# Patient Record
Sex: Male | Born: 1960 | Hispanic: No | Marital: Single | Smoking: Never smoker
Health system: Southern US, Community
[De-identification: ages and names within clinical notes are randomized; demographics above are authoritative.]

## PROBLEM LIST (undated history)

## (undated) DIAGNOSIS — I519 Heart disease, unspecified: Secondary | ICD-10-CM

## (undated) DIAGNOSIS — I10 Essential (primary) hypertension: Secondary | ICD-10-CM

## (undated) DIAGNOSIS — M1990 Unspecified osteoarthritis, unspecified site: Secondary | ICD-10-CM

## (undated) DIAGNOSIS — E119 Type 2 diabetes mellitus without complications: Secondary | ICD-10-CM

## (undated) DIAGNOSIS — I5032 Chronic diastolic (congestive) heart failure: Secondary | ICD-10-CM

## (undated) DIAGNOSIS — Z87440 Personal history of urinary (tract) infections: Secondary | ICD-10-CM

## (undated) DIAGNOSIS — F0390 Unspecified dementia without behavioral disturbance: Secondary | ICD-10-CM

## (undated) HISTORY — DX: Unspecified osteoarthritis, unspecified site: M19.90

## (undated) HISTORY — DX: Heart disease, unspecified: I51.9

## (undated) HISTORY — PX: CORONARY ARTERY BYPASS GRAFT: SHX141

---

## 2020-12-01 ENCOUNTER — Other Ambulatory Visit: Payer: Self-pay

## 2020-12-01 ENCOUNTER — Ambulatory Visit
Admission: EM | Admit: 2020-12-01 | Discharge: 2020-12-01 | Disposition: A | Discharge status: Home / Self Care | Payer: BC Managed Care – PPO

## 2020-12-01 ENCOUNTER — Encounter: Payer: Self-pay | Admitting: Emergency Medicine

## 2020-12-01 ENCOUNTER — Encounter (HOSPITAL_BASED_OUTPATIENT_CLINIC_OR_DEPARTMENT_OTHER): Payer: Self-pay | Admitting: *Deleted

## 2020-12-01 ENCOUNTER — Emergency Department (HOSPITAL_BASED_OUTPATIENT_CLINIC_OR_DEPARTMENT_OTHER)
Admission: EM | Admit: 2020-12-01 | Discharge: 2020-12-01 | Disposition: A | Discharge status: Home / Self Care | Payer: BC Managed Care – PPO | Attending: Emergency Medicine | Admitting: Emergency Medicine

## 2020-12-01 DIAGNOSIS — N4889 Other specified disorders of penis: Secondary | ICD-10-CM

## 2020-12-01 DIAGNOSIS — E1165 Type 2 diabetes mellitus with hyperglycemia: Secondary | ICD-10-CM | POA: Insufficient documentation

## 2020-12-01 DIAGNOSIS — Z951 Presence of aortocoronary bypass graft: Secondary | ICD-10-CM | POA: Diagnosis not present

## 2020-12-01 DIAGNOSIS — N471 Phimosis: Secondary | ICD-10-CM | POA: Diagnosis not present

## 2020-12-01 DIAGNOSIS — I10 Essential (primary) hypertension: Secondary | ICD-10-CM | POA: Insufficient documentation

## 2020-12-01 DIAGNOSIS — R739 Hyperglycemia, unspecified: Secondary | ICD-10-CM

## 2020-12-01 DIAGNOSIS — R369 Urethral discharge, unspecified: Secondary | ICD-10-CM | POA: Diagnosis present

## 2020-12-01 DIAGNOSIS — N481 Balanitis: Secondary | ICD-10-CM

## 2020-12-01 HISTORY — DX: Type 2 diabetes mellitus without complications: E11.9

## 2020-12-01 HISTORY — DX: Essential (primary) hypertension: I10

## 2020-12-01 LAB — I-STAT VENOUS BLOOD GAS, ED
Acid-Base Excess: 2 mmol/L (ref 0.0–2.0)
Bicarbonate: 28.1 mmol/L — ABNORMAL HIGH (ref 20.0–28.0)
Calcium, Ion: 1.28 mmol/L (ref 1.15–1.40)
HCT: 40 % (ref 39.0–52.0)
Hemoglobin: 13.6 g/dL (ref 13.0–17.0)
O2 Saturation: 33 %
Potassium: 4.4 mmol/L (ref 3.5–5.1)
Sodium: 132 mmol/L — ABNORMAL LOW (ref 135–145)
TCO2: 30 mmol/L (ref 22–32)
pCO2, Ven: 47.7 mmHg (ref 44.0–60.0)
pH, Ven: 7.379 (ref 7.250–7.430)
pO2, Ven: 21 mmHg — CL (ref 32.0–45.0)

## 2020-12-01 LAB — URINALYSIS, ROUTINE W REFLEX MICROSCOPIC
Bilirubin Urine: NEGATIVE
Glucose, UA: >1000 mg/dL — AB
Hgb urine dipstick: NEGATIVE
Ketones, ur: 40 mg/dL — AB
Nitrite: NEGATIVE
Protein, ur: NEGATIVE mg/dL
Specific Gravity, Urine: 1.037 — ABNORMAL HIGH (ref 1.005–1.030)
pH: 5.5 (ref 5.0–8.0)

## 2020-12-01 LAB — BASIC METABOLIC PANEL
Anion gap: 16 — ABNORMAL HIGH (ref 5–15)
BUN: 16 mg/dL (ref 6–20)
CO2: 24 mmol/L (ref 22–32)
Calcium: 10.3 mg/dL (ref 8.9–10.3)
Chloride: 91 mmol/L — ABNORMAL LOW (ref 98–111)
Creatinine, Ser: 0.96 mg/dL (ref 0.61–1.24)
GFR, Estimated: >60 mL/min (ref >60)
Glucose, Bld: 682 mg/dL (ref 70–99)
Potassium: 4.3 mmol/L (ref 3.5–5.1)
Sodium: 131 mmol/L — ABNORMAL LOW (ref 135–145)

## 2020-12-01 LAB — CBC
HCT: 39.4 % (ref 39.0–52.0)
Hemoglobin: 14 g/dL (ref 13.0–17.0)
MCH: 31.9 pg (ref 26.0–34.0)
MCHC: 35.5 g/dL (ref 30.0–36.0)
MCV: 89.7 fL (ref 80.0–100.0)
Platelets: 205 10*3/uL (ref 150–400)
RBC: 4.39 MIL/uL (ref 4.22–5.81)
RDW: 11.8 % (ref 11.5–15.5)
WBC: 6.7 10*3/uL (ref 4.0–10.5)
nRBC: 0 % (ref 0.0–0.2)

## 2020-12-01 LAB — CBG MONITORING, ED
Glucose-Capillary: >600 mg/dL (ref 70–99)
Glucose-Capillary: 557 mg/dL (ref 70–99)
Glucose-Capillary: 449 mg/dL — ABNORMAL HIGH (ref 70–99)
Glucose-Capillary: 385 mg/dL — ABNORMAL HIGH (ref 70–99)
Glucose-Capillary: 233 mg/dL — ABNORMAL HIGH (ref 70–99)
Glucose-Capillary: 169 mg/dL — ABNORMAL HIGH (ref 70–99)
Glucose-Capillary: 176 mg/dL — ABNORMAL HIGH (ref 70–99)
Glucose-Capillary: 301 mg/dL — ABNORMAL HIGH (ref 70–99)
Glucose-Capillary: 167 mg/dL — ABNORMAL HIGH (ref 70–99)

## 2020-12-01 MED ORDER — MUPIROCIN CALCIUM 2 % EX CREA: 1.0000 "application " | TOPICAL_CREAM | Freq: Two times a day (BID) | CUTANEOUS | 0 refills | Status: DC

## 2020-12-01 MED ORDER — DEXTROSE IN LACTATED RINGERS 5 % IV SOLN: INTRAVENOUS | Status: DC

## 2020-12-01 MED ORDER — DEXTROSE 50 % IV SOLN: 0.0000 mL | INTRAVENOUS | Status: DC | PRN

## 2020-12-01 MED ORDER — INSULIN REGULAR(HUMAN) IN NACL 100-0.9 UT/100ML-% IV SOLN
INTRAVENOUS | Status: DC
  Administered 2020-12-01: 11.5 [IU]/h via INTRAVENOUS
  Filled 2020-12-01: qty 100

## 2020-12-01 MED ORDER — LACTATED RINGERS IV BOLUS
20.0000 mL/kg | Freq: Once | INTRAVENOUS | Status: AC
  Administered 2020-12-01: 1070 mL via INTRAVENOUS

## 2020-12-01 MED ORDER — LACTATED RINGERS IV SOLN: INTRAVENOUS | Status: DC

## 2020-12-01 MED ORDER — INSULIN REGULAR HUMAN 100 UNIT/ML IJ SOLN
5.0000 [IU] | Freq: Once | INTRAMUSCULAR | Status: DC
  Filled 2020-12-01: qty 3

## 2020-12-01 MED ORDER — INSULIN ASPART PROT & ASPART (70-30 MIX) 100 UNIT/ML ~~LOC~~ SUSP
5.0000 [IU] | Freq: Once | SUBCUTANEOUS | Status: AC
  Administered 2020-12-01: 5 [IU] via SUBCUTANEOUS

## 2020-12-01 MED ORDER — NYSTATIN 100000 UNIT/GM EX CREA: TOPICAL_CREAM | CUTANEOUS | 0 refills | Status: DC

## 2020-12-01 MED ORDER — FLUCONAZOLE 150 MG PO TABS
150.0000 mg | ORAL_TABLET | Freq: Once | ORAL | Status: AC
  Administered 2020-12-01: 150 mg via ORAL
  Filled 2020-12-01: qty 1

## 2020-12-01 NOTE — ED Provider Notes (Signed)
Chief Complaint   Chief Complaint  Patient presents with   Rash     Subjective, HPI  Charles [name] is a very pleasant 60 y.o. male who presents with rash to groin region that started about 2 months ago.  Patient has reported a milky substance from his foreskin.  Patient states that he went to the pharmacy and was told that this was related to his diabetes and to follow-up with a provider.  Patient reports that he is new to the area and has not established himself with a PCP.  No fever, chills, vomiting.  Patient reports taking himself off of all of his medications.  History obtained from patient.   Patient's problem list, past medical and social history, medications, and allergies were reviewed by me and updated in Epic.    ROS  See HPI.  Objective   Vitals:   12/01/20 1114  BP: 127/80  Pulse: 98  Resp: (!) 22  Temp: 99.1 F (37.3 C)  SpO2: 100%    Vital signs and nursing note reviewed.  General: Appears well-developed and well-nourished. No acute distress.  HEENT: Normocephalic, atraumatic, hearing grossly intact. EOMI, no drainage. Neck: Normal range of motion, neck is supple.  Cardiovascular: Normal rate.  Pulm/Chest: No respiratory distress.   Musculoskeletal: No joint deformity, normal range of motion.  Skin: No rash noted to groin region or penis. GU: Head of penis severely enlarged and unable to retract foreskin.  There is a white creamy drainage to foreskin region.  No tenderness to palpation of head of penis, scrotum, testicles.  Data  No results found for any visits on 12/01/20.   Assessment & Plan  1. Penile swelling  2. Penile pain  60 y.o. male presents with rash to groin region that started about 2 months ago.  Patient has reported a milky substance from his foreskin.  Patient states that he went to the pharmacy and was told that this was related to his diabetes and to follow-up with a provider.  Patient reports that he is new to the area and has not  established himself with a PCP.  No fever, chills, vomiting.  Patient reports taking himself off of all of his medications.  Given appearance of penis, there is concern that the patient will need further work-up in the emergency department and imaging.  Did explain to the patient that with the swelling of the head of his penis there is concerned that at some point he may have issues with urination if he does not get this area evaluated.  Patient reports that he will present to the emergency department.  Patient is new to the area, so did establish with PCP in clinic today and will be seen on October 13th at 9:50 am.  Stable on discharge.  Return as needed.  Patient verbalized understanding and agreed with plan.  Plan:   Discharge Instructions      Your current condition warrants further evaluation and/or treatment which exceed services available to you in this urgent care setting. I have discussed with you your currrent condition and the need for further evaluation and/or treatment in an emergency department setting. In response to my medical recommendation, you have opted to go to the emergency department.         Amalia Greenhouse, [place] 12/01/20 1203

## 2020-12-01 NOTE — ED Triage Notes (Signed)
Pt sent from UC in [place] due to discharge from foreskin, was told to come here, Pt is diabetic uncontrolled, he thinks all of this is related.

## 2020-12-01 NOTE — ED Notes (Signed)
BS 301 was made in error; BS done on side of IV fluids (D5%) with fluids running.  Lawsing MD aware.

## 2020-12-01 NOTE — ED Provider Notes (Signed)
   MDM    60 male DM2, stopped taking medications 6 months ago. Here with balanitis and phimosis. Also hyperglycemic.  The patient has been off his oral intake hyperglycemic medications and injections for the past 6 months.  He denies any fever or chills.  His only infectious symptoms are a concern for balanitis with penile discharge.  Please see the prior ED provider's note for previous MDM.  The patient initially presented hyperglycemic with blood sugars in the 600s, no evidence of DKA, improved on an insulin drip and following IV fluids.  Plan at time of signout to reassess the patient following glycemic control, discharged with plan for treatment for balanitis.  The patient was successfully weaned off of his insulin GTT and transitioned over to subcutaneous insulin.  He was tolerating oral intake and had a meal with no return in his hyperglycemia.  Blood glucose on recheck was 167.  I strongly encouraged the patient to follow-up with a primary physician to reestablish care and restart his home insulin regimen outpatient.  Provided prescription for Bactroban and nystatin cream in the setting of his balanitis.  Overall stable for discharge home.       Ernie Avena, MD 12/01/20 2009

## 2020-12-01 NOTE — Discharge Instructions (Addendum)
You need to follow-up with a primary physician to initiate insulin therapy given your uncontrolled blood sugar today.

## 2020-12-01 NOTE — ED Triage Notes (Signed)
Noticed rash in private area 2 months ago, didn't think much about it.  Then noticed a milky substance from foreskin.  Patient did go to a pharmacy and was told this related to diabetes and told to see a doctor.  Patient is new to area.  Has not established pcp.    Patient has concerns for balance.  Noticed the balance issues 6 months ago.  Walks with a cane.

## 2020-12-01 NOTE — ED Notes (Signed)
At bedside for kristin, np exam of patient

## 2020-12-01 NOTE — ED Notes (Signed)
Pt requested recheck of BS for 2nd glucose.

## 2020-12-01 NOTE — Discharge Instructions (Addendum)
Your current condition warrants further evaluation and/or treatment which exceed services available to you in this urgent care setting. I have discussed with you your currrent condition and the need for further evaluation and/or treatment in an emergency department setting. In response to my medical recommendation, you have opted to go to the emergency department. 

## 2020-12-01 NOTE — ED Provider Notes (Signed)
[facility] EMERGENCY DEPT Provider Note   CSN: [id number] Arrival date & time: 12/01/20  1232     History Chief Complaint  Patient presents with   Penile Discharge   Hyperglycemia    Charles [name] is a 60 y.o. male.   Penile Discharge  Hyperglycemia  States he has been having some issues with a white discharge from the foreskin of his penis.  It is very itchy.  This has been going on for couple weeks now.  He is not having any pain.  No fevers or chills.  Patient went to an urgent care who thought he should be evaluated in the ED.  They noted that they were unable to retract his foreskin properly.  Patient states he does have history of diabetes.  He has not been taking any of his medications for at least the last 6 months.  Patient used to be on oral medications and injections.  Patient denies any trouble with fevers or chills.  No vomiting or diarrhea.  He has noticed polyuria polydipsia.  He has also lost weight in these last several months  Past Medical History:  Diagnosis Date   Diabetes mellitus without complication (HCC)    Hypertension     There are no problems to display for this patient.   Past Surgical History:  Procedure Laterality Date   CORONARY ARTERY BYPASS GRAFT         No family history on file.  Social History   Tobacco Use   Smoking status: Never   Smokeless tobacco: Never  Vaping Use   Vaping Use: Never used  Substance Use Topics   Alcohol use: Not Currently   Drug use: Never    Home Medications Prior to Admission medications   Medication Sig Start Date End Date Taking? Authorizing Provider  mupirocin cream (BACTROBAN) 2 % Apply 1 application topically 2 (two) times daily. Apply twice daily to the penis 12/01/20  Yes Ernie Avena, MD  nystatin cream (MYCOSTATIN) Apply to affected area 2 times daily to the penis 12/01/20  Yes Ernie Avena, MD    Allergies    Patient has no known allergies.  Review of Systems    Review of Systems  Genitourinary:  Positive for penile discharge.  All other systems reviewed and are negative.  Physical Exam Updated Vital Signs BP 114/70   Pulse 78   Temp 98.3 F (36.8 C) (Oral)   Resp 18   Ht 1.676 m (5\' 6" )   Wt 53.5 kg   SpO2 98%   BMI 19.05 kg/m   Physical Exam Vitals and nursing note reviewed.  Constitutional:      General: He is not in acute distress.    Appearance: He is well-developed.  HENT:     Head: Normocephalic and atraumatic.     Right Ear: External ear normal.     Left Ear: External ear normal.  Eyes:     General: No scleral icterus.       Right eye: No discharge.        Left eye: No discharge.     Conjunctiva/sclera: Conjunctivae normal.  Neck:     Trachea: No tracheal deviation.  Cardiovascular:     Rate and Rhythm: Normal rate and regular rhythm.  Pulmonary:     Effort: Pulmonary effort is normal. No respiratory distress.     Breath sounds: Normal breath sounds. No stridor. No wheezing or rales.  Abdominal:     General: Bowel sounds are normal. There  is no distension.     Palpations: Abdomen is soft.     Tenderness: There is no abdominal tenderness. There is no guarding or rebound.  Genitourinary:    Comments: White discharge noted around the glans of the penis and the foreskin, no erythema, no induration, unable to retract foreskin properly Musculoskeletal:        General: No tenderness or deformity.     Cervical back: Neck supple.  Skin:    General: Skin is warm and dry.     Findings: No rash.  Neurological:     General: No focal deficit present.     Mental Status: He is alert.     Cranial Nerves: No cranial nerve deficit (no facial droop, extraocular movements intact, no slurred speech).     Sensory: No sensory deficit.     Motor: No abnormal muscle tone or seizure activity.     Coordination: Coordination normal.  Psychiatric:        Mood and Affect: Mood normal.    ED Results / Procedures / Treatments    Labs (all labs ordered are listed, but only abnormal results are displayed) Labs Reviewed  BASIC METABOLIC PANEL - Abnormal; Notable for the following components:      Result Value   Sodium 131 (*)    Chloride 91 (*)    Glucose, Bld 682 (*)    Anion gap 16 (*)    All other components within normal limits  URINALYSIS, ROUTINE W REFLEX MICROSCOPIC - Abnormal; Notable for the following components:   Color, Urine COLORLESS (*)    Specific Gravity, Urine 1.037 (*)    Glucose, UA >1,000 (*)    Ketones, ur 40 (*)    Leukocytes,Ua LARGE (*)    All other components within normal limits  CBG MONITORING, ED - Abnormal; Notable for the following components:   Glucose-Capillary >600 (*)    All other components within normal limits  CBG MONITORING, ED - Abnormal; Notable for the following components:   Glucose-Capillary 557 (*)    All other components within normal limits  I-STAT VENOUS BLOOD GAS, ED - Abnormal; Notable for the following components:   pO2, Ven 21.0 (*)    Bicarbonate 28.1 (*)    Sodium 132 (*)    All other components within normal limits  CBG MONITORING, ED - Abnormal; Notable for the following components:   Glucose-Capillary 449 (*)    All other components within normal limits  CBG MONITORING, ED - Abnormal; Notable for the following components:   Glucose-Capillary 385 (*)    All other components within normal limits  CBC  CBG MONITORING, ED    EKG None  Radiology No results found.  Procedures Procedures   Medications Ordered in ED Medications  insulin regular, human (MYXREDLIN) 100 units/ 100 mL infusion (10 Units/hr Intravenous Rate/Dose Change 12/01/20 1541)  dextrose 50 % solution 0-50 mL (has no administration in time range)  lactated ringers infusion ( Intravenous New Bag/Given 12/01/20 1524)  lactated ringers bolus 1,070 mL (0 mLs Intravenous Stopped 12/01/20 1502)  fluconazole (DIFLUCAN) tablet 150 mg (150 mg Oral Given 12/01/20 1346)    ED Course  I  have reviewed the triage vital signs and the nursing notes.  Pertinent labs & imaging results that were available during my care of the patient were reviewed by me and considered in my medical decision making (see chart for details).  Clinical Course as of 12/01/20 1636  Wed Dec 01, 2020  1450 Venous  blood gas without signs of acidosis [JK]    Clinical Course User Index [JK] Linwood Dibbles, MD   MDM Rules/Calculators/A&P                           Pt with symptomatic hyperglycemia.  Labs without signs of acidosis.  Pt alert and oriented.  Doubt DKA, hyperosmolar syndrome.  Balanitis and phimosis on exam.  Plan on treatment with antifungal.  Stressed importance of taking his diabetes meds.  Insulin and IV fluids to treat his hyperglycemia. Improving with treatment.  Plan on outpatient management.  UA pending.  Care turned over to Dr Karene Fry Final Clinical Impression(s) / ED Diagnoses Final diagnoses:  Balanitis  Phimosis of penis  Hyperglycemia    Rx / DC Orders ED Discharge Orders          Ordered    mupirocin cream (BACTROBAN) 2 %  2 times daily        12/01/20 1612    nystatin cream (MYCOSTATIN)        12/01/20 1612             Linwood Dibbles, MD 12/01/20 1636

## 2020-12-01 NOTE — ED Notes (Signed)
Oral intake given per v.o. Lawsing MD.

## 2020-12-01 NOTE — ED Notes (Signed)
Urinal given; pt aware awaiting UA for lab work.

## 2020-12-23 ENCOUNTER — Other Ambulatory Visit: Payer: Self-pay

## 2020-12-23 ENCOUNTER — Ambulatory Visit (INDEPENDENT_AMBULATORY_CARE_PROVIDER_SITE_OTHER): Payer: Managed Care, Other (non HMO) | Admitting: Family Medicine

## 2020-12-23 VITALS — BP 112/7 | HR 104 | Ht 66.0 in | Wt 115.6 lb

## 2020-12-23 DIAGNOSIS — E1165 Type 2 diabetes mellitus with hyperglycemia: Secondary | ICD-10-CM

## 2020-12-23 DIAGNOSIS — Z1211 Encounter for screening for malignant neoplasm of colon: Secondary | ICD-10-CM

## 2020-12-23 DIAGNOSIS — I10 Essential (primary) hypertension: Secondary | ICD-10-CM | POA: Diagnosis not present

## 2020-12-23 DIAGNOSIS — E119 Type 2 diabetes mellitus without complications: Secondary | ICD-10-CM | POA: Insufficient documentation

## 2020-12-23 DIAGNOSIS — I2510 Atherosclerotic heart disease of native coronary artery without angina pectoris: Secondary | ICD-10-CM | POA: Diagnosis not present

## 2020-12-23 MED ORDER — BLOOD GLUCOSE METER KIT
PACK | 11 refills | Status: AC
Start: 2020-12-23 — End: ?

## 2020-12-23 MED ORDER — METFORMIN HCL ER 500 MG PO TB24: 1000.0000 mg | ORAL_TABLET | Freq: Every day | ORAL | 2 refills | Status: DC

## 2020-12-23 NOTE — Progress Notes (Signed)
New Patient Office Visit  Subjective:  Patient ID: Charles [name], male    DOB: [birth year]  Age: 60 y.o. MRN: [id number]  CC:  Chief Complaint  Patient presents with   Establish Care    Prior PCP - [place] [facility]   Hypertension    Patient has longstanding hx of HTN. He states been out of all his medication for about a year and a half due to losing employment and insurance. He has not ought any medical care in the interim.    Diabetes    Patient has longstanding hx of DM. He states been out of all his medication for about a year and a half due to losing employment and insurance. He has not ought any medical care in the interim.He was on a short acting and long acting insulin in the past but states at one point he was switched to metformin ER. He is unaware of any of the most recent medications he was prescribed.     HPI Charles [name] is a 60 year old male presenting to establish clinic.  He has current concerns as outlined above.  Past medical history significant for diabetes, hypertension, CAD status post bypass surgery in 2003.  Hypertension: Unsure of any medications he had been on in the past.  Denies any issues currently with chest pain, headaches, lightheadedness or dizziness  Diabetes: Reports being managed on various medication in the past including short acting and long-acting insulin.  Has not taken any medications for about the past year and a half.  Did present to the emergency department recently and was diagnosed with balanitis which is likely due to uncontrolled blood sugars as he was found to have significantly elevated blood sugar in the emergency department and was placed on insulin drip.  Was not diagnosed as DKA.  After the emergency room visit, he did resume low-dose of metformin that he had at home.  CAD: Reports having bypass surgery in 2003.  Denies any chest pain at the time, but was having progressively worsening exertional shortness of breath and thus had  catheterization done and found to have significant blockages in coronary arteries.  Patient is currently not working.  He indicates plans to try to obtain disability.  Past Medical History:  Diagnosis Date   Diabetes mellitus without complication ([place])    Hypertension     Past Surgical History:  Procedure Laterality Date   CORONARY ARTERY BYPASS GRAFT      No family history on file.  Social History   Socioeconomic History   Marital status: Single    Spouse name: Not on file   Number of children: Not on file   Years of education: Not on file   Highest education level: Not on file  Occupational History   Not on file  Tobacco Use   Smoking status: Never   Smokeless tobacco: Never  Vaping Use   Vaping Use: Never used  Substance and Sexual Activity   Alcohol use: Not Currently   Drug use: Never   Sexual activity: Not on file  Other Topics Concern   Not on file  Social History Narrative   Not on file   Social Determinants of Health   Financial Resource Strain: Not on file  Food Insecurity: Not on file  Transportation Needs: Not on file  Physical Activity: Not on file  Stress: Not on file  Social Connections: Not on file  Intimate Partner Violence: Not on file    Objective:   Today's Vitals:  BP (!) 112/7   Pulse (!) 104   Ht $R'5\' 6"'Ed$  (1.676 m)   Wt 115 lb 9.6 oz (52.4 kg)   SpO2 100%   BMI 18.66 kg/m   Physical Exam  60 year old male in no acute distress Cardiovascular exam with regular rate and rhythm Lungs clear to auscultation bilaterally  Assessment & Plan:   Problem List Items Addressed This Visit       Cardiovascular and Mediastinum   Hypertension    Blood pressure within normal range in office today Patient has had notable weight loss in recent time, suspect this is due to poorly controlled diabetes Likely given underlying CAD, diabetes, patient may benefit from addition of agents such as ARB.  We will need to check labs, monitor blood  pressure once medications are started No additional blood pressure medications to be started today Recommend checking blood pressure at home, adhering to DASH diet      Relevant Orders   Ambulatory referral to Cardiology   Basic metabolic panel (Completed)   Lipid panel (Completed)   CAD (coronary artery disease)    History of quadruple bypass surgery in 2003 Has been off of medications for about 1-1/2 years, cannot recall medications that he supposed to be on At time of diagnosis requiring bypass surgery in the past, he was not having any chest pain, but was having progressive dyspnea, notably with exertion He is not currently having any issues with chest pain, however is having some shortness of breath with exertion over the past year Given patient's medical history, lack of appropriate medical care, current symptoms, will refer to cardiology for further evaluation and management      Relevant Orders   Ambulatory referral to Cardiology   Basic metabolic panel (Completed)   Lipid panel (Completed)     Endocrine   Diabetes ([place])    Has been without medications for a year and a half Clinic Emergency Department for issues of balanitis and findings of significantly elevated blood sugars without diagnosis of DKA Did resume low-dose of metformin the patient had at home We will titrate dose of metformin today Check labs today Likely will need to be placed on additional medication, consider insulin glargine pending results of A1c Will also refer to endocrinology      Relevant Medications   metFORMIN (GLUCOPHAGE-XR) 500 MG 24 hr tablet   blood glucose meter kit and supplies   Other Relevant Orders   Ambulatory referral to Endocrinology   Hemoglobin A1c (Completed)   Other Visit Diagnoses     Colon cancer screening    -  Primary   Relevant Orders   Ambulatory referral to Gastroenterology     Patient has had some falls in the past year.  Will need to continue to assess, consider  referral to physical therapy in the future for assessment and management of gait and stability  Outpatient Encounter Medications as of 12/23/2020  Medication Sig   blood glucose meter kit and supplies Dispense based on patient and insurance preference. Use up to four times daily as directed. (FOR ICD-10 E10.9, E11.9).   metFORMIN (GLUCOPHAGE-XR) 500 MG 24 hr tablet Take 2 tablets (1,000 mg total) by mouth daily with breakfast.   mupirocin cream (BACTROBAN) 2 % Apply 1 application topically 2 (two) times daily. Apply twice daily to the penis   nystatin cream (MYCOSTATIN) Apply to affected area 2 times daily to the penis   No facility-administered encounter medications on file as of 12/23/2020.   Spent  45 minutes on this patient encounter, including preparation, chart review, face-to-face counseling with patient and coordination of care, and documentation of encounter  Follow-up: Return in about 2 weeks (around 01/06/2021) for Follow Up.  Plan for close follow-up due to need for adequate control of chronic medical issues in order to lower risk of complication from uncontrolled conditions  Gabriella Guile J [place], MD

## 2020-12-23 NOTE — Patient Instructions (Addendum)
  Medication Instructions:  Your physician recommends that you continue on your current medications as directed. Please refer to the Current Medication list given to you today. --If you need a refill on any your medications before your next appointment, please call your pharmacy first. If no refills are authorized on file call the office.-- Lab Work: Your physician has recommended that you have lab work today: BMP, Lipid, And A1C If you have labs (blood work) drawn today and your tests are completely normal, you will receive your results via MyChart message OR a phone call from our staff.  Please ensure you check your voicemail in the event that you authorized detailed messages to be left on a delegated number. If you have any lab test that is abnormal or we need to change your treatment, we will call you to review the results.  Referrals/Procedures/Imaging: A referral has been placed for you to [facility] for evaluation and treatment. Someone from the scheduling department will be in contact with you in regards to coordinating your consultation. If you do not hear from any of the schedulers within 7-10 business days please give their office a call.  A referral has been placed for you to [facility] for a colonoscopy. Someone from the scheduling department will be in contact with you in regards to coordinating your consultation. If you do not hear from any of the schedulers within 7-10 business days please give their office a call.  A referral has been placed for you to Endocrinology for evaluation and treatment. Someone from the scheduling department will be in contact with you in regards to coordinating your consultation. If you do not hear from any of the schedulers within 7-10 business days please give their office a call.  Follow-Up: Your next appointment:   Your physician recommends that you schedule a follow-up appointment in: 3-4 WEEKS with Dr. de [place]  You will receive a text  message or e-mail with a link to a survey about your care and experience with [place] today! We would greatly appreciate your feedback!   Thanks for letting us be apart of your health journey!!  Primary Care and Sports Medicine   Dr. Ceasar Mons [place]   We encourage you to activate your patient portal called "MyChart".  Sign up information is provided on this After Visit Summary.  MyChart is used to connect with patients for Virtual Visits (Telemedicine).  Patients are able to view lab/test results, encounter notes, upcoming appointments, etc.  Non-urgent messages can be sent to your provider as well. To learn more about what you can do with MyChart, please visit --  [email or web address].

## 2020-12-24 LAB — BASIC METABOLIC PANEL
BUN/Creatinine Ratio: 16 (ref 9–20)
BUN: 14 mg/dL (ref 6–24)
CO2: 14 mmol/L — ABNORMAL LOW (ref 20–29)
Calcium: 9.5 mg/dL (ref 8.7–10.2)
Chloride: 98 mmol/L (ref 96–106)
Creatinine, Ser: 0.9 mg/dL (ref 0.76–1.27)
Glucose: 360 mg/dL — ABNORMAL HIGH (ref 70–99)
Potassium: 4.6 mmol/L (ref 3.5–5.2)
Sodium: 135 mmol/L (ref 134–144)
eGFR: 100 mL/min/{1.73_m2} (ref >59)

## 2020-12-24 LAB — LIPID PANEL
Chol/HDL Ratio: 7.1 ratio — ABNORMAL HIGH (ref 0.0–5.0)
Cholesterol, Total: 328 mg/dL — ABNORMAL HIGH (ref 100–199)
HDL: 46 mg/dL (ref >39)
LDL Chol Calc (NIH): 231 mg/dL — ABNORMAL HIGH (ref 0–99)
Triglycerides: 251 mg/dL — ABNORMAL HIGH (ref 0–149)
VLDL Cholesterol Cal: 51 mg/dL — ABNORMAL HIGH (ref 5–40)

## 2020-12-24 LAB — HEMOGLOBIN A1C
Est. average glucose Bld gHb Est-mCnc: >398 mg/dL
Hgb A1c MFr Bld: >15.5 % — ABNORMAL HIGH (ref 4.8–5.6)

## 2020-12-24 NOTE — Assessment & Plan Note (Signed)
Blood pressure within normal range in office today Patient has had notable weight loss in recent time, suspect this is due to poorly controlled diabetes Likely given underlying CAD, diabetes, patient may benefit from addition of agents such as ARB.  We will need to check labs, monitor blood pressure once medications are started No additional blood pressure medications to be started today Recommend checking blood pressure at home, adhering to DASH diet

## 2020-12-24 NOTE — Assessment & Plan Note (Addendum)
Has been without medications for a year and a half Clinic Emergency Department for issues of balanitis and findings of significantly elevated blood sugars without diagnosis of DKA Did resume low-dose of metformin the patient had at home We will titrate dose of metformin today Check labs today Likely will need to be placed on additional medication, consider insulin glargine pending results of A1c Will also refer to endocrinology

## 2020-12-24 NOTE — Assessment & Plan Note (Signed)
History of quadruple bypass surgery in 2003 Has been off of medications for about 1-1/2 years, cannot recall medications that he supposed to be on At time of diagnosis requiring bypass surgery in the past, he was not having any chest pain, but was having progressive dyspnea, notably with exertion He is not currently having any issues with chest pain, however is having some shortness of breath with exertion over the past year Given patient's medical history, lack of appropriate medical care, current symptoms, will refer to cardiology for further evaluation and management

## 2020-12-27 ENCOUNTER — Telehealth (HOSPITAL_BASED_OUTPATIENT_CLINIC_OR_DEPARTMENT_OTHER): Payer: Self-pay

## 2020-12-27 MED ORDER — BASAGLAR KWIKPEN 100 UNIT/ML ~~LOC~~ SOPN: 10.0000 [IU] | PEN_INJECTOR | Freq: Every day | SUBCUTANEOUS | 1 refills | Status: DC

## 2020-12-27 NOTE — Telephone Encounter (Signed)
Results released by Dr. de Peru and reviewed by patient via MyChart Instructed patient to contact the office with any questions or concerns.  MyChart Message sent to patient informing patient that insulin has been sent and instructions on how to titrate insulin until blood sugars are optimized

## 2020-12-27 NOTE — Telephone Encounter (Signed)
-----   Message from Hosie Poisson Peru, MD sent at 12/24/2020  2:13 PM EDT ----- Blood sugar elevated on labs.  Kidney function and electrolytes otherwise normal.  Cholesterol panel with notable elevation in total cholesterol as well as "bad" cholesterol.  This will require treatment with statin medication to help with lowering cholesterol.  Hemoglobin A1c significantly elevated.  This elevation indicates very high blood sugar readings on average over the past 3 months.  Due to this A1c level, would recommend initiating long-acting insulin.  This long-acting insulin can be taken once daily and recommend checking morning fasting blood sugar daily to monitor for improvement in fasting blood sugar readings with use of long-acting insulin.  We will begin with Lantus 10 units once daily and monitor response with fasting blood sugar readings.

## 2020-12-29 ENCOUNTER — Encounter: Payer: Self-pay | Admitting: Gastroenterology

## 2020-12-30 ENCOUNTER — Encounter (HOSPITAL_BASED_OUTPATIENT_CLINIC_OR_DEPARTMENT_OTHER): Payer: Self-pay | Admitting: Family Medicine

## 2021-01-05 ENCOUNTER — Encounter (HOSPITAL_BASED_OUTPATIENT_CLINIC_OR_DEPARTMENT_OTHER): Payer: Self-pay | Admitting: Family Medicine

## 2021-01-05 MED ORDER — BASAGLAR KWIKPEN 100 UNIT/ML ~~LOC~~ SOPN: 10.0000 [IU] | PEN_INJECTOR | Freq: Every day | SUBCUTANEOUS | 1 refills | Status: DC

## 2021-01-07 ENCOUNTER — Other Ambulatory Visit (HOSPITAL_BASED_OUTPATIENT_CLINIC_OR_DEPARTMENT_OTHER): Payer: Self-pay

## 2021-01-20 ENCOUNTER — Ambulatory Visit (HOSPITAL_BASED_OUTPATIENT_CLINIC_OR_DEPARTMENT_OTHER): Payer: Managed Care, Other (non HMO) | Admitting: Family Medicine

## 2021-01-21 ENCOUNTER — Encounter: Payer: Self-pay | Admitting: Gastroenterology

## 2021-01-21 ENCOUNTER — Ambulatory Visit (INDEPENDENT_AMBULATORY_CARE_PROVIDER_SITE_OTHER): Payer: Managed Care, Other (non HMO) | Admitting: Gastroenterology

## 2021-01-21 VITALS — BP 116/70 | HR 88 | Ht 66.0 in | Wt 131.5 lb

## 2021-01-21 DIAGNOSIS — R140 Abdominal distension (gaseous): Secondary | ICD-10-CM

## 2021-01-21 DIAGNOSIS — Z1211 Encounter for screening for malignant neoplasm of colon: Secondary | ICD-10-CM

## 2021-01-21 DIAGNOSIS — R634 Abnormal weight loss: Secondary | ICD-10-CM | POA: Diagnosis not present

## 2021-01-21 DIAGNOSIS — K649 Unspecified hemorrhoids: Secondary | ICD-10-CM | POA: Diagnosis not present

## 2021-01-21 DIAGNOSIS — Z1212 Encounter for screening for malignant neoplasm of rectum: Secondary | ICD-10-CM

## 2021-01-21 NOTE — Patient Instructions (Addendum)
If you are age 58 or older, your body mass index should be between 23-30. Your Body mass index is 21.22 kg/m. If this is out of the aforementioned range listed, please consider follow up with your Primary Care Provider.  If you are age 32 or younger, your body mass index should be between 19-25. Your Body mass index is 21.22 kg/m. If this is out of the aformentioned range listed, please consider follow up with your Primary Care Provider.   Try over the counter Align probiotic and Gas-X.  Follow up in February .   The [place] [facility] providers would like to encourage you to use [facility] to communicate with providers for non-urgent requests or questions.  Due to long hold times on the telephone, sending your provider a message by [facility] may be a faster and more efficient way to get a response.  Please allow 48 business hours for a response.  Please remember that this is for non-urgent requests.   It was a pleasure to see you today!  Thank you for trusting me with your gastrointestinal care!    Scott E.Tomasa Rand, MD

## 2021-01-21 NOTE — Progress Notes (Signed)
HPI : Charles [name] is a very pleasant 60 year old male with a history of coronary artery disease status post CABG and poorly controlled diabetes.  The patient had been without medical insurance and so had been removed from medical care on no medications for over a year and a half.  He presented to the ER in September with bowel otitis, and was found to have blood sugar in the 600s.  He was placed on insulin drip, but was not hospitalized and not felt to have DKA.  He reestablished care with a primary provider last month, and was found to have a hemoglobin A1c greater than 15.5.  He has been dealing with generalized weakness and shortness of breath, and was referred to cardiology for further evaluation. He reports chronic problems with abdominal discomfort and excessive gas.  This has been more bothersome in the past 2 to 3 months.  His bowel movements are fairly regular, with formed stools, sometimes hard and difficult to pass.  No problems with diarrhea.  He denies any changes in his diet accompanying the change in flatulence and abdominal discomfort.  He reports 45 pounds of unintentional weight loss over the last year.  Recently, he has gained back about 5 or 6 pounds since being back on diabetes medications. The patient is also very bothered by hemorrhoids.  He reports recurring episodes of perianal swelling and discomfort, described as a dull pressure sensation with palpable swelling/lumps in the area.  He will sometimes see blood on the toilet paper.  He denies severe pain with the passage of stool.  Denies symptoms of prolapsing hemorrhoids.  He takes an over-the-counter fiber powder, but if he takes too much, this will worsen his abdominal pain and bloating. He reports having 2 colonoscopies in the past, to evaluate his chronic GI symptoms, both of which were normal per the patient.  He is not sure when his last one was, but it was several years ago.  He has no family history of GI  malignancy.   Past Medical History:  Diagnosis Date   Arthritis    Diabetes mellitus without complication ([place])    Heart disease    Hypertension      Past Surgical History:  Procedure Laterality Date   CORONARY ARTERY BYPASS GRAFT     Family History  Problem Relation Age of Onset   Diabetes Mother    Hypertension Mother    Diabetes Father    Hypertension Father    Heart attack Father    Diabetes Sister    Diabetes Sister    Social History   Tobacco Use   Smoking status: Never   Smokeless tobacco: Never  Vaping Use   Vaping Use: Never used  Substance Use Topics   Alcohol use: Not Currently    Comment: none in 6 months may open a bottle of wine tonight 11/1112022   Drug use: Never   Current Outpatient Medications  Medication Sig Dispense Refill   aspirin EC 81 MG tablet Take 81 mg by mouth daily. Swallow whole.     blood glucose meter kit and supplies Dispense based on patient and insurance preference. Use up to four times daily as directed. (FOR ICD-10 E10.9, E11.9). 1 each 11   Insulin Glargine (BASAGLAR KWIKPEN) 100 UNIT/ML Inject 10 Units into the skin daily. Titration dose. Increase as directed 15 mL 1   Lidocaine-Glycerin (PREPARATION H EX) Apply 1 application topically as needed.     metFORMIN (GLUCOPHAGE-XR) 500 MG 24 hr tablet  Take 2 tablets (1,000 mg total) by mouth daily with breakfast. 60 tablet 2   No current facility-administered medications for this visit.   No Known Allergies   Review of Systems: All systems reviewed and negative except where noted in HPI.    No results found.  Physical Exam: BP 116/70 (BP Location: Left Arm, Patient Position: Sitting, Cuff Size: Normal)   Pulse 88   Ht _0  (1.676 m)   Wt 131 lb 8 oz (59.6 kg)   BMI 21.22 kg/m  Constitutional: Pleasant,well-developed, [place] male in no acute distress.  Patient appears frail and older than stated age.  Uses cane for ambulation HEENT: Normocephalic and atraumatic.  Conjunctivae are normal. No scleral icterus. Cardiovascular: Normal rate, regular rhythm.  Sternotomy scar Pulmonary/chest: Effort normal and breath sounds normal. No wheezing, rales or rhonchi. Abdominal: Soft, nondistended, nontender. Bowel sounds active throughout. There are no masses palpable. No hepatomegaly. Extremities: no edema. Neurological: Alert and oriented to person place and time.  Patient has mild difficulty climbing onto exam table Skin: Skin is warm and dry. No rashes noted. Psychiatric: Normal mood and affect. Behavior is normal.  CBC    Component Value Date/Time   WBC 6.7 12/01/2020 1259   RBC 4.39 12/01/2020 1259   HGB 13.6 12/01/2020 1410   HCT 40.0 12/01/2020 1410   PLT 205 12/01/2020 1259   MCV 89.7 12/01/2020 1259   MCH 31.9 12/01/2020 1259   MCHC 35.5 12/01/2020 1259   RDW 11.8 12/01/2020 1259    CMP     Component Value Date/Time   NA 135 12/23/2020 1140   K 4.6 12/23/2020 1140   CL 98 12/23/2020 1140   CO2 14 (L) 12/23/2020 1140   GLUCOSE 360 (H) 12/23/2020 1140   GLUCOSE 682 (HH) 12/01/2020 1259   BUN 14 12/23/2020 1140   CREATININE 0.90 12/23/2020 1140   CALCIUM 9.5 12/23/2020 1140   GFRNONAA >60 12/01/2020 1259     ASSESSMENT AND PLAN: 60 year old male with longstanding diabetes, currently uncontrolled due to lack of insurance and ability to afford medications, with bothersome symptoms of excessive bloating and flatulence, as well as external hemorrhoid symptoms.  He has also had unintentional weight loss and generalized weakness in the past year.  His GI symptoms are not particularly concerning, but his unintentional weight loss is.  However I suspect this may be related to his severe chronic hyperglycemia.  Some of his GI symptoms may be related to dysmotility secondary to hyperglycemia versus gastroparesis as well.  Now that he is back on appropriate medications, would like to see if both his weight increases as well as some of his GI symptoms.   Given his history of coronary artery disease and his generalized deconditioning, I am not inclined to proceed with an elective sedated procedure at this time.  I would like for him to follow-up with his cardiologist, and get his diabetes better controlled before proceeding with an endoscopic evaluation for his weight loss. For his bloating and gas, I recommended he try Align probiotic and Gas-X and continue to focus on achieving euglycemia.  He should continue daily fiber supplementation as well For his hemorrhoids, he admitted to spending prolonged periods on the toilet during bowel movements, and I suspect that improved toilet habits will reduce his hemorrhoidal symptoms.  Continue with the fiber supplementation, avoiding hard stools and straining.  Continue use over-the-counter hemorrhoid creams as needed for flareups.  Weight loss, suspect related to uncontrolled diabetes - Reconsider EGD and colonoscopy  after cardiac evaluation  Bloating/flatulence -Align probiotic -Gas-X -Continue fiber -Improved glycemic control - Consider breath testing +/- gastric emptying study once hyperglycemia improved  Hemorrhoids - Improved toilet habits -Continue fiber - Continue OTC creams as needed (patient advised to use no more than 2 weeks at a time)  Bailey Kolbe E. Candis Schatz, MD [facility]   CC:  de [place], Blondell Reveal, MD

## 2021-01-26 ENCOUNTER — Ambulatory Visit (HOSPITAL_BASED_OUTPATIENT_CLINIC_OR_DEPARTMENT_OTHER): Payer: Managed Care, Other (non HMO) | Admitting: Family Medicine

## 2021-01-27 ENCOUNTER — Encounter (HOSPITAL_BASED_OUTPATIENT_CLINIC_OR_DEPARTMENT_OTHER): Payer: Self-pay | Admitting: Family Medicine

## 2021-01-27 ENCOUNTER — Other Ambulatory Visit: Payer: Self-pay

## 2021-01-27 ENCOUNTER — Ambulatory Visit (INDEPENDENT_AMBULATORY_CARE_PROVIDER_SITE_OTHER): Payer: Managed Care, Other (non HMO) | Admitting: Family Medicine

## 2021-01-27 VITALS — BP 122/82 | HR 99 | Ht 66.0 in | Wt 129.6 lb

## 2021-01-27 DIAGNOSIS — E1165 Type 2 diabetes mellitus with hyperglycemia: Secondary | ICD-10-CM

## 2021-01-27 DIAGNOSIS — Z794 Long term (current) use of insulin: Secondary | ICD-10-CM

## 2021-01-27 DIAGNOSIS — N471 Phimosis: Secondary | ICD-10-CM

## 2021-01-27 DIAGNOSIS — I2510 Atherosclerotic heart disease of native coronary artery without angina pectoris: Secondary | ICD-10-CM | POA: Diagnosis not present

## 2021-01-27 MED ORDER — BASAGLAR KWIKPEN 100 UNIT/ML ~~LOC~~ SOPN: 50.0000 [IU] | PEN_INJECTOR | Freq: Every day | SUBCUTANEOUS | 1 refills | Status: DC

## 2021-01-27 MED ORDER — METFORMIN HCL ER (MOD) 1000 MG PO TB24: 1000.0000 mg | ORAL_TABLET | Freq: Two times a day (BID) | ORAL | 1 refills | Status: DC

## 2021-01-27 NOTE — Assessment & Plan Note (Addendum)
Has been titrating insulin, is now up to 45 units nightly.  He is also continue with metformin, currently taking 1 tab twice daily Reports that he will check his fasting blood sugar in the morning and he continues to run "high" and occasionally will have readings that are 300 or greater, otherwise machine is not able to read the blood sugar due to being so high He is fasting this morning, we will check fasting blood sugar in the office this morning to assess accuracy Has been referred to endocrinology previously, however difficulty arranging with current referral.  Will refer to new endocrinology office for patient to establish Blood sugar in office today is 289.  Advised patient that he can return to the office with his home tocometer to compare with ours for accuracy, this can be done as a nurse visit Will continue with titration of insulin, progressed to insulin glargine 50 units daily.  Continue to monitor fasting blood sugar, titrate as previously discussed

## 2021-01-27 NOTE — Assessment & Plan Note (Signed)
Patient previously had presented to the emergency department related to balanitis and was treated with antifungal.  He reports that this has cleared up, however he continues to have issues now with tightness of the foreskin over the glans of the penis with inability to retract.  This has led to difficulty with urinating as well as having some associated discomfort/pain with the skin tightness Will refer to urology for further evaluation and management

## 2021-01-27 NOTE — Progress Notes (Signed)
    Procedures performed today:    None.  Independent interpretation of notes and tests performed by another provider:   None.  Brief History, Exam, Impression, and Recommendations:    BP 122/82   Pulse 99   Ht 5\' 6"  (1.676 m)   Wt 129 lb 9.6 oz (58.8 kg)   SpO2 (!) 86%   BMI 20.92 kg/m   Phimosis Patient previously had presented to the emergency department related to balanitis and was treated with antifungal.  He reports that this has cleared up, however he continues to have issues now with tightness of the foreskin over the glans of the penis with inability to retract.  This has led to difficulty with urinating as well as having some associated discomfort/pain with the skin tightness Will refer to urology for further evaluation and management  Diabetes (HCC) Has been titrating insulin, is now up to 45 units nightly.  He is also continue with metformin, currently taking 1 tab twice daily Reports that he will check his fasting blood sugar in the morning and he continues to run "high" and occasionally will have readings that are 300 or greater, otherwise machine is not able to read the blood sugar due to being so high He is fasting this morning, we will check fasting blood sugar in the office this morning to assess accuracy Has been referred to endocrinology previously, however difficulty arranging with current referral.  Will refer to new endocrinology office for patient to establish Blood sugar in office today is 289.  Advised patient that he can return to the office with his home tocometer to compare with ours for accuracy, this can be done as a nurse visit Will continue with titration of insulin, progressed to insulin glargine 50 units daily.  Continue to monitor fasting blood sugar, titrate as previously discussed  CAD (coronary artery disease) Previously referred to cardiology, has not arranged yet Provided information for cardiology office for patient to contact them to  arrange for establishing visit  Plan for follow-up in about 3 to 4 weeks or sooner as needed, continue to monitor blood sugars, follow-up on referrals   ___________________________________________ Jayleigh Notarianni de , MD, ABFM, CAQSM Primary Care and Sports Medicine [facility]

## 2021-01-27 NOTE — Assessment & Plan Note (Signed)
Previously referred to cardiology, has not arranged yet Provided information for cardiology office for patient to contact them to arrange for establishing visit

## 2021-01-27 NOTE — Patient Instructions (Addendum)
  Medication Instructions:  Your physician has recommended you make the following change in your medication:  --Increase Metformin to 1000 mg twice daily as advised by Dr. de Peru  --If you need a refill on any your medications before your next appointment, please call your pharmacy first. If no refills are authorized on file call the office.--  Referrals/Procedures/Imaging: A referral has been placed for you to [facility] for evaluation and treatment. Someone from the scheduling department will be in contact with you in regards to coordinating your consultation. If you do not hear from any of the schedulers within 7-10 business days please give their office a call.  A referral has been placed for you to [facility] (Endocrinology) for evaluation and treatment. Someone from the scheduling department will be in contact with you in regards to coordinating your consultation. If you do not hear from any of the schedulers within 7-10 business days please give their office a call.  Follow-Up: Your next appointment:   Your physician recommends that you schedule a follow-up appointment in: 3-4 WEEKS with Dr. de Peru  You will receive a text message or e-mail with a link to a survey about your care and experience with [place] today! We would greatly appreciate your feedback!   Thanks for letting us be apart of your health journey!!  Primary Care and Sports Medicine   Dr. Ceasar Mons Peru   We encourage you to activate your patient portal called "MyChart".  Sign up information is provided on this After Visit Summary.  MyChart is used to connect with patients for Virtual Visits (Telemedicine).  Patients are able to view lab/test results, encounter notes, upcoming appointments, etc.  Non-urgent messages can be sent to your provider as well. To learn more about what you can do with MyChart, please visit --  [email or web address].

## 2021-02-08 ENCOUNTER — Encounter: Payer: Self-pay | Admitting: Urology

## 2021-02-08 ENCOUNTER — Other Ambulatory Visit: Payer: Self-pay

## 2021-02-08 ENCOUNTER — Ambulatory Visit: Payer: Managed Care, Other (non HMO) | Admitting: Urology

## 2021-02-08 VITALS — BP 128/79 | HR 94 | Wt 129.0 lb

## 2021-02-08 DIAGNOSIS — N401 Enlarged prostate with lower urinary tract symptoms: Secondary | ICD-10-CM

## 2021-02-08 DIAGNOSIS — N471 Phimosis: Secondary | ICD-10-CM

## 2021-02-08 DIAGNOSIS — N138 Other obstructive and reflux uropathy: Secondary | ICD-10-CM

## 2021-02-08 DIAGNOSIS — N400 Enlarged prostate without lower urinary tract symptoms: Secondary | ICD-10-CM | POA: Insufficient documentation

## 2021-02-08 LAB — URINALYSIS, ROUTINE W REFLEX MICROSCOPIC
Bilirubin, UA: NEGATIVE
Ketones, UA: NEGATIVE
Leukocytes,UA: NEGATIVE
Nitrite, UA: NEGATIVE
Protein,UA: NEGATIVE
RBC, UA: NEGATIVE
Specific Gravity, UA: 1.015 (ref 1.005–1.030)
Urobilinogen, Ur: 0.2 mg/dL (ref 0.2–1.0)
pH, UA: 6.5 (ref 5.0–7.5)

## 2021-02-08 LAB — BLADDER SCAN AMB NON-IMAGING: Scan Result: 10

## 2021-02-08 MED ORDER — TAMSULOSIN HCL 0.4 MG PO CAPS: 0.4000 mg | ORAL_CAPSULE | Freq: Every day | ORAL | 11 refills | Status: DC

## 2021-02-08 MED ORDER — CLOTRIMAZOLE-BETAMETHASONE 1-0.05 % EX CREA: 1.0000 "application " | TOPICAL_CREAM | Freq: Two times a day (BID) | CUTANEOUS | 2 refills | Status: DC

## 2021-02-08 MED ORDER — METFORMIN HCL 1000 MG PO TABS: 1000.0000 mg | ORAL_TABLET | Freq: Two times a day (BID) | ORAL | 2 refills | Status: DC

## 2021-02-08 NOTE — Progress Notes (Signed)
post void residual 10  Urological Symptom Review  Patient is experiencing the following symptoms: Frequent urination Hard to postpone urination Burning/pain with urination Get up at night to urinate Stream starts and stops Trouble starting stream Painful intercourse Erection problems (male only) Penile pain (male only)    Review of Systems  Gastrointestinal (upper)  : Nausea Indigestion/heartburn  Gastrointestinal (lower) : Constipation  Constitutional : Negative for symptoms  Skin: Itching  Eyes: Negative for eye symptoms  Ear/Nose/Throat : Negative for Ear/Nose/Throat symptoms  Hematologic/Lymphatic: Negative for Hematologic/Lymphatic symptoms  Cardiovascular : Negative for cardiovascular symptoms  Respiratory : Negative for respiratory symptoms  Endocrine: Excessive thirst  Musculoskeletal: Negative for musculoskeletal symptoms  Neurological: Negative for neurological symptoms  Psychologic: Negative for psychiatric symptoms

## 2021-02-08 NOTE — Progress Notes (Signed)
Assessment: 1. Phimosis   2. BPH with obstruction/lower urinary tract symptoms     Plan: PSA today  I discussed management options for phimosis including circumcision and topical therapy.  Circumcision procedure including potential risk reviewed.  He would like to try topical therapy at this time. Lotrisone cream to foreskin twice daily. Options for management of his BPH with lower urinary tract symptoms discussed including observation, alpha-blocker therapy, 5 alpha reductase inhibitor therapy, minimally invasive procedures, and surgical management reviewed.  He would like to try medical therapy. Tamsulosin 0.4 mg daily.  Prescription sent. Return to office in 6 weeks.  Chief Complaint:  Chief Complaint  Patient presents with   phimosis    History of Present Illness:  Charles [name] is a 60 y.o. year old male who is seen in consultation from de [place], Buren Kos, MD for evaluation of phimosis.  He was treated for an episode of balanitis in September 2022.  Since that time, he has had difficulty retracting the foreskin.  He has pain with attempted retraction.  He is not having any discharge.  No prior history of balanitis.  He does report a 1 month history of worsening lower urinary tract symptoms.  He has frequency, urgency, nocturia 2-3 times, some dysuria, hesitancy, and intermittent stream.  No gross hematuria or recent UTIs. IPSS = 17 today.   Past Medical History:  Past Medical History:  Diagnosis Date   Arthritis    Diabetes mellitus without complication (HCC)    Heart disease    Hypertension     Past Surgical History:  Past Surgical History:  Procedure Laterality Date   CORONARY ARTERY BYPASS GRAFT      Allergies:  No Known Allergies  Family History:  Family History  Problem Relation Age of Onset   Diabetes Mother    Hypertension Mother    Diabetes Father    Hypertension Father    Heart attack Father    Diabetes Sister    Diabetes Sister     Social  History:  Social History   Tobacco Use   Smoking status: Never   Smokeless tobacco: Never  Vaping Use   Vaping Use: Never used  Substance Use Topics   Alcohol use: Not Currently    Comment: none in 6 months may open a bottle of wine tonight 11/1112022   Drug use: Never    Review of symptoms:  Constitutional:  Negative for unexplained weight loss, night sweats, fever, chills ENT:  Negative for nose bleeds, sinus pain, painful swallowing CV:  Negative for chest pain, shortness of breath, exercise intolerance, palpitations, loss of consciousness Resp:  Negative for cough, wheezing, shortness of breath GI:  Negative for nausea, vomiting, diarrhea, bloody stools GU:  Positives noted in HPI; otherwise negative for gross hematuria, urinary incontinence Neuro:  Negative for seizures, poor balance, limb weakness, slurred speech Psych:  Negative for lack of energy, depression, anxiety Endocrine:  Negative for polydipsia, polyuria, symptoms of hypoglycemia (dizziness, hunger, sweating) Hematologic:  Negative for anemia, purpura, petechia, prolonged or excessive bleeding, use of anticoagulants  Allergic:  Negative for difficulty breathing or choking as a result of exposure to anything; no shellfish allergy; no allergic response (rash/itch) to materials, foods  Physical exam: BP 128/79   Pulse 94   Wt 129 lb (58.5 kg)   BMI 20.82 kg/m  GENERAL APPEARANCE:  Well appearing, well developed, well nourished, NAD HEENT: Atraumatic, Normocephalic, oropharynx clear. NECK: Supple without lymphadenopathy or thyromegaly. LUNGS: Clear to auscultation bilaterally.  HEART: Regular Rate and Rhythm without murmurs, gallops, or rubs. ABDOMEN: Soft, non-tender, No Masses. EXTREMITIES: Moves all extremities well.  Without clubbing, cyanosis, or edema. NEUROLOGIC:  Alert and oriented x 3, normal gait, CN II-XII grossly intact.  MENTAL STATUS:  Appropriate. BACK:  Non-tender to palpation.  No CVAT SKIN:   Warm, dry and intact.   GU: Penis: Uncircumcised with phimosis; no erythema or discharge Meatus: Unable to visualize Scrotum: normal, no masses Testis: normal without masses bilateral Epididymis: normal Prostate: 40 g, nontender, no nodules Rectum: Normal tone,  no masses or tenderness   Results: U/A:  3+ glucose  PVR = 10 ml

## 2021-02-08 NOTE — Addendum Note (Signed)
Addended by: Dareen Piano on: 02/08/2021 04:15 PM   Modules accepted: Orders

## 2021-02-09 LAB — PSA: Prostate Specific Ag, Serum: 3.7 ng/mL (ref 0.0–4.0)

## 2021-02-14 ENCOUNTER — Telehealth: Payer: Self-pay

## 2021-02-14 NOTE — Telephone Encounter (Signed)
Called and lmv for patient call us back regarding his lab results. Pt has not review his results on his My Chart Acct.

## 2021-02-24 ENCOUNTER — Ambulatory Visit (HOSPITAL_BASED_OUTPATIENT_CLINIC_OR_DEPARTMENT_OTHER): Payer: Managed Care, Other (non HMO) | Admitting: Family Medicine

## 2021-03-09 ENCOUNTER — Encounter (HOSPITAL_BASED_OUTPATIENT_CLINIC_OR_DEPARTMENT_OTHER): Payer: Self-pay

## 2021-03-22 ENCOUNTER — Other Ambulatory Visit: Payer: Self-pay

## 2021-03-22 ENCOUNTER — Ambulatory Visit (INDEPENDENT_AMBULATORY_CARE_PROVIDER_SITE_OTHER): Payer: Managed Care, Other (non HMO) | Admitting: Cardiology

## 2021-03-22 ENCOUNTER — Encounter (HOSPITAL_BASED_OUTPATIENT_CLINIC_OR_DEPARTMENT_OTHER): Payer: Self-pay | Admitting: Cardiology

## 2021-03-22 VITALS — BP 140/80 | HR 94 | Resp 20 | Ht 66.0 in | Wt 144.0 lb

## 2021-03-22 DIAGNOSIS — E118 Type 2 diabetes mellitus with unspecified complications: Secondary | ICD-10-CM | POA: Diagnosis not present

## 2021-03-22 DIAGNOSIS — R531 Weakness: Secondary | ICD-10-CM | POA: Diagnosis not present

## 2021-03-22 DIAGNOSIS — R0602 Shortness of breath: Secondary | ICD-10-CM | POA: Diagnosis not present

## 2021-03-22 DIAGNOSIS — Z951 Presence of aortocoronary bypass graft: Secondary | ICD-10-CM

## 2021-03-22 DIAGNOSIS — I2510 Atherosclerotic heart disease of native coronary artery without angina pectoris: Secondary | ICD-10-CM

## 2021-03-22 DIAGNOSIS — Z7189 Other specified counseling: Secondary | ICD-10-CM

## 2021-03-22 DIAGNOSIS — Z794 Long term (current) use of insulin: Secondary | ICD-10-CM

## 2021-03-22 DIAGNOSIS — R5383 Other fatigue: Secondary | ICD-10-CM | POA: Diagnosis not present

## 2021-03-22 NOTE — Progress Notes (Signed)
Cardiology Office Note:    Date:  03/23/2021   ID:  Charles [name], DOB 1960/05/26, MRN [id number]  PCP:  de [place], Raymond J, MD  Cardiologist:  Buford Dresser, MD  Referring MD: de [place], Blondell Reveal, MD   CC: new patient consultation for fatigue/weakness in the setting of CAD  History of Present Illness:    Charles [name] is a 61 y.o. male with a hx of hypertension, diabetes, and arthritis, who is seen as a new consult at the request of de [place], Blondell Reveal, MD for the evaluation and management of hypertension and coronary artery disease.  Cardiovascular risk factors: Prior clinical ASCVD:  Comorbid conditions: Diabetes- First diagnosed as type 2 one year following CABG x4 in 2004, In 2022 he was told he had type 1 diabetes. Previously, he was off of Insulin and lost 20 lbs. He has gained some weight since restarting Insulin. Hypertension - Diagnosed 3 years  ago. Not controlled by medication currently, previously was on lisinopril. Metabolic syndrome/Obesity: Chronic inflammatory conditions: Tobacco use history: None.  Family history: Severe, Mother and Father died of heart issues. "Everyone" in his family has hyperlipidemia. Prior cardiac testing and/or incidental findings on other testing (ie coronary calcium): Stress Tests since CABG 2004 Exercise level: He states he is generally not able to walk due to imbalance, and he becomes short of breath with minimal exertion. This is concerning for him because he is no longer able to exercise, work, or walk without issues. He walks with a cane for assistance. He notes having extreme weakness, and getting out of bed in the morning or getting into his car can be difficult and painful. Also, he has fallen quite a few times. He no longer travels for work after he had a severe fall in a hotel room. 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  Current diet: Has also quit drinking alcohol due to his  imbalance.  In general, he feels his health is starting to fail, worsening over the past year.   Prior to his CABG 2004, he had a cough and shortness of breath, and had difficulty walking on a treadmill but did exercise regularly. He denies any chest pain then or currently.  Rosuvastatin 40 mg was recently added to his regimen, about a month.  Sometimes he experiences faster heart beats, which he associates with anxiety and stress.  Additionally, he endorses bilateral LE edema typically worse in the mornings.  Of note, he is not sleeping well. Lately he may only have 2-3 hours of sleep a day. This is mostly due to anxiety regarding his health.  He denies any lightheadedness, headaches, syncope, orthopnea, or PND.  Past Medical History:  Diagnosis Date   Arthritis    Diabetes mellitus without complication ([place])    Heart disease    Hypertension     Past Surgical History:  Procedure Laterality Date   CORONARY ARTERY BYPASS GRAFT      Current Medications: Current Outpatient Medications on File Prior to Visit  Medication Sig   aspirin EC 81 MG tablet Take 81 mg by mouth daily. Swallow whole.   blood glucose meter kit and supplies Dispense based on patient and insurance preference. Use up to four times daily as directed. (FOR ICD-10 E10.9, E11.9).   Insulin Glargine (BASAGLAR KWIKPEN) 100 UNIT/ML Inject 50 Units into the skin daily. Titration dose. Increase as directed   metFORMIN (GLUCOPHAGE) 1000 MG tablet Take 1 tablet (1,000  mg total) by mouth 2 (two) times daily with a meal.   rosuvastatin (CRESTOR) 40 MG tablet Take 40 mg by mouth daily.   tamsulosin (FLOMAX) 0.4 MG CAPS capsule Take 1 capsule (0.4 mg total) by mouth daily.   No current facility-administered medications on file prior to visit.     Allergies:   Patient has no known allergies.   Social History   Tobacco Use   Smoking status: Never   Smokeless tobacco: Never  Vaping Use   Vaping Use: Never used   Substance Use Topics   Alcohol use: Not Currently    Comment: none in 6 months may open a bottle of wine tonight 11/1112022   Drug use: Never    Family History: family history includes Diabetes in his father, mother, sister, and sister; Heart attack in his father; Hypertension in his father and mother.  ROS:   Please see the history of present illness.  Additional pertinent ROS: Constitutional: Negative for chills, fever, night sweats, unintentional weight loss. Positive for fatigue.  HENT: Negative for ear pain and hearing loss.   Eyes: Negative for loss of vision and eye pain.  Respiratory: Negative for cough, sputum, wheezing.   Cardiovascular: See HPI. Gastrointestinal: Negative for abdominal pain, melena, and hematochezia.  Genitourinary: Negative for dysuria and hematuria.  Musculoskeletal: Positive for falls, generalized weakness, and myalgias.  Skin: Negative for itching and rash.  Neurological: Negative for focal sensory changes and loss of consciousness. Positive for imbalance, stress, anxiety, and insomnia. Endo/Heme/Allergies: Does not bruise/bleed easily.     EKGs/Labs/Other Studies Reviewed:    The following studies were reviewed today:  No prior cardiovascular studies available.   EKG:  EKG is personally reviewed.   03/22/2021: NSR at 94 bpm, abnormal T wave in inferolateral leads, no prior  Recent Labs: 12/01/2020: Hemoglobin 13.6; Platelets 205 12/23/2020: BUN 14; Creatinine, Ser 0.90; Potassium 4.6; Sodium 135   Recent Lipid Panel    Component Value Date/Time   CHOL 328 (H) 12/23/2020 1140   TRIG 251 (H) 12/23/2020 1140   HDL 46 12/23/2020 1140   CHOLHDL 7.1 (H) 12/23/2020 1140   LDLCALC 231 (H) 12/23/2020 1140    Physical Exam:    VS:  BP 140/80 (BP Location: Left Arm, Patient Position: Sitting, Cuff Size: Normal)    Pulse 94    Resp 20    Ht $R'5\' 6"'am$  (1.676 m)    Wt 144 lb (65.3 kg)    SpO2 95%    BMI 23.24 kg/m     Wt Readings from Last 3  Encounters:  03/22/21 144 lb (65.3 kg)  02/08/21 129 lb (58.5 kg)  01/27/21 129 lb 9.6 oz (58.8 kg)    GEN: Well nourished, well developed in no acute distress HEENT: Normal, moist mucous membranes NECK: No JVD CARDIAC: regular rhythm, normal S1 and S2, no rubs or gallops. No murmur. VASCULAR: Radial and DP pulses 2+ bilaterally. No carotid bruits RESPIRATORY:  Clear to auscultation without rales, wheezing or rhonchi  ABDOMEN: Soft, non-tender, non-distended MUSCULOSKELETAL:  Ambulates with a cane. Very short steps, cannot climb one stair up/down to exam table without assistance SKIN: Warm and dry, no edema NEUROLOGIC:  Alert and oriented x 3. No focal neuro deficits noted. PSYCHIATRIC:  Normal affect    ASSESSMENT:    1. Generalized weakness   2. Fatigue, unspecified type   3. SOB (shortness of breath)   4. Type 2 diabetes mellitus with complication, with long-term current use of insulin ([place])  5. Coronary artery disease involving native coronary artery of native heart without angina pectoris   6. Hx of CABG   7. Cardiac risk counseling   8. Counseling on health promotion and disease prevention    PLAN:    Fatigue, shortness of breath, generalized weakness, dyspnea on exertion -This is very concerning. 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 He walks with a can and sometimes doesn't have the strength to get up from his bed unassisted, sometimes having to soil himself in the bed because he is too  weak to get up -I am very concerned about this. I worry about neurologic or autoimmune/inflammatory diseases. I think he needs a thorough workup. He lives near [facility]. With his limited mobility, he would prefer to follow up there if possible for general medical care -he does not have any edema, JVD, or rales to suggest this is related to heart failure, but will check echo to exclude it -see next steps for CAD,  below  History of CAD History of 4V CABG 2004 at [facility] in [place]. Attempted to get records, not available in [place] -did not have cardiologist in [place] but was able to link to medicine notes -has stress tests post CABG but has not had cath -we will start with echo as above. If echo largely normal, will pursue lexiscan nuclear stress test. If echo abnormal, will pursue cath -he denies angina -counseled on red flag warning signs that need immediate medical attention -continue aspirin -he was recently restarted on rosuvastatin 40 mg daily several days ago. 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 Now following with Dr. Chalmers Cater.  -A1c 12/2020 >15.5  Cardiac risk counseling and prevention recommendations: -recommend heart healthy/Mediterranean diet, with whole grains, fruits, vegetable, fish, lean meats, nuts, and olive oil. Limit salt. -recommend moderate walking, 3-5 times/week for 30-50 minutes each session. Aim for at least 150 minutes.week. Goal should be pace of 3 miles/hours, or walking 1.5 miles in 30 minutes -recommend avoidance of tobacco products. Avoid excess alcohol.    Plan for follow up: 6 weeks or sooner as needed.  I am very concerned about him and the amount of decline he has had. He has a high risk medical history, and his current symptoms are complex without clear etiology. He needs a thorough evaluation--we will start the cardiac evaluation, but he may also benefit from autoimmune/inflammatory evaluation, neurology, etc.  Buford Dresser, MD, PhD, [facility]    Medication Adjustments/Labs and Tests Ordered: Current medicines are reviewed at length with the patient today.  Concerns regarding medicines are outlined above.   Orders Placed This  Encounter  Procedures   Lipid panel   EKG 12-Lead   ECHOCARDIOGRAM COMPLETE   No orders of the defined types were placed in this encounter.  Patient Instructions  Medication Instructions:  Your physician recommends that you continue on your current medications as directed. Please refer to the Current Medication list given to you today.  *If you need a refill on your cardiac medications before your next appointment, please call your pharmacy*   Lab Work: Fasting lipids in the future as discussed If you have labs (blood work) drawn today and your  tests are completely normal, you will receive your results only by: MyChart Message (if you have MyChart) OR A paper copy in the mail If you have any lab test that is abnormal or we need to change your treatment, we will call you to review the results.   Testing/Procedures: Your physician has requested that you have an echocardiogram. Echocardiography is a painless test that uses sound waves to create images of your heart. It provides your doctor with information about the size and shape of your heart and how well your hearts chambers and valves are working. This procedure takes approximately one hour. There are no restrictions for this procedure.    Follow-Up: At [facility], you and your health needs are our priority.  As part of our continuing mission to provide you with exceptional heart care, we have created designated Provider Care Teams.  These Care Teams include your primary Cardiologist (physician) and Advanced Practice Providers (APPs -  Physician Assistants and Nurse Practitioners) who all work together to provide you with the care you need, when you need it.   Your next appointment:   6 week(s)  The format for your next appointment:   In Person  Provider:   Buford Dresser, MD{   I,Mathew Stumpf,acting as a scribe for Buford Dresser, MD.,have documented all relevant documentation on the behalf of Buford Dresser, MD,as directed by  Buford Dresser, MD while in the presence of Buford Dresser, MD.  I, Buford Dresser, MD, have reviewed all documentation for this visit. The documentation on 03/23/21 for the exam, diagnosis, procedures, and orders are all accurate and complete.   Signed, Buford Dresser, MD PhD 03/23/2021 9:15 AM    [place]

## 2021-03-22 NOTE — Patient Instructions (Signed)
Medication Instructions:  Your physician recommends that you continue on your current medications as directed. Please refer to the Current Medication list given to you today.  *If you need a refill on your cardiac medications before your next appointment, please call your pharmacy*   Lab Work: Fasting lipids in the future as discussed If you have labs (blood work) drawn today and your tests are completely normal, you will receive your results only by: MyChart Message (if you have MyChart) OR A paper copy in the mail If you have any lab test that is abnormal or we need to change your treatment, we will call you to review the results.   Testing/Procedures: Your physician has requested that you have an echocardiogram. Echocardiography is a painless test that uses sound waves to create images of your heart. It provides your doctor with information about the size and shape of your heart and how well your hearts chambers and valves are working. This procedure takes approximately one hour. There are no restrictions for this procedure.    Follow-Up: At [facility], you and your health needs are our priority.  As part of our continuing mission to provide you with exceptional heart care, we have created designated Provider Care Teams.  These Care Teams include your primary Cardiologist (physician) and Advanced Practice Providers (APPs -  Physician Assistants and Nurse Practitioners) who all work together to provide you with the care you need, when you need it.   Your next appointment:   6 week(s)  The format for your next appointment:   In Person  Provider:   Jodelle Red, MD{

## 2021-03-23 ENCOUNTER — Ambulatory Visit: Payer: Managed Care, Other (non HMO) | Admitting: Urology

## 2021-03-23 ENCOUNTER — Encounter (HOSPITAL_BASED_OUTPATIENT_CLINIC_OR_DEPARTMENT_OTHER): Payer: Self-pay | Admitting: Cardiology

## 2021-03-23 DIAGNOSIS — E119 Type 2 diabetes mellitus without complications: Secondary | ICD-10-CM | POA: Insufficient documentation

## 2021-03-23 DIAGNOSIS — Z951 Presence of aortocoronary bypass graft: Secondary | ICD-10-CM | POA: Insufficient documentation

## 2021-03-23 DIAGNOSIS — E118 Type 2 diabetes mellitus with unspecified complications: Secondary | ICD-10-CM | POA: Insufficient documentation

## 2021-03-24 ENCOUNTER — Ambulatory Visit (INDEPENDENT_AMBULATORY_CARE_PROVIDER_SITE_OTHER): Payer: Managed Care, Other (non HMO)

## 2021-03-24 ENCOUNTER — Other Ambulatory Visit: Payer: Self-pay

## 2021-03-24 DIAGNOSIS — R5383 Other fatigue: Secondary | ICD-10-CM

## 2021-03-24 DIAGNOSIS — R0602 Shortness of breath: Secondary | ICD-10-CM

## 2021-03-24 LAB — ECHOCARDIOGRAM COMPLETE
AR max vel: 1.92 cm2
AV Area VTI: 1.86 cm2
AV Area mean vel: 1.78 cm2
AV Mean grad: 2 mmHg
AV Peak grad: 4.4 mmHg
Ao pk vel: 1.05 m/s
Area-P 1/2: 4.21 cm2
Calc EF: 50 %
S' Lateral: 1.56 cm
Single Plane A2C EF: 51.6 %
Single Plane A4C EF: 41.2 %

## 2021-04-03 NOTE — Progress Notes (Addendum)
[place] at [facility] [address], [place], [place] [address] [phone]  Date:  04/07/2021   Name:  Charles [name]   DOB:  1960-12-31   MRN:  [id number]  PCP:  Darreld Mclean, MD    Chief Complaint: New Patient (Initial Visit) (Concerns/ questions: 1. Pain (back). /Flu shot today: yes/Needs to schedule eye exam)   History of Present Illness:  Charles [name] is a 61 y.o. very pleasant male patient who presents with the following:  Seen today as a new patient on request of Dr. 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 Harrell Gave was concerned about a possible neuromuscular disease: Exercise level: He states he is generally not able to walk due to imbalance, and he becomes short of breath with minimal exertion. This is concerning for him because he is no longer able to exercise, work, or walk without issues. He walks with a cane for assistance. He notes having extreme weakness, and getting out of bed in the morning or getting into his car can be difficult and painful. Also, he has fallen quite a few times. He no longer travels for work after he had a severe fall in a hotel room. 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  Current diet: Has also quit drinking alcohol due to his imbalance.  His diabetes; type II versus LADA-is now being followed by endocrinology, Dr. Chalmers Cater   CABG in 2003  Pt notes he has been feeling really weak- and having difficulty walking He also has pain in his back and into his legs when he is walking His quads will get numb He tries to stretch but it does not really help  He is  ok while seated  Weakness gradually getting worse for 2 years Back pain for 6 weeks or so He had to stop his work as traveling was too hard for him  He has some arm weakness but legs are the main issue If he sat on the floor he could not get up without assistance He notes some constipation and difficulty passing urine - this is a change over the last few weeks as well  He is using a cane- can no longer really walk without it  His feet are not numb, but his quads are numb He can walk about 100 yards before he is really tired He will go to the grocery store; he needs to rest a few times while shopping However leaning on the cart is helpful   He notes his father had similar sx as well as severe DM; he died at age 16.    He also notes some difficulty sleeping and is using OTC meds to help with this -he wonders about a possible prescription for insomnia Patient Active Problem List   Diagnosis Date Noted   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/23/2021   Type 2 diabetes mellitus with complication, with long-term current use of insulin ([place]) 03/23/2021   BPH with obstruction/lower urinary tract symptoms 02/08/2021   Phimosis 01/27/2021   Diabetes ([place]) 12/23/2020   Hypertension 12/23/2020   Coronary artery disease involving  native coronary artery of native heart without angina pectoris 12/23/2020    Past Medical History:  Diagnosis Date   Arthritis    Diabetes mellitus without complication ([place])    Heart disease    Hypertension     Past Surgical History:  Procedure Laterality Date   CORONARY ARTERY BYPASS GRAFT      Social History   Tobacco Use   Smoking status: Never   Smokeless tobacco: Never  Vaping Use   Vaping Use: Never used  Substance Use Topics   Alcohol use: Not Currently    Comment: none in 6 months may open a bottle of wine tonight 11/1112022   Drug use: Never    Family History  Problem Relation Age of Onset   Diabetes Mother    Hypertension Mother    Diabetes Father     Hypertension Father    Heart attack Father    Diabetes Sister    Diabetes Sister     No Known Allergies  Medication list has been reviewed and updated.  Current Outpatient Medications on File Prior to Visit  Medication Sig Dispense Refill   aspirin EC 81 MG tablet Take 81 mg by mouth daily. Swallow whole.     blood glucose meter kit and supplies Dispense based on patient and insurance preference. Use up to four times daily as directed. (FOR ICD-10 E10.9, E11.9). 1 each 11   Insulin Glargine (BASAGLAR KWIKPEN) 100 UNIT/ML Inject 50 Units into the skin daily. Titration dose. Increase as directed 15 mL 1   metFORMIN (GLUCOPHAGE-XR) 500 MG 24 hr tablet Take 2 tablets by mouth 2 (two) times daily.     rosuvastatin (CRESTOR) 40 MG tablet Take 40 mg by mouth daily.     tamsulosin (FLOMAX) 0.4 MG CAPS capsule Take 1 capsule (0.4 mg total) by mouth daily. 30 capsule 11   No current facility-administered medications on file prior to visit.    Review of Systems:  As per HPI- otherwise negative.   Physical Examination: Vitals:   04/07/21 0849  BP: 118/72  Pulse: 96  Resp: 18  Temp: 97.8 F (36.6 C)  SpO2: 97%   Vitals:   04/07/21 0849  Weight: 146 lb 9.6 oz (66.5 kg)  Height: $Remove'5\' 6"'qkWMLyZ$  (1.676 m)   Body mass index is 23.66 kg/m. Ideal Body Weight: Weight in (lb) to have BMI = 25: 154.6  GEN: no acute distress.  Slight build, appears healthy but has an abnormal gait and somewhat hunched posture when standing HEENT: Atraumatic, Normocephalic.  Ears and Nose: No external deformity. CV: RRR, No M/G/R. No JVD. No thrill. No extra heart sounds. PULM: CTA B, no wheezes, crackles, rhonchi. No retractions. No resp. distress. No accessory muscle use. ABD: S, NT, ND EXTR: No c/c/e PSYCH: Normally interactive. Conversant.  Patient uses a cane to ambulate.  He has difficulty walking without his cane.  Upper extremity strength is 4+ out of 5, very weak biceps tendon reflexes Lower extremity  strength 3+ out of 5, I am not able to elicit patellar reflexes Lumbar flexion is reduced.  Lumbar extension is normal.  He has a difficult time raising onto his toes without using his cane, can only bring his heels about 1 cm off the ground He notes numbness in bilateral anterior thighs  Assessment and Plan: Weakness of both lower extremities - Plan: DG Lumbar Spine Complete, DG Thoracic Spine 2 View, Ambulatory referral to Neurology  Need for influenza vaccination - Plan: Flu Vaccine QUAD 6+  mos PF IM (Fluarix Quad PF)  Primary insomnia - Plan: traZODone (DESYREL) 50 MG tablet  Generalized weakness - Plan: Ambulatory referral to Neurology  Patient seen today with concern of generalized weakness, especially pronounced in his lower extremities.  This has been progressing over the last 2 years, has now forced him to leave his job.  So far no particular evaluation has been completed Discussed with patient-consider severe spinal stenosis, or perhaps a neuromuscular disorder. We will start by obtaining plain films of his thoracic and lumbar spine, in preparation for MRI.  Patient denies any indwelling pumps or devices which would preclude MRI I have also placed an urgent referral to neurology Flu shot updated Trazodone prescription to use as needed for insomnia  Signed Lamar Blinks, MD  Received patient plain films as below, message to patient  DG Thoracic Spine 2 View  Result Date: 04/07/2021 CLINICAL DATA:  Chronic back pain. EXAM: THORACIC SPINE 2 VIEWS COMPARISON:  None. FINDINGS: There is no evidence of thoracic spine fracture. Alignment is normal. Mild multilevel degenerative changes are noted in midthoracic spine. IMPRESSION: Mild multilevel degenerative disc disease. No acute abnormality seen. Electronically Signed   By: Marijo Conception M.D.   On: 04/07/2021 09:58   DG Lumbar Spine Complete  Result Date: 04/07/2021 CLINICAL DATA:  Chronic lower back pain. EXAM: LUMBAR SPINE -  COMPLETE 4+ VIEW COMPARISON:  None. FINDINGS: There is no evidence of lumbar spine fracture. Alignment is normal. Intervertebral disc spaces are maintained. Minimal anterior osteophyte formation is noted at L1-2, L2-3 and L3-4. IMPRESSION: Minimal multilevel degenerative changes are noted. No acute abnormality is noted. Aortic Atherosclerosis (ICD10-I70.0). Electronically Signed   By: Marijo Conception M.D.   On: 04/07/2021 10:00

## 2021-04-07 ENCOUNTER — Encounter: Payer: Self-pay | Admitting: Family Medicine

## 2021-04-07 ENCOUNTER — Other Ambulatory Visit: Payer: Self-pay

## 2021-04-07 ENCOUNTER — Ambulatory Visit (HOSPITAL_BASED_OUTPATIENT_CLINIC_OR_DEPARTMENT_OTHER)
Admission: RE | Admit: 2021-04-07 | Discharge: 2021-04-07 | Disposition: A | Discharge status: Home / Self Care | Payer: Commercial Managed Care - HMO | Source: Ambulatory Visit | Attending: Family Medicine | Admitting: Family Medicine

## 2021-04-07 ENCOUNTER — Ambulatory Visit (INDEPENDENT_AMBULATORY_CARE_PROVIDER_SITE_OTHER): Payer: Managed Care, Other (non HMO) | Admitting: Family Medicine

## 2021-04-07 VITALS — BP 118/72 | HR 96 | Temp 97.8°F | Resp 18 | Ht 66.0 in | Wt 146.6 lb

## 2021-04-07 DIAGNOSIS — Z23 Encounter for immunization: Secondary | ICD-10-CM | POA: Diagnosis not present

## 2021-04-07 DIAGNOSIS — R531 Weakness: Secondary | ICD-10-CM

## 2021-04-07 DIAGNOSIS — R29898 Other symptoms and signs involving the musculoskeletal system: Secondary | ICD-10-CM

## 2021-04-07 DIAGNOSIS — F5101 Primary insomnia: Secondary | ICD-10-CM | POA: Diagnosis not present

## 2021-04-07 IMAGING — DX DG LUMBAR SPINE COMPLETE 4+V
5 series · 5 of 5 positions shown · non-contrast
Comparison: None.

CLINICAL DATA: Chronic lower back pain.

EXAM:
LUMBAR SPINE - COMPLETE 4+ VIEW

[l-spine ap]
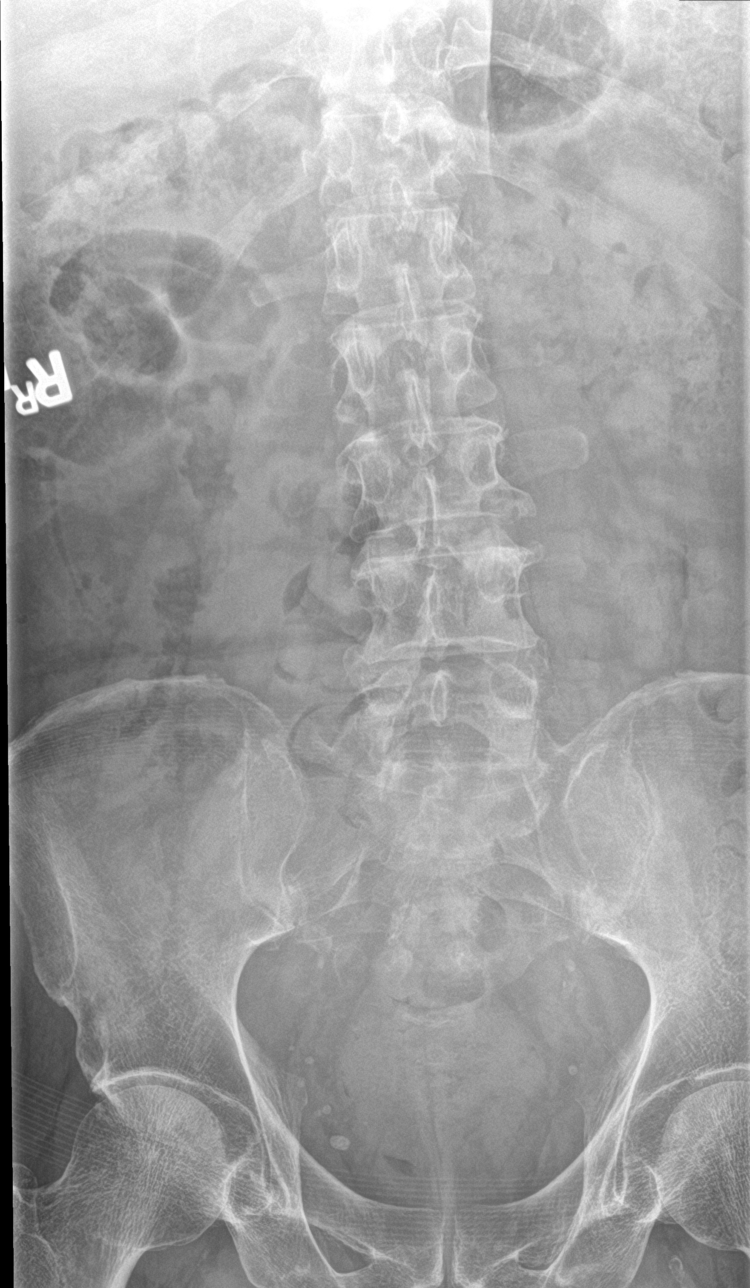

[l-spine obl (1 of 2)]
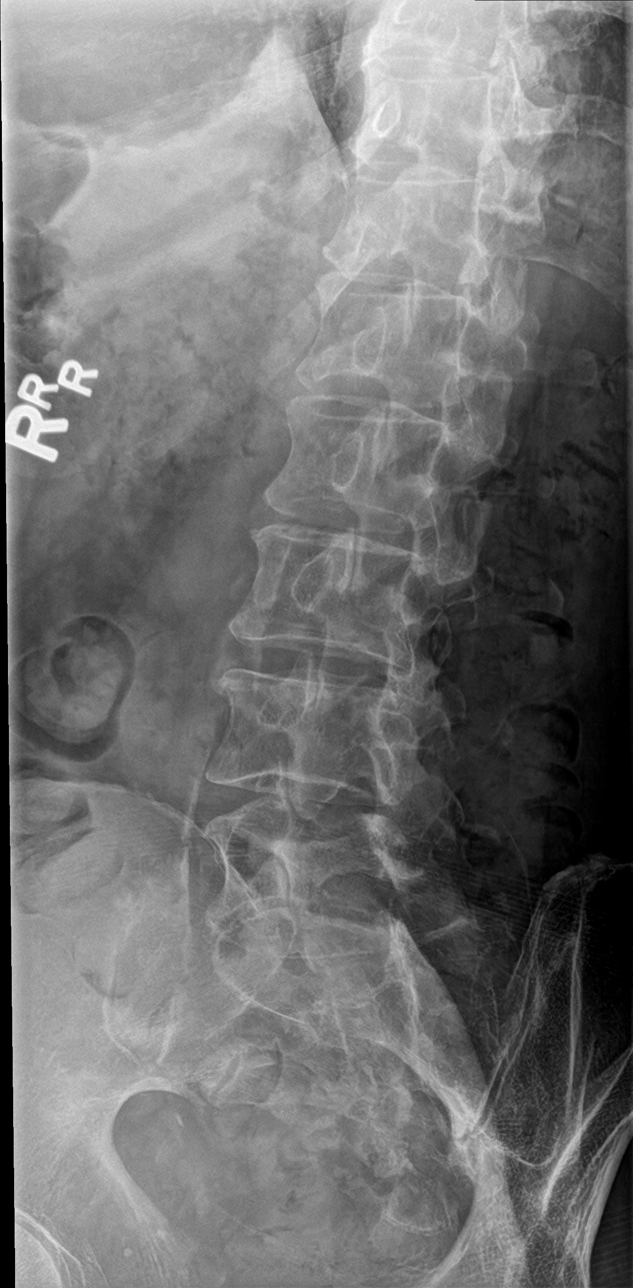

[l-spine obl (2 of 2)]
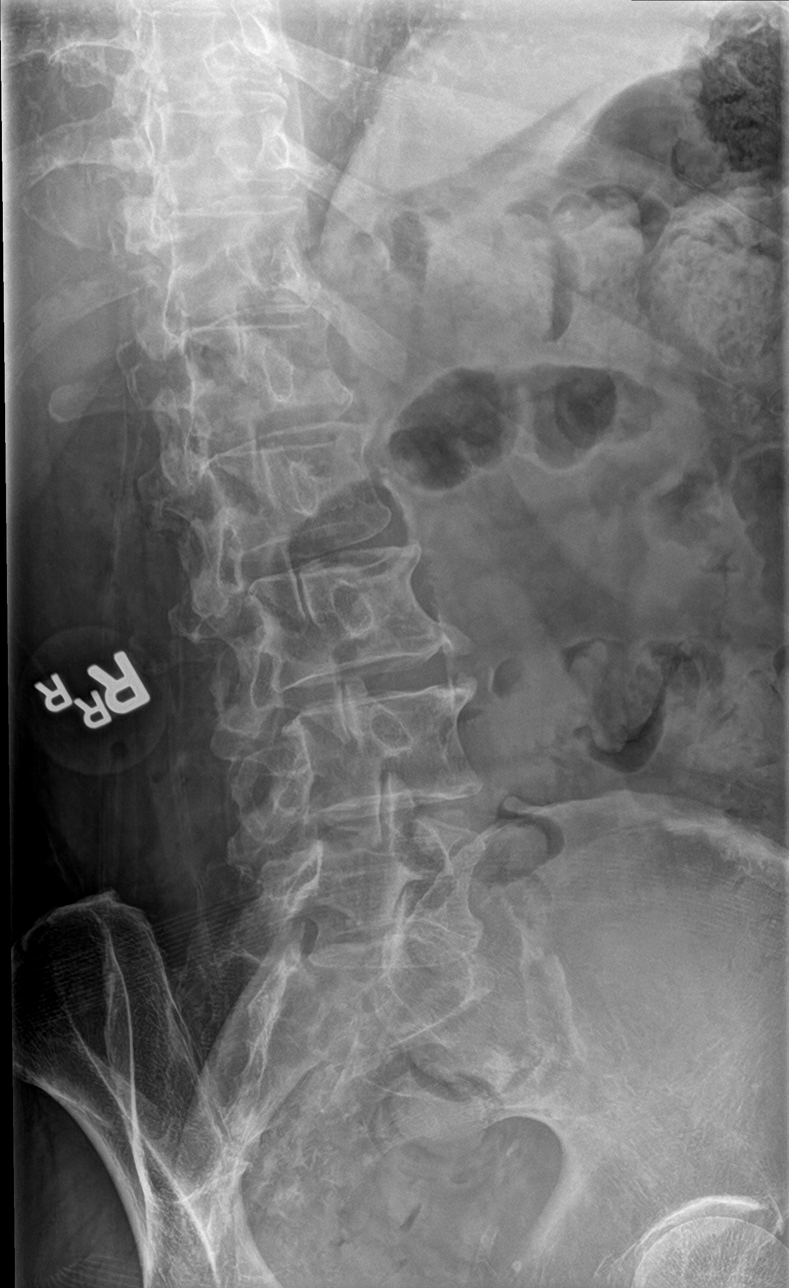

[l-spine lat]
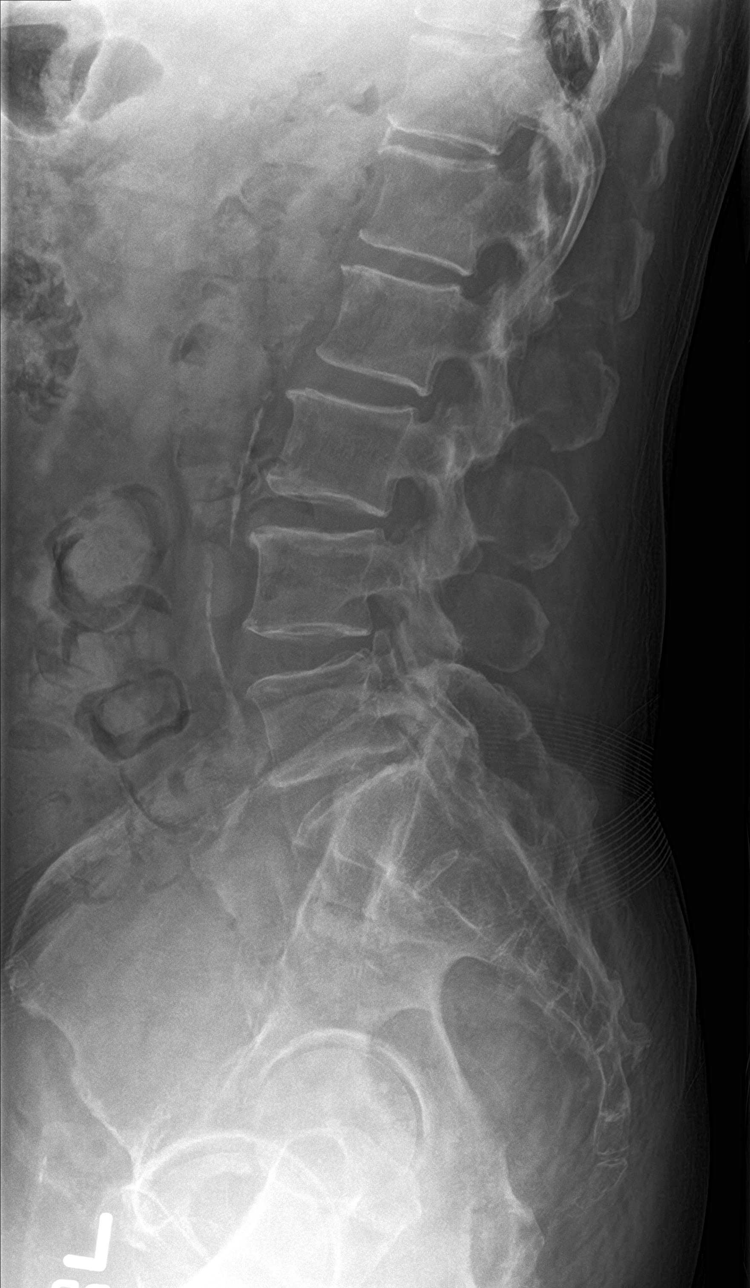

[l-spine spot]
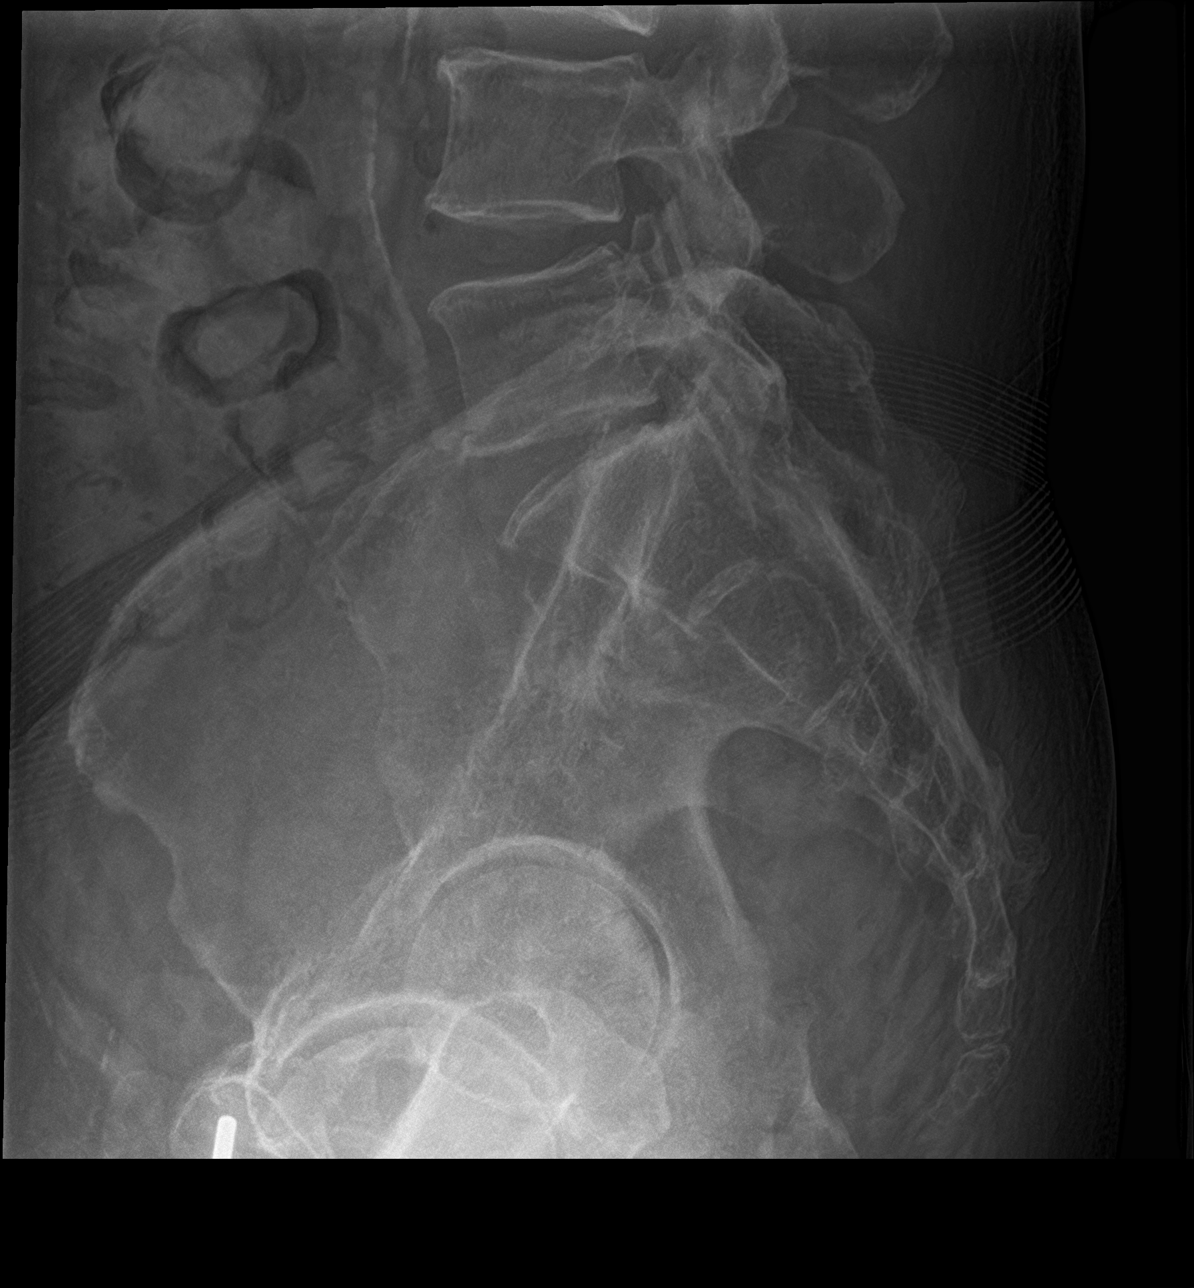

[5 of 5 positions shown; findings below may reference images not displayed]

FINDINGS: There is no evidence of lumbar spine fracture. Alignment is normal.
Intervertebral disc spaces are maintained. Minimal anterior
osteophyte formation is noted at L1-2, L2-3 and L3-4.
IMPRESSION: Minimal multilevel degenerative changes are noted. No acute
abnormality is noted.

Aortic Atherosclerosis ([BL]-[BL]).

## 2021-04-07 IMAGING — DX DG THORACIC SPINE 2V
3 series · 3 of 3 positions shown · non-contrast
Comparison: None.

CLINICAL DATA: Chronic back pain.

EXAM:
THORACIC SPINE 2 VIEWS

[t-spine ap]
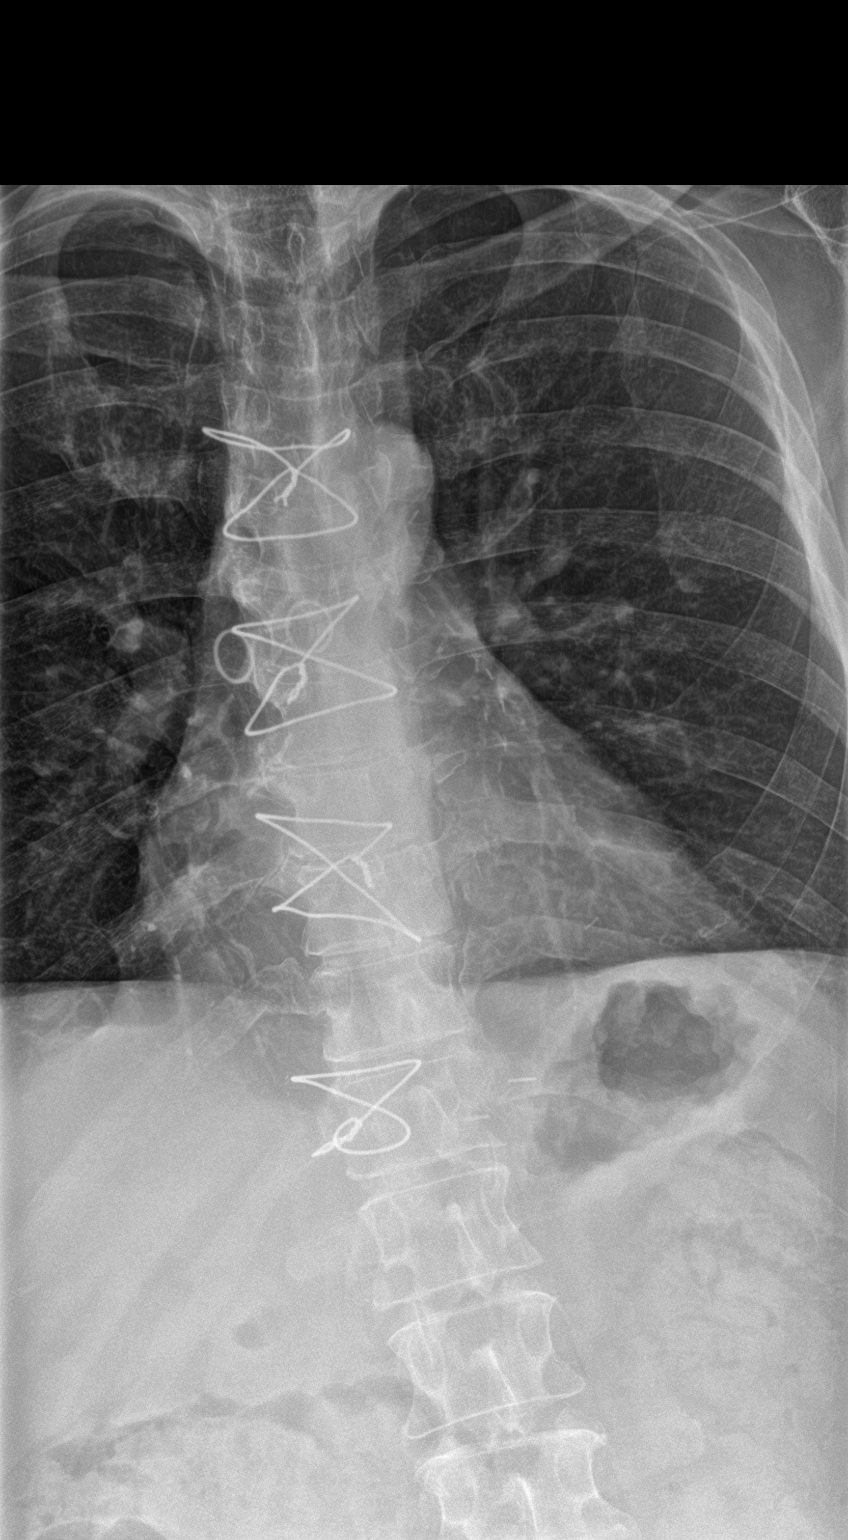

[t-spine lat]
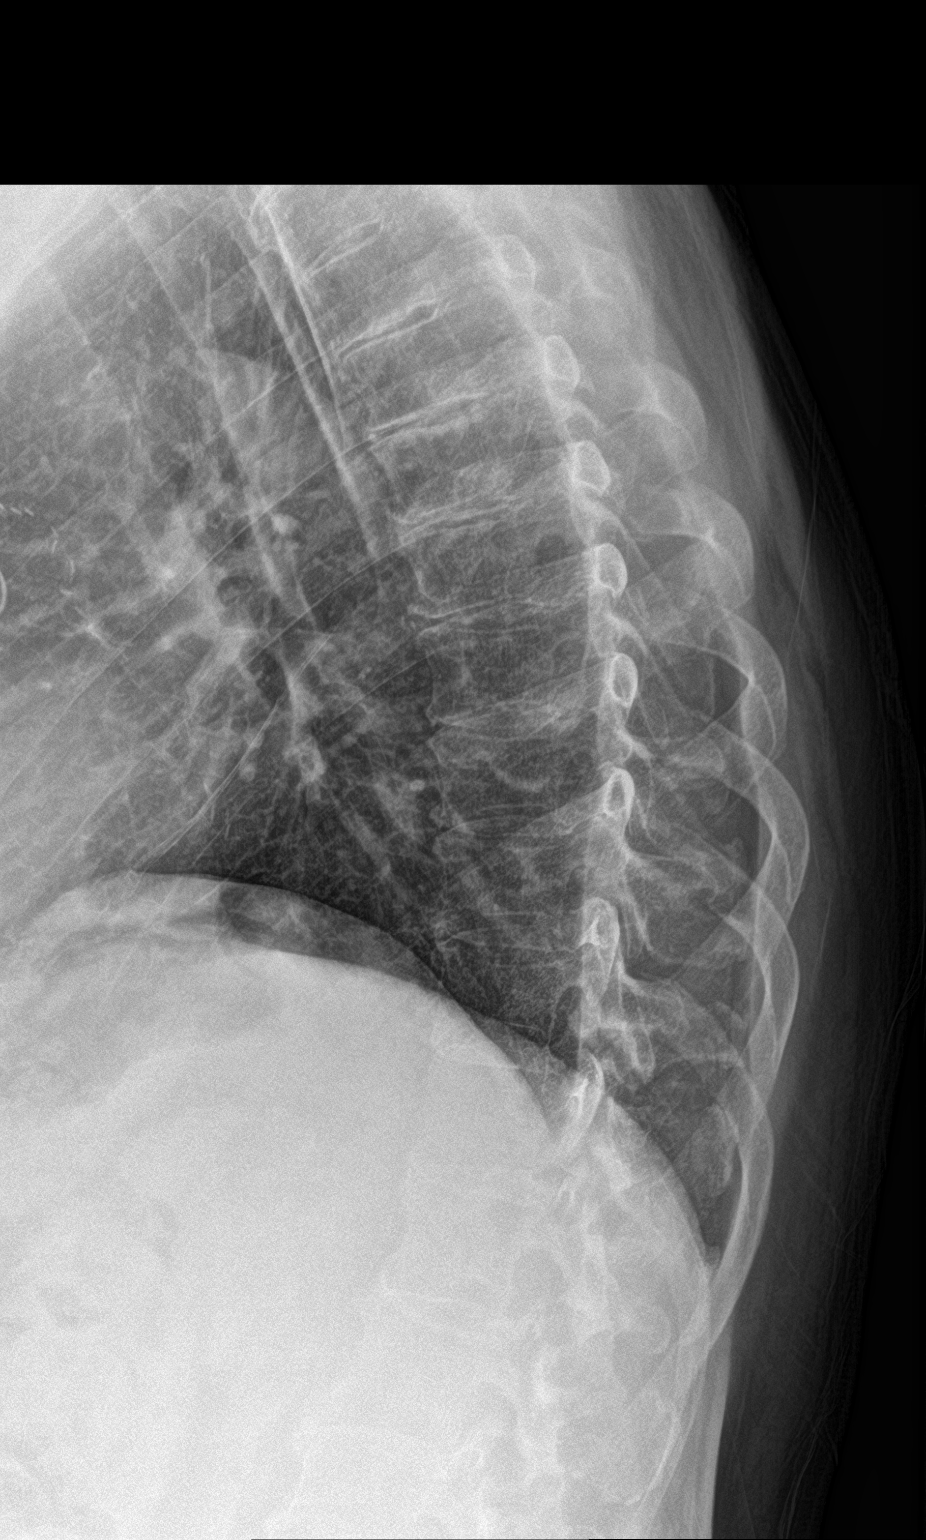

[t-spine swimmers]
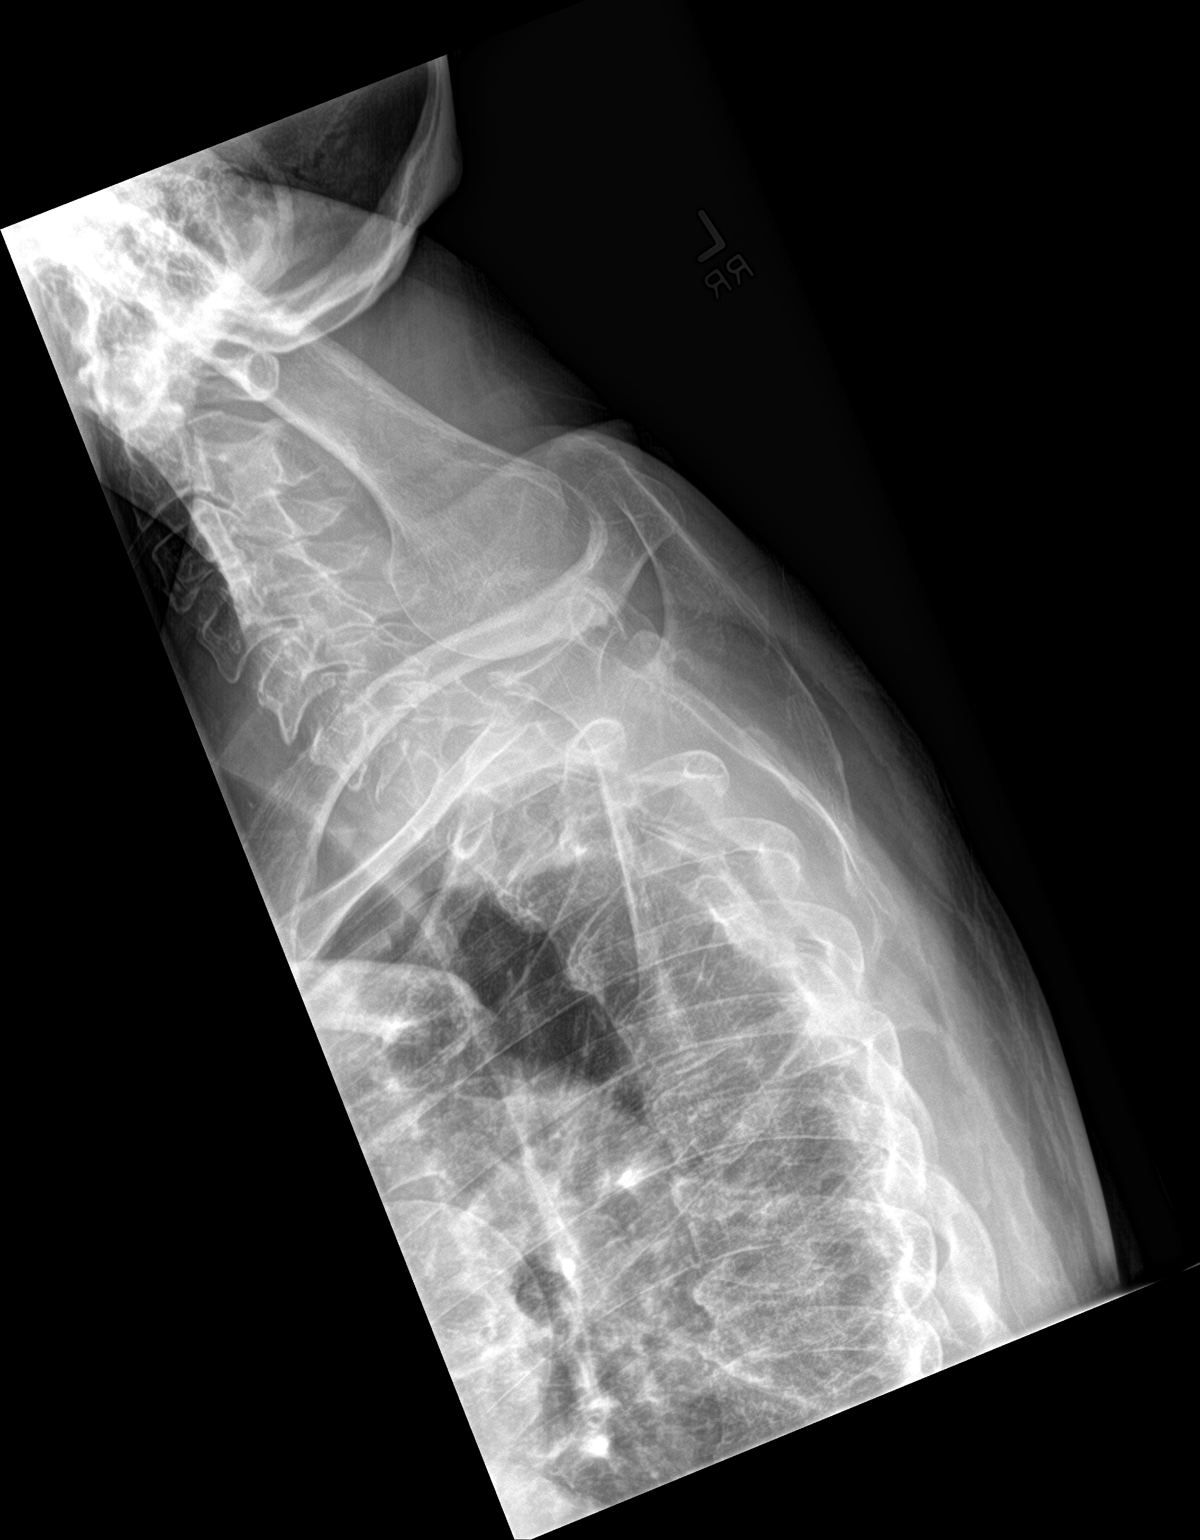

[3 of 3 positions shown; findings below may reference images not displayed]

FINDINGS: There is no evidence of thoracic spine fracture. Alignment is
normal. Mild multilevel degenerative changes are noted in
midthoracic spine.
IMPRESSION: Mild multilevel degenerative disc disease. No acute abnormality
seen.

## 2021-04-07 MED ORDER — TRAZODONE HCL 50 MG PO TABS
25.0000 mg | ORAL_TABLET | Freq: Every evening | ORAL | 3 refills | Status: DC | PRN
Start: 2021-04-07 — End: 2021-04-25

## 2021-04-07 NOTE — Patient Instructions (Signed)
Good to see you today!  Please go to the ground floor to have x-rays of your back; we will then plan to get you an MRI as well Referral made to neurology as well- urgent- to help [place] figure this out  Let me know if anything is changing or getting worse Try trazodone for sleep- start with a 1/2 tablet, increase to a whole tablet if needed

## 2021-04-12 ENCOUNTER — Ambulatory Visit
Admission: RE | Admit: 2021-04-12 | Discharge: 2021-04-12 | Disposition: A | Discharge status: Home / Self Care | Payer: Managed Care, Other (non HMO) | Source: Ambulatory Visit | Attending: Family Medicine | Admitting: Family Medicine

## 2021-04-12 ENCOUNTER — Other Ambulatory Visit: Payer: Self-pay

## 2021-04-12 DIAGNOSIS — R29898 Other symptoms and signs involving the musculoskeletal system: Secondary | ICD-10-CM

## 2021-04-12 IMAGING — MR MR LUMBAR SPINE W/O CM
4 of 6 series · 24 of 48 positions shown · non-contrast
Comparison: Lumbar spine x-rays dated [DATE].

CLINICAL DATA: Chronic low back pain and bilateral leg weakness. No
injury or prior surgery.

EXAM:
MRI LUMBAR SPINE WITHOUT CONTRAST
TECHNIQUE: Multiplanar, multisequence MR imaging of the lumbar spine was
performed. No intravenous contrast was administered.

[Series 3: T2 · sagittal · 4.0mm · 1.09mm/px · 4 of 16 slices shown (1 of 3)]
[im 1/16]
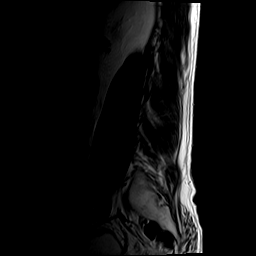
[im 6/16]
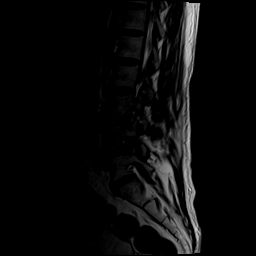
[im 11/16]
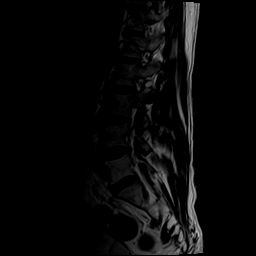
[im 16/16]
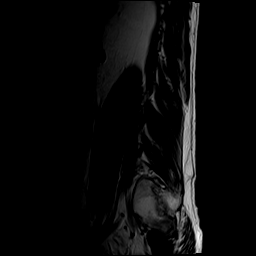

[Series 5: T1 · sagittal · 4.0mm · 1.09mm/px · 3 of 16 slices shown]
[im 1/16]
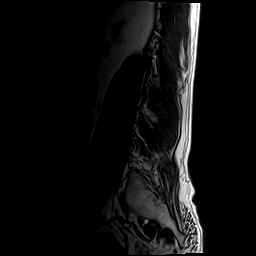
[im 11/16]
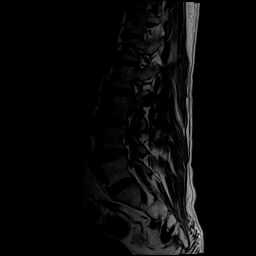
[im 16/16]
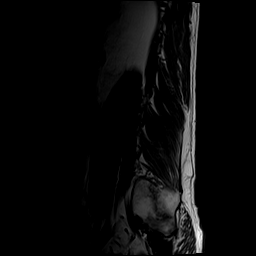

[Series 6: T2 · axial · 4.0mm · 0.39mm/px · z∈[-91,+132]mm · 9 of 42 slices shown (2 of 3)]
[im 1/42]
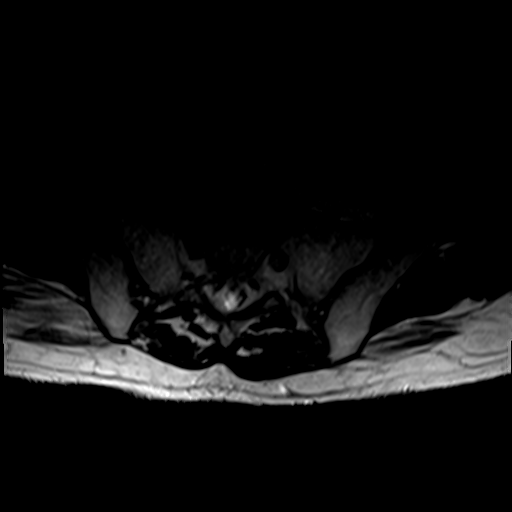
[im 8/42]
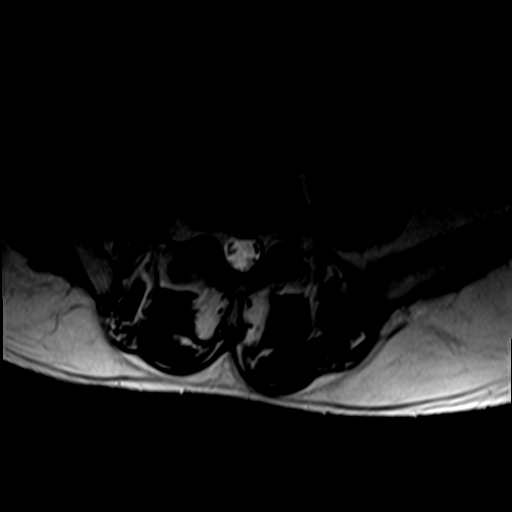
[im 12/42]
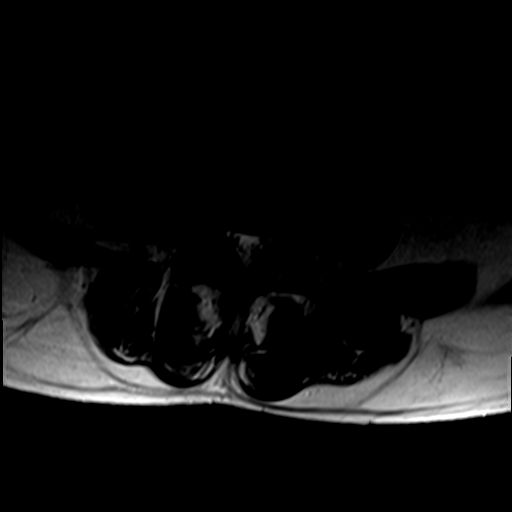
[im 19/42]
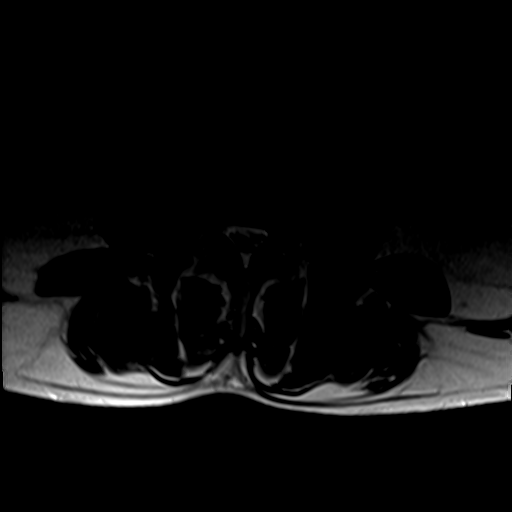
[im 23/42]
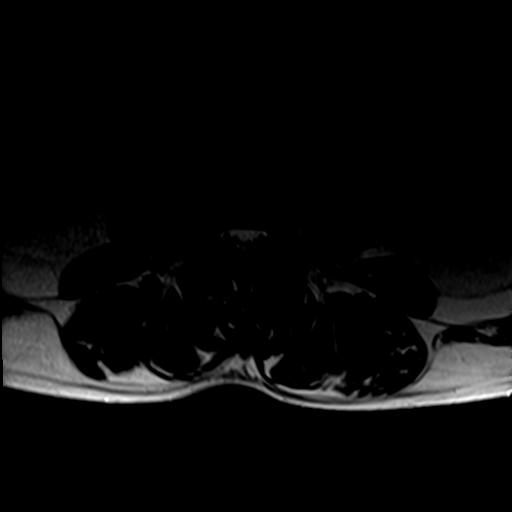
[im 30/42]
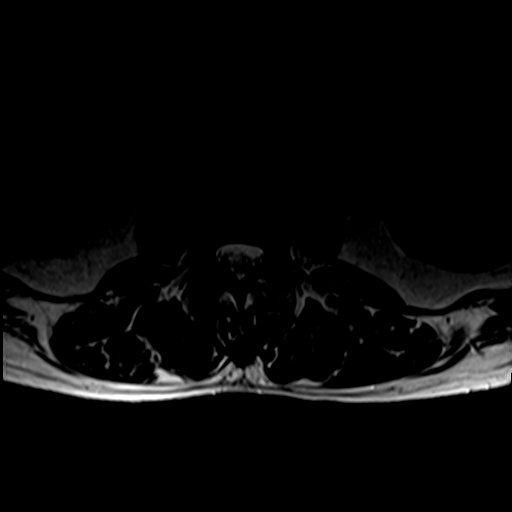
[im 34/42]
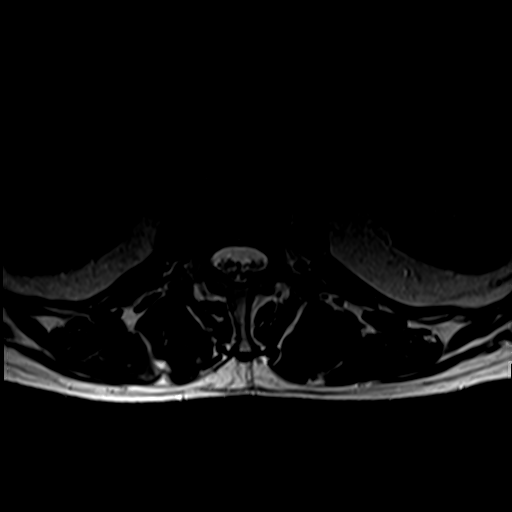
[im 38/42]
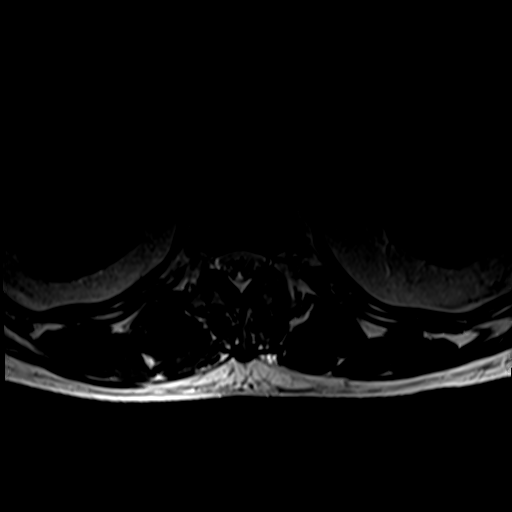
[im 42/42]
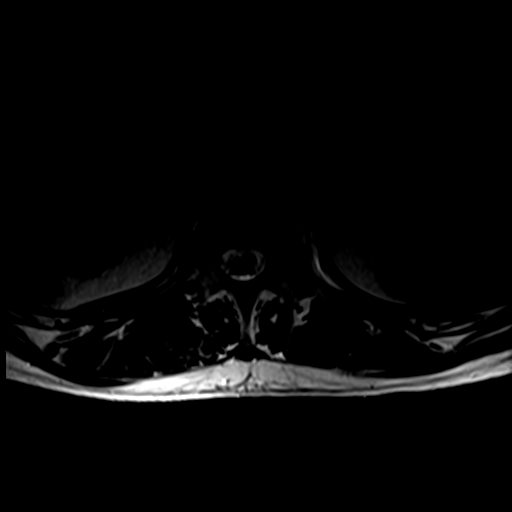

[Series 8: T2 · axial · 4.0mm · 0.39mm/px · z∈[-91,+113]mm · 8 of 42 slices shown (3 of 3)]
[im 1/42]
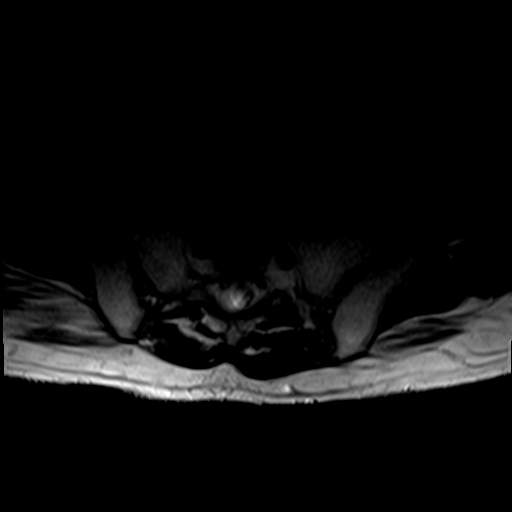
[im 8/42]
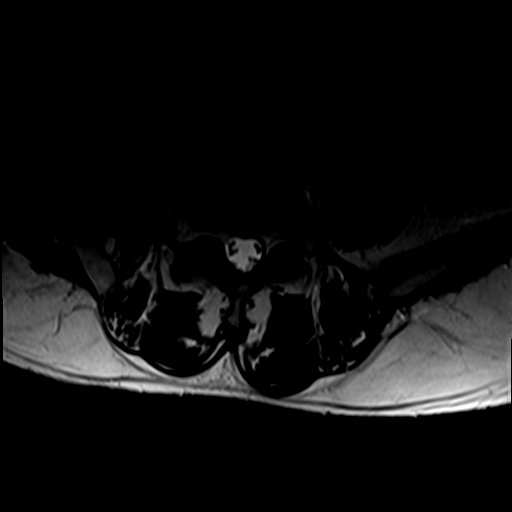
[im 12/42]
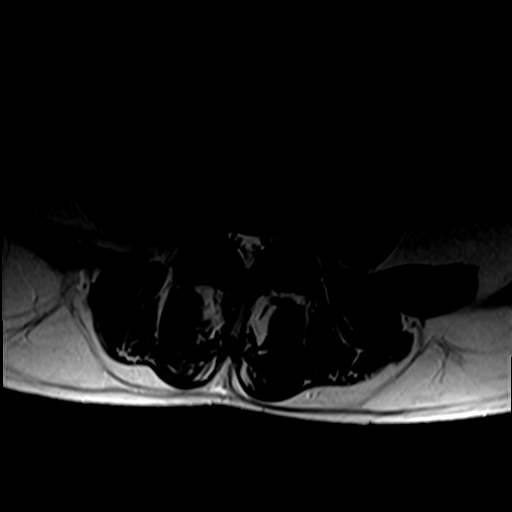
[im 19/42]
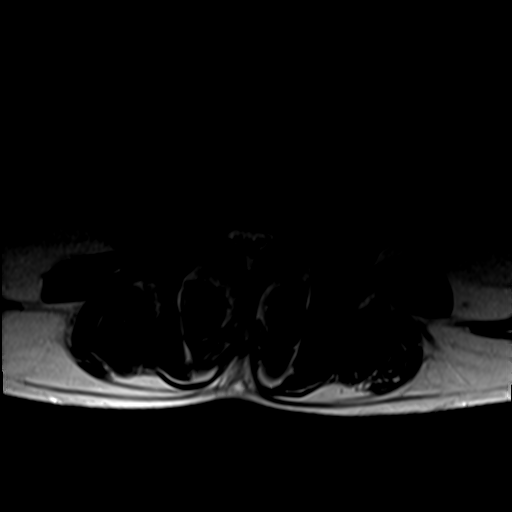
[im 23/42]
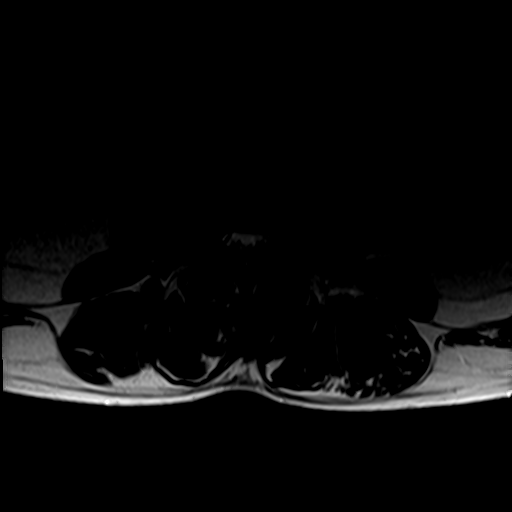
[im 30/42]
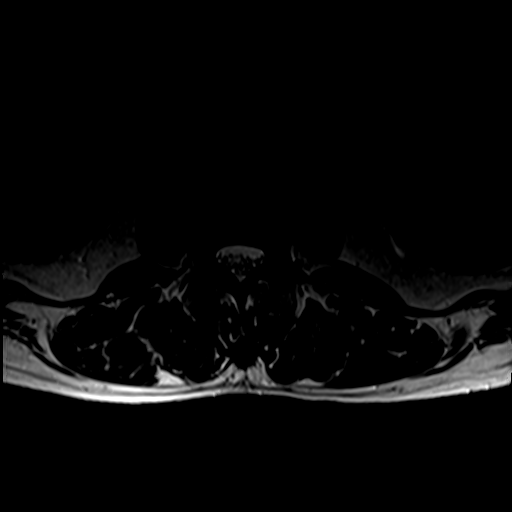
[im 34/42]
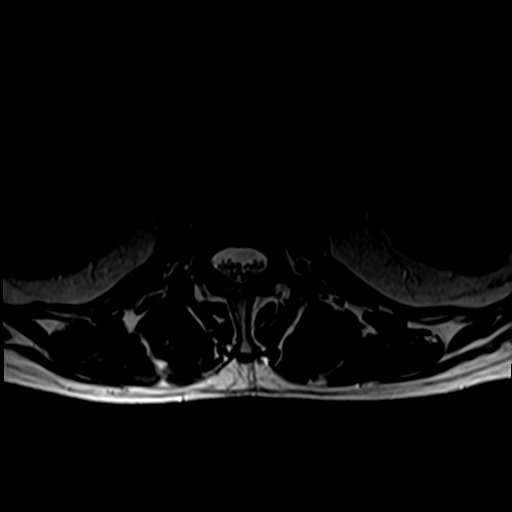
[im 38/42]
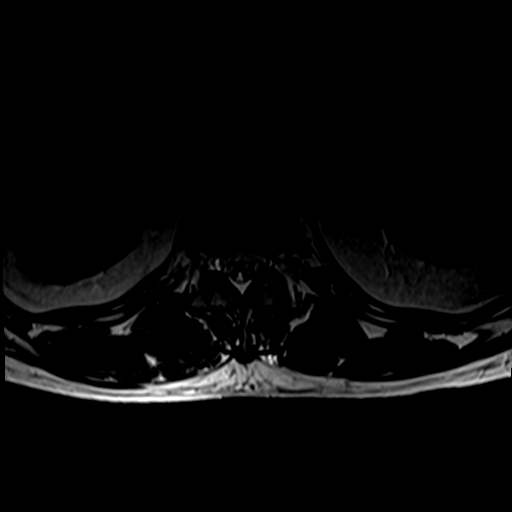

[24 of 48 positions shown; findings below may reference images not displayed]

FINDINGS: Segmentation:  Standard.

Alignment:  Physiologic.

Vertebrae:  No fracture, evidence of discitis, or bone lesion.

Conus medullaris and cauda equina: Conus extends to the L1 level.
Conus and cauda equina appear normal.

Paraspinal and other soft tissues: Negative.

Disc levels:

T12-L1:  Negative.

L1-L2:  Minimal disc bulging.  No stenosis.

L2-L3:  Mild disc bulging.  No stenosis.

L3-L4:  Mild disc bulging.  No stenosis.

L4-L5: Mild disc bulging. Right foraminal endplate spurring. Mild
bilateral facet arthropathy with small right synovial cyst extending
into the paraspinous muscles. Mild bilateral neuroforaminal
stenosis. No spinal canal stenosis.

L5-S1:  Tiny central disc protrusion.  No stenosis.
IMPRESSION: 1. Mild multilevel lumbar spondylosis as described above. Mild
bilateral neuroforaminal stenosis at L4-L5.

## 2021-04-13 ENCOUNTER — Encounter: Payer: Self-pay | Admitting: Family Medicine

## 2021-04-13 ENCOUNTER — Encounter: Payer: Self-pay | Admitting: Diagnostic Neuroimaging

## 2021-04-13 ENCOUNTER — Ambulatory Visit: Payer: Managed Care, Other (non HMO) | Admitting: Diagnostic Neuroimaging

## 2021-04-13 VITALS — BP 118/77 | HR 65 | Ht 66.0 in | Wt 136.5 lb

## 2021-04-13 DIAGNOSIS — R29898 Other symptoms and signs involving the musculoskeletal system: Secondary | ICD-10-CM

## 2021-04-13 MED ORDER — GABAPENTIN 300 MG PO CAPS: 300.0000 mg | ORAL_CAPSULE | Freq: Every day | ORAL | 3 refills | Status: DC

## 2021-04-13 NOTE — Progress Notes (Signed)
[facility]  [name] DOB: [birth year]  REFERRING CLINICIAN: [name], [name], MD HISTORY FROM: patient  REASON FOR VISIT: new consult    HISTORICAL  CHIEF COMPLAINT:  Chief Complaint  Patient presents with   New Patient (Initial Visit)    Rm 7 alone here for consult on worsening weakness. Pt reports lower back pain has become an issue for him and has caused balance disturbance. Reports sx have been on going for the last 6 months.     HISTORY OF PRESENT ILLNESS:   61 year old male with diabetes, coronary artery disease, hypertension, here for evaluation of gait and balance difficulty, lower extremity weakness, numbness, low back pain.  Patient has a history of diabetes since 2004.  He has been on and off medication since that time.  Around 2018 he developed numbness and tingling in his feet.  Around 2020 he was still able to play golf.  In 2021 he went off of his medications for diabetes.  Diabetes control significant worsened around that time.  In last 6 to 12 months he has significantly declined.  He is having general fatigue, weakness, pain, lower extremity problems.  The significantly worsened around September 2022.    REVIEW OF SYSTEMS: Full 14 system review of systems performed and negative with exception of: as per HPI.  ALLERGIES: No Known Allergies  HOME MEDICATIONS: Outpatient Medications Prior to Visit  Medication Sig Dispense Refill   aspirin EC 81 MG tablet Take 81 mg by mouth daily. Swallow whole.     blood glucose meter kit and supplies Dispense based on patient and insurance preference. Use up to four times daily as directed. (FOR ICD-10 E10.9, E11.9). 1 each 11   metFORMIN (GLUCOPHAGE-XR) 500 MG 24 hr tablet Take 2 tablets by mouth 2 (two) times daily.     rosuvastatin (CRESTOR) 40 MG tablet Take 40 mg by mouth daily.     traZODone (DESYREL) 50 MG tablet Take 0.5-1 tablets (25-50 mg total) by mouth at bedtime as needed for  sleep. 30 tablet 3   Insulin Glargine (BASAGLAR KWIKPEN) 100 UNIT/ML Inject 50 Units into the skin daily. Titration dose. Increase as directed 15 mL 1   tamsulosin (FLOMAX) 0.4 MG CAPS capsule Take 1 capsule (0.4 mg total) by mouth daily. 30 capsule 11   No facility-administered medications prior to visit.    PAST MEDICAL HISTORY: Past Medical History:  Diagnosis Date   Arthritis    Diabetes mellitus without complication ([place])    Heart disease    Hypertension     PAST SURGICAL HISTORY: Past Surgical History:  Procedure Laterality Date   CORONARY ARTERY BYPASS GRAFT      FAMILY HISTORY: Family History  Problem Relation Age of Onset   Diabetes Mother    Hypertension Mother    Diabetes Father    Hypertension Father    Heart attack Father    Diabetes Sister    Diabetes Sister     SOCIAL HISTORY: Social History   Socioeconomic History   Marital status: Single    Spouse name: Not on file   Number of children: 0   Years of education: masters   Highest education level: Not on file  Occupational History   Occupation: semi retired/technology  Tobacco Use   Smoking status: Never   Smokeless tobacco: Never  Vaping Use   Vaping Use: Never used  Substance and Sexual Activity   Alcohol use: Not Currently    Comment: none in 6 months  may open a bottle of wine tonight 11/1112022   Drug use: Never   Sexual activity: Not on file  Other Topics Concern   Not on file  Social History Narrative   Right handed-    Caffeine - 3 cups per day    Social Determinants of Health   Financial Resource Strain: Not on file  Food Insecurity: Not on file  Transportation Needs: Not on file  Physical Activity: Not on file  Stress: Not on file  Social Connections: Not on file  Intimate Partner Violence: Not on file     PHYSICAL EXAM  GENERAL EXAM/CONSTITUTIONAL: Vitals:  Vitals:   04/13/21 1124  BP: 118/77  Pulse: 65  Weight: 136 lb 8 oz (61.9 kg)  Height: $Remove'5\' 6"'jZZemME$  (1.676 m)    Body mass index is 22.03 kg/m. Wt Readings from Last 3 Encounters:  04/13/21 136 lb 8 oz (61.9 kg)  04/07/21 146 lb 9.6 oz (66.5 kg)  03/22/21 144 lb (65.3 kg)   Patient is in no distress; well developed, nourished and groomed; neck is supple  CARDIOVASCULAR: Examination of carotid arteries is normal; no carotid bruits Regular rate and rhythm, no murmurs Examination of peripheral vascular system by observation and palpation is normal  EYES: Ophthalmoscopic exam of optic discs and posterior segments is normal; no papilledema or hemorrhages No results found.  MUSCULOSKELETAL: Gait, strength, tone, movements noted in Neurologic exam below  NEUROLOGIC: MENTAL STATUS:  No flowsheet data found. awake, alert, oriented to person, place and time recent and remote memory intact normal attention and concentration language fluent, comprehension intact, naming intact fund of knowledge appropriate  CRANIAL NERVE:  2nd - no papilledema on fundoscopic exam 2nd, 3rd, 4th, 6th - pupils equal and reactive to light, visual fields full to confrontation, extraocular muscles intact, no nystagmus 5th - facial sensation symmetric 7th - facial strength symmetric 8th - hearing intact 9th - palate elevates symmetrically, uvula midline 11th - shoulder shrug symmetric 12th - tongue protrusion midline  MOTOR:  normal bulk and tone BUE 4+; BLE 4+  SENSORY:  normal and symmetric to light touch, temperature, vibration  COORDINATION:  finger-nose-finger, fine finger movements normal  REFLEXES:  deep tendon reflexes 1+ in arms; absent in lower ext  GAIT/STATION:  narrow based gait; STOOPED POSTURE; USING CANE; very unsteady     DIAGNOSTIC DATA (LABS, IMAGING, TESTING) - I reviewed patient records, labs, notes, testing and imaging myself where available.  Lab Results  Component Value Date   WBC 6.7 12/01/2020   HGB 13.6 12/01/2020   HCT 40.0 12/01/2020   MCV 89.7 12/01/2020   PLT  205 12/01/2020      Component Value Date/Time   NA 135 12/23/2020 1140   K 4.6 12/23/2020 1140   CL 98 12/23/2020 1140   CO2 14 (L) 12/23/2020 1140   GLUCOSE 360 (H) 12/23/2020 1140   GLUCOSE 682 (HH) 12/01/2020 1259   BUN 14 12/23/2020 1140   CREATININE 0.90 12/23/2020 1140   CALCIUM 9.5 12/23/2020 1140   GFRNONAA >60 12/01/2020 1259   Lab Results  Component Value Date   CHOL 328 (H) 12/23/2020   HDL 46 12/23/2020   LDLCALC 231 (H) 12/23/2020   TRIG 251 (H) 12/23/2020   CHOLHDL 7.1 (H) 12/23/2020   Lab Results  Component Value Date   HGBA1C >15.5 (H) 12/23/2020   No results found for: VITAMINB12 No results found for: TSH   04/12/21 MRI lumbar spine .[I reviewed images myself and agree with interpretation. -  [facility]]  1. Mild multilevel lumbar spondylosis as described above. Mild bilateral neuroforaminal stenosis at L4-L5.    ASSESSMENT AND PLAN  61 y.o. year old male here with uncontrolled diabetes here for evaluation of progressive low back pain, neck pain, lower extremity weakness, gait and balance difficulty since 2021, significantly worse in September 2022.   Ddx: diabetic neuropathy, diabetic lumbosacral plexopathy, cervical myelopathy, myopathy   1. Weakness of both lower extremities      PLAN:  - check neuropathy / myopathy labs - check EMG/NCS and MRI cervical spine - refer to PT evaluation; use walker - gabapentin $RemoveBefo'300mg'DTeCwOEooyW$  at bedtime for pain  Orders Placed This Encounter  Procedures   MR CERVICAL SPINE WO CONTRAST   CK   Aldolase   Vitamin B12   TSH   Vitamin B1   Ambulatory referral to Physical Therapy   NCV with EMG(electromyography)   Meds ordered this encounter  Medications   gabapentin (NEURONTIN) 300 MG capsule    Sig: Take 1 capsule (300 mg total) by mouth at bedtime.    Dispense:  30 capsule    Refill:  3   Return for for NCV/EMG, pending test results.  I spent 60 minutes of face-to-face and non-face-to-face time with patient.  This  included previsit chart review, lab review, study review, order entry, electronic health record documentation, patient education.     Penni Bombard, MD 0/0/6349, 4:94 PM Certified in Neurology, Neurophysiology and Neuroimaging  [facility] [address] [place], [place] [address] [phone]

## 2021-04-13 NOTE — Patient Instructions (Signed)
°-   check neuropathy / myopathy labs - refer to PT evaluation - gabapentin 300mg  at bedtime for pain

## 2021-04-14 ENCOUNTER — Telehealth: Payer: Self-pay | Admitting: Diagnostic Neuroimaging

## 2021-04-14 NOTE — Telephone Encounter (Signed)
I called to schedule test with Dr. Marjory Lies and the patient's phone went straight to voice mail. Per DPR, I did not leave him a voice mail. I sent a mychart message asking him to call the office to schedule.

## 2021-04-14 NOTE — Telephone Encounter (Signed)
[employer] order sent to GI, they will obtain the auth and reach out to the patient to schedule.  ?

## 2021-04-16 LAB — VITAMIN B1: Thiamine: 128.5 nmol/L (ref 66.5–200.0)

## 2021-04-16 LAB — VITAMIN B12: Vitamin B-12: 380 pg/mL (ref 232–1245)

## 2021-04-16 LAB — CK: Total CK: 250 U/L (ref 41–331)

## 2021-04-16 LAB — ALDOLASE: Aldolase: 8.1 U/L (ref 3.3–10.3)

## 2021-04-16 LAB — TSH: TSH: 2.13 u[IU]/mL (ref 0.450–4.500)

## 2021-04-21 ENCOUNTER — Other Ambulatory Visit: Payer: Self-pay

## 2021-04-21 ENCOUNTER — Encounter (HOSPITAL_COMMUNITY): Payer: Self-pay

## 2021-04-21 ENCOUNTER — Inpatient Hospital Stay (HOSPITAL_COMMUNITY)
Admission: EM | Admit: 2021-04-21 | Discharge: 2021-04-25 | DRG: 637 | Disposition: A | Discharge status: Home Health | Payer: Commercial Managed Care - HMO | Attending: Internal Medicine | Admitting: Internal Medicine

## 2021-04-21 DIAGNOSIS — T68XXXA Hypothermia, initial encounter: Secondary | ICD-10-CM | POA: Diagnosis present

## 2021-04-21 DIAGNOSIS — Z951 Presence of aortocoronary bypass graft: Secondary | ICD-10-CM

## 2021-04-21 DIAGNOSIS — D72829 Elevated white blood cell count, unspecified: Secondary | ICD-10-CM | POA: Diagnosis present

## 2021-04-21 DIAGNOSIS — R112 Nausea with vomiting, unspecified: Secondary | ICD-10-CM | POA: Diagnosis present

## 2021-04-21 DIAGNOSIS — R739 Hyperglycemia, unspecified: Secondary | ICD-10-CM

## 2021-04-21 DIAGNOSIS — E1110 Type 2 diabetes mellitus with ketoacidosis without coma: Secondary | ICD-10-CM | POA: Diagnosis not present

## 2021-04-21 DIAGNOSIS — Z9114 Patient's other noncompliance with medication regimen: Secondary | ICD-10-CM

## 2021-04-21 DIAGNOSIS — R339 Retention of urine, unspecified: Secondary | ICD-10-CM | POA: Diagnosis present

## 2021-04-21 DIAGNOSIS — Z833 Family history of diabetes mellitus: Secondary | ICD-10-CM

## 2021-04-21 DIAGNOSIS — G9341 Metabolic encephalopathy: Secondary | ICD-10-CM | POA: Diagnosis present

## 2021-04-21 DIAGNOSIS — Z79890 Hormone replacement therapy (postmenopausal): Secondary | ICD-10-CM

## 2021-04-21 DIAGNOSIS — E119 Type 2 diabetes mellitus without complications: Secondary | ICD-10-CM

## 2021-04-21 DIAGNOSIS — X58XXXA Exposure to other specified factors, initial encounter: Secondary | ICD-10-CM | POA: Diagnosis present

## 2021-04-21 DIAGNOSIS — M1990 Unspecified osteoarthritis, unspecified site: Secondary | ICD-10-CM | POA: Diagnosis present

## 2021-04-21 DIAGNOSIS — Z20822 Contact with and (suspected) exposure to covid-19: Secondary | ICD-10-CM | POA: Diagnosis present

## 2021-04-21 DIAGNOSIS — I10 Essential (primary) hypertension: Secondary | ICD-10-CM | POA: Diagnosis present

## 2021-04-21 DIAGNOSIS — E1010 Type 1 diabetes mellitus with ketoacidosis without coma: Secondary | ICD-10-CM

## 2021-04-21 DIAGNOSIS — Z7401 Bed confinement status: Secondary | ICD-10-CM

## 2021-04-21 DIAGNOSIS — R7989 Other specified abnormal findings of blood chemistry: Secondary | ICD-10-CM | POA: Diagnosis present

## 2021-04-21 DIAGNOSIS — Z7984 Long term (current) use of oral hypoglycemic drugs: Secondary | ICD-10-CM

## 2021-04-21 DIAGNOSIS — E785 Hyperlipidemia, unspecified: Secondary | ICD-10-CM | POA: Diagnosis present

## 2021-04-21 DIAGNOSIS — T383X6A Underdosing of insulin and oral hypoglycemic [antidiabetic] drugs, initial encounter: Secondary | ICD-10-CM | POA: Diagnosis present

## 2021-04-21 DIAGNOSIS — N19 Unspecified kidney failure: Secondary | ICD-10-CM | POA: Diagnosis not present

## 2021-04-21 DIAGNOSIS — E0800 Diabetes mellitus due to underlying condition with hyperosmolarity without nonketotic hyperglycemic-hyperosmolar coma (NKHHC): Secondary | ICD-10-CM

## 2021-04-21 DIAGNOSIS — Z7982 Long term (current) use of aspirin: Secondary | ICD-10-CM

## 2021-04-21 DIAGNOSIS — E1165 Type 2 diabetes mellitus with hyperglycemia: Secondary | ICD-10-CM

## 2021-04-21 DIAGNOSIS — E860 Dehydration: Secondary | ICD-10-CM | POA: Diagnosis present

## 2021-04-21 DIAGNOSIS — R197 Diarrhea, unspecified: Secondary | ICD-10-CM | POA: Diagnosis present

## 2021-04-21 DIAGNOSIS — R338 Other retention of urine: Secondary | ICD-10-CM | POA: Diagnosis present

## 2021-04-21 DIAGNOSIS — E118 Type 2 diabetes mellitus with unspecified complications: Secondary | ICD-10-CM

## 2021-04-21 DIAGNOSIS — Z794 Long term (current) use of insulin: Secondary | ICD-10-CM

## 2021-04-21 DIAGNOSIS — N471 Phimosis: Secondary | ICD-10-CM | POA: Diagnosis present

## 2021-04-21 DIAGNOSIS — Y92009 Unspecified place in unspecified non-institutional (private) residence as the place of occurrence of the external cause: Secondary | ICD-10-CM

## 2021-04-21 DIAGNOSIS — Z8249 Family history of ischemic heart disease and other diseases of the circulatory system: Secondary | ICD-10-CM

## 2021-04-21 DIAGNOSIS — G919 Hydrocephalus, unspecified: Secondary | ICD-10-CM | POA: Diagnosis present

## 2021-04-21 LAB — BASIC METABOLIC PANEL
BUN: 46 mg/dL — ABNORMAL HIGH (ref 6–20)
CO2: <7 mmol/L — ABNORMAL LOW (ref 22–32)
Calcium: 9.1 mg/dL (ref 8.9–10.3)
Chloride: 103 mmol/L (ref 98–111)
Creatinine, Ser: 1.16 mg/dL (ref 0.61–1.24)
GFR, Estimated: >60 mL/min (ref >60)
Glucose, Bld: 492 mg/dL — ABNORMAL HIGH (ref 70–99)
Potassium: 4.6 mmol/L (ref 3.5–5.1)
Sodium: 131 mmol/L — ABNORMAL LOW (ref 135–145)

## 2021-04-21 LAB — I-STAT CHEM 8, ED
BUN: 38 mg/dL — ABNORMAL HIGH (ref 6–20)
Calcium, Ion: 1.34 mmol/L (ref 1.15–1.40)
Chloride: 108 mmol/L (ref 98–111)
Creatinine, Ser: 0.8 mg/dL (ref 0.61–1.24)
Glucose, Bld: 455 mg/dL — ABNORMAL HIGH (ref 70–99)
HCT: 45 % (ref 39.0–52.0)
Hemoglobin: 15.3 g/dL (ref 13.0–17.0)
Potassium: 4.6 mmol/L (ref 3.5–5.1)
Sodium: 132 mmol/L — ABNORMAL LOW (ref 135–145)
TCO2: 7 mmol/L — ABNORMAL LOW (ref 22–32)

## 2021-04-21 LAB — CBG MONITORING, ED
Glucose-Capillary: 488 mg/dL — ABNORMAL HIGH (ref 70–99)
Glucose-Capillary: 437 mg/dL — ABNORMAL HIGH (ref 70–99)
Glucose-Capillary: 369 mg/dL — ABNORMAL HIGH (ref 70–99)
Glucose-Capillary: 428 mg/dL — ABNORMAL HIGH (ref 70–99)

## 2021-04-21 LAB — RESP PANEL BY RT-PCR (FLU A&B, COVID) ARPGX2
Influenza A by PCR: NEGATIVE
Influenza B by PCR: NEGATIVE
SARS Coronavirus 2 by RT PCR: NEGATIVE

## 2021-04-21 LAB — CBC
HCT: 47.3 % (ref 39.0–52.0)
Hemoglobin: 16.3 g/dL (ref 13.0–17.0)
MCH: 31.2 pg (ref 26.0–34.0)
MCHC: 34.5 g/dL (ref 30.0–36.0)
MCV: 90.4 fL (ref 80.0–100.0)
Platelets: 294 10*3/uL (ref 150–400)
RBC: 5.23 MIL/uL (ref 4.22–5.81)
RDW: 12.4 % (ref 11.5–15.5)
WBC: 19.2 10*3/uL — ABNORMAL HIGH (ref 4.0–10.5)
nRBC: 0 % (ref 0.0–0.2)

## 2021-04-21 LAB — BETA-HYDROXYBUTYRIC ACID: Beta-Hydroxybutyric Acid: >8 mmol/L — ABNORMAL HIGH (ref 0.05–0.27)

## 2021-04-21 LAB — MAGNESIUM: Magnesium: 2.2 mg/dL (ref 1.7–2.4)

## 2021-04-21 MED ORDER — CHLORHEXIDINE GLUCONATE CLOTH 2 % EX PADS
6.0000 | MEDICATED_PAD | Freq: Every day | CUTANEOUS | Status: DC
  Administered 2021-04-22 – 2021-04-24 (×4): 6 via TOPICAL

## 2021-04-21 MED ORDER — KCL IN DEXTROSE-NACL 10-5-0.45 MEQ/L-%-% IV SOLN
INTRAVENOUS | Status: DC
  Filled 2021-04-21 (×5): qty 1000

## 2021-04-21 MED ORDER — ONDANSETRON HCL 4 MG/2ML IJ SOLN
4.0000 mg | Freq: Four times a day (QID) | INTRAMUSCULAR | Status: DC | PRN
  Administered 2021-04-22: 4 mg via INTRAVENOUS
  Filled 2021-04-21: qty 2

## 2021-04-21 MED ORDER — DEXTROSE 50 % IV SOLN: 0.0000 mL | INTRAVENOUS | Status: DC | PRN

## 2021-04-21 MED ORDER — DEXTROSE IN LACTATED RINGERS 5 % IV SOLN: INTRAVENOUS | Status: DC

## 2021-04-21 MED ORDER — ACETAMINOPHEN 650 MG RE SUPP: 650.0000 mg | Freq: Four times a day (QID) | RECTAL | Status: DC | PRN

## 2021-04-21 MED ORDER — SODIUM CHLORIDE 0.45 % IV SOLN
INTRAVENOUS | Status: DC
  Filled 2021-04-21 (×5): qty 1000

## 2021-04-21 MED ORDER — ACETAMINOPHEN 325 MG PO TABS
650.0000 mg | ORAL_TABLET | Freq: Four times a day (QID) | ORAL | Status: DC | PRN
  Administered 2021-04-23 – 2021-04-24 (×2): 650 mg via ORAL
  Filled 2021-04-21 (×2): qty 2

## 2021-04-21 MED ORDER — INSULIN REGULAR(HUMAN) IN NACL 100-0.9 UT/100ML-% IV SOLN
INTRAVENOUS | Status: DC
  Administered 2021-04-21: 11.5 [IU]/h via INTRAVENOUS
  Administered 2021-04-22: 3.8 [IU]/h via INTRAVENOUS
  Filled 2021-04-21 (×2): qty 100

## 2021-04-21 MED ORDER — LACTATED RINGERS IV SOLN: INTRAVENOUS | Status: DC

## 2021-04-21 NOTE — ED Provider Notes (Addendum)
[facility] DEPT Provider Note   CSN: [id number] Arrival date & time: 04/21/21  1831     History  Chief Complaint  Patient presents with   Hyperglycemia    Charles [name] is a 61 y.o. male with medical history of diabetes type 1.  Patient presents to ED via EMS for evaluation of hyperglycemia.  Patient states that for the last 2 days he has been noncompliant on his diabetes medication for unknown reasons.  Patient has a history of noncompliance on diabetic medication.  Patient is endorsing abdominal pain, nausea, vomiting, excessive thirst, excessive hunger, excessive urination.  Patient denies fevers, chest pains, shortness of breath, diarrhea.   Hyperglycemia Associated symptoms: abdominal pain, increased thirst, nausea, polyuria and vomiting   Associated symptoms: no fever       Home Medications Prior to Admission medications   Medication Sig Start Date End Date Taking? Authorizing Provider  aspirin EC 81 MG tablet Take 81 mg by mouth daily. Swallow whole.   Yes [provider]  gabapentin (NEURONTIN) 300 MG capsule Take 1 capsule (300 mg total) by mouth at bedtime. Patient taking differently: Take 300 mg by mouth once a week. 04/13/21  Yes Penumalli, Earlean Polka, MD  HUMALOG KWIKPEN 100 UNIT/ML KwikPen 15 Units once a week. 03/21/21  Yes [provider]  Insulin Glargine (BASAGLAR KWIKPEN) 100 UNIT/ML Inject 10 Units into the skin once a week.   Yes [provider]  metFORMIN (GLUCOPHAGE) 1000 MG tablet Take 1,000 mg by mouth once a week. 04/19/21  Yes [provider]  rosuvastatin (CRESTOR) 40 MG tablet Take 40 mg by mouth once a week.   Yes [provider]  blood glucose meter kit and supplies Dispense based on patient and insurance preference. Use up to four times daily as directed. (FOR ICD-10 E10.9, E11.9). 12/23/20   de [place], Raymond J, MD  Insulin Glargine [facility]) 100 UNIT/ML Inject 50 Units into  the skin daily. Titration dose. Increase as directed 01/27/21   de [place], Blondell Reveal, MD  traZODone (DESYREL) 50 MG tablet Take 0.5-1 tablets (25-50 mg total) by mouth at bedtime as needed for sleep. Patient not taking: Reported on 04/21/2021 04/07/21   [name], [name], MD      Allergies    Patient has no known allergies.    Review of Systems   Review of Systems  Constitutional:  Negative for fever.  Gastrointestinal:  Positive for abdominal pain, nausea and vomiting. Negative for diarrhea.  Endocrine: Positive for polydipsia, polyphagia and polyuria.  All other systems reviewed and are negative.  Physical Exam Updated Vital Signs BP 125/63    Pulse 80    Temp (!) 92.1 F (33.4 C) (Rectal)    Resp 19    Ht $R'5\' 6"'WO$  (1.676 m)    SpO2 100%    BMI 22.03 kg/m  Physical Exam Vitals and nursing note reviewed.    ED Results / Procedures / Treatments   Labs (all labs ordered are listed, but only abnormal results are displayed) Labs Reviewed  CBC - Abnormal; Notable for the following components:      Result Value   WBC 19.2 (*)    All other components within normal limits  BASIC METABOLIC PANEL - Abnormal; Notable for the following components:   Sodium 131 (*)    CO2 <7 (*)    Glucose, Bld 492 (*)    BUN 46 (*)    All other components within normal limits  CBG MONITORING, ED - Abnormal; Notable for the following components:   Glucose-Capillary 488 (*)    All other components within normal limits  I-STAT CHEM 8, ED - Abnormal; Notable for the following components:   Sodium 132 (*)    BUN 38 (*)    Glucose, Bld 455 (*)    TCO2 7 (*)    All other components within normal limits  CBG MONITORING, ED - Abnormal; Notable for the following components:   Glucose-Capillary 437 (*)    All other components within normal limits  URINALYSIS, ROUTINE W REFLEX MICROSCOPIC  CBG MONITORING, ED    EKG None  Radiology No results found.  Procedures .Critical Care Performed by: Azucena Cecil, PA-C Authorized by: Azucena Cecil, PA-C   Critical care provider statement:    Critical care time (minutes):  45   Critical care time was exclusive of:  Separately billable procedures and treating other patients   Critical care was necessary to treat or prevent imminent or life-threatening deterioration of the following conditions:  Endocrine crisis   Critical care was time spent personally by me on the following activities:  Blood draw for specimens, development of treatment plan with patient or surrogate, discussions with consultants, discussions with primary provider, evaluation of patient's response to treatment, examination of patient, interpretation of cardiac output measurements, obtaining history from patient or surrogate, vascular access procedures, review of old charts, re-evaluation of patient's condition, pulse oximetry, ordering and review of radiographic studies, ordering and review of laboratory studies and ordering and performing treatments and interventions   I assumed direction of critical care for this patient from another provider in my specialty: no     Care discussed with: admitting provider      Medications Ordered in ED Medications  insulin regular, human (MYXREDLIN) 100 units/ 100 mL infusion (11.5 Units/hr Intravenous New Bag/Given 04/21/21 2208)  lactated ringers infusion ( Intravenous New Bag/Given 04/21/21 2206)  dextrose 5 % in lactated ringers infusion (has no administration in time range)  dextrose 50 % solution 0-50 mL (has no administration in time range)    ED Course/ Medical Decision Making/ A&P                           Medical Decision Making Amount and/or Complexity of Data Reviewed Labs: ordered.  Risk Prescription drug management. Decision regarding hospitalization.   61 year old male presents due to hypoglycemia and hypothermia.  Patient has not been compliant on diabetes medication for the last 2 days for unknown reasons.   The patient states that he just "did not feel like taking his medication".  On exam, patient is hypothermic with a rectal temp of 90 Fahrenheit, nontachycardic, not hypoxic, clear lung sounds bilaterally.  Patient is complaining of abdominal pain.  Bear hugger was applied the patient.  Hypoglycemia order set was initiated, insulin and fluids were started.  Patient noted to have elevated anion gap of 24.  At this time, I contacted hospitalist for admission.  Dr. Nadara Mustard has agreed to admit the patient.  Patient stable at time of admission   Final Clinical Impression(s) / ED Diagnoses Final diagnoses:  Hyperglycemia  Hypothermia, initial encounter    Rx / DC Orders ED Discharge Orders     None               Lawana Chambers 04/21/21 2312    Daleen Bo, MD 04/22/21 [phone]

## 2021-04-21 NOTE — H&P (Signed)
History and Physical    PLEASE NOTE THAT DRAGON DICTATION SOFTWARE WAS USED IN THE CONSTRUCTION OF THIS NOTE.   Charles [name] RMF:[id number] DOB: [birth year] DOA: 04/21/2021  PCP: Charles Cables, MD  Patient coming from: home   I have personally briefly reviewed patient's old medical records in [facility]  Chief Complaint: Hyperglycemia  HPI: Charles [name] is a 61 y.o. male with medical history significant for type 1 diabetes mellitus, with most recent hemoglobin A1c noted to be greater than 15.5%, hyperlipidemia, who is admitted to [facility] on 04/21/2021 with suspected DKA after presenting from home to [facility] ED complaining of hyperglycemia.   In the setting of a documented history of type 1 diabetes mellitus, the patient reports that his blood sugar has been running high over the last 2 to 3 days, noting fasting as well as 2-hour postprandial blood sugars over that timeframe to be greater than 400.  Reports that this is in the context of acknowledgment of suboptimal compliance with his home insulin regimen which consists of insulin glargine 50 units subcu daily in the absence of any short acting insulin.  Specifically, he acknowledges missing multiple doses of his basal insulin per week on average.  Over the last 2 to 3 days, he also notes intermittent nausea resulting in 3-4 total episodes of nonbloody, nonbilious emesis, with most recent such episode of emesis occurring just prior to presenting to [facility] long emergency department earlier today.  He also notes worsening polydipsia/polyuria over the course of the last week.  Denies any recent subjective fever, chills, rigors, or generalized myalgias. Denies any recent headache, neck stiffness, rhinitis, rhinorrhea, sore throat, sob, wheezing, cough.  Denies any recent diarrhea, rash, melena, or hematochezia.  She also denies any recent dysuria, gross hematuria.  No recent traveling or known COVID-19 exposures.  Denies any recent  chest pain, diaphoresis, palpitations, dizziness.  Denies any recent alcohol consumption or use of recreational drugs.   Per chart review, most recent A1c noted to be greater than 15.5% when checked on 12/23/2020.  Denies any associated acute focal neurologic deficits, including no acute focal weakness, acute focal numbness, paresthesias, facial droop, dysarthria, expressive aphasia, acute change in vision, acute change in hearing, dysphagia, vertigo.      ED Course:  Vital signs in the ED were notable for the following: Temperature max 95.3; heart rate 74-94; blood pressure 111/67 - 133/67; respiratory rate 18-26, oxygen saturation 100% on room air.  Labs were notable for the following: BMP notable for the following: Sodium 131, which corrects to approximately 137.5 when taking into account concomitant hyperglycemia, potassium 4.6, bicarbonate less than 7, anion gap 21, BUN 46, creatinine 1.16, glucose 492.  This corresponded to CBG of 488.  CBC notable for white blood cell count 19,200, hemoglobin 16 x 3.  Beta hydroxybutyric acid greater than 8.  Urinalysis ordered, with result currently pending.  COVID-19/[place] PCR negative.  Imaging and additional notable ED work-up: None  While in the ED, the following were administered: Insulin drip initiated; lactate Ringer's 125 cc/h.  Subsequently, the patient was admitted for overnight observation to the stepdown unit for further evaluation and management of presenting DKA.      Review of Systems: As per HPI otherwise 10 point review of systems negative.   Past Medical History:  Diagnosis Date   Arthritis    Diabetes mellitus without complication (HCC)    Heart disease    Hypertension     Past Surgical History:  Procedure Laterality Date   CORONARY ARTERY BYPASS GRAFT      Social History:  reports that he has never smoked. He has never used smokeless tobacco. He reports that he does not currently use alcohol. He reports that he does  not use drugs.   No Known Allergies  Family History  Problem Relation Age of Onset   Diabetes Mother    Hypertension Mother    Diabetes Father    Hypertension Father    Heart attack Father    Diabetes Sister    Diabetes Sister     Family history reviewed and not pertinent    Prior to Admission medications   Medication Sig Start Date End Date Taking? Authorizing Provider  aspirin EC 81 MG tablet Take 81 mg by mouth daily. Swallow whole.   Yes [provider]  gabapentin (NEURONTIN) 300 MG capsule Take 1 capsule (300 mg total) by mouth at bedtime. Patient taking differently: Take 300 mg by mouth once a week. 04/13/21  Yes Penumalli, Earlean Polka, MD  HUMALOG KWIKPEN 100 UNIT/ML KwikPen 15 Units once a week. 03/21/21  Yes [provider]  Insulin Glargine (BASAGLAR KWIKPEN) 100 UNIT/ML Inject 10 Units into the skin once a week.   Yes [provider]  metFORMIN (GLUCOPHAGE) 1000 MG tablet Take 1,000 mg by mouth once a week. 04/19/21  Yes [provider]  rosuvastatin (CRESTOR) 40 MG tablet Take 40 mg by mouth once a week.   Yes [provider]  blood glucose meter kit and supplies Dispense based on patient and insurance preference. Use up to four times daily as directed. (FOR ICD-10 E10.9, E11.9). 12/23/20   de [name], Charles J, MD  Insulin Glargine [facility]) 100 UNIT/ML Inject 50 Units into the skin daily. Titration dose. Increase as directed 01/27/21   de [name], Charles Reveal, MD  traZODone (DESYREL) 50 MG tablet Take 0.5-1 tablets (25-50 mg total) by mouth at bedtime as needed for sleep. Patient not taking: Reported on 04/21/2021 04/07/21   [name], Charles Filler, MD     Objective    Physical Exam: Vitals:   04/21/21 2015 04/21/21 2030 04/21/21 2045 04/21/21 2057  BP: 133/67 127/70 125/63   Pulse: 78 78 80   Resp: (!) 21 (!) 22 19   Temp:    (!) 92.1 F (33.4 C)  TempSrc:    Rectal  SpO2: 100% 100% 100%   Height:        General: appears  to be stated age; alert, oriented Skin: warm, dry, no rash Head:  AT/[place] Mouth:  Oral mucosa membranes appear dry, normal dentition Neck: supple; trachea midline Heart:  RRR; did not appreciate any M/R/G Lungs: CTAB, did not appreciate any wheezes, rales, or rhonchi Abdomen: + BS; soft, ND, NT Vascular: 2+ pedal pulses b/l; 2+ radial pulses b/l Extremities: no peripheral edema, no muscle wasting Neuro: strength and sensation intact in upper and lower extremities b/l    Labs on Admission: I have personally reviewed following labs and imaging studies  CBC: Recent Labs  Lab 04/21/21 1935 04/21/21 2149  WBC 19.2*  --   HGB 16.3 15.3  HCT 47.3 45.0  MCV 90.4  --   PLT 294  --    Basic Metabolic Panel: Recent Labs  Lab 04/21/21 2147 04/21/21 2149  NA 131* 132*  K 4.6 4.6  CL 103 108  CO2 <7*  --   GLUCOSE 492* 455*  BUN 46* 38*  CREATININE 1.16 0.80  CALCIUM  9.1  --    GFR: Estimated Creatinine Clearance: 88.1 mL/min (by C-G formula based on SCr of 0.8 mg/dL). Liver Function Tests: No results for input(s): AST, ALT, ALKPHOS, BILITOT, PROT, ALBUMIN in the last 168 hours. No results for input(s): LIPASE, AMYLASE in the last 168 hours. No results for input(s): AMMONIA in the last 168 hours. Coagulation Profile: No results for input(s): INR, PROTIME in the last 168 hours. Cardiac Enzymes: No results for input(s): CKTOTAL, CKMB, CKMBINDEX, TROPONINI in the last 168 hours. BNP (last 3 results) No results for input(s): PROBNP in the last 8760 hours. HbA1C: No results for input(s): HGBA1C in the last 72 hours. CBG: Recent Labs  Lab 04/21/21 1926 04/21/21 2203 04/21/21 2238  GLUCAP 488* 437* 428*   Lipid Profile: No results for input(s): CHOL, HDL, LDLCALC, TRIG, CHOLHDL, LDLDIRECT in the last 72 hours. Thyroid Function Tests: No results for input(s): TSH, T4TOTAL, FREET4, T3FREE, THYROIDAB in the last 72 hours. Anemia Panel: No results for input(s): VITAMINB12,  FOLATE, FERRITIN, TIBC, IRON, RETICCTPCT in the last 72 hours. Urine analysis:    Component Value Date/Time   COLORURINE COLORLESS (A) 12/01/2020 1526   APPEARANCEUR Clear 02/08/2021 0923   LABSPEC 1.037 (H) 12/01/2020 1526   PHURINE 5.5 12/01/2020 1526   GLUCOSEU 3+ (A) 02/08/2021 0923   HGBUR NEGATIVE 12/01/2020 1526   BILIRUBINUR Negative 02/08/2021 0923   KETONESUR 40 (A) 12/01/2020 1526   PROTEINUR Negative 02/08/2021 0923   PROTEINUR NEGATIVE 12/01/2020 1526   NITRITE Negative 02/08/2021 0923   NITRITE NEGATIVE 12/01/2020 1526   LEUKOCYTESUR Negative 02/08/2021 0923   LEUKOCYTESUR LARGE (A) 12/01/2020 1526    Radiological Exams on Admission: No results found.    Assessment/Plan    Principal Problem:   DKA (diabetic ketoacidosis) (HCC) Active Problems:   Nausea & vomiting   Dehydration   Acute prerenal azotemia   Leukocytosis   HLD (hyperlipidemia)      #) Diabetic ketoacidosis: In the setting of known history of poorly controlled type 1 diabetes with most recent A1c of greater than 15.5% in October 2022,  on home insulin in the form of insulin glargine 50 units subcu daily in the absence of any short acting insulin, dx of dka on basis of the following laboratory eval: presenting blood sugar of 488 while CMP demonstrates anion gap metabolic acidosis with serum bicarbonate of less than 7 and AG of 21, and beta hydroxybutyric acid found to be elevated. Additional presenting labs notable for corrected serum sodium of 137.5 and serum potassium of 4.6.  Suspect contribution to presenting DKA from suboptimal compliance with outpatient insulin regimen with the patient acknowledging that he typically misses multiple doses of his basal insulin per week.  There may also be room for optimization of his outpatient insulin regimen given that is not appear to be on any short acting insulin at home. no e/o to suggest underlying precipitating infxn at this time,  including negative  COVID-19/influenza PCR, performed today.  However, will check UA and CXR to further assess.  While ACS is felt to be less likely in the absence of any recent chest pain, will also check EKG to further assess.  In the ED, insulin drip was initiated, and the following IVF administered: Initiation of LR at 125 cc/h.  Given that the patient's presenting potassium level is less than 5.0, will transition his existing IV fluids to half-normal saline with 10 mEq/L KCl at 125 cc/h at this time, with plan to add D5 to  existing IV fluids once blood sugar less than 250.     Plan: DKA protocol initiated. Insulin drip per Endotool protocol. Q1H cbg's. Q4H BMP's in order to monitor ensuing AG, Na, and potassium. NPO.  Discontinue existing LR, as above.  Start half-normal saline with 10 mEq/L KCl at 125 cc/hOnce Q1H cbg's reflect serum glucose less than 250, will add D5 to existing IVF's. Check serum Mg and Phos levels, w/ prn supplementation per protocol. Once AG has closed and patient tolerating PO, will reinitiate basal insulin while continuing insulin drip for an additional two hours to prevent rebound hyperglycemia, before ultimately turning off the insulin drip. Until then, holding home insulin. Monitor on telemetry. Monitor strict I's & O's.  Check A1c.  The importance of improved compliance with home insulin regimen was emphasized to patient. Prn IV Zofran.  Check UA, UDS. for completeness of DKA work-up, will also check CXR and EKG. Check UDS. Check UA.  Check VBG.      #) Leukocytosis: presenting white blood cell count of 19,200.  Suspect an element of hemoconcentration in the setting of clinical evidence of dehydration.  No overt evidence of underlying infectious process at this time, including negative COVID-19/influenza PCR.  However, will further expand infectious work-up by checking urinalysis as well as chest x-ray, as further detailed above.  No indication for empiric initiation of antibiotics in the  absence of any suspected underlying infectious source at this time.  Appears hemodynamically stable.   Plan: Further evaluation management of recent DKA, including plan for IV fluids as detailed above.  Check urinalysis, chest x-ray.  Repeat CMP/CBC in the morning.        #) Dehydration: Clinical suspicion for such, including the appearance of dry oral mucous membranes as well as laboratory findings notable for acute prerenal azotemia.  This is in the context of recent increase in GI losses in the form of 2 to 3 days of nausea/vomiting, as above, in addition to dehydrating effects of auto diuresis from glycemic gradient created by glycemic control relating to patient's diabetes.    Plan: Monitor strict I's and O's.  Daily weights.  Repeat BMP in the morning.  IV fluids as outlined above as component of plan for presenting DKA.  Check UA.         #) Hyperlipidemia: documented h/o such. On high intensity rosuvastatin as outpatient.    Plan: In the setting of current n.p.o. status as component of management of DKA, will hold home statin for now.       DVT prophylaxis: SCD's   Code Status: Full code Family Communication: none Disposition Plan: Per Rounding Team Consults called: none;  Admission status: Observation; SDU   PLEASE NOTE THAT DRAGON DICTATION SOFTWARE WAS USED IN THE CONSTRUCTION OF THIS NOTE.   [place] DO [facility] [place]   04/21/2021, 10:41 PM

## 2021-04-21 NOTE — ED Provider Notes (Signed)
°  Face-to-face evaluation   History: Patient presents to the ED for persistent weakness and blood sugar problems.  He states his weakness is in his legs only.  He lives alone.  He states he has been falling earlier this week and bedbound for the last 2 to 3 days.  Today, his neighbor came to his home and called EMS to help him.  Physical exam: Alert patient who appears older than stated age.  His mouth is very dry.  No dysarthria or aphasia.  He is confused.  MDM: Evaluation for  Chief Complaint  Patient presents with   Hyperglycemia     Medication noncompliance for use of insulin to treat diabetes.  Nonspecific leg weakness, being worked up by neurology.  He had a recent lumbar MRI which did not show acute abnormalities.  No signs or symptoms of acute brain or upper spine disorders.  He will require hospitalization for hypoglycemia and hypothermia.  Unclear what is causing his hypothermia.  He has been very inactive recently and apparently keeps his house very cold.  Medical screening examination/treatment/procedure(s) were conducted as a shared visit with non-physician practitioner(s) and myself.  I personally evaluated the patient during the encounter    Mancel Bale, MD 04/22/21 [phone]

## 2021-04-21 NOTE — ED Notes (Signed)
Bear hugger applied 

## 2021-04-21 NOTE — ED Notes (Signed)
Pt. I-stat Chem 8 results sodium 132 and glucose 455, PA made aware.

## 2021-04-21 NOTE — ED Notes (Signed)
Pt given cup of water 

## 2021-04-21 NOTE — ED Triage Notes (Signed)
Pt coming form home with c/o hyperglycemia. Pt's CBG was 488 with EMS before he received 500 ml NS bolus. Pt has hx of T1 DM. Pt states that they have been hyperglycemic for 2 days and have not taken any insulin for 2 days. He states he has insulin just has not wanted to take it and has not been eating. Pt c/o lack of appetite for x 2-3. Pt c/o weakness, dizzines, thirst, polyuria, and is tachypnic.

## 2021-04-22 ENCOUNTER — Inpatient Hospital Stay (HOSPITAL_COMMUNITY): Payer: Commercial Managed Care - HMO

## 2021-04-22 ENCOUNTER — Observation Stay (HOSPITAL_COMMUNITY): Payer: Commercial Managed Care - HMO

## 2021-04-22 ENCOUNTER — Encounter (HOSPITAL_COMMUNITY): Payer: Self-pay | Admitting: Internal Medicine

## 2021-04-22 DIAGNOSIS — G9341 Metabolic encephalopathy: Secondary | ICD-10-CM | POA: Diagnosis present

## 2021-04-22 DIAGNOSIS — E860 Dehydration: Secondary | ICD-10-CM | POA: Diagnosis present

## 2021-04-22 DIAGNOSIS — Z833 Family history of diabetes mellitus: Secondary | ICD-10-CM | POA: Diagnosis not present

## 2021-04-22 DIAGNOSIS — D72829 Elevated white blood cell count, unspecified: Secondary | ICD-10-CM | POA: Diagnosis present

## 2021-04-22 DIAGNOSIS — Z8249 Family history of ischemic heart disease and other diseases of the circulatory system: Secondary | ICD-10-CM | POA: Diagnosis not present

## 2021-04-22 DIAGNOSIS — Z9114 Patient's other noncompliance with medication regimen: Secondary | ICD-10-CM | POA: Diagnosis not present

## 2021-04-22 DIAGNOSIS — M1990 Unspecified osteoarthritis, unspecified site: Secondary | ICD-10-CM | POA: Diagnosis present

## 2021-04-22 DIAGNOSIS — I10 Essential (primary) hypertension: Secondary | ICD-10-CM | POA: Diagnosis present

## 2021-04-22 DIAGNOSIS — Z951 Presence of aortocoronary bypass graft: Secondary | ICD-10-CM | POA: Diagnosis not present

## 2021-04-22 DIAGNOSIS — E0810 Diabetes mellitus due to underlying condition with ketoacidosis without coma: Secondary | ICD-10-CM

## 2021-04-22 DIAGNOSIS — R197 Diarrhea, unspecified: Secondary | ICD-10-CM | POA: Diagnosis present

## 2021-04-22 DIAGNOSIS — X58XXXA Exposure to other specified factors, initial encounter: Secondary | ICD-10-CM | POA: Diagnosis present

## 2021-04-22 DIAGNOSIS — G919 Hydrocephalus, unspecified: Secondary | ICD-10-CM | POA: Diagnosis present

## 2021-04-22 DIAGNOSIS — N19 Unspecified kidney failure: Secondary | ICD-10-CM | POA: Diagnosis present

## 2021-04-22 DIAGNOSIS — Z7984 Long term (current) use of oral hypoglycemic drugs: Secondary | ICD-10-CM | POA: Diagnosis not present

## 2021-04-22 DIAGNOSIS — E1110 Type 2 diabetes mellitus with ketoacidosis without coma: Secondary | ICD-10-CM | POA: Diagnosis present

## 2021-04-22 DIAGNOSIS — Z794 Long term (current) use of insulin: Secondary | ICD-10-CM | POA: Diagnosis not present

## 2021-04-22 DIAGNOSIS — Z79890 Hormone replacement therapy (postmenopausal): Secondary | ICD-10-CM | POA: Diagnosis not present

## 2021-04-22 DIAGNOSIS — T383X6A Underdosing of insulin and oral hypoglycemic [antidiabetic] drugs, initial encounter: Secondary | ICD-10-CM | POA: Diagnosis present

## 2021-04-22 DIAGNOSIS — R338 Other retention of urine: Secondary | ICD-10-CM | POA: Diagnosis not present

## 2021-04-22 DIAGNOSIS — Z7401 Bed confinement status: Secondary | ICD-10-CM | POA: Diagnosis not present

## 2021-04-22 DIAGNOSIS — Z7982 Long term (current) use of aspirin: Secondary | ICD-10-CM | POA: Diagnosis not present

## 2021-04-22 DIAGNOSIS — E785 Hyperlipidemia, unspecified: Secondary | ICD-10-CM | POA: Diagnosis present

## 2021-04-22 DIAGNOSIS — R7989 Other specified abnormal findings of blood chemistry: Secondary | ICD-10-CM | POA: Diagnosis present

## 2021-04-22 DIAGNOSIS — T68XXXA Hypothermia, initial encounter: Secondary | ICD-10-CM | POA: Diagnosis present

## 2021-04-22 DIAGNOSIS — N471 Phimosis: Secondary | ICD-10-CM | POA: Diagnosis present

## 2021-04-22 DIAGNOSIS — R112 Nausea with vomiting, unspecified: Secondary | ICD-10-CM | POA: Diagnosis present

## 2021-04-22 DIAGNOSIS — Y92009 Unspecified place in unspecified non-institutional (private) residence as the place of occurrence of the external cause: Secondary | ICD-10-CM | POA: Diagnosis not present

## 2021-04-22 DIAGNOSIS — Z20822 Contact with and (suspected) exposure to covid-19: Secondary | ICD-10-CM | POA: Diagnosis present

## 2021-04-22 DIAGNOSIS — R339 Retention of urine, unspecified: Secondary | ICD-10-CM | POA: Diagnosis present

## 2021-04-22 LAB — CBC WITH DIFFERENTIAL/PLATELET
Abs Immature Granulocytes: 0.15 10*3/uL — ABNORMAL HIGH (ref 0.00–0.07)
Basophils Absolute: 0 10*3/uL (ref 0.0–0.1)
Basophils Relative: 0 %
Eosinophils Absolute: 0 10*3/uL (ref 0.0–0.5)
Eosinophils Relative: 0 %
HCT: 45.1 % (ref 39.0–52.0)
Hemoglobin: 16.2 g/dL (ref 13.0–17.0)
Immature Granulocytes: 1 %
Lymphocytes Relative: 6 %
Lymphs Abs: 1 10*3/uL (ref 0.7–4.0)
MCH: 31.8 pg (ref 26.0–34.0)
MCHC: 35.9 g/dL (ref 30.0–36.0)
MCV: 88.4 fL (ref 80.0–100.0)
Monocytes Absolute: 0.6 10*3/uL (ref 0.1–1.0)
Monocytes Relative: 4 %
Neutro Abs: 15.3 10*3/uL — ABNORMAL HIGH (ref 1.7–7.7)
Neutrophils Relative %: 89 %
Platelets: 273 10*3/uL (ref 150–400)
RBC: 5.1 MIL/uL (ref 4.22–5.81)
RDW: 12.1 % (ref 11.5–15.5)
WBC: 17.1 10*3/uL — ABNORMAL HIGH (ref 4.0–10.5)
nRBC: 0 % (ref 0.0–0.2)

## 2021-04-22 LAB — GLUCOSE, CAPILLARY
Glucose-Capillary: 120 mg/dL — ABNORMAL HIGH (ref 70–99)
Glucose-Capillary: 147 mg/dL — ABNORMAL HIGH (ref 70–99)
Glucose-Capillary: 155 mg/dL — ABNORMAL HIGH (ref 70–99)
Glucose-Capillary: 158 mg/dL — ABNORMAL HIGH (ref 70–99)
Glucose-Capillary: 172 mg/dL — ABNORMAL HIGH (ref 70–99)
Glucose-Capillary: 173 mg/dL — ABNORMAL HIGH (ref 70–99)
Glucose-Capillary: 178 mg/dL — ABNORMAL HIGH (ref 70–99)
Glucose-Capillary: 181 mg/dL — ABNORMAL HIGH (ref 70–99)
Glucose-Capillary: 183 mg/dL — ABNORMAL HIGH (ref 70–99)
Glucose-Capillary: 183 mg/dL — ABNORMAL HIGH (ref 70–99)
Glucose-Capillary: 183 mg/dL — ABNORMAL HIGH (ref 70–99)
Glucose-Capillary: 186 mg/dL — ABNORMAL HIGH (ref 70–99)
Glucose-Capillary: 187 mg/dL — ABNORMAL HIGH (ref 70–99)
Glucose-Capillary: 188 mg/dL — ABNORMAL HIGH (ref 70–99)
Glucose-Capillary: 200 mg/dL — ABNORMAL HIGH (ref 70–99)
Glucose-Capillary: 201 mg/dL — ABNORMAL HIGH (ref 70–99)
Glucose-Capillary: 201 mg/dL — ABNORMAL HIGH (ref 70–99)
Glucose-Capillary: 204 mg/dL — ABNORMAL HIGH (ref 70–99)
Glucose-Capillary: 208 mg/dL — ABNORMAL HIGH (ref 70–99)
Glucose-Capillary: 211 mg/dL — ABNORMAL HIGH (ref 70–99)
Glucose-Capillary: 212 mg/dL — ABNORMAL HIGH (ref 70–99)
Glucose-Capillary: 243 mg/dL — ABNORMAL HIGH (ref 70–99)

## 2021-04-22 LAB — RAPID URINE DRUG SCREEN, HOSP PERFORMED
Amphetamines: NOT DETECTED
Barbiturates: NOT DETECTED
Benzodiazepines: NOT DETECTED
Cocaine: NOT DETECTED
Opiates: NOT DETECTED
Tetrahydrocannabinol: NOT DETECTED

## 2021-04-22 LAB — BASIC METABOLIC PANEL
Anion gap: 18 — ABNORMAL HIGH (ref 5–15)
Anion gap: 6 (ref 5–15)
BUN: 43 mg/dL — ABNORMAL HIGH (ref 6–20)
BUN: 28 mg/dL — ABNORMAL HIGH (ref 6–20)
CO2: 10 mmol/L — ABNORMAL LOW (ref 22–32)
CO2: 18 mmol/L — ABNORMAL LOW (ref 22–32)
Calcium: 9.3 mg/dL (ref 8.9–10.3)
Calcium: 8.6 mg/dL — ABNORMAL LOW (ref 8.9–10.3)
Chloride: 107 mmol/L (ref 98–111)
Chloride: 113 mmol/L — ABNORMAL HIGH (ref 98–111)
Creatinine, Ser: 1.29 mg/dL — ABNORMAL HIGH (ref 0.61–1.24)
Creatinine, Ser: 0.69 mg/dL (ref 0.61–1.24)
GFR, Estimated: >60 mL/min (ref >60)
GFR, Estimated: >60 mL/min (ref >60)
Glucose, Bld: 175 mg/dL — ABNORMAL HIGH (ref 70–99)
Glucose, Bld: 182 mg/dL — ABNORMAL HIGH (ref 70–99)
Potassium: 3.7 mmol/L (ref 3.5–5.1)
Potassium: 3.2 mmol/L — ABNORMAL LOW (ref 3.5–5.1)
Sodium: 135 mmol/L (ref 135–145)
Sodium: 137 mmol/L (ref 135–145)

## 2021-04-22 LAB — URINALYSIS, ROUTINE W REFLEX MICROSCOPIC
Bilirubin Urine: NEGATIVE
Glucose, UA: >=500 mg/dL — AB
Hgb urine dipstick: NEGATIVE
Ketones, ur: 80 mg/dL — AB
Leukocytes,Ua: NEGATIVE
Nitrite: NEGATIVE
Protein, ur: NEGATIVE mg/dL
Specific Gravity, Urine: 1.023 (ref 1.005–1.030)
pH: 5 (ref 5.0–8.0)

## 2021-04-22 LAB — COMPREHENSIVE METABOLIC PANEL
ALT: 25 U/L (ref 0–44)
AST: 22 U/L (ref 15–41)
Albumin: 3.9 g/dL (ref 3.5–5.0)
Alkaline Phosphatase: 98 U/L (ref 38–126)
BUN: 44 mg/dL — ABNORMAL HIGH (ref 6–20)
CO2: <7 mmol/L — ABNORMAL LOW (ref 22–32)
Calcium: 9.7 mg/dL (ref 8.9–10.3)
Chloride: 108 mmol/L (ref 98–111)
Creatinine, Ser: 1.19 mg/dL (ref 0.61–1.24)
GFR, Estimated: >60 mL/min (ref >60)
Glucose, Bld: 208 mg/dL — ABNORMAL HIGH (ref 70–99)
Potassium: 3.9 mmol/L (ref 3.5–5.1)
Sodium: 136 mmol/L (ref 135–145)
Total Bilirubin: 0.9 mg/dL (ref 0.3–1.2)
Total Protein: 6.9 g/dL (ref 6.5–8.1)

## 2021-04-22 LAB — PHOSPHORUS: Phosphorus: 3.7 mg/dL (ref 2.5–4.6)

## 2021-04-22 LAB — ETHANOL: Alcohol, Ethyl (B): <10 mg/dL (ref <10)

## 2021-04-22 LAB — BETA-HYDROXYBUTYRIC ACID
Beta-Hydroxybutyric Acid: 5.75 mmol/L — ABNORMAL HIGH (ref 0.05–0.27)
Beta-Hydroxybutyric Acid: 3.73 mmol/L — ABNORMAL HIGH (ref 0.05–0.27)
Beta-Hydroxybutyric Acid: 3.56 mmol/L — ABNORMAL HIGH (ref 0.05–0.27)
Beta-Hydroxybutyric Acid: 1.43 mmol/L — ABNORMAL HIGH (ref 0.05–0.27)

## 2021-04-22 LAB — BLOOD GAS, VENOUS
Acid-base deficit: 9.5 mmol/L — ABNORMAL HIGH (ref 0.0–2.0)
Bicarbonate: 13.3 mmol/L — ABNORMAL LOW (ref 20.0–28.0)
O2 Saturation: 88.9 %
Patient temperature: 98.6
pCO2, Ven: 22.4 mmHg — ABNORMAL LOW (ref 44.0–60.0)
pH, Ven: 7.391 (ref 7.250–7.430)
pO2, Ven: 53.5 mmHg — ABNORMAL HIGH (ref 32.0–45.0)

## 2021-04-22 LAB — HEMOGLOBIN A1C
Hgb A1c MFr Bld: 10.4 % — ABNORMAL HIGH (ref 4.8–5.6)
Mean Plasma Glucose: 251.78 mg/dL

## 2021-04-22 LAB — VITAMIN B12: Vitamin B-12: 700 pg/mL (ref 180–914)

## 2021-04-22 LAB — HIV ANTIBODY (ROUTINE TESTING W REFLEX): HIV Screen 4th Generation wRfx: NONREACTIVE

## 2021-04-22 LAB — MAGNESIUM: Magnesium: 2 mg/dL (ref 1.7–2.4)

## 2021-04-22 LAB — MRSA NEXT GEN BY PCR, NASAL: MRSA by PCR Next Gen: NOT DETECTED

## 2021-04-22 LAB — AMMONIA: Ammonia: 37 umol/L — ABNORMAL HIGH (ref 9–35)

## 2021-04-22 LAB — TSH: TSH: 1.884 u[IU]/mL (ref 0.350–4.500)

## 2021-04-22 IMAGING — DX DG CHEST 1V PORT
1 series · 1 of 1 positions shown · non-contrast
Comparison: No priors.

CLINICAL DATA: 60-year-old male with history of diabetic
ketoacidosis.

EXAM:
PORTABLE CHEST 1 VIEW

[chest ap]
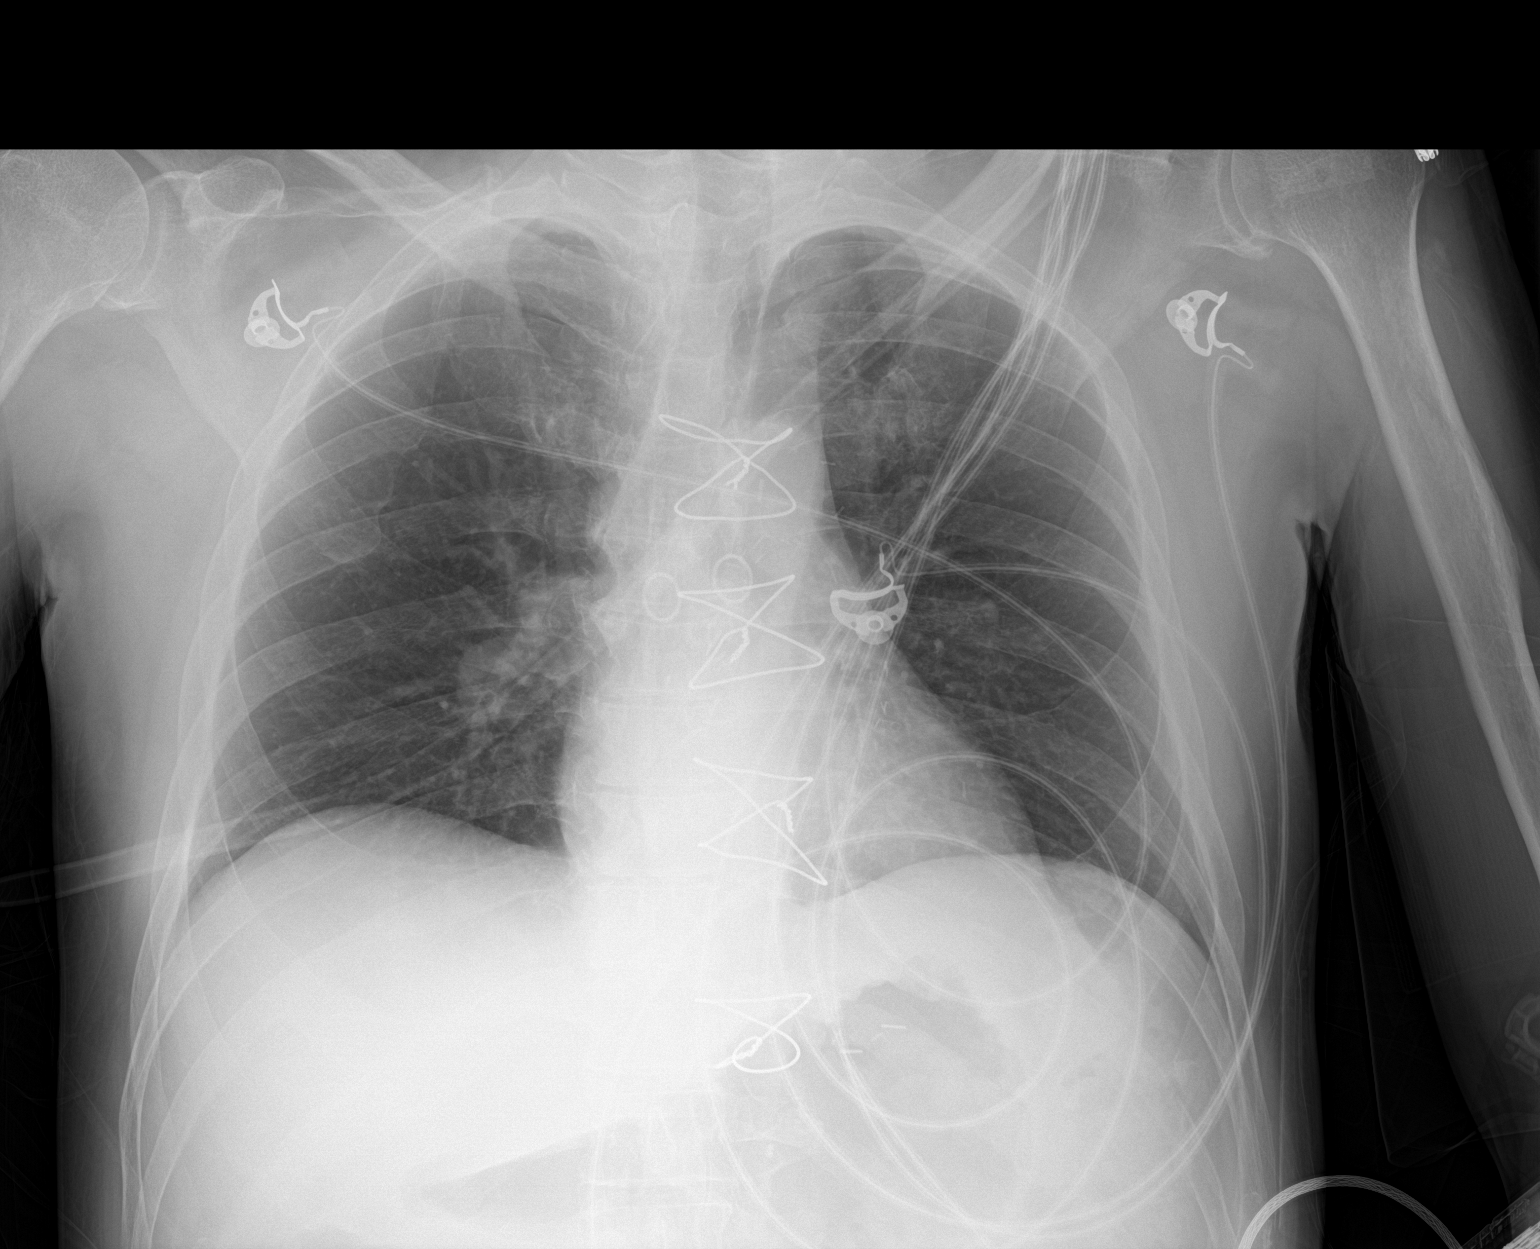

[1 of 1 positions shown; findings below may reference images not displayed]

FINDINGS: Lung volumes are low. No consolidative airspace disease. No pleural
effusions. No pneumothorax. No pulmonary nodule or mass noted.
Pulmonary vasculature and the cardiomediastinal silhouette are
within normal limits. Status post median sternotomy for CABG.
IMPRESSION: 1. Low lung volumes without radiographic evidence of acute
cardiopulmonary disease.

## 2021-04-22 IMAGING — CT CT HEAD W/O CM
5 of 8 series · 16 of 47 positions shown, 17 images · non-contrast
Comparison: None.
COMPARISON: None.

Addendum:
CLINICAL DATA: Mental status change, unknown cause



[Series 2: head bone · axial · 0.43mm/px · z∈[+1405,+1497]mm · 6 of 77 slices shown]
[im 8/77  bone]
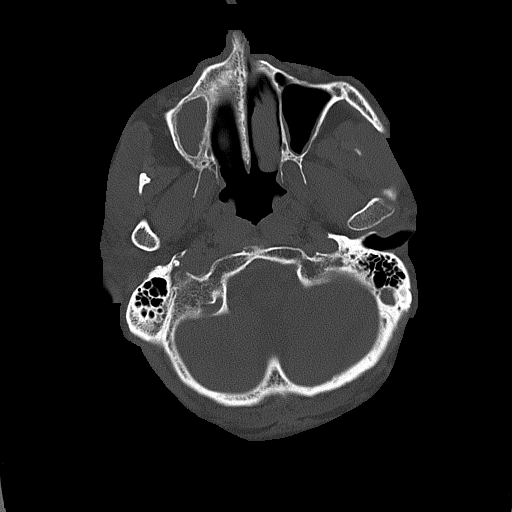
[im 16/77  bone]
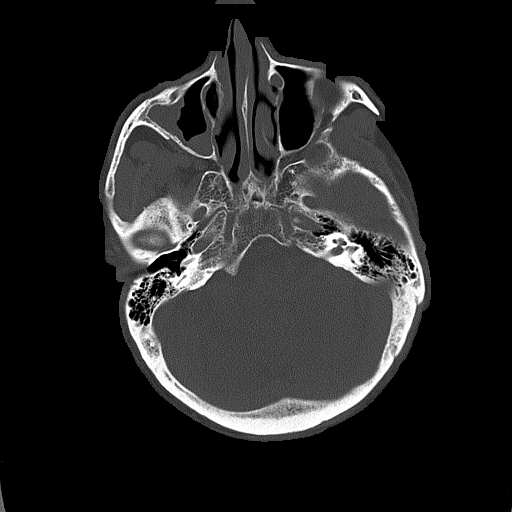
[im 23/77  bone]
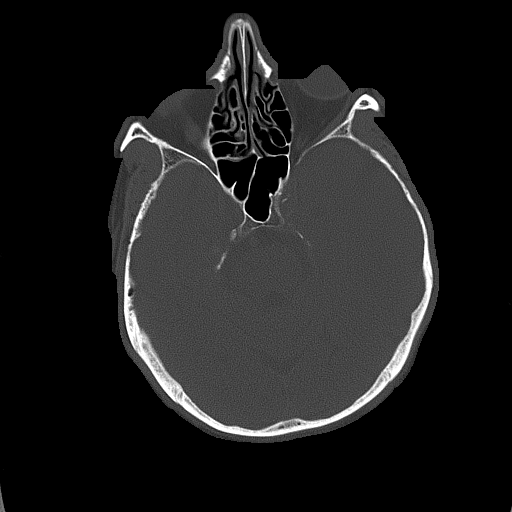
[im 31/77  bone]
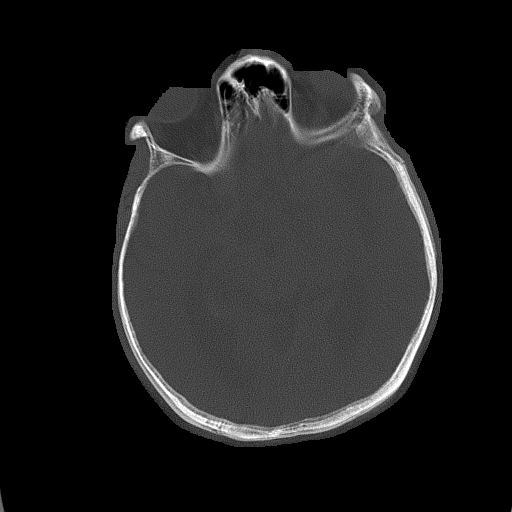
[im 46/77  bone]
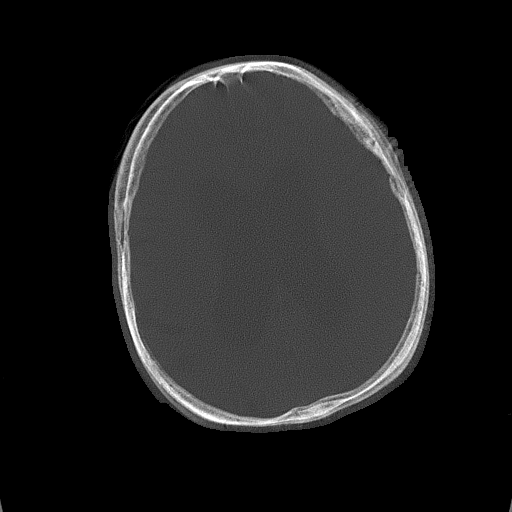
[im 54/77  bone]
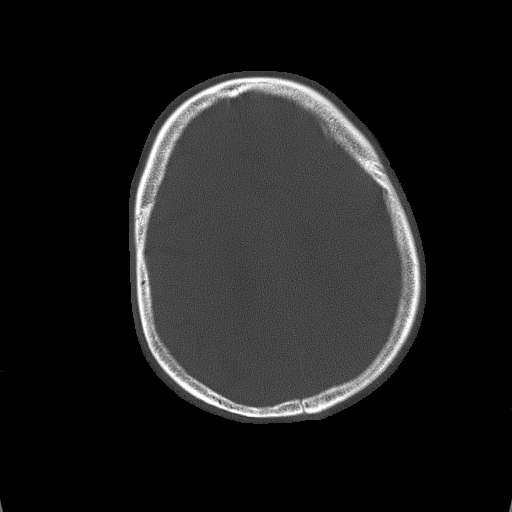

[Series 4: head wo · axial · 0.43mm/px · z∈[+1426,+1501]mm · 3 of 31 slices shown, 4 images (1 of 2)]
[im 8/31  brain]
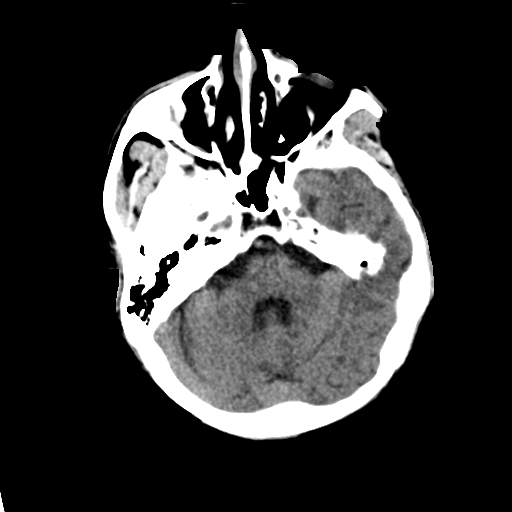
[im 8/31  bone]
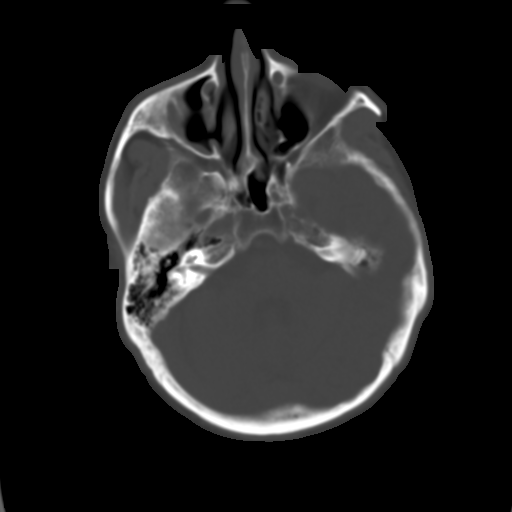
[im 16/31  brain]
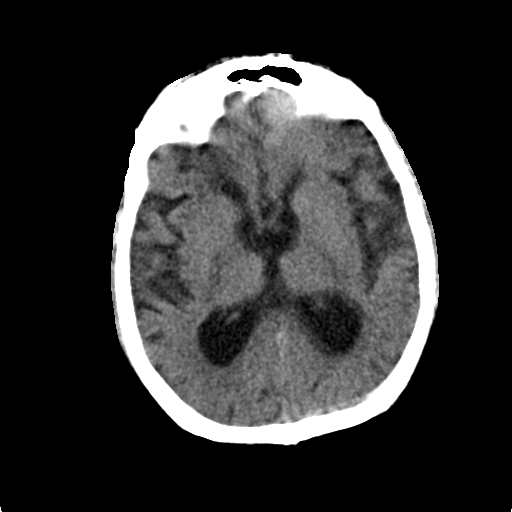
[im 23/31  brain]
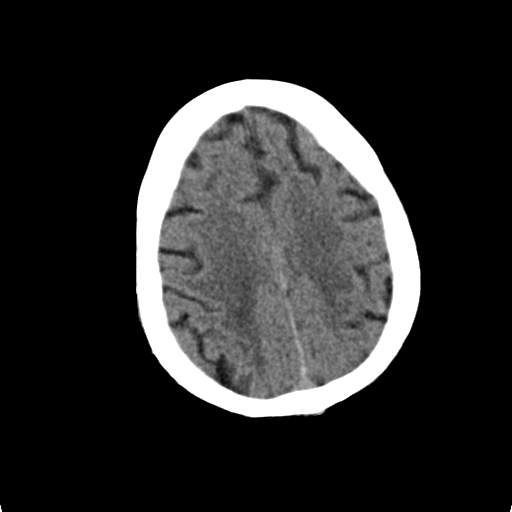

[Series 5: head wo · axial · 0.43mm/px · z∈[+1455,+1495]mm · 2 of 26 slices shown (2 of 2)]
[im 9/26  brain]
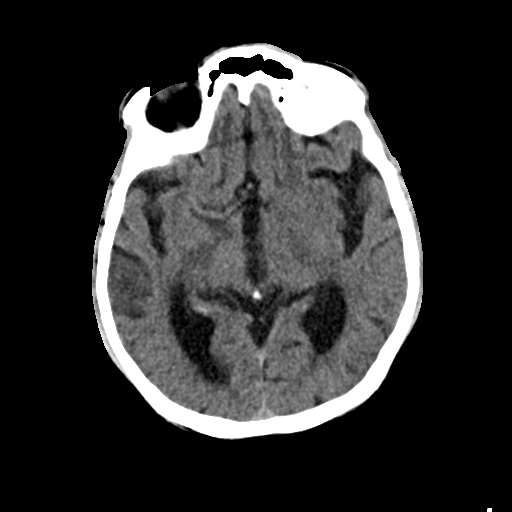
[im 17/26  brain]
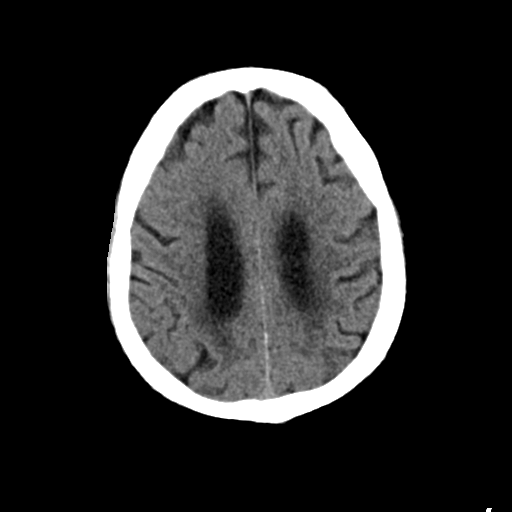

[Series 6: coronal soft tissue · coronal · 0.33mm/px · 3 of 65 slices shown]
[im 17/65  brain]
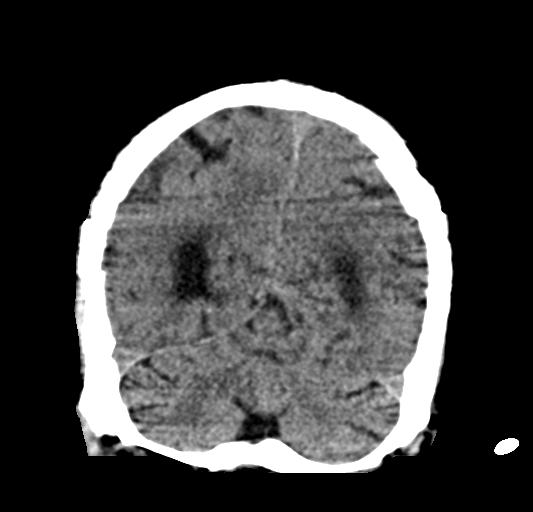
[im 33/65  brain]
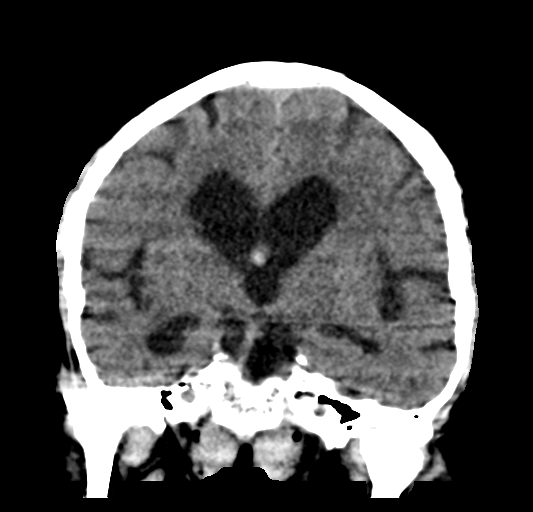
[im 49/65  brain]
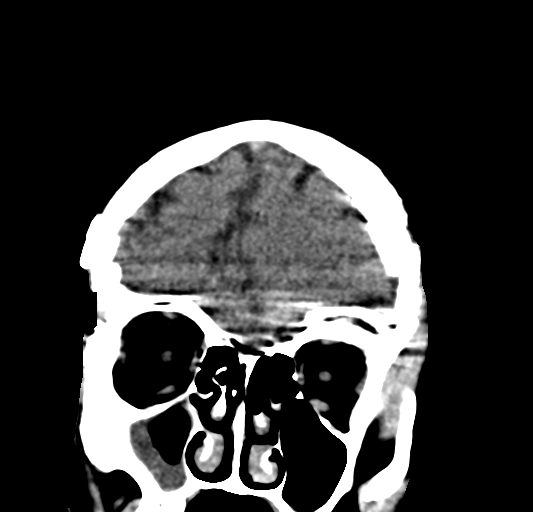

[Series 9: sagittal soft tissue · sagittal · 0.29mm/px · 2 of 55 slices shown]
[im 19/55  brain]
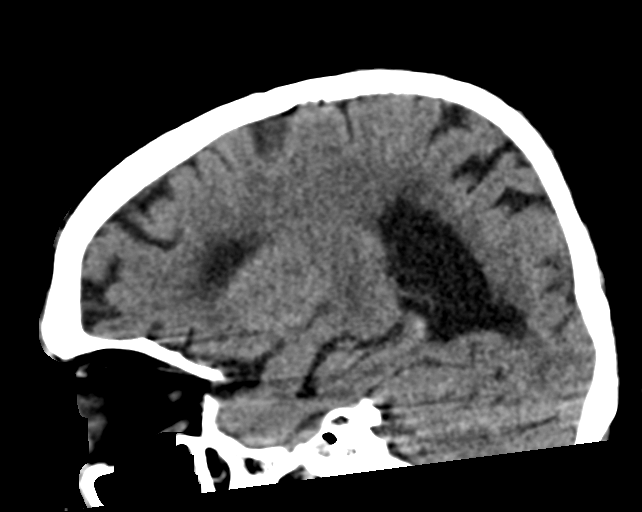
[im 37/55  brain]
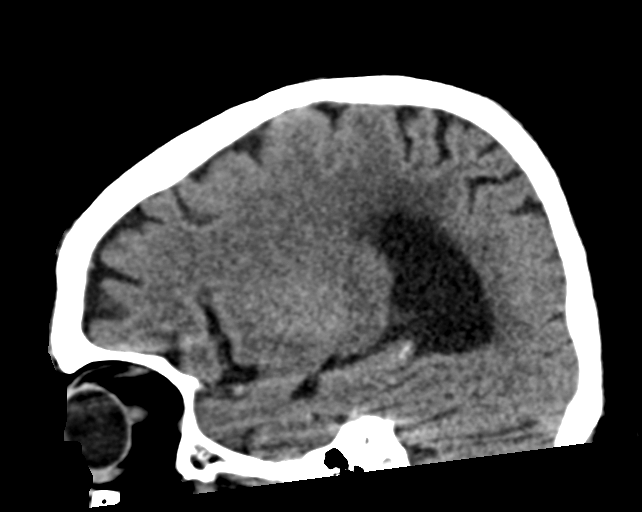

[16 of 47 positions shown; findings below may reference images not displayed]

FINDINGS: Motion limited study.

Brain: Ventriculomegaly which is out of proportion to the degree of
volume loss, concerning for hydrocephalus. Mild periventricular
hypoattenuation. Approximately 5 mm hyperdense lesion at the
foramina Monroe (series 5, image 12), compatible with colloid cyst.
No evidence of acute large vascular territory infarct, acute
hemorrhage, or extra-axial fluid collection.

Vascular: No hyperdense vessel identified. Calcific intracranial
atherosclerosis.

Skull: No acute fracture

Sinuses/Orbits: Right maxillary sinus mucosal thickening, partially
imaged. Unremarkable orbits.

Other: No mastoid effusions.
IMPRESSION: 1. Approximately 5 mm hyperdense lesion at the foramina Monroe,
compatible with colloid cyst.
2. Ventriculomegaly that is out of proportion to the degree of
sulcal volume loss, concerning for obstructive hydrocephalus given
the above findings. Mild periventricular hypoattenuation may
represent transependymal flow of CSF. Recommend neurosurgical
consultation.

Findings and recommendations discussed with Dr. MOHAMAD AMINE via telephone at

ADDENDUM:
Correction: "Foramina monroe" in the report should be foramen of
MOHAMAD AMINE. This was a dictation error.

*** End of Addendum ***
FINDINGS: Motion limited study.

Brain: Ventriculomegaly which is out of proportion to the degree of
volume loss, concerning for hydrocephalus. Mild periventricular
hypoattenuation. Approximately 5 mm hyperdense lesion at the
foramina [place] (series 5, image 12), compatible with colloid cyst.
No evidence of acute large vascular territory infarct, acute
hemorrhage, or extra-axial fluid collection.

Vascular: No hyperdense vessel identified. Calcific intracranial
atherosclerosis.

Skull: No acute fracture

Sinuses/Orbits: Right maxillary sinus mucosal thickening, partially
imaged. Unremarkable orbits.

Other: No mastoid effusions.
IMPRESSION: 1. Approximately 5 mm hyperdense lesion at the foramina [place],
compatible with colloid cyst.
2. Ventriculomegaly that is out of proportion to the degree of
sulcal volume loss, concerning for obstructive hydrocephalus given
the above findings. Mild periventricular hypoattenuation may
represent transependymal flow of CSF. Recommend neurosurgical
consultation.

Findings and recommendations discussed with Dr. MOHAMAD AMINE via telephone at

## 2021-04-22 MED ORDER — ORAL CARE MOUTH RINSE
15.0000 mL | Freq: Two times a day (BID) | OROMUCOSAL | Status: DC
  Administered 2021-04-22: 15 mL via OROMUCOSAL

## 2021-04-22 MED ORDER — CHLORHEXIDINE GLUCONATE 0.12 % MT SOLN
15.0000 mL | Freq: Two times a day (BID) | OROMUCOSAL | Status: DC
  Administered 2021-04-22: 15 mL via OROMUCOSAL
  Filled 2021-04-22: qty 15

## 2021-04-22 MED ORDER — LIP MEDEX EX OINT
TOPICAL_OINTMENT | CUTANEOUS | Status: DC | PRN
  Administered 2021-04-22: 1 via TOPICAL

## 2021-04-22 MED ORDER — LORAZEPAM 2 MG/ML IJ SOLN
1.0000 mg | Freq: Once | INTRAMUSCULAR | Status: AC
  Administered 2021-04-22: 1 mg via INTRAVENOUS
  Filled 2021-04-22: qty 1

## 2021-04-22 MED ORDER — ORAL CARE MOUTH RINSE
15.0000 mL | Freq: Two times a day (BID) | OROMUCOSAL | Status: DC
  Administered 2021-04-22 – 2021-04-25 (×4): 15 mL via OROMUCOSAL

## 2021-04-22 MED ORDER — TAMSULOSIN HCL 0.4 MG PO CAPS
0.4000 mg | ORAL_CAPSULE | Freq: Every day | ORAL | Status: DC
  Administered 2021-04-22 – 2021-04-25 (×4): 0.4 mg via ORAL
  Filled 2021-04-22 (×4): qty 1

## 2021-04-22 NOTE — Assessment & Plan Note (Addendum)
-   Presented with profound DKA, patient was placed on IV fluid hydration

## 2021-04-22 NOTE — Progress Notes (Addendum)
Inpatient Diabetes Program Recommendations  AACE/ADA: New Consensus Statement on Inpatient Glycemic Control (2015)  Target Ranges:  Prepandial:   less than 140 mg/dL      Peak postprandial:   less than 180 mg/dL (1-2 hours)      Critically ill patients:  140 - 180 mg/dL    Latest Reference Range & Units 04/21/21 21:47  Glucose 70 - 99 mg/dL 540 (H)  (H): Data is abnormally high   Latest Reference Range & Units 12/23/20 11:40 04/22/21 00:50  Hemoglobin A1C 4.8 - 5.6 % >15.5 (H) 10.4 (H)  (251 mg/dl)    Latest Reference Range & Units 04/21/21 23:20 04/22/21 00:25 04/22/21 01:38 04/22/21 02:23 04/22/21 03:34 04/22/21 04:41 04/22/21 05:35  Glucose-Capillary 70 - 99 mg/dL 086 (H) 761 (H) 950 (H) 120 (H) 155 (H) 183 (H) 204 (H)  (H): Data is abnormally high    Admit with: DKA (admits to non being adherent to insulin at home) Per MD notes: "Reports that this is in the context of acknowledgment of suboptimal compliance with his home insulin regimen which consists of insulin glargine 50 units subcu daily in the absence of any short acting insulin.  Specifically, he acknowledges missing multiple doses of his basal insulin per week on average."  History: Type 1 Diabetes  Home DM Meds: Basaglar 50 units Daily       Humalog 3 units TID       Metformin 1000 mg BID  Current Orders: IV Insulin Drip    Endocrinologist: Dr. Talmage Nap with [facility] Last Seen 03/17/2021   BMET at 00:50 still showed CO2 level at <7  Need current BMET  Please leave on the IV Insulin Drip with Endotool until Anion Gap 12 or less and CO2 level 20 or higher  When pt finally reaches criteria to transition to SQ Insulin, recommend weight-based basal insulin + Novolog SSI to start Semglee 22 units Daily (0.4 units/kg) Novolog Sensitive Correction Scale/ SSI (0-9 units) TID AC + HS     11am--Attempted to speak with pt this AM.  Pt was very drowsy and could not keep his eyes open to have  conversation with me.  Will try to follow up later this afternoon. 11:25am--Have placed call to pt's ENDO (Dr. Talmage Nap) to try to assess the exact doses of insulin pt is supposed to be taking at home.  Waiting for call back. 12:45pm--ENDO office called me back.  Per ENDO office, pt last seen Dec 2022.  Was told to Stop the Metformin, Start Humalog 3 units TID with meals, and increase Basaglar to 55 units Daily.      --Will follow patient during hospitalization--  Ambrose Finland RN, MSN, CDE Diabetes Coordinator Inpatient Glycemic Control Team Team Pager: [phone] (8a-5p)

## 2021-04-22 NOTE — Progress Notes (Signed)
[name]                                                                               Charles [name], is a 61 y.o. male, DOB - [birth year], ERD:[id number] Admit date - 04/21/2021    Outpatient Primary MD for the patient is [name], [name], MD  LOS - 0  days    Brief summary   Patient is a 61 year old male with poorly controlled diabetes mellitus, type I presented to ED with profound DKA, nausea vomiting and dehydration.  Patient was placed on IV insulin drip, IV fluid hydration and admitted for further work-up.   Assessment & Plan    Assessment and Plan: * DKA (diabetic ketoacidosis) (HCC)- (present on admission) - Poorly controlled diabetes, hemoglobin A1c 10, presented with hyperglycemia, nausea and vomiting -CBG 492 at the time of admission, bicarb less than 7, sodium 131.  BHB > 8. -Placed on aggressive IV fluid hydration, IV insulin drip -Consult placed to diabetic coordinator, nutrition consult for counseling -Lives alone, unclear if patient is able to manage his care, insulin at home.  Currently confused.  Dehydration- (present on admission) - Presented with profound DKA, continue IV fluid hydration  Nausea & vomiting- (present on admission) - Likely due to profound DKA, will continue IV fluid hydration, IV insulin drip -No ongoing active vomiting.  Type 2 diabetes mellitus with complication, with long-term current use of insulin (HCC) - Unclear if patient is taking insulin as recommended by outpatient endocrinology.  On metformin 1000 mg BID, Humalog 3 units 3 times daily, Basaglar 50 units daily -Continue IV insulin drip, IV fluids for now -Hemoglobin A1c 10.4  Acute metabolic encephalopathy- (present on admission) - On exam, patient noted to be oriented to self only, not completely following commands.   -Obtain UA and culture, urine drug screen, B12, B1, ammonia level.  TSH 2.1 on 2/1 -CT head obtained which showed ventriculomegaly and colloid  cyst with concern for obstructive hydrocephalus.  I discussed with neurosurgery, Dr. Lovell Sheehan who do not feel this is obstructive hydrocephalus, has marked brain atrophy and recommended MRI of the brain for further work-up.   Acute prerenal azotemia- (present on admission) - Creatinine 1.16 with dehydration, profound DKA with nausea and vomiting -Continue IV fluid hydration, creatinine improving, 0.8  HLD (hyperlipidemia)- (present on admission) - Will resume Crestor once tolerating oral diet  Leukocytosis- (present on admission) - Likely due to stress demargination, WBCs improving -HIV negative, flu, COVID-19 negative, chest x-ray negative for pneumonia -Follow UA and culture  Code Status: Full CODE STATUS DVT Prophylaxis:  SCDs Start: 04/21/21 2247   Level of Care: Level of care: Stepdown Family Communication: No family member at the bedside  Disposition Plan:     Remains inpatient appropriate: Profound DKA, on IV insulin drip, IV fluid hydration.  Procedures:  CT head  Consultants:   Neurosurgery  Antimicrobials:  none  Medications  Scheduled Meds:  chlorhexidine  15 mL Mouth Rinse BID   Chlorhexidine Gluconate Cloth  6 each Topical Daily   mouth rinse  15 mL Mouth Rinse q12n4p   Continuous Infusions:  dextrose 5 % and 0.45 % NaCl with KCl 10  mEq/L 125 mL/hr at 04/22/21 0949   insulin 3.6 Units/hr (04/22/21 0800)   sodium chloride 0.45 % with kcl Stopped (04/22/21 0145)   PRN Meds:.acetaminophen **OR** acetaminophen, dextrose, lip balm, ondansetron (ZOFRAN) IV    Subjective:   Charles [name] was seen and examined today.  Confused,  oriented to self.  CBG still elevated.  Denies any pain.  No fevers.  Currently no ongoing nausea vomiting or abdominal pain, diarrhea.  Objective:   Vitals:   04/22/21 0600 04/22/21 0630 04/22/21 0740 04/22/21 0800  BP: (!) 117/56   (!) 109/49  Pulse: 98   100  Resp: (!) 24   (!) 31  Temp:  97.9 F (36.6 C) 97.9 F (36.6 C)    TempSrc:  Axillary Oral   SpO2: 100%   100%  Weight:      Height:        Intake/Output Summary (Last 24 hours) at 04/22/2021 1157 Last data filed at 04/22/2021 0800 Gross per 24 hour  Intake 1267.84 ml  Output 700 ml  Net 567.84 ml   Filed Weights   04/22/21 0057 04/22/21 0407  Weight: 55.5 kg 55.5 kg     Exam General: Alert and oriented x self, NAD Cardiovascular: S1 S2 auscultated, RRR Respiratory: CTA B Gastrointestinal: Soft, nontender, nondistended, + bowel sounds Ext: no pedal edema bilaterally Neuro: spontaneously moving all 4 extremities, does not fully comprehend and follow commands Skin: No rashes   Data Reviewed:  I have personally reviewed following labs and imaging studies   CBC Lab Results  Component Value Date   WBC 17.1 (H) 04/22/2021   RBC 5.10 04/22/2021   HGB 16.2 04/22/2021   HCT 45.1 04/22/2021   MCV 88.4 04/22/2021   MCH 31.8 04/22/2021   PLT 273 04/22/2021   MCHC 35.9 04/22/2021   RDW 12.1 04/22/2021   LYMPHSABS 1.0 04/22/2021   MONOABS 0.6 04/22/2021   EOSABS 0.0 04/22/2021   BASOSABS 0.0 04/22/2021     Last metabolic panel Lab Results  Component Value Date   NA 135 04/22/2021   K 3.7 04/22/2021   CL 107 04/22/2021   CO2 10 (L) 04/22/2021   BUN 43 (H) 04/22/2021   CREATININE 1.29 (H) 04/22/2021   GLUCOSE 175 (H) 04/22/2021   GFRNONAA >60 04/22/2021   CALCIUM 9.3 04/22/2021   PHOS 3.7 04/22/2021   PROT 6.9 04/22/2021   ALBUMIN 3.9 04/22/2021   BILITOT 0.9 04/22/2021   ALKPHOS 98 04/22/2021   AST 22 04/22/2021   ALT 25 04/22/2021   ANIONGAP 18 (H) 04/22/2021    CBG (last 3)  Recent Labs    04/22/21 0939 04/22/21 1048 04/22/21 1144  GLUCAP 183* 172* 186*      Coagulation Profile: No results for input(s): INR, PROTIME in the last 168 hours.   Radiology Studies: CT HEAD WO CONTRAST ( )  Result Date: 04/22/2021 CLINICAL DATA:  Mental status change, unknown cause EXAM: CT HEAD WITHOUT CONTRAST TECHNIQUE:  Contiguous axial images were obtained from the base of the skull through the vertex without intravenous contrast. RADIATION DOSE REDUCTION: This exam was performed according to the departmental dose-optimization program which includes automated exposure control, adjustment of the mA and/or kV according to patient size and/or use of iterative reconstruction technique. COMPARISON:  None. FINDINGS: Motion limited study. Brain: Ventriculomegaly which is out of proportion to the degree of volume loss, concerning for hydrocephalus. Mild periventricular hypoattenuation. Approximately 5 mm hyperdense lesion at the foramina [place] (series 5, image 12), compatible with  colloid cyst. No evidence of acute large vascular territory infarct, acute hemorrhage, or extra-axial fluid collection. Vascular: No hyperdense vessel identified. Calcific intracranial atherosclerosis. Skull: No acute fracture Sinuses/Orbits: Right maxillary sinus mucosal thickening, partially imaged. Unremarkable orbits. Other: No mastoid effusions. IMPRESSION: 1. Approximately 5 mm hyperdense lesion at the foramina [place], compatible with colloid cyst. 2. Ventriculomegaly that is out of proportion to the degree of sulcal volume loss, concerning for obstructive hydrocephalus given the above findings. Mild periventricular hypoattenuation may represent transependymal flow of CSF. Recommend neurosurgical consultation. Findings and recommendations discussed with Dr. Isidoro Donning via telephone at 11:05 a.m. Electronically Signed   By: Feliberto Harts M.D.   On: 04/22/2021 11:09   DG Chest Port 1 View  Result Date: 04/22/2021 CLINICAL DATA:  61 year old male with history of diabetic ketoacidosis. EXAM: PORTABLE CHEST 1 VIEW COMPARISON:  No priors. FINDINGS: Lung volumes are low. No consolidative airspace disease. No pleural effusions. No pneumothorax. No pulmonary nodule or mass noted. Pulmonary vasculature and the cardiomediastinal silhouette are within normal limits.  Status post median sternotomy for CABG. IMPRESSION: 1. Low lung volumes without radiographic evidence of acute cardiopulmonary disease. Electronically Signed   By: Trudie Reed M.D.   On: 04/22/2021 05:02       Orah Sonnen M.D. [name] 04/22/2021, 11:57 AM  Available via Epic secure chat 7am-7pm After 7 pm, please refer to night coverage provider listed on amion.

## 2021-04-22 NOTE — ED Notes (Signed)
ED TO INPATIENT HANDOFF REPORT  ED Nurse Name and Phone #: Fabio Neighbors RN  S Name/Age/Gender Charles [name] 61 y.o. male Room/Bed: WA11/WA11  Code Status   Code Status: Full Code  Home/SNF/Other Home Patient oriented to: self, place, time, and situation Is this baseline? Yes   Triage Complete: Triage complete  Chief Complaint DKA (diabetic ketoacidosis) ([place]) [E11.10]  Triage Note Pt coming form home with c/o hyperglycemia. Pt's CBG was 488 with EMS before he received 500 ml NS bolus. Pt has hx of T1 DM. Pt states that they have been hyperglycemic for 2 days and have not taken any insulin for 2 days. He states he has insulin just has not wanted to take it and has not been eating. Pt c/o lack of appetite for x 2-3. Pt c/o weakness, dizzines, thirst, polyuria, and is tachypnic.    Allergies No Known Allergies  Level of Care/Admitting Diagnosis ED Disposition     ED Disposition  Admit   Condition  --   Comment  Hospital Area: [facility] [[id number]]  Level of Care: Stepdown [14]  Admit to SDU based on following criteria: Severe physiological/psychological symptoms:  Any diagnosis requiring assessment & intervention at least every 4 hours on an ongoing basis to obtain desired patient outcomes including stability and rehabilitation  May place patient in observation at [facility] or [place] if equivalent level of care is available:: No  Covid Evaluation: Asymptomatic Screening Protocol (No Symptoms)  Diagnosis: DKA (diabetic ketoacidosis) [facility]) [id number]  Admitting Physician: Rhetta Mura [id number]  Attending Physician: Rhetta Mura [id number]          B Medical/Surgery History Past Medical History:  Diagnosis Date   Arthritis    Diabetes mellitus without complication ([place])    Heart disease    Hypertension    Past Surgical History:  Procedure Laterality Date   CORONARY ARTERY BYPASS GRAFT       A IV  Location/Drains/Wounds Patient Lines/Drains/Airways Status     Active Line/Drains/Airways     Name Placement date Placement time Site Days   Peripheral IV 04/21/21 18 G Right Antecubital 04/21/21  --  Antecubital  1   Peripheral IV 04/21/21 20 G Posterior;Right Forearm 04/21/21  1939  Forearm  1            Intake/Output Last 24 hours  Intake/Output Summary (Last 24 hours) at 04/22/2021 0000 Last data filed at 04/21/2021 2342 Gross per 24 hour  Intake 195.11 ml  Output --  Net 195.11 ml    Labs/Imaging Results for orders placed or performed during the hospital encounter of 04/21/21 (from the past 48 hour(s))  CBG monitoring, ED     Status: Abnormal   Collection Time: 04/21/21  7:26 PM  Result Value Ref Range   Glucose-Capillary 488 (H) 70 - 99 mg/dL    Comment: Glucose reference range applies only to samples taken after fasting for at least 8 hours.  CBC     Status: Abnormal   Collection Time: 04/21/21  7:35 PM  Result Value Ref Range   WBC 19.2 (H) 4.0 - 10.5 K/uL   RBC 5.23 4.22 - 5.81 MIL/uL   Hemoglobin 16.3 13.0 - 17.0 g/dL   HCT 47.3 39.0 - 52.0 %   MCV 90.4 80.0 - 100.0 fL   MCH 31.2 26.0 - 34.0 pg   MCHC 34.5 30.0 - 36.0 g/dL   RDW 12.4 11.5 - 15.5 %   Platelets 294 150 - 400  K/uL   nRBC 0.0 0.0 - 0.2 %    Comment: Performed at [facility], [place] [address] [place], [place] [id number]  Basic metabolic panel     Status: Abnormal   Collection Time: 04/21/21  9:47 PM  Result Value Ref Range   Sodium 131 (L) 135 - 145 mmol/L   Potassium 4.6 3.5 - 5.1 mmol/L   Chloride 103 98 - 111 mmol/L   CO2 <7 (L) 22 - 32 mmol/L   Glucose, Bld 492 (H) 70 - 99 mg/dL    Comment: Glucose reference range applies only to samples taken after fasting for at least 8 hours.   BUN 46 (H) 6 - 20 mg/dL   Creatinine, Ser 1.16 0.61 - 1.24 mg/dL   Calcium 9.1 8.9 - 10.3 mg/dL   GFR, Estimated >60 >60 mL/min    Comment: (NOTE) Calculated using the CKD-EPI Creatinine  Equation (2021) Performed at [facility], [place] [address], [place], [place] [address]   Beta-hydroxybutyric acid     Status: Abnormal   Collection Time: 04/21/21  9:47 PM  Result Value Ref Range   Beta-Hydroxybutyric Acid >8.00 (H) 0.05 - 0.27 mmol/L    Comment: RESULTS CONFIRMED BY MANUAL DILUTION Performed at [facility] [address] [place], [place] [address]   Magnesium     Status: None   Collection Time: 04/21/21  9:47 PM  Result Value Ref Range   Magnesium 2.2 1.7 - 2.4 mg/dL    Comment: Performed at [facility], [place] [address], [place], [place] [address]  I-stat chem 8, ED (not at [facility] or [facility])     Status: Abnormal   Collection Time: 04/21/21  9:49 PM  Result Value Ref Range   Sodium 132 (L) 135 - 145 mmol/L   Potassium 4.6 3.5 - 5.1 mmol/L   Chloride 108 98 - 111 mmol/L   BUN 38 (H) 6 - 20 mg/dL   Creatinine, Ser 0.80 0.61 - 1.24 mg/dL   Glucose, Bld 455 (H) 70 - 99 mg/dL    Comment: Glucose reference range applies only to samples taken after fasting for at least 8 hours.   Calcium, Ion 1.34 1.15 - 1.40 mmol/L   TCO2 7 (L) 22 - 32 mmol/L   Hemoglobin 15.3 13.0 - 17.0 g/dL   HCT 45.0 39.0 - 52.0 %  CBG monitoring, ED     Status: Abnormal   Collection Time: 04/21/21 10:03 PM  Result Value Ref Range   Glucose-Capillary 437 (H) 70 - 99 mg/dL    Comment: Glucose reference range applies only to samples taken after fasting for at least 8 hours.   Comment 1 Notify RN    Comment 2 Document in Chart   CBG monitoring, ED     Status: Abnormal   Collection Time: 04/21/21 10:38 PM  Result Value Ref Range   Glucose-Capillary 428 (H) 70 - 99 mg/dL    Comment: Glucose reference range applies only to samples taken after fasting for at least 8 hours.   Comment 1 Notify RN    Comment 2 Document in Chart   Resp Panel by RT-PCR (Flu A&B, Covid) Nasopharyngeal Swab     Status: None   Collection Time: 04/21/21 10:39 PM    Specimen: Nasopharyngeal Swab; Nasopharyngeal(NP) swabs in vial transport medium  Result Value Ref Range   SARS Coronavirus 2 by RT PCR NEGATIVE NEGATIVE    Comment: (NOTE) SARS-CoV-2 target nucleic acids are NOT DETECTED.  The SARS-CoV-2 RNA is generally detectable in upper respiratory specimens during the acute phase of infection. The lowest concentration of SARS-CoV-2 viral copies this assay can detect is 138 copies/mL. A negative result does not preclude SARS-Cov-2 infection and should not be used as the sole basis for treatment or other patient management decisions. A negative result may occur with  improper specimen collection/handling, submission of specimen other than nasopharyngeal swab, presence of viral mutation(s) within the areas targeted by this assay, and inadequate number of viral copies(<138 copies/mL). A negative result must be combined with clinical observations, patient history, and epidemiological information. The expected result is Negative.  Fact Sheet for Patients:  EntrepreneurPulse.com.au  Fact Sheet for Healthcare Providers:  IncredibleEmployment.be  This test is no t yet approved or cleared by the [place] FDA and  has been authorized for detection and/or diagnosis of SARS-CoV-2 by FDA under an Emergency Use Authorization (EUA). This EUA will remain  in effect (meaning this test can be used) for the duration of the COVID-19 declaration under Section 564(b)(1) of the Act, 21 U.S.C.section 360bbb-3(b)(1), unless the authorization is terminated  or revoked sooner.       Influenza A by PCR NEGATIVE NEGATIVE   Influenza B by PCR NEGATIVE NEGATIVE    Comment: (NOTE) The Xpert Xpress SARS-CoV-2/FLU/RSV plus assay is intended as an aid in the diagnosis of influenza from Nasopharyngeal swab specimens and should not be used as a sole basis for treatment. Nasal washings and aspirates are unacceptable for Xpert Xpress  SARS-CoV-2/FLU/RSV testing.  Fact Sheet for Patients: EntrepreneurPulse.com.au  Fact Sheet for Healthcare Providers: IncredibleEmployment.be  This test is not yet approved or cleared by the [place] FDA and has been authorized for detection and/or diagnosis of SARS-CoV-2 by FDA under an Emergency Use Authorization (EUA). This EUA will remain in effect (meaning this test can be used) for the duration of the COVID-19 declaration under Section 564(b)(1) of the Act, 21 U.S.C. section 360bbb-3(b)(1), unless the authorization is terminated or revoked.  Performed at [facility], [place] [address] [place], [place] [address]   CBG monitoring, ED     Status: Abnormal   Collection Time: 04/21/21 11:20 PM  Result Value Ref Range   Glucose-Capillary 369 (H) 70 - 99 mg/dL    Comment: Glucose reference range applies only to samples taken after fasting for at least 8 hours.   No results found.  Pending Labs Unresulted Labs (From admission, onward)     Start     Ordered   04/22/21 [id number]  Basic metabolic panel  Once-Timed,   TIMED        04/21/21 2250   04/22/21 0500  HIV Antibody (routine testing w rflx)  (HIV Antibody (Routine testing w reflex) panel)  Tomorrow morning,   R        04/21/21 2246   04/22/21 0500  Phosphorus  Tomorrow morning,   R       Question:  Specimen collection method  Answer:  Lab=Lab collect   04/21/21 2248   04/22/21 0500  Magnesium  Tomorrow morning,   R       Question:  Specimen collection method  Answer:  Lab=Lab collect   04/21/21 2248   04/22/21 0500  Comprehensive metabolic panel  Tomorrow morning,   R       Question:  Specimen collection method  Answer:  Lab=Lab collect   04/21/21 2248   04/22/21 0500  CBC with Differential/Platelet  Tomorrow morning,   R  Question:  Specimen collection method  Answer:  Lab=Lab collect   04/21/21 2248   04/22/21 [id number]  Basic metabolic panel  Once-Timed,   TIMED         04/21/21 2250   04/21/21 2306  MRSA Next Gen by PCR, Nasal  Once,   R        04/21/21 2305   04/21/21 1903  Urinalysis, Routine w reflex microscopic  Once,   STAT        04/21/21 1903            Vitals/Pain Today's Vitals   04/21/21 2057 04/21/21 2200 04/21/21 2300 04/21/21 2330  BP:  134/69 118/62 111/67  Pulse:  89 100 (!) 31  Resp:  (!) 25 17 (!) 26  Temp: (!) 92.1 F (33.4 C)   (!) 94.3 F (34.6 C)  TempSrc: Rectal   Rectal  SpO2:  100% 100% (!) 20%  Height:      PainSc:        Isolation Precautions No active isolations  Medications Medications  insulin regular, human (MYXREDLIN) 100 units/ 100 mL infusion (11.5 Units/hr Intravenous New Bag/Given 04/21/21 2208)  dextrose 50 % solution 0-50 mL (has no administration in time range)  sodium chloride 0.45 % 1,000 mL with potassium chloride 10 mEq infusion ( Intravenous New Bag/Given 04/21/21 2341)  dextrose 5 % and 0.45 % NaCl with KCl 10 mEq/L infusion (has no administration in time range)  acetaminophen (TYLENOL) tablet 650 mg (has no administration in time range)    Or  acetaminophen (TYLENOL) suppository 650 mg (has no administration in time range)  ondansetron (ZOFRAN) injection 4 mg (has no administration in time range)  Chlorhexidine Gluconate Cloth 2 % PADS 6 each (has no administration in time range)    Mobility walks High fall risk   Focused Assessments    R Recommendations: See Admitting Provider Note  Report given to:   Additional Notes:

## 2021-04-22 NOTE — Discharge Instructions (Signed)

## 2021-04-22 NOTE — Progress Notes (Signed)
NUTRITION NOTE  RD consulted for nutrition education regarding diabetes.   Lab Results  Component Value Date   HGBA1C 10.4 (H) 04/22/2021    RD provided "Carbohydrate Counting for People with Diabetes" AND "Label Reading Tips for Diabetes" handouts from the Academy of Nutrition and Dietetics.   Patient sleeping soundly and does not awake to name call. No visitors present at the time of RD visit. Left handouts on bedside table and will also place copies in Discharge Instructions/AVS.   Recommend referral for outpatient RD for ongoing DM diet education (cleared by MD).    Expect fair compliance based on current HgbA1c and notes indicating patient reported non-compliance with medications PTA.   Body mass index is 19.75 kg/m. Pt meets criteria for normal weight based on current BMI. Skin WDL.  He has been NPO since admission.   Labs reviewed; CBGs: 155-243 mg/dl Medications reviewed; insulin drip.  No further nutrition interventions warranted at this time. RD contact information provided. If additional nutrition issues arise, please re-consult RD.      Trenton Gammon, MS, RD, LDN Inpatient Clinical Dietitian RD pager # available in AMION  After hours/weekend pager # available in [facility]

## 2021-04-22 NOTE — Assessment & Plan Note (Addendum)
-   Improving, UA negative for UTI.  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  I discussed with neurosurgery, Dr. Lovell Sheehan who do not feel this is obstructive hydrocephalus, has marked brain atrophy, no neurosurgery intervention needed. 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 -Currently at baseline, alert and oriented x3

## 2021-04-22 NOTE — Assessment & Plan Note (Addendum)
-   Likely due to stress demargination -HIV negative, flu, COVID-19 negative, chest x-ray negative for pneumonia UA negative for UTI -Leukocytosis resolved

## 2021-04-22 NOTE — Assessment & Plan Note (Addendum)
-   Unclear if patient is taking insulin as recommended by outpatient endocrinology.  On metformin 1000 mg BID, Humalog 3 units 3 times daily, Basaglar 50 units daily (not consistently taking) -Hemoglobin A1c 10.4 -Patient declines skilled nursing facility.  Discussed with diabetic coordinator.  Will continue Basaglar 28 units daily and Humalog 3 units 3 times daily.  Outpatient follow-up with his endocrinologist, Dr Talmage Nap

## 2021-04-22 NOTE — Assessment & Plan Note (Addendum)
-   Resume Crestor 

## 2021-04-22 NOTE — Progress Notes (Signed)
Received call from Sherryl Manges, pt gave permission to speak with his friend, updated on pt current status. Ms. Barbaraann Faster stated the pt did not have a job, but should have insurance for insulin coverage. Ms. Barbaraann Faster stated the pt was tired of using insulin/having to check sugar levels.

## 2021-04-22 NOTE — Hospital Course (Addendum)
Patient is a 61 year old male with poorly controlled diabetes mellitus, type I presented to ED with profound DKA, nausea vomiting and dehydration.  Patient was placed on IV insulin drip, IV fluid hydration and admitted for further work-up. Patient has been transitioned to subcu insulin.   PT recommended SNF

## 2021-04-22 NOTE — Assessment & Plan Note (Addendum)
-   Creatinine 1.16 with dehydration, profound DKA with nausea and vomiting -Received IV fluid hydration, creatinine now normal 0.6

## 2021-04-22 NOTE — Assessment & Plan Note (Addendum)
-   Likely due to profound DKA -Resolved, tolerating diet

## 2021-04-22 NOTE — Assessment & Plan Note (Addendum)
-   Poorly controlled diabetes, hemoglobin A1c 10, presented with hyperglycemia, nausea and vomiting -CBG 492 at the time of admission, bicarb less than 7, sodium 131.  BHB > 8. -Lives alone, unclear if patient is able to manage his care and insulin at home.  Patient declines skilled nursing facility, states he will be able to manage his insulin.  Home health RN was arranged. -Currently transitioned to subcu insulin.

## 2021-04-23 DIAGNOSIS — R338 Other retention of urine: Secondary | ICD-10-CM

## 2021-04-23 DIAGNOSIS — R339 Retention of urine, unspecified: Secondary | ICD-10-CM | POA: Diagnosis present

## 2021-04-23 LAB — CBC
HCT: 32 % — ABNORMAL LOW (ref 39.0–52.0)
Hemoglobin: 12 g/dL — ABNORMAL LOW (ref 13.0–17.0)
MCH: 31.5 pg (ref 26.0–34.0)
MCHC: 37.5 g/dL — ABNORMAL HIGH (ref 30.0–36.0)
MCV: 84 fL (ref 80.0–100.0)
Platelets: 143 10*3/uL — ABNORMAL LOW (ref 150–400)
RBC: 3.81 MIL/uL — ABNORMAL LOW (ref 4.22–5.81)
RDW: 12.2 % (ref 11.5–15.5)
WBC: 8.9 10*3/uL (ref 4.0–10.5)
nRBC: 0 % (ref 0.0–0.2)

## 2021-04-23 LAB — URINE CULTURE: Culture: NO GROWTH

## 2021-04-23 LAB — GLUCOSE, CAPILLARY
Glucose-Capillary: 160 mg/dL — ABNORMAL HIGH (ref 70–99)
Glucose-Capillary: 162 mg/dL — ABNORMAL HIGH (ref 70–99)
Glucose-Capillary: 162 mg/dL — ABNORMAL HIGH (ref 70–99)
Glucose-Capillary: 170 mg/dL — ABNORMAL HIGH (ref 70–99)
Glucose-Capillary: 178 mg/dL — ABNORMAL HIGH (ref 70–99)
Glucose-Capillary: 182 mg/dL — ABNORMAL HIGH (ref 70–99)
Glucose-Capillary: 183 mg/dL — ABNORMAL HIGH (ref 70–99)
Glucose-Capillary: 206 mg/dL — ABNORMAL HIGH (ref 70–99)
Glucose-Capillary: 216 mg/dL — ABNORMAL HIGH (ref 70–99)
Glucose-Capillary: 218 mg/dL — ABNORMAL HIGH (ref 70–99)

## 2021-04-23 LAB — BASIC METABOLIC PANEL
Anion gap: 5 (ref 5–15)
BUN: 25 mg/dL — ABNORMAL HIGH (ref 6–20)
CO2: 18 mmol/L — ABNORMAL LOW (ref 22–32)
Calcium: 8.3 mg/dL — ABNORMAL LOW (ref 8.9–10.3)
Chloride: 113 mmol/L — ABNORMAL HIGH (ref 98–111)
Creatinine, Ser: 0.58 mg/dL — ABNORMAL LOW (ref 0.61–1.24)
GFR, Estimated: >60 mL/min (ref >60)
Glucose, Bld: 181 mg/dL — ABNORMAL HIGH (ref 70–99)
Potassium: 3.3 mmol/L — ABNORMAL LOW (ref 3.5–5.1)
Sodium: 136 mmol/L (ref 135–145)

## 2021-04-23 MED ORDER — INSULIN GLARGINE-YFGN 100 UNIT/ML ~~LOC~~ SOLN: 25.0000 [IU] | Freq: Every day | SUBCUTANEOUS | Status: DC

## 2021-04-23 MED ORDER — ALUM & MAG HYDROXIDE-SIMETH 200-200-20 MG/5ML PO SUSP
30.0000 mL | ORAL | Status: DC | PRN
  Administered 2021-04-23 – 2021-04-24 (×2): 30 mL via ORAL
  Filled 2021-04-23 (×2): qty 30

## 2021-04-23 MED ORDER — INSULIN GLARGINE-YFGN 100 UNIT/ML ~~LOC~~ SOLN
10.0000 [IU] | Freq: Once | SUBCUTANEOUS | Status: AC
  Administered 2021-04-23: 10 [IU] via SUBCUTANEOUS
  Filled 2021-04-23: qty 0.1

## 2021-04-23 MED ORDER — POTASSIUM CHLORIDE CRYS ER 20 MEQ PO TBCR
20.0000 meq | EXTENDED_RELEASE_TABLET | Freq: Once | ORAL | Status: AC
  Administered 2021-04-23: 20 meq via ORAL
  Filled 2021-04-23: qty 1

## 2021-04-23 MED ORDER — CLOTRIMAZOLE-BETAMETHASONE 1-0.05 % EX CREA
TOPICAL_CREAM | Freq: Two times a day (BID) | CUTANEOUS | Status: DC
  Administered 2021-04-23: 1 via TOPICAL
  Filled 2021-04-23: qty 15

## 2021-04-23 MED ORDER — OXYBUTYNIN CHLORIDE 5 MG PO TABS
5.0000 mg | ORAL_TABLET | Freq: Three times a day (TID) | ORAL | Status: DC | PRN
  Administered 2021-04-23: 5 mg via ORAL
  Filled 2021-04-23: qty 1

## 2021-04-23 MED ORDER — INSULIN GLARGINE-YFGN 100 UNIT/ML ~~LOC~~ SOLN
10.0000 [IU] | Freq: Every day | SUBCUTANEOUS | Status: DC
  Administered 2021-04-23: 10 [IU] via SUBCUTANEOUS
  Filled 2021-04-23: qty 0.1

## 2021-04-23 MED ORDER — INSULIN ASPART 100 UNIT/ML IJ SOLN
0.0000 [IU] | Freq: Three times a day (TID) | INTRAMUSCULAR | Status: DC
  Administered 2021-04-23 (×2): 5 [IU] via SUBCUTANEOUS
  Administered 2021-04-23 – 2021-04-24 (×2): 3 [IU] via SUBCUTANEOUS
  Administered 2021-04-24: 5 [IU] via SUBCUTANEOUS
  Administered 2021-04-25: 11 [IU] via SUBCUTANEOUS
  Administered 2021-04-25 (×2): 3 [IU] via SUBCUTANEOUS

## 2021-04-23 MED ORDER — POTASSIUM CHLORIDE CRYS ER 20 MEQ PO TBCR: 40.0000 meq | EXTENDED_RELEASE_TABLET | Freq: Once | ORAL | Status: DC

## 2021-04-23 MED ORDER — INSULIN GLARGINE-YFGN 100 UNIT/ML ~~LOC~~ SOLN
20.0000 [IU] | Freq: Every day | SUBCUTANEOUS | Status: DC
  Filled 2021-04-23: qty 0.2

## 2021-04-23 MED ORDER — INSULIN ASPART 100 UNIT/ML IJ SOLN
4.0000 [IU] | Freq: Three times a day (TID) | INTRAMUSCULAR | Status: DC
  Administered 2021-04-23: 4 [IU] via SUBCUTANEOUS

## 2021-04-23 MED ORDER — SODIUM CHLORIDE 0.9 % IV SOLN: INTRAVENOUS | Status: DC | PRN

## 2021-04-23 NOTE — Evaluation (Signed)
Physical Therapy Evaluation Patient Details Name: Charles [name] MRN: [id number] DOB: 29-Sep-1960 Today's Date: 04/23/2021  History of Present Illness  61 year old male with poorly controlled diabetes mellitus, type I presented to ED with profound DKA, nausea vomiting and dehydration. PMH includes HTN, CABG.  Clinical Impression  Pt admitted with above diagnosis. Pt ambulated 39' with RW with +2 mod assist. In standing pt has significantly flexed trunk and BLEs, gait is quite unsteady, he is a high fall risk. He stated he has no friends nor family locally that can assist him. At present, he'd need ST-SNF level of care. He would need to make significant gains with mobility to be safe to DC home.  Pt currently with functional limitations due to the deficits listed below (see PT Problem List). Pt will benefit from skilled PT to increase their independence and safety with mobility to allow discharge to the venue listed below.          Recommendations for follow up therapy are one component of a multi-disciplinary discharge planning process, led by the attending physician.  Recommendations may be updated based on patient status, additional functional criteria and insurance authorization.  Follow Up Recommendations Skilled nursing-short term rehab (<3 hours/day)    Assistance Recommended at Discharge Intermittent Supervision/Assistance  Patient can return home with the following  A lot of help with walking and/or transfers;Assistance with cooking/housework;Direct supervision/assist for medications management;Assist for transportation;Help with stairs or ramp for entrance;A little help with bathing/dressing/bathroom    Equipment Recommendations Other (comment) (TBD)  Recommendations for Other Services       Functional Status Assessment Patient has had a recent decline in their functional status and demonstrates the ability to make significant improvements in function in a reasonable and predictable  amount of time.     Precautions / Restrictions Precautions Precautions: Fall Restrictions Weight Bearing Restrictions: No      Mobility  Bed Mobility               General bed mobility comments: up in bathroom with nursing    Transfers                   General transfer comment: standing up at sink in bathroom with nursing at start of session. For stand to sit he required Mod A to control descent    Ambulation/Gait Ambulation/Gait assistance: +2 safety/equipment, +2 physical assistance, Mod assist Gait Distance (Feet): 16 Feet Assistive device: Rolling walker (2 wheels) Gait Pattern/deviations: Step-through pattern, Narrow base of support, Trunk flexed, Decreased dorsiflexion - right, Decreased dorsiflexion - left Gait velocity: decr     General Gait Details: flexed trunk/hips/knees, very narrow BOS, walking on toes, +2 to maintain upright position  Stairs            Wheelchair Mobility    Modified Rankin (Stroke Patients Only)       Balance Overall balance assessment: Needs assistance   Sitting balance-Leahy Scale: Fair     Standing balance support: Bilateral upper extremity supported Standing balance-Leahy Scale: Zero Standing balance comment: heavy reliance upon BUE support and support from +2 assist                             Pertinent Vitals/Pain Pain Assessment Pain Assessment: Faces Pain Score: 4  Pain Location: lower abdomen Pain Descriptors / Indicators: Grimacing, Guarding    Home Living Family/patient expects to be discharged to:: Private residence Living Arrangements: Alone Available Help at  Discharge:  (reports his closest family is in [place])           Home Layout: One level Home Equipment: Rollator (4 wheels) Additional Comments: reports he has no local family/friends who can asisst at home    Prior Function Prior Level of Function : Independent/Modified Independent             Mobility  Comments: pt stated he just got a rollator recently due to lower abdominal pain and weakness       Hand Dominance        Extremity/Trunk Assessment   Upper Extremity Assessment Upper Extremity Assessment: Defer to OT evaluation    Lower Extremity Assessment Lower Extremity Assessment: Generalized weakness;RLE deficits/detail;LLE deficits/detail (B knee ext -4/5) RLE Deficits / Details: knee ext -4/5 RLE Sensation: WNL LLE Deficits / Details: knee ext -4/5 LLE Sensation: WNL    Cervical / Trunk Assessment Cervical / Trunk Assessment: Kyphotic  Communication   Communication: No difficulties  Cognition Arousal/Alertness: Awake/alert Behavior During Therapy: Flat affect Overall Cognitive Status: Impaired/Different from baseline Area of Impairment: Orientation, Safety/judgement                 Orientation Level: Time       Safety/Judgement: Decreased awareness of deficits, Decreased awareness of safety     General Comments: oriented to self, knows he's in a hospital, stated month is March, stated year correctly, increased processing time        General Comments      Exercises     Assessment/Plan    PT Assessment Patient needs continued PT services  PT Problem List Decreased activity tolerance;Decreased balance;Decreased knowledge of use of DME;Pain;Decreased cognition;Decreased mobility;Decreased strength;Decreased safety awareness       PT Treatment Interventions Gait training;Therapeutic exercise;Therapeutic activities;Patient/family education;Functional mobility training;Balance training    PT Goals (Current goals can be found in the Care Plan section)  Acute Rehab PT Goals Patient Stated Goal: decrease pain, walk PT Goal Formulation: With patient Time For Goal Achievement: 05/07/21 Potential to Achieve Goals: Good    Frequency Min 3X/week     Co-evaluation               AM-PAC PT "6 Clicks" Mobility  Outcome Measure Help needed  turning from your back to your side while in a flat bed without using bedrails?: A Lot Help needed moving from lying on your back to sitting on the side of a flat bed without using bedrails?: A Lot Help needed moving to and from a bed to a chair (including a wheelchair)?: A Lot Help needed standing up from a chair using your arms (e.g., wheelchair or bedside chair)?: A Lot Help needed to walk in hospital room?: A Lot Help needed climbing 3-5 steps with a railing? : Total 6 Click Score: 11    End of Session Equipment Utilized During Treatment: Gait belt Activity Tolerance: Patient limited by fatigue Patient left: in chair;with call bell/phone within reach;with chair alarm set Nurse Communication: Mobility status PT Visit Diagnosis: Muscle weakness (generalized) (M62.81);Difficulty in walking, not elsewhere classified (R26.2);Other abnormalities of gait and mobility (R26.89)    Time: 2409-7353 PT Time Calculation (min) (ACUTE ONLY): 13 min   Charges:   PT Evaluation $PT Eval Moderate Complexity: 1 Mod         Tamala Ser PT 04/23/2021  Acute Rehabilitation Services Pager [phone] Office [phone]

## 2021-04-23 NOTE — Plan of Care (Signed)
°  Problem: Education: Goal: Knowledge of General Education information will improve Description: Including pain rating scale, medication(s)/side effects and non-pharmacologic comfort measures Outcome: Progressing   Problem: Health Behavior/Discharge Planning: Goal: Ability to manage health-related needs will improve Outcome: Progressing   Problem: Clinical Measurements: Goal: Ability to maintain clinical measurements within normal limits will improve Outcome: Progressing Goal: Will remain free from infection Outcome: Progressing Goal: Diagnostic test results will improve Outcome: Progressing Goal: Respiratory complications will improve Outcome: Progressing Goal: Cardiovascular complication will be avoided Outcome: Progressing   Problem: Activity: Goal: Risk for activity intolerance will decrease Outcome: Progressing   Problem: Nutrition: Goal: Adequate nutrition will be maintained Outcome: Progressing   Problem: Coping: Goal: Level of anxiety will decrease Outcome: Progressing   Problem: Elimination: Goal: Will not experience complications related to bowel motility Outcome: Progressing Goal: Will not experience complications related to urinary retention Outcome: Progressing   Problem: Pain Managment: Goal: General experience of comfort will improve Outcome: Progressing   Problem: Safety: Goal: Ability to remain free from injury will improve Outcome: Progressing   Problem: Skin Integrity: Goal: Risk for impaired skin integrity will decrease Outcome: Progressing   Problem: Cardiac: Goal: Ability to maintain an adequate cardiac output will improve Outcome: Progressing   Problem: Metabolic: Goal: Ability to maintain appropriate glucose levels will improve Outcome: Progressing   Problem: Respiratory: Goal: Will regain and/or maintain adequate ventilation Outcome: Progressing   Problem: Urinary Elimination: Goal: Ability to achieve and maintain adequate renal  perfusion and functioning will improve Outcome: Progressing   Cindy S. Clelia Croft BSN, RN, CCRP 04/23/2021 1:39 AM

## 2021-04-23 NOTE — Assessment & Plan Note (Addendum)
-   Has history of phimosis and urinary retention -Placed on Flomax, Lotrisone cream, urology consulted -Seen by Dr. Diona Fanti, recommended Foley catheter -Foley catheter was placed on 04/23/2021.  However patient was not able to void after Foley catheter was removed.  Foley was placed back in and patient was recommended to follow-up with his urologist, Dr. Felipa Eth for voiding trial in 1 week. -Continue Flomax, Lotrisone cream, oxybutynin

## 2021-04-23 NOTE — Plan of Care (Signed)
  Problem: Education: Goal: Knowledge of General Education information will improve Description: Including pain rating scale, medication(s)/side effects and non-pharmacologic comfort measures Outcome: Progressing   Problem: Clinical Measurements: Goal: Ability to maintain clinical measurements within normal limits will improve Outcome: Progressing   Problem: Safety: Goal: Ability to remain free from injury will improve Outcome: Progressing   

## 2021-04-23 NOTE — Progress Notes (Signed)
[name]                                                                               [name], is a 61 y.o. male, DOB - Mar 07, 1961, DGL:[id number] Admit date - 04/21/2021    Outpatient Primary MD for the patient is [name], Charles Found, MD  LOS - 1  days    Brief summary   Patient is a 62 year old male with poorly controlled diabetes mellitus, type I presented to ED with profound DKA, nausea vomiting and dehydration.  Patient was placed on IV insulin drip, IV fluid hydration and admitted for further work-up.   Assessment & Plan    Assessment and Plan: * DKA (diabetic ketoacidosis) (HCC)- (present on admission) - Poorly controlled diabetes, hemoglobin A1c 10, presented with hyperglycemia, nausea and vomiting -CBG 492 at the time of admission, bicarb less than 7, sodium 131.  BHB > 8. -Now transitioned to subcu insulin, follow CBGs closely, adjust per readings -Lives alone, unclear if patient is able to manage his care and insulin at home.  Dehydration- (present on admission) - Presented with profound DKA, patient was placed on IV fluid hydration  Nausea & vomiting- (present on admission) - Likely due to profound DKA, no ongoing active vomiting.  Type 2 diabetes mellitus with complication, with long-term current use of insulin (HCC) - Unclear if patient is taking insulin as recommended by outpatient endocrinology.  On metformin 1000 mg BID, Humalog 3 units 3 times daily, Basaglar 50 units daily -Hemoglobin A1c 10.4 -Patient was transitioned to subcu insulin, continue NovoLog 4 units 3 times daily AC, Semglee 25 units daily,SSI -Received 10 units at 447, at home on Basaglar 50 units daily.  Will give another 10 units, follow CBGs.  Diabetic coordinator consulted -Patient acknowledges missing multiple doses of basal insulin per week on average.   Acute urinary retention- (present on admission) - Has history of phimosis and urinary retention -Placed on  Flomax, Lotrisone cream, urology consulted -Seen by Dr. Retta Diones, recommended Foley catheter  Acute metabolic encephalopathy- (present on admission) - Improving, UA negative for UTI.  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  I discussed with neurosurgery, Dr. Lovell Sheehan who do not feel this is obstructive hydrocephalus, has marked brain atrophy, no neurosurgery intervention needed. 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 -Currently much more improved, alert and oriented x3  Acute prerenal azotemia- (present on admission) - Creatinine 1.16 with dehydration, profound DKA with nausea and vomiting -Patient was placed on IV fluid hydration, creatinine improved to 0.5  HLD (hyperlipidemia)- (present on admission) - Resume Crestor  Leukocytosis- (present on admission) - Likely due to stress demargination -HIV negative, flu, COVID-19 negative, chest x-ray negative for pneumonia UA negative for UTI -Leukocytosis resolved    Code Status: Full CODE STATUS DVT Prophylaxis:  SCDs Start: 04/21/21 2247   Level of Care: Level of care: Med-Surg Family Communication:  Disposition Plan:     Remains inpatient appropriate: Transition to subcu insulin, acute urinary retention issues, transfer to  floor.  Needs PT OT evaluation  Procedures:  None  Consultants:   Urology  Antimicrobials:   Anti-infectives (From admission, onward)    None        Medications  Scheduled Meds:  Chlorhexidine Gluconate Cloth  6 each Topical Daily   clotrimazole-betamethasone   Topical BID   insulin aspart  0-15 Units Subcutaneous TID WC   insulin aspart  4 Units Subcutaneous TID WC   insulin glargine-yfgn  10 Units Subcutaneous Once   [START ON 04/24/2021] insulin glargine-yfgn  25 Units Subcutaneous Daily   mouth rinse  15 mL Mouth Rinse  BID   tamsulosin  0.4 mg Oral Daily   Continuous Infusions:  sodium chloride Stopped (04/23/21 0645)   sodium chloride 0.45 % with kcl Stopped (04/22/21 0145)   PRN Meds:.sodium chloride, acetaminophen **OR** acetaminophen, dextrose, lip balm, ondansetron (ZOFRAN) IV    Subjective:   Charles [name] was seen and examined today.  Had urinary retention issues, phimosis.  Alert and oriented today, appears to be close to his baseline.  Patient denies dizziness, chest pain, shortness of breath, abdominal pain, N/V/D/C.  No fevers. Objective:   Vitals:   04/23/21 0800 04/23/21 0837 04/23/21 0900 04/23/21 1000  BP: (!) 95/58  (!) 106/57 (!) 103/38  Pulse: 78  81 92  Resp: (!) 22  19 19   Temp:  98.1 F (36.7 C)    TempSrc:  Oral    SpO2: 99%  100% 100%  Weight:      Height:        Intake/Output Summary (Last 24 hours) at 04/23/2021 1038 Last data filed at 04/23/2021 0650 Gross per 24 hour  Intake 2831.8 ml  Output 1200 ml  Net 1631.8 ml   Filed Weights   04/22/21 0057 04/22/21 0407 04/23/21 0450  Weight: 55.5 kg 55.5 kg 58.6 kg     Exam General: Alert and oriented x 3, NAD Cardiovascular: S1 S2 auscultated, RRR Respiratory: Clear to auscultation bilaterally, no wheezing, rales or rhonchi Gastrointestinal: Soft, nontender, nondistended, + bowel sounds Ext: no pedal edema bilaterally Neuro: alert and oriented, strength 5/5 in upper and lower extremities   Data Reviewed:  I have personally reviewed following labs and imaging studies   CBC Lab Results  Component Value Date   WBC 8.9 04/23/2021   RBC 3.81 (L) 04/23/2021   HGB 12.0 (L) 04/23/2021   HCT 32.0 (L) 04/23/2021   MCV 84.0 04/23/2021   MCH 31.5 04/23/2021   PLT 143 (L) 04/23/2021   MCHC 37.5 (H) 04/23/2021   RDW 12.2 04/23/2021   LYMPHSABS 1.0 04/22/2021   MONOABS 0.6 04/22/2021   EOSABS 0.0 04/22/2021   BASOSABS 0.0 04/22/2021     Last metabolic panel Lab Results  Component Value Date   NA 136  04/23/2021   K 3.3 (L) 04/23/2021   CL 113 (H) 04/23/2021   CO2 18 (L) 04/23/2021   BUN 25 (H) 04/23/2021   CREATININE 0.58 (L) 04/23/2021   GLUCOSE 181 (H) 04/23/2021   GFRNONAA >60 04/23/2021   CALCIUM 8.3 (L) 04/23/2021   PHOS 3.7 04/22/2021   PROT 6.9 04/22/2021   ALBUMIN 3.9 04/22/2021   BILITOT 0.9 04/22/2021   ALKPHOS 98 04/22/2021   AST 22 04/22/2021   ALT 25 04/22/2021   ANIONGAP 5 04/23/2021    CBG (last 3)  Recent Labs    04/23/21 0403 04/23/21 0504 04/23/21 0738  GLUCAP 170* 160* 162*        Radiology Studies: CT HEAD  WO CONTRAST ( )  Addendum Date: 04/22/2021   ADDENDUM REPORT: 04/22/2021 13:03 ADDENDUM: Correction: "Foramina monroe" in the report should be foramen of Monro. This was a dictation error. Electronically Signed   By: Feliberto Harts M.D.   On: 04/22/2021 13:03   Result Date: 04/22/2021 CLINICAL DATA:  Mental status change, unknown cause EXAM: CT HEAD  IMPRESSION: 1. Approximately 5 mm hyperdense lesion at the foramina [place], compatible with colloid cyst. 2. Ventriculomegaly that is out of proportion to the degree of sulcal volume loss, concerning for obstructive hydrocephalus given the above findings. Mild periventricular hypoattenuation may represent transependymal flow of CSF. Recommend neurosurgical consultation. Findings and recommendations discussed with Dr. Isidoro Donning via telephone at 11:05 a.m. Electronically Signed: By: Feliberto Harts M.D. On: 04/22/2021 11:09   MR BRAIN WO CONTRAST  Result Date: 04/22/2021 CLINICAL DATA:  Mental status change, unknown cause hydrocephalus EXAM: IMPRESSION: 1. 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 2. Mild periventricular T2/FLAIR hyperintensity which may represent transependymal flow of CSF versus chronic microvascular ischemic disease. 3. 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 Electronically Signed   By: Feliberto Harts M.D.   On: 04/22/2021 15:11   DG  Chest Port 1 View  Result Date: 04/22/2021 CLINICAL DATA:  61 year old male with history of diabetic ketoacidosis. EXAM: IMPRESSION: 1. Low lung volumes without radiographic evidence of acute cardiopulmonary disease. Electronically Signed   By: Trudie Reed M.D.   On: 04/22/2021 05:02       Roben Schliep M.D. [name] 04/23/2021, 10:38 AM  Available via Epic secure chat 7am-7pm After 7 pm, please refer to night coverage provider listed on amion.

## 2021-04-23 NOTE — Plan of Care (Signed)
  Problem: Clinical Measurements: Goal: Respiratory complications will improve Outcome: Progressing Goal: Cardiovascular complication will be avoided Outcome: Progressing   

## 2021-04-23 NOTE — Consult Note (Signed)
Urology Consult  Consulting MD: Rai  CC: Urinary difficulties, phimosis  HPI: This is a 61year old male in for medical condition, admitted for diabetic ketoacidosis.  He does have a history of phimosis and recently saw Dr. Michaelle Birks in our [place] office.  It was recommended that he use Lotrisone cream for the balanitis.  Eventual circumcision could be an alternative.  He was also placed on tamsulosin for lower urinary tract symptomatology.  Residual urine volume at the office visit was negligible.  In the hospital he has had incomplete emptying of his bladder.  He has had in and out catheterization.  He does not necessarily feel the urge to void.  Residual urine volumes/bladder scans have been over 300 mL.  Urologic consultation is requested.  PMH: Past Medical History:  Diagnosis Date   Arthritis    Diabetes mellitus without complication (HCC)    Heart disease    Hypertension     PSH: Past Surgical History:  Procedure Laterality Date   CORONARY ARTERY BYPASS GRAFT      Allergies: No Known Allergies  Medications: Medications Prior to Admission  Medication Sig Dispense Refill Last Dose   aspirin EC 81 MG tablet Take 81 mg by mouth daily. Swallow whole.   Past Week   gabapentin (NEURONTIN) 300 MG capsule Take 1 capsule (300 mg total) by mouth at bedtime. (Patient taking differently: Take 300 mg by mouth once a week.) 30 capsule 3 Past Week   HUMALOG KWIKPEN 100 UNIT/ML KwikPen 15 Units once a week.   Past Week   Insulin Glargine (BASAGLAR KWIKPEN) 100 UNIT/ML Inject 10 Units into the skin once a week.   Past Week   metFORMIN (GLUCOPHAGE) 1000 MG tablet Take 1,000 mg by mouth once a week.   Past Week   rosuvastatin (CRESTOR) 40 MG tablet Take 40 mg by mouth once a week.   Past Week   blood glucose meter kit and supplies Dispense based on patient and insurance preference. Use up to four times daily as directed. (FOR ICD-10 E10.9, E11.9). 1 each 11    Insulin Glargine  (BASAGLAR KWIKPEN) 100 UNIT/ML Inject 50 Units into the skin daily. Titration dose. Increase as directed 15 mL 1    traZODone (DESYREL) 50 MG tablet Take 0.5-1 tablets (25-50 mg total) by mouth at bedtime as needed for sleep. (Patient not taking: Reported on 04/21/2021) 30 tablet 3 Completed Course     Social History: Social History   Socioeconomic History   Marital status: Single    Spouse name: Not on file   Number of children: 0   Years of education: masters   Highest education level: Not on file  Occupational History   Occupation: semi retired/technology  Tobacco Use   Smoking status: Never   Smokeless tobacco: Never  Vaping Use   Vaping Use: Never used  Substance and Sexual Activity   Alcohol use: Not Currently    Comment: none in 6 months may open a bottle of wine tonight 11/1112022   Drug use: Never   Sexual activity: Not on file  Other Topics Concern   Not on file  Social History Narrative   Right handed-    Caffeine - 3 cups per day    Social Determinants of Health   Financial Resource Strain: Not on file  Food Insecurity: Not on file  Transportation Needs: Not on file  Physical Activity: Not on file  Stress: Not on file  Social Connections: Not on file  Intimate  Partner Violence: Not on file    Family History: Family History  Problem Relation Age of Onset   Diabetes Mother    Hypertension Mother    Diabetes Father    Hypertension Father    Heart attack Father    Diabetes Sister    Diabetes Sister     Review of Systems: Positive: Urinary frequency, foreskin issues Negative: .  A further 10 point review of systems was negative except what is listed in the HPI.  Physical Exam: $RemoveBefore'@VITALS2'nNezHqQLfyIKB$ @ General: No acute distress.  Very lethargic. Head:  Normocephalic.  Atraumatic. ENT:  EOMI.  Mucous membranes moist Neck:  Supple.  No lymphadenopathy. CV:  Regular rate. Pulmonary: Equal effort bilaterally.  Skin:  Normal turgor.  No visible rash. Extremity: No  gross deformity of extremities.  Neurologic: Alert. Appropriate mood. Penis:  Uncircumcised with moderate phimosis.  I am able to identify the urethral meatus.  No lesions. Urethra: Orthotopic meatus. Scrotum: No lesions.  No ecchymosis.  No erythema. Testicles: Descended bilaterally.  No masses bilaterally. Epididymis: Palpable bilaterally.  Non Tender to palpation.  Studies:  Recent Labs    04/22/21 0050 04/23/21 0250  HGB 16.2 12.0*  WBC 17.1* 8.9  PLT 273 143*    Recent Labs    04/22/21 2313 04/23/21 0250  NA 137 136  K 3.2* 3.3*  CL 113* 113*  CO2 18* 18*  BUN 28* 25*  CREATININE 0.69 0.58*  CALCIUM 8.6* 8.3*  GFRNONAA >60 >60     No results for input(s): INR, APTT in the last 72 hours.  Invalid input(s): PT   Invalid input(s): ABG    Assessment: 1.  Multiple medical issues leading the patient to be quite lethargic, currently bedbound  2.  Phimosis, longstanding  3.  High urinary residual volumes.  It is not due to the phimosis as there is no obstruction from this  Plan: At this point I would recommend Foley catheter placement as he probably will need in and out catheterizations or bladder drainage until he becomes more ambulatory/stronger  Voiding trial at your leisure  Reconsult as needed    [phone]

## 2021-04-24 DIAGNOSIS — R197 Diarrhea, unspecified: Secondary | ICD-10-CM | POA: Diagnosis present

## 2021-04-24 LAB — BASIC METABOLIC PANEL
Anion gap: 9 (ref 5–15)
BUN: 15 mg/dL (ref 6–20)
CO2: 19 mmol/L — ABNORMAL LOW (ref 22–32)
Calcium: 8.3 mg/dL — ABNORMAL LOW (ref 8.9–10.3)
Chloride: 105 mmol/L (ref 98–111)
Creatinine, Ser: 0.6 mg/dL — ABNORMAL LOW (ref 0.61–1.24)
GFR, Estimated: >60 mL/min (ref >60)
Glucose, Bld: 247 mg/dL — ABNORMAL HIGH (ref 70–99)
Potassium: 3.3 mmol/L — ABNORMAL LOW (ref 3.5–5.1)
Sodium: 133 mmol/L — ABNORMAL LOW (ref 135–145)

## 2021-04-24 LAB — GLUCOSE, CAPILLARY
Glucose-Capillary: 193 mg/dL — ABNORMAL HIGH (ref 70–99)
Glucose-Capillary: 109 mg/dL — ABNORMAL HIGH (ref 70–99)
Glucose-Capillary: 236 mg/dL — ABNORMAL HIGH (ref 70–99)
Glucose-Capillary: 139 mg/dL — ABNORMAL HIGH (ref 70–99)

## 2021-04-24 MED ORDER — LOPERAMIDE HCL 2 MG PO CAPS: 2.0000 mg | ORAL_CAPSULE | ORAL | Status: DC | PRN

## 2021-04-24 MED ORDER — POTASSIUM CHLORIDE CRYS ER 20 MEQ PO TBCR
40.0000 meq | EXTENDED_RELEASE_TABLET | Freq: Once | ORAL | Status: AC
  Administered 2021-04-24: 40 meq via ORAL
  Filled 2021-04-24: qty 2

## 2021-04-24 MED ORDER — INSULIN GLARGINE-YFGN 100 UNIT/ML ~~LOC~~ SOLN
25.0000 [IU] | Freq: Every day | SUBCUTANEOUS | Status: DC
  Administered 2021-04-24: 25 [IU] via SUBCUTANEOUS
  Filled 2021-04-24 (×2): qty 0.25

## 2021-04-24 MED ORDER — LOPERAMIDE HCL 2 MG PO CAPS
4.0000 mg | ORAL_CAPSULE | Freq: Once | ORAL | Status: AC
  Administered 2021-04-24: 4 mg via ORAL
  Filled 2021-04-24: qty 2

## 2021-04-24 MED ORDER — PANTOPRAZOLE SODIUM 40 MG PO TBEC
40.0000 mg | DELAYED_RELEASE_TABLET | Freq: Every day | ORAL | Status: DC
  Administered 2021-04-24 – 2021-04-25 (×2): 40 mg via ORAL
  Filled 2021-04-24 (×2): qty 1

## 2021-04-24 MED ORDER — INSULIN ASPART 100 UNIT/ML IJ SOLN
5.0000 [IU] | Freq: Three times a day (TID) | INTRAMUSCULAR | Status: DC
  Administered 2021-04-24 – 2021-04-25 (×4): 5 [IU] via SUBCUTANEOUS

## 2021-04-24 NOTE — Progress Notes (Signed)
[name]                                                                               Charles [name], is a 61 y.o. male, DOB - August 16, 1960, JAS:[id number] Admit date - 04/21/2021    Outpatient Primary MD for the patient is [name], [name], MD  LOS - 2  days    Brief summary   Patient is a 61 year old male with poorly controlled diabetes mellitus, type I presented to ED with profound DKA, nausea vomiting and dehydration.  Patient was placed on IV insulin drip, IV fluid hydration and admitted for further work-up. Patient has been transitioned to subcu insulin.   PT recommended SNF   Assessment & Plan    Assessment and Plan: * DKA (diabetic ketoacidosis) (HCC)- (present on admission) - Poorly controlled diabetes, hemoglobin A1c 10, presented with hyperglycemia, nausea and vomiting -CBG 492 at the time of admission, bicarb less than 7, sodium 131.  BHB > 8. -Lives alone, unclear if patient is able to manage his care and insulin at home. -Currently transitioned to subcu insulin  Dehydration- (present on admission) - Presented with profound DKA, patient was placed on IV fluid hydration  Nausea & vomiting- (present on admission) - Likely due to profound DKA -Resolved, tolerating diet  Type 2 diabetes mellitus with complication, with long-term current use of insulin (HCC) - Unclear if patient is taking insulin as recommended by outpatient endocrinology.  On metformin 1000 mg BID, Humalog 3 units 3 times daily, Basaglar 50 units daily (not consistently taking) -Hemoglobin A1c 10.4 -Continue Semglee 25 units daily, NovoLog 5 units 3 times daily AC, sliding scale insulin   Acute urinary retention- (present on admission) - Has history of phimosis and urinary retention -Placed on Flomax, Lotrisone cream, urology consulted -Seen by Dr. Retta Diones, continue Foley catheter  Acute metabolic encephalopathy- (present on admission) - Improving, UA negative for UTI.   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  I discussed with neurosurgery, Dr. Lovell Sheehan who do not feel this is obstructive hydrocephalus, has marked brain atrophy, no neurosurgery intervention needed. 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 Difficile. -Will give loperamide x1  HLD (hyperlipidemia)- (present on admission) - Resume Crestor  Leukocytosis- (present on admission) - Likely due to stress demargination -HIV negative, flu, COVID-19 negative, chest x-ray negative for pneumonia UA negative for UTI -Leukocytosis resolved   Code Status: Full code DVT Prophylaxis:  SCDs Start: 04/21/21 2247   Level of Care: Level of care: Med-Surg Family Communication: called patient's sister, Lizbeth Bark, unable to make contact  Disposition Plan:     Remains inpatient appropriate:  diarrhea, needs SNF.  Patient feels he can manage the insulin at home however  presented with profound DKA and acknowledged that he has been missing insulin doses during the week.  Lives alone.  Unable to reach his sister for baseline.  Procedures:  None   Consultants:   None   Antimicrobials:  None   Medications  Scheduled Meds:  Chlorhexidine Gluconate Cloth  6 each Topical Daily   clotrimazole-betamethasone   Topical BID   insulin aspart  0-15 Units Subcutaneous TID WC   insulin aspart  5 Units Subcutaneous TID WC   insulin glargine-yfgn  25 Units Subcutaneous Daily   mouth rinse  15 mL Mouth Rinse BID    pantoprazole  40 mg Oral Q0600   tamsulosin  0.4 mg Oral Daily   Continuous Infusions:  sodium chloride Stopped (04/23/21 0645)   PRN Meds:.sodium chloride, acetaminophen **OR** acetaminophen, alum & mag hydroxide-simeth, dextrose, lip balm, loperamide, ondansetron (ZOFRAN) IV, oxybutynin    Subjective:   Charles [name] was seen and examined today.  + Diarrhea today.  No fever chills abdominal pain nausea or vomiting.  Tolerating diet.  States he is feeling good and can manage insulin at home, no acute events overnight. Objective:   Vitals:   04/23/21 1843 04/23/21 2039 04/24/21 0107 04/24/21 0439  BP: 113/67 107/69 99/70 (!) 110/57  Pulse: 80 87 88 89  Resp:  16 18 18   Temp:  98.4 F (36.9 C) 97.6 F (36.4 C) 98.4 F (36.9 C)  TempSrc:  Oral Oral Oral  SpO2:  100% 98% 97%  Weight:    58 kg  Height:        Intake/Output Summary (Last 24 hours) at 04/24/2021 1218 Last data filed at 04/24/2021 0645 Gross per 24 hour  Intake 650.58 ml  Output 1350 ml  Net -699.42 ml   Filed Weights   04/22/21 0407 04/23/21 0450 04/24/21 0439  Weight: 55.5 kg 58.6 kg 58 kg     Exam General: Alert and oriented x 3, NAD Cardiovascular: S1 S2 auscultated, no murmurs, RRR Respiratory: Clear to auscultation bilaterally, no wheezing Gastrointestinal: Soft, nontender, nondistended, + bowel sounds Ext: no pedal edema bilaterally Neuro: moving all 4 extremities spontaneously    Data Reviewed:  I have personally reviewed following labs and imaging studies   CBC Lab Results  Component Value Date   WBC 8.9 04/23/2021   RBC 3.81 (L) 04/23/2021   HGB 12.0 (L) 04/23/2021   HCT 32.0 (L) 04/23/2021   MCV 84.0 04/23/2021   MCH 31.5 04/23/2021   PLT 143 (L) 04/23/2021   MCHC 37.5 (H) 04/23/2021   RDW 12.2 04/23/2021   LYMPHSABS 1.0 04/22/2021   MONOABS 0.6 04/22/2021   EOSABS 0.0 04/22/2021   BASOSABS 0.0 04/22/2021     Last metabolic panel Lab Results  Component Value Date    NA 133 (L) 04/24/2021   K 3.3 (L) 04/24/2021   CL 105 04/24/2021   CO2 19 (L) 04/24/2021   BUN 15 04/24/2021   CREATININE 0.60 (L) 04/24/2021   GLUCOSE 247 (H) 04/24/2021   GFRNONAA >60 04/24/2021   CALCIUM 8.3 (L) 04/24/2021   PHOS 3.7 04/22/2021   PROT 6.9 04/22/2021   ALBUMIN 3.9 04/22/2021   BILITOT 0.9 04/22/2021   ALKPHOS 98 04/22/2021   AST 22 04/22/2021   ALT 25 04/22/2021   ANIONGAP 9 04/24/2021    CBG (last 3)  Recent Labs    04/23/21 2041 04/24/21 0744 04/24/21 1208  GLUCAP 216* 193* 109*        Radiology Studies: MR BRAIN WO CONTRAST  Result Date: 04/22/2021 CLINICAL DATA:  Mental status change, unknown cause hydrocephalus IMPRESSION: 1. 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 2. Mild periventricular T2/FLAIR hyperintensity which may represent transependymal flow of CSF versus chronic microvascular ischemic disease. 3. 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 Electronically Signed   By: Feliberto Harts M.D.   On: 04/22/2021 15:11       Brinsley Wence M.D. [name] 04/24/2021, 12:18 PM  Available via Epic secure chat 7am-7pm After 7 pm, please refer to night coverage provider listed on amion.

## 2021-04-24 NOTE — Progress Notes (Signed)
Physical Therapy Treatment Patient Details Name: Charles [name] MRN: [id number] DOB: 08/01/1960 Today's Date: 04/24/2021   History of Present Illness 61 year old male with poorly controlled diabetes mellitus, type I presented to ED with profound DKA, nausea vomiting and dehydration. PMH includes HTN, CABG.    PT Comments    Pt very motivated to mobilize and with noted improvement in activity tolerance but continues limited by significant balance deficits, impulsive actions and poor insight into deficits.  Recommendations for follow up therapy are one component of a multi-disciplinary discharge planning process, led by the attending physician.  Recommendations may be updated based on patient status, additional functional criteria and insurance authorization.  Follow Up Recommendations  Skilled nursing-short term rehab (<3 hours/day)     Assistance Recommended at Discharge Frequent or constant Supervision/Assistance  Patient can return home with the following A lot of help with walking and/or transfers;Assistance with cooking/housework;Direct supervision/assist for medications management;Assist for transportation;Help with stairs or ramp for entrance;A little help with bathing/dressing/bathroom   Equipment Recommendations  Other (comment)    Recommendations for Other Services       Precautions / Restrictions Precautions Precautions: Fall Restrictions Weight Bearing Restrictions: No     Mobility  Bed Mobility Overal bed mobility: Needs Assistance Bed Mobility: Supine to Sit, Sit to Supine     Supine to sit: Min assist Sit to supine: Min guard   General bed mobility comments: assist to bring trunk to upright    Transfers Overall transfer level: Needs assistance Equipment used: Rolling walker (2 wheels) Transfers: Sit to/from Stand Sit to Stand: Min assist           General transfer comment: cues for use of UEs to self assist and for safe transition position     Ambulation/Gait Ambulation/Gait assistance: +2 safety/equipment, Min assist, Mod assist Gait Distance (Feet): 160 Feet Assistive device: Rolling walker (2 wheels) Gait Pattern/deviations: Step-through pattern, Narrow base of support, Trunk flexed, Decreased dorsiflexion - right, Decreased dorsiflexion - left       General Gait Details: cues for posture, position from RW and to walk fwd with pt attempting to twist sideways and continue fwd momentum.   Stairs             Wheelchair Mobility    Modified Rankin (Stroke Patients Only)       Balance Overall balance assessment: Needs assistance   Sitting balance-Leahy Scale: Fair     Standing balance support: Bilateral upper extremity supported Standing balance-Leahy Scale: Poor                              Cognition Arousal/Alertness: Awake/alert Behavior During Therapy: Flat affect, Impulsive Overall Cognitive Status: Impaired/Different from baseline Area of Impairment: Orientation, Safety/judgement                 Orientation Level: Time       Safety/Judgement: Decreased awareness of deficits, Decreased awareness of safety              Exercises      General Comments        Pertinent Vitals/Pain Pain Assessment Pain Assessment: No/denies pain    Home Living                          Prior Function            PT Goals (current goals can now be found in the care  plan section) Acute Rehab PT Goals Patient Stated Goal: walk and go home PT Goal Formulation: With patient Time For Goal Achievement: 05/07/21 Potential to Achieve Goals: Good Progress towards PT goals: Progressing toward goals    Frequency    Min 3X/week      PT Plan Current plan remains appropriate    Co-evaluation              AM-PAC PT "6 Clicks" Mobility   Outcome Measure  Help needed turning from your back to your side while in a flat bed without using bedrails?: A Little Help  needed moving from lying on your back to sitting on the side of a flat bed without using bedrails?: A Little Help needed moving to and from a bed to a chair (including a wheelchair)?: A Lot Help needed standing up from a chair using your arms (e.g., wheelchair or bedside chair)?: A Lot Help needed to walk in hospital room?: A Lot Help needed climbing 3-5 steps with a railing? : A Lot 6 Click Score: 14    End of Session Equipment Utilized During Treatment: Gait belt Activity Tolerance: Patient tolerated treatment well Patient left: in bed;with call bell/phone within reach;with bed alarm set Nurse Communication: Mobility status PT Visit Diagnosis: Muscle weakness (generalized) (M62.81);Difficulty in walking, not elsewhere classified (R26.2);Other abnormalities of gait and mobility (R26.89)     Time: 1941-7408 PT Time Calculation (min) (ACUTE ONLY): 16 min  Charges:  $Gait Training: 8-22 mins                     Mauro Kaufmann PT Acute Rehabilitation Services Pager [phone] Office [phone]    Frances Ambrosino 04/24/2021, 4:05 PM

## 2021-04-24 NOTE — TOC Initial Note (Addendum)
Transition of Care [facility]) - Initial/Assessment Note    Patient Details  Name: Charles [name] MRN: [id number] Date of Birth: Dec 18, 1960  [facility]) CM/SW Contact:    Darleene Cleaver, LCSW Phone Number: 04/24/2021, 4:33 PM  Clinical Narrative:                  CSW received consult that patient may need SNF for rehab or home health services.  CSW spoke to patient and he is not interested in SNF or home health.  Patient is a 61 year old male who is alert and oriented x4.  PT saw patient and they recommended SNF for short term rehab.  CSW spoke to patient, and he does not want to go to a SNF for rehab, and he does not want any home health services.  CSW signing off please reconsult if social work needs arise.  CSW suggested he work harder on trying to be more compliant on his insulin so he does not have to be readmitted.  Expected Discharge Plan: Home/Self Care Barriers to Discharge: Continued Medical Work up   Patient Goals and CMS Choice Patient states their goals for this hospitalization and ongoing recovery are:: To return back home.      Expected Discharge Plan and Services Expected Discharge Plan: Home/Self Care       Living arrangements for the past 2 months: Single Family Home                                      Prior Living Arrangements/Services Living arrangements for the past 2 months: Single Family Home Lives with:: Self Patient language and need for interpreter reviewed:: No        Need for Family Participation in Patient Care: No (Comment) Care giver support system in place?: No (comment)   Criminal Activity/Legal Involvement Pertinent to Current Situation/Hospitalization: No - Comment as needed  Activities of Daily Living Home Assistive Devices/Equipment: None ADL Screening (condition at time of admission) Patient's cognitive ability adequate to safely complete daily activities?: No Is the patient deaf or have difficulty hearing?:  No Does the patient have difficulty seeing, even when wearing glasses/contacts?: No Does the patient have difficulty concentrating, remembering, or making decisions?: Yes Patient able to express need for assistance with ADLs?: No Does the patient have difficulty dressing or bathing?: Yes Independently performs ADLs?: No Does the patient have difficulty walking or climbing stairs?: Yes Weakness of Legs: Both Weakness of Arms/Hands: Both  Permission Sought/Granted   Permission granted to share information with : Yes, Verbal Permission Granted  Share Information with NAME: Lizbeth Bark Sister   [phone]  Althia Forts   [phone]  Regan Lemming   [phone]           Emotional Assessment Appearance:: Appears stated age Attitude/Demeanor/Rapport: Engaged Affect (typically observed): Accepting, Appropriate, Calm, Stable Orientation: : Oriented to Self, Oriented to Place, Oriented to  Time, Oriented to Situation Alcohol / Substance Use: Not Applicable Psych Involvement: No (comment)  Admission diagnosis:  DKA (diabetic ketoacidosis) (HCC) [E11.10] Hyperglycemia [R73.9] Hypothermia, initial encounter [T68.XXXA] Patient Active Problem List   Diagnosis Date Noted   Diarrhea 04/24/2021   Acute urinary retention 04/23/2021   Nausea & vomiting 04/22/2021   Dehydration 04/22/2021   Acute prerenal azotemia 04/22/2021   Leukocytosis 04/22/2021   HLD (hyperlipidemia) 04/22/2021   Acute metabolic encephalopathy 04/22/2021   DKA (diabetic ketoacidosis) (HCC) 04/21/2021  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/23/2021   Type 2 diabetes mellitus with complication, with long-term current use of insulin (HCC) 03/23/2021   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

## 2021-04-24 NOTE — Plan of Care (Signed)
°  Problem: Coping: Goal: Level of anxiety will decrease Outcome: Progressing   Problem: Pain Managment: Goal: General experience of comfort will improve Outcome: Progressing   Problem: Pain Managment: Goal: General experience of comfort will improve Outcome: Progressing   Problem: Safety: Goal: Ability to remain free from injury will improve Outcome: Progressing

## 2021-04-24 NOTE — Assessment & Plan Note (Addendum)
-   Unclear etiology, no fever, chills, abdominal pain or leukocytosis, low suspicion of C. Difficile. -Resolved

## 2021-04-25 LAB — VITAMIN B1: Vitamin B1 (Thiamine): 127.5 nmol/L (ref 66.5–200.0)

## 2021-04-25 LAB — GLUCOSE, CAPILLARY
Glucose-Capillary: 180 mg/dL — ABNORMAL HIGH (ref 70–99)
Glucose-Capillary: 160 mg/dL — ABNORMAL HIGH (ref 70–99)
Glucose-Capillary: 184 mg/dL — ABNORMAL HIGH (ref 70–99)
Glucose-Capillary: 317 mg/dL — ABNORMAL HIGH (ref 70–99)

## 2021-04-25 MED ORDER — BASAGLAR KWIKPEN 100 UNIT/ML ~~LOC~~ SOPN: 28.0000 [IU] | PEN_INJECTOR | Freq: Every day | SUBCUTANEOUS | 2 refills | Status: DC

## 2021-04-25 MED ORDER — CLOTRIMAZOLE-BETAMETHASONE 1-0.05 % EX CREA: TOPICAL_CREAM | Freq: Two times a day (BID) | CUTANEOUS | 4 refills | Status: DC

## 2021-04-25 MED ORDER — INSULIN GLARGINE-YFGN 100 UNIT/ML ~~LOC~~ SOLN
28.0000 [IU] | Freq: Every day | SUBCUTANEOUS | Status: DC
  Administered 2021-04-25: 28 [IU] via SUBCUTANEOUS
  Filled 2021-04-25 (×2): qty 0.28

## 2021-04-25 MED ORDER — OXYBUTYNIN CHLORIDE 5 MG PO TABS: 5.0000 mg | ORAL_TABLET | Freq: Three times a day (TID) | ORAL | 1 refills | Status: DC | PRN

## 2021-04-25 MED ORDER — PANTOPRAZOLE SODIUM 40 MG PO TBEC: 40.0000 mg | DELAYED_RELEASE_TABLET | Freq: Every day | ORAL | 1 refills | Status: AC

## 2021-04-25 MED ORDER — FREESTYLE LIBRE 2 SENSOR MISC
3.0000 [IU] | Freq: Three times a day (TID) | 3 refills | Status: DC
Start: 2021-04-25 — End: 2021-09-10

## 2021-04-25 MED ORDER — ROSUVASTATIN CALCIUM 40 MG PO TABS: 40.0000 mg | ORAL_TABLET | Freq: Every day | ORAL | 3 refills | Status: DC

## 2021-04-25 MED ORDER — HUMALOG KWIKPEN 100 UNIT/ML ~~LOC~~ SOPN: 3.0000 [IU] | PEN_INJECTOR | Freq: Three times a day (TID) | SUBCUTANEOUS | 3 refills | Status: DC

## 2021-04-25 MED ORDER — TAMSULOSIN HCL 0.4 MG PO CAPS: 0.4000 mg | ORAL_CAPSULE | Freq: Every day | ORAL | 3 refills | Status: DC

## 2021-04-25 NOTE — TOC Transition Note (Signed)
Transition of Care [facility]) - CM/SW Discharge Note   Patient Details  Name: Charles [name] MRN: [id number] Date of Birth: Nov 18, 1960  Transition of Care [facility]) CM/SW Contact:  Lennart Pall, LCSW Phone Number: 04/25/2021, 11:33 AM   Clinical Narrative:    Orders placed for [facility] and PT and alerted by MD that pt is now agreeable to this follow up.  Met with pt who confirms agreeable and has no agency preference.  Referral placed with [facility].  Pt, also, reports that he has a rolling walker at home and declines 3n1 commode.  No further TOC needs.   Final next level of care: [place] Barriers to Discharge: Barriers Resolved   Patient Goals and CMS Choice Patient states their goals for this hospitalization and ongoing recovery are:: To return back home.      Discharge Placement                       Discharge Plan and Services                DME Arranged: N/A DME Agency: NA       HH Arranged: PT, RN [place] Agency: [place] ([facility]) Date [place]: 04/25/21 Time [place]: 1133 Representative spoke with at [place]: [place] ([place]) Interventions     Readmission Risk Interventions No flowsheet data found.

## 2021-04-25 NOTE — Progress Notes (Signed)
Patient is not able to void post FC discontinue. Bladder scanned . Notified MD.

## 2021-04-25 NOTE — Discharge Summary (Signed)
Physician Discharge Summary   [name] MRN: [id number] DOB: November 14, 1960  Admit date:     04/21/2021  Discharge date: 04/25/21  Discharge Physician: Estill Cotta   PCP: Darreld Mclean, MD   Recommendations at discharge:   Continue Foley catheter, recommended to follow-up with his primary urologist, Dr. Felipa Eth in 1 week for voiding trial Placed on Flomax 0.4 mg daily, oxybutynin as needed, Lotrisone cream Home health PT, RN arranged  Discharge Diagnoses:   DKA (diabetic ketoacidosis) ([place])   Type 2 diabetes mellitus with complication, with long-term current use of insulin ([facility])   Nausea & vomiting   Dehydration   Acute prerenal azotemia   Acute metabolic encephalopathy   Acute urinary retention   Leukocytosis   HLD (hyperlipidemia)   Diarrhea     Hospital Course: Patient is a 61 year old male with poorly controlled diabetes mellitus, type I presented to ED with profound DKA, nausea vomiting and dehydration.  Patient was placed on IV insulin drip, IV fluid hydration and admitted for further work-up. Patient has been transitioned to subcu insulin.   PT recommended SNF  Assessment and Plan: * DKA (diabetic ketoacidosis) ([place])- (present on admission) - Poorly controlled diabetes, hemoglobin A1c 10, presented with hyperglycemia, nausea and vomiting -CBG 492 at the time of admission, bicarb less than 7, sodium 131.  BHB > 8. -Lives alone, unclear if patient is able to manage his care and insulin at home.  Patient declines skilled nursing facility, states he will be able to manage his insulin.  Home health RN was arranged. -Currently transitioned to subcu insulin.  Dehydration- (present on admission) - Presented with profound DKA, patient was placed on IV fluid hydration  Nausea & vomiting- (present on admission) - Likely due to profound DKA -Resolved, tolerating diet  Type 2 diabetes mellitus with complication, with long-term current use of insulin  ([place]) - Unclear if patient is taking insulin as recommended by outpatient endocrinology.  On metformin 1000 mg BID, Humalog 3 units 3 times daily, Basaglar 50 units daily (not consistently taking) -Hemoglobin A1c 10.4 -Patient declines skilled nursing facility.  Discussed with diabetic coordinator.  Will continue Basaglar 28 units daily and Humalog 3 units 3 times daily.  Outpatient follow-up with his endocrinologist, Dr Chalmers Cater   Acute urinary retention- (present on admission) - Has history of phimosis and urinary retention -Placed on Flomax, Lotrisone cream, urology consulted -Seen by Dr. Diona Fanti, recommended Foley catheter -Foley catheter was placed on 04/23/2021.  However patient was not able to void after Foley catheter was removed.  Foley was placed back in and patient was recommended to follow-up with his urologist, Dr. Felipa Eth for voiding trial in 1 week. -Continue Flomax, Lotrisone cream, oxybutynin  Acute metabolic encephalopathy- (present on admission) - Improving, UA negative for UTI.  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  I discussed with neurosurgery, Dr. Arnoldo Morale who do not feel this is obstructive hydrocephalus, has marked brain atrophy, no neurosurgery intervention needed. 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 Difficile. -Resolved  HLD (hyperlipidemia)- (present on admission) - Resume Crestor  Leukocytosis- (present on admission) - Likely due to  stress demargination -HIV negative, flu, COVID-19 negative, chest x-ray negative for pneumonia UA negative for UTI -Leukocytosis resolved    Pain control - [place] Controlled Substance Reporting System database was reviewed. and patient was instructed, not to drive, operate heavy machinery, perform activities at heights, swimming or participation in water activities or provide baby-sitting services while on Pain, Sleep and Anxiety Medications; until their outpatient Physician has advised to do so again. Also recommended to not to take more than prescribed Pain, Sleep and Anxiety Medications.   Consultants: Urology Procedures performed: Foley catheter Disposition: Home Diet recommendation:  Discharge Diet Orders (From admission, onward)     Start     Ordered   04/25/21 0000  Diet Carb Modified        04/25/21 1204           Carb modified diet  DISCHARGE MEDICATION: Allergies as of 04/25/2021   No Known Allergies      Medication List     STOP taking these medications    traZODone 50 MG tablet Commonly known as: DESYREL       TAKE these medications    aspirin EC 81 MG tablet Take 81 mg by mouth daily. Swallow whole.   Basaglar KwikPen 100 UNIT/ML Inject 28 Units into the skin daily. What changed:  how much to take additional instructions   blood glucose meter kit and supplies Dispense based on patient and insurance preference. Use up to four times daily as directed. (FOR ICD-10 E10.9, E11.9).   clotrimazole-betamethasone cream Commonly known as: LOTRISONE Apply topically 2 (two) times daily. Apply to foreskin   FreeStyle Libre 2 Sensor Misc 3 Units by Does not apply route 3 (three) times daily with meals.   gabapentin 300 MG capsule Commonly known as: Neurontin Take 1 capsule (300 mg total) by mouth at bedtime. What changed: when to take this   HumaLOG KwikPen 100 UNIT/ML KwikPen Generic drug: insulin lispro Inject 3 Units into the skin 3  (three) times daily before meals. What changed:  how much to take how to take this when to take this   oxybutynin 5 MG tablet Commonly known as: DITROPAN Take 1 tablet (5 mg total) by mouth every 8 (eight) hours as needed for bladder spasms.   pantoprazole 40 MG tablet Commonly known as: PROTONIX Take 1 tablet (40 mg total) by mouth daily. Any generic PPI is okay to dispense   rosuvastatin 40 MG tablet Commonly known as: CRESTOR Take 1 tablet (40 mg total) by mouth daily. What changed: when to take this   tamsulosin 0.4 MG Caps capsule Commonly known as: FLOMAX Take 1 capsule (0.4 mg total) by mouth daily. Start taking on: April 26, 2021        Follow-up [facility].   Why: to provide home health nursing and physical therapy visits Contact information: [address] [place], [place], [place] [address]  [phone]        [name], [name], MD. Schedule an appointment as soon as possible for a visit in 10 day(s).   Specialty: Family Medicine Why: for hospital follow-up, obtain labs Contact information: [address] [place] [address] [phone]         Buford Dresser, MD .   Specialty: Cardiology Contact information: [address] [place] [address] [place] [address]  [phone]         Stoneking, Reece Leader., MD. Schedule an appointment as soon as possible for a visit in 1 week(s).   Specialty: Urology Why: for hospital follow-up, VOIDING TRIAL Contact information: [address] [place] [address] [phone]         Jacelyn Pi, MD. Schedule an appointment as soon as possible for a visit in 2 week(s).   Specialty: Endocrinology Why: for hospital follow-up Contact information: [address] [place] [address] [phone]                 Discharge Exam: Filed Weights   04/22/21 0407 04/23/21 0450 04/24/21 0439  Weight:  55.5 kg 58.6 kg 58 kg   S: Declined skilled nursing facility, wants to go home  Vitals:   04/24/21 1313 04/24/21 1313 04/24/21 2141 04/25/21 0513  BP: 104/63 104/63 112/62 91/61  Pulse: 90 94 88 75  Resp: _0 Temp: 99.2 F (37.3 C) 99.2 F (37.3 C) 97.6 F (36.4 C) 98.7 F (37.1 C)  TempSrc:   Oral   SpO2: 100% 100% 100% 100%  Weight:      Height:        Physical Exam General: Alert and oriented x 3, NAD Cardiovascular: S1 S2 clear, RRR. No pedal edema b/l Respiratory: CTAB, no wheezing, rales or rhonchi Gastrointestinal: Soft, nontender, nondistended, NBS Ext: no pedal edema bilaterally Neuro: no new deficits Skin: No rashes Psych: Normal affect and demeanor, alert and oriented x3    Condition at discharge: good  The results of significant diagnostics from this hospitalization (including imaging, microbiology, ancillary and laboratory) are listed below for reference.   Imaging Studies: DG Thoracic Spine 2 View  Result Date: 04/07/2021 CLINICAL DATA:  Chronic back pain. EXAM: THORACIC SPINE 2 VIEWS COMPARISON:  None. FINDINGS: There is no evidence of thoracic spine fracture. Alignment is normal. Mild multilevel degenerative changes are noted in midthoracic spine. IMPRESSION: Mild multilevel degenerative disc disease. No acute abnormality seen. Electronically Signed   By: Marijo Conception M.D.   On: 04/07/2021 09:58   DG Lumbar Spine Complete  Result Date: 04/07/2021 CLINICAL DATA:  Chronic lower back pain. EXAM: LUMBAR SPINE - COMPLETE 4+ VIEW COMPARISON:  None. FINDINGS: There is no evidence of lumbar spine fracture. Alignment is normal. Intervertebral disc spaces are maintained. Minimal anterior osteophyte formation is noted at L1-2, L2-3 and L3-4. IMPRESSION: Minimal multilevel degenerative changes are noted. No acute abnormality is noted. Aortic Atherosclerosis (ICD10-I70.0). Electronically Signed   By: Marijo Conception M.D.   On: 04/07/2021 10:00   CT HEAD WO  CONTRAST (5MM)  Addendum Date: 04/22/2021   ADDENDUM REPORT: 04/22/2021 13:03 ADDENDUM: Correction: "Foramina monroe" in the report should be foramen of Monro. This was a dictation error. Electronically Signed   By: Margaretha Sheffield M.D.   On: 04/22/2021 13:03   Result Date: 04/22/2021 CLINICAL DATA:  Mental status change, unknown cause EXAM: CT HEAD WITHOUT CONTRAST TECHNIQUE: Contiguous axial images were obtained from the base of the skull through the vertex without intravenous contrast. RADIATION DOSE REDUCTION: This exam was performed according to the departmental dose-optimization program which includes automated exposure control, adjustment of the mA and/or kV according to patient size and/or use of iterative reconstruction technique. COMPARISON:  None. FINDINGS: Motion limited study. Brain: Ventriculomegaly which is out of proportion to the degree of volume loss, concerning for hydrocephalus. Mild periventricular hypoattenuation. Approximately 5 mm hyperdense lesion  at the foramina Max (series 5, image 12), compatible with colloid cyst. No evidence of acute large vascular territory infarct, acute hemorrhage, or extra-axial fluid collection. Vascular: No hyperdense vessel identified. Calcific intracranial atherosclerosis. Skull: No acute fracture Sinuses/Orbits: Right maxillary sinus mucosal thickening, partially imaged. Unremarkable orbits. Other: No mastoid effusions. IMPRESSION: 1. Approximately 5 mm hyperdense lesion at the foramina [place], compatible with colloid cyst. 2. Ventriculomegaly that is out of proportion to the degree of sulcal volume loss, concerning for obstructive hydrocephalus given the above findings. Mild periventricular hypoattenuation may represent transependymal flow of CSF. Recommend neurosurgical consultation. Findings and recommendations discussed with Dr. Tana Coast via telephone at 11:05 a.m. Electronically Signed: By: Margaretha Sheffield M.D. On: 04/22/2021 11:09   MR BRAIN WO  CONTRAST  Result Date: 04/22/2021 CLINICAL DATA:  Mental status change, unknown cause hydrocephalus EXAM: MRI HEAD WITHOUT CONTRAST TECHNIQUE: Multiplanar, multiecho pulse sequences of the brain and surrounding structures were obtained without intravenous contrast. COMPARISON:  Same day CT head. FINDINGS: Brain: No evidence of acute infarct, midline shift, acute hemorrhage, or extra-axial fluid collection. 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 Mild hydrocephalus. Mild periventricular T2/FLAIR hyperintensity. 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 Vascular: Major arterial flow voids are maintained at the skull base. Skull and upper cervical spine: Normal marrow signal. Sinuses/Orbits: Moderate right maxillary sinus mucosal thickening. Otherwise, mild paranasal sinus mucosal thickening. Unremarkable orbits. Other: No mastoid effusions. IMPRESSION: 1. 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 2. Mild periventricular T2/FLAIR hyperintensity which may represent transependymal flow of CSF versus chronic microvascular ischemic disease. 3. 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 Electronically Signed   By: Margaretha Sheffield M.D.   On: 04/22/2021 15:11   MR Lumbar Spine Wo Contrast  Result Date: 04/12/2021 CLINICAL DATA:  Chronic low back pain and bilateral leg weakness. No injury or prior surgery. EXAM: MRI LUMBAR SPINE WITHOUT CONTRAST TECHNIQUE: Multiplanar, multisequence MR imaging of the lumbar spine was performed. No intravenous contrast was administered. COMPARISON:  Lumbar spine x-rays dated April 07, 2021. FINDINGS: Segmentation:  Standard. Alignment:  Physiologic. Vertebrae:  No fracture, evidence of discitis, or bone lesion. Conus medullaris and cauda equina: Conus extends to the L1 level. Conus and cauda equina appear normal. Paraspinal and other soft tissues: Negative. Disc levels: T12-L1:  Negative.  L1-L2:  Minimal disc bulging.  No stenosis. L2-L3:  Mild disc bulging.  No stenosis. L3-L4:  Mild disc bulging.  No stenosis. L4-L5: Mild disc bulging. Right foraminal endplate spurring. Mild bilateral facet arthropathy with small right synovial cyst extending into the paraspinous muscles. Mild bilateral neuroforaminal stenosis. No spinal canal stenosis. L5-S1:  Tiny central disc protrusion.  No stenosis. IMPRESSION: 1. Mild multilevel lumbar spondylosis as described above. Mild bilateral neuroforaminal stenosis at L4-L5. Electronically Signed   By: Titus Dubin M.D.   On: 04/12/2021 15:45   DG Chest Port 1 View  Result Date: 04/22/2021 CLINICAL DATA:  61 year old male with history of diabetic ketoacidosis. EXAM: PORTABLE CHEST 1 VIEW COMPARISON:  No priors. FINDINGS: Lung volumes are low. No consolidative airspace disease. No pleural effusions. No pneumothorax. No pulmonary nodule or mass noted. Pulmonary vasculature and the cardiomediastinal silhouette are within normal limits. Status post median sternotomy for CABG. IMPRESSION: 1. Low lung volumes without radiographic evidence of acute cardiopulmonary disease. Electronically Signed   By: Vinnie Langton M.D.   On: 04/22/2021 05:02    Microbiology: Results for  orders placed or performed during the hospital encounter of 04/21/21  Resp Panel by RT-PCR (Flu A&B, Covid) Nasopharyngeal Swab     Status: None   Collection Time: 04/21/21 10:39 PM   Specimen: Nasopharyngeal Swab; Nasopharyngeal(NP) swabs in vial transport medium  Result Value Ref Range Status   SARS Coronavirus 2 by RT PCR NEGATIVE NEGATIVE Final    Comment: (NOTE) SARS-CoV-2 target nucleic acids are NOT DETECTED.  The SARS-CoV-2 RNA is generally detectable in upper respiratory specimens during the acute phase of infection. The lowest concentration of SARS-CoV-2 viral copies this assay can detect is 138 copies/mL. A negative result does not preclude SARS-Cov-2 infection and should  not be used as the sole basis for treatment or other patient management decisions. A negative result may occur with  improper specimen collection/handling, submission of specimen other than nasopharyngeal swab, presence of viral mutation(s) within the areas targeted by this assay, and inadequate number of viral copies(<138 copies/mL). A negative result must be combined with clinical observations, patient history, and epidemiological information. The expected result is Negative.  Fact Sheet for Patients:  EntrepreneurPulse.com.au  Fact Sheet for Healthcare Providers:  IncredibleEmployment.be  This test is no t yet approved or cleared by the [place] FDA and  has been authorized for detection and/or diagnosis of SARS-CoV-2 by FDA under an Emergency Use Authorization (EUA). This EUA will remain  in effect (meaning this test can be used) for the duration of the COVID-19 declaration under Section 564(b)(1) of the Act, 21 U.S.C.section 360bbb-3(b)(1), unless the authorization is terminated  or revoked sooner.       Influenza A by PCR NEGATIVE NEGATIVE Final   Influenza B by PCR NEGATIVE NEGATIVE Final    Comment: (NOTE) The Xpert Xpress SARS-CoV-2/FLU/RSV plus assay is intended as an aid in the diagnosis of influenza from Nasopharyngeal swab specimens and should not be used as a sole basis for treatment. Nasal washings and aspirates are unacceptable for Xpert Xpress SARS-CoV-2/FLU/RSV testing.  Fact Sheet for Patients: EntrepreneurPulse.com.au  Fact Sheet for Healthcare Providers: IncredibleEmployment.be  This test is not yet approved or cleared by the [place] FDA and has been authorized for detection and/or diagnosis of SARS-CoV-2 by FDA under an Emergency Use Authorization (EUA). This EUA will remain in effect (meaning this test can be used) for the duration of the COVID-19 declaration under  Section 564(b)(1) of the Act, 21 U.S.C. section 360bbb-3(b)(1), unless the authorization is terminated or revoked.  Performed at [facility], [place] [address], [place], [place] [address]   MRSA Next Gen by PCR, Nasal     Status: None   Collection Time: 04/22/21 12:10 AM   Specimen: Nasal Mucosa; Nasal Swab  Result Value Ref Range Status   MRSA by PCR Next Gen NOT DETECTED NOT DETECTED Final    Comment: (NOTE) The GeneXpert MRSA Assay (FDA approved for NASAL specimens only), is one component of a comprehensive MRSA colonization surveillance program. It is not intended to diagnose MRSA infection nor to guide or monitor treatment for MRSA infections. Test performance is not FDA approved in patients less than 75 years old. Performed at [facility], [place] [address] [place], [place] [address]   Urine Culture     Status: None   Collection Time: 04/22/21 12:00 PM   Specimen: Urine, Clean Catch  Result Value Ref Range Status   Specimen Description   Final    URINE, CLEAN CATCH Performed at [facility], [place]  [address] [place], [place] [address]    Special Requests   Final    NONE Performed at [facility], [place] [address], [place], [place] [address]    Culture   Final    NO GROWTH Performed at [facility], [place] [address] [place], [place] [address]    Report Status 04/23/2021 FINAL  Final    Labs: CBC: Recent Labs  Lab 04/21/21 1935 04/21/21 2149 04/22/21 0050 04/23/21 0250  WBC 19.2*  --  17.1* 8.9  NEUTROABS  --   --  15.3*  --   HGB 16.3 15.3 16.2 12.0*  HCT 47.3 45.0 45.1 32.0*  MCV 90.4  --  88.4 84.0  PLT 294  --  273 202*   Basic Metabolic Panel: Recent Labs  Lab 04/21/21 2147 04/21/21 2149 04/22/21 0050 04/22/21 0639 04/22/21 2313 04/23/21 0250 04/24/21 0314  NA 131*   < > 136 135 137 136 133*  K 4.6   < > 3.9 3.7 3.2* 3.3* 3.3*  CL 103   < > 108 107 113* 113* 105  CO2  <7*  --  <7* 10* 18* 18* 19*  GLUCOSE 492*   < > 208* 175* 182* 181* 247*  BUN 46*   < > 44* 43* 28* 25* 15  CREATININE 1.16   < > 1.19 1.29* 0.69 0.58* 0.60*  CALCIUM 9.1  --  9.7 9.3 8.6* 8.3* 8.3*  MG 2.2  --  2.0  --   --   --   --   PHOS  --   --  3.7  --   --   --   --    < > = values in this interval not displayed.   Liver Function Tests: Recent Labs  Lab 04/22/21 0050  AST 22  ALT 25  ALKPHOS 98  BILITOT 0.9  PROT 6.9  ALBUMIN 3.9   CBG: Recent Labs  Lab 04/24/21 1208 04/24/21 1626 04/24/21 2144 04/25/21 0738 04/25/21 1143  GLUCAP 109* 236* 139* 160* 184*    Discharge time spent: greater than 30 minutes.  Signed: Estill Cotta, MD [facility] 04/25/2021

## 2021-04-25 NOTE — Plan of Care (Signed)
  Problem: Pain Managment: Goal: General experience of comfort will improve Outcome: Progressing   Problem: Safety: Goal: Ability to remain free from injury will improve Outcome: Progressing   

## 2021-04-25 NOTE — Evaluation (Signed)
Occupational Therapy Evaluation Patient Details Name: Charles [name] MRN: [id number] DOB: [birth year] Today's Date: 04/25/2021   History of Present Illness 61 year old male with poorly controlled diabetes mellitus, type I presented to ED with profound DKA, nausea vomiting and dehydration. PMH includes HTN, CABG.   Clinical Impression   Patient is a 61 year old male who lives at home alone independently prior level. Currently, patient is min guard with RW to transfer and complete functional mobility. Patient was noted to have LOB during turning with patient noted to rotate trunk prior to hips and then lastly RW. Patient was noted to have decreased activity tolerance, decreased safety awareness, decreased endurance, and increased need for caregiver assistance at this time. Patient will need 24/7 caregiver support to be successful at home. Patient would continue to benefit from skilled OT services at this time while admitted and after d/c to address noted deficits in order to improve overall safety and independence in ADLs.        Recommendations for follow up therapy are one component of a multi-disciplinary discharge planning process, led by the attending physician.  Recommendations may be updated based on patient status, additional functional criteria and insurance authorization.   Follow Up Recommendations  Skilled nursing-short term rehab (<3 hours/day)    Assistance Recommended at Discharge Frequent or constant Supervision/Assistance  Patient can return home with the following A little help with walking and/or transfers;A little help with bathing/dressing/bathroom;Assistance with cooking/housework;Direct supervision/assist for financial management;Assist for transportation;Help with stairs or ramp for entrance;Direct supervision/assist for medications management    Functional Status Assessment  Patient has had a recent decline in their functional status and demonstrates the ability to make  significant improvements in function in a reasonable and predictable amount of time.  Equipment Recommendations  Other (comment);BSC/3in1;Tub/shower seat (RW)    Recommendations for Other Services       Precautions / Restrictions Precautions Precautions: Fall Restrictions Weight Bearing Restrictions: No      Mobility Bed Mobility               General bed mobility comments: up in recliner    Transfers                          Balance Overall balance assessment: Needs assistance   Sitting balance-Leahy Scale: Fair     Standing balance support: Reliant on assistive device for balance, During functional activity, Single extremity supported Standing balance-Leahy Scale: Poor Standing balance comment: static standing with BUE support is fair.                           ADL either performed or assessed with clinical judgement   ADL Overall ADL's : Needs assistance/impaired Eating/Feeding: Set up;Sitting   Grooming: Wash/dry face;Oral care;Sitting;Set up Grooming Details (indicate cue type and reason): in recliner Upper Body Bathing: Set up;Modified independent;Sitting Upper Body Bathing Details (indicate cue type and reason): sitting in recliner with increased time noted to knock items off table without awareness. Lower Body Bathing: Set up;Min guard;Sit to/from stand;Sitting/lateral leans Lower Body Bathing Details (indicate cue type and reason): pooor coordination noted. simulated in standing with RW with min guard Upper Body Dressing : Set up;Sitting   Lower Body Dressing: Min guard;Set up;Sit to/from stand;Sitting/lateral leans Lower Body Dressing Details (indicate cue type and reason): patient was able to don shoes and adjust socks with MI seated in recliner. Toilet Transfer: Physicist, medical  Transfer Details (indicate cue type and reason): patient noted to have one loss of balance with turning with eduaction provided on  how to maintain balace with turns. patient appeats to rotate trunk into turn then BLE and hips after with RW last to turn. Toileting- Architect and Hygiene: Min guard;Sit to/from stand       Functional mobility during ADLs: Min guard;Minimal assistance General ADL Comments: min guard and min A with turns after LOB     Vision Baseline Vision/History: 1 Wears glasses Patient Visual Report: No change from baseline       Perception     Praxis      Pertinent Vitals/Pain Pain Assessment Pain Assessment: No/denies pain     Hand Dominance Right   Extremity/Trunk Assessment Upper Extremity Assessment Upper Extremity Assessment: Overall WFL for tasks assessed   Lower Extremity Assessment Lower Extremity Assessment: Defer to PT evaluation   Cervical / Trunk Assessment Cervical / Trunk Assessment: Kyphotic   Communication Communication Communication: No difficulties   Cognition Arousal/Alertness: Awake/alert Behavior During Therapy: Flat affect, Impulsive Overall Cognitive Status: Impaired/Different from baseline Area of Impairment: Safety/judgement                         Safety/Judgement: Decreased awareness of deficits, Decreased awareness of safety     General Comments: poor safety awareness     General Comments       Exercises     Shoulder Instructions      Home Living Family/patient expects to be discharged to:: Private residence Living Arrangements: Alone     Home Access: Level entry     Home Layout: One level     Bathroom Shower/Tub: Runner, broadcasting/film/video: Rollator (4 wheels)   Additional Comments: reports he has no local family/friends who can asisst at home      Prior Functioning/Environment Prior Level of Function : Independent/Modified Independent             Mobility Comments: pt stated he just got a rollator recently due to lower abdominal pain and weakness          OT Problem List:  Impaired balance (sitting and/or standing);Decreased safety awareness;Decreased activity tolerance;Decreased knowledge of precautions;Decreased knowledge of use of DME or AE;Decreased coordination      OT Treatment/Interventions: Self-care/ADL training;Therapeutic exercise;Neuromuscular education;DME and/or AE instruction;Therapeutic activities;Balance training;Patient/family education    OT Goals(Current goals can be found in the care plan section) Acute Rehab OT Goals Patient Stated Goal: to go home today OT Goal Formulation: With patient Time For Goal Achievement: 05/09/21 Potential to Achieve Goals: Fair  OT Frequency: Min 2X/week    Co-evaluation              AM-PAC OT "6 Clicks" Daily Activity     Outcome Measure Help from another person eating meals?: None Help from another person taking care of personal grooming?: None Help from another person toileting, which includes using toliet, bedpan, or urinal?: A Little Help from another person bathing (including washing, rinsing, drying)?: A Little Help from another person to put on and taking off regular upper body clothing?: A Little Help from another person to put on and taking off regular lower body clothing?: A Little 6 Click Score: 20   End of Session Equipment Utilized During Treatment: Gait belt;Rolling walker (2 wheels) Nurse Communication: Mobility status  Activity Tolerance: Patient tolerated treatment well Patient left: in chair;with call  bell/phone within reach;with chair alarm set  OT Visit Diagnosis: Unsteadiness on feet (R26.81);Other abnormalities of gait and mobility (R26.89);Muscle weakness (generalized) (M62.81)                Time: 1010-1044 OT Time Calculation (min): 34 min Charges:  OT General Charges $OT Visit: 1 Visit OT Evaluation $OT Eval Low Complexity: 1 Low OT Treatments $Self Care/Home Management : 8-22 mins  Sharyn Blitz OTR/L, MS Acute Rehabilitation Department Office#  [phone] Pager# [phone]   Ardyth Harps 04/25/2021, 10:55 AM

## 2021-04-25 NOTE — Progress Notes (Signed)
Patient still unable to void after encouraged po fluids. Bladder scanned again . Notified MD. Order received to insert FC. Discharge package printed and instructions including fc care instructions given to pt. Patient verbalizes understanding.

## 2021-04-25 NOTE — Progress Notes (Signed)
Patient stated he cannot find his wallet and keys and he knew he had them here. Called ICU and security office, I'm informed they don't have them. Nurse put in safety portal. Then patient's friend called back and states they found them at his house.

## 2021-04-25 NOTE — Progress Notes (Addendum)
Inpatient Diabetes Program Recommendations  AACE/ADA: New Consensus Statement on Inpatient Glycemic Control (2015)  Target Ranges:  Prepandial:   less than 140 mg/dL      Peak postprandial:   less than 180 mg/dL (1-2 hours)      Critically ill patients:  140 - 180 mg/dL   Lab Results  Component Value Date   GLUCAP 160 (H) 04/25/2021   HGBA1C 10.4 (H) 04/22/2021    Review of Glycemic Control  Latest Reference Range & Units 04/24/21 12:08 04/24/21 16:26 04/24/21 21:44 04/25/21 07:38  Glucose-Capillary 70 - 99 mg/dL 694 (H) 854 (H) 627 (H) 160 (H)  (H): Data is abnormally high Diabetes history: LADA Outpatient Diabetes medications: Novolog 3 units TID, Basaglar 55 units QD Current orders for Inpatient glycemic control: Semglee 28 units QD, Novolog 5 units TID, Novolog 0-15 units TID  Inpatient Diabetes Program Recommendations:    Noted consult.   Spoke with patient at length regarding outpatient endocrinology. Patient is followed by Dr Talmage Nap outpatient endocrinology. Verified home medications. However, admits to many missed doses due to fear of gaining weight. When asked about hypoglycemia, patient reports, "Oh yes all the time. I talked with Dr Talmage Nap and for some reason she increased my dose." Reviewed patient's current A1c of 10.4%. Explained what a A1c is and what it measures. Also reviewed goal A1c with patient, importance of good glucose control @ home, and blood sugar goals. Reviewed patho of DM, role of pancreas, need for insulin, DKA, signs and symptoms of hypoglycemia, interventions, impact of gaining weight vs dosing vs hypoglycemia, current inpatient glucose trends vs insulin needs compared with home doses, consequences of missing doses, vascular changes and other commorbidities.  Patient uses [facility] and is in need of additional sensors [phone]. Patient did not take device with him last time to appointment with endo. Secure chat sent to Md.  At this time would recommend  continuing on MDIs, however, would reduce Basaglar to 28 units QD and continue with Novolog 3 units TID (Assuming patient is consuming >50% of meals). Feel that dose reduction would help patient with compliance. Patient plans to follow up with Dr Talmage Nap.   Thanks, Lujean Rave, MSN, RNC-OB Diabetes Coordinator [phone] (8a-5p)

## 2021-04-26 ENCOUNTER — Telehealth: Payer: Self-pay

## 2021-04-26 NOTE — Telephone Encounter (Signed)
Transition Care Management Follow-up Telephone Call Date of discharge and from where: 04/25/2021-[facility] How have you been since you were released from the hospital? Feeling very weak but doing ok. Any questions or concerns? No  Items Reviewed: Did the pt receive and understand the discharge instructions provided? Yes  Medications obtained and verified? Yes  Other? Yes  Any new allergies since your discharge? No  Dietary orders reviewed? Yes Do you have support at home? No Patient states he has a neighbor he can call if needed.  Home Care and Equipment/Supplies: Were home health services ordered? yes If so, what is the name of the agency? [facility]  Has the agency set up a time to come to the patient's home? no Were any new equipment or medical supplies ordered?  No What is the name of the medical supply agency? N/a Were you able to get the supplies/equipment? not applicable Do you have any questions related to the use of the equipment or supplies? N/a  Functional Questionnaire: (I = Independent and D = Dependent) ADLs: I  Bathing/Dressing- i  Meal Prep- i  Eating- i  Maintaining continence- i  Transferring/Ambulation- I with walker  Managing Meds- i  Follow up appointments reviewed:  PCP Hospital f/u appt confirmed? Yes  Scheduled to see Dr. Patsy Lager on 05/05/2021 @ 1:20. Specialist Hospital f/u appt confirmed? Yes  Scheduled to see Cardiology on 05/04/2021 @ 9:00. Are transportation arrangements needed? No  If their condition worsens, is the pt aware to call PCP or go to the Emergency Dept.? Yes Was the patient provided with contact information for the PCP's office or ED? Yes Was to pt encouraged to call back with questions or concerns? Yes

## 2021-04-27 ENCOUNTER — Telehealth: Payer: Self-pay | Admitting: Family Medicine

## 2021-04-27 NOTE — Telephone Encounter (Signed)
Left vm to return call to obtain more information.

## 2021-04-27 NOTE — Telephone Encounter (Signed)
"  Doctor [name], I'm in dire need of help. I tried to schedule an appointment with Urologist they want to call to schedule removal of catheter. Any help would be appreciated if someone from your staff can assist. Thank you.   Urologist - Dr. Felipa Eth"  Pt sent this in pt scheduling request in admin pool, I think they meant to send this to [facility]. please advise.

## 2021-04-28 ENCOUNTER — Other Ambulatory Visit: Payer: Self-pay

## 2021-04-28 DIAGNOSIS — Z466 Encounter for fitting and adjustment of urinary device: Secondary | ICD-10-CM

## 2021-04-28 NOTE — Telephone Encounter (Signed)
Urgent referral was placed.

## 2021-04-28 NOTE — Telephone Encounter (Signed)
Pt states he does not have a urologist and is unsure why Dr. Felipa Eth was listed in his message he sent. He will need an urgent referral to get his catheter out. He would like an update when this has been placed as soon as possible.

## 2021-04-28 NOTE — Telephone Encounter (Signed)
Advised pt unable to perform services here would have to contact urologist.

## 2021-04-28 NOTE — Telephone Encounter (Signed)
Pt called office again regarding catheter removed.   Please advise.

## 2021-05-02 ENCOUNTER — Telehealth: Payer: Self-pay | Admitting: Family Medicine

## 2021-05-02 NOTE — Progress Notes (Addendum)
[facility] [address] [place], [place] [address] [phone]  Date:  05/05/2021   Name:  Charles [name]   DOB:  1961-01-27   MRN:  [id number]  PCP:  Darreld Mclean, MD    Chief Complaint: Hospitalization Follow-up (From 04/21/21 for DKA/Concerns/ questions: pt asks about a catheter removal. It was supposed to be removed on Monday. /)   History of Present Illness:  Charles [name] is a 61 y.o. very pleasant male patient who presents with the following:  Patient seen today for hospital follow-up Last seen by myself to establish care in January; referred to me by cardiology who was concerned about extreme weakness and possible neuromuscular disorder.  This is currently in the process of work-up through neurology  He was recently admitted with DKA and acute urinary retention His diabetes is managed by endocrinology-Dr Chalmers Cater.  He does not have a follow-up appointment scheduled, I do not think he has called.  His next appointment is not for 3 months.  He has had DKA in the past - he is not sure what triggered this most recent episode His last episode of DKA was several years ago   His glucose is now running 170- 190; he is using basaglar 50 units and a SS TID depending on his planned carbs   He has not had urinary rentention in the past.  Fully catheter is still in place.  It is uncomfortable although not painful.  He has apparently been in touch with his urologist, Dr. Carlyle Lipa office-not actually schedule an appointment.  He was thinking I could remove his catheter today  He has a friend in town for a few days who is helping him.  He is not currently able to drive due to his weakness He is scheduled to start physical therapy here at the med center next week.  However he notes at that time his friend will have returned home, he is not able to drive due to his physical problems.  He was thinking he might take an [place].  I  suggested doing physical therapy at home and he much prefers this idea  He is also interested in a few other services including Meals on Wheels, home health nursing and possibly a home aide  Admit date:     04/21/2021  Discharge date: 04/25/21   Assessment and Plan: * DKA (diabetic ketoacidosis) ([place])- (present on admission) - Poorly controlled diabetes, hemoglobin A1c 10, presented with hyperglycemia, nausea and vomiting -CBG 492 at the time of admission, bicarb less than 7, sodium 131.  BHB > 8. -Lives alone, unclear if patient is able to manage his care and insulin at home.  Patient declines skilled nursing facility, states he will be able to manage his insulin.  Home health RN was arranged. -Currently transitioned to subcu insulin.   Dehydration- (present on admission) - Presented with profound DKA, patient was placed on IV fluid hydration. Nausea & vomiting- (present on admission) - Likely due to profound DKA -Resolved, tolerating diet Type 2 diabetes mellitus with complication, with long-term current use of insulin (HCC) - Unclear if patient is taking insulin as recommended by outpatient endocrinology.  On metformin 1000 mg BID, Humalog 3 units 3 times daily, Basaglar 50 units daily (not consistently taking) -Hemoglobin A1c 10.4 -Patient declines skilled nursing facility.  Discussed with diabetic coordinator.  Will continue Basaglar 28 units daily and Humalog 3 units 3 times daily.  Outpatient  follow-up with his endocrinologist, Dr Chalmers Cater Acute urinary retention- (present on admission) - Has history of phimosis and urinary retention -Placed on Flomax, Lotrisone cream, urology consulted -Seen by Dr. Diona Fanti, recommended Foley catheter -Foley catheter was placed on 04/23/2021.  However patient was not able to void after Foley catheter was removed.  Foley was placed back in and patient was recommended to follow-up with his urologist, Dr. Felipa Eth for voiding trial in 1 week. -Continue  Flomax, Lotrisone cream, oxybutynin Acute metabolic encephalopathy- (present on admission) - Improving, UA negative for UTI.  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  I discussed with neurosurgery, Dr. Arnoldo Morale who do not feel this is obstructive hydrocephalus, has marked brain atrophy, no neurosurgery intervention needed. 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 Difficile. -Resolved HLD (hyperlipidemia)- (present on admission) - Resume Crestor Leukocytosis- (present on admission) - Likely due to stress demargination -HIV negative, flu, COVID-19 negative, chest x-ray negative for pneumonia UA negative for UTI -Leukocytosis resolved    Per February 1 noted with Dr. 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   Type 2 diabetes mellitus with complication, with long-term current use of insulin ([facility] 03/23/2021   BPH with obstruction/lower urinary tract symptoms 02/08/2021    Phimosis 01/27/2021   Hypertension 12/23/2020   Coronary artery disease involving native coronary artery of native heart without angina pectoris 12/23/2020    Past Medical History:  Diagnosis Date   Arthritis    Diabetes mellitus without complication ([facility]    Heart disease    Hypertension     Past Surgical History:  Procedure Laterality Date   CORONARY ARTERY BYPASS GRAFT      Social History   Tobacco Use   Smoking status: Never   Smokeless tobacco: Never  Vaping Use   Vaping Use: Never used  Substance Use Topics   Alcohol use: Not Currently    Comment: none in 6 months may open a bottle of wine tonight 11/1112022   Drug use: Never    Family History  Problem Relation Age of Onset   Diabetes Mother    Hypertension Mother    Diabetes Father    Hypertension Father    Heart attack Father    Diabetes Sister    Diabetes Sister     No Known Allergies  Medication list has been reviewed and updated.  Current Outpatient Medications on File Prior to Visit  Medication Sig Dispense Refill   aspirin EC 81 MG tablet Take 81 mg by mouth daily. Swallow whole.  blood glucose meter kit and supplies Dispense based on patient and insurance preference. Use up to four times daily as directed. (FOR ICD-10 E10.9, E11.9). 1 each 11   clotrimazole-betamethasone (LOTRISONE) cream Apply topically 2 (two) times daily. Apply to foreskin 45 g 4   Continuous Blood Gluc Sensor (FREESTYLE LIBRE 2 SENSOR) MISC 3 Units by Does not apply route 3 (three) times daily with meals. 1 each 3   gabapentin (NEURONTIN) 300 MG capsule Take 1 capsule (300 mg total) by mouth at bedtime. (Patient taking differently: Take 300 mg by mouth once a week.) 30 capsule 3   HUMALOG KWIKPEN 100 UNIT/ML KwikPen Inject 3 Units into the skin 3 (three) times daily before meals. 15 mL 3   Insulin Glargine (BASAGLAR KWIKPEN) 100 UNIT/ML Inject 28 Units into the skin daily. 15 mL 2   oxybutynin (DITROPAN) 5 MG tablet Take 1  tablet (5 mg total) by mouth every 8 (eight) hours as needed for bladder spasms. 90 tablet 1   pantoprazole (PROTONIX) 40 MG tablet Take 1 tablet (40 mg total) by mouth daily. Any generic PPI is okay to dispense 30 tablet 1   rosuvastatin (CRESTOR) 40 MG tablet Take 1 tablet (40 mg total) by mouth daily. 30 tablet 3   tamsulosin (FLOMAX) 0.4 MG CAPS capsule Take 1 capsule (0.4 mg total) by mouth daily. 30 capsule 3   No current facility-administered medications on file prior to visit.    Review of Systems:  As per HPI- otherwise negative.  Physical Examination: Vitals:   05/05/21 1316  BP: 100/60  Pulse: 87  Resp: 18  SpO2: 99%   Vitals:   05/05/21 1316  Weight: 127 lb 12.8 oz (58 kg)   Body mass index is 20.63 kg/m. Ideal Body Weight:    GEN: no acute distress.  Thin build, sitting in wheelchair.  Appears at baseline HEENT: Atraumatic, Normocephalic.  Ears and Nose: No external deformity. CV: RRR, No M/G/R. No JVD. No thrill. No extra heart sounds. PULM: CTA B, no wheezes, crackles, rhonchi. No retractions. No resp. distress. No accessory muscle use. ABD: S, NT, ND, +BS. No rebound. No HSM. EXTR: No c/c/e PSYCH: Normally interactive. Conversant.  Did not inspect catheter.  Patient states there is no redness or other injury of the penis  Assessment and Plan: Hospital discharge follow-up  Diabetic ketoacidosis without coma associated with type 2 diabetes mellitus ([place]) - Plan: Basic metabolic panel  Generalized weakness - Plan: Ambulatory referral to Home Health  Neuromuscular disorder [facility]) - Plan: Ambulatory referral to [place]  Foley catheter in place  Patient seen today for hospital discharge follow-up.  As above he was recently admitted with diabetic ketoacidosis and also acute urinary retention. His insulin was adjusted and he notes improvement of his glucose control I asked him to please call his endocrinologist today and let her know he was hospitalized,  schedule an appointment Advised patient that I would rather not remove his Foley-if we take it out and he cannot void I cannot replace the catheter.  He states understanding, can tolerate until he sees urology next week.  I called and made appointment for him on Monday, the first we could find  He continues to suffer from significant generalized weakness.  Gave patient information about Meals on Wheels, and various home care services.  Placed referral for home health nursing, home aide if possible, and physical therapy  I asked him to see me in about 6 months or sooner if needed  Extra time needed due to complexity of patient and coordination of care. I spent greater than 40 minutes face-to-face with patient and also making phone calls on his behalf, and getting information about home services  Signed Lamar Blinks, MD  Addnd 2/24- received labs Results for orders placed or performed in visit on 62/03/55  Basic metabolic panel  Result Value Ref Range   Sodium 138 135 - 145 mEq/L   Potassium 3.8 3.5 - 5.1 mEq/L   Chloride 102 96 - 112 mEq/L   CO2 30 19 - 32 mEq/L   Glucose, Bld 96 70 - 99 mg/dL   BUN 7 6 - 23 mg/dL   Creatinine, Ser 0.75 0.40 - 1.50 mg/dL   GFR 99.64 >60.00 mL/min   Calcium 8.9 8.4 - 10.5 mg/dL   Message to pt

## 2021-05-03 NOTE — Telephone Encounter (Signed)
Opened in error

## 2021-05-04 ENCOUNTER — Encounter (HOSPITAL_BASED_OUTPATIENT_CLINIC_OR_DEPARTMENT_OTHER): Payer: Self-pay | Admitting: Cardiology

## 2021-05-04 ENCOUNTER — Ambulatory Visit (HOSPITAL_BASED_OUTPATIENT_CLINIC_OR_DEPARTMENT_OTHER): Payer: Managed Care, Other (non HMO) | Admitting: Cardiology

## 2021-05-04 ENCOUNTER — Other Ambulatory Visit: Payer: Self-pay

## 2021-05-04 VITALS — BP 112/62 | HR 85 | Ht 66.0 in | Wt 124.8 lb

## 2021-05-04 DIAGNOSIS — Z951 Presence of aortocoronary bypass graft: Secondary | ICD-10-CM | POA: Diagnosis not present

## 2021-05-04 DIAGNOSIS — R531 Weakness: Secondary | ICD-10-CM | POA: Diagnosis not present

## 2021-05-04 DIAGNOSIS — I2510 Atherosclerotic heart disease of native coronary artery without angina pectoris: Secondary | ICD-10-CM

## 2021-05-04 DIAGNOSIS — R5383 Other fatigue: Secondary | ICD-10-CM

## 2021-05-04 DIAGNOSIS — E118 Type 2 diabetes mellitus with unspecified complications: Secondary | ICD-10-CM

## 2021-05-04 DIAGNOSIS — Z794 Long term (current) use of insulin: Secondary | ICD-10-CM

## 2021-05-04 NOTE — Progress Notes (Incomplete)
°Cardiology Office Note:   ° °Date:  05/04/2021  ° °ID:  Charles [name], DOB 01/15/1961, MRN [id number] ° °PCP:  [name], Jessica C, MD  °Cardiologist:  Bridgette Christopher, MD ° °Referring MD: No ref. provider found  ° °CC: Follow-up ° °History of Present Illness:   ° °Charles [name] is a 60 y.o. male with a hx of hypertension, diabetes, and arthritis, who is seen for follow-up. I initially met him 03/22/2021 as a new consult at the request of No ref. provider found for the evaluation and management of hypertension and coronary artery disease. ° °Cardiovascular risk factors: °Prior clinical ASCVD:  °Comorbid conditions: Diabetes- First diagnosed as type 2 one year following CABG x4 in 2004, In 2022 he was told he had type 1 diabetes. Previously, he was off of Insulin and lost 20 lbs. He has gained some weight since restarting Insulin. Hypertension - Diagnosed 3 years  ago. Not controlled by medication currently, previously was on lisinopril. °Metabolic syndrome/Obesity: °Chronic inflammatory conditions: °Tobacco use history: None.  °Family history: Severe, Mother and Father died of heart issues. "Everyone" in his family has hyperlipidemia. °Prior cardiac testing and/or incidental findings on other testing (ie coronary calcium): Stress Tests since CABG 2004 °Exercise level: He states he is generally not able to walk due to imbalance, and he becomes short of breath with minimal exertion. This is concerning for him because he is no longer able to exercise, work, or walk without issues. He walks with a cane for assistance. He notes having extreme weakness, and getting out of bed in the morning or getting into his car can be difficult and painful. Also, he has fallen quite a few times. He no longer travels for work after he had a severe fall in a hotel room. 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  °Current diet: Has also quit drinking alcohol due to his  imbalance. ° °Today: °Charles [name] was admitted to the hospital 04/21/2021 with profound diabetic ketoacidosis, nausea, vomiting, and dehydration. Just prior to his admittance he had fallen while taking out the trash, and he was not found for a few hours. Since he has returned home his sugars have been up and down. He notes his sugars crashed twice last night, and he presents a log. ° °He is accompanied by his close friend Rhonda. Overall, he is feeling horrible. He has no strength, and has fallen multiple times (without syncope). Currently he is unable to complete ADL's such as showering, dressing, and cooking meals. 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 ° °Lately, he reports that his breathing has been "tough". This is  especially with moving around. He denies orthopnea at night.  ° °Additionally they report he has mild LE edema, and he is suffering from constant pain in his bilateral toes. ° °Physically he is unable to formally exercise. He believes he is scheduled for PT next month. He is trying to continue eating healthy foods. ° °He denies any palpitations, or chest pain. No lightheadedness, headaches, or PND. ° ° °Past Medical History:  °Diagnosis Date  ° Arthritis   ° Diabetes mellitus without complication (HCC)   ° Heart disease   ° Hypertension   ° ° °Past Surgical History:  °Procedure Laterality Date  ° CORONARY ARTERY BYPASS GRAFT    ° ° °Current Medications: °Current Outpatient Medications on File Prior to Visit  °Medication Sig  ° aspirin EC 81 MG tablet Take 81 mg by   mouth daily. Swallow whole.  ° blood glucose meter kit and supplies Dispense based on patient and insurance preference. Use up to four times daily as directed. (FOR ICD-10 E10.9, E11.9).  ° clotrimazole-betamethasone (LOTRISONE) cream Apply topically 2 (two) times daily. Apply to foreskin  ° Continuous Blood Gluc Sensor (FREESTYLE LIBRE 2 SENSOR) MISC 3 Units by Does not apply route 3 (three) times daily with meals.  ° gabapentin (NEURONTIN)  300 MG capsule Take 1 capsule (300 mg total) by mouth at bedtime. (Patient taking differently: Take 300 mg by mouth once a week.)  ° HUMALOG KWIKPEN 100 UNIT/ML KwikPen Inject 3 Units into the skin 3 (three) times daily before meals.  ° Insulin Glargine (BASAGLAR KWIKPEN) 100 UNIT/ML Inject 28 Units into the skin daily.  ° oxybutynin (DITROPAN) 5 MG tablet Take 1 tablet (5 mg total) by mouth every 8 (eight) hours as needed for bladder spasms.  ° pantoprazole (PROTONIX) 40 MG tablet Take 1 tablet (40 mg total) by mouth daily. Any generic PPI is okay to dispense  ° rosuvastatin (CRESTOR) 40 MG tablet Take 1 tablet (40 mg total) by mouth daily.  ° tamsulosin (FLOMAX) 0.4 MG CAPS capsule Take 1 capsule (0.4 mg total) by mouth daily.  ° °No current facility-administered medications on file prior to visit.  °  ° °Allergies:   Patient has no known allergies.  ° °Social History  ° °Tobacco Use  ° Smoking status: Never  ° Smokeless tobacco: Never  °Vaping Use  ° Vaping Use: Never used  °Substance Use Topics  ° Alcohol use: Not Currently  °  Comment: none in 6 months may open a bottle of wine tonight 11/1112022  ° Drug use: Never  ° ° °Family History: °family history includes Diabetes in his father, mother, sister, and sister; Heart attack in his father; Hypertension in his father and mother. ° °ROS:   °Please see the history of present illness. °(+) Generalized weakness °(+) Mechanical falls °(+) Shortness of breath °(+) Constant pain in bilateral toes °(+) Bilateral LE edema °All other systems are reviewed and negative.  ° ° °EKGs/Labs/Other Studies Reviewed:   ° °The following studies were reviewed today: ° °Echo 03/24/2021: °Sonographer Comments: Suboptimal parasternal window and suboptimal  °subcostal window.  °IMPRESSIONS  ° ° 1. Left ventricular ejection fraction, by estimation, is 60 to 65%. The  °left ventricle has normal function. The left ventricle has no regional  °wall motion abnormalities. Left ventricular  diastolic parameters are  °consistent with Grade I diastolic  °dysfunction (impaired relaxation).  ° 2. Right ventricular systolic function is normal. The right ventricular  °size is mildly enlarged.  ° 3. The mitral valve is normal in structure. No evidence of mitral valve  °regurgitation. No evidence of mitral stenosis.  ° 4. The aortic valve is normal in structure. Aortic valve regurgitation is  °not visualized. No aortic stenosis is present.  ° 5. The inferior vena cava is normal in size with greater than 50%  °respiratory variability, suggesting right atrial pressure of 3 mmHg.  ° °EKG:  EKG is personally reviewed.   °05/04/2021: *** °03/22/2021: NSR at 94 bpm, abnormal T wave in inferolateral leads, no prior ° °Recent Labs: °04/22/2021: ALT 25; Magnesium 2.0; TSH 1.884 °04/23/2021: Hemoglobin 12.0; Platelets 143 °04/24/2021: BUN 15; Creatinine, Ser 0.60; Potassium 3.3; Sodium 133  ° °Recent Lipid Panel °   °Component Value Date/Time  ° CHOL 328 (H) 12/23/2020 1140  ° TRIG 251 (H) 12/23/2020 1140  ° HDL 46   46 12/23/2020 1140   CHOLHDL 7.1 (H) 12/23/2020 1140   LDLCALC 231 (H) 12/23/2020 1140    Physical Exam:    VS:  BP 112/62    Pulse 85    Ht 5' 6" (1.676 m)    Wt 124 lb 12.8 oz (56.6 kg)    BMI 20.14 kg/m     Wt Readings from Last 3 Encounters:  05/04/21 124 lb 12.8 oz (56.6 kg)  04/24/21 127 lb 13.9 oz (58 kg)  04/13/21 136 lb 8 oz (61.9 kg)    GEN: Well nourished, well developed in no acute distress HEENT: Normal, moist mucous membranes NECK: No JVD CARDIAC: regular rhythm, normal S1 and S2, no rubs or gallops. No murmur. VASCULAR: Radial and DP pulses 2+ bilaterally. No carotid bruits RESPIRATORY:  Clear to auscultation without rales, wheezing or rhonchi  ABDOMEN: Soft, non-tender, non-distended MUSCULOSKELETAL:  ***In wheelchair, unable to complete ADL's without assistance SKIN: Warm and dry, no edema NEUROLOGIC:  Alert and oriented x 3. No focal neuro deficits noted. PSYCHIATRIC:   Normal affect    ASSESSMENT:    No diagnosis found.  PLAN:    Fatigue, shortness of breath, generalized weakness, dyspnea on exertion -This is very concerning. 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 He walks with a can and sometimes doesn't have the strength to get up from his bed unassisted, sometimes having to soil himself in the bed because he is too  weak to get up -I am very concerned about this. I worry about neurologic or autoimmune/inflammatory diseases. I think he needs a thorough workup. He lives near [facility]. With his limited mobility, he would prefer to follow up there if possible for general medical care -he does not have any edema, JVD, or rales to suggest this is related to heart failure, but will check echo to exclude it -see next steps for CAD, below  History of CAD History of 4V CABG 2004 at [facility] in [place]. Attempted to get records, not available in [place] -did not have cardiologist in [place] but was able to link to medicine notes -has stress tests post CABG but has not had cath -we will start with echo as above. If echo largely normal, will pursue lexiscan nuclear stress test. If echo abnormal, will pursue cath -he denies angina -counseled on red flag warning signs that need immediate medical attention -continue aspirin -he was recently restarted on rosuvastatin 40 mg daily several days ago. 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 Now following with Dr. Chalmers Cater.  -A1c 12/2020 >15.5  Cardiac risk counseling and prevention recommendations: -recommend heart healthy/Mediterranean diet, with whole grains, fruits, vegetable, fish, lean meats, nuts, and olive oil. Limit salt. -recommend moderate walking,  3-5 times/week for 30-50 minutes each session. Aim for at least 150 minutes.week. Goal should be pace of 3 miles/hours, or walking 1.5 miles in 30 minutes -recommend avoidance of tobacco products. Avoid excess alcohol.    Plan for follow up: 3 months or sooner as needed.  ***I am very concerned about him and the amount of decline he has had. He has a high risk medical history, and his current symptoms are complex without clear etiology. He needs a thorough evaluation--we  start the cardiac evaluation, but he may also benefit from autoimmune/inflammatory evaluation, neurology, etc. ° °Bridgette Christopher, MD, PhD, FACC °[place]   [facility]   ° °Medication Adjustments/Labs and Tests Ordered: °Current medicines are reviewed at length with the patient today.  Concerns regarding medicines are outlined above.  ° °No orders of the defined types were placed in this encounter. ° °No orders of the defined types were placed in this encounter. ° °There are no Patient Instructions on file for this visit.  ° °I,Mathew Stumpf,acting as a scribe for Bridgette Christopher, MD.,have documented all relevant documentation on the behalf of Bridgette Christopher, MD,as directed by  Bridgette Christopher, MD while in the presence of Bridgette Christopher, MD. ° °I, Mathew Stumpf, have reviewed all documentation for this visit. The documentation on 05/04/21 for the exam, diagnosis, procedures, and orders are all accurate and complete.  ° °Signed, °Bridgette Christopher, MD PhD °05/04/2021 9:15 AM    °[facility] ° °

## 2021-05-04 NOTE — Patient Instructions (Signed)
Medication Instructions:  No change *If you need a refill on your cardiac medications before your next appointment, please call your pharmacy*   Lab Work: None today If you have labs (blood work) drawn today and your tests are completely normal, you will receive your results only by: MyChart Message (if you have MyChart) OR A paper copy in the mail If you have any lab test that is abnormal or we need to change your treatment, we will call you to review the results.   Testing/Procedures: None   Follow-Up: At [facility], you and your health needs are our priority.  As part of our continuing mission to provide you with exceptional heart care, we have created designated Provider Care Teams.  These Care Teams include your primary Cardiologist (physician) and Advanced Practice Providers (APPs -  Physician Assistants and Nurse Practitioners) who all work together to provide you with the care you need, when you need it.  We recommend signing up for the patient portal called "MyChart".  Sign up information is provided on this After Visit Summary.  MyChart is used to connect with patients for Virtual Visits (Telemedicine).  Patients are able to view lab/test results, encounter notes, upcoming appointments, etc.  Non-urgent messages can be sent to your provider as well.   To learn more about what you can do with MyChart, go to [email or web address].    Your next appointment:   3 month(s)  The format for your next appointment:   In Person  Provider:   Jodelle Red, MD    Other Instructions I will reach out to Dr. Patsy Lager and we will discuss how to get home services.

## 2021-05-05 ENCOUNTER — Ambulatory Visit (INDEPENDENT_AMBULATORY_CARE_PROVIDER_SITE_OTHER): Payer: Managed Care, Other (non HMO) | Admitting: Family Medicine

## 2021-05-05 VITALS — BP 100/60 | HR 87 | Resp 18 | Wt 127.8 lb

## 2021-05-05 DIAGNOSIS — Z09 Encounter for follow-up examination after completed treatment for conditions other than malignant neoplasm: Secondary | ICD-10-CM | POA: Diagnosis not present

## 2021-05-05 DIAGNOSIS — E1110 Type 2 diabetes mellitus with ketoacidosis without coma: Secondary | ICD-10-CM

## 2021-05-05 DIAGNOSIS — G709 Myoneural disorder, unspecified: Secondary | ICD-10-CM

## 2021-05-05 DIAGNOSIS — R531 Weakness: Secondary | ICD-10-CM

## 2021-05-05 DIAGNOSIS — Z978 Presence of other specified devices: Secondary | ICD-10-CM

## 2021-05-05 NOTE — Patient Instructions (Addendum)
It was good to see you today- I am sorry you got so sick  I will be in touch with your labs asap  Please call Dr Talmage Nap today and set up your next appt- let them know you were in the hospital  Address: [address], [place], [place] [address] Phone: [phone]  Dr Laverle Hobby urology office is in [place] -new location Monday at 10am with [place] their PA  Address: [address] [place], [place] [address] Phone: [phone]

## 2021-05-06 ENCOUNTER — Encounter: Payer: Self-pay | Admitting: Family Medicine

## 2021-05-06 LAB — BASIC METABOLIC PANEL
BUN: 7 mg/dL (ref 6–23)
CO2: 30 mEq/L (ref 19–32)
Calcium: 8.9 mg/dL (ref 8.4–10.5)
Chloride: 102 mEq/L (ref 96–112)
Creatinine, Ser: 0.75 mg/dL (ref 0.40–1.50)
GFR: 99.64 mL/min (ref >60.00)
Glucose, Bld: 96 mg/dL (ref 70–99)
Potassium: 3.8 mEq/L (ref 3.5–5.1)
Sodium: 138 mEq/L (ref 135–145)

## 2021-05-09 ENCOUNTER — Other Ambulatory Visit: Payer: Self-pay

## 2021-05-09 ENCOUNTER — Ambulatory Visit: Payer: Managed Care, Other (non HMO) | Admitting: Urology

## 2021-05-09 VITALS — BP 139/79 | HR 97 | Wt 129.8 lb

## 2021-05-09 DIAGNOSIS — N138 Other obstructive and reflux uropathy: Secondary | ICD-10-CM

## 2021-05-09 DIAGNOSIS — R339 Retention of urine, unspecified: Secondary | ICD-10-CM

## 2021-05-09 DIAGNOSIS — N401 Enlarged prostate with lower urinary tract symptoms: Secondary | ICD-10-CM

## 2021-05-09 LAB — MICROSCOPIC EXAMINATION
Renal Epithel, UA: NONE SEEN /hpf
WBC, UA: >30 /hpf — AB (ref 0–5)

## 2021-05-09 LAB — URINALYSIS, ROUTINE W REFLEX MICROSCOPIC
Bilirubin, UA: NEGATIVE
Ketones, UA: NEGATIVE
Nitrite, UA: NEGATIVE
Specific Gravity, UA: 1.025 (ref 1.005–1.030)
Urobilinogen, Ur: 0.2 mg/dL (ref 0.2–1.0)
pH, UA: 6 (ref 5.0–7.5)

## 2021-05-09 MED ORDER — TAMSULOSIN HCL 0.4 MG PO CAPS: 0.4000 mg | ORAL_CAPSULE | Freq: Two times a day (BID) | ORAL | 11 refills | Status: DC

## 2021-05-09 NOTE — Progress Notes (Signed)
05/09/2021 10:40 AM   Alda Lea 1960-09-12 [id number]  Referring provider: Darreld Mclean, [address] [place],  [place] [address]  Urinary retention   HPI: Mr Hoogendoorn is a 61yo here for evaluation of urinary retention. The patient developed urinary retention during his hospitalization for DKA and had a foley placed at that time. He was started on flomax 0.$RemoveBef'4mg'WJycpZbXlz$  daily. Prior to hospitalization he had mild LUTS on no BPH therapy. He had nocturia 1-2x, urine stream was fair. No straining to urinate. He failed his voiding trial today.    PMH: Past Medical History:  Diagnosis Date   Arthritis    Diabetes mellitus without complication ([facility])    Heart disease    Hypertension     Surgical History: Past Surgical History:  Procedure Laterality Date   CORONARY ARTERY BYPASS GRAFT      Home Medications:  Allergies as of 05/09/2021   No Known Allergies      Medication List        Accurate as of May 09, 2021 10:40 AM. If you have any questions, ask your nurse or doctor.          aspirin EC 81 MG tablet Take 81 mg by mouth daily. Swallow whole.   Basaglar KwikPen 100 UNIT/ML Inject 28 Units into the skin daily.   blood glucose meter kit and supplies Dispense based on patient and insurance preference. Use up to four times daily as directed. (FOR ICD-10 E10.9, E11.9).   clotrimazole-betamethasone cream Commonly known as: LOTRISONE Apply topically 2 (two) times daily. Apply to foreskin   FreeStyle Libre 2 Sensor Misc 3 Units by Does not apply route 3 (three) times daily with meals.   gabapentin 300 MG capsule Commonly known as: Neurontin Take 1 capsule (300 mg total) by mouth at bedtime. What changed: when to take this   HumaLOG KwikPen 100 UNIT/ML KwikPen Generic drug: insulin lispro Inject 3 Units into the skin 3 (three) times daily before meals.   oxybutynin 5 MG tablet Commonly known as: DITROPAN Take 1 tablet (5 mg total) by  mouth every 8 (eight) hours as needed for bladder spasms.   pantoprazole 40 MG tablet Commonly known as: PROTONIX Take 1 tablet (40 mg total) by mouth daily. Any generic PPI is okay to dispense   rosuvastatin 40 MG tablet Commonly known as: CRESTOR Take 1 tablet (40 mg total) by mouth daily.   tamsulosin 0.4 MG Caps capsule Commonly known as: FLOMAX Take 1 capsule (0.4 mg total) by mouth daily.        Allergies: No Known Allergies  Family History: Family History  Problem Relation Age of Onset   Diabetes Mother    Hypertension Mother    Diabetes Father    Hypertension Father    Heart attack Father    Diabetes Sister    Diabetes Sister     Social History:  reports that he has never smoked. He has never used smokeless tobacco. He reports that he does not currently use alcohol. He reports that he does not use drugs.  ROS: All other review of systems were reviewed and are negative except what is noted above in HPI  Physical Exam: BP 139/79    Pulse 97    Wt 129 lb 12.8 oz (58.9 kg)    BMI 20.95 kg/m   Constitutional:  Alert and oriented, No acute distress. HEENT: [place] AT, moist mucus membranes.  Trachea midline, no masses. Cardiovascular: No clubbing,  cyanosis, or edema. Respiratory: Normal respiratory effort, no increased work of breathing. GI: Abdomen is soft, nontender, nondistended, no abdominal masses GU: No CVA tenderness.  Lymph: No cervical or inguinal lymphadenopathy. Skin: No rashes, bruises or suspicious lesions. Neurologic: Grossly intact, no focal deficits, moving all 4 extremities. Psychiatric: Normal mood and affect.  Laboratory Data: Lab Results  Component Value Date   WBC 8.9 04/23/2021   HGB 12.0 (L) 04/23/2021   HCT 32.0 (L) 04/23/2021   MCV 84.0 04/23/2021   PLT 143 (L) 04/23/2021    Lab Results  Component Value Date   CREATININE 0.75 05/05/2021    No results found for: PSA  No results found for: TESTOSTERONE  Lab Results  Component  Value Date   HGBA1C 10.4 (H) 04/22/2021    Urinalysis    Component Value Date/Time   COLORURINE YELLOW 04/22/2021 1200   APPEARANCEUR CLEAR 04/22/2021 1200   APPEARANCEUR Clear 02/08/2021 0923   LABSPEC 1.023 04/22/2021 1200   PHURINE 5.0 04/22/2021 1200   GLUCOSEU >=500 (A) 04/22/2021 1200   HGBUR NEGATIVE 04/22/2021 1200   BILIRUBINUR NEGATIVE 04/22/2021 1200   BILIRUBINUR Negative 02/08/2021 0923   KETONESUR 80 (A) 04/22/2021 1200   PROTEINUR NEGATIVE 04/22/2021 1200   NITRITE NEGATIVE 04/22/2021 1200   LEUKOCYTESUR NEGATIVE 04/22/2021 1200    Lab Results  Component Value Date   LABMICR Comment 02/08/2021   BACTERIA RARE (A) 04/22/2021    Pertinent Imaging:  No results found for this or any previous visit.  No results found for this or any previous visit.  No results found for this or any previous visit.  No results found for this or any previous visit.  No results found for this or any previous visit.  No results found for this or any previous visit.  No results found for this or any previous visit.  No results found for this or any previous visit.   Assessment & Plan:    1. BPH with obstruction/lower urinary tract symptoms -flomax 0.4mg  BID - Urinalysis, Routine w reflex microscopic  2. Urinary retention -Flomax 0.4mg  BID  No follow-ups on file.  Nicolette Bang, MD  [facility] [place]

## 2021-05-09 NOTE — Patient Instructions (Signed)
Acute Urinary Retention, Male ?Acute urinary retention is a condition in which a person is unable to pass urine or can only pass a little urine. This condition can happen suddenly and last for a short time. If left untreated, it can become long-term (chronic) and result in kidney damage or other serious complications. ?What are the causes? ?This condition may be caused by: ?Obstruction or narrowing of the tube that drains the bladder (urethra). This may be caused by surgery, problems with nearby organs, or injury to the bladder or urethra. ?Problems with the nerves in the bladder. ?Tumors in the area of the pelvis, bladder, or urethra. ?Certain medicines. ?Bladder or urinary tract infection. ?Constipation. ?What increases the risk? ?This condition is more likely to develop in older men. As men age, their prostate may become larger and may start to press or squeeze on the bladder or the urethra. Other chronic health conditions can increase the risk of acute urinary retention. These include: ?Diseases such as multiple sclerosis. ?Spinal cord injuries. ?Diabetes. ?Degenerative cognitive conditions, such as delirium or dementia. ?Psychological conditions. A man may hold his urine due to trauma or because he does not want to use the bathroom. ?What are the signs or symptoms? ?Symptoms of this condition include: ?Trouble urinating. ?Pain in the lower abdomen. ?How is this diagnosed? ?This condition is diagnosed based on a physical exam and your medical history. You may also have other tests, including: ?An ultrasound of the bladder or kidneys or both. ?Blood tests. ?A urine analysis. ?Additional tests may be needed, such as a CT scan, MRI, and kidney or bladder function tests. ?How is this treated? ?Treatment for this condition may include: ?Medicines. ?Placing a thin, sterile tube (catheter) into the bladder to drain urine out of the body. This is called an indwelling urinary catheter. After it is inserted, the catheter  is held in place with a small balloon that is filled with sterile water. Urine drains from the catheter into a collection bag outside of the body. ?Behavioral therapy. ?Treatment for other conditions. ?If needed, you may be treated in the hospital for kidney function problems or to manage other complications. ?Follow these instructions at home: ?Medicines ?Take over-the-counter and prescription medicines only as told by your health care provider. Avoid certain medicines, such as decongestants, antihistamines, and some prescription medicines. Do not take any medicine unless your health care provider approves. ?If you were prescribed an antibiotic medicine, take it as told by your health care provider. Do not stop using the antibiotic even if you start to feel better. ?General instructions ?Do not use any products that contain nicotine or tobacco. These products include cigarettes, chewing tobacco, and vaping devices, such as e-cigarettes. If you need help quitting, ask your health care provider. ?Drink enough fluid to keep your urine pale yellow. ?If you have an indwelling urinary catheter, follow the instructions from your health care provider. ?Monitor any changes in your symptoms. Tell your health care provider about any changes. ?If instructed, monitor your blood pressure at home. Report changes as told by your health care provider. ?Keep all follow-up visits. This is important. ?Contact a health care provider if: ?You have uncomfortable bladder contractions that you cannot control (spasms). ?You leak urine with the spasms. ?Get help right away if: ?You have chills or a fever. ?You have blood in your urine. ?You have a catheter and the following happens: ?Your catheter stops draining urine. ?Your catheter falls out. ?Summary ?Acute urinary retention is a condition in which   a person is unable to pass urine or can only pass a little urine. If left untreated, this condition can result in kidney damage or other  serious complications. ?An enlarged prostate may cause this condition. As men age, their prostate gland may become larger and may press or squeeze on the bladder or the urethra. ?Treatment for this condition may include medicines and placement of an indwelling urinary catheter. ?Monitor any changes in your symptoms. Tell your health care provider about any changes. ?This information is not intended to replace advice given to you by your health care provider. Make sure you discuss any questions you have with your health care provider. ?Document Revised: 11/19/2019 Document Reviewed: 11/19/2019 ?Elsevier Patient Education ? 2022 Elsevier Inc[employer] ? ?

## 2021-05-09 NOTE — Progress Notes (Signed)
Fill and Pull Catheter Removal  Patient is present today for a catheter removal.  Patient was cleaned and prepped in a sterile fashion 352ml of sterile water/ saline was instilled into the bladder when the patient felt the urge to urinate. 60ml of water was then drained from the balloon.  A 16FR foley cath was removed from the bladder no complications were noted .  Patient as then given some time to void on their own.  Patient cannot void  60ml on their own after some time.  Patient tolerated well.  Performed by: Estill Bamberg RN  Follow up/ Additional notes: Simple Catheter Placement  Due to urinary retention patient is present today for a foley cath placement.  Patient was cleaned and prepped in a sterile fashion with betadine. A 16 FR foley catheter was inserted, urine return was noted  347ml, urine was yellow in color.  The balloon was filled with 10cc of sterile water.  A leg bag was attached for drainage. Patient was also given a night bag to take home and was given instruction on how to change from one bag to another.  Patient was given instruction on proper catheter care.  Patient tolerated well, no complications were noted   Performed by: Estill Bamberg RN  Additional notes/ Follow up: to see PA in 1 week voiding trial

## 2021-05-12 ENCOUNTER — Ambulatory Visit: Payer: Managed Care, Other (non HMO) | Admitting: Physical Therapy

## 2021-05-14 ENCOUNTER — Encounter: Payer: Self-pay | Admitting: Urology

## 2021-05-15 ENCOUNTER — Other Ambulatory Visit (HOSPITAL_BASED_OUTPATIENT_CLINIC_OR_DEPARTMENT_OTHER): Payer: Self-pay | Admitting: Family Medicine

## 2021-05-16 ENCOUNTER — Ambulatory Visit: Payer: Managed Care, Other (non HMO) | Admitting: Physical Therapy

## 2021-05-19 ENCOUNTER — Ambulatory Visit: Payer: Managed Care, Other (non HMO) | Admitting: Urology

## 2021-05-19 ENCOUNTER — Encounter: Payer: Managed Care, Other (non HMO) | Admitting: Physical Therapy

## 2021-05-19 ENCOUNTER — Other Ambulatory Visit: Payer: Self-pay

## 2021-05-19 ENCOUNTER — Encounter: Payer: Self-pay | Admitting: Urology

## 2021-05-19 VITALS — BP 114/72 | HR 72

## 2021-05-19 DIAGNOSIS — N401 Enlarged prostate with lower urinary tract symptoms: Secondary | ICD-10-CM

## 2021-05-19 DIAGNOSIS — N138 Other obstructive and reflux uropathy: Secondary | ICD-10-CM

## 2021-05-19 DIAGNOSIS — R339 Retention of urine, unspecified: Secondary | ICD-10-CM | POA: Diagnosis not present

## 2021-05-19 MED ORDER — SILODOSIN 8 MG PO CAPS: 8.0000 mg | ORAL_CAPSULE | Freq: Every day | ORAL | 11 refills | Status: DC

## 2021-05-19 NOTE — Progress Notes (Signed)
? ?  Assessment: ?1. Urinary retention   ?2. BPH with obstruction/lower urinary tract symptoms   ? ? ?Plan: ?Foley replaced today as patient was unable to void. ?Cipro x1 given today. ?Will change to Rapaflo 8 mg daily in place of tamsulosin. ?Return to office in 7-10 days with Dr. Ronne Binning for cystoscopy and voiding trial. ? ?Chief Complaint: ?Chief Complaint  ?Patient presents with  ? Urinary Retention  ? ? ?HPI: ?Charles [name] is a 61 y.o. male who presents for continued evaluation of urinary retention.  He developed urinary retention during his hospitalization for DKA and had a Foley catheter placed at that time.  He was started on Flomax 0.4 mg daily.  Prior to his hospitalization, he had mild lower urinary tract symptoms on no BPH therapy.  He reported nocturia 1-2 times and a good stream.  He underwent a voiding trial on 05/09/2020 but was unable to void and the Foley catheter was replaced.  His Flomax was increased to twice daily.  He returns today for a voiding trial. ? ?Portions of the above documentation were copied from a prior visit for review purposes only. ? ?Allergies: ?No Known Allergies ? ?PMH: ?Past Medical History:  ?Diagnosis Date  ? Arthritis   ? Diabetes mellitus without complication ([facility])   ? Heart disease   ? Hypertension   ? ? ?PSH: ?Past Surgical History:  ?Procedure Laterality Date  ? CORONARY ARTERY BYPASS GRAFT    ? ? ?SH: ?Social History  ? ?Tobacco Use  ? Smoking status: Never  ? Smokeless tobacco: Never  ?Vaping Use  ? Vaping Use: Never used  ?Substance Use Topics  ? Alcohol use: Not Currently  ?  Comment: none in 6 months may open a bottle of wine tonight 11/1112022  ? Drug use: Never  ? ? ?ROS: ?Constitutional:  Negative for fever, chills, weight loss ?CV: Negative for chest pain, previous MI, hypertension ?Respiratory:  Negative for shortness of breath, wheezing, sleep apnea, frequent cough ?GI:  Negative for nausea, vomiting, bloody stool, GERD ? ?PE: ?BP 114/72   Pulse 72   ?GENERAL APPEARANCE:  Well appearing, well developed, well nourished, NAD ?HEENT:  Atraumatic, normocephalic, oropharynx clear ?NECK:  Supple without lymphadenopathy or thyromegaly ?ABDOMEN:  Soft, non-tender, no masses ?EXTREMITIES:  Moves all extremities well, without clubbing, cyanosis, or edema ?NEUROLOGIC:  Alert and oriented x 3, normal gait, CN II-XII grossly intact ?MENTAL STATUS:  appropriate ?BACK:  Non-tender to palpation, No CVAT ?SKIN:  Warm, dry, and intact ? ? ?Results: ?None ? ?Voiding trial: ?Volume instilled: 700 ml ?Volume voided:  0 ml ? ?

## 2021-05-19 NOTE — Progress Notes (Signed)
Fill and Pull Catheter Removal ? ?Patient is present today for a catheter removal.  Patient was cleaned and prepped in a sterile fashion of sterile water/ saline was instilled into the bladder when the patient felt the urge to urinate. 69ml of water was then drained from the balloon.  A 16FR foley cath was removed from the bladder no complications were noted .  Patient as then given some time to void on their own.  Patient cannot void  67ml on their own after some time.  Patient tolerated well. ? ?Performed by: Marchelle Folks RN ? ?Follow up/ Additional notes: MD to see  ? ? ?Simple Catheter Placement ? ?Due to urinary retention patient is present today for a foley cath placement.  Patient was cleaned and prepped in a sterile fashion with betadine. A 16 coude FR foley catheter was inserted, urine return was noted  , urine was pale yellow in color.  The balloon was filled with 10cc of sterile water.  A leg bag was attached for drainage. Patient was also given a night bag to take home and was given instruction on how to change from one bag to another.  Patient was given instruction on proper catheter care.  Patient tolerated well, no complications were noted  ? ?Performed by: Marchelle Folks RN ? ?Additional notes/ Follow up: see MD note  ?

## 2021-05-23 ENCOUNTER — Encounter: Payer: Managed Care, Other (non HMO) | Admitting: Physical Therapy

## 2021-05-26 ENCOUNTER — Encounter: Payer: Managed Care, Other (non HMO) | Admitting: Physical Therapy

## 2021-05-27 ENCOUNTER — Telehealth: Payer: Self-pay | Admitting: Family Medicine

## 2021-05-27 ENCOUNTER — Telehealth: Payer: Self-pay

## 2021-05-27 ENCOUNTER — Ambulatory Visit: Payer: Managed Care, Other (non HMO)

## 2021-05-27 NOTE — Telephone Encounter (Signed)
Patient called advising he contacted Emergency Services due to catheter coming out. Once EMS arrived on scene and spoke with the patient they advised him to contact his urologist. Patient contacted our office and we advised we would speak with the doctor to confirm an appt time and call him back. Patient voiced understanding . Once appt time confirmed for today with provider we attempted to contact  patient leaving two voicemail's to confirm patient would come to appt since the matter was rather urgent.  ?

## 2021-05-27 NOTE — Telephone Encounter (Signed)
Patient called asking if he can be seen today due to cath came out. He wants it put back in. Can he be seen today? ? ?Call back:  [phone] ? ?Thanks, ?Rosey Bath ?

## 2021-05-27 NOTE — Telephone Encounter (Signed)
Left vm to return call.    

## 2021-05-27 NOTE — Telephone Encounter (Signed)
Returned call to patient. No answer. Left message.

## 2021-05-27 NOTE — Telephone Encounter (Signed)
Pt would like to discuss HH that was suppose to be provided by provide  ? ?Please advise  ?

## 2021-05-30 ENCOUNTER — Encounter: Payer: Self-pay | Admitting: Urology

## 2021-05-30 ENCOUNTER — Other Ambulatory Visit: Payer: Self-pay

## 2021-05-30 ENCOUNTER — Ambulatory Visit (INDEPENDENT_AMBULATORY_CARE_PROVIDER_SITE_OTHER): Payer: Managed Care, Other (non HMO) | Admitting: Urology

## 2021-05-30 VITALS — BP 122/78 | HR 103

## 2021-05-30 DIAGNOSIS — N401 Enlarged prostate with lower urinary tract symptoms: Secondary | ICD-10-CM

## 2021-05-30 DIAGNOSIS — R339 Retention of urine, unspecified: Secondary | ICD-10-CM

## 2021-05-30 DIAGNOSIS — N138 Other obstructive and reflux uropathy: Secondary | ICD-10-CM

## 2021-05-30 MED ORDER — CIPROFLOXACIN HCL 500 MG PO TABS
500.0000 mg | ORAL_TABLET | Freq: Once | ORAL | Status: AC
  Administered 2021-05-30: 500 mg via ORAL

## 2021-05-30 NOTE — Patient Instructions (Signed)
Acute Urinary Retention, Male ?Acute urinary retention is a condition in which a person is unable to pass urine or can only pass a little urine. This condition can happen suddenly and last for a short time. If left untreated, it can become long-term (chronic) and result in kidney damage or other serious complications. ?What are the causes? ?This condition may be caused by: ?Obstruction or narrowing of the tube that drains the bladder (urethra). This may be caused by surgery, problems with nearby organs, or injury to the bladder or urethra. ?Problems with the nerves in the bladder. ?Tumors in the area of the pelvis, bladder, or urethra. ?Certain medicines. ?Bladder or urinary tract infection. ?Constipation. ?What increases the risk? ?This condition is more likely to develop in older men. As men age, their prostate may become larger and may start to press or squeeze on the bladder or the urethra. Other chronic health conditions can increase the risk of acute urinary retention. These include: ?Diseases such as multiple sclerosis. ?Spinal cord injuries. ?Diabetes. ?Degenerative cognitive conditions, such as delirium or dementia. ?Psychological conditions. A man may hold his urine due to trauma or because he does not want to use the bathroom. ?What are the signs or symptoms? ?Symptoms of this condition include: ?Trouble urinating. ?Pain in the lower abdomen. ?How is this diagnosed? ?This condition is diagnosed based on a physical exam and your medical history. You may also have other tests, including: ?An ultrasound of the bladder or kidneys or both. ?Blood tests. ?A urine analysis. ?Additional tests may be needed, such as a CT scan, MRI, and kidney or bladder function tests. ?How is this treated? ?Treatment for this condition may include: ?Medicines. ?Placing a thin, sterile tube (catheter) into the bladder to drain urine out of the body. This is called an indwelling urinary catheter. After it is inserted, the catheter  is held in place with a small balloon that is filled with sterile water. Urine drains from the catheter into a collection bag outside of the body. ?Behavioral therapy. ?Treatment for other conditions. ?If needed, you may be treated in the hospital for kidney function problems or to manage other complications. ?Follow these instructions at home: ?Medicines ?Take over-the-counter and prescription medicines only as told by your health care provider. Avoid certain medicines, such as decongestants, antihistamines, and some prescription medicines. Do not take any medicine unless your health care provider approves. ?If you were prescribed an antibiotic medicine, take it as told by your health care provider. Do not stop using the antibiotic even if you start to feel better. ?General instructions ?Do not use any products that contain nicotine or tobacco. These products include cigarettes, chewing tobacco, and vaping devices, such as e-cigarettes. If you need help quitting, ask your health care provider. ?Drink enough fluid to keep your urine pale yellow. ?If you have an indwelling urinary catheter, follow the instructions from your health care provider. ?Monitor any changes in your symptoms. Tell your health care provider about any changes. ?If instructed, monitor your blood pressure at home. Report changes as told by your health care provider. ?Keep all follow-up visits. This is important. ?Contact a health care provider if: ?You have uncomfortable bladder contractions that you cannot control (spasms). ?You leak urine with the spasms. ?Get help right away if: ?You have chills or a fever. ?You have blood in your urine. ?You have a catheter and the following happens: ?Your catheter stops draining urine. ?Your catheter falls out. ?Summary ?Acute urinary retention is a condition in which   a person is unable to pass urine or can only pass a little urine. If left untreated, this condition can result in kidney damage or other  serious complications. ?An enlarged prostate may cause this condition. As men age, their prostate gland may become larger and may press or squeeze on the bladder or the urethra. ?Treatment for this condition may include medicines and placement of an indwelling urinary catheter. ?Monitor any changes in your symptoms. Tell your health care provider about any changes. ?This information is not intended to replace advice given to you by your health care provider. Make sure you discuss any questions you have with your health care provider. ?Document Revised: 11/19/2019 Document Reviewed: 11/19/2019 ?Elsevier Patient Education ? 2022 Elsevier Inc[employer] ? ?

## 2021-05-30 NOTE — Progress Notes (Signed)
? ?  05/30/21 ? ?CC: urinary retention ? ? ?HPI: ?Mr Galindo is a 61yo here for followup for urinary retention. He has failed multiple voiding trials.  ?Blood pressure 122/78, pulse (!) 103. ?NED. A&Ox3.   ?No respiratory distress   ?Abd soft, NT, ND ?Normal phallus with bilateral descended testicles ? ?Cystoscopy Procedure Note ? ?Patient identification was confirmed, informed consent was obtained, and patient was prepped using Betadine solution.  Lidocaine jelly was administered per urethral meatus.   ? ? ?Pre-Procedure: ?- Inspection reveals a normal caliber ureteral meatus. ? ?Procedure: ?The flexible cystoscope was introduced without difficulty ?- No urethral strictures/lesions are present. ?- Enlarged prostate mild hyperplasia. No median lobe ?- Normal bladder neck ?- Bilateral ureteral orifices identified ?- Bladder mucosa  reveals no ulcers, tumors, or lesions ?- No bladder stones ?- No trabeculation ? ?Retroflexion shows no intravesical prostatic protrusion ? ? ?Post-Procedure: ?- Patient tolerated the procedure well ? ?Assessment/ Plan: ?Schedule for Urodynamics due to mild lateral lobe obstruction and hx of poorly controlled DMII ? ?Wilkie Aye, MD  ?

## 2021-06-02 ENCOUNTER — Encounter: Payer: Managed Care, Other (non HMO) | Admitting: Physical Therapy

## 2021-06-04 ENCOUNTER — Emergency Department (HOSPITAL_COMMUNITY): Payer: Commercial Managed Care - HMO

## 2021-06-04 ENCOUNTER — Encounter (HOSPITAL_COMMUNITY): Payer: Self-pay | Admitting: Internal Medicine

## 2021-06-04 ENCOUNTER — Other Ambulatory Visit: Payer: Self-pay

## 2021-06-04 ENCOUNTER — Observation Stay (HOSPITAL_COMMUNITY)
Admission: EM | Admit: 2021-06-04 | Discharge: 2021-06-06 | Disposition: A | Discharge status: Home Health | Payer: Commercial Managed Care - HMO | Attending: Internal Medicine | Admitting: Internal Medicine

## 2021-06-04 DIAGNOSIS — Z7984 Long term (current) use of oral hypoglycemic drugs: Secondary | ICD-10-CM | POA: Diagnosis not present

## 2021-06-04 DIAGNOSIS — R413 Other amnesia: Secondary | ICD-10-CM

## 2021-06-04 DIAGNOSIS — Z951 Presence of aortocoronary bypass graft: Secondary | ICD-10-CM

## 2021-06-04 DIAGNOSIS — N138 Other obstructive and reflux uropathy: Secondary | ICD-10-CM | POA: Diagnosis present

## 2021-06-04 DIAGNOSIS — E11649 Type 2 diabetes mellitus with hypoglycemia without coma: Secondary | ICD-10-CM | POA: Diagnosis not present

## 2021-06-04 DIAGNOSIS — Z7982 Long term (current) use of aspirin: Secondary | ICD-10-CM | POA: Insufficient documentation

## 2021-06-04 DIAGNOSIS — I10 Essential (primary) hypertension: Secondary | ICD-10-CM | POA: Diagnosis not present

## 2021-06-04 DIAGNOSIS — G9341 Metabolic encephalopathy: Secondary | ICD-10-CM

## 2021-06-04 DIAGNOSIS — N401 Enlarged prostate with lower urinary tract symptoms: Secondary | ICD-10-CM | POA: Diagnosis not present

## 2021-06-04 DIAGNOSIS — Z79899 Other long term (current) drug therapy: Secondary | ICD-10-CM | POA: Diagnosis not present

## 2021-06-04 DIAGNOSIS — N400 Enlarged prostate without lower urinary tract symptoms: Secondary | ICD-10-CM | POA: Diagnosis present

## 2021-06-04 DIAGNOSIS — E785 Hyperlipidemia, unspecified: Secondary | ICD-10-CM | POA: Diagnosis not present

## 2021-06-04 DIAGNOSIS — E876 Hypokalemia: Secondary | ICD-10-CM | POA: Diagnosis not present

## 2021-06-04 DIAGNOSIS — E119 Type 2 diabetes mellitus without complications: Secondary | ICD-10-CM

## 2021-06-04 DIAGNOSIS — Z794 Long term (current) use of insulin: Secondary | ICD-10-CM | POA: Insufficient documentation

## 2021-06-04 DIAGNOSIS — I2510 Atherosclerotic heart disease of native coronary artery without angina pectoris: Secondary | ICD-10-CM | POA: Diagnosis not present

## 2021-06-04 DIAGNOSIS — E162 Hypoglycemia, unspecified: Secondary | ICD-10-CM

## 2021-06-04 DIAGNOSIS — R55 Syncope and collapse: Secondary | ICD-10-CM | POA: Diagnosis present

## 2021-06-04 DIAGNOSIS — G9340 Encephalopathy, unspecified: Secondary | ICD-10-CM | POA: Diagnosis not present

## 2021-06-04 DIAGNOSIS — E118 Type 2 diabetes mellitus with unspecified complications: Secondary | ICD-10-CM

## 2021-06-04 DIAGNOSIS — R4189 Other symptoms and signs involving cognitive functions and awareness: Secondary | ICD-10-CM

## 2021-06-04 LAB — COMPREHENSIVE METABOLIC PANEL
ALT: 33 U/L (ref 0–44)
AST: 31 U/L (ref 15–41)
Albumin: 2.6 g/dL — ABNORMAL LOW (ref 3.5–5.0)
Alkaline Phosphatase: 57 U/L (ref 38–126)
Anion gap: 6 (ref 5–15)
BUN: 8 mg/dL (ref 6–20)
CO2: 25 mmol/L (ref 22–32)
Calcium: 8.5 mg/dL — ABNORMAL LOW (ref 8.9–10.3)
Chloride: 111 mmol/L (ref 98–111)
Creatinine, Ser: 0.53 mg/dL — ABNORMAL LOW (ref 0.61–1.24)
GFR, Estimated: >60 mL/min (ref >60)
Glucose, Bld: 26 mg/dL — CL (ref 70–99)
Potassium: 2.8 mmol/L — ABNORMAL LOW (ref 3.5–5.1)
Sodium: 142 mmol/L (ref 135–145)
Total Bilirubin: 0.6 mg/dL (ref 0.3–1.2)
Total Protein: 5.1 g/dL — ABNORMAL LOW (ref 6.5–8.1)

## 2021-06-04 LAB — MAGNESIUM: Magnesium: 1.3 mg/dL — ABNORMAL LOW (ref 1.7–2.4)

## 2021-06-04 LAB — I-STAT VENOUS BLOOD GAS, ED
Acid-Base Excess: 0 mmol/L (ref 0.0–2.0)
Bicarbonate: 24.4 mmol/L (ref 20.0–28.0)
Calcium, Ion: 1.14 mmol/L — ABNORMAL LOW (ref 1.15–1.40)
HCT: 28 % — ABNORMAL LOW (ref 39.0–52.0)
Hemoglobin: 9.5 g/dL — ABNORMAL LOW (ref 13.0–17.0)
O2 Saturation: 100 %
Potassium: 2.8 mmol/L — ABNORMAL LOW (ref 3.5–5.1)
Sodium: 137 mmol/L (ref 135–145)
TCO2: 25 mmol/L (ref 22–32)
pCO2, Ven: 36.3 mmHg — ABNORMAL LOW (ref 44–60)
pH, Ven: 7.435 — ABNORMAL HIGH (ref 7.25–7.43)
pO2, Ven: 165 mmHg — ABNORMAL HIGH (ref 32–45)

## 2021-06-04 LAB — CBG MONITORING, ED
Glucose-Capillary: 23 mg/dL — CL (ref 70–99)
Glucose-Capillary: 271 mg/dL — ABNORMAL HIGH (ref 70–99)
Glucose-Capillary: 189 mg/dL — ABNORMAL HIGH (ref 70–99)
Glucose-Capillary: 133 mg/dL — ABNORMAL HIGH (ref 70–99)

## 2021-06-04 LAB — URINALYSIS, ROUTINE W REFLEX MICROSCOPIC
Bacteria, UA: NONE SEEN
Bilirubin Urine: NEGATIVE
Glucose, UA: 150 mg/dL — AB
Ketones, ur: NEGATIVE mg/dL
Nitrite: NEGATIVE
Protein, ur: NEGATIVE mg/dL
RBC / HPF: >50 RBC/hpf — ABNORMAL HIGH (ref 0–5)
Specific Gravity, Urine: 1.008 (ref 1.005–1.030)
pH: 5 (ref 5.0–8.0)

## 2021-06-04 LAB — CBC WITH DIFFERENTIAL/PLATELET
Abs Immature Granulocytes: 0.03 10*3/uL (ref 0.00–0.07)
Basophils Absolute: 0 10*3/uL (ref 0.0–0.1)
Basophils Relative: 0 %
Eosinophils Absolute: 0 10*3/uL (ref 0.0–0.5)
Eosinophils Relative: 1 %
HCT: 31.4 % — ABNORMAL LOW (ref 39.0–52.0)
Hemoglobin: 11.1 g/dL — ABNORMAL LOW (ref 13.0–17.0)
Immature Granulocytes: 1 %
Lymphocytes Relative: 11 %
Lymphs Abs: 0.6 10*3/uL — ABNORMAL LOW (ref 0.7–4.0)
MCH: 30.6 pg (ref 26.0–34.0)
MCHC: 35.4 g/dL (ref 30.0–36.0)
MCV: 86.5 fL (ref 80.0–100.0)
Monocytes Absolute: 0.4 10*3/uL (ref 0.1–1.0)
Monocytes Relative: 7 %
Neutro Abs: 4.2 10*3/uL (ref 1.7–7.7)
Neutrophils Relative %: 80 %
Platelets: UNDETERMINED 10*3/uL (ref 150–400)
RBC: 3.63 MIL/uL — ABNORMAL LOW (ref 4.22–5.81)
RDW: 14 % (ref 11.5–15.5)
WBC: 5.2 10*3/uL (ref 4.0–10.5)
nRBC: 0 % (ref 0.0–0.2)

## 2021-06-04 LAB — GLUCOSE, CAPILLARY: Glucose-Capillary: 133 mg/dL — ABNORMAL HIGH (ref 70–99)

## 2021-06-04 LAB — LIPASE, BLOOD: Lipase: 67 U/L — ABNORMAL HIGH (ref 11–51)

## 2021-06-04 LAB — AMMONIA: Ammonia: 26 umol/L (ref 9–35)

## 2021-06-04 IMAGING — DX DG CHEST 1V PORT
1 series · 1 of 1 positions shown · non-contrast
Comparison: [DATE]

CLINICAL DATA: Short of breath.  Unresponsive.

EXAM:
PORTABLE CHEST 1 VIEW

[chest]
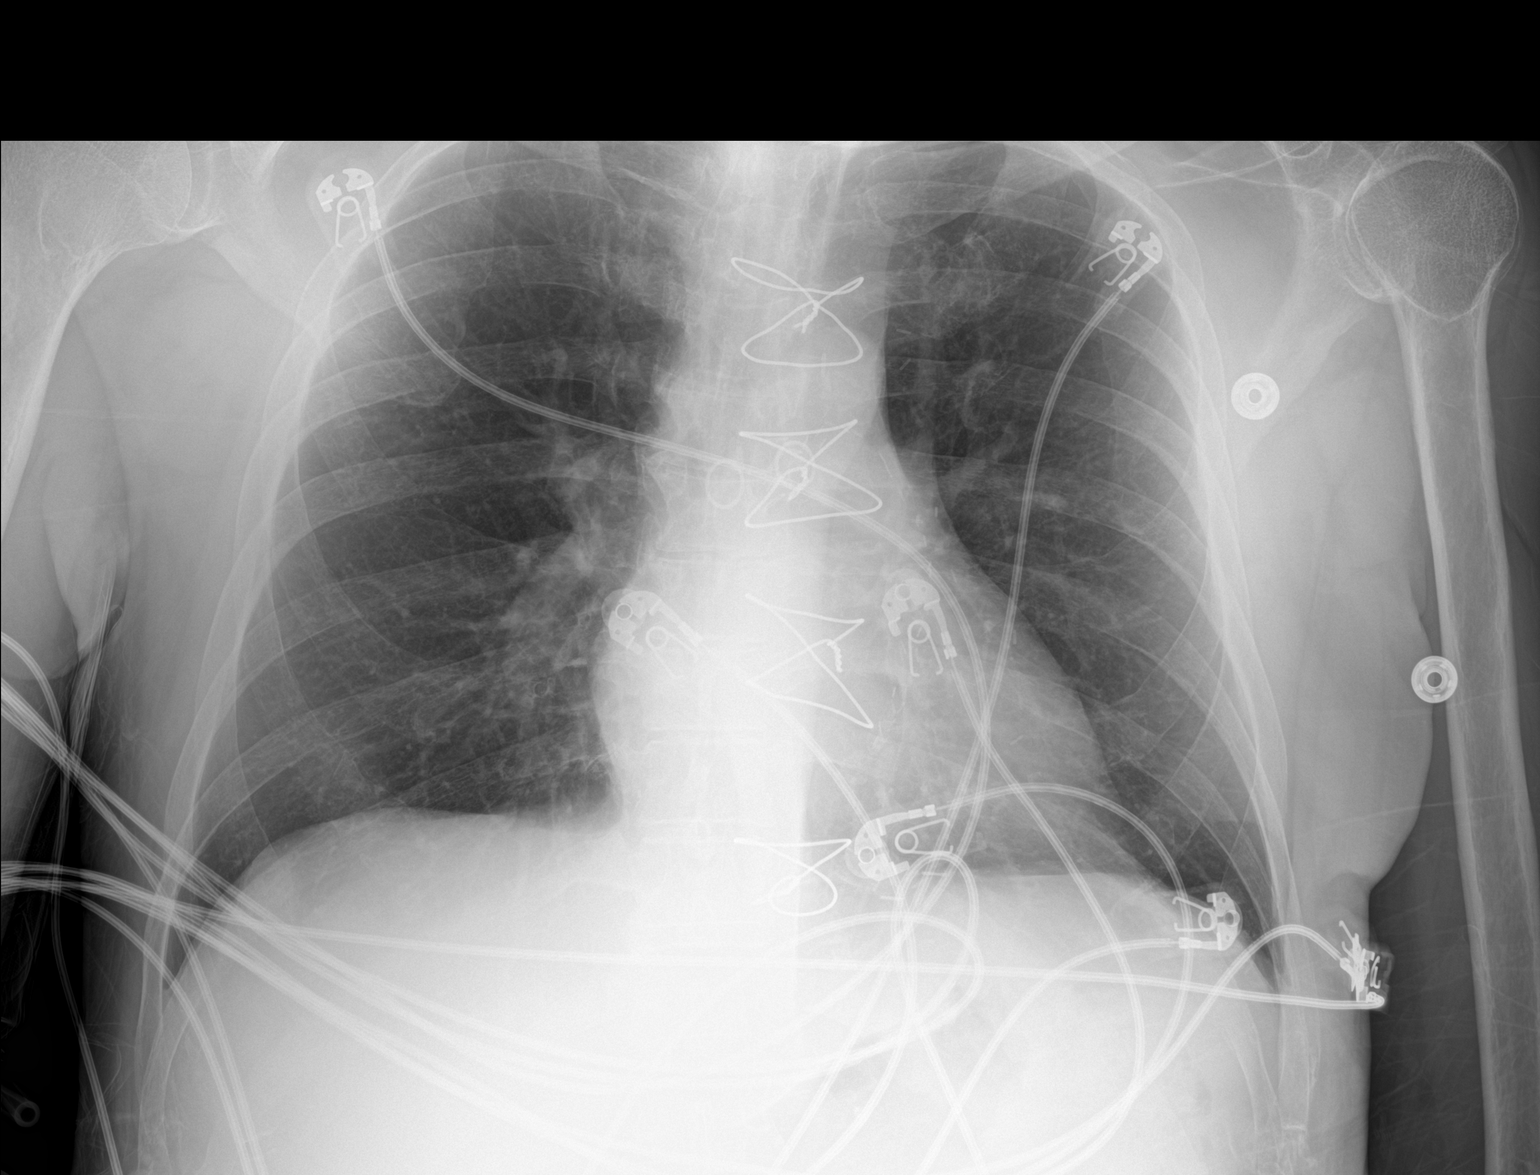

[1 of 1 positions shown; findings below may reference images not displayed]

FINDINGS: Stable changes from prior CABG surgery. Cardiac silhouette normal in
size and configuration. Normal mediastinal and hilar contours.

Clear lungs.  No pleural effusion or pneumothorax.

Skeletal structures are grossly intact.
IMPRESSION: No active disease.

## 2021-06-04 IMAGING — CT CT HEAD W/O CM
4 series · 16 of 47 positions shown, 18 images · non-contrast
Comparison: [DATE]

CLINICAL DATA: Altered mental status



[Series 3: head wo · axial · 0.45mm/px · z∈[-120,-0]mm · 7 of 34 slices shown, 9 images]
[im 5/34  brain]
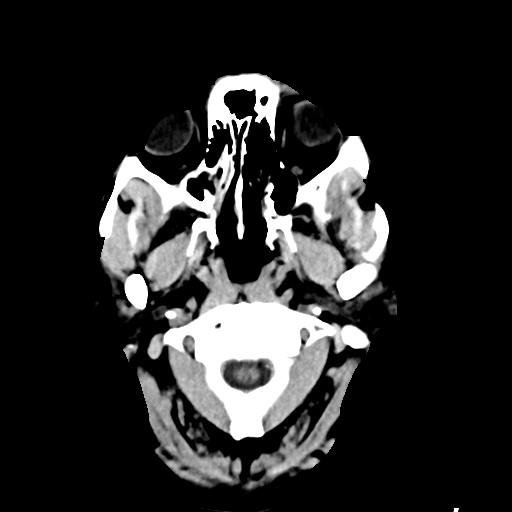
[im 5/34  bone]
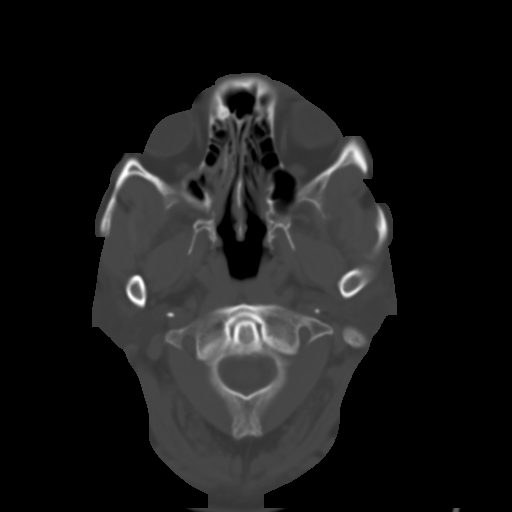
[im 9/34  brain]
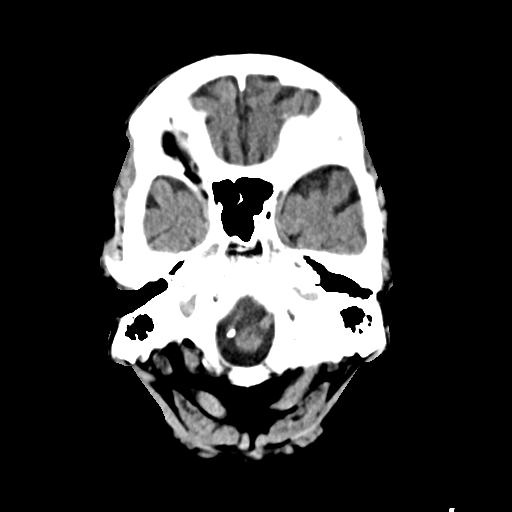
[im 13/34  brain]
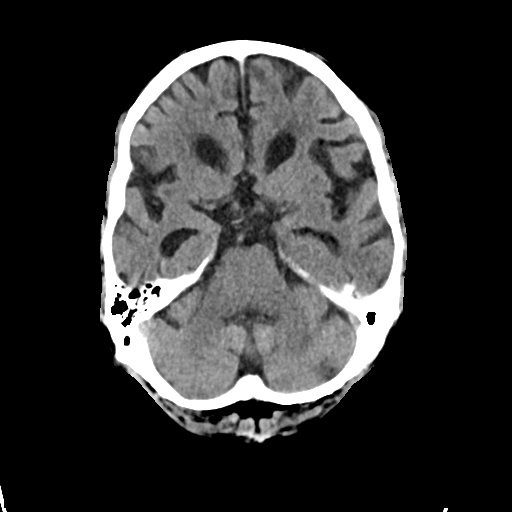
[im 17/34  brain]
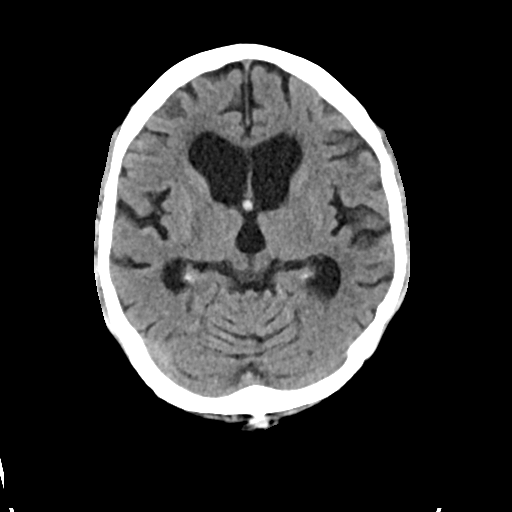
[im 21/34  brain]
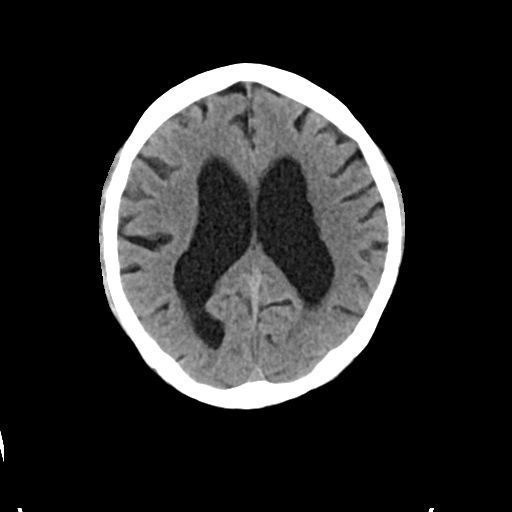
[im 21/34  bone]
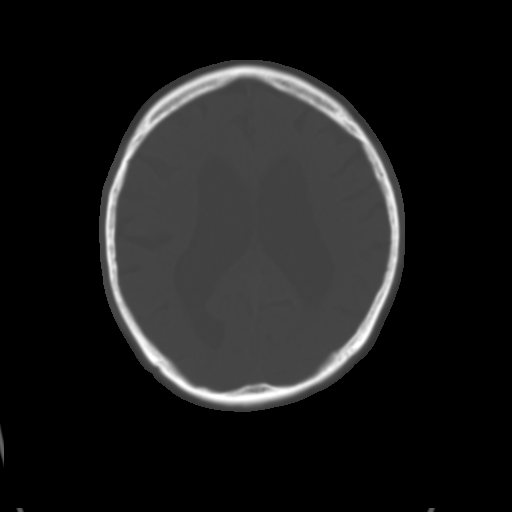
[im 25/34  brain]
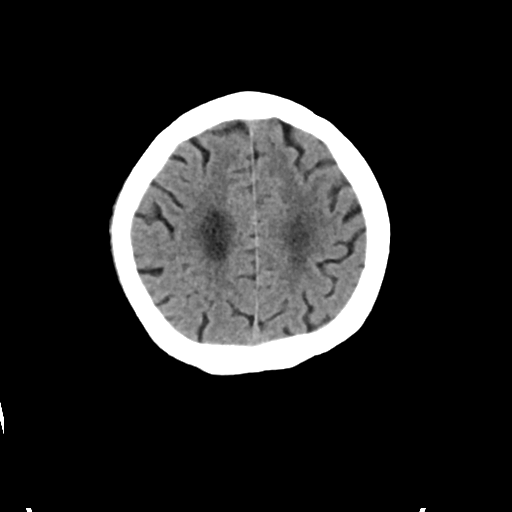
[im 29/34  brain]
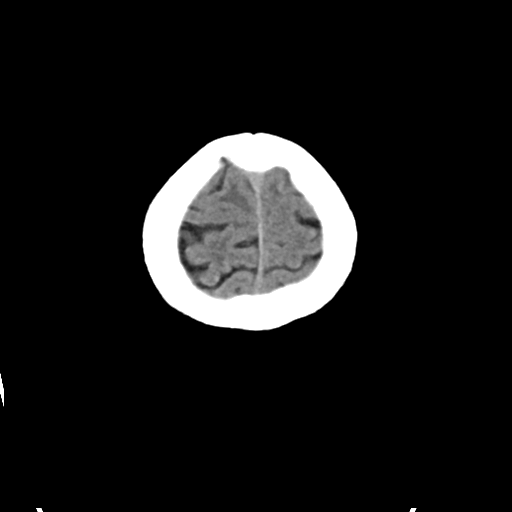

[Series 4: head bone · axial · 0.45mm/px · z∈[-124,-90]mm · 3 of 85 slices shown]
[im 9/85  bone]
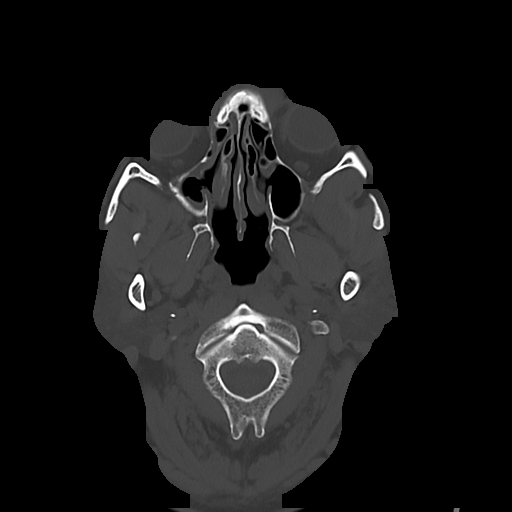
[im 17/85  bone]
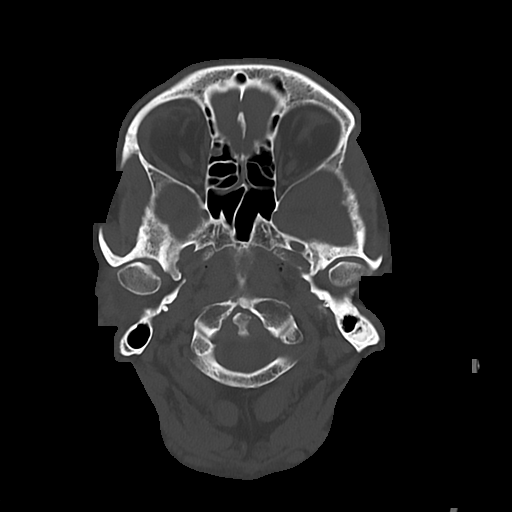
[im 26/85  bone]
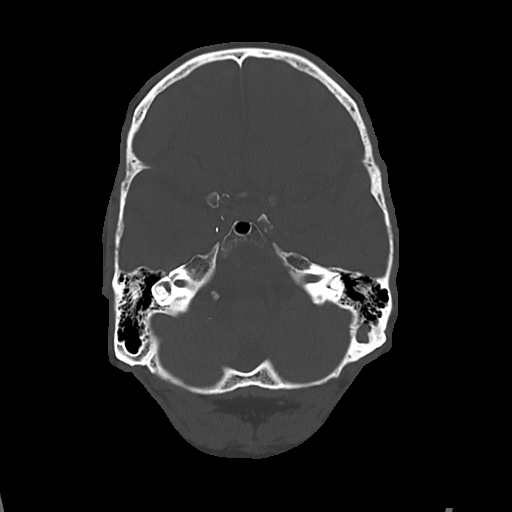

[Series 5: cor soft · coronal · 0.30mm/px · 3 of 64 slices shown]
[im 22/64  brain]
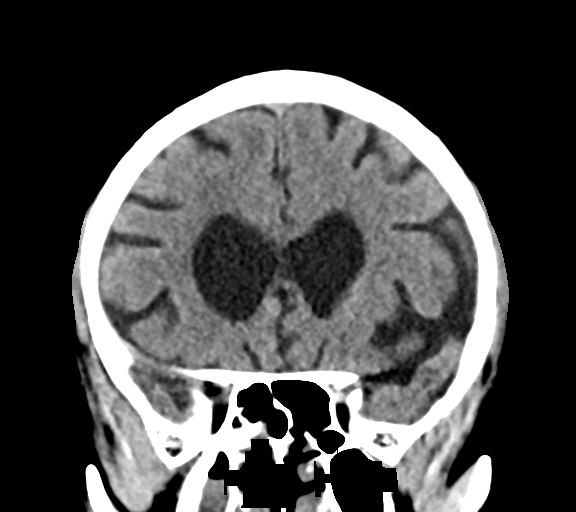
[im 29/64  brain]
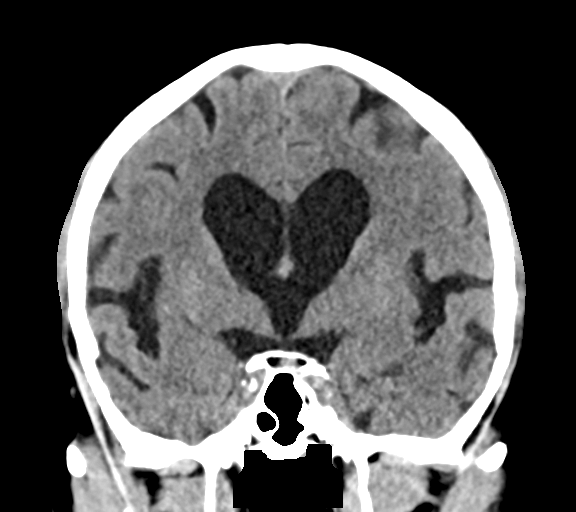
[im 36/64  brain]
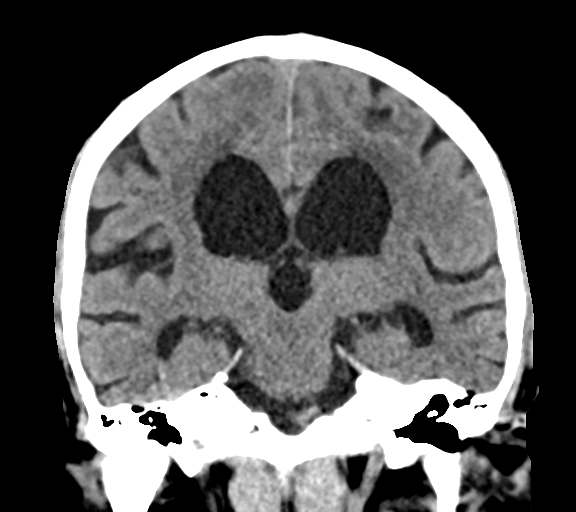

[Series 6: sag soft · sagittal · 0.33mm/px · 3 of 58 slices shown]
[im 20/58  brain]
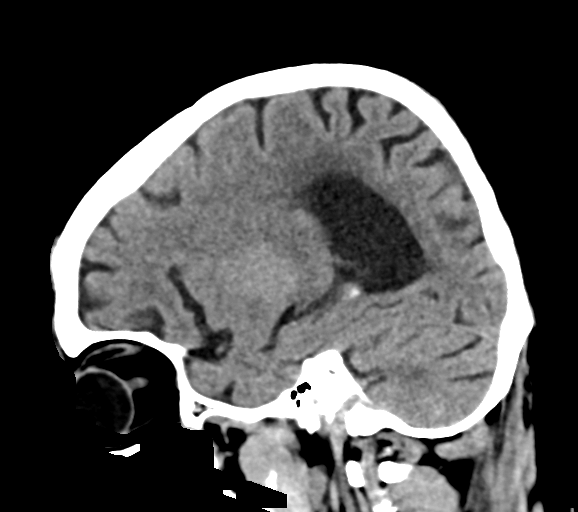
[im 29/58  brain]
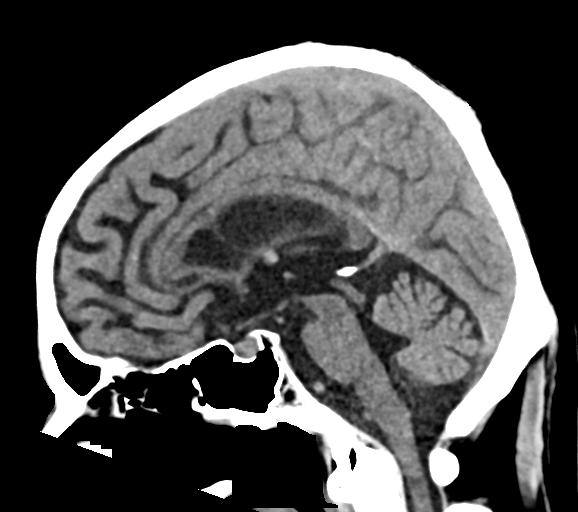
[im 39/58  brain]
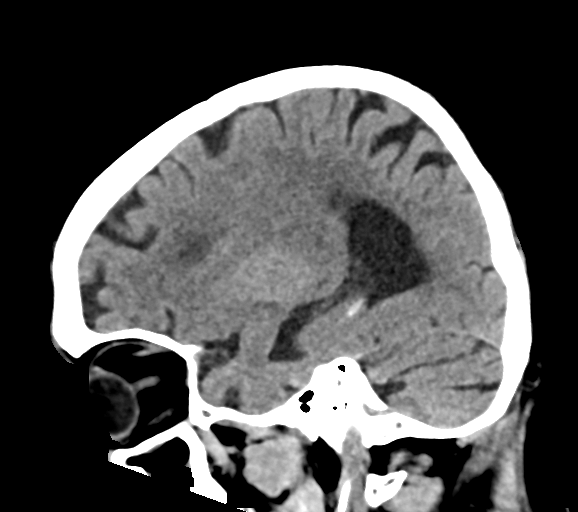

[16 of 47 positions shown; findings below may reference images not displayed]

FINDINGS: Brain: No acute intracranial findings are seen. There is dilation of
third and both lateral ventricles. There is 5 mm smooth marginated
high density structure in the area of foramen of PAKLAM with no
interval change. There are possible small old lacunar infarcts in
the right basal ganglia. Cortical sulci are prominent. There is
decreased density in the periventricular white matter.

Vascular: Unremarkable.

Skull: No fracture is seen. Posterior bony spurs are noted at C2-C3
level, more so on the right side.

Sinuses/Orbits: There is mucosal thickening in the ethmoid and both
maxillary sinuses.

Other: None
IMPRESSION: No acute intracranial findings are seen in noncontrast CT brain.
There is prominence of third and both lateral ventricles. Cortical
sulci are prominent suggesting atrophy.

There is 5 mm smooth marginated high density structure at the level
of foramen of PAKLAM, possibly colloid cysts.

Chronic sinusitis. Cervical spondylosis with posterior bony spurs
causing extrinsic pressure over the ventral margin of thecal sac on
the right side at C2-C3 level.

## 2021-06-04 MED ORDER — OXYBUTYNIN CHLORIDE 5 MG PO TABS: 5.0000 mg | ORAL_TABLET | Freq: Three times a day (TID) | ORAL | Status: DC | PRN

## 2021-06-04 MED ORDER — POTASSIUM CHLORIDE CRYS ER 20 MEQ PO TBCR
40.0000 meq | EXTENDED_RELEASE_TABLET | Freq: Every day | ORAL | Status: AC
  Administered 2021-06-05: 40 meq via ORAL
  Filled 2021-06-04: qty 2

## 2021-06-04 MED ORDER — NALOXONE HCL 0.4 MG/ML IJ SOLN: 0.4000 mg | INTRAMUSCULAR | Status: DC | PRN

## 2021-06-04 MED ORDER — ACETAMINOPHEN 325 MG PO TABS: 650.0000 mg | ORAL_TABLET | Freq: Four times a day (QID) | ORAL | Status: DC | PRN

## 2021-06-04 MED ORDER — POLYETHYLENE GLYCOL 3350 17 G PO PACK: 17.0000 g | PACK | Freq: Every day | ORAL | Status: DC | PRN

## 2021-06-04 MED ORDER — SODIUM CHLORIDE 0.9% FLUSH
3.0000 mL | Freq: Two times a day (BID) | INTRAVENOUS | Status: DC
  Administered 2021-06-04 – 2021-06-06 (×4): 3 mL via INTRAVENOUS

## 2021-06-04 MED ORDER — ENSURE ENLIVE PO LIQD
237.0000 mL | Freq: Two times a day (BID) | ORAL | Status: DC
  Administered 2021-06-05 – 2021-06-06 (×3): 237 mL via ORAL

## 2021-06-04 MED ORDER — ACETAMINOPHEN 650 MG RE SUPP: 650.0000 mg | Freq: Four times a day (QID) | RECTAL | Status: DC | PRN

## 2021-06-04 MED ORDER — SODIUM CHLORIDE 0.9 % IV BOLUS
500.0000 mL | Freq: Once | INTRAVENOUS | Status: AC
  Administered 2021-06-04: 500 mL via INTRAVENOUS

## 2021-06-04 MED ORDER — ASPIRIN EC 81 MG PO TBEC
81.0000 mg | DELAYED_RELEASE_TABLET | Freq: Every day | ORAL | Status: DC
  Administered 2021-06-05 – 2021-06-06 (×2): 81 mg via ORAL
  Filled 2021-06-04 (×2): qty 1

## 2021-06-04 MED ORDER — ENOXAPARIN SODIUM 40 MG/0.4ML IJ SOSY
40.0000 mg | PREFILLED_SYRINGE | INTRAMUSCULAR | Status: DC
  Administered 2021-06-04: 40 mg via SUBCUTANEOUS
  Filled 2021-06-04: qty 0.4

## 2021-06-04 MED ORDER — PNEUMOCOCCAL 20-VAL CONJ VACC 0.5 ML IM SUSY
0.5000 mL | PREFILLED_SYRINGE | INTRAMUSCULAR | Status: DC
  Filled 2021-06-04: qty 0.5

## 2021-06-04 MED ORDER — PANTOPRAZOLE SODIUM 40 MG PO TBEC
40.0000 mg | DELAYED_RELEASE_TABLET | Freq: Every day | ORAL | Status: DC
Start: 2021-06-05 — End: 2021-06-06
  Administered 2021-06-05 – 2021-06-06 (×2): 40 mg via ORAL
  Filled 2021-06-04 (×2): qty 1

## 2021-06-04 MED ORDER — POTASSIUM CHLORIDE CRYS ER 20 MEQ PO TBCR
40.0000 meq | EXTENDED_RELEASE_TABLET | Freq: Once | ORAL | Status: AC
  Administered 2021-06-04: 40 meq via ORAL
  Filled 2021-06-04: qty 2

## 2021-06-04 MED ORDER — MELATONIN 3 MG PO TABS
3.0000 mg | ORAL_TABLET | Freq: Every day | ORAL | Status: DC
  Administered 2021-06-04 – 2021-06-05 (×2): 3 mg via ORAL
  Filled 2021-06-04 (×2): qty 1

## 2021-06-04 MED ORDER — ROSUVASTATIN CALCIUM 20 MG PO TABS
40.0000 mg | ORAL_TABLET | Freq: Every day | ORAL | Status: DC
  Administered 2021-06-04 – 2021-06-06 (×3): 40 mg via ORAL
  Filled 2021-06-04 (×3): qty 2

## 2021-06-04 MED ORDER — DEXTROSE 50 % IV SOLN
INTRAVENOUS | Status: AC
  Administered 2021-06-04: 100 mL
  Filled 2021-06-04: qty 100

## 2021-06-04 MED ORDER — TAMSULOSIN HCL 0.4 MG PO CAPS
0.4000 mg | ORAL_CAPSULE | Freq: Every day | ORAL | Status: DC
  Administered 2021-06-05 – 2021-06-06 (×2): 0.4 mg via ORAL
  Filled 2021-06-04 (×2): qty 1

## 2021-06-04 NOTE — ED Notes (Signed)
Pt given PB and graham crackers with diet ginger ale ?
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
## 2021-06-04 NOTE — H&P (Addendum)
?History and Physical  ? ?Charles [name] KPQ:[id number] DOB: 1960/10/24 DOA: 06/04/2021 ? ?PCP: Darreld Mclean, MD  ? ?Patient coming from: Home ? ?Chief Complaint: Unresponsive ? ?HPI: Charles [name] is a 61 y.o. male with medical history significant of hypertension, CAD status post CABG, BPH with history of obstruction, diabetes, hyperlipidemia presenting after being found unresponsive. ? ?Patient found unresponsive by a neighbor who called EMS.  Last seen normal 2 days ago.  On EMS evaluation glucose was in the 70s, reportedly had rigid extremities but no other evidence of seizure but received Versed for possible seizure. Some relaxation after administration of Versed. ? ?Recently admitted for hyperglycemia and encephalopathy in February.  ? ?In the ED patient received D50 ampule after glucose noted to be in the 20s.  Became more alert but not fully oriented.  ? ?On my exam patient alert and oriented x3.  He states that he took higher dose of his insulin, 55 units last night.  He states that this was a higher dose that was recently prescribed.  He states that he does not always take the full dose and will take lower doses at times in the evening depending on how he feels.  He states he has taken the full dose in the past and did feel like his blood sugar was a little low the next day that time as well.   ? ?He states he really needs help with determining his correct doses he is unsure what to take.  He also states that he has not had any home health since his recent discharge and that he does feel weak at home. ? ?Patient denies fevers, chills, chest pain, shortness of breath, abdominal pain, constipation, diarrhea, nausea, vomiting. ? ?ED Course: Vital signs in the ED significant for blood pressure in the 511M to 211Z systolic, pulse in the 73V to 60s.  Lab work-up included CMP with potassium 2.8, glucose 26, calcium 8.5, protein 5.1, albumin 2.6.  CBC with hemoglobin 11.1 which is similar to 22-monthago.   Platelets noted to have clumping unable to quantify.  Lipase mildly elevated at 67.  Ammonia level normal.  Urinalysis with only hemoglobin and leukocytes.  Chest x-ray showed no acute abnormality.  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  Also noted was chronic sinusitis and spondylosis with spurring.  Of note, the ventricle and colloid cyst findings are similar to previous CT head in February.  Patient received 40 mEq p.o. potassium and 500 cc IV fluid in the ED.  Neurology was consulted and EDP is awaiting recommendations. ? ?Review of Systems: As per HPI otherwise all other systems reviewed and are negative. ? ?Past Medical History:  ?Diagnosis Date  ? Arthritis   ? [place]   ? Heart disease   ? Hypertension   ? ? ?Past Surgical History:  ?Procedure Laterality Date  ? CORONARY ARTERY BYPASS GRAFT    ? ? ?Social History ? reports that he has never smoked. He has never used smokeless tobacco. He reports that he does not currently use alcohol. He reports that he does not use drugs. ? ?No Known Allergies ? ?Family History  ?Problem Relation Age of Onset  ? Diabetes Mother   ? Hypertension Mother   ? Diabetes Father   ? Hypertension Father   ? Heart attack Father   ? Diabetes Sister   ? Diabetes Sister   ?Reviewed on admission ? ?  Prior to Admission medications   ?Medication Sig Start Date End Date Taking? Authorizing Provider  ?aspirin EC 81 MG tablet Take 81 mg by mouth daily. Swallow whole.    [provider]  ?blood glucose meter kit and supplies Dispense based on patient and insurance preference. Use up to four times daily as directed. (FOR ICD-10 E10.9, E11.9). 12/23/20   de [place], Raymond J, MD  ?clotrimazole-betamethasone (LOTRISONE) cream Apply topically 2 (two) times daily. Apply to foreskin 04/25/21   Rai, Vernelle Emerald, MD  ?Continuous Blood Gluc Sensor (FREESTYLE LIBRE 2 SENSOR) MISC 3 Units by  Does not apply route 3 (three) times daily with meals. 04/25/21   Rai, Vernelle Emerald, MD  ?gabapentin (NEURONTIN) 300 MG capsule Take 1 capsule (300 mg total) by mouth at bedtime. ?Patient taking differently: Take 300 mg by mouth once a week. 04/13/21   Penumalli, Earlean Polka, MD  ?HUMALOG KWIKPEN 100 UNIT/ML KwikPen Inject 3 Units into the skin 3 (three) times daily before meals. 04/25/21   Rai, Vernelle Emerald, MD  ?Insulin Glargine (BASAGLAR KWIKPEN) 100 UNIT/ML Inject 28 Units into the skin daily. 04/25/21   Rai, Ripudeep K, MD  ?metFORMIN (GLUCOPHAGE) 1000 MG tablet TAKE 1 TABLET (1,000 MG TOTAL) BY MOUTH 2 (TWO) TIMES DAILY WITH A MEAL. 05/15/21   de [place], Blondell Reveal, MD  ?oxybutynin (DITROPAN) 5 MG tablet Take 1 tablet (5 mg total) by mouth every 8 (eight) hours as needed for bladder spasms. 04/25/21   Rai, Vernelle Emerald, MD  ?pantoprazole (PROTONIX) 40 MG tablet Take 1 tablet (40 mg total) by mouth daily. Any generic PPI is okay to dispense 04/25/21   Rai, Vernelle Emerald, MD  ?rosuvastatin (CRESTOR) 40 MG tablet Take 1 tablet (40 mg total) by mouth daily. 04/25/21   Rai, Vernelle Emerald, MD  ?silodosin (RAPAFLO) 8 MG CAPS capsule Take 1 capsule (8 mg total) by mouth daily. 05/19/21   Stoneking, Reece Leader., MD  ? ? ?Physical Exam: ?Vitals:  ? 06/04/21 1715 06/04/21 1730 06/04/21 1745 06/04/21 1800  ?BP:  115/68  (!) 107/93  ?Pulse: 63 70 64 (!) 50  ?Resp: _0 (!) 29  ?SpO2: 100% 93% 100% 100%  ? ? ?Physical Exam ?Constitutional:   ?   General: He is not in acute distress. ?   Appearance: Normal appearance.  ?HENT:  ?   Head: Normocephalic and atraumatic.  ?   Mouth/Throat:  ?   Mouth: Mucous membranes are moist.  ?   Pharynx: Oropharynx is clear.  ?Eyes:  ?   Extraocular Movements: Extraocular movements intact.  ?   Pupils: Pupils are equal, round, and reactive to light.  ?Cardiovascular:  ?   Rate and Rhythm: Normal rate and regular rhythm.  ?   Pulses: Normal pulses.  ?   Heart sounds: Normal heart sounds.  ?Pulmonary:  ?   Effort:  Pulmonary effort is normal. No respiratory distress.  ?   Breath sounds: Normal breath sounds.  ?Abdominal:  ?   General: Bowel sounds are normal. There is no distension.  ?   Palpations: Abdomen is soft.  ?   Tenderness: There is no abdominal tenderness.  ?Musculoskeletal:     ?   General: No swelling or deformity.  ?Skin: ?   General: Skin is warm and dry.  ?Neurological:  ?   Motor: Weakness (Generalized) present.  ?   Comments: Mental Status: ?Patient is awake, alert, oriented x3 ?No signs of aphasia or neglect ?Cranial Nerves: ?II:  Pupils equal, round, and reactive to light.   ?III,IV, VI: EOMI without ptosis or diploplia.  ?V: Facial sensation is symmetric to light touch. ?VII: Facial movement is symmetric.  ?VIII: hearing is intact to voice ?X: Uvula elevates symmetrically ?XI: Shoulder shrug is symmetric. ?XII: tongue is midline without atrophy or fasciculations.  ?Motor: Good effort thorughout, at Least 5/5 bilateral UE, 5/5 bilateral lower extremitiy  ?Sensory: Sensation is grossly intact bilateral UEs & LEs ?Cerebellar: Finger-Nose intact bilalat  ? ?Labs on Admission: I have personally reviewed following labs and imaging studies ? ?CBC: ?Recent Labs  ?Lab 06/04/21 ?1444 06/04/21 ?1510  ?WBC 5.2  --   ?NEUTROABS 4.2  --   ?HGB 11.1* 9.5*  ?HCT 31.4* 28.0*  ?MCV 86.5  --   ?PLT PLATELET CLUMPS NOTED ON SMEAR, UNABLE TO ESTIMATE  --   ? ? ?Basic Metabolic Panel: ?Recent Labs  ?Lab 06/04/21 ?1444 06/04/21 ?1510  ?NA 142 137  ?K 2.8* 2.8*  ?CL 111  --   ?CO2 25  --   ?GLUCOSE 26*  --   ?BUN 8  --   ?CREATININE 0.53*  --   ?CALCIUM 8.5*  --   ? ? ?GFR: ?CrCl cannot be calculated (Unknown ideal weight.). ? ?Liver Function Tests: ?Recent Labs  ?Lab 06/04/21 ?1444  ?AST 31  ?ALT 33  ?ALKPHOS 57  ?BILITOT 0.6  ?PROT 5.1*  ?ALBUMIN 2.6*  ? ? ?Urine analysis: ?   ?Component Value Date/Time  ? COLORURINE YELLOW 06/04/2021 1546  ? APPEARANCEUR HAZY (A) 06/04/2021 1546  ? APPEARANCEUR Hazy (A) 05/09/2021 1006  ?  LABSPEC 1.008 06/04/2021 1546  ? PHURINE 5.0 06/04/2021 1546  ? GLUCOSEU 150 (A) 06/04/2021 1546  ? HGBUR LARGE (A) 06/04/2021 1546  ? [place] NEGATIVE 06/04/2021 1546  ? BILIRUBINUR Negative 05/09/2021 1006  ?

## 2021-06-04 NOTE — ED Provider Notes (Signed)
?[facility] ?Provider Note ? ? ?CSN: [id number] ?Arrival date & time: 06/04/21  1427 ? ?  ? ?History ? ?No chief complaint on file. ? ? ?Charles [name] is a 61 y.o. male. ? ?HPI ? 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  Patient was reportedly found at home in his bed.  Last seen normal 2 days ago.  Fingerstick in the field was mentioned to be in the mid 70s.  EMS and fire appreciated the patient to have rigid extremities which improved with a dose of Versed but no other seizure-like activity.  On arrival patient is laying in bed, protecting his airway, normal respirations.  Arms are forcefully folded over his chest.  No signs of obvious trauma, intermittent moaning, pupils are approximately 2 mm bilateral and reactive.  No report of any alcohol/drug paraphernalia found around the patient at the home. ? ?Home Medications ?Prior to Admission medications   ?Medication Sig Start Date End Date Taking? Authorizing Provider  ?aspirin EC 81 MG tablet Take 81 mg by mouth daily. Swallow whole.    [provider]  ?blood glucose meter kit and supplies Dispense based on patient and insurance preference. Use up to four times daily as directed. (FOR ICD-10 E10.9, E11.9). 12/23/20   de [place], Raymond J, MD  ?clotrimazole-betamethasone (LOTRISONE) cream Apply topically 2 (two) times daily. Apply to foreskin 04/25/21   Rai, Vernelle Emerald, MD  ?Continuous Blood Gluc Sensor (FREESTYLE LIBRE 2 SENSOR) MISC 3 Units by Does not apply route 3 (three) times daily with meals. 04/25/21   Rai, Vernelle Emerald, MD  ?gabapentin (NEURONTIN) 300 MG capsule Take 1 capsule (300 mg total) by mouth at bedtime. ?Patient taking differently: Take 300 mg by mouth once a week. 04/13/21   Penumalli, Earlean Polka, MD  ?HUMALOG  KWIKPEN 100 UNIT/ML KwikPen Inject 3 Units into the skin 3 (three) times daily before meals. 04/25/21   Rai, Vernelle Emerald, MD  ?Insulin Glargine (BASAGLAR KWIKPEN) 100 UNIT/ML Inject 28 Units into the skin daily. 04/25/21   Rai, Ripudeep K, MD  ?metFORMIN (GLUCOPHAGE) 1000 MG tablet TAKE 1 TABLET (1,000 MG TOTAL) BY MOUTH 2 (TWO) TIMES DAILY WITH A MEAL. 05/15/21   de [place], Blondell Reveal, MD  ?oxybutynin (DITROPAN) 5 MG tablet Take 1 tablet (5 mg total) by mouth every 8 (eight) hours as needed for bladder spasms. 04/25/21   Rai, Vernelle Emerald, MD  ?pantoprazole (PROTONIX) 40 MG tablet Take 1 tablet (40 mg total) by mouth daily. Any generic PPI is okay to dispense 04/25/21   Rai, Vernelle Emerald, MD  ?rosuvastatin (CRESTOR) 40 MG tablet Take 1 tablet (40 mg total) by mouth daily. 04/25/21   Rai, Vernelle Emerald, MD  ?silodosin (RAPAFLO) 8 MG CAPS capsule Take 1 capsule (8 mg total) by mouth daily. 05/19/21   Stoneking, Reece Leader., MD  ?   ? ?Allergies    ?Patient has no known allergies.   ? ?Review of Systems   ?Review of Systems  ?Unable to perform ROS: Patient unresponsive  ? ?Physical Exam ?Updated Vital Signs ?BP 117/72   Pulse (!) 59   Resp 17   SpO2 100%  ?Physical Exam ?Vitals and nursing note reviewed.  ?Constitutional:   ?   Appearance: He is not diaphoretic.  ?HENT:  ?   Head: Normocephalic and atraumatic.  ?  Right Ear: External ear normal.  ?   Left Ear: External ear normal.  ?   Mouth/Throat:  ?   Mouth: Mucous membranes are dry.  ?Eyes:  ?   Pupils: Pupils are equal, round, and reactive to light.  ?Cardiovascular:  ?   Rate and Rhythm: Normal rate.  ?Pulmonary:  ?   Effort: Pulmonary effort is normal. No respiratory distress.  ?Abdominal:  ?   Palpations: Abdomen is soft.  ?Genitourinary: ?   Comments: Foley ?Musculoskeletal:     ?   General: No deformity.  ?Skin: ?   General: Skin is warm.  ? ? ?ED Results / Procedures / Treatments   ?Labs ?(all labs ordered are listed, but only abnormal results are displayed) ?Labs Reviewed   ?CBC WITH DIFFERENTIAL/PLATELET - Abnormal; Notable for the following components:  ?    Result Value  ? RBC 3.63 (*)   ? Hemoglobin 11.1 (*)   ? HCT 31.4 (*)   ? Lymphs Abs 0.6 (*)   ? All other components within normal limits  ?COMPREHENSIVE METABOLIC PANEL - Abnormal; Notable for the following components:  ? Potassium 2.8 (*)   ? Glucose, Bld 26 (*)   ? Creatinine, Ser 0.53 (*)   ? Calcium 8.5 (*)   ? Total Protein 5.1 (*)   ? Albumin 2.6 (*)   ? All other components within normal limits  ?LIPASE, BLOOD - Abnormal; Notable for the following components:  ? Lipase 67 (*)   ? All other components within normal limits  ?CBG MONITORING, ED - Abnormal; Notable for the following components:  ? Glucose-Capillary 23 (*)   ? All other components within normal limits  ?I-STAT VENOUS BLOOD GAS, ED - Abnormal; Notable for the following components:  ? pH, Ven 7.435 (*)   ? pCO2, Ven 36.3 (*)   ? pO2, Ven 165 (*)   ? Potassium 2.8 (*)   ? Calcium, Ion 1.14 (*)   ? HCT 28.0 (*)   ? Hemoglobin 9.5 (*)   ? All other components within normal limits  ?CBG MONITORING, ED - Abnormal; Notable for the following components:  ? Glucose-Capillary 271 (*)   ? All other components within normal limits  ?AMMONIA  ?URINALYSIS, ROUTINE W REFLEX MICROSCOPIC  ? ? ?EKG ?None ? ?Radiology ?CT Head Wo Contrast ? ?Result Date: 06/04/2021 ?CLINICAL DATA:  Altered mental status EXAM: CT HEAD WITHOUT CONTRAST TECHNIQUE: Contiguous axial images were obtained from the base of the skull through the vertex without intravenous contrast. RADIATION DOSE REDUCTION: This exam was performed according to the departmental dose-optimization program which includes automated exposure control, adjustment of the mA and/or kV according to patient size and/or use of iterative reconstruction technique. COMPARISON:  04/22/2021 FINDINGS: Brain: No acute intracranial findings are seen. There is dilation of third and both lateral ventricles. There is 5 mm smooth marginated high  density structure in the area of foramen of Monro with no interval change. There are possible small old lacunar infarcts in the right basal ganglia. Cortical sulci are prominent. There is decreased density in the periventricular white matter. Vascular: Unremarkable. Skull: No fracture is seen. Posterior bony spurs are noted at C2-C3 level, more so on the right side. Sinuses/Orbits: There is mucosal thickening in the ethmoid and both maxillary sinuses. Other: None IMPRESSION: No acute intracranial findings are seen in noncontrast CT brain. There is prominence of third and both lateral ventricles. Cortical sulci are prominent suggesting atrophy. 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 Chronic sinusitis. Cervical spondylosis with posterior bony spurs causing extrinsic pressure over the ventral margin of thecal sac on the right side at C2-C3 level. Electronically Signed   By: Elmer Picker M.D.   On: 06/04/2021 15:56  ? ?DG Chest Port 1 View ? ?Result Date: 06/04/2021 ?CLINICAL DATA:  Short of breath.  Unresponsive. EXAM: PORTABLE CHEST 1 VIEW COMPARISON:  04/22/2021 FINDINGS: Stable changes from prior CABG surgery. Cardiac silhouette normal in size and configuration. Normal mediastinal and hilar contours. Clear lungs.  No pleural effusion or pneumothorax. Skeletal structures are grossly intact. IMPRESSION: No active disease. Electronically Signed   By: Lajean Manes M.D.   On: 06/04/2021 15:04   ? ?Procedures ?Marland KitchenCritical Care ?Performed by: Lorelle Gibbs, DO ?Authorized by: Lorelle Gibbs, DO  ? ?Critical care provider statement:  ?  Critical care time (minutes):  60 ?  Critical care was necessary to treat or prevent imminent or life-threatening deterioration of the following conditions:  Metabolic crisis and endocrine crisis ?  Critical care was time spent personally by me on the following activities:  Development of treatment plan  with patient or surrogate, discussions with consultants, evaluation of patient's response to treatment, examination of patient, ordering and review of laboratory studies, ordering and review of radiographic stud

## 2021-06-04 NOTE — ED Notes (Signed)
Pt returned from CT °

## 2021-06-04 NOTE — ED Notes (Signed)
Checked patient cbg it was 43 notified RN of blood sugar patient is resting with call bell in reach  ?

## 2021-06-04 NOTE — ED Notes (Signed)
Pt continues to state he is confused about what happened and why he is here. He is asking about family.  He is beginning to remember what I tell him about why he is here but not much from before waking up at hospital.  ?

## 2021-06-04 NOTE — ED Notes (Signed)
Pt eating dinner tray °

## 2021-06-04 NOTE — ED Notes (Signed)
Pt states that he remembers having the catheter placed to help with urinary retention.  ? ?He remembers his sister when asked her name from contact list.  ? ?

## 2021-06-04 NOTE — ED Notes (Addendum)
Confirmed with floor that room is still being cleaned and report message has been sent. ?

## 2021-06-04 NOTE — ED Notes (Signed)
Patient transported to CT 

## 2021-06-04 NOTE — Progress Notes (Deleted)
Therapist, music at [employer] ?[address], [address] ?[place], [place] [address] ?336 [phone] ?Fax [phone] ? ?Date:  06/08/2021  ? ?Name:  Charles [name]   DOB:  1961/02/28   MRN:  [id number] ? ?PCP:  Darreld Mclean, MD  ? ? ?Chief Complaint: No chief complaint on file. ? ? ?History of Present Illness: ? ?Charles [name] is a 61 y.o. very pleasant male patient who presents with the following: ? ?Pt seen today for follow-up; history of diabetes, hyperlipidemia, CAD status post CABG, hypertension, possible neuromuscular disease with diffuse weakness, undergoing work-up ?Last visit with myself was in February for hospital discharge follow-up-he had been admitted with DKA and acute urinary retention ?His endocrinologist is Dr. Chalmers Cater ?At last visit he still had a Foley catheter-he is being managed by [facility] urology and unfortunately has not been able to discontinue catheter due to difficulty voiding.  He underwent cystoscopy in March 20 ? ?Cardiologist is Dr. Harrell Gave, most recent visit February 22 ?He is also following with neurology-most recent visit was in February ? ?Since our last visit he actually got admitted again with severe hyperglycemia: ?Admit date: 06/04/2021 ?Discharge date: 06/06/2021 ?Admitted From: Home ?Disposition:  Home  ?Recommendations for Outpatient Follow-up:  ?Follow up with PCP in 1 weeks ?Please obtain BMP/CBC in one week ?Please follow up on the following pending results: Vitamin D 25  ?Brief/Interim Summary: ?Charles [name] is a 61 y.o. male with medical history significant of hypertension, CAD status post CABG, BPH with history of obstruction, diabetes, hyperlipidemia presenting after being found unresponsive. ?-His friend sent police to check on him as he did not answer his phone call, he was found by EMS to be unresponsive on the floor, he was given 1 dose of Versed as he was found to be stiff, but no seizures were noted, his work-up was significant for hypoglycemia  where he was as low as 23 while in ED, apparently patient did take the wrong dose of his long-acting insulin, so he was admitted for further work-up. ? ?Acute encephalopathy ?Hypoglycemia ?- Patient found unresponsive by neighbors last seen normal 2 days ago.  Noted to have CBG in the 70s on the scene but was in the 20s in the ED.  Received amp of D50 with some improvement in mentation but not all the way back to normal. ?-No further hypoglycemia during hospital stay. 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 ?  ?Hypokalemia ?-Low potassium 2.8 while in ED, this was repleted. ? ?Diabetes mellitus type II, poorly controlled, insulin-dependent ?-  Presented with altered mental status and hypoglycemia as above.  Recently admitted for DKA and encephalopathy. ?-Patient was discharged on glargine 28 units, he used to be on 55 units in the past, apparently he took 55 units yesterday.. ?- Reportedly glucose was 70s in the field, 20s on arrival to the ED, now trending from 200s to 100s. ?-Patient has been started on Semglee 28 units during hospital stay and insulin sliding scale.  With good CBG controlled, he was instructed to continue his insulin at  28 units, he acknowledged understanding of the appropriate dose. ?CAD ?Hyperlipidemia ?- Status post CABG ?- Continue home aspirin and rosuvastatin ?BPH history of obstruction ?-History of obstruction with prior renal failure. ?> Foley catheter re--placed March 9th ?- Continue home oxybutynin and  replace home silodosin with formulary Flomax ?-Patient is following with Dr. Alyson Ingles, from urology, and being planned for urodynamic study. ?Weakness ? Nursing facility recommended after recent admission.  Patient declined but did agree to home health.  Which has been arranged. ?Hypomagnesemia ?-Repleted ? ?Foot exam ?Colon cancer screening ?Eye exam ?Hepatitis C screening ? ?Patient Active Problem List  ? Diagnosis Date Noted  ? Diarrhea 04/24/2021  ? Acute urinary retention 04/23/2021  ? Nausea & vomiting 04/22/2021  ? Dehydration 04/22/2021  ? Acute prerenal azotemia 04/22/2021  ? Leukocytosis 04/22/2021  ? HLD (hyperlipidemia) 04/22/2021  ? Acute metabolic encephalopathy 25/07/3974  ? DKA (diabetic ketoacidosis) ([place]) 04/21/2021  ? 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/23/2021  ? Type 2 diabetes mellitus with complication, with long-term current use of insulin ([place]) 03/23/2021  ? BPH with obstruction/lower urinary tract symptoms 02/08/2021  ? Phimosis 01/27/2021  ? Hypertension 12/23/2020  ? Coronary artery disease involving native coronary artery of native heart without angina pectoris 12/23/2020  ? ? ?Past Medical History:  ?Diagnosis Date  ? Arthritis   ? Diabetes mellitus without complication ([place])   ? Heart disease   ? Hypertension   ? ? ?Past Surgical History:  ?Procedure Laterality Date  ? CORONARY ARTERY BYPASS GRAFT    ? ? ?Social History  ? ?Tobacco Use  ? Smoking status: Never  ? Smokeless tobacco: Never  ?Vaping Use  ? Vaping Use: Never used  ?Substance Use Topics  ? Alcohol use: Not Currently  ?  Comment: none in 6 months may open a bottle of wine tonight [id number]  ? Drug use: Never  ? ? ?Family History  ?Problem Relation Age of Onset  ? Diabetes Mother   ? Hypertension Mother   ? Diabetes Father   ? Hypertension Father   ? Heart attack Father   ? Diabetes Sister   ? Diabetes Sister   ? ? ?No Known Allergies ? ?Medication list has been reviewed and updated. ? ?Current Outpatient Medications on File Prior to Visit   ?Medication Sig Dispense Refill  ? aspirin EC 81 MG tablet Take 81 mg by mouth daily. Swallow whole.    ? blood glucose meter kit and supplies Dispense based on patient and insurance preference. Use up to four times daily as directed. (FOR ICD-10 E10.9, E11.9). 1 each 11  ? clotrimazole-betamethasone (LOTRISONE) cream Apply topically 2 (two) times daily. Apply to foreskin 45 g 4  ? Continuous Blood Gluc Sensor (FREESTYLE LIBRE 2 SENSOR) MISC 3 Units by Does not apply route 3 (three) times daily with meals. 1 each 3  ? gabapentin (NEURONTIN) 300 MG capsule Take 1 capsule (300 mg total) by mouth at bedtime. (Patient taking differently: Take 300 mg by mouth once a week.) 30 capsule 3  ? HUMALOG KWIKPEN 100 UNIT/ML KwikPen Inject 3 Units into the skin 3 (three) times daily before meals. 15 mL 3  ? Insulin Glargine (BASAGLAR KWIKPEN) 100 UNIT/ML Inject 28 Units into the skin daily. 15 mL 2  ? metFORMIN (GLUCOPHAGE) 1000 MG tablet TAKE 1 TABLET (1,000 MG TOTAL) BY MOUTH 2 (TWO) TIMES DAILY WITH A MEAL. 60 tablet 0  ? oxybutynin (DITROPAN) 5 MG tablet Take 1 tablet (5 mg total) by mouth every 8 (eight)  hours as needed for bladder spasms. 90 tablet 1  ? pantoprazole (PROTONIX) 40 MG tablet Take 1 tablet (40 mg total) by mouth daily. Any generic PPI is okay to dispense 30 tablet 1  ? rosuvastatin (CRESTOR) 40 MG tablet Take 1 tablet (40 mg total) by mouth daily. 30 tablet 3  ? silodosin (RAPAFLO) 8 MG CAPS capsule Take 1 capsule (8 mg total) by mouth daily. 30 capsule 11  ? ?No current facility-administered medications on file prior to visit.  ? ? ?Review of Systems: ? ?As per HPI- otherwise negative. ? ? ?Physical Examination: ?There were no vitals filed for this visit. ?There were no vitals filed for this visit. ?There is no height or weight on file to calculate BMI. ?Ideal Body Weight:   ? ?GEN: no acute distress. ?HEENT: Atraumatic, Normocephalic.  ?Ears and Nose: No external deformity. ?CV: RRR, No M/G/R. No JVD. No  thrill. No extra heart sounds. ?PULM: CTA B, no wheezes, crackles, rhonchi. No retractions. No resp. distress. No accessory muscle use. ?ABD: S, NT, ND, +BS. No rebound. No HSM. ?EXTR: No c/c/e ?PSYCH: [place]

## 2021-06-05 DIAGNOSIS — E162 Hypoglycemia, unspecified: Secondary | ICD-10-CM | POA: Diagnosis not present

## 2021-06-05 DIAGNOSIS — G9340 Encephalopathy, unspecified: Secondary | ICD-10-CM | POA: Diagnosis not present

## 2021-06-05 DIAGNOSIS — N401 Enlarged prostate with lower urinary tract symptoms: Secondary | ICD-10-CM | POA: Diagnosis not present

## 2021-06-05 DIAGNOSIS — E118 Type 2 diabetes mellitus with unspecified complications: Secondary | ICD-10-CM | POA: Diagnosis not present

## 2021-06-05 LAB — GLUCOSE, CAPILLARY
Glucose-Capillary: 119 mg/dL — ABNORMAL HIGH (ref 70–99)
Glucose-Capillary: 159 mg/dL — ABNORMAL HIGH (ref 70–99)
Glucose-Capillary: 162 mg/dL — ABNORMAL HIGH (ref 70–99)
Glucose-Capillary: 192 mg/dL — ABNORMAL HIGH (ref 70–99)
Glucose-Capillary: 228 mg/dL — ABNORMAL HIGH (ref 70–99)
Glucose-Capillary: 265 mg/dL — ABNORMAL HIGH (ref 70–99)
Glucose-Capillary: 286 mg/dL — ABNORMAL HIGH (ref 70–99)

## 2021-06-05 LAB — CBC
HCT: 32.7 % — ABNORMAL LOW (ref 39.0–52.0)
Hemoglobin: 11.6 g/dL — ABNORMAL LOW (ref 13.0–17.0)
MCH: 30.4 pg (ref 26.0–34.0)
MCHC: 35.5 g/dL (ref 30.0–36.0)
MCV: 85.8 fL (ref 80.0–100.0)
Platelets: 150 10*3/uL (ref 150–400)
RBC: 3.81 MIL/uL — ABNORMAL LOW (ref 4.22–5.81)
RDW: 14 % (ref 11.5–15.5)
WBC: 7.2 10*3/uL (ref 4.0–10.5)
nRBC: 0 % (ref 0.0–0.2)

## 2021-06-05 LAB — COMPREHENSIVE METABOLIC PANEL
ALT: 37 U/L (ref 0–44)
AST: 36 U/L (ref 15–41)
Albumin: 2.9 g/dL — ABNORMAL LOW (ref 3.5–5.0)
Alkaline Phosphatase: 63 U/L (ref 38–126)
Anion gap: 5 (ref 5–15)
BUN: 8 mg/dL (ref 6–20)
CO2: 27 mmol/L (ref 22–32)
Calcium: 8.8 mg/dL — ABNORMAL LOW (ref 8.9–10.3)
Chloride: 107 mmol/L (ref 98–111)
Creatinine, Ser: 0.72 mg/dL (ref 0.61–1.24)
GFR, Estimated: >60 mL/min (ref >60)
Glucose, Bld: 142 mg/dL — ABNORMAL HIGH (ref 70–99)
Potassium: 3.7 mmol/L (ref 3.5–5.1)
Sodium: 139 mmol/L (ref 135–145)
Total Bilirubin: 0.4 mg/dL (ref 0.3–1.2)
Total Protein: 5.4 g/dL — ABNORMAL LOW (ref 6.5–8.1)

## 2021-06-05 MED ORDER — INSULIN ASPART 100 UNIT/ML IJ SOLN
0.0000 [IU] | Freq: Three times a day (TID) | INTRAMUSCULAR | Status: DC
  Administered 2021-06-05: 5 [IU] via SUBCUTANEOUS
  Administered 2021-06-05: 2 [IU] via SUBCUTANEOUS
  Administered 2021-06-06: 5 [IU] via SUBCUTANEOUS

## 2021-06-05 MED ORDER — LACTATED RINGERS IV SOLN: INTRAVENOUS | Status: DC

## 2021-06-05 MED ORDER — TRAZODONE HCL 50 MG PO TABS
50.0000 mg | ORAL_TABLET | Freq: Once | ORAL | Status: AC
  Administered 2021-06-05: 50 mg via ORAL
  Filled 2021-06-05: qty 1

## 2021-06-05 MED ORDER — INSULIN ASPART 100 UNIT/ML IJ SOLN
0.0000 [IU] | Freq: Every day | INTRAMUSCULAR | Status: DC
  Administered 2021-06-05: 3 [IU] via SUBCUTANEOUS

## 2021-06-05 MED ORDER — INSULIN GLARGINE-YFGN 100 UNIT/ML ~~LOC~~ SOLN
28.0000 [IU] | Freq: Every day | SUBCUTANEOUS | Status: DC
  Administered 2021-06-05 – 2021-06-06 (×2): 28 [IU] via SUBCUTANEOUS
  Filled 2021-06-05 (×2): qty 0.28

## 2021-06-05 MED ORDER — MAGNESIUM SULFATE 4 GM/100ML IV SOLN
4.0000 g | Freq: Once | INTRAVENOUS | Status: AC
  Administered 2021-06-05: 4 g via INTRAVENOUS
  Filled 2021-06-05: qty 100

## 2021-06-05 MED ORDER — CHLORHEXIDINE GLUCONATE CLOTH 2 % EX PADS
6.0000 | MEDICATED_PAD | Freq: Every day | CUTANEOUS | Status: DC
  Administered 2021-06-05 – 2021-06-06 (×2): 6 via TOPICAL

## 2021-06-05 NOTE — Consult Note (Addendum)
?                    NEURO HOSPITALIST CONSULT NOTE  ? ?Requestig physician: Dr. Waldron Labs ? ?Reason for Consult: Widened ventricles on CT head ? ?History obtained from:  Patient and Chart    ? ?HPI:                                                                                                                                         ? Charles [name] is an 61 y.o. male with a PMHx of DM, CAD s/p CABG, arthritis, urinary retention/obstruction secondary to BPH with Foley, HTN and recent admission for DKA/metabolic encephalopathy, presenting after a severe hypoglycemic spell with LOC at home secondary to incorrect insulin dose. He presented via EMS to the ED and was unresponsive on arrival. EMS and fire appreciated the patient to have rigid extremities which improved with a dose of Versed but no other seizure-like activity. LKN was two days prior to presentation. He was found unconscious in his bed at home by neighbors.  ? ?In the ED he received one ampule of D50 after glucose was noted to be in the 20s.  He then became more alert but not fully oriented. ? ?CT head showed widened ventricles and sulci, appearing most consistent with atrophic changes. However, in the context of the patient's history of unsteady gait and intermittent confusion, Neurology was called to evaluate for possible NPH.  ? ?On further interview with the patient, he denies any urinary incontinence prior to placement of his foley catheter. He also denies episodes of confusion. He does endorse a 5 month history of gait unsteadiness, with approximately 4-5 falls during that time period. He usually falls backwards and has a sense of being unsteady while walking, but has not experienced presyncope or vertigo. He endorses having had his insulin regimen changed about 3 months ago, which is roughly concurrent with the episodes of gait unsteadiness and falls. He endorses that maybe he has been having low sugars leading to the falls.  ? ?Past Medical  History:  ?Diagnosis Date  ? Arthritis   ? Diabetes mellitus without complication ([place])   ? Heart disease   ? Hypertension   ? ? ?Past Surgical History:  ?Procedure Laterality Date  ? CORONARY ARTERY BYPASS GRAFT    ? ? ?Family History  ?Problem Relation Age of Onset  ? Diabetes Mother   ? Hypertension Mother   ? Diabetes Father   ? Hypertension Father   ? Heart attack Father   ? Diabetes Sister   ? Diabetes Sister   ?          ? ?Social History:  reports that he has never smoked. He has never used smokeless tobacco. He reports that he does not currently use alcohol. He reports that he does not use drugs. ? ?No Known Allergies ? ?MEDICATIONS:                                                                                                                     ?  Prior to Admission:  ?Medications Prior to Admission  ?Medication Sig Dispense Refill Last Dose  ? aspirin EC 81 MG tablet Take 81 mg by mouth daily. Swallow whole.   06/03/2021  ? clotrimazole-betamethasone (LOTRISONE) cream Apply topically 2 (two) times daily. Apply to foreskin (Patient taking differently: Apply 1 application. topically 2 (two) times daily.) 45 g 4 unknown  ? gabapentin (NEURONTIN) 300 MG capsule Take 1 capsule (300 mg total) by mouth at bedtime. (Patient taking differently: Take 300 mg by mouth daily.) 30 capsule 3 06/03/2021  ? HUMALOG KWIKPEN 100 UNIT/ML KwikPen Inject 3 Units into the skin 3 (three) times daily before meals. 15 mL 3 06/03/2021  ? Insulin Glargine (BASAGLAR KWIKPEN) 100 UNIT/ML Inject 28 Units into the skin daily. 15 mL 2 06/03/2021  ? metFORMIN (GLUCOPHAGE) 1000 MG tablet TAKE 1 TABLET (1,000 MG TOTAL) BY MOUTH 2 (TWO) TIMES DAILY WITH A MEAL. 60 tablet 0 06/04/2021  ? oxybutynin (DITROPAN) 5 MG tablet Take 1 tablet (5 mg total) by mouth every 8 (eight) hours as needed for bladder spasms. 90 tablet 1 06/03/2021  ? pantoprazole (PROTONIX) 40 MG tablet Take 1 tablet (40 mg total) by mouth daily. Any generic PPI is okay to dispense  (Patient taking differently: Take 40 mg by mouth daily.) 30 tablet 1 06/03/2021  ? rosuvastatin (CRESTOR) 40 MG tablet Take 1 tablet (40 mg total) by mouth daily. 30 tablet 3 06/03/2021  ? silodosin (RAPAFLO) 8 MG CAPS capsule Take 1 capsule (8 mg total) by mouth daily. 30 capsule 11 06/03/2021  ? blood glucose meter kit and supplies Dispense based on patient and insurance preference. Use up to four times daily as directed. (FOR ICD-10 E10.9, E11.9). 1 each 11   ? Continuous Blood Gluc Sensor (FREESTYLE LIBRE 2 SENSOR) MISC 3 Units by Does not apply route 3 (three) times daily with meals. 1 each 3   ? ?Scheduled: ? aspirin EC  81 mg Oral Daily  ? Chlorhexidine Gluconate Cloth  6 each Topical Daily  ? feeding supplement  237 mL Oral BID BM  ? insulin aspart  0-5 Units Subcutaneous QHS  ? insulin aspart  0-9 Units Subcutaneous TID WC  ? insulin glargine-yfgn  28 Units Subcutaneous Daily  ? melatonin  3 mg Oral QHS  ? pantoprazole  40 mg Oral Daily  ? pneumococcal 20-valent conjugate vaccine  0.5 mL Intramuscular Tomorrow-1000  ? rosuvastatin  40 mg Oral Daily  ? sodium chloride flush  3 mL Intravenous Q12H  ? tamsulosin  0.4 mg Oral Daily  ? ?Continuous: ? lactated ringers 75 mL/hr at 06/05/21 1659  ? ? ? ?ROS:                                                                                                                                       ?Does not endorse any symptoms currently.  ? ? ?Blood pressure 103/68, pulse  79, temperature (!) 97.4 ?F (36.3 ?C), temperature source Oral, resp. rate 18, weight 56 kg, SpO2 99 %. ? ? ?General Examination:                                                                                                      ? ?Physical Exam  ?HEENT-  [place]/AT    ?Lungs- Respirations unlabored  ?Extremities- No edema ? ?Neurological Examination ?Mental Status: Awake and alert. Oriented x 5. Somewhat slowed responses to several questions, but answers correctly. Speech fluent with intact comprehension.  Able to name a thumb and pen, made error on pinky finger. Able to recite the months of the year forwards without difficulty; able to recite months backwards to April, then slowed significantly and made 2 errors. Able to perform simple math without errors.  ?Cranial Nerves: ?II: Temporal visual fields intact with no extinction to DSS. PERRL.  ?III,IV, VI: No ptosis. EOMI. No nystagmus.  ?V,VII: Temp sensation intact bilaterally. Smile symmetric.  ?VIII: Hearing intact to voice ?IX,X: No hypophonia or hoarseness ?XI: Symmetric ?XII: Midline tongue extension ?Motor: ?Right : Upper extremity   5/5    Left:     Upper extremity   5/5 ? Lower extremity   5/5     Lower extremity   5/5 ?Normal tone throughout; no atrophy noted ?Sensory: Temp and light touch intact throughout, bilaterally. No extinction to DSS.  ?Deep Tendon Reflexes: 2+ and symmetric throughout ?Plantars: Right: downgoing   Left: downgoing ?Cerebellar: No ataxia with FNF bilaterally  ?Gait: Deferred ?  ?Lab Results: ?Basic Metabolic Panel: ?Recent Labs  ?Lab 06/04/21 ?1444 06/04/21 ?1510 06/05/21 ?0213  ?NA 142 137 139  ?K 2.8* 2.8* 3.7  ?CL 111  --  107  ?CO2 25  --  27  ?GLUCOSE 26*  --  142*  ?BUN 8  --  8  ?CREATININE 0.53*  --  0.72  ?CALCIUM 8.5*  --  8.8*  ?MG 1.3*  --   --   ? ? ?CBC: ?Recent Labs  ?Lab 06/04/21 ?1444 06/04/21 ?1510 06/05/21 ?0213  ?WBC 5.2  --  7.2  ?NEUTROABS 4.2  --   --   ?HGB 11.1* 9.5* 11.6*  ?HCT 31.4* 28.0* 32.7*  ?MCV 86.5  --  85.8  ?PLT PLATELET CLUMPS NOTED ON SMEAR, UNABLE TO ESTIMATE  --  150  ? ? ?Cardiac Enzymes: ?No results for input(s): CKTOTAL, CKMB, CKMBINDEX, TROPONINI in the last 168 hours. ? ?Lipid Panel: ?No results for input(s): CHOL, TRIG, HDL, CHOLHDL, VLDL, LDLCALC in the last 168 hours. ? ?Imaging: ?CT Head Wo Contrast ? ?Result Date: 06/04/2021 ?CLINICAL DATA:  Altered mental status EXAM: CT HEAD WITHOUT CONTRAST TECHNIQUE: Contiguous axial images were obtained from the base of the skull through the  vertex without intravenous contrast. RADIATION DOSE REDUCTION: This exam was performed according to the departmental dose-optimization program which includes automated exposure control, adjustment of the mA and/or k

## 2021-06-05 NOTE — Evaluation (Signed)
Physical Therapy Evaluation Patient Details Name: Charles [name] MRN: [id number] DOB: [birth year] Today's Date: 06/05/2021  History of Present Illness  61 y.o. male presenting via after being found unresponsive by a neighbor 06/04/21. Glucose in the 70s in the field and 20s in ED. Given D50 ampule.  BP in the 100s to 110 systolic and pulse in 50s to 60s.  Admitted for observation. PMH: with medical history significant of hypertension, CAD status post CABG, BPH with history of obstruction, diabetes, hyperlipidemia  Clinical Impression  PTA pt reports living alone in single story condo with 4 steps to enter. Pt reports limited community distance ambulation with RW, neighbor drives to appointments. Pt is independent with ADLs, has food service and is not doing well with medication management. Pt has poor insight into his deficits, continually checking with therapist about his ability to go home today. Pt reports girlfriend and sister are moving in and that neighbors are able to provide all the support he needs, however he does not have 24 hour care if he leaves today. Pt requires min A for bed mobility, modA for transfers and mod-maxA for short distance ambulation with RW. Pt has multiple LoB requiring maxA for steadying. Pt reports that his mobility has been declining since fall 1-2 weeks ago. PT recommending SNF level rehab at discharge that pt reports he will refuse. PT will continue to follow acutely.       Recommendations for follow up therapy are one component of a multi-disciplinary discharge planning process, led by the attending physician.  Recommendations may be updated based on patient status, additional functional criteria and insurance authorization.  Follow Up Recommendations Skilled nursing-short term rehab (<3 hours/day)    Assistance Recommended at Discharge Frequent or constant Supervision/Assistance  Patient can return home with the following  A lot of help with walking and/or  transfers;A lot of help with bathing/dressing/bathroom;Assistance with cooking/housework;Assistance with feeding;Direct supervision/assist for medications management;Direct supervision/assist for financial management;Assist for transportation;Help with stairs or ramp for entrance    Equipment Recommendations BSC/3in1  Recommendations for Other Services  OT consult    Functional Status Assessment Patient has had a recent decline in their functional status and demonstrates the ability to make significant improvements in function in a reasonable and predictable amount of time.     Precautions / Restrictions Precautions Precautions: Fall Precaution Comments: found down Restrictions Weight Bearing Restrictions: No      Mobility  Bed Mobility Overal bed mobility: Needs Assistance Bed Mobility: Supine to Sit     Supine to sit: Mod assist     General bed mobility comments: pt struggling with use of bedrail to come to EoB with HoB elevated, reports standard bed at home so flattened bed and with maximal use of bed rail pt still requires therapist hand to pull up on and requires modA for scooting hips to EoB    Transfers Overall transfer level: Needs assistance Equipment used: Rolling walker (2 wheels) Transfers: Sit to/from Stand Sit to Stand: Mod assist           General transfer comment: able to initiate power up with one hand on bed surface and one on RW, once pt has powered up, relies heavily on posterior support on LE from bed to maintain balance and when he steps away from the bed requires modA to gain his balance, able to power up from toilet with use of grab bars by toilet, needs min A for transferring balance to be able to steady at [employer]  Ambulation/Gait Ambulation/Gait assistance: Mod assist, Max assist Gait Distance (Feet): 20 Feet (1x 10, 1x 20) Assistive device: Rolling walker (2 wheels) Gait Pattern/deviations: Step-to pattern, Step-through pattern, Decreased step  length - right, Decreased step length - left, Knee flexed in stance - right, Knee flexed in stance - left, Shuffle, Festinating, Trunk flexed, Narrow base of support Gait velocity: slowed Gait velocity interpretation: <1.31 ft/sec, indicative of household ambulator   General Gait Details: pt has decreased ability to initiate gait and when he does takes very short steps with decreased BoS unless he recieves maximal cuing for increasing step length and BoS, often BoS so narrow that pt looses balance requiring mod-maxA for steadying and maximal multimodal cues to fix deficits, decreased ability to stay within RW, and difficulty advancing gait with IV pole in front of him, pt has very poor awareness of how unsafe his gait is, notes reference possible Parkinson's and gait deficits are indicative of this type of diagnosis      Balance Overall balance assessment: Needs assistance Sitting-balance support: Feet supported, Bilateral upper extremity supported, Single extremity supported, Feet unsupported Sitting balance-Leahy Scale: Fair     Standing balance support: Bilateral upper extremity supported, During functional activity, Reliant on assistive device for balance Standing balance-Leahy Scale: Poor Standing balance comment: requires B UE support to maintain balance                             Pertinent Vitals/Pain Pain Assessment Pain Assessment: No/denies pain    Home Living Family/patient expects to be discharged to:: Private residence Living Arrangements: Alone Available Help at Discharge: Neighbor;Available PRN/intermittently Type of Home: Apartment Home Access: Stairs to enter   Entrance Stairs-Number of Steps: 4   Home Layout: One level Home Equipment: Rollator (4 wheels);Rolling Walker (2 wheels);Grab bars - toilet;Grab bars - tub/shower      Prior Function Prior Level of Function : Independent/Modified Independent             Mobility Comments: states he uses  a Rollator in the house and a RW in public, neaighbors assist with transportation ADLs Comments: reports independence with bathing and dressing, has a meal service that provides meals     Hand Dominance   Dominant Hand: Right    Extremity/Trunk Assessment   Upper Extremity Assessment Upper Extremity Assessment: Defer to OT evaluation    Lower Extremity Assessment Lower Extremity Assessment: Generalized weakness    Cervical / Trunk Assessment Cervical / Trunk Assessment: Kyphotic  Communication   Communication: No difficulties  Cognition Arousal/Alertness: Awake/alert Behavior During Therapy: Flat affect, Anxious Overall Cognitive Status: Impaired/Different from baseline Area of Impairment: Following commands, Safety/judgement, Awareness, Problem solving                       Following Commands: Follows one step commands with increased time, Follows multi-step commands with increased time, Follows multi-step commands inconsistently Safety/Judgement: Decreased awareness of safety, Decreased awareness of deficits Awareness: Emergent Problem Solving: Slow processing, Decreased initiation, Difficulty sequencing, Requires verbal cues, Requires tactile cues General Comments: very anxious to get home, does not like to accept assistance despite increased physical assistance needed for safe mobility, has poor initation requires increased time and maximal multimodal cuing for task completion,        General Comments General comments (skin integrity, edema, etc.): VSS on RA,        Assessment/Plan    PT Assessment Patient needs continued  PT services  PT Problem List Decreased strength;Decreased balance;Decreased mobility;Decreased cognition;Decreased coordination;Decreased knowledge of use of DME;Decreased safety awareness;Decreased knowledge of precautions       PT Treatment Interventions DME instruction;Gait training;Stair training;Functional mobility  training;Therapeutic activities;Therapeutic exercise;Balance training;Cognitive remediation;Patient/family education    PT Goals (Current goals can be found in the Care Plan section)  Acute Rehab PT Goals Patient Stated Goal: go home today PT Goal Formulation: With patient Time For Goal Achievement: 06/19/21 Potential to Achieve Goals: Fair    Frequency Min 2X/week        AM-PAC PT "6 Clicks" Mobility  Outcome Measure Help needed turning from your back to your side while in a flat bed without using bedrails?: None Help needed moving from lying on your back to sitting on the side of a flat bed without using bedrails?: A Little Help needed moving to and from a bed to a chair (including a wheelchair)?: A Lot Help needed standing up from a chair using your arms (e.g., wheelchair or bedside chair)?: A Lot Help needed to walk in hospital room?: Total Help needed climbing 3-5 steps with a railing? : Total 6 Click Score: 13    End of Session Equipment Utilized During Treatment: Gait belt Activity Tolerance: Patient tolerated treatment well Patient left: in chair;with call bell/phone within reach;with chair alarm set Nurse Communication: Mobility status;Other (comment) (increased safety awareness deficits) PT Visit Diagnosis: Unsteadiness on feet (R26.81);Other abnormalities of gait and mobility (R26.89);Muscle weakness (generalized) (M62.81);History of falling (Z91.81);Difficulty in walking, not elsewhere classified (R26.2);Other symptoms and signs involving the nervous system (R29.898);Adult, failure to thrive (R62.7)    Time: 1005-1028 PT Time Calculation (min) (ACUTE ONLY): 23 min   Charges:   PT Evaluation $PT Eval Moderate Complexity: 1 Mod PT Treatments $Gait Training: 8-22 mins        Elveria Lauderbaugh B. Beverely Risen PT, DPT Acute Rehabilitation Services Pager [phone] Office [phone]   Elon Alas Fleet 06/05/2021, 11:23 AM

## 2021-06-05 NOTE — Evaluation (Signed)
Occupational Therapy Evaluation ?Patient Details ?Name: Charles [name] ?MRN: [id number] ?DOB: 1961/02/24 ?Today's Date: 06/05/2021 ? ? ?History of Present Illness 61 y.o. male presenting via after being found unresponsive by a neighbor 06/04/21. Glucose in the 70s in the field and 20s in ED. Given D50 ampule.  BP in the 100s to 110 systolic and pulse in 50s to 60s.  Admitted for observation. PMH: with medical history significant of hypertension, CAD status post CABG, BPH with history of obstruction, diabetes, hyperlipidemia  ? ?Clinical Impression ?  ?Mel was evaluated s/p the above admission list, he reports being generally mod I PTA. He does not drive, but manages his medication and finances, he lives alone with support of neighbors near by who can assist as needed. He reports he girlfriend, nephew or sister can stay with him at d/c. Overall pt required set up for all sitting ADLs, and close min G for LB tasks and functional ambulation with a RW. He was quite unsteady with a narrow BOS, no overt LOB however required cues fro safety, problem solving and fall prevention. He will benefit from OT acutely. Recommend d/c to SNF for continued therapies. However, pt likely to refuse and he will need HHOT.  ?   ? ?Recommendations for follow up therapy are one component of a multi-disciplinary discharge planning process, led by the attending physician.  Recommendations may be updated based on patient status, additional functional criteria and insurance authorization.  ? ?Follow Up Recommendations ? Skilled nursing-short term rehab (<3 hours/day) (pt likely to refuse; if d/c home he will need 24/7 direct supervision and physical assistance for safety and to prevent falls.)  ?  ?Assistance Recommended at Discharge Frequent or constant Supervision/Assistance  ?Patient can return home with the following A little help with walking and/or transfers;A little help with bathing/dressing/bathroom;A lot of help with  bathing/dressing/bathroom;Assistance with cooking/housework;Direct supervision/assist for medications management;Direct supervision/assist for financial management;Help with stairs or ramp for entrance;Assist for transportation ? ?  ?Functional Status Assessment ? Patient has had a recent decline in their functional status and demonstrates the ability to make significant improvements in function in a reasonable and predictable amount of time.  ?Equipment Recommendations ? Other (comment) (RW)  ?  ?   ?Precautions / Restrictions Precautions ?Precautions: Fall ?Precaution Comments: found down ?Restrictions ?Weight Bearing Restrictions: No  ? ?  ? ?Mobility Bed Mobility ?  ?  ?  ?  ?  ?  ?  ?General bed mobility comments: pt in chair upon arrival ?  ? ?Transfers ?Overall transfer level: Needs assistance ?Equipment used: Rolling walker (2 wheels) ?Transfers: Sit to/from Stand ?Sit to Stand: Min guard ?  ?  ?  ?  ?  ?General transfer comment: no physical assist to power up from the chair, close min guard for unsteadiness ?  ? ?  ?Balance Overall balance assessment: Needs assistance ?Sitting-balance support: Feet supported, Bilateral upper extremity supported, Single extremity supported, Feet unsupported ?Sitting balance-Leahy Scale: Fair ?  ?  ?Standing balance support: Bilateral upper extremity supported, During functional activity, Reliant on assistive device for balance ?Standing balance-Leahy Scale: Poor ?Standing balance comment: requires B UE support to maintain balance ?  ?  ?  ?  ?  ?  ?  ?  ?  ?  ?  ?   ? ?ADL either performed or assessed with clinical judgement  ? ?ADL Overall ADL's : Needs assistance/impaired ?Eating/Feeding: Independent;Sitting ?  ?Grooming: Min guard;Standing ?Grooming Details (indicate cue type and reason): at the  sink ?Upper Body Bathing: Set up;Sitting ?  ?Lower Body Bathing: Min guard;Sit to/from stand ?  ?Upper Body Dressing : Set up;Sitting ?  ?Lower Body Dressing: Min guard;Sit  to/from stand ?  ?Toilet Transfer: Min guard;Ambulation;Rolling walker (2 wheels) ?  ?Toileting- Clothing Manipulation and Hygiene: Supervision/safety;Sitting/lateral lean ?  ?  ?  ?Functional mobility during ADLs: Min guard;Rolling walker (2 wheels);Cueing for safety;Cueing for sequencing ?General ADL Comments: cues for safety and problem solving, no overt LOB, close min G due to unsteady gait and narrow BOS  ? ? ? ?Vision Baseline Vision/History: 0 No visual deficits ?Ability to See in Adequate Light: 0 Adequate ?Vision Assessment?: No apparent visual deficits  ?   ? ? ?Hand Dominance Right ?  ?Extremity/Trunk Assessment Upper Extremity Assessment ?Upper Extremity Assessment: Defer to OT evaluation ?  ?Lower Extremity Assessment ?Lower Extremity Assessment: Generalized weakness ?  ?Cervical / Trunk Assessment ?Cervical / Trunk Assessment: Kyphotic ?  ?Communication Communication ?Communication: No difficulties ?  ?Cognition Arousal/Alertness: Awake/alert ?Behavior During Therapy: Flat affect, Anxious ?Overall Cognitive Status: Impaired/Different from baseline ?Area of Impairment: Following commands, Safety/judgement, Awareness, Problem solving ?  ?  ?  ?  ?  ?  ?  ?  ?  ?  ?  ?Following Commands: Follows one step commands with increased time, Follows multi-step commands with increased time, Follows multi-step commands inconsistently ?Safety/Judgement: Decreased awareness of safety, Decreased awareness of deficits ?Awareness: Emergent ?Problem Solving: Slow processing, Decreased initiation, Difficulty sequencing, Requires verbal cues, Requires tactile cues ?General Comments: anxious to get home; provided conflicting information throughout about available assistance at d/c. poor insgiht to safety and deficits ?  ?  ?General Comments  VSS onRa ? ?  ?   ?   ? ? ?Home Living Family/patient expects to be discharged to:: Private residence ?Living Arrangements: Alone ?Available Help at Discharge: Neighbor;Available  PRN/intermittently ?Type of Home: Apartment ?Home Access: Stairs to enter ?Entrance Stairs-Number of Steps: 4 ?  ?Home Layout: One level ?  ?  ?Bathroom Shower/Tub: Walk-in shower ?  ?Bathroom Toilet: Standard ?Bathroom Accessibility: Yes ?  ?Home Equipment: Rollator (4 wheels);Rolling Walker (2 wheels);Grab bars - toilet;Grab bars - tub/shower ?  ?Additional Comments: pt states his girlfriend is coming from [place] this week and he has a nephew and a sister who can stay wtih him - inconsistent information given throughout, unsure of accuracy ?  ? ?  ?Prior Functioning/Environment Prior Level of Function : Independent/Modified Independent ?  ?  ?  ?  ?  ?  ?Mobility Comments: states he uses a Rollator in the house and a RW in public, neaighbors assist with transportation ?ADLs Comments: reports independence with bathing and dressing, has a meal service that provides meals ?  ? ?  ?  ?OT Problem List: Decreased strength;Decreased activity tolerance;Decreased range of motion;Impaired balance (sitting and/or standing);Decreased coordination;Decreased safety awareness;Decreased knowledge of use of DME or AE;Decreased knowledge of precautions ?  ?   ?OT Treatment/Interventions: Self-care/ADL training;Therapeutic exercise;DME and/or AE instruction;Balance training;Patient/family education;Therapeutic activities  ?  ?OT Goals(Current goals can be found in the care plan section) Acute Rehab OT Goals ?Patient Stated Goal: home ?OT Goal Formulation: With patient ?Time For Goal Achievement: 06/19/21 ?Potential to Achieve Goals: Good ?ADL Goals ?Pt Will Perform Lower Body Dressing: with modified independence;sit to/from stand ?Pt Will Transfer to Toilet: with modified independence;ambulating ?Additional ADL Goal #1: Pt will indep recall at least 3 fall precautions to apply to the home setting to increase safety ?Additional ADL  Goal #2: Pt will indep complete IADL medication mgmt task  ?OT Frequency: Min 2X/week ?  ? ?   ?AM-PAC OT  "6 Clicks" Daily Activity     ?Outcome Measure Help from another person eating meals?: None ?Help from another person taking care of personal grooming?: A Little ?Help from another person toileting, which includes using toliet, bed

## 2021-06-05 NOTE — Discharge Instructions (Signed)
Follow with Primary MD [name], Jessica C, MD in 7 days   Get CBC, CMP, checked  by Primary MD next visit.    Activity: As tolerated with Full fall precautions use walker/cane & assistance as needed   Disposition Home    Diet: Heart Healthy /carb modified   On your next visit with your primary care physician please Get Medicines reviewed and adjusted.   Please request your Prim.MD to go over all Hospital Tests and Procedure/Radiological results at the follow up, please get all Hospital records sent to your Prim MD by signing hospital release before you go home.   If you experience worsening of your admission symptoms, develop shortness of breath, life threatening emergency, suicidal or homicidal thoughts you must seek medical attention immediately by calling 911 or calling your MD immediately  if symptoms less severe.  You Must read complete instructions/literature along with all the possible adverse reactions/side effects for all the Medicines you take and that have been prescribed to you. Take any new Medicines after you have completely understood and accpet all the possible adverse reactions/side effects.   Do not drive, operating heavy machinery, perform activities at heights, swimming or participation in water activities or provide baby sitting services if your were admitted for syncope or siezures until you have seen by Primary MD or a Neurologist and advised to do so again.  Do not drive when taking Pain medications.    Do not take more than prescribed Pain, Sleep and Anxiety Medications  Special Instructions: If you have smoked or chewed Tobacco  in the last 2 yrs please stop smoking, stop any regular Alcohol  and or any Recreational drug use.  Wear Seat belts while driving.   Please note  You were cared for by a hospitalist during your hospital stay. If you have any questions about your discharge medications or the care you received while you were in the hospital after  you are discharged, you can call the unit and asked to speak with the hospitalist on call if the hospitalist that took care of you is not available. Once you are discharged, your primary care physician will handle any further medical issues. Please note that NO REFILLS for any discharge medications will be authorized once you are discharged, as it is imperative that you return to your primary care physician (or establish a relationship with a primary care physician if you do not have one) for your aftercare needs so that they can reassess your need for medications and monitor your lab values. 

## 2021-06-05 NOTE — TOC Initial Note (Addendum)
Transition of Care (TOC) - Initial/Assessment Note  ? ? ?Patient Details  ?Name: Charles [name] ?MRN: [id number] ?Date of Birth: 04/07/1960 ? ?Transition of Care (TOC) CM/SW Contact:    ?Carles Collet, RN ?Phone Number: ?06/05/2021, 11:55 AM ? ?Clinical Narrative:              Referral made to [facility]) from previous admission on February 13th. Reached out to liaison to assess why [facility] services were not started.    ? ?[facility] states that they can accept patient for [facility] services, excluding aid (removed from order) if he decides to DC to home.  ? ? ?Expected Discharge Plan: [place] ?Barriers to Discharge: Continued Medical Work up ? ? ?Patient Goals and CMS Choice ?  ?  ?  ? ?Expected Discharge Plan and Services ?Expected Discharge Plan: [place] ?  ?  ?  ?  ?                ?  ?  ?  ?  ?  ?  ?  ?  ?  ?  ? ?Prior Living Arrangements/Services ?  ?  ?  ?       ?  ?  ?  ?  ? ?Activities of Daily Living ?Home Assistive Devices/Equipment: Grab bars in shower, CBG Meter, Grab bars around toilet, Walker (specify type) ?ADL Screening (condition at time of admission) ?Patient's cognitive ability adequate to safely complete daily activities?: Yes ?Is the patient deaf or have difficulty hearing?: No ?Does the patient have difficulty seeing, even when wearing glasses/contacts?: No ?Does the patient have difficulty concentrating, remembering, or making decisions?: No ?Patient able to express need for assistance with ADLs?: Yes ?Does the patient have difficulty dressing or bathing?: No ?Independently performs ADLs?: Yes (appropriate for developmental age) ?Does the patient have difficulty walking or climbing stairs?: Yes ?Weakness of Legs: Both ?Weakness of Arms/Hands: Both ? ?Permission Sought/Granted ?  ?  ?   ?   ?   ?   ? ?Emotional Assessment ?  ?  ?  ?  ?  ?  ? ?Admission diagnosis:  Hypoglycemia [E16.2] ?Amnesia [R41.3] ?Unresponsive [R41.89] ?Acute encephalopathy  [G93.40] ?Patient Active Problem List  ? Diagnosis Date Noted  ? Acute encephalopathy 06/04/2021  ? Diarrhea 04/24/2021  ? Acute urinary retention 04/23/2021  ? Nausea & vomiting 04/22/2021  ? Dehydration 04/22/2021  ? Acute prerenal azotemia 04/22/2021  ? Leukocytosis 04/22/2021  ? HLD (hyperlipidemia) 04/22/2021  ? Acute metabolic encephalopathy 0000000  ? DKA (diabetic ketoacidosis) ([place]) 04/21/2021  ? 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/23/2021  ? Type 2 diabetes mellitus with complication, with long-term current use of insulin ([place]) 03/23/2021  ? BPH with obstruction/lower urinary tract symptoms 02/08/2021  ? Phimosis 01/27/2021  ? Hypertension 12/23/2020  ? Coronary artery disease involving native coronary artery of native heart without angina pectoris 12/23/2020  ? ?PCP:  Darreld Mclean, MD ?Pharmacy:   ?[id number] - [place], [place] [address] ?[place] [address] ?Phone: [phone] Fax: [phone] ? ? ? ? ?Social Determinants of Health (SDOH) Interventions ?  ? ?Readmission Risk Interventions ?   ? View : No data to display.  ?  ?  ?  ? ? ? ?

## 2021-06-05 NOTE — TOC Progression Note (Signed)
Transition of Care (TOC) - Progression Note  ? ? ?Patient Details  ?Name: Charles [name] ?MRN: [id number] ?Date of Birth: [birth year] ? ?Transition of Care (TOC) CM/SW Contact  ?Joanne Chars, LCSW ?Phone Number: ?06/05/2021, 12:41 PM ? ?Clinical Narrative:   CSW met with pt regarding DC recommendation for SNF.  Pt agreeable to this, says "I need it", choice document given, permission given to send out referral in hub and also to talk with sister [name] and girlfriend Suanne Marker [phone].  Pt mentioned desire to be DC today, CSW talked about SNF timeframe, need for insurance approval, pt voices understanding.   ? Referral sent out in hub.  ? ? ?Expected Discharge Plan: [place] ?Barriers to Discharge: Continued Medical Work up ? ?Expected Discharge Plan and Services ?Expected Discharge Plan: [place] ?  ?  ?  ?  ?                ?  ?  ?  ?  ?  ?  ?  ?  ?  ?  ? ? ?Social Determinants of Health (SDOH) Interventions ?  ? ?Readmission Risk Interventions ?   ? View : No data to display.  ?  ?  ?  ? ? ?

## 2021-06-05 NOTE — Progress Notes (Signed)
?PROGRESS NOTE ? ? ? ?Charles [name]  YTW:[id number] DOB: 1960-05-08 DOA: 06/04/2021 ?PCP: Pearline Cables, MD  ? ?No chief complaint on file. ? ? ?Brief Narrative:  ? ?Charles [name] is a 61 y.o. male with medical history significant of hypertension, CAD status post CABG, BPH with history of obstruction, diabetes, hyperlipidemia presenting after being found unresponsive. ?-His friend sent police to check on him as he did not answer his phone call, he was found by EMS to be unresponsive on the floor, he was given 1 dose of Versed as he was found to be stiff, but no seizures were noted, his work-up was significant for hypoglycemia where he was as low as 23 while in ED, apparently patient did take the wrong dose of his long-acting insulin, so he was admitted for further work-up. ? ?Assessment & Plan: ?  ?Principal Problem: ?  Acute encephalopathy ?Active Problems: ?  Hypertension ?  Coronary artery disease involving native coronary artery of native heart without angina pectoris ?  BPH with obstruction/lower urinary tract symptoms ?  Hx of CABG ?  Type 2 diabetes mellitus with complication, with long-term current use of insulin (HCC) ?  HLD (hyperlipidemia) ? ? ?Acute encephalopathy ?Hypoglycemia ?- Patient found unresponsive by neighbors last seen normal 2 days ago.  Noted to have CBG in the 70s on the scene but was in the 20s in the ED.  Received amp of D50 with some improvement in mentation but not all the way back to normal. ?- CT head showed no acute normality did demonstrate prominent lateral and third ventricles and a 5 mm likely colloid cyst.  This finding was similar to 1 month ago. ?-No further hypoglycemia during hospital stay. ?-Mentation back to baseline ?-Vitamin B12 was 700 during recent visit. ?-Patient does appear to be having some intermittent confusion, as well he is with unsteady gait, cannot specify any history of urinary incontinence and given he had Foley inserted recently for BPH and urinary  retention, I did request neurology to evaluate for normal pressure hydrocephalus.. ? ?Hypokalemia ?-Low potassium 2.8 while in ED, this was repleted. ? ?Diabetes mellitus type II, poorly controlled, insulin-dependent ?-  Presented with altered mental status and hypoglycemia as above.  Recently admitted for DKA and encephalopathy. ?-Patient was discharged on glargine 28 units, he used to be on 55 units in the past, apparently he took 55 units yesterday.. ?- Reportedly glucose was 70s in the field, 20s on arrival to the ED, now trending from 200s to 100s. ?-Patient has been started on Semglee 28 units during hospital stay and insulin sliding scale. ? ?CAD ?Hyperlipidemia ?- Status post CABG ?- Continue home aspirin and rosuvastatin ? ?BPH history of obstruction ?-History of obstruction with prior renal failure. ?> Foley catheter re--placed March 9th ?- Continue home oxybutynin and replace home silodosin with formulary Flomax ?-Patient is following with Dr. Ronne Binning, from urology, and being planned for urodynamic study. ?  ?Weakness ? Nursing facility recommended after recent admission.  Patient declined but did agree to home health. ?-Patient with unsteady gait, he will be evaluated by neurology today as well.  Has been seen by PT/OT. ? ?Hypomagnesemia ?-Repleted ? ? ?DVT prophylaxis: He is on Lovenox which I held today in case he will need any procedures by neurology for further work-up. ?Code Status: Full ?Family Communication: I was unable to reach his sister, but discussed with his friend Bjorn Loser. ?Disposition:  ? ?Status is: Observation ? ?  ?Consultants:  ?NEUROLOGY ? ? ?Subjective: ? ? ? ?  Objective: ?Vitals:  ? 06/05/21 0204 06/05/21 0341 06/05/21 0757 06/05/21 1113  ?BP:  131/84 103/64 115/63  ?Pulse: 90 83 77 76  ?Resp: 19 17 20 12   ?Temp:  97.8 ?F (36.6 ?C) 97.6 ?F (36.4 ?C) (!) 97.5 ?F (36.4 ?C)  ?TempSrc:  Oral Oral Oral  ?SpO2: 98% 99% 99%   ?Weight: 56 kg     ? ? ?Intake/Output Summary (Last 24 hours) at  06/05/2021 1423 ?Last data filed at 06/05/2021 0945 ?Gross per 24 hour  ?Intake 860 ml  ?Output 950 ml  ?Net -90 ml  ? ?Filed Weights  ? 06/04/21 2230 06/04/21 2322 06/05/21 0204  ?Weight: 56 kg 56 kg 56 kg  ? ? ?Examination: ? ?Awake Alert, Oriented X 3,  ?Symmetrical Chest wall movement, Good air movement bilaterally, CTAB ?RRR,No Gallops,Rubs or new Murmurs, No Parasternal Heave ?+ve B.Sounds, Abd Soft, No tenderness, No rebound - guarding or rigidity. ?No Cyanosis, Clubbing or edema, No new Rash or bruise   ? ? ? ?Data Reviewed: I have personally reviewed following labs and imaging studies ? ?CBC: ?Recent Labs  ?Lab 06/04/21 ?1444 06/04/21 ?1510 06/05/21 ?0213  ?WBC 5.2  --  7.2  ?NEUTROABS 4.2  --   --   ?HGB 11.1* 9.5* 11.6*  ?HCT 31.4* 28.0* 32.7*  ?MCV 86.5  --  85.8  ?PLT PLATELET CLUMPS NOTED ON SMEAR, UNABLE TO ESTIMATE  --  150  ? ? ?Basic Metabolic Panel: ?Recent Labs  ?Lab 06/04/21 ?1444 06/04/21 ?1510 06/05/21 ?0213  ?NA 142 137 139  ?K 2.8* 2.8* 3.7  ?CL 111  --  107  ?CO2 25  --  27  ?GLUCOSE 26*  --  142*  ?BUN 8  --  8  ?CREATININE 0.53*  --  0.72  ?CALCIUM 8.5*  --  8.8*  ?MG 1.3*  --   --   ? ? ?GFR: ?Estimated Creatinine Clearance: 79.7 mL/min (by C-G formula based on SCr of 0.72 mg/dL). ? ?Liver Function Tests: ?Recent Labs  ?Lab 06/04/21 ?1444 06/05/21 ?0213  ?AST 31 36  ?ALT 33 37  ?ALKPHOS 57 63  ?BILITOT 0.6 0.4  ?PROT 5.1* 5.4*  ?ALBUMIN 2.6* 2.9*  ? ? ?CBG: ?Recent Labs  ?Lab 06/05/21 ?0026 06/05/21 ?06/07/21 06/05/21 ?06/07/21 06/05/21 ?0759 06/05/21 ?1134  ?GLUCAP 119* 228* 162* 159* 192*  ? ? ? ?No results found for this or any previous visit (from the past 240 hour(s)).  ? ? ? ? ? ?Radiology Studies: ?CT Head Wo Contrast ? ?Result Date: 06/04/2021 ?CLINICAL DATA:  Altered mental status EXAM: CT HEAD WITHOUT CONTRAST TECHNIQUE: Contiguous axial images were obtained from the base of the skull through the vertex without intravenous contrast. RADIATION DOSE REDUCTION: This exam was performed  according to the departmental dose-optimization program which includes automated exposure control, adjustment of the mA and/or kV according to patient size and/or use of iterative reconstruction technique. COMPARISON:  04/22/2021 FINDINGS: Brain: No acute intracranial findings are seen. There is dilation of third and both lateral ventricles. There is 5 mm smooth marginated high density structure in the area of foramen of Monro with no interval change. There are possible small old lacunar infarcts in the right basal ganglia. Cortical sulci are prominent. There is decreased density in the periventricular white matter. Vascular: Unremarkable. Skull: No fracture is seen. Posterior bony spurs are noted at C2-C3 level, more so on the right side. Sinuses/Orbits: There is mucosal thickening in the ethmoid and both maxillary sinuses. Other: None IMPRESSION: No  acute intracranial findings are seen in noncontrast CT brain. There is prominence of third and both lateral ventricles. Cortical sulci are prominent suggesting atrophy. There is 5 mm smooth marginated high density structure at the level of foramen of Monro, possibly colloid cysts. Chronic sinusitis. Cervical spondylosis with posterior bony spurs causing extrinsic pressure over the ventral margin of thecal sac on the right side at C2-C3 level. Electronically Signed   By: Ernie AvenaPalani  Rathinasamy M.D.   On: 06/04/2021 15:56  ? ?DG Chest Port 1 View ? ?Result Date: 06/04/2021 ?CLINICAL DATA:  Short of breath.  Unresponsive. EXAM: PORTABLE CHEST 1 VIEW COMPARISON:  04/22/2021 FINDINGS: Stable changes from prior CABG surgery. Cardiac silhouette normal in size and configuration. Normal mediastinal and hilar contours. Clear lungs.  No pleural effusion or pneumothorax. Skeletal structures are grossly intact. IMPRESSION: No active disease. Electronically Signed   By: Amie Portlandavid  Ormond M.D.   On: 06/04/2021 15:04   ? ? ? ? ? ?Scheduled Meds: ? aspirin EC  81 mg Oral Daily  ? feeding  supplement  237 mL Oral BID BM  ? insulin aspart  0-5 Units Subcutaneous QHS  ? insulin aspart  0-9 Units Subcutaneous TID WC  ? insulin glargine-yfgn  28 Units Subcutaneous Daily  ? melatonin  3 mg Oral QHS  ? pan

## 2021-06-05 NOTE — [place] FL2 (Signed)
?[place] MEDICAID FL2 LEVEL OF CARE SCREENING TOOL  ?  ? ?IDENTIFICATION  ?Patient Name: ?Charles [name] Birthdate: 09-28-1960 Sex: male Admission Date (Current Location): ?06/04/2021  ?[place] Number: ? [place] ?  Facility and Address:  ?[facility], [address] [place], [place] [address] ?     Provider Number: ?[id number]  ?Attending Physician Name and Address:  ?Elgergawy, Leana Roe, MD ? Relative Name and Phone Number:  ?Lizbeth Bark Sister   [phone] ?   ?Current Level of Care: ?Hospital Recommended Level of Care: ?Skilled Nursing Facility Prior Approval Number: ?  ? ?Date Approved/Denied: ?  PASRR Number: ?[id number] ? ?Discharge Plan: ?SNF ?  ? ?Current Diagnoses: ?Patient Active Problem List  ? Diagnosis Date Noted  ? Acute encephalopathy 06/04/2021  ? Diarrhea 04/24/2021  ? Acute urinary retention 04/23/2021  ? Nausea & vomiting 04/22/2021  ? Dehydration 04/22/2021  ? Acute prerenal azotemia 04/22/2021  ? Leukocytosis 04/22/2021  ? HLD (hyperlipidemia) 04/22/2021  ? Acute metabolic encephalopathy 04/22/2021  ? DKA (diabetic ketoacidosis) (HCC) 04/21/2021  ? 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/23/2021  ? Type 2 diabetes mellitus with complication, with long-term current use of insulin (HCC) 03/23/2021  ? BPH with obstruction/lower urinary tract symptoms 02/08/2021  ? Phimosis 01/27/2021  ? Hypertension 12/23/2020  ? Coronary artery disease involving native coronary artery of native heart without angina pectoris 12/23/2020  ? ? ?Orientation RESPIRATION BLADDER Height & Weight   ?  ?Self, Time, Situation, Place ? Normal Continent, Indwelling catheter Weight: 123 lb 7.3 oz (56 kg) ?Height:     ?BEHAVIORAL SYMPTOMS/MOOD NEUROLOGICAL BOWEL NUTRITION STATUS  ?    Continent Diet (see discharge summary)  ?AMBULATORY STATUS COMMUNICATION OF NEEDS Skin   ?Extensive Assist Verbally Normal ?  ?  ?  ?    ?     ?     ? ? ?Personal Care Assistance Level of Assistance  ?Bathing, Feeding, Dressing  Bathing Assistance: Independent ?Feeding assistance: Independent ?Dressing Assistance: Independent ?   ? ?Functional Limitations Info  ?Sight, Hearing, Speech Sight Info: Adequate ?Hearing Info: Adequate ?Speech Info: Adequate  ? ? ?SPECIAL CARE FACTORS FREQUENCY  ?PT (By licensed PT), OT (By licensed OT)   ?  ?PT Frequency: 5x week ?OT Frequency: 5x week ?  ?  ?  ?   ? ? ?Contractures Contractures Info: Not present  ? ? ?Additional Factors Info  ?Code Status, Allergies, Insulin Sliding Scale Code Status Info: full ?Allergies Info: NKA ?  ?Insulin Sliding Scale Info: Novolog, 0-9 units TID with meals, 0-5 units QHS.  See discharge summary. ?  ?   ? ?Current Medications (06/05/2021):  This is the current hospital active medication list ?Current Facility-Administered Medications  ?Medication Dose Route Frequency Provider Last Rate Last Admin  ? acetaminophen (TYLENOL) tablet 650 mg  650 mg Oral Q6H PRN Synetta Fail, MD      ? Or  ? acetaminophen (TYLENOL) suppository 650 mg  650 mg Rectal Q6H PRN Synetta Fail, MD      ? aspirin EC tablet 81 mg  81 mg Oral Daily Synetta Fail, MD   81 mg at 06/05/21 1031  ? enoxaparin (LOVENOX) injection 40 mg  40 mg Subcutaneous Q24H Synetta Fail, MD   40 mg at 06/04/21 2152  ? feeding supplement (ENSURE ENLIVE / ENSURE PLUS) liquid 237 mL  237 mL Oral BID BM Synetta Fail, MD   237 mL at 06/05/21 1039  ?  insulin aspart (novoLOG) injection 0-5 Units  0-5 Units Subcutaneous QHS Elgergawy, Leana Roe, MD      ? insulin aspart (novoLOG) injection 0-9 Units  0-9 Units Subcutaneous TID WC Elgergawy, Leana Roe, MD      ? insulin glargine-yfgn (SEMGLEE) injection 28 Units  28 Units Subcutaneous Daily Elgergawy, Leana Roe, MD   28 Units at 06/05/21 1032  ? lactated ringers infusion   Intravenous Continuous Elgergawy, Leana Roe, MD 75 mL/hr at 06/05/21 0857 New Bag at 06/05/21 0857  ? magnesium sulfate IVPB 4 g 100 mL  4 g Intravenous Once Elgergawy, Leana Roe, MD       ? melatonin tablet 3 mg  3 mg Oral QHS Synetta Fail, MD   3 mg at 06/04/21 2348  ? naloxone [facility]) injection 0.4 mg  0.4 mg Intravenous PRN Synetta Fail, MD      ? oxybutynin (DITROPAN) tablet 5 mg  5 mg Oral Q8H PRN Synetta Fail, MD      ? pantoprazole (PROTONIX) EC tablet 40 mg  40 mg Oral Daily Synetta Fail, MD   40 mg at 06/05/21 1031  ? pneumococcal 20-valent conjugate vaccine (PREVNAR 20) injection 0.5 mL  0.5 mL Intramuscular Tomorrow-1000 Synetta Fail, MD      ? polyethylene glycol (MIRALAX / GLYCOLAX) packet 17 g  17 g Oral Daily PRN Synetta Fail, MD      ? rosuvastatin (CRESTOR) tablet 40 mg  40 mg Oral Daily Synetta Fail, MD   40 mg at 06/05/21 1031  ? sodium chloride flush (NS) 0.9 % injection 3 mL  3 mL Intravenous Q12H Synetta Fail, MD   3 mL at 06/05/21 1033  ? tamsulosin (FLOMAX) capsule 0.4 mg  0.4 mg Oral Daily Synetta Fail, MD   0.4 mg at 06/05/21 1031  ? ? ? ?Discharge Medications: ?Please see discharge summary for a list of discharge medications. ? ?Relevant Imaging Results: ? ?Relevant Lab Results: ? ? ?Additional Information ?SSN: [id number].  Pt is vaccinated for covid but not boosted. ? ?Lorri Frederick, LCSW ? ? ? ? ?

## 2021-06-05 NOTE — Discharge Summary (Incomplete)
Physician Discharge Summary  ?[name] MRN:[id number] DOB: 04/06/1960 DOA: 06/04/2021 ? ?PCP: [name], Jessica C, MD ? ?Admit date: 06/04/2021 ?Discharge date: 06/05/2021 ? ?Admitted From: Home ?Disposition:  Home (he declined SNF) ? ?Recommendations for Outpatient Follow-up:  ?Follow up with PCP in 1-2 weeks ?He is adjust insulin dose as needed ? ?Home Health:YES ? ?Discharge Condition:Stable ?CODE STATUS:FULL ?Diet recommendation: Carb Modified ? ?Brief/Interim Summary: ? ? Charles [name] is a 61 y.o. male with medical history significant of hypertension, CAD status post CABG, BPH with history of obstruction, diabetes, hyperlipidemia presenting after being found unresponsive. ? ?Patient found unresponsive by a neighbor who called EMS.  Last seen normal 2 days ago.  On EMS evaluation glucose was in the 70s, reportedly had rigid extremities but no other evidence of seizure but received Versed for possible seizure. Some relaxation after administration of Versed. ? ?Discharge Diagnoses:  ?Principal Problem: ?  Acute encephalopathy ?Active Problems: ?  Hypertension ?  Coronary artery disease involving native coronary artery of native heart without angina pectoris ?  BPH with obstruction/lower urinary tract symptoms ?  Hx of CABG ?  Type 2 diabetes mellitus with complication, with long-term current use of insulin (HCC) ?  HLD (hyperlipidemia) ? ? ? ?Discharge Instructions ? ?Discharge Instructions   ? ? Diet - low sodium heart healthy   Complete by: As directed ?  ? Discharge instructions   Complete by: As directed ?  ? Follow with Primary MD [name], Jessica C, MD in 7 days  ? ?Get CBC, CMP,  checked  by Primary MD next visit.  ? ? ?Activity: As tolerated with Full fall precautions use walker/cane & assistance as needed ? ?Disposition Home  ? ? ?Diet: Heart Healthy /carb modified ? ? ?On your next visit with your primary care physician please Get Medicines reviewed and adjusted. ? ? ?Please request your Prim.MD to go  over all Hospital Tests and Procedure/Radiological results at the follow up, please get all Hospital records sent to your Prim MD by signing hospital release before you go home. ? ? ?If you experience worsening of your admission symptoms, develop shortness of breath, life threatening emergency, suicidal or homicidal thoughts you must seek medical attention immediately by calling 911 or calling your MD immediately  if symptoms less severe. ? ?You Must read complete instructions/literature along with all the possible adverse reactions/side effects for all the Medicines you take and that have been prescribed to you. Take any new Medicines after you have completely understood and accpet all the possible adverse reactions/side effects.  ? ?Do not drive, operating heavy machinery, perform activities at heights, swimming or participation in water activities or provide baby sitting services if your were admitted for syncope or siezures until you have seen by Primary MD or a Neurologist and advised to do so again. ? ?Do not drive when taking Pain medications.  ? ? ?Do not take more than prescribed Pain, Sleep and Anxiety Medications ? ?Special Instructions: If you have smoked or chewed Tobacco  in the last 2 yrs please stop smoking, stop any regular Alcohol  and or any Recreational drug use. ? ?Wear Seat belts while driving. ? ? ?Please note ? ?You were cared for by a hospitalist during your hospital stay. If you have any questions about your discharge medications or the care you received while you were in the hospital after you are discharged, you can call the unit and asked to speak with the hospitalist on call if the hospitalist   that took care of you is not available. Once you are discharged, your primary care physician will handle any further medical issues. Please note that NO REFILLS for any discharge medications will be authorized once you are discharged, as it is imperative that you return to your primary care  physician (or establish a relationship with a primary care physician if you do not have one) for your aftercare needs so that they can reassess your need for medications and monitor your lab values.  ? Increase activity slowly   Complete by: As directed ?  ? ?  ? ?Allergies as of 06/05/2021   ?No Known Allergies ?  ? ?  ?Medication List  ?  ? ?TAKE these medications   ? ?aspirin EC 81 MG tablet ?Take 81 mg by mouth daily. Swallow whole. ?  ?Basaglar KwikPen 100 UNIT/ML ?Inject 28 Units into the skin daily. ?  ?blood glucose meter kit and supplies ?Dispense based on patient and insurance preference. Use up to four times daily as directed. (FOR ICD-10 E10.9, E11.9). ?  ?clotrimazole-betamethasone cream ?Commonly known as: LOTRISONE ?Apply topically 2 (two) times daily. Apply to foreskin ?What changed:  ?how much to take ?additional instructions ?  ?FreeStyle Libre 2 Sensor Misc ?3 Units by Does not apply route 3 (three) times daily with meals. ?  ?gabapentin 300 MG capsule ?Commonly known as: Neurontin ?Take 1 capsule (300 mg total) by mouth at bedtime. ?What changed: when to take this ?  ?HumaLOG KwikPen 100 UNIT/ML KwikPen ?Generic drug: insulin lispro ?Inject 3 Units into the skin 3 (three) times daily before meals. ?  ?metFORMIN 1000 MG tablet ?Commonly known as: GLUCOPHAGE ?TAKE 1 TABLET (1,000 MG TOTAL) BY MOUTH 2 (TWO) TIMES DAILY WITH A MEAL. ?  ?oxybutynin 5 MG tablet ?Commonly known as: DITROPAN ?Take 1 tablet (5 mg total) by mouth every 8 (eight) hours as needed for bladder spasms. ?  ?pantoprazole 40 MG tablet ?Commonly known as: PROTONIX ?Take 1 tablet (40 mg total) by mouth daily. Any generic PPI is okay to dispense ?What changed: additional instructions ?  ?rosuvastatin 40 MG tablet ?Commonly known as: CRESTOR ?Take 1 tablet (40 mg total) by mouth daily. ?  ?silodosin 8 MG Caps capsule ?Commonly known as: RAPAFLO ?Take 1 capsule (8 mg total) by mouth daily. ?  ? ?  ? ? Follow-up Information   ? ?  [name], Jessica C, MD. Schedule an appointment as soon as possible for a visit in 3 day(s).   ?Specialty: Family Medicine ?Contact information: ?[address] ?[address] ?[place] [address] ?[phone] ? ? ?  ?  ? ?  ?  ? ?  ? ?No Known Allergies ? ?Consultations: ?***Specify Physician/Group ? ? ?Procedures/Studies: ?CT Head Wo Contrast ? ?Result Date: 06/04/2021 ?CLINICAL DATA:  Altered mental status EXAM: CT HEAD WITHOUT CONTRAST TECHNIQUE: Contiguous axial images were obtained from the base of the skull through the vertex without intravenous contrast. RADIATION DOSE REDUCTION: This exam was performed according to the departmental dose-optimization program which includes automated exposure control, adjustment of the mA and/or kV according to patient size and/or use of iterative reconstruction technique. COMPARISON:  04/22/2021 FINDINGS: Brain: No acute intracranial findings are seen. There is dilation of third and both lateral ventricles. There is 5 mm smooth marginated high density structure in the area of foramen of Monro with no interval change. There are possible small old lacunar infarcts in the right basal ganglia. Cortical sulci are prominent. There is decreased   density in the periventricular white matter. Vascular: Unremarkable. Skull: No fracture is seen. Posterior bony spurs are noted at C2-C3 level, more so on the right side. Sinuses/Orbits: There is mucosal thickening in the ethmoid and both maxillary sinuses. Other: None IMPRESSION: No acute intracranial findings are seen in noncontrast CT brain. There is prominence of third and both lateral ventricles. Cortical sulci are prominent suggesting atrophy. There is 5 mm smooth marginated high density structure at the level of foramen of Monro, possibly colloid cysts. Chronic sinusitis. Cervical spondylosis with posterior bony spurs causing extrinsic pressure over the ventral margin of thecal sac on the right side at C2-C3 level. Electronically  Signed   By: Elmer Picker M.D.   On: 06/04/2021 15:56  ? ?DG Chest Port 1 View ? ?Result Date: 06/04/2021 ?CLINICAL DATA:  Short of breath.  Unresponsive. EXAM: PORTABLE CHEST 1 VIEW COMPARISON:  04/22/2021 FIN

## 2021-06-06 ENCOUNTER — Encounter: Payer: Managed Care, Other (non HMO) | Admitting: Physical Therapy

## 2021-06-06 ENCOUNTER — Observation Stay (HOSPITAL_COMMUNITY): Payer: Commercial Managed Care - HMO

## 2021-06-06 ENCOUNTER — Encounter: Payer: Self-pay | Admitting: Family Medicine

## 2021-06-06 DIAGNOSIS — N401 Enlarged prostate with lower urinary tract symptoms: Secondary | ICD-10-CM | POA: Diagnosis not present

## 2021-06-06 DIAGNOSIS — G9340 Encephalopathy, unspecified: Secondary | ICD-10-CM | POA: Diagnosis not present

## 2021-06-06 DIAGNOSIS — E118 Type 2 diabetes mellitus with unspecified complications: Secondary | ICD-10-CM | POA: Diagnosis not present

## 2021-06-06 DIAGNOSIS — E162 Hypoglycemia, unspecified: Secondary | ICD-10-CM | POA: Diagnosis not present

## 2021-06-06 LAB — GLUCOSE, CAPILLARY: Glucose-Capillary: 280 mg/dL — ABNORMAL HIGH (ref 70–99)

## 2021-06-06 LAB — VITAMIN D 25 HYDROXY (VIT D DEFICIENCY, FRACTURES): Vit D, 25-Hydroxy: 21.29 ng/mL — ABNORMAL LOW (ref 30–100)

## 2021-06-06 IMAGING — MR MR HEAD W/O CM
8 of 10 series · 35 of 48 positions shown · non-contrast
Comparison: CT head [DATE], MR head [DATE]

CLINICAL DATA: Normal pressure hydrocephalus, rule out
transependymal flow

EXAM:
MRI HEAD WITHOUT CONTRAST
TECHNIQUE: Multiplanar, multiecho pulse sequences of the brain and surrounding
structures were obtained without intravenous contrast.

[Series 3: DWI · axial · 3.0mm · 1.09mm/px · z∈[-89,+54]mm · 9 of 98 slices shown (1 of 4)]
[im 1/98]
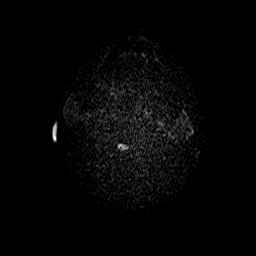
[im 13/98]
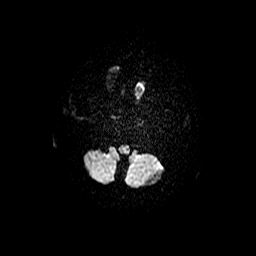
[im 25/98]
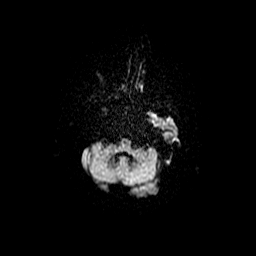
[im 37/98]
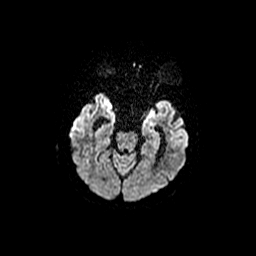
[im 49/98]
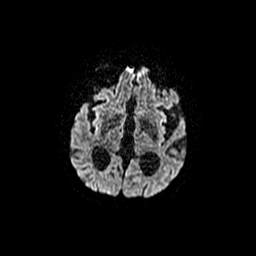
[im 61/98]
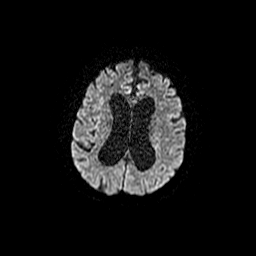
[im 73/98]
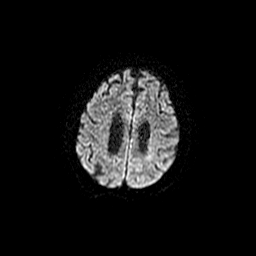
[im 85/98]
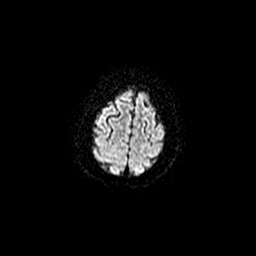
[im 98/98]
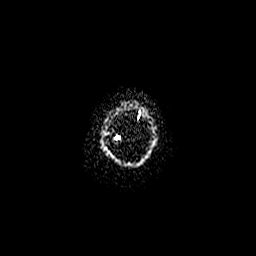

[Series 4: DWI · coronal · 5.0mm · 1.09mm/px · 7 of 64 slices shown (2 of 4)]
[im 1/64]
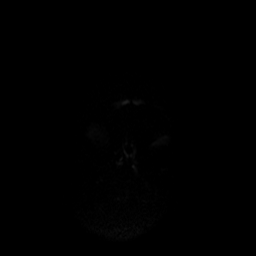
[im 11/64]
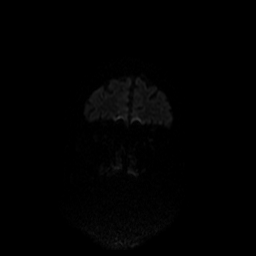
[im 22/64]
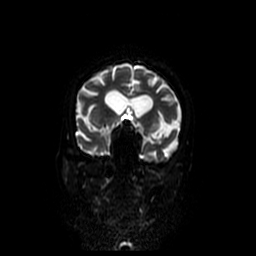
[im 32/64]
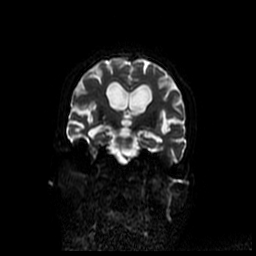
[im 43/64]
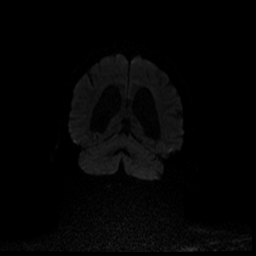
[im 53/64]
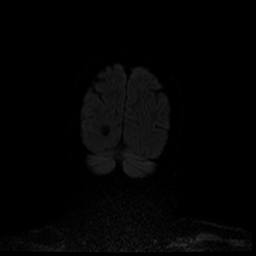
[im 64/64]
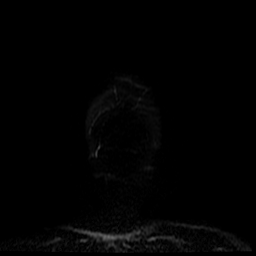

[Series 5: T1 · sagittal · 5.0mm · 0.47mm/px · 2 of 24 slices shown]
[im 1/24]
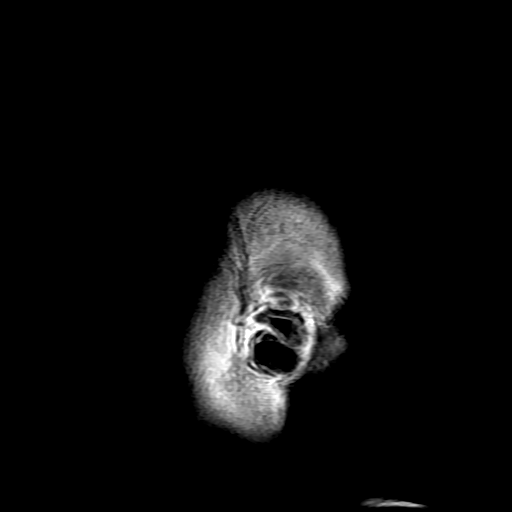
[im 24/24]
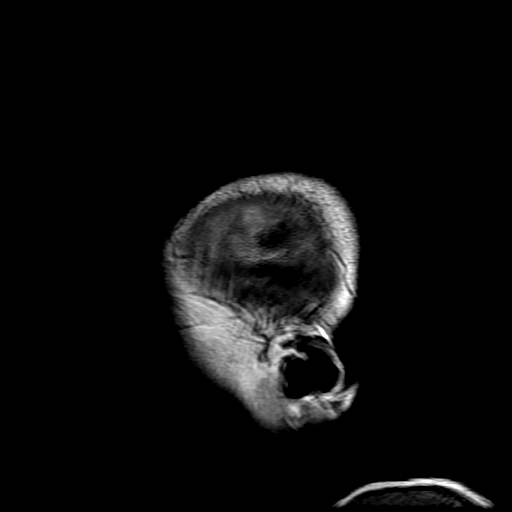

[Series 6: T2 · axial · 5.0mm · 0.43mm/px · z∈[-87,+56]mm · 3 of 25 slices shown]
[im 1/25]
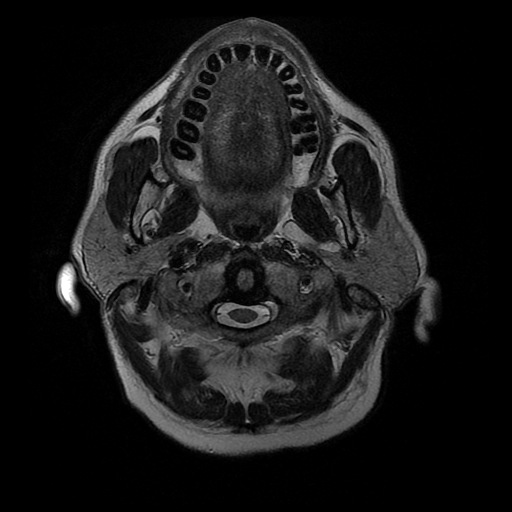
[im 13/25]
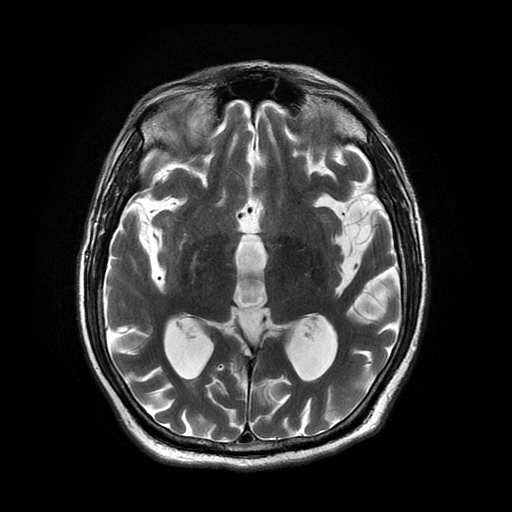
[im 25/25]
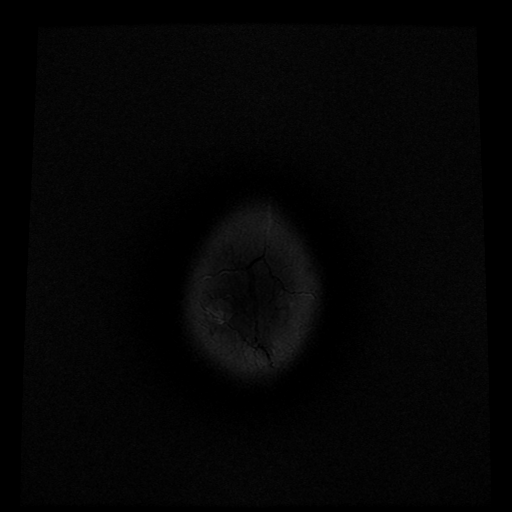

[Series 7: FLAIR · axial · 5.0mm · 0.43mm/px · z∈[-87,+56]mm · 3 of 25 slices shown]
[im 1/25]
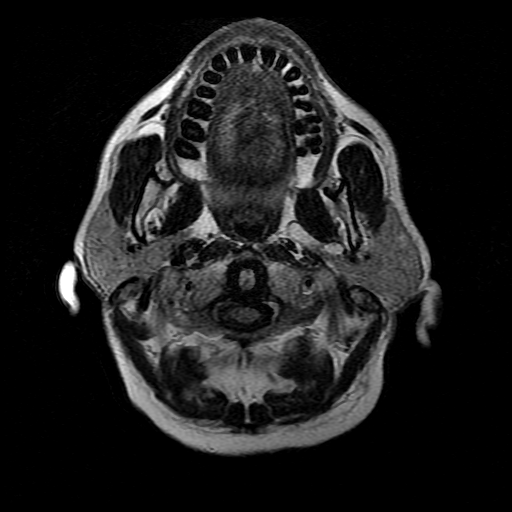
[im 13/25]
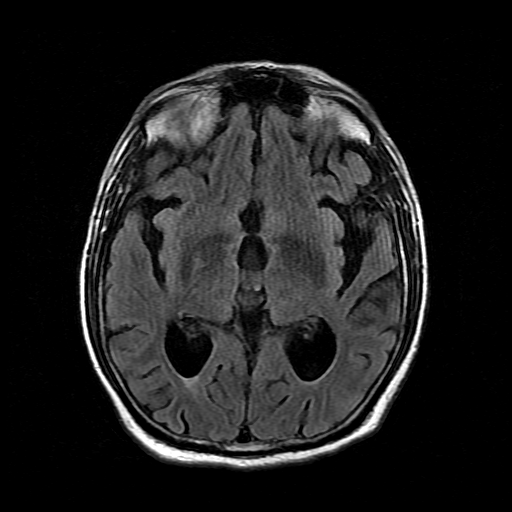
[im 25/25]
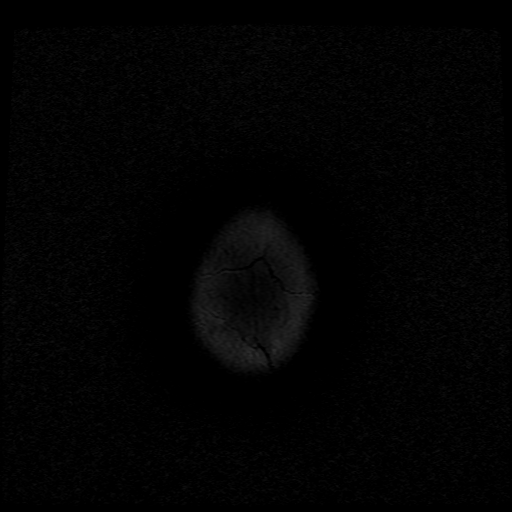

[Series 10: T2 post-contrast · coronal · 5.0mm · 0.39mm/px · 3 of 26 slices shown]
[im 1/26]
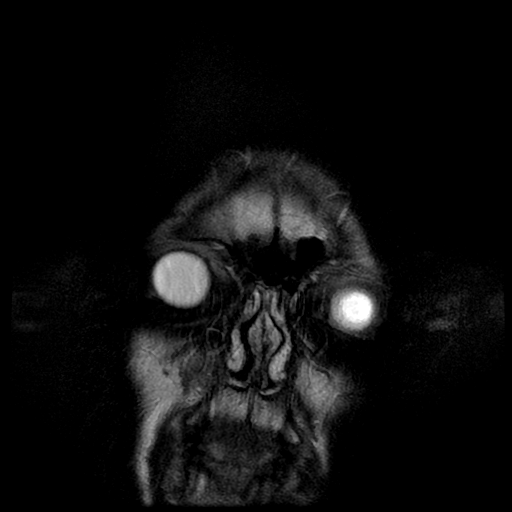
[im 13/26]
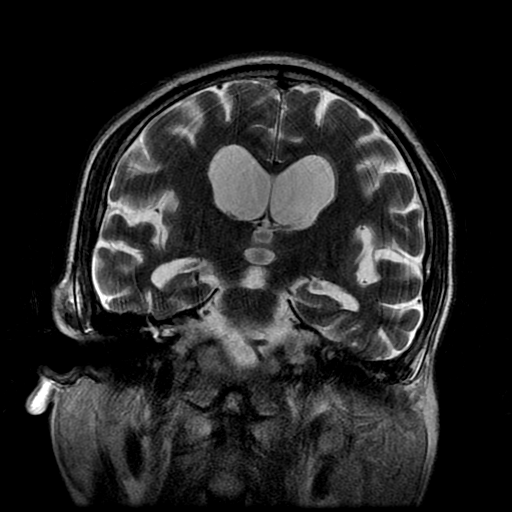
[im 26/26]
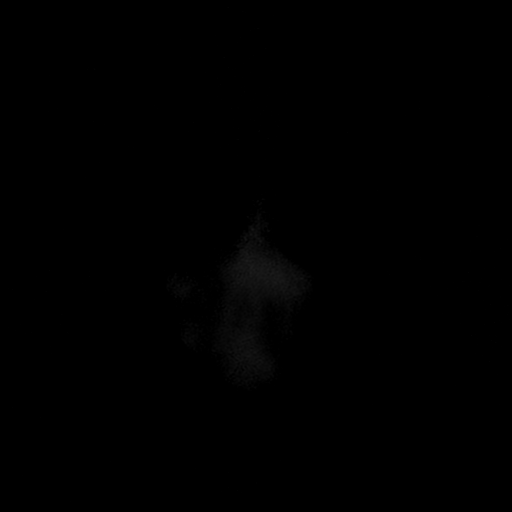

[Series 300: DWI · axial · 3.0mm · 1.09mm/px · z∈[-89,+54]mm · 5 of 49 slices shown (3 of 4)]
[im 1/49]
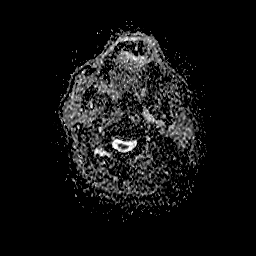
[im 13/49]
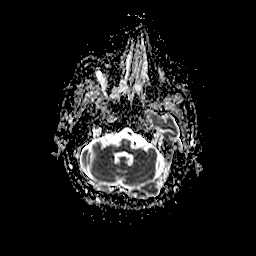
[im 25/49]
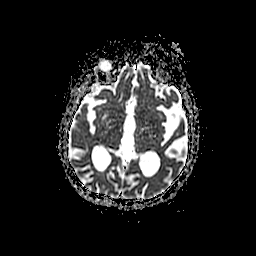
[im 37/49]
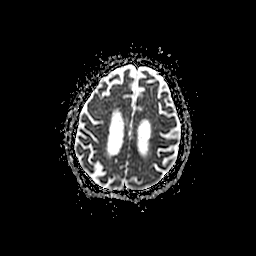
[im 49/49]
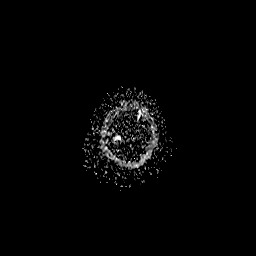

[Series 400: DWI · coronal · 5.0mm · 1.09mm/px · 3 of 32 slices shown (4 of 4)]
[im 1/32]
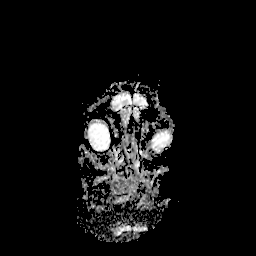
[im 16/32]
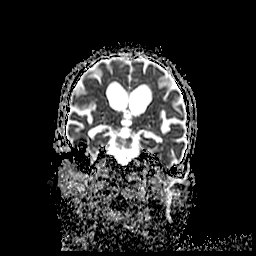
[im 32/32]
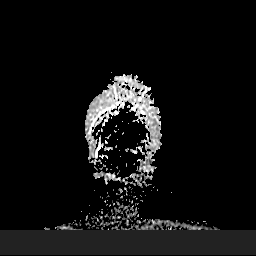

[35 of 48 positions shown; findings below may reference images not displayed]

FINDINGS: Brain: There is no evidence of acute intracranial hemorrhage,
extra-axial fluid collection, or acute infarct.

There is a background of mild global parenchymal volume loss. The
ventricular system is dilated, unchanged since the MRI from
[DATE], with the frontal horns measuring up to 5.0 cm in
transverse dimension. There is mild periventricular FLAIR signal
abnormality, unchanged since the prior MRI. There is no worsening
FLAIR signal abnormality to suggest transependymal flow of CSF. A
small remote infarct is again seen in the right basal ganglia.

A 5 mm colloid cyst is again seen in the level of the foramen of
STEIDEL, unchanged. There is no other solid mass lesion. There is no
midline shift.

Vascular: Normal flow voids.

Skull and upper cervical spine: Normal marrow signal.

Sinuses/Orbits: There is mild mucosal thickening in the paranasal
sinuses. Bilateral lens implants are in place. The globes and orbits
are otherwise unremarkable.

Other: None.
IMPRESSION: 1. Unchanged dilation of the ventricular system compared to the
study from [DATE]. Mild periventricular FLAIR signal abnormality
is also unchanged which may reflect sequela of chronic white matter
microangiopathy. There is no worsening FLAIR signal abnormality to
suggest transependymal flow of CSF.
2. Stable 5 mm colloid cyst at the foramen of STEIDEL.
3. No evidence of acute intracranial pathology.

## 2021-06-06 NOTE — Progress Notes (Signed)
Physical Therapy Treatment ?Patient Details ?Name: Charles [name] ?MRN: [id number] ?DOB: [birth year] ?Today's Date: 06/06/2021 ? ? ?History of Present Illness 61 y.o. male presenting via after being found unresponsive by a neighbor 06/04/21. Glucose in the 70s in the field and 20s in ED. Given D50 ampule.  BP in the [id number] to A999333 systolic and pulse in 123456 to 60s.  Admitted for observation. PMH: with medical history significant of hypertension, CAD status post CABG, BPH with history of obstruction, diabetes, hyperlipidemia ? ?  ?PT Comments  ? ? Patient continues with very unsteady gait requiring mod assist for balance, especially when freezing of gait occurs. He has difficulty steering RW and requires assist. He was able to walk 100 ft, however must have assist. He reports he no longer wants to go to SNF and plans to discharge home with neighbor to assist him until his sisters arrive to stay with him. Encouraged pt to re-consider SNF, however he continued to refuse. If he is discharged home, would recommend max HH services that can be arranged (aide, PT, OT).  ?   ?Recommendations for follow up therapy are one component of a multi-disciplinary discharge planning process, led by the attending physician.  Recommendations may be updated based on patient status, additional functional criteria and insurance authorization. ? ?Follow Up Recommendations ? Skilled nursing-short term rehab (<3 hours/day) ?  ?  ?Assistance Recommended at Discharge Frequent or constant Supervision/Assistance  ?Patient can return home with the following A lot of help with walking and/or transfers;A lot of help with bathing/dressing/bathroom;Assistance with cooking/housework;Assistance with feeding;Direct supervision/assist for medications management;Direct supervision/assist for financial management;Assist for transportation;Help with stairs or ramp for entrance ?  ?Equipment Recommendations ? BSC/3in1  ?  ?Recommendations for Other Services   ? ? ?   ?Precautions / Restrictions Precautions ?Precautions: Fall ?Precaution Comments: found down ?Restrictions ?Weight Bearing Restrictions: No  ?  ? ?Mobility ? Bed Mobility ?Overal bed mobility: Needs Assistance ?Bed Mobility: Supine to Sit ?  ?  ?Supine to sit: Mod assist ?  ?  ?General bed mobility comments: to raise torso and to scoot out to EOB ?  ? ?Transfers ?Overall transfer level: Needs assistance ?Equipment used: Rolling walker (2 wheels) ?Transfers: Sit to/from Stand ?Sit to Stand: Min guard ?  ?  ?  ?  ?  ?General transfer comment: no physical assist to power up from the chair, close min guard for unsteadiness and cues for safe hand placment ?  ? ?Ambulation/Gait ?Ambulation/Gait assistance: Min assist ?Gait Distance (Feet): 10 Feet (toileted; 100) ?Assistive device: Rolling walker (2 wheels) ?Gait Pattern/deviations: Step-to pattern, Step-through pattern, Decreased step length - right, Decreased step length - left, Knee flexed in stance - right, Knee flexed in stance - left, Shuffle, Trunk flexed, Narrow base of support, Drifts right/left ?Gait velocity: slowed ?  ?  ?General Gait Details: initially refusing RW and then agrees it is a good idea; short, shuffling steps; vc for which way to turn RW with regards to lines and small bathroom with pt insistent on trying his way; with longer walk (100 ft) pt with drifting to his rt and requires assist to steer RW ? ? ?Stairs ?  ?  ?  ?  ?  ? ? ?Wheelchair Mobility ?  ? ?Modified Rankin (Stroke Patients Only) ?  ? ? ?  ?Balance Overall balance assessment: Needs assistance ?Sitting-balance support: Feet supported, Bilateral upper extremity supported, Single extremity supported, Feet unsupported ?Sitting balance-Leahy Scale: Fair ?  ?  ?Standing  balance support: Bilateral upper extremity supported, During functional activity, Reliant on assistive device for balance ?Standing balance-Leahy Scale: Poor ?Standing balance comment: requires B UE support to maintain  balance ?  ?  ?  ?  ?  ?  ?  ?  ?  ?  ?  ?  ? ?  ?Cognition Arousal/Alertness: Awake/alert ?Behavior During Therapy: Flat affect, Anxious ?Overall Cognitive Status: Impaired/Different from baseline ?Area of Impairment: Following commands, Safety/judgement, Awareness, Problem solving ?  ?  ?  ?  ?  ?  ?  ?  ?  ?  ?  ?Following Commands: Follows one step commands with increased time, Follows multi-step commands with increased time, Follows multi-step commands inconsistently ?Safety/Judgement: Decreased awareness of safety, Decreased awareness of deficits ?Awareness: Emergent ?Problem Solving: Slow processing, Decreased initiation, Difficulty sequencing, Requires verbal cues, Requires tactile cues ?General Comments: poor insgiht to safety and deficits; poor awareness of lines and which way he needs to move in relation to lines ?  ?  ? ?  ?Exercises   ? ?  ?General Comments   ?  ?  ? ?Pertinent Vitals/Pain Pain Assessment ?Pain Assessment: No/denies pain  ? ? ?Home Living   ?  ?  ?  ?  ?  ?  ?  ?  ?  ?   ?  ?Prior Function    ?  ?  ?   ? ?PT Goals (current goals can now be found in the care plan section) Acute Rehab PT Goals ?Patient Stated Goal: go home today ?Time For Goal Achievement: 06/19/21 ?Potential to Achieve Goals: Fair ?Progress towards PT goals: Progressing toward goals ? ?  ?Frequency ? ? ? Min 2X/week ? ? ? ?  ?PT Plan Current plan remains appropriate  ? ? ?Co-evaluation   ?  ?  ?  ?  ? ?  ?AM-PAC PT "6 Clicks" Mobility   ?Outcome Measure ? Help needed turning from your back to your side while in a flat bed without using bedrails?: None ?Help needed moving from lying on your back to sitting on the side of a flat bed without using bedrails?: A Little ?Help needed moving to and from a bed to a chair (including a wheelchair)?: A Lot ?Help needed standing up from a chair using your arms (e.g., wheelchair or bedside chair)?: A Lot ?Help needed to walk in hospital room?: A Lot ?Help needed climbing 3-5 steps  with a railing? : Total ?6 Click Score: 14 ? ?  ?End of Session Equipment Utilized During Treatment: Gait belt ?Activity Tolerance: Patient limited by fatigue ?Patient left: in chair;with call bell/phone within reach;with chair alarm set ?Nurse Communication: Mobility status;Other (comment) (increased safety awareness deficits) ?PT Visit Diagnosis: Unsteadiness on feet (R26.81);Other abnormalities of gait and mobility (R26.89);Muscle weakness (generalized) (M62.81);History of falling (Z91.81);Difficulty in walking, not elsewhere classified (R26.2);Other symptoms and signs involving the nervous system (R29.898);Adult, failure to thrive (R62.7) ?  ? ? ?Time: [id number] ?PT Time Calculation (min) (ACUTE ONLY): 32 min ? ?Charges:  $Gait Training: 23-37 mins          ?          ? ? ?Arby Barrette, PT ?Acute Rehabilitation Services  ?Pager [phone] ?Office [phone] ? ? ? ?Jeanie Cooks Vernel Langenderfer ?06/06/2021, 12:24 PM ? ?

## 2021-06-06 NOTE — Plan of Care (Signed)
Brief neurology f/u note ? ?MRI brain with and without contrast showed known stable 80mm colloid cyst at the foramen of [place] unchanged mild hydrocephalus from MRI brain 04/22/21. 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 There is no worsening of FLAIR signal abnormality to suggest transependymal flow of CSF. Given the cyst's stability and small size w/o e/o transependymal flow, it can be followed by his established outpatient neurologist. No further inpatient neuro workup indicated at this time. Neurology to sign off, but please re-engage if additional neurologic concerns arise. ? ?Bing Neighbors, MD ?[facility] ?[phone] ? ?If 7pm- 7am, please page neurology on call as listed in AMION. ? ?

## 2021-06-06 NOTE — TOC Transition Note (Signed)
Transition of Care (TOC) - CM/SW Discharge Note ? ? ?Patient Details  ?Name: Charles [name] ?MRN: [id number] ?Date of Birth: [birth year] ? ?Transition of Care (TOC) CM/SW Contact:  ?Harriet Masson, RN ?Phone Number: ?06/06/2021, 11:04 AM ? ? ?Clinical Narrative:    ?Patient stable for discharge. Orders for Home Health. Spoke to patient regarding transition needs ?Patient is a recent  patient of [facility]. Spoke to [place] with [facility] and referral accepted for [facility] PT/OT/RN/SW ? ? ? ?Final next level of care: Home w Home Health Services ?Barriers to Discharge: Barriers Resolved ? ? ?Patient Goals and CMS Choice ?Patient states their goals for this hospitalization and ongoing recovery are:: go home ?CMS Medicare.gov Compare Post Acute Care list provided to:: Patient ?Choice offered to / list presented to : Patient ? ?Discharge Placement ?  ?           ?  ? home ?  ?  ? ?Discharge Plan and Services ?  ?  ?          Home with hh ?  ?  ?  ?  ?  ?HH Arranged: PT, OT, RN, Social Work ?HH Agency: [facility] ?Date HH Agency Contacted: 06/06/21 ?Time HH Agency Contacted: 1103 ?Representative spoke with at [facility] Agency: Barbara Cower ? ?Social Determinants of Health (SDOH) Interventions ?  ? ? ?Readmission Risk Interventions ?   ? View : No data to display.  ?  ?  ?  ? ? ? ? ? ?

## 2021-06-06 NOTE — Discharge Summary (Signed)
Physician Discharge Summary  ?Charles [name] [id number] DOB: 07-26-1960 DOA: 06/04/2021 ? ?PCP: Charles Mclean, MD ? ?Admit date: 06/04/2021 ?Discharge date: 06/06/2021 ? ?Admitted From: Home ?Disposition:  Home  ? ?Recommendations for Outpatient Follow-up:  ?Follow up with PCP in 1 weeks ?Please obtain BMP/CBC in one week ?Please follow up on the following pending results: Vitamin D 25 ? ?Home Health:YES ? ? ?Discharge Condition:Stable ?CODE STATUS:FULL ?Diet recommendation: Heart Healthy / Carb Modified  ? ? ?Brief/Interim Summary: ? ?Charles [name] is a 61 y.o. male with medical history significant of hypertension, CAD status post CABG, BPH with history of obstruction, diabetes, hyperlipidemia presenting after being found unresponsive. ?-His friend sent police to check on him as he did not answer his phone call, he was found by EMS to be unresponsive on the floor, he was given 1 dose of Versed as he was found to be stiff, but no seizures were noted, his work-up was significant for hypoglycemia where he was as low as 23 while in ED, apparently patient did take the wrong dose of his long-acting insulin, so he was admitted for further work-up. ? ? ?   ?Acute encephalopathy ?Hypoglycemia ?- Patient found unresponsive by neighbors last seen normal 2 days ago.  Noted to have CBG in the 70s on the scene but was in the 20s in the ED.  Received amp of D50 with some improvement in mentation but not all the way back to normal. ?-No further hypoglycemia during hospital stay. ?-Mentation back to baseline ?-Recently obtained B1 and B12 within normal limit ?-Requested neurology input in CT finding of dilated ventricles and sulci, with unsteady gait, and intermittent confusion, evaluate for possible NPH, MRI brain was repeated request, it showed known stable 5 mm colloid cyst at the foramen of Monro, unchanged mild hydrocephalus, with mild periventricular FLAIR signal which is stable, patient's findings were not concerning for  NPH, recommendation is to follow with primary neurologist as an outpatient. ?  ?Hypokalemia ?-Low potassium 2.8 while in ED, this was repleted. ? ?Diabetes mellitus type II, poorly controlled, insulin-dependent ?-  Presented with altered mental status and hypoglycemia as above.  Recently admitted for DKA and encephalopathy. ?-Patient was discharged on glargine 28 units, he used to be on 55 units in the past, apparently he took 55 units yesterday.. ?- Reportedly glucose was 70s in the field, 20s on arrival to the ED, now trending from 200s to 100s. ?-Patient has been started on Semglee 28 units during hospital stay and insulin sliding scale.  With good CBG controlled, he was instructed to continue his insulin at 28 units, he acknowledged understanding of the appropriate dose. ?  ?CAD ?Hyperlipidemia ?- Status post CABG ?- Continue home aspirin and rosuvastatin ? ?BPH history of obstruction ?-History of obstruction with prior renal failure. ?> Foley catheter re--placed March 9th ?- Continue home oxybutynin and replace home silodosin with formulary Flomax ?-Patient is following with Dr. Alyson [name], from urology, and being planned for urodynamic study. ?  ?Weakness ? Nursing facility recommended after recent admission.  Patient declined but did agree to home health.  Which has been arranged. ? ?  ?Hypomagnesemia ?-Repleted ? ?Discharge Diagnoses:  ?Principal Problem: ?  Acute encephalopathy ?Active Problems: ?  Hypertension ?  Coronary artery disease involving native coronary artery of native heart without angina pectoris ?  BPH with obstruction/lower urinary tract symptoms ?  Hx of CABG ?  Type 2 diabetes mellitus with complication, with long-term current use of insulin ([place]) ?  HLD (hyperlipidemia) ? ? ? ?Discharge Instructions ? ?Discharge Instructions   ? ? Ambulatory referral to Neurology   Complete by: As directed ?  ? An appointment is requested in approximately: 2 weeks  ? Diet - low sodium heart healthy    Complete by: As directed ?  ? Discharge instructions   Complete by: As directed ?  ? Follow with Primary MD [name], [name], MD in 7 days  ? ?Get CBC, CMP,  checked  by Primary MD next visit.  ? ? ?Activity: As tolerated with Full fall precautions use walker/cane & assistance as needed ? ?Disposition Home  ? ? ?Diet: Heart Healthy /carb modified ? ? ?On your next visit with your primary care physician please Get Medicines reviewed and adjusted. ? ? ?Please request your Prim.MD to go over all Hospital Tests and Procedure/Radiological results at the follow up, please get all Hospital records sent to your Prim MD by signing hospital release before you go home. ? ? ?If you experience worsening of your admission symptoms, develop shortness of breath, life threatening emergency, suicidal or homicidal thoughts you must seek medical attention immediately by calling 911 or calling your MD immediately  if symptoms less severe. ? ?You Must read complete instructions/literature along with all the possible adverse reactions/side effects for all the Medicines you take and that have been prescribed to you. Take any new Medicines after you have completely understood and accpet all the possible adverse reactions/side effects.  ? ?Do not drive, operating heavy machinery, perform activities at heights, swimming or participation in water activities or provide baby sitting services if your were admitted for syncope or siezures until you have seen by Primary MD or a Neurologist and advised to do so again. ? ?Do not drive when taking Pain medications.  ? ? ?Do not take more than prescribed Pain, Sleep and Anxiety Medications ? ?Special Instructions: If you have smoked or chewed Tobacco  in the last 2 yrs please stop smoking, stop any regular Alcohol  and or any Recreational drug use. ? ?Wear Seat belts while driving. ? ? ?Please note ? ?You were cared for by a hospitalist during your hospital stay. If you have any questions about your  discharge medications or the care you received while you were in the hospital after you are discharged, you can call the unit and asked to speak with the hospitalist on call if the hospitalist that took care of you is not available. Once you are discharged, your primary care physician will handle any further medical issues. Please note that NO REFILLS for any discharge medications will be authorized once you are discharged, as it is imperative that you return to your primary care physician (or establish a relationship with a primary care physician if you do not have one) for your aftercare needs so that they can reassess your need for medications and monitor your lab values.  ? Increase activity slowly   Complete by: As directed ?  ? ?  ? ?Allergies as of 06/06/2021   ?No Known Allergies ?  ? ?  ?Medication List  ?  ? ?TAKE these medications   ? ?aspirin EC 81 MG tablet ?Take 81 mg by mouth daily. Swallow whole. ?  ?Basaglar KwikPen 100 UNIT/ML ?Inject 28 Units into the skin daily. ?  ?blood glucose meter kit and supplies ?Dispense based on patient and insurance preference. Use up to four times daily as directed. (FOR ICD-10 E10.9, E11.9). ?  ?clotrimazole-betamethasone cream ?Commonly  known as: LOTRISONE ?Apply topically 2 (two) times daily. Apply to foreskin ?What changed:  ?how much to take ?additional instructions ?  ?FreeStyle Libre 2 Sensor Misc ?3 Units by Does not apply route 3 (three) times daily with meals. ?  ?gabapentin 300 MG capsule ?Commonly known as: Neurontin ?Take 1 capsule (300 mg total) by mouth at bedtime. ?What changed: when to take this ?  ?HumaLOG KwikPen 100 UNIT/ML KwikPen ?Generic drug: insulin lispro ?Inject 3 Units into the skin 3 (three) times daily before meals. ?  ?metFORMIN 1000 MG tablet ?Commonly known as: GLUCOPHAGE ?TAKE 1 TABLET (1,000 MG TOTAL) BY MOUTH 2 (TWO) TIMES DAILY WITH A MEAL. ?  ?oxybutynin 5 MG tablet ?Commonly known as: DITROPAN ?Take 1 tablet (5 mg total) by mouth  every 8 (eight) hours as needed for bladder spasms. ?  ?pantoprazole 40 MG tablet ?Commonly known as: PROTONIX ?Take 1 tablet (40 mg total) by mouth daily. Any generic PPI is okay to dispense ?What changed:

## 2021-06-06 NOTE — Progress Notes (Signed)
Patient requesting ride home as he has no one to pick him up and is unable to take the bus. CSW provided taxi voucher on hard chart.  ? ?Osborne Casco Caniyah Murley ?LCSW, MSW, MHA ? ?

## 2021-06-07 ENCOUNTER — Telehealth: Payer: Self-pay

## 2021-06-07 NOTE — Telephone Encounter (Addendum)
Transition Care Management Unsuccessful Follow-up Telephone Call ?TCM DC [facility] 06-06-21 Dx: acute encephalopathy ?Date of discharge and from where:   ? ?Attempts:  1st Attempt ? ?Reason for unsuccessful TCM follow-up call:  Left voice message ? ? ?Transition Care Management Unsuccessful Follow-up Telephone Call ? ?Date of discharge and from where:  TCM DC [facility] 06-06-21 Dx: acute encephalopathy ? ?Attempts:  2nd Attempt ? ?Reason for unsuccessful TCM follow-up call:  Left voice message ? ?  ?  ?

## 2021-06-08 ENCOUNTER — Telehealth: Payer: Self-pay | Admitting: Family Medicine

## 2021-06-08 ENCOUNTER — Ambulatory Visit (INDEPENDENT_AMBULATORY_CARE_PROVIDER_SITE_OTHER): Payer: Managed Care, Other (non HMO) | Admitting: Family Medicine

## 2021-06-08 DIAGNOSIS — Z91199 Patient's noncompliance with other medical treatment and regimen due to unspecified reason: Secondary | ICD-10-CM

## 2021-06-08 NOTE — Telephone Encounter (Signed)
HH nurse aware

## 2021-06-08 NOTE — Telephone Encounter (Signed)
HH is requesting nursing orders once a week for 4 weeks. She stated she does have a secure vm.  ?

## 2021-06-08 NOTE — Progress Notes (Signed)
Patient was a no-show for visit today. ?I called and left a message on his phone, asked him to call us so we will know he is okay ?I also called his Marijean Bravo and spoke with her.  She talked to her brother last night and he was okay.  I asked her to please connect with him today and confirm his welfare.  She agrees and will do so ? ?Called again 3/30- no answer still ?Lmom ?Called sister - she was able to reach her brother yesterday and he is ok  ?

## 2021-06-08 NOTE — Telephone Encounter (Signed)
Christopher from [facility] home [facility] called for VO for pt.  ? ?PT 2x4 and 1x4. [phone] ? ?Please advise  ?

## 2021-06-09 ENCOUNTER — Encounter: Payer: Managed Care, Other (non HMO) | Admitting: Physical Therapy

## 2021-06-09 NOTE — Telephone Encounter (Signed)
Charles [name] is aware.  ?

## 2021-06-12 ENCOUNTER — Encounter (HOSPITAL_BASED_OUTPATIENT_CLINIC_OR_DEPARTMENT_OTHER): Payer: Self-pay | Admitting: Cardiology

## 2021-06-17 NOTE — Plan of Care (Signed)
Patient called to notify him of his low vitamin D level of 21.29. Recommended starting po supplementation of vitamin D with 600-800 IU daily. I have also messaged his PCP Dr. Patsy Lager via secure chat to inform her of his low vitamin D level.  ? ?Electronically signed: Dr. Caryl Pina ? ?

## 2021-06-27 ENCOUNTER — Ambulatory Visit: Payer: Managed Care, Other (non HMO) | Admitting: Physician Assistant

## 2021-06-27 ENCOUNTER — Institutional Professional Consult (permissible substitution): Payer: Managed Care, Other (non HMO) | Admitting: Diagnostic Neuroimaging

## 2021-06-28 ENCOUNTER — Encounter: Payer: Self-pay | Admitting: Diagnostic Neuroimaging

## 2021-06-29 ENCOUNTER — Telehealth: Payer: Self-pay | Admitting: Urology

## 2021-06-29 NOTE — Telephone Encounter (Signed)
"  Rejection Reason - Patient was No Show"  on 06/28/21 @ 12 p for  Urodynamics  ?Valinda Party said on Jun 28, 2021 5:34 PM ?

## 2021-06-29 NOTE — Telephone Encounter (Signed)
FYI

## 2021-06-30 ENCOUNTER — Other Ambulatory Visit: Payer: Self-pay

## 2021-06-30 ENCOUNTER — Inpatient Hospital Stay (HOSPITAL_COMMUNITY)
Admission: EM | Admit: 2021-06-30 | Discharge: 2021-07-07 | DRG: 698 | Disposition: A | Discharge status: Skilled Nursing Facility | Payer: Commercial Managed Care - HMO | Attending: Internal Medicine | Admitting: Internal Medicine

## 2021-06-30 ENCOUNTER — Inpatient Hospital Stay (HOSPITAL_COMMUNITY): Payer: Commercial Managed Care - HMO

## 2021-06-30 ENCOUNTER — Emergency Department (HOSPITAL_COMMUNITY): Payer: Commercial Managed Care - HMO

## 2021-06-30 ENCOUNTER — Encounter (HOSPITAL_COMMUNITY): Payer: Self-pay

## 2021-06-30 DIAGNOSIS — N401 Enlarged prostate with lower urinary tract symptoms: Secondary | ICD-10-CM | POA: Diagnosis present

## 2021-06-30 DIAGNOSIS — Z8673 Personal history of transient ischemic attack (TIA), and cerebral infarction without residual deficits: Secondary | ICD-10-CM

## 2021-06-30 DIAGNOSIS — Z833 Family history of diabetes mellitus: Secondary | ICD-10-CM

## 2021-06-30 DIAGNOSIS — N320 Bladder-neck obstruction: Secondary | ICD-10-CM | POA: Diagnosis present

## 2021-06-30 DIAGNOSIS — G9341 Metabolic encephalopathy: Secondary | ICD-10-CM | POA: Diagnosis not present

## 2021-06-30 DIAGNOSIS — E785 Hyperlipidemia, unspecified: Secondary | ICD-10-CM | POA: Diagnosis present

## 2021-06-30 DIAGNOSIS — E872 Acidosis, unspecified: Secondary | ICD-10-CM | POA: Diagnosis present

## 2021-06-30 DIAGNOSIS — R6520 Severe sepsis without septic shock: Secondary | ICD-10-CM | POA: Diagnosis present

## 2021-06-30 DIAGNOSIS — R680 Hypothermia, not associated with low environmental temperature: Secondary | ICD-10-CM | POA: Diagnosis present

## 2021-06-30 DIAGNOSIS — R339 Retention of urine, unspecified: Secondary | ICD-10-CM | POA: Diagnosis not present

## 2021-06-30 DIAGNOSIS — Z681 Body mass index (BMI) 19 or less, adult: Secondary | ICD-10-CM | POA: Diagnosis not present

## 2021-06-30 DIAGNOSIS — E118 Type 2 diabetes mellitus with unspecified complications: Secondary | ICD-10-CM | POA: Diagnosis not present

## 2021-06-30 DIAGNOSIS — I10 Essential (primary) hypertension: Secondary | ICD-10-CM | POA: Diagnosis present

## 2021-06-30 DIAGNOSIS — F0150 Vascular dementia without behavioral disturbance: Secondary | ICD-10-CM | POA: Diagnosis present

## 2021-06-30 DIAGNOSIS — L8990 Pressure ulcer of unspecified site, unspecified stage: Secondary | ICD-10-CM | POA: Insufficient documentation

## 2021-06-30 DIAGNOSIS — I2510 Atherosclerotic heart disease of native coronary artery without angina pectoris: Secondary | ICD-10-CM | POA: Diagnosis present

## 2021-06-30 DIAGNOSIS — T68XXXA Hypothermia, initial encounter: Secondary | ICD-10-CM | POA: Diagnosis present

## 2021-06-30 DIAGNOSIS — E1142 Type 2 diabetes mellitus with diabetic polyneuropathy: Secondary | ICD-10-CM | POA: Diagnosis present

## 2021-06-30 DIAGNOSIS — E43 Unspecified severe protein-calorie malnutrition: Secondary | ICD-10-CM | POA: Diagnosis present

## 2021-06-30 DIAGNOSIS — L89312 Pressure ulcer of right buttock, stage 2: Secondary | ICD-10-CM | POA: Diagnosis present

## 2021-06-30 DIAGNOSIS — N390 Urinary tract infection, site not specified: Secondary | ICD-10-CM

## 2021-06-30 DIAGNOSIS — E860 Dehydration: Secondary | ICD-10-CM | POA: Diagnosis present

## 2021-06-30 DIAGNOSIS — Z794 Long term (current) use of insulin: Secondary | ICD-10-CM | POA: Diagnosis not present

## 2021-06-30 DIAGNOSIS — Z79899 Other long term (current) drug therapy: Secondary | ICD-10-CM

## 2021-06-30 DIAGNOSIS — E11649 Type 2 diabetes mellitus with hypoglycemia without coma: Secondary | ICD-10-CM | POA: Diagnosis not present

## 2021-06-30 DIAGNOSIS — I447 Left bundle-branch block, unspecified: Secondary | ICD-10-CM | POA: Diagnosis present

## 2021-06-30 DIAGNOSIS — T83511A Infection and inflammatory reaction due to indwelling urethral catheter, initial encounter: Secondary | ICD-10-CM | POA: Diagnosis present

## 2021-06-30 DIAGNOSIS — G928 Other toxic encephalopathy: Secondary | ICD-10-CM | POA: Diagnosis present

## 2021-06-30 DIAGNOSIS — Y846 Urinary catheterization as the cause of abnormal reaction of the patient, or of later complication, without mention of misadventure at the time of the procedure: Secondary | ICD-10-CM | POA: Diagnosis present

## 2021-06-30 DIAGNOSIS — A419 Sepsis, unspecified organism: Secondary | ICD-10-CM

## 2021-06-30 DIAGNOSIS — Z951 Presence of aortocoronary bypass graft: Secondary | ICD-10-CM | POA: Diagnosis not present

## 2021-06-30 DIAGNOSIS — N319 Neuromuscular dysfunction of bladder, unspecified: Secondary | ICD-10-CM | POA: Diagnosis present

## 2021-06-30 DIAGNOSIS — N179 Acute kidney failure, unspecified: Secondary | ICD-10-CM | POA: Diagnosis present

## 2021-06-30 DIAGNOSIS — I959 Hypotension, unspecified: Secondary | ICD-10-CM | POA: Diagnosis not present

## 2021-06-30 DIAGNOSIS — Z20822 Contact with and (suspected) exposure to covid-19: Secondary | ICD-10-CM | POA: Diagnosis present

## 2021-06-30 DIAGNOSIS — Z8249 Family history of ischemic heart disease and other diseases of the circulatory system: Secondary | ICD-10-CM

## 2021-06-30 DIAGNOSIS — E119 Type 2 diabetes mellitus without complications: Secondary | ICD-10-CM

## 2021-06-30 DIAGNOSIS — G9340 Encephalopathy, unspecified: Secondary | ICD-10-CM | POA: Diagnosis present

## 2021-06-30 DIAGNOSIS — Z7982 Long term (current) use of aspirin: Secondary | ICD-10-CM

## 2021-06-30 DIAGNOSIS — E876 Hypokalemia: Secondary | ICD-10-CM | POA: Diagnosis present

## 2021-06-30 DIAGNOSIS — Z7984 Long term (current) use of oral hypoglycemic drugs: Secondary | ICD-10-CM

## 2021-06-30 DIAGNOSIS — E1165 Type 2 diabetes mellitus with hyperglycemia: Secondary | ICD-10-CM | POA: Diagnosis present

## 2021-06-30 LAB — APTT: aPTT: 40 seconds — ABNORMAL HIGH (ref 24–36)

## 2021-06-30 LAB — URINALYSIS, ROUTINE W REFLEX MICROSCOPIC
Bilirubin Urine: NEGATIVE
Glucose, UA: >=500 mg/dL — AB
Ketones, ur: 80 mg/dL — AB
Nitrite: POSITIVE — AB
Protein, ur: NEGATIVE mg/dL
RBC / HPF: >50 RBC/hpf — ABNORMAL HIGH (ref 0–5)
Specific Gravity, Urine: 1.01 (ref 1.005–1.030)
WBC, UA: >50 WBC/hpf — ABNORMAL HIGH (ref 0–5)
pH: 5 (ref 5.0–8.0)

## 2021-06-30 LAB — CBC WITH DIFFERENTIAL/PLATELET
Abs Immature Granulocytes: 0.05 10*3/uL (ref 0.00–0.07)
Basophils Absolute: 0 10*3/uL (ref 0.0–0.1)
Basophils Relative: 0 %
Eosinophils Absolute: 0 10*3/uL (ref 0.0–0.5)
Eosinophils Relative: 1 %
HCT: 34.7 % — ABNORMAL LOW (ref 39.0–52.0)
Hemoglobin: 12.6 g/dL — ABNORMAL LOW (ref 13.0–17.0)
Immature Granulocytes: 1 %
Lymphocytes Relative: 10 %
Lymphs Abs: 0.6 10*3/uL — ABNORMAL LOW (ref 0.7–4.0)
MCH: 30.4 pg (ref 26.0–34.0)
MCHC: 36.3 g/dL — ABNORMAL HIGH (ref 30.0–36.0)
MCV: 83.8 fL (ref 80.0–100.0)
Monocytes Absolute: 0.8 10*3/uL (ref 0.1–1.0)
Monocytes Relative: 12 %
Neutro Abs: 5.1 10*3/uL (ref 1.7–7.7)
Neutrophils Relative %: 76 %
Platelets: 179 10*3/uL (ref 150–400)
RBC: 4.14 MIL/uL — ABNORMAL LOW (ref 4.22–5.81)
RDW: 15.1 % (ref 11.5–15.5)
WBC: 6.6 10*3/uL (ref 4.0–10.5)
nRBC: 0 % (ref 0.0–0.2)

## 2021-06-30 LAB — LACTIC ACID, PLASMA: Lactic Acid, Venous: 1.6 mmol/L (ref 0.5–1.9)

## 2021-06-30 LAB — COMPREHENSIVE METABOLIC PANEL
ALT: 17 U/L (ref 0–44)
AST: 16 U/L (ref 15–41)
Albumin: 2.9 g/dL — ABNORMAL LOW (ref 3.5–5.0)
Alkaline Phosphatase: 121 U/L (ref 38–126)
Anion gap: 20 — ABNORMAL HIGH (ref 5–15)
BUN: 28 mg/dL — ABNORMAL HIGH (ref 6–20)
CO2: 13 mmol/L — ABNORMAL LOW (ref 22–32)
Calcium: 8.9 mg/dL (ref 8.9–10.3)
Chloride: 106 mmol/L (ref 98–111)
Creatinine, Ser: 1.22 mg/dL (ref 0.61–1.24)
GFR, Estimated: >60 mL/min (ref >60)
Glucose, Bld: 273 mg/dL — ABNORMAL HIGH (ref 70–99)
Potassium: 3.4 mmol/L — ABNORMAL LOW (ref 3.5–5.1)
Sodium: 139 mmol/L (ref 135–145)
Total Bilirubin: 1 mg/dL (ref 0.3–1.2)
Total Protein: 5.6 g/dL — ABNORMAL LOW (ref 6.5–8.1)

## 2021-06-30 LAB — RESP PANEL BY RT-PCR (FLU A&B, COVID) ARPGX2
Influenza A by PCR: NEGATIVE
Influenza B by PCR: NEGATIVE
SARS Coronavirus 2 by RT PCR: NEGATIVE

## 2021-06-30 LAB — PROTIME-INR
INR: 1.1 (ref 0.8–1.2)
Prothrombin Time: 14.2 seconds (ref 11.4–15.2)

## 2021-06-30 IMAGING — US US RENAL
1 series · 14 of 25 positions shown · non-contrast
Comparison: None.

CLINICAL DATA: Acute kidney injury.

EXAM:
RENAL / URINARY TRACT ULTRASOUND COMPLETE

[Series 1: us renal · 14 of 48 slices shown]
[im 1/48]
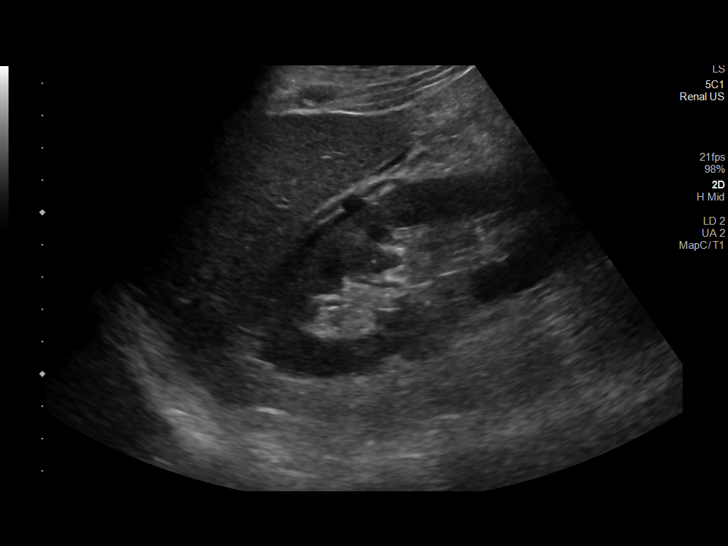
[im 4/48]
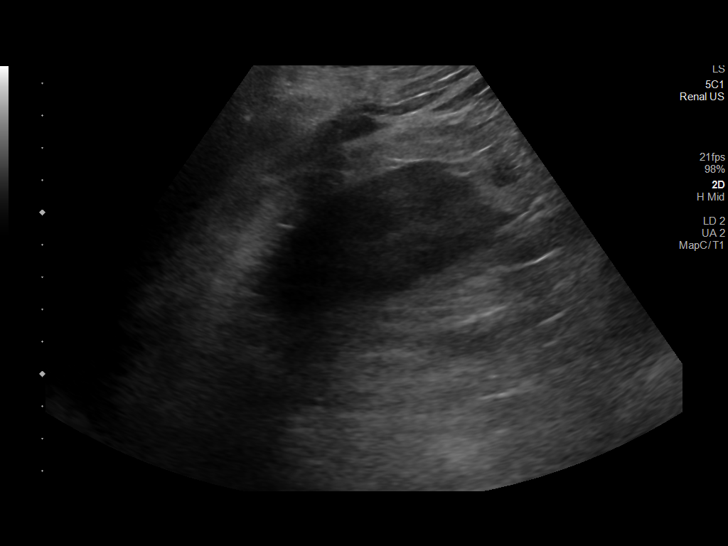
[im 8/48]
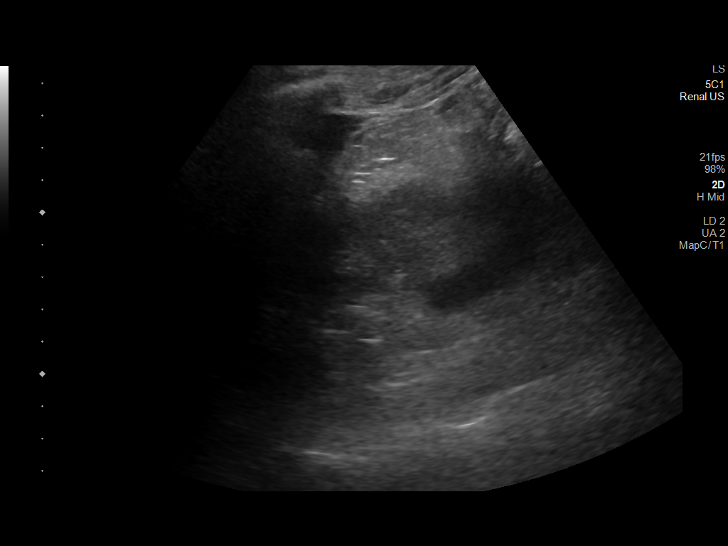
[im 12/48]
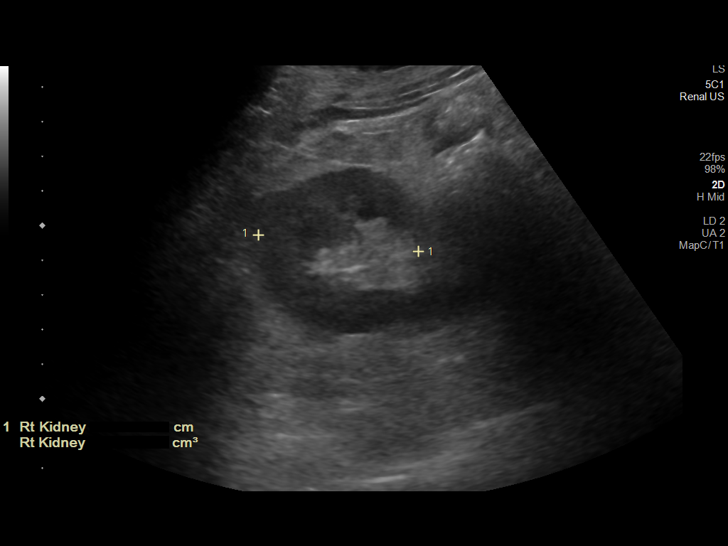
[im 16/48]
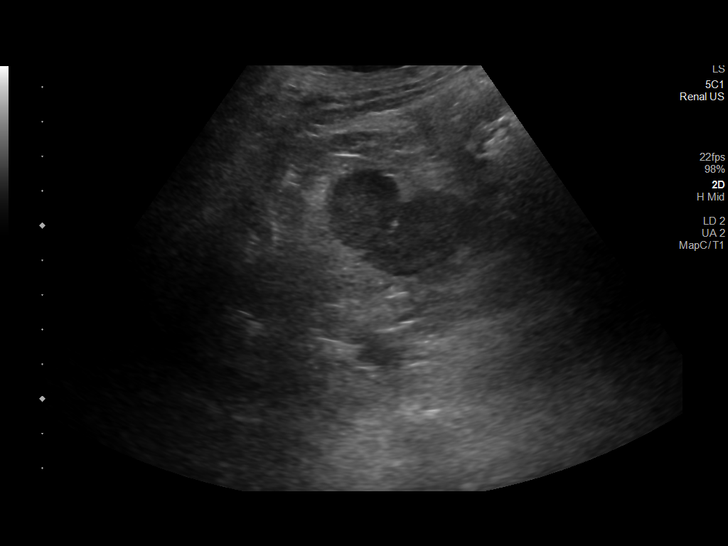
[im 18/48]
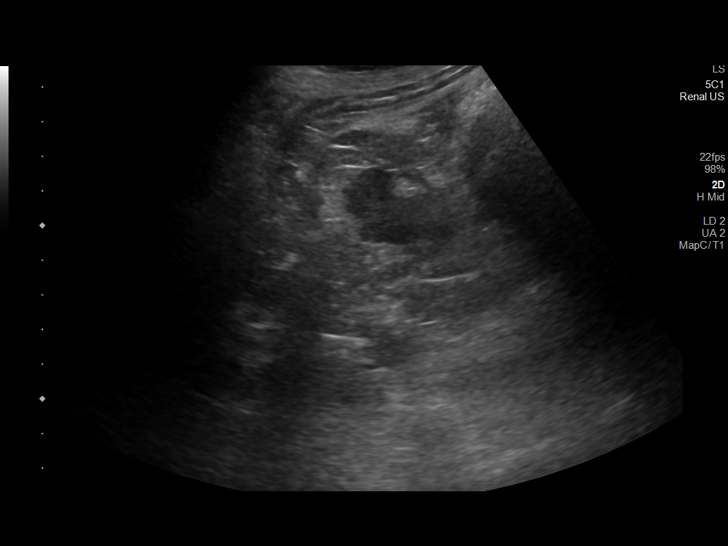
[im 22/48]
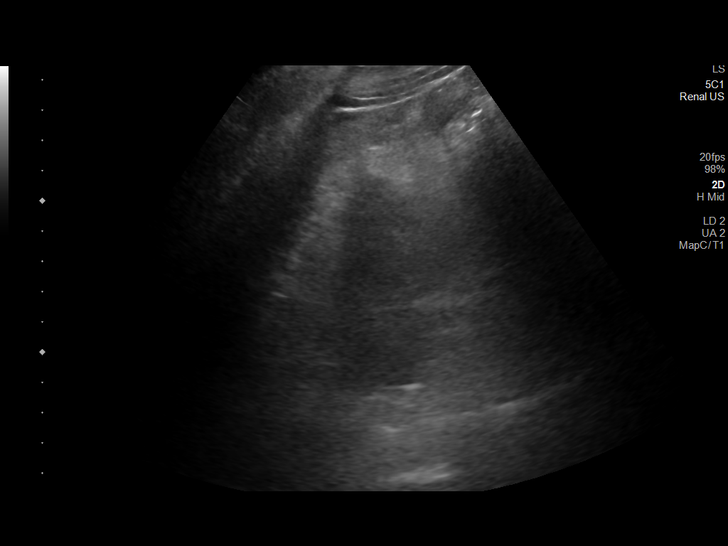
[im 26/48]
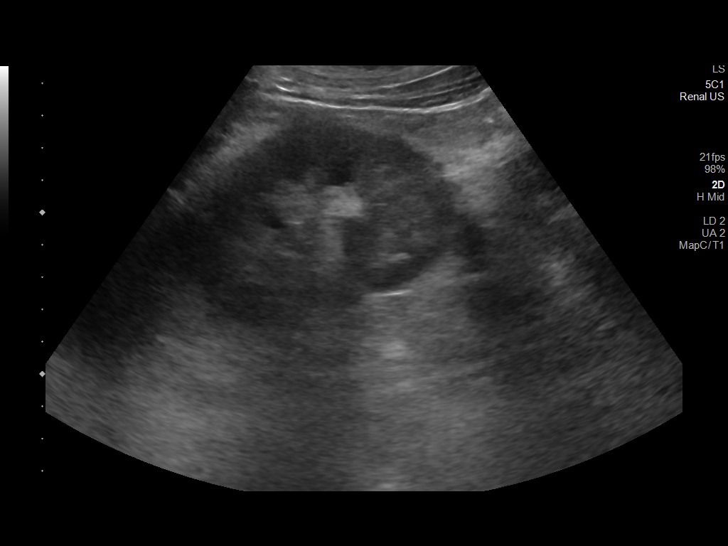
[im 30/48]
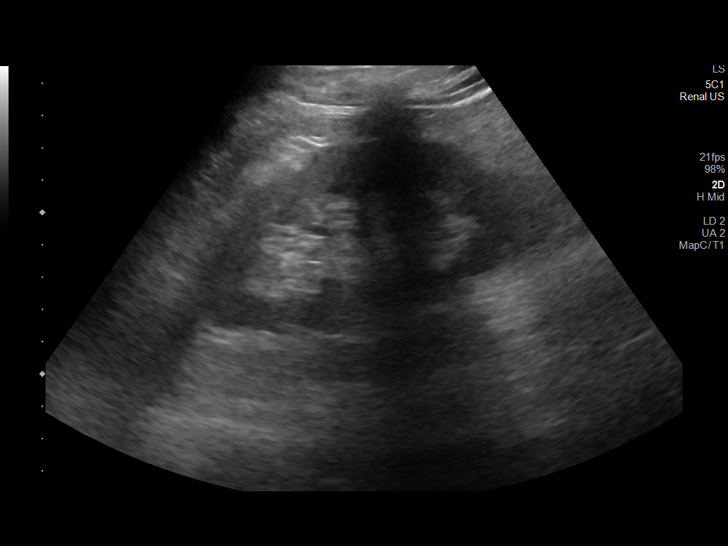
[im 32/48]
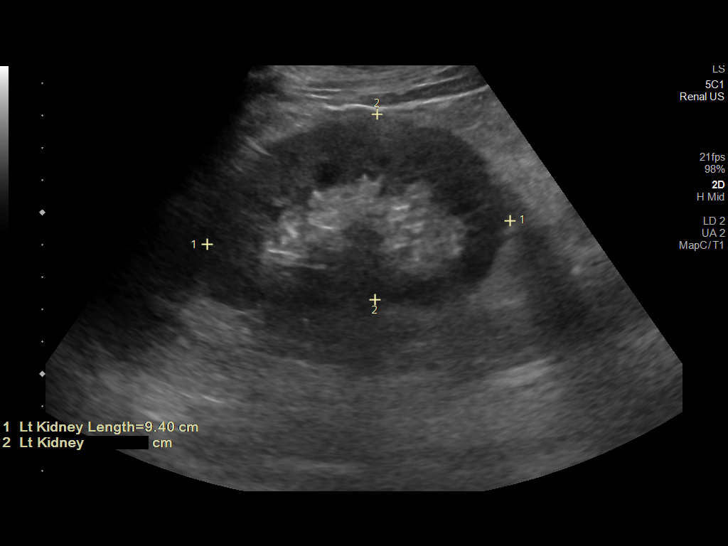
[im 36/48]
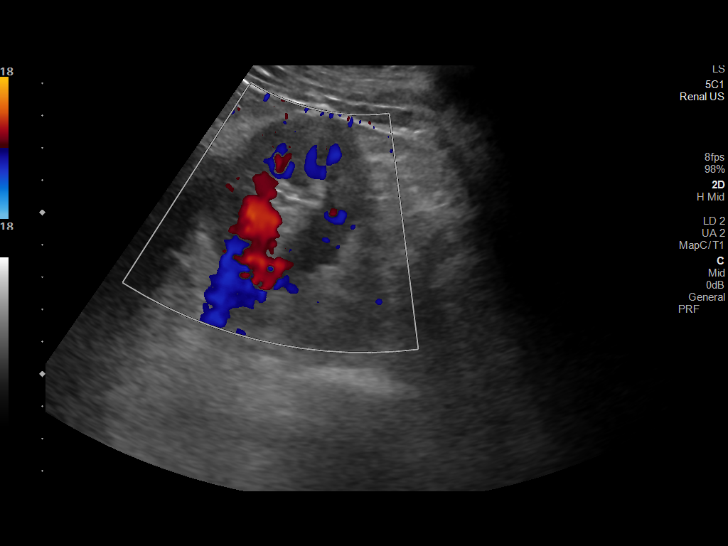
[im 40/48]
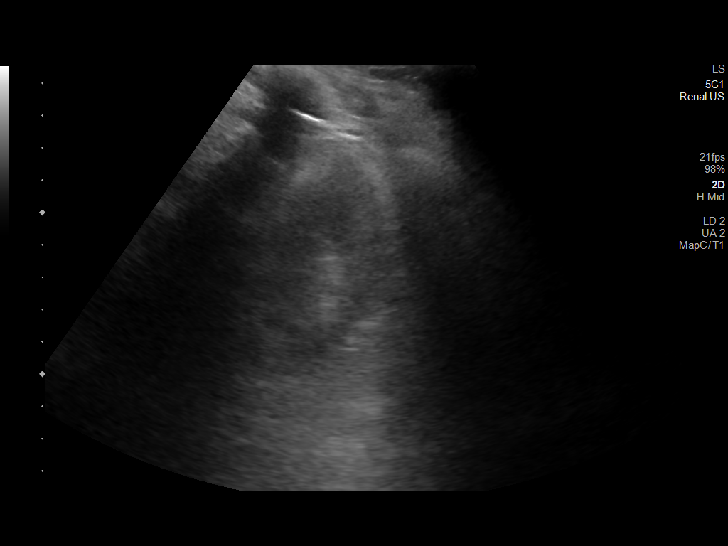
[im 44/48]
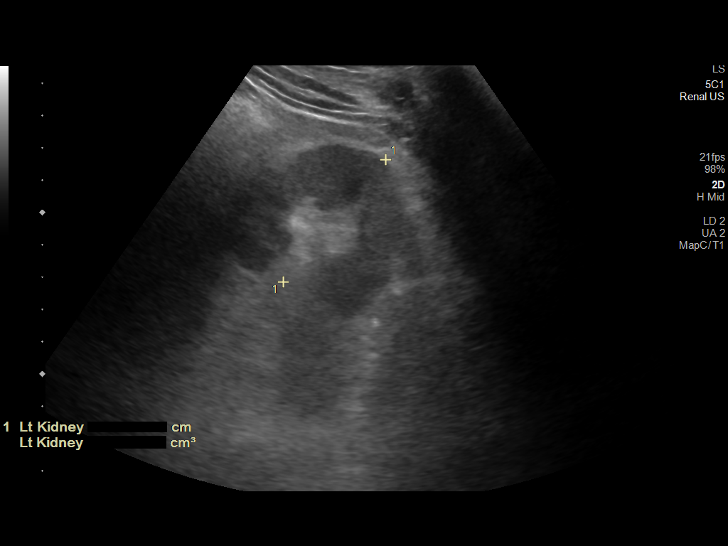
[im 48/48]
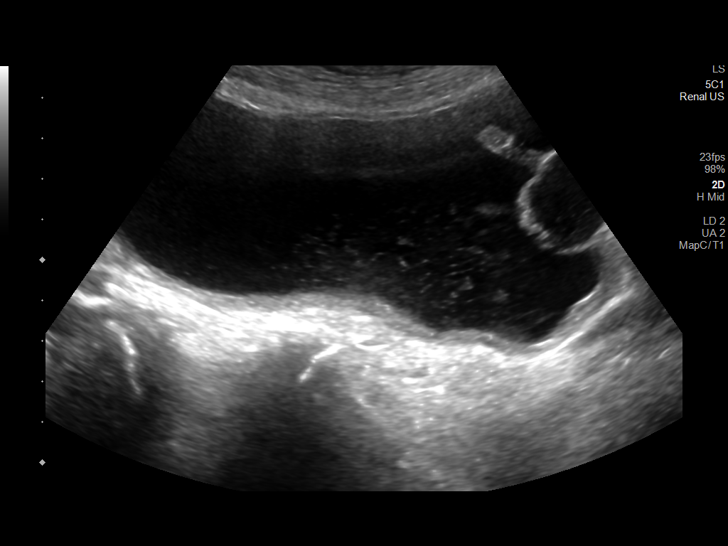

[14 of 25 positions shown; findings below may reference images not displayed]

FINDINGS: Right Kidney:

Renal measurements: 11.1 cm x 4.6 cm x 4.6 cm = volume: 124.48 mL.
Echogenicity within normal limits. No mass or hydronephrosis
visualized.

Left Kidney:

Renal measurements: 10.4 cm x 4.7 cm x 4.9 cm = volume: 126.38 mL.
Echogenicity within normal limits. No mass or hydronephrosis
visualized.

Bladder:

A Foley catheter is seen within a distended urinary bladder which
also contains echogenic debris.

Other:

A trace amount of perinephric fluid is seen on the right.
IMPRESSION: 1. Trace amount of right-sided perinephric fluid.
2. Echogenic debris within a distended urinary bladder. While this
may represent proteinaceous material, small amount of blood products
cannot be excluded.

## 2021-06-30 IMAGING — DX DG CHEST 1V PORT
1 series · 1 of 1 positions shown · non-contrast
Comparison: [DATE]

CLINICAL DATA: Questionable sepsis.

EXAM:
PORTABLE CHEST 1 VIEW

[chest]
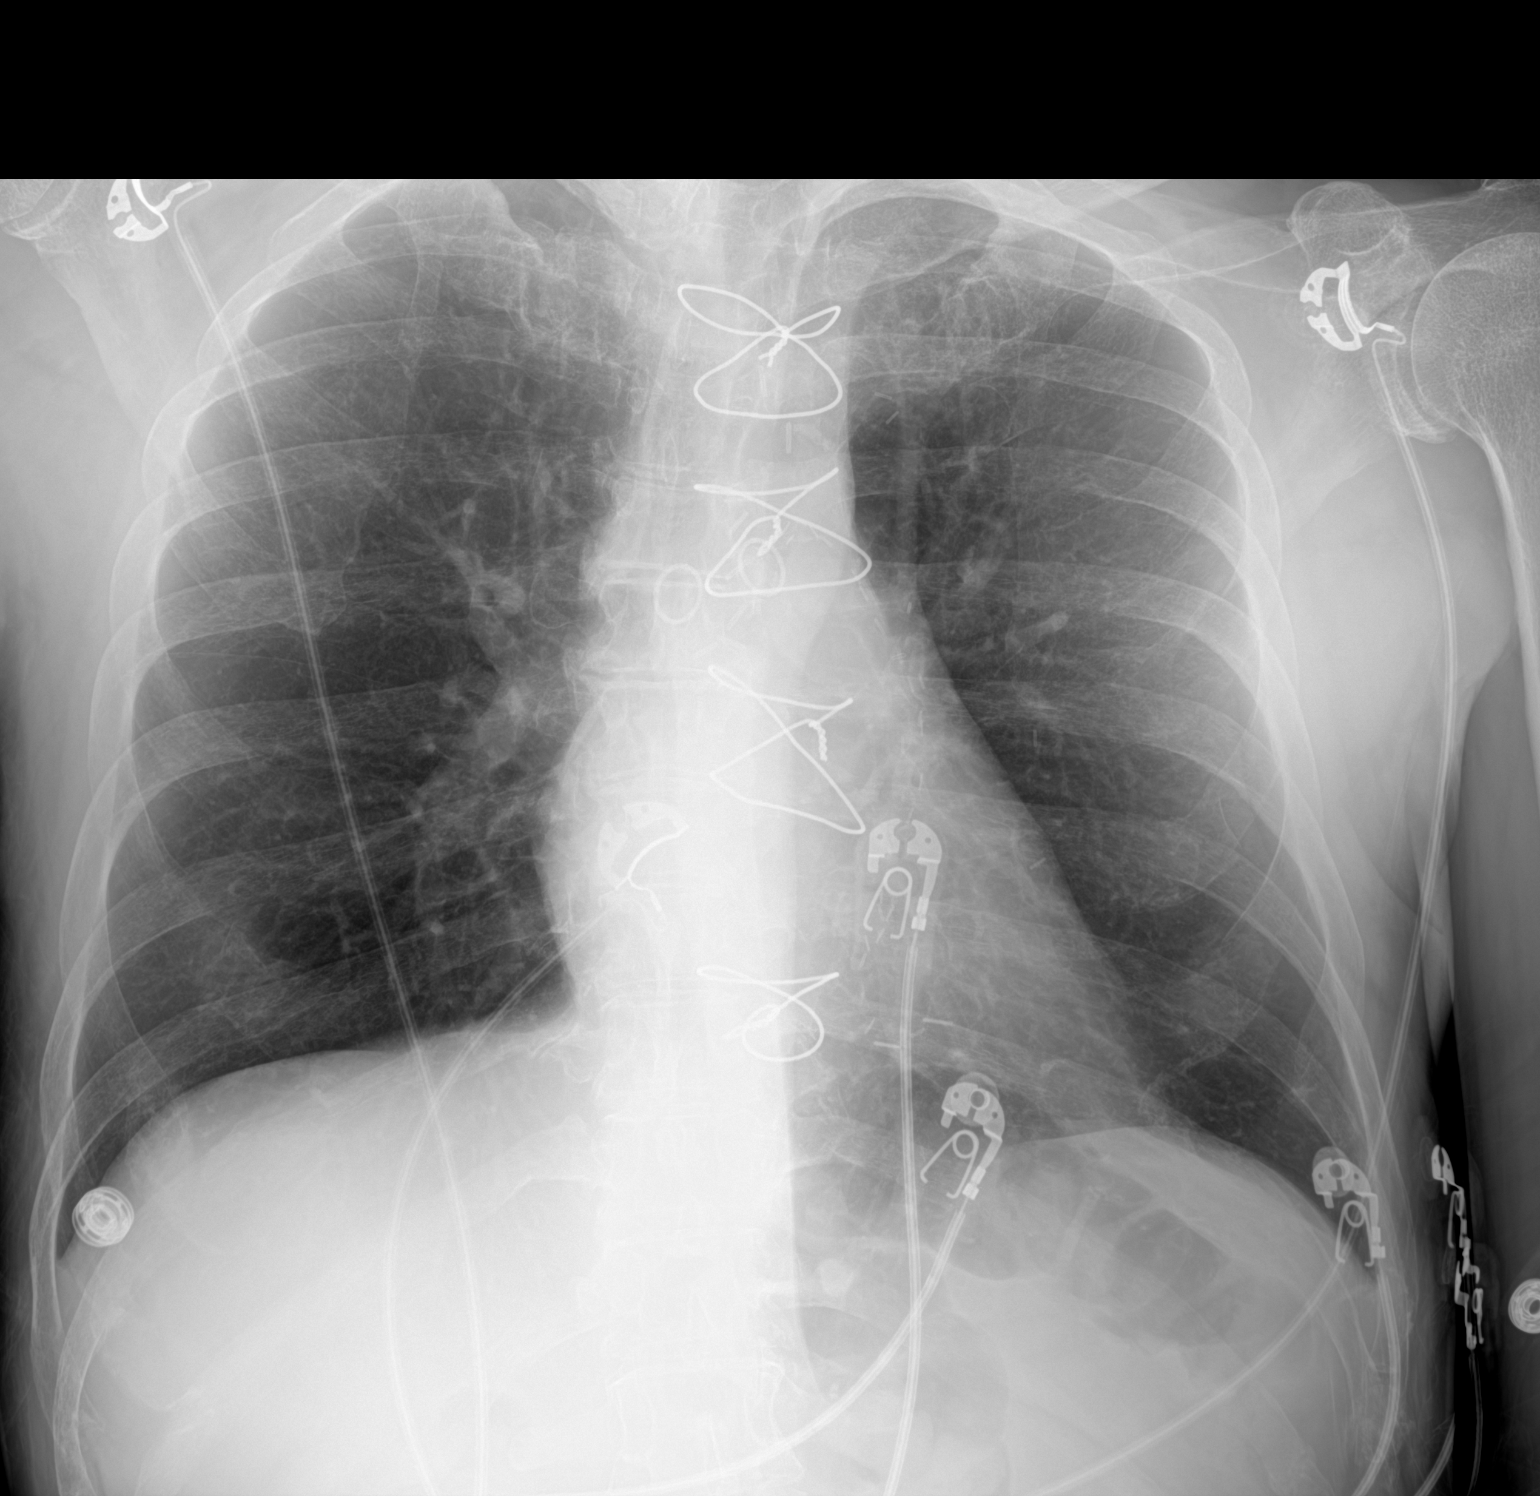

[1 of 1 positions shown; findings below may reference images not displayed]

FINDINGS: Prior median sternotomy and CABG. The heart size and mediastinal
contours are within normal limits. No focal airspace consolidation.
No visible pleural effusion or pneumothorax. Thoracic spondylosis.
IMPRESSION: No acute cardiopulmonary disease.

## 2021-06-30 MED ORDER — LACTATED RINGERS IV BOLUS
1000.0000 mL | Freq: Once | INTRAVENOUS | Status: AC
  Administered 2021-06-30: 1000 mL via INTRAVENOUS

## 2021-06-30 MED ORDER — LACTATED RINGERS IV SOLN: INTRAVENOUS | Status: DC

## 2021-06-30 MED ORDER — VANCOMYCIN HCL IN DEXTROSE 1-5 GM/200ML-% IV SOLN: 1000.0000 mg | INTRAVENOUS | Status: DC

## 2021-06-30 MED ORDER — LACTATED RINGERS IV BOLUS (SEPSIS)
500.0000 mL | Freq: Once | INTRAVENOUS | Status: AC
  Administered 2021-06-30: 500 mL via INTRAVENOUS

## 2021-06-30 MED ORDER — SODIUM CHLORIDE 0.9 % IV SOLN: INTRAVENOUS | Status: DC

## 2021-06-30 MED ORDER — ACETAMINOPHEN 650 MG RE SUPP: 650.0000 mg | Freq: Four times a day (QID) | RECTAL | Status: DC | PRN

## 2021-06-30 MED ORDER — POTASSIUM CHLORIDE 10 MEQ/100ML IV SOLN
10.0000 meq | INTRAVENOUS | Status: AC
  Administered 2021-06-30 – 2021-07-01 (×2): 10 meq via INTRAVENOUS
  Filled 2021-06-30 (×2): qty 100

## 2021-06-30 MED ORDER — VANCOMYCIN HCL 1250 MG/250ML IV SOLN
1250.0000 mg | Freq: Once | INTRAVENOUS | Status: AC
  Administered 2021-06-30: 1250 mg via INTRAVENOUS
  Filled 2021-06-30: qty 250

## 2021-06-30 MED ORDER — INSULIN ASPART 100 UNIT/ML IJ SOLN
0.0000 [IU] | INTRAMUSCULAR | Status: DC
  Administered 2021-07-01 (×2): 2 [IU] via SUBCUTANEOUS
  Administered 2021-07-01 – 2021-07-02 (×2): 1 [IU] via SUBCUTANEOUS
  Administered 2021-07-03: 7 [IU] via SUBCUTANEOUS
  Administered 2021-07-03: 1 [IU] via SUBCUTANEOUS
  Administered 2021-07-03: 3 [IU] via SUBCUTANEOUS
  Administered 2021-07-03: 2 [IU] via SUBCUTANEOUS
  Administered 2021-07-04: 1 [IU] via SUBCUTANEOUS
  Administered 2021-07-04: 5 [IU] via SUBCUTANEOUS
  Administered 2021-07-04: 2 [IU] via SUBCUTANEOUS
  Administered 2021-07-04: 1 [IU] via SUBCUTANEOUS
  Administered 2021-07-04: 5 [IU] via SUBCUTANEOUS
  Administered 2021-07-05 (×2): 1 [IU] via SUBCUTANEOUS

## 2021-06-30 MED ORDER — LACTATED RINGERS IV BOLUS (SEPSIS)
1000.0000 mL | Freq: Once | INTRAVENOUS | Status: AC
  Administered 2021-06-30: 1000 mL via INTRAVENOUS

## 2021-06-30 MED ORDER — ACETAMINOPHEN 325 MG PO TABS: 650.0000 mg | ORAL_TABLET | Freq: Four times a day (QID) | ORAL | Status: DC | PRN

## 2021-06-30 MED ORDER — SODIUM CHLORIDE 0.9% FLUSH
3.0000 mL | Freq: Two times a day (BID) | INTRAVENOUS | Status: DC
  Administered 2021-07-01 – 2021-07-07 (×10): 3 mL via INTRAVENOUS

## 2021-06-30 MED ORDER — SODIUM CHLORIDE 0.9 % IV SOLN
2.0000 g | Freq: Once | INTRAVENOUS | Status: AC
  Administered 2021-06-30: 2 g via INTRAVENOUS
  Filled 2021-06-30: qty 12.5

## 2021-06-30 MED ORDER — HEPARIN SODIUM (PORCINE) 5000 UNIT/ML IJ SOLN
5000.0000 [IU] | Freq: Three times a day (TID) | INTRAMUSCULAR | Status: DC
  Administered 2021-06-30 – 2021-07-07 (×21): 5000 [IU] via SUBCUTANEOUS
  Filled 2021-06-30 (×22): qty 1

## 2021-06-30 MED ORDER — SODIUM CHLORIDE 0.9 % IV SOLN
2.0000 g | Freq: Two times a day (BID) | INTRAVENOUS | Status: DC
  Administered 2021-07-01 – 2021-07-02 (×3): 2 g via INTRAVENOUS
  Filled 2021-06-30 (×3): qty 12.5

## 2021-06-30 MED ORDER — LACTATED RINGERS IV BOLUS (SEPSIS)
250.0000 mL | Freq: Once | INTRAVENOUS | Status: AC
  Administered 2021-06-30: 250 mL via INTRAVENOUS

## 2021-06-30 MED ORDER — METRONIDAZOLE 500 MG/100ML IV SOLN
500.0000 mg | Freq: Once | INTRAVENOUS | Status: AC
  Administered 2021-06-30: 500 mg via INTRAVENOUS
  Filled 2021-06-30: qty 100

## 2021-06-30 NOTE — Progress Notes (Signed)
Patient reevaluated after a low BP reading. MAP has been 60s to 80s, now 68. He is not tachycardic and lactate was normal. He has another liter fluid bolus ordered that has not been given yet. Discussed with RN at bedside, will continue current plan.  ?

## 2021-06-30 NOTE — ED Notes (Signed)
Patient repositioned at this time and updated on the plan of care ?

## 2021-06-30 NOTE — ED Notes (Signed)
Dr. Effie Shy informed of patient's BP 86/54. No new verbal orders received. ?

## 2021-06-30 NOTE — ED Triage Notes (Signed)
Pt arrived via [facility] from home. PT was working w/pt at home and found him to be weak and confused. Per EMS, pt is A&Ox2. Pt told EMS he felt dizzy. Pt initial bp was 76/38, EMS gave NS and bp came up to 86/48. Pt is A&Ox3 to self, place and time. Pt is hypotensive and bradycardic. Dr Effie Shy is aware and at bedside. ?

## 2021-06-30 NOTE — ED Notes (Signed)
Placed pt on bair hugger 

## 2021-06-30 NOTE — Progress Notes (Signed)
Pharmacy Antibiotic Note ? ?Charles [name] is a 61 y.o. male admitted on 06/30/2021 with sepsis.  Pharmacy has been consulted for cefepime and vancomycin dosing. ? ?WBC 6.6, temperature 89.45F at presentation. Will give a loading dose of vancomycin (~20 mg/kg) followed by maintenance dosing: ?Vancomycin 1000 mg IV Q 24 hr ?Scr used: 1.22 mg/dL ?Weight: 56 kg ?Vd coeff: 0.72 L/kg ?Est AUC: 519 ? ?Plan: ?Cefepime 2 g IV q12h ?Vancomycin 1250 mg IV x1 followed by vancomycin 1000 mg q24h  ?F/u clinical course, fever curve, cultures ? ?  ? ?No data recorded. ? ?No results for input(s): WBC, CREATININE, LATICACIDVEN, VANCOTROUGH, VANCOPEAK, VANCORANDOM, GENTTROUGH, GENTPEAK, GENTRANDOM, TOBRATROUGH, TOBRAPEAK, TOBRARND, AMIKACINPEAK, AMIKACINTROU, AMIKACIN in the last 168 hours.  ?CrCl cannot be calculated (Patient's most recent lab result is older than the maximum 21 days allowed.).   ? ?No Known Allergies ? ?Antimicrobials this admission: ?Metronidazole 4/20 x1 ?Vancomycin 4/20 >> ?Cefepime 4/20 >> ? ?Dose adjustments this admission: ?N/A ? ?Microbiology results: ?4/20 BCx: in process ? ? ?Thank you for allowing pharmacy to be a part of this patient?s care. ? ?Zenaida Deed, PharmD ?PGY1 Acute Care Pharmacy Resident  ?Phone: [phone] ?06/30/2021  6:20 PM ? ?Please check [email or web address] for unit-specific pharmacy phone numbers. ? ? ?

## 2021-06-30 NOTE — H&P (Signed)
?History and Physical  ? ? ?Charles [name] QMG:[id number] DOB: 12-11-1960 DOA: 06/30/2021 ? ?PCP: Darreld Mclean, MD  ? ?Patient coming from: Home  ? ?Chief Complaint: Lethargic, confused, low BP  ? ?HPI: Charles [name] is a pleasant 61 y.o. male with medical history significant for insulin-dependent diabetes mellitus, coronary artery disease, and urinary retention with indwelling catheter, now presenting to the emergency department with lethargy and confusion.  Patient's sister spoke with him by phone yesterday morning; patient stated that he was tired at that time and wanted to sleep, but did not have any other complaints.  He was then found by his home health therapist today to be lethargic and confused.  EMS was called, patient was noted to have blood pressure 76/38, he was given 750 cc of IV fluid, and brought into the ED. ? ?ED Course: Upon arrival to the ED, patient is found to have a rectal temperature 31.9 C with bradycardia and systolic pressure as low as 78.  EKG features sinus bradycardia with incomplete LBBB.  Chest x-ray negative for acute cardiopulmonary disease.  Urinalysis concerning for infection.  Chemistry panel notable for glucose 273, bicarbonate 13, and anion gap 20.  Blood and urine cultures were collected, 30 cc/kg LR bolus was given, and patient was treated with vancomycin, cefepime, and Flagyl. ? ?Review of Systems:  ?Unable to complete ROS secondary to patient's clinical condition. ? ?Past Medical History:  ?Diagnosis Date  ? Arthritis   ? Diabetes mellitus without complication ([place])   ? Heart disease   ? Hypertension   ? ? ?Past Surgical History:  ?Procedure Laterality Date  ? CORONARY ARTERY BYPASS GRAFT    ? ? ?Social History:  ? reports that he has never smoked. He has never used smokeless tobacco. He reports that he does not currently use alcohol. He reports that he does not use drugs. ? ?No Known Allergies ? ?Family History  ?Problem Relation Age of Onset  ? Diabetes Mother   ?  Hypertension Mother   ? Diabetes Father   ? Hypertension Father   ? Heart attack Father   ? Diabetes Sister   ? Diabetes Sister   ? ? ? ?Prior to Admission medications   ?Medication Sig Start Date End Date Taking? Authorizing Provider  ?aspirin EC 81 MG tablet Take 81 mg by mouth daily. Swallow whole.    [provider]  ?blood glucose meter kit and supplies Dispense based on patient and insurance preference. Use up to four times daily as directed. (FOR ICD-10 E10.9, E11.9). 12/23/20   de [place], Raymond J, MD  ?clotrimazole-betamethasone (LOTRISONE) cream Apply topically 2 (two) times daily. Apply to foreskin ?Patient taking differently: Apply 1 application. topically 2 (two) times daily. 04/25/21   Rai, Vernelle Emerald, MD  ?Continuous Blood Gluc Sensor (FREESTYLE LIBRE 2 SENSOR) MISC 3 Units by Does not apply route 3 (three) times daily with meals. 04/25/21   Rai, Vernelle Emerald, MD  ?gabapentin (NEURONTIN) 300 MG capsule Take 1 capsule (300 mg total) by mouth at bedtime. ?Patient taking differently: Take 300 mg by mouth daily. 04/13/21   Penumalli, Earlean Polka, MD  ?HUMALOG KWIKPEN 100 UNIT/ML KwikPen Inject 3 Units into the skin 3 (three) times daily before meals. 04/25/21   Rai, Vernelle Emerald, MD  ?Insulin Glargine (BASAGLAR KWIKPEN) 100 UNIT/ML Inject 28 Units into the skin daily. 04/25/21   Rai, Vernelle Emerald, MD  ?metFORMIN (GLUCOPHAGE) 1000 MG tablet TAKE 1 TABLET (1,000 MG TOTAL) BY MOUTH 2 (TWO) TIMES  DAILY WITH A MEAL. 05/15/21   de [place], Blondell Reveal, MD  ?oxybutynin (DITROPAN) 5 MG tablet Take 1 tablet (5 mg total) by mouth every 8 (eight) hours as needed for bladder spasms. 04/25/21   Rai, Vernelle Emerald, MD  ?pantoprazole (PROTONIX) 40 MG tablet Take 1 tablet (40 mg total) by mouth daily. Any generic PPI is okay to dispense ?Patient taking differently: Take 40 mg by mouth daily. 04/25/21   Rai, Vernelle Emerald, MD  ?rosuvastatin (CRESTOR) 40 MG tablet Take 1 tablet (40 mg total) by mouth daily. 04/25/21   Rai, Vernelle Emerald, MD   ?silodosin (RAPAFLO) 8 MG CAPS capsule Take 1 capsule (8 mg total) by mouth daily. 05/19/21   Stoneking, Reece Leader., MD  ? ? ?Physical Exam: ?Vitals:  ? 06/30/21 2115 06/30/21 2130 06/30/21 2200 06/30/21 2212  ?BP: (!) 84/56 (!) 82/67 (!) 88/62   ?Pulse: 70 74 90   ?Resp: 15 18 (!) 23   ?Temp:    (S) 98 ?F (36.7 ?C)  ?TempSrc:    Rectal  ?SpO2: 100% 100% 100%   ?Weight:      ?Height:      ? ? ?Constitutional: No diaphoresis, no pallor  ?Eyes: PERTLA, lids normal ?ENMT: Mucous membranes are dry. Posterior pharynx clear of any exudate or lesions.   ?Neck: supple, no masses  ?Respiratory: clear to auscultation bilaterally, no wheezing, no crackles.   ?Cardiovascular: S1 & S2 heard, regular rate and rhythm. No extremity edema.   ?Abdomen: No distension, no tenderness, soft. Bowel sounds active.  ?Musculoskeletal: no clubbing / cyanosis. No joint deformity upper and lower extremities.   ?Skin: no significant rashes, lesions, ulcers. Warm, dry, well-perfused. ?Neurologic: CN 2-12 grossly intact. Sensation to light touch intact. Moving all extremities. Awake and makes eye-contact but not answering most questions.  ?Psychiatric: Pleasant. Cooperative.  ? ? ?Labs and Imaging on Admission: I have personally reviewed following labs and imaging studies ? ?CBC: ?Recent Labs  ?Lab 06/30/21 ?1637  ?WBC 6.6  ?NEUTROABS 5.1  ?HGB 12.6*  ?HCT 34.7*  ?MCV 83.8  ?PLT 179  ? ?Basic Metabolic Panel: ?Recent Labs  ?Lab 06/30/21 ?1637  ?NA 139  ?K 3.4*  ?CL 106  ?CO2 13*  ?GLUCOSE 273*  ?BUN 28*  ?CREATININE 1.22  ?CALCIUM 8.9  ? ?GFR: ?Estimated Creatinine Clearance: 52.3 mL/min (by C-G formula based on SCr of 1.22 mg/dL). ?Liver Function Tests: ?Recent Labs  ?Lab 06/30/21 ?1637  ?AST 16  ?ALT 17  ?ALKPHOS 121  ?BILITOT 1.0  ?PROT 5.6*  ?ALBUMIN 2.9*  ? ?No results for input(s): LIPASE, AMYLASE in the last 168 hours. ?No results for input(s): AMMONIA in the last 168 hours. ?Coagulation Profile: ?Recent Labs  ?Lab 06/30/21 ?1722  ?INR 1.1   ? ?Cardiac Enzymes: ?No results for input(s): CKTOTAL, CKMB, CKMBINDEX, TROPONINI in the last 168 hours. ?BNP (last 3 results) ?No results for input(s): PROBNP in the last 8760 hours. ?HbA1C: ?No results for input(s): HGBA1C in the last 72 hours. ?CBG: ?No results for input(s): GLUCAP in the last 168 hours. ?Lipid Profile: ?No results for input(s): CHOL, HDL, LDLCALC, TRIG, CHOLHDL, LDLDIRECT in the last 72 hours. ?Thyroid Function Tests: ?No results for input(s): TSH, T4TOTAL, FREET4, T3FREE, THYROIDAB in the last 72 hours. ?Anemia Panel: ?No results for input(s): VITAMINB12, FOLATE, FERRITIN, TIBC, IRON, RETICCTPCT in the last 72 hours. ?Urine analysis: ?   ?Component Value Date/Time  ? [place] YELLOW 06/30/2021 2023  ? APPEARANCEUR CLOUDY (A) 06/30/2021 2023  ? APPEARANCEUR  [place] (A) 05/09/2021 1006  ? LABSPEC 1.010 06/30/2021 2023  ? PHURINE 5.0 06/30/2021 2023  ? GLUCOSEU >=500 (A) 06/30/2021 2023  ? HGBUR MODERATE (A) 06/30/2021 2023  ? [place] NEGATIVE 06/30/2021 2023  ? BILIRUBINUR Negative 05/09/2021 1006  ? KETONESUR 80 (A) 06/30/2021 2023  ? [place] NEGATIVE 06/30/2021 2023  ? NITRITE POSITIVE (A) 06/30/2021 2023  ? LEUKOCYTESUR LARGE (A) 06/30/2021 2023  ? ?Sepsis Labs: ?$RemoveBefore'@LABRCNTIP'[id number]$ (procalcitonin:4,lacticidven:4) ?) ?Recent Results (from the past 240 hour(s))  ?Resp Panel by RT-PCR (Flu A&B, Covid) Nasopharyngeal Swab     Status: None  ? Collection Time: 06/30/21  5:15 PM  ? Specimen: Nasopharyngeal Swab; Nasopharyngeal(NP) swabs in vial transport medium  ?Result Value Ref Range Status  ? SARS Coronavirus 2 by RT PCR NEGATIVE NEGATIVE Final  ?  Comment: (NOTE) ?SARS-CoV-2 target nucleic acids are NOT DETECTED. ? ?The SARS-CoV-2 RNA is generally detectable in upper respiratory ?specimens during the acute phase of infection. The lowest ?concentration of SARS-CoV-2 viral copies this assay can detect is ?138 copies/mL. A negative result does not preclude SARS-Cov-2 ?infection and should not be used  as the sole basis for treatment or ?other patient management decisions. A negative result may occur with  ?improper specimen collection/handling, submission of specimen other ?than nasopharyngeal swab, presence o

## 2021-06-30 NOTE — ED Notes (Signed)
Pt's leg bag was changed to a foley bag. ?

## 2021-06-30 NOTE — ED Notes (Signed)
Dr. Opyd at bedside  

## 2021-06-30 NOTE — ED Notes (Signed)
Dr. Antionette Char informed of patient BP 86/47 MAP 55 ?

## 2021-06-30 NOTE — ED Provider Notes (Signed)
?[facility] ?Provider Note ? ? ?CSN: [id number] ?Arrival date & time: 06/30/21  1633 ? ?  ? ?History ? ?Chief Complaint  ?Patient presents with  ? Hypotension  ? ? ?Charles [name] is a 61 y.o. male. ? ?HPI ?Patient arrives by EMS for evaluation of weakness.  He was found to be hypotensive in the field and treated with IV fluids.  On arrival he is alert and conversant but a somewhat poor historian.  Patient was supposed to see urology yesterday for evaluation of urodynamic status.  He did not go.  He is receiving home health currently.  He did not show up for his last visit with his primary care doctor on 06/08/2021.  He was recently hospitalized and discharged on 06/06/2021.  During the hospitalization he was treated for encephalopathy with hypoglycemia.  His chronic poorly controlled diabetes.  He apparently has a Foley catheter in, since hospital discharge.  Apparently he was placed because of prostatic hypertrophy.  He had a cystoscopy during the recent hospitalization.  He has low vitamin D level, but it apparently has not been treated yet. ? ?Home Medications ?Prior to Admission medications   ?Medication Sig Start Date End Date Taking? Authorizing Provider  ?aspirin EC 81 MG tablet Take 81 mg by mouth daily. Swallow whole.    [provider]  ?blood glucose meter kit and supplies Dispense based on patient and insurance preference. Use up to four times daily as directed. (FOR ICD-10 E10.9, E11.9). 12/23/20   de [place], Raymond J, MD  ?clotrimazole-betamethasone (LOTRISONE) cream Apply topically 2 (two) times daily. Apply to foreskin ?Patient taking differently: Apply 1 application. topically 2 (two) times daily. 04/25/21   Rai, Vernelle Emerald, MD  ?Continuous Blood Gluc Sensor (FREESTYLE LIBRE 2 SENSOR) MISC 3 Units by Does not apply route 3 (three) times daily with meals. 04/25/21   Rai, Vernelle Emerald, MD  ?gabapentin (NEURONTIN) 300 MG capsule Take 1 capsule (300 mg total) by  mouth at bedtime. ?Patient taking differently: Take 300 mg by mouth daily. 04/13/21   Penumalli, Earlean Polka, MD  ?HUMALOG KWIKPEN 100 UNIT/ML KwikPen Inject 3 Units into the skin 3 (three) times daily before meals. 04/25/21   Rai, Vernelle Emerald, MD  ?Insulin Glargine (BASAGLAR KWIKPEN) 100 UNIT/ML Inject 28 Units into the skin daily. 04/25/21   Rai, Ripudeep K, MD  ?metFORMIN (GLUCOPHAGE) 1000 MG tablet TAKE 1 TABLET (1,000 MG TOTAL) BY MOUTH 2 (TWO) TIMES DAILY WITH A MEAL. 05/15/21   de [place], Blondell Reveal, MD  ?oxybutynin (DITROPAN) 5 MG tablet Take 1 tablet (5 mg total) by mouth every 8 (eight) hours as needed for bladder spasms. 04/25/21   Rai, Vernelle Emerald, MD  ?pantoprazole (PROTONIX) 40 MG tablet Take 1 tablet (40 mg total) by mouth daily. Any generic PPI is okay to dispense ?Patient taking differently: Take 40 mg by mouth daily. 04/25/21   Rai, Vernelle Emerald, MD  ?rosuvastatin (CRESTOR) 40 MG tablet Take 1 tablet (40 mg total) by mouth daily. 04/25/21   Rai, Vernelle Emerald, MD  ?silodosin (RAPAFLO) 8 MG CAPS capsule Take 1 capsule (8 mg total) by mouth daily. 05/19/21   Stoneking, Reece Leader., MD  ?   ? ?Allergies    ?Patient has no known allergies.   ? ?Review of Systems   ?Review of Systems ? ?Physical Exam ?Updated Vital Signs ?BP 106/69   Pulse 78   Temp 98.1 ?F (36.7 ?C) (Rectal)   Resp 17   Ht  5' 6" (1.676 m)   Wt 56 kg   SpO2 100%   BMI 19.93 kg/m?  ?Physical Exam ?Vitals and nursing note reviewed.  ?Constitutional:   ?   General: He is not in acute distress. ?   Appearance: He is well-developed. He is not ill-appearing, toxic-appearing or diaphoretic.  ?HENT:  ?   Head: Normocephalic and atraumatic.  ?   Right Ear: External ear normal.  ?   Left Ear: External ear normal.  ?Eyes:  ?   Conjunctiva/sclera: Conjunctivae normal.  ?   Pupils: Pupils are equal, round, and reactive to light.  ?Neck:  ?   Trachea: Phonation normal.  ?Cardiovascular:  ?   Rate and Rhythm: Normal rate and regular rhythm.  ?   Heart sounds: Normal  heart sounds.  ?   Comments: Hypotensive on arrival ?Pulmonary:  ?   Effort: Pulmonary effort is normal.  ?   Breath sounds: Normal breath sounds.  ?Abdominal:  ?   Palpations: Abdomen is soft.  ?   Tenderness: There is no abdominal tenderness.  ?Musculoskeletal:     ?   General: Normal range of motion.  ?   Cervical back: Normal range of motion and neck supple.  ?Skin: ?   General: Skin is warm and dry.  ?Neurological:  ?   Mental Status: He is alert. He is disoriented.  ?   Cranial Nerves: No cranial nerve deficit.  ?   Sensory: No sensory deficit.  ?   Motor: No abnormal muscle tone.  ?   Coordination: Coordination normal.  ?Psychiatric:     ?   Mood and Affect: Mood normal.     ?   Behavior: Behavior normal.  ? ? ?ED Results / Procedures / Treatments   ?Labs ?(all labs ordered are listed, but only abnormal results are displayed) ?Labs Reviewed  ?COMPREHENSIVE METABOLIC PANEL - Abnormal; Notable for the following components:  ?    Result Value  ? Potassium 3.4 (*)   ? CO2 13 (*)   ? Glucose, Bld 273 (*)   ? BUN 28 (*)   ? Total Protein 5.6 (*)   ? Albumin 2.9 (*)   ? Anion gap 20 (*)   ? All other components within normal limits  ?CBC WITH DIFFERENTIAL/PLATELET - Abnormal; Notable for the following components:  ? RBC 4.14 (*)   ? Hemoglobin 12.6 (*)   ? HCT 34.7 (*)   ? MCHC 36.3 (*)   ? Lymphs Abs 0.6 (*)   ? All other components within normal limits  ?URINALYSIS, ROUTINE W REFLEX MICROSCOPIC - Abnormal; Notable for the following components:  ? APPearance CLOUDY (*)   ? Glucose, UA >=500 (*)   ? Hgb urine dipstick MODERATE (*)   ? Ketones, ur 80 (*)   ? Nitrite POSITIVE (*)   ? Leukocytes,Ua LARGE (*)   ? RBC / HPF >50 (*)   ? WBC, UA >50 (*)   ? Bacteria, UA RARE (*)   ? All other components within normal limits  ?APTT - Abnormal; Notable for the following components:  ? aPTT 40 (*)   ? All other components within normal limits  ?RESP PANEL BY RT-PCR (FLU A&B, COVID) [id number]  ?CULTURE, BLOOD (ROUTINE X 2)   ?CULTURE, BLOOD (ROUTINE X 2)  ?URINE CULTURE  ?LACTIC ACID, PLASMA  ?PROTIME-INR  ?BASIC METABOLIC PANEL  ?CBC  ?HEPATIC FUNCTION PANEL  ?MAGNESIUM  ?PHOSPHORUS  ?BASIC METABOLIC PANEL  ?CORTISOL  ?SODIUM, URINE,  RANDOM  ?CREATININE, URINE, RANDOM  ? ? ?EKG ?EKG Interpretation ? ?Date/Time:  Thursday June 30 2021 16:43:57 EDT ?Ventricular Rate:  48 ?PR Interval:  175 ?QRS Duration: 120 ?QT Interval:  488 ?QTC Calculation: 436 ?R Axis:   59 ?Text Interpretation: Sinus bradycardia Incomplete left bundle branch block since last tracing no significant change Confirmed by Daleen Bo [phone]) on 06/30/2021 6:07:13 PM ? ?Radiology ?US RENAL ? ?Result Date: 06/30/2021 ?CLINICAL DATA:  Acute kidney injury. EXAM: RENAL / URINARY TRACT ULTRASOUND COMPLETE COMPARISON:  None. FINDINGS: Right Kidney: Renal measurements: 11.1 cm x 4.6 cm x 4.6 cm = volume: 124.48 mL. Echogenicity within normal limits. No mass or hydronephrosis visualized. Left Kidney: Renal measurements: 10.4 cm x 4.7 cm x 4.9 cm = volume: 126.38 mL. Echogenicity within normal limits. No mass or hydronephrosis visualized. Bladder: A Foley catheter is seen within a distended urinary bladder which also contains echogenic debris. Other: A trace amount of perinephric fluid is seen on the right. IMPRESSION: 1. Trace amount of right-sided perinephric fluid. 2. Echogenic debris within a distended urinary bladder. While this may represent proteinaceous material, small amount of blood products cannot be excluded. Electronically Signed   By: Virgina Norfolk M.D.   On: 06/30/2021 23:06  ? ?DG Chest Port 1 View ? ?Result Date: 06/30/2021 ?CLINICAL DATA:  Questionable sepsis. EXAM: PORTABLE CHEST 1 VIEW COMPARISON:  June 04, 2021 FINDINGS: Prior median sternotomy and CABG. The heart size and mediastinal contours are within normal limits. No focal airspace consolidation. No visible pleural effusion or pneumothorax. Thoracic spondylosis. IMPRESSION: No acute  cardiopulmonary disease. Electronically Signed   By: Dahlia Bailiff M.D.   On: 06/30/2021 17:40   ? ?Procedures ?Procedures  ? ? ?Medications Ordered in ED ?Medications  ?lactated ringers infusion ( Intravenous New Bag/Given 4/20/

## 2021-06-30 NOTE — ED Notes (Signed)
Patient transported to Ultrasound 

## 2021-06-30 NOTE — ED Notes (Signed)
Provider made aware by melanie RN regarding the patients current MAP  ?

## 2021-06-30 NOTE — ED Notes (Addendum)
Provider at bedside at this time in response to [facility]' s secure chat messages  ?

## 2021-06-30 NOTE — Progress Notes (Signed)
Elink following Code Sepsis. 

## 2021-07-01 ENCOUNTER — Inpatient Hospital Stay (HOSPITAL_COMMUNITY): Payer: Commercial Managed Care - HMO

## 2021-07-01 DIAGNOSIS — A419 Sepsis, unspecified organism: Secondary | ICD-10-CM | POA: Diagnosis not present

## 2021-07-01 DIAGNOSIS — G9340 Encephalopathy, unspecified: Secondary | ICD-10-CM | POA: Diagnosis not present

## 2021-07-01 DIAGNOSIS — N179 Acute kidney failure, unspecified: Secondary | ICD-10-CM | POA: Diagnosis not present

## 2021-07-01 DIAGNOSIS — I2510 Atherosclerotic heart disease of native coronary artery without angina pectoris: Secondary | ICD-10-CM | POA: Diagnosis not present

## 2021-07-01 LAB — CREATININE, URINE, RANDOM: Creatinine, Urine: 72.19 mg/dL

## 2021-07-01 LAB — BASIC METABOLIC PANEL
Anion gap: 18 — ABNORMAL HIGH (ref 5–15)
Anion gap: 18 — ABNORMAL HIGH (ref 5–15)
Anion gap: 19 — ABNORMAL HIGH (ref 5–15)
BUN: 20 mg/dL (ref 6–20)
BUN: 21 mg/dL — ABNORMAL HIGH (ref 6–20)
BUN: 24 mg/dL — ABNORMAL HIGH (ref 6–20)
CO2: 11 mmol/L — ABNORMAL LOW (ref 22–32)
CO2: 11 mmol/L — ABNORMAL LOW (ref 22–32)
CO2: 14 mmol/L — ABNORMAL LOW (ref 22–32)
Calcium: 8.1 mg/dL — ABNORMAL LOW (ref 8.9–10.3)
Calcium: 8.4 mg/dL — ABNORMAL LOW (ref 8.9–10.3)
Calcium: 8.4 mg/dL — ABNORMAL LOW (ref 8.9–10.3)
Chloride: 108 mmol/L (ref 98–111)
Chloride: 110 mmol/L (ref 98–111)
Chloride: 110 mmol/L (ref 98–111)
Creatinine, Ser: 1.2 mg/dL (ref 0.61–1.24)
Creatinine, Ser: 1.24 mg/dL (ref 0.61–1.24)
Creatinine, Ser: 1.26 mg/dL — ABNORMAL HIGH (ref 0.61–1.24)
GFR, Estimated: >60 mL/min (ref >60)
GFR, Estimated: >60 mL/min (ref >60)
GFR, Estimated: >60 mL/min (ref >60)
Glucose, Bld: 133 mg/dL — ABNORMAL HIGH (ref 70–99)
Glucose, Bld: 141 mg/dL — ABNORMAL HIGH (ref 70–99)
Glucose, Bld: 198 mg/dL — ABNORMAL HIGH (ref 70–99)
Potassium: 3.3 mmol/L — ABNORMAL LOW (ref 3.5–5.1)
Potassium: 3.3 mmol/L — ABNORMAL LOW (ref 3.5–5.1)
Potassium: 3.5 mmol/L (ref 3.5–5.1)
Sodium: 139 mmol/L (ref 135–145)
Sodium: 140 mmol/L (ref 135–145)
Sodium: 140 mmol/L (ref 135–145)

## 2021-07-01 LAB — CBC
HCT: 30.5 % — ABNORMAL LOW (ref 39.0–52.0)
Hemoglobin: 11.2 g/dL — ABNORMAL LOW (ref 13.0–17.0)
MCH: 30 pg (ref 26.0–34.0)
MCHC: 36.7 g/dL — ABNORMAL HIGH (ref 30.0–36.0)
MCV: 81.8 fL (ref 80.0–100.0)
Platelets: 164 10*3/uL (ref 150–400)
RBC: 3.73 MIL/uL — ABNORMAL LOW (ref 4.22–5.81)
RDW: 15 % (ref 11.5–15.5)
WBC: 6.1 10*3/uL (ref 4.0–10.5)
nRBC: 0 % (ref 0.0–0.2)

## 2021-07-01 LAB — GLUCOSE, CAPILLARY
Glucose-Capillary: 112 mg/dL — ABNORMAL HIGH (ref 70–99)
Glucose-Capillary: 129 mg/dL — ABNORMAL HIGH (ref 70–99)
Glucose-Capillary: 176 mg/dL — ABNORMAL HIGH (ref 70–99)
Glucose-Capillary: 86 mg/dL (ref 70–99)
Glucose-Capillary: 91 mg/dL (ref 70–99)

## 2021-07-01 LAB — HEPATIC FUNCTION PANEL
ALT: 17 U/L (ref 0–44)
AST: 17 U/L (ref 15–41)
Albumin: 2.3 g/dL — ABNORMAL LOW (ref 3.5–5.0)
Alkaline Phosphatase: 100 U/L (ref 38–126)
Bilirubin, Direct: 0.1 mg/dL (ref 0.0–0.2)
Indirect Bilirubin: 1.7 mg/dL — ABNORMAL HIGH (ref 0.3–0.9)
Total Bilirubin: 1.8 mg/dL — ABNORMAL HIGH (ref 0.3–1.2)
Total Protein: 4.5 g/dL — ABNORMAL LOW (ref 6.5–8.1)

## 2021-07-01 LAB — MAGNESIUM
Magnesium: 1.1 mg/dL — ABNORMAL LOW (ref 1.7–2.4)
Magnesium: 2.2 mg/dL (ref 1.7–2.4)

## 2021-07-01 LAB — SODIUM, URINE, RANDOM: Sodium, Ur: 102 mmol/L

## 2021-07-01 LAB — PHOSPHORUS: Phosphorus: 1.8 mg/dL — ABNORMAL LOW (ref 2.5–4.6)

## 2021-07-01 LAB — MRSA NEXT GEN BY PCR, NASAL: MRSA by PCR Next Gen: NOT DETECTED

## 2021-07-01 LAB — CORTISOL: Cortisol, Plasma: 18 ug/dL

## 2021-07-01 LAB — CBG MONITORING, ED: Glucose-Capillary: 174 mg/dL — ABNORMAL HIGH (ref 70–99)

## 2021-07-01 IMAGING — DX DG CHEST 1V PORT SAME DAY
1 series · 1 of 1 positions shown · non-contrast
Comparison: [DATE].

CLINICAL DATA: Shortness of breath.

EXAM:
PORTABLE CHEST 1 VIEW

[chest ap]
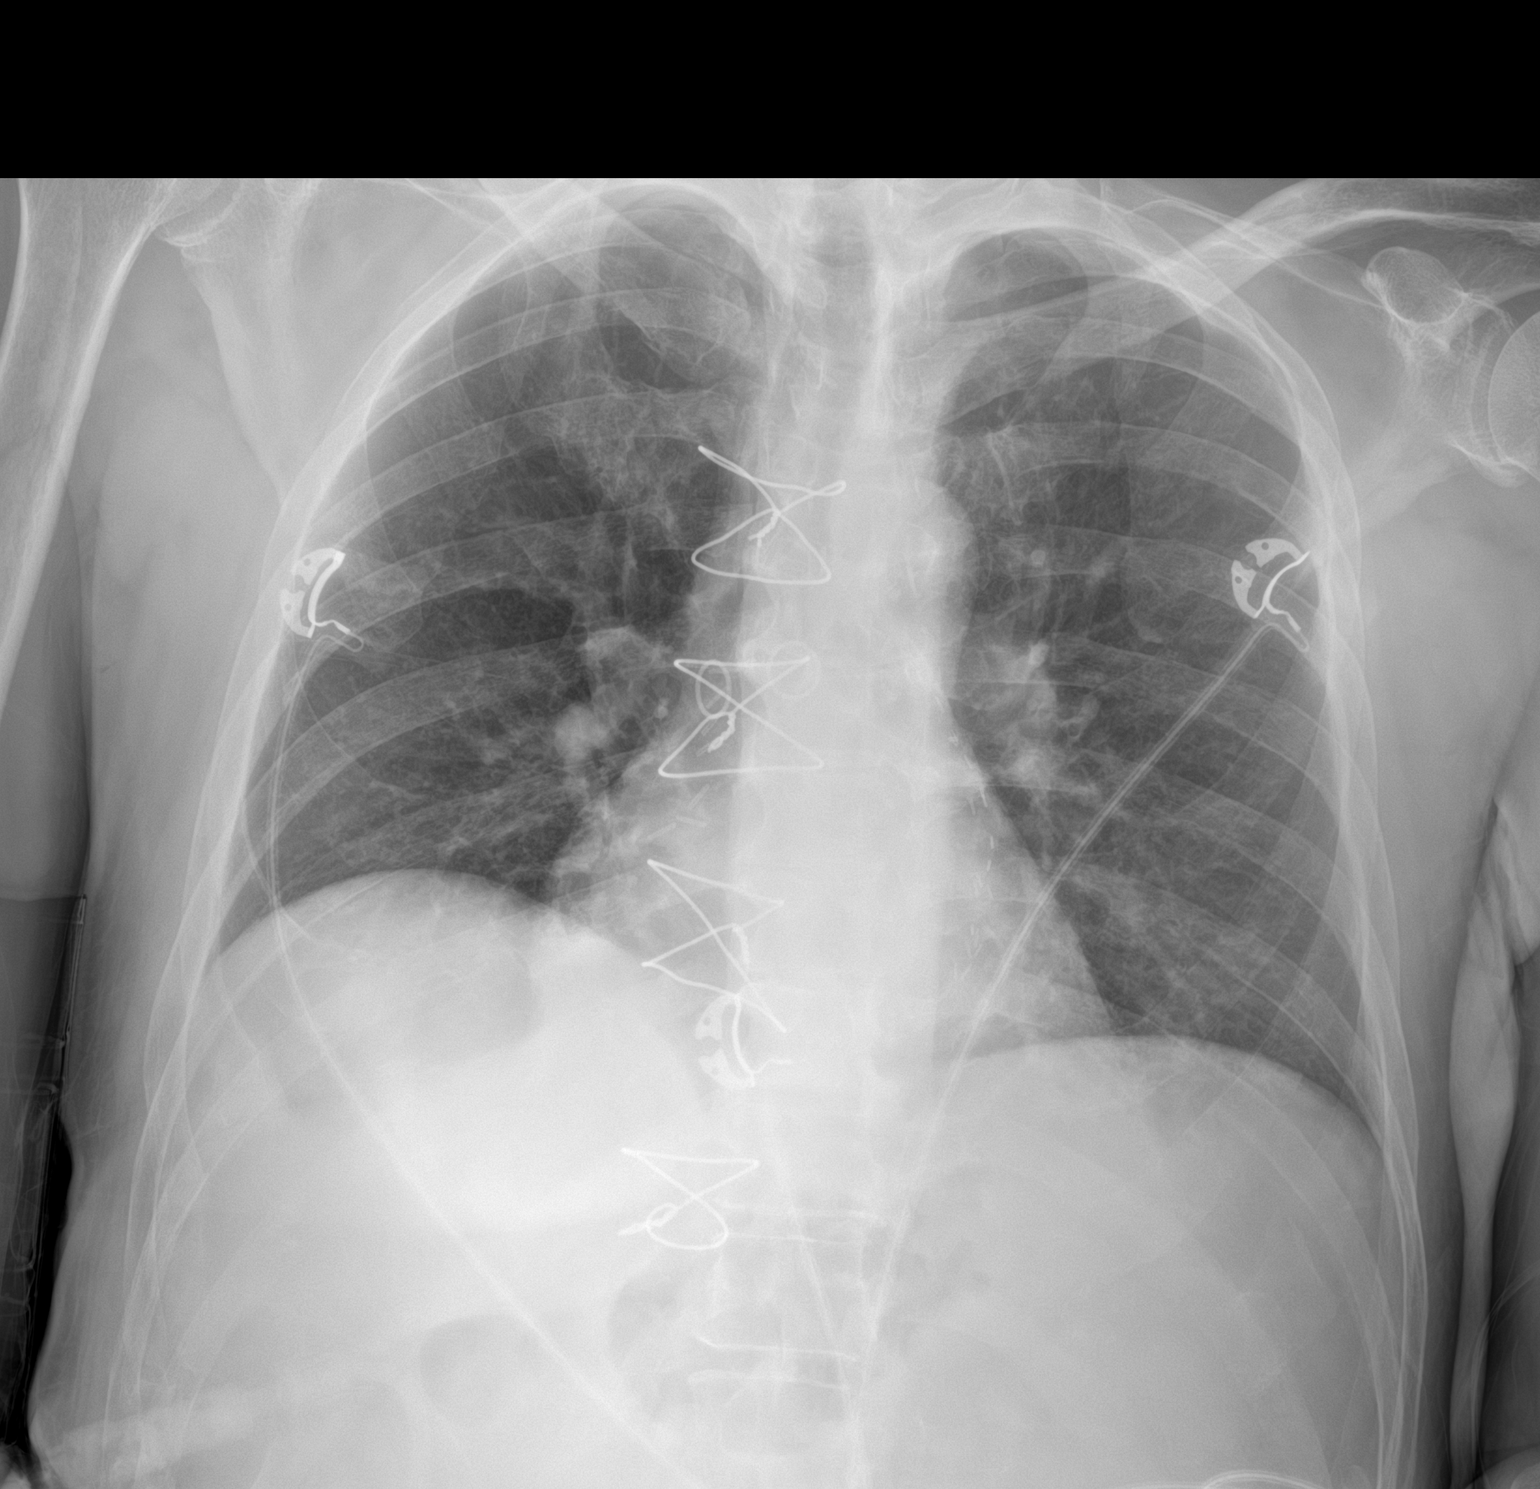

[1 of 1 positions shown; findings below may reference images not displayed]

FINDINGS: No consolidation. No visible pleural effusions or pneumothorax.
Cardiomediastinal silhouette is similar. CABG and median sternotomy.
No acute osseous findings.
IMPRESSION: No evidence of acute cardiopulmonary disease.

## 2021-07-01 MED ORDER — POTASSIUM PHOSPHATES 15 MMOLE/5ML IV SOLN
30.0000 mmol | Freq: Once | INTRAVENOUS | Status: AC
  Administered 2021-07-01: 30 mmol via INTRAVENOUS
  Filled 2021-07-01: qty 10

## 2021-07-01 MED ORDER — STERILE WATER FOR INJECTION IV SOLN
INTRAVENOUS | Status: DC
  Filled 2021-07-01 (×2): qty 1000

## 2021-07-01 MED ORDER — MAGNESIUM SULFATE 4 GM/100ML IV SOLN
4.0000 g | Freq: Once | INTRAVENOUS | Status: AC
  Administered 2021-07-01: 4 g via INTRAVENOUS
  Filled 2021-07-01: qty 100

## 2021-07-01 MED ORDER — STERILE WATER FOR INJECTION IV SOLN
INTRAMUSCULAR | Status: DC
  Filled 2021-07-01: qty 1000

## 2021-07-01 MED ORDER — POTASSIUM CHLORIDE 10 MEQ/100ML IV SOLN
10.0000 meq | INTRAVENOUS | Status: DC
  Administered 2021-07-01 (×2): 10 meq via INTRAVENOUS
  Filled 2021-07-01 (×2): qty 100

## 2021-07-01 MED ORDER — CHLORHEXIDINE GLUCONATE CLOTH 2 % EX PADS
6.0000 | MEDICATED_PAD | Freq: Every day | CUTANEOUS | Status: DC
  Administered 2021-07-02 – 2021-07-06 (×5): 6 via TOPICAL

## 2021-07-01 NOTE — Progress Notes (Signed)
?  Transition of Care (TOC) Screening Note ? ? ?Patient Details  ?Name: Charles [name] ?Date of Birth: 03/03/1961 ? ? ?Transition of Care (TOC) CM/SW Contact:    ?Mearl Latin, LCSW ?Phone Number: ?07/01/2021, 8:53 AM ? ? ? ?Transition of Care Department [facility]) has reviewed patient and no TOC needs have been identified at this time. Recent discharge; was set up with [facility]. We will continue to monitor patient advancement through interdisciplinary progression rounds. If new patient transition needs arise, please place a TOC consult. ? ? ?

## 2021-07-01 NOTE — Progress Notes (Signed)
Patient transferred from ED to [phone]. Patient is alert and oriented to person. Telemetry monitoring enabled, vital signs taken, and IVs assessed for patency. Skin checked with Elpidio Galea. Fall precautions initiated. Patient bed in the locked, lowest position. Non-slip socks in place and bed alarm on. Call bell is within reach. Patient knows to call for assistance prior to getting up and patient demonstrates use. Patient is laying comfortably in bed. ? ?

## 2021-07-01 NOTE — Progress Notes (Signed)
?      ?                 PROGRESS NOTE ? ?      ?PATIENT DETAILS ?Name: Charles [name] ?Age: 61 y.o. ?Sex: male ?Date of Birth: September 06, 1960 ?Admit Date: 06/30/2021 ?Admitting Physician Briscoe Deutscher, MD ?EVO:[id number], [name], MD ? ?Brief Summary: ?Patient is a 61 y.o. [place] male with a history of chronic indwelling Foley catheter, DM-2, CAD s/p CABG, HLD-who presented to the hospital with acute metabolic encephalopathy due to sepsis from complicated UTI.  See below for further details. ? ?Significant events: ?3/25-3/27>> hospitalization for hypoglycemia/encephalopathy-refused SNF. ?4/20>> found at home by home health-lethargic/confused-hypotensive-hypothermic.  Admit to [facility]. ? ?Significant studies: ?4/20>> CXR: No PNA ?4/20>> renal ultrasound: Trace right perinephric fluid, echogenic debris in the distended bladder. ? ?Significant microbiology data: ?4/20>> COVID/influenza PCR: Negative ?4/20>> blood culture: No growth ?4/20>> urine culture: Pending ? ?Procedures: ?None ? ?Consults: ?None  ? ?Subjective: ?Slightly tachypneic-confused-not following commands.  Appears profoundly weak.  Mumbling-after repeated request-appeared to whisper good morning. ? ?Objective: ?Vitals: ?Blood pressure (!) 84/52, pulse 80, temperature (!) 97 ?F (36.1 ?C), temperature source Axillary, resp. rate 20, height 5\' 6"  (1.676 m), weight 54.7 kg, SpO2 100 %.  ? ?Exam: ?Gen Exam: Confused-but not in any distress-slightly tachypneic. ?HEENT:atraumatic, normocephalic ?Chest: B/L clear to auscultation anteriorly ?CVS:S1S2 regular ?Abdomen:soft non tender, non distended ?Extremities:no edema ?Neurology: Non focal ?Skin: no rash ? ?Pertinent Labs/Radiology: ? ?  Latest Ref Rng & Units 07/01/2021  ?  2:13 AM 06/30/2021  ?  4:37 PM 06/05/2021  ?  2:13 AM  ?CBC  ?WBC 4.0 - 10.5 K/uL 6.1   6.6   7.2    ?Hemoglobin 13.0 - 17.0 g/dL 06/07/2021   18.2   99.3    ?Hematocrit 39.0 - 52.0 % 30.5   34.7   32.7    ?Platelets 150 - 400 K/uL 164   179   150    ?  ?Lab  Results  ?Component Value Date  ? NA 139 07/01/2021  ? K 3.3 (L) 07/01/2021  ? CL 110 07/01/2021  ? CO2 11 (L) 07/01/2021  ?  ? ? ?Assessment/Plan: ?Sepsis due to complicated UTI: Blood pressure soft but slowly improving-hypothermia has resolved-continue cefepime-await culture data. ? ?Acute metabolic encephalopathy: Due to sepsis-appears profoundly weak-still confused-does not answer to questions.  Withdraws all 4 extremities to pain.  Continue supportive care-continue treating underlying sepsis-if no improvement over the next few days- will need neuroimaging. ? ?Anion gap metabolic acidosis: Suspect due to combination of sepsis/hypotension/mild renal insufficiency/starvation ketoacidosis/metformin.  Lactic acid normal on initial presentation.  Does not appear to be in DKA as CBGs are in the low 100s range.  Abdominal exam is benign.  Continue treating underlying sepsis-on bicarb infusion-repeating labs in a few hours.  If metabolic acidosis-may need further work-up.   ? ?Hypokalemia/hypomagnesemia/hypophosphatemia: Continue to replete and recheck.  Unclear if he is an alcoholic-his sister does acknowledge some drinking history in the past.  Watch for refeeding syndrome. ? ?CAD s/p CABG: No obvious anginal symptoms. ? ?Insulin-dependent DM-2 (A1c 10.16 April 2021): CBG stable-currently stable on just SSI.  Metformin/Lantus on hold-until he is more awake alert and able to continue oral intake. ? ?Recent Labs  ?  07/01/21 ?0037 07/01/21 ?07/03/21  ?GLUCAP 174* 112*  ?  ?HLD: Hold statin until encephalopathy improves. ? ?BPH/bladder outflow obstruction with chronic indwelling Foley catheter: Per H&P-patient did not follow and missed his appointment  with his urologist recently. ? ?BMI: ?Estimated body mass index is 19.46 kg/m? as calculated from the following: ?  Height as of this encounter: 5\' 6"  (1.676 m). ?  Weight as of this encounter: 54.7 kg.  ? ?Code status: ?  Code Status: Full Code  ? ?DVT Prophylaxis: ?heparin  injection 5,000 Units Start: 06/30/21 2230 ?  ?Family Communication: Sister [name]-[phone]-updated over the phone on 4/21 (she lives in [place]) ? ? ?Disposition Plan: ?Status is: Inpatient ?Remains inpatient appropriate because: Sepsis from complicated UTI-encephalopathy-still very confused-sepsis physiology not yet resolved.  Not stable for discharge. ?  ?Planned Discharge Destination:Skilled nursing facility ? ? ?Diet: ?Diet Order   ? ?       ?  Diet NPO time specified Except for: Ice Chips  Diet effective now       ?  ? ?  ?  ? ?  ?  ? ? ?Antimicrobial agents: ?Anti-infectives (From admission, onward)  ? ? Start     Dose/Rate Route Frequency Ordered Stop  ? 07/01/21 1700  vancomycin (VANCOCIN) IVPB 1000 mg/200 mL premix  Status:  Discontinued       ? 1,000 mg ?200 mL/hr over 60 Minutes Intravenous Every 24 hours 06/30/21 1813 06/30/21 2226  ? 07/01/21 0600  ceFEPIme (MAXIPIME) 2 g in sodium chloride 0.9 % 100 mL IVPB       ? 2 g ?200 mL/hr over 30 Minutes Intravenous Every 12 hours 06/30/21 1813    ? 06/30/21 1645  metroNIDAZOLE (FLAGYL) IVPB 500 mg       ? 500 mg ?100 mL/hr over 60 Minutes Intravenous  Once 06/30/21 1638 06/30/21 1818  ? 06/30/21 1645  ceFEPIme (MAXIPIME) 2 g in sodium chloride 0.9 % 100 mL IVPB       ? 2 g ?200 mL/hr over 30 Minutes Intravenous  Once 06/30/21 1643 06/30/21 1736  ? 06/30/21 1645  vancomycin (VANCOREADY) IVPB 1250 mg/250 mL       ? 1,250 mg ?166.7 mL/hr over 90 Minutes Intravenous  Once 06/30/21 1643 06/30/21 1926  ? ?  ? ? ? ?MEDICATIONS: ?Scheduled Meds: ? heparin  5,000 Units Subcutaneous Q8H  ? insulin aspart  0-9 Units Subcutaneous Q4H  ? sodium chloride flush  3 mL Intravenous Q12H  ? ?Continuous Infusions: ? ceFEPime (MAXIPIME) IV Stopped (07/01/21 0545)  ? potassium PHOSPHATE IVPB (in mmol) 30 mmol (07/01/21 0907)  ?  sodium bicarbonate (isotonic) infusion in sterile water 125 mL/hr at 07/01/21 0700  ? ?PRN Meds:.acetaminophen **OR** acetaminophen ? ? ?I have  personally reviewed following labs and imaging studies ? ?LABORATORY DATA: ?CBC: ?Recent Labs  ?Lab 06/30/21 ?1637 07/01/21 ?0213  ?WBC 6.6 6.1  ?NEUTROABS 5.1  --   ?HGB 12.6* 11.2*  ?HCT 34.7* 30.5*  ?MCV 83.8 81.8  ?PLT 179 164  ? ? ?Basic Metabolic Panel: ?Recent Labs  ?Lab 06/30/21 ?1637 06/30/21 ?2337 07/01/21 ?[id number]  ?NA 139 140 139  ?K 3.4* 3.3* 3.3*  ?CL 106 110 110  ?CO2 13* 11* 11*  ?GLUCOSE 273* 198* 133*  ?BUN 28* 24* 21*  ?CREATININE 1.22 1.20 1.26*  ?CALCIUM 8.9 8.4* 8.4*  ?MG  --   --  1.1*  ?PHOS  --   --  1.8*  ? ? ?GFR: ?Estimated Creatinine Clearance: 49.4 mL/min (A) (by C-G formula based on SCr of 1.26 mg/dL (H)). ? ?Liver Function Tests: ?Recent Labs  ?Lab 06/30/21 ?1637 07/01/21 ?0213  ?AST 16 17  ?ALT 17 17  ?ALKPHOS 121  100  ?BILITOT 1.0 1.8*  ?PROT 5.6* 4.5*  ?ALBUMIN 2.9* 2.3*  ? ?No results for input(s): LIPASE, AMYLASE in the last 168 hours. ?No results for input(s): AMMONIA in the last 168 hours. ? ?Coagulation Profile: ?Recent Labs  ?Lab 06/30/21 ?1722  ?INR 1.1  ? ? ?Cardiac Enzymes: ?No results for input(s): CKTOTAL, CKMB, CKMBINDEX, TROPONINI in the last 168 hours. ? ?BNP (last 3 results) ?No results for input(s): PROBNP in the last 8760 hours. ? ?Lipid Profile: ?No results for input(s): CHOL, HDL, LDLCALC, TRIG, CHOLHDL, LDLDIRECT in the last 72 hours. ? ?Thyroid Function Tests: ?No results for input(s): TSH, T4TOTAL, FREET4, T3FREE, THYROIDAB in the last 72 hours. ? ?Anemia Panel: ?No results for input(s): VITAMINB12, FOLATE, FERRITIN, TIBC, IRON, RETICCTPCT in the last 72 hours. ? ?Urine analysis: ?   ?Component Value Date/Time  ? COLORURINE YELLOW 06/30/2021 2023  ? APPEARANCEUR CLOUDY (A) 06/30/2021 2023  ? APPEARANCEUR Hazy (A) 05/09/2021 1006  ? LABSPEC 1.010 06/30/2021 2023  ? PHURINE 5.0 06/30/2021 2023  ? GLUCOSEU >=500 (A) 06/30/2021 2023  ? HGBUR MODERATE (A) 06/30/2021 2023  ? BILIRUBINUR NEGATIVE 06/30/2021 2023  ? BILIRUBINUR Negative 05/09/2021 1006  ? KETONESUR 80  (A) 06/30/2021 2023  ? PROTEINUR NEGATIVE 06/30/2021 2023  ? NITRITE POSITIVE (A) 06/30/2021 2023  ? LEUKOCYTESUR LARGE (A) 06/30/2021 2023  ? ? ?Sepsis Labs: ?Lactic Acid, Venous ?   ?Component Value Date/Tim

## 2021-07-01 NOTE — Progress Notes (Signed)
?   07/01/21 0600  ?Assess: MEWS Score  ?Temp 97.8 ?F (36.6 ?C)  ?BP (!) 87/57  ?Pulse Rate 76  ?ECG Heart Rate 76  ?Resp (!) 26  ?Level of Consciousness Alert  ?SpO2 100 %  ?O2 Device Room Air  ?Assess: MEWS Score  ?MEWS Temp 0  ?MEWS Systolic 1  ?MEWS Pulse 0  ?MEWS RR 2  ?MEWS LOC 0  ?MEWS Score 3  ?MEWS Score Color Yellow  ?Assess: if the MEWS score is Yellow or Red  ?Were vital signs taken at a resting state? Yes  ?Focused Assessment No change from prior assessment  ?Early Detection of Sepsis Score *See Row Information* Medium  ?MEWS guidelines implemented *See Row Information* Yes  ?Treat  ?MEWS Interventions Other (Comment);Administered scheduled meds/treatments  ?Pain Scale PAINAD  ?Breathing 1  ?Negative Vocalization 0  ?Facial Expression 0  ?Body Language 1  ?Consolability 0  ?PAINAD Score 2  ?Take Vital Signs  ?Increase Vital Sign Frequency  Yellow: Q 2hr X 2 then Q 4hr X 2, if remains yellow, continue Q 4hrs  ?Escalate  ?MEWS: Escalate Yellow: discuss with charge nurse/RN and consider discussing with provider and RRT  ?Notify: Charge Nurse/RN  ?Name of Charge Nurse/RN Notified Loreta Ave RN  ?Date Charge Nurse/RN Notified 07/01/21  ?Time Charge Nurse/RN Notified 0600  ?Notify: Provider  ?Provider Name/Title Howerter MD  ?Date Provider Notified 07/01/21  ?Time Provider Notified 0600  ?Notification Type Page  ?Notification Reason Other (Comment) ?(yellow MEWS)  ?Provider response See new orders  ?Document  ?Patient Outcome Not stable and remains on department  ?Progress note created (see row info) Yes  ? ? ?

## 2021-07-01 NOTE — ED Notes (Signed)
ER staff nurses transporting patient up to room at this time  ?

## 2021-07-02 ENCOUNTER — Inpatient Hospital Stay (HOSPITAL_COMMUNITY): Payer: Commercial Managed Care - HMO

## 2021-07-02 DIAGNOSIS — N390 Urinary tract infection, site not specified: Secondary | ICD-10-CM | POA: Diagnosis not present

## 2021-07-02 DIAGNOSIS — A419 Sepsis, unspecified organism: Secondary | ICD-10-CM | POA: Diagnosis not present

## 2021-07-02 DIAGNOSIS — L8990 Pressure ulcer of unspecified site, unspecified stage: Secondary | ICD-10-CM | POA: Insufficient documentation

## 2021-07-02 LAB — COMPREHENSIVE METABOLIC PANEL
ALT: 20 U/L (ref 0–44)
AST: 27 U/L (ref 15–41)
Albumin: 2.1 g/dL — ABNORMAL LOW (ref 3.5–5.0)
Alkaline Phosphatase: 96 U/L (ref 38–126)
Anion gap: 14 (ref 5–15)
BUN: 11 mg/dL (ref 6–20)
CO2: 24 mmol/L (ref 22–32)
Calcium: 7.4 mg/dL — ABNORMAL LOW (ref 8.9–10.3)
Chloride: 100 mmol/L (ref 98–111)
Creatinine, Ser: 0.91 mg/dL (ref 0.61–1.24)
GFR, Estimated: >60 mL/min (ref >60)
Glucose, Bld: 94 mg/dL (ref 70–99)
Potassium: 2.7 mmol/L — CL (ref 3.5–5.1)
Sodium: 138 mmol/L (ref 135–145)
Total Bilirubin: 1 mg/dL (ref 0.3–1.2)
Total Protein: 4.6 g/dL — ABNORMAL LOW (ref 6.5–8.1)

## 2021-07-02 LAB — GLUCOSE, CAPILLARY
Glucose-Capillary: 104 mg/dL — ABNORMAL HIGH (ref 70–99)
Glucose-Capillary: 107 mg/dL — ABNORMAL HIGH (ref 70–99)
Glucose-Capillary: 120 mg/dL — ABNORMAL HIGH (ref 70–99)
Glucose-Capillary: 122 mg/dL — ABNORMAL HIGH (ref 70–99)
Glucose-Capillary: 91 mg/dL (ref 70–99)
Glucose-Capillary: 95 mg/dL (ref 70–99)

## 2021-07-02 LAB — URINE CULTURE

## 2021-07-02 LAB — CBC
HCT: 29.9 % — ABNORMAL LOW (ref 39.0–52.0)
Hemoglobin: 11.2 g/dL — ABNORMAL LOW (ref 13.0–17.0)
MCH: 30.9 pg (ref 26.0–34.0)
MCHC: 37.5 g/dL — ABNORMAL HIGH (ref 30.0–36.0)
MCV: 82.6 fL (ref 80.0–100.0)
Platelets: 124 10*3/uL — ABNORMAL LOW (ref 150–400)
RBC: 3.62 MIL/uL — ABNORMAL LOW (ref 4.22–5.81)
RDW: 15.5 % (ref 11.5–15.5)
WBC: 10.4 10*3/uL (ref 4.0–10.5)
nRBC: 0 % (ref 0.0–0.2)

## 2021-07-02 LAB — PROCALCITONIN: Procalcitonin: <0.1 ng/mL

## 2021-07-02 LAB — PHOSPHORUS: Phosphorus: 3.5 mg/dL (ref 2.5–4.6)

## 2021-07-02 LAB — MAGNESIUM: Magnesium: 1.5 mg/dL — ABNORMAL LOW (ref 1.7–2.4)

## 2021-07-02 IMAGING — CT CT HEAD W/O CM
3 of 4 series · 13 of 47 positions shown, 15 images · non-contrast
Comparison: [DATE]

CLINICAL DATA: Delirium



[Series 2: head without · axial · non-contrast · 0.39mm/px · z∈[+1339,+1459]mm · 7 of 34 slices shown, 9 images]
[im 5/34  brain]
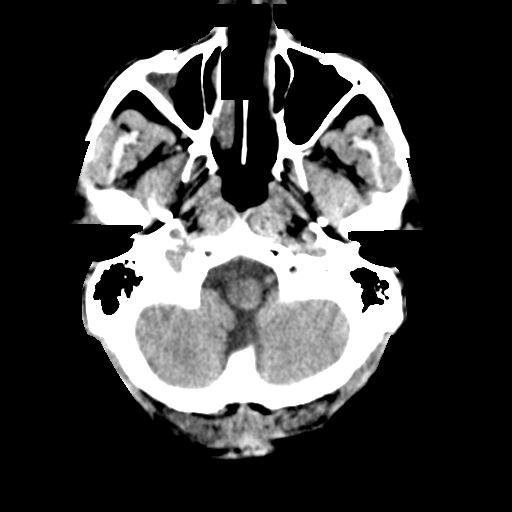
[im 5/34  bone]
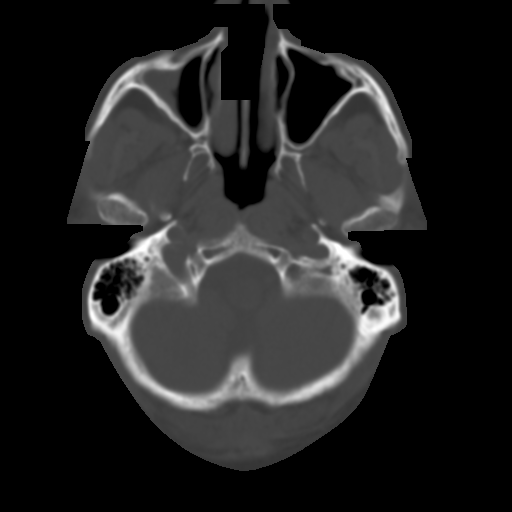
[im 9/34  brain]
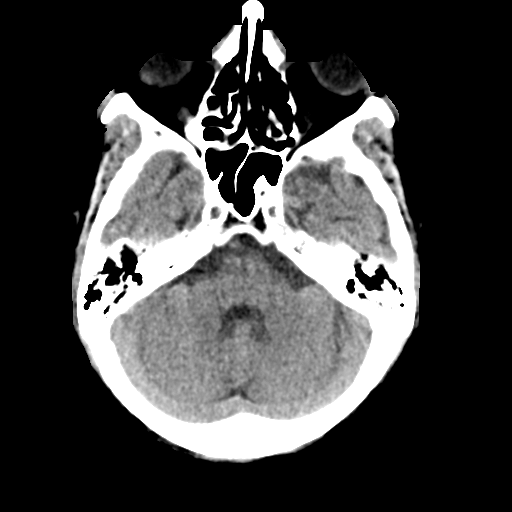
[im 13/34  brain]
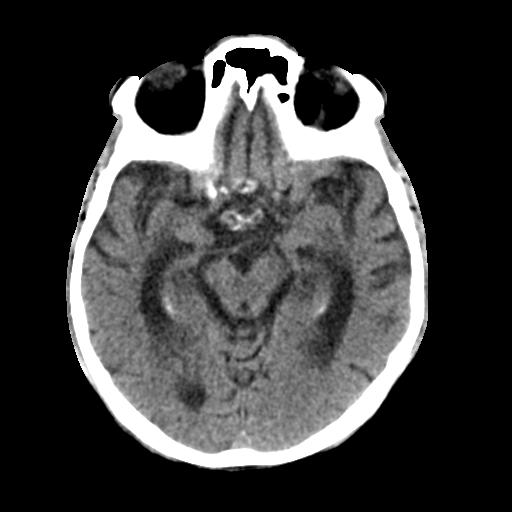
[im 17/34  brain]
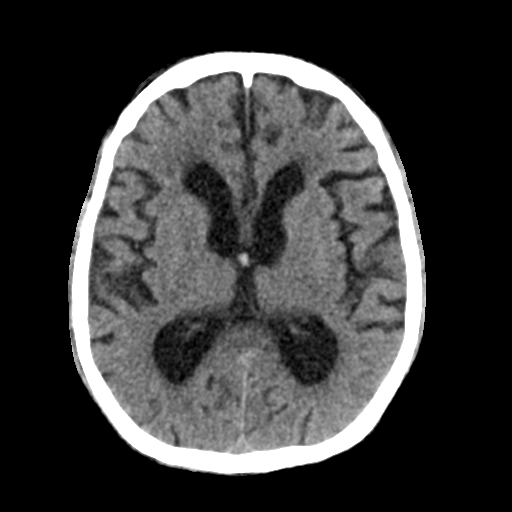
[im 21/34  brain]
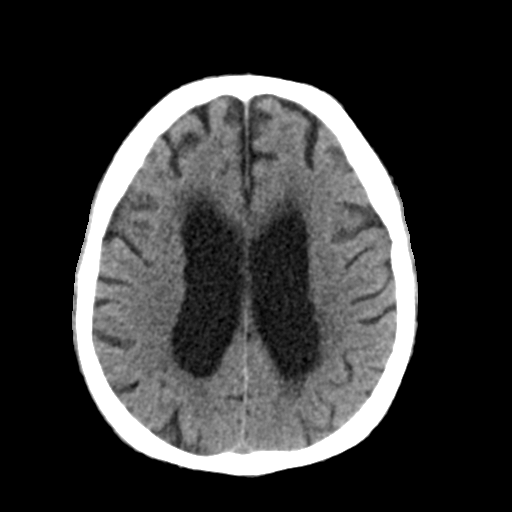
[im 21/34  bone]
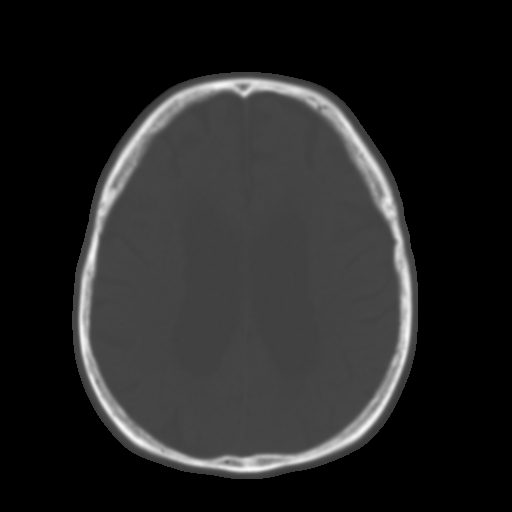
[im 25/34  brain]
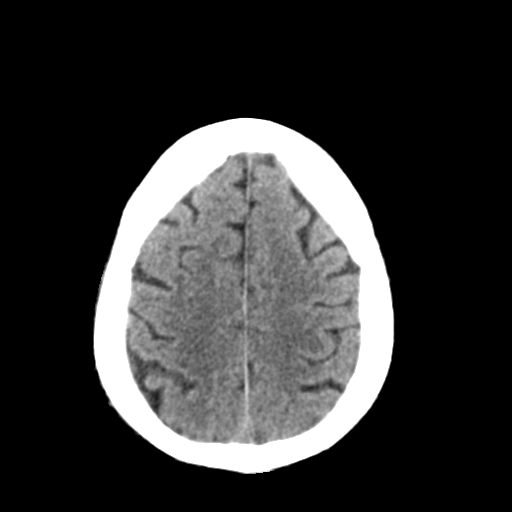
[im 29/34  brain]
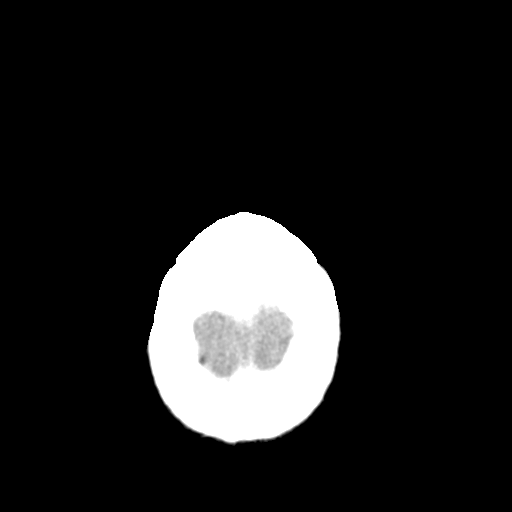

[Series 4: head without cor · coronal · non-contrast · 0.33mm/px · 3 of 79 slices shown]
[im 27/79  brain]
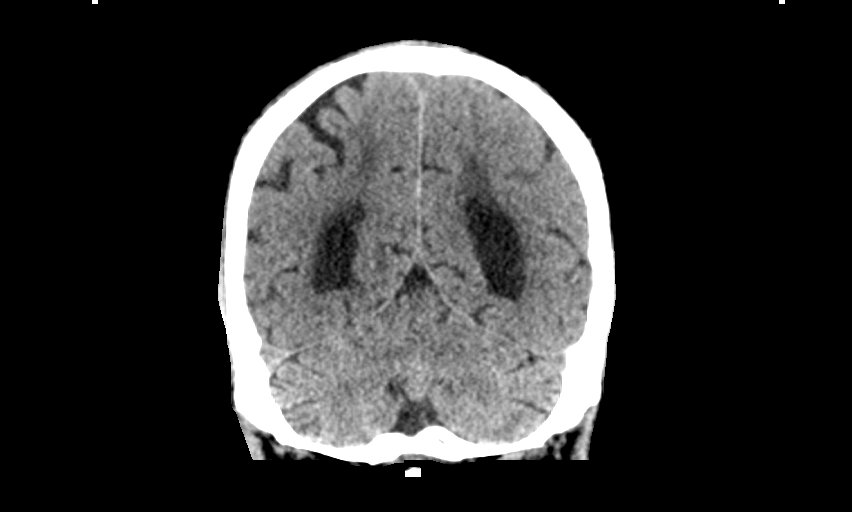
[im 35/79  brain]
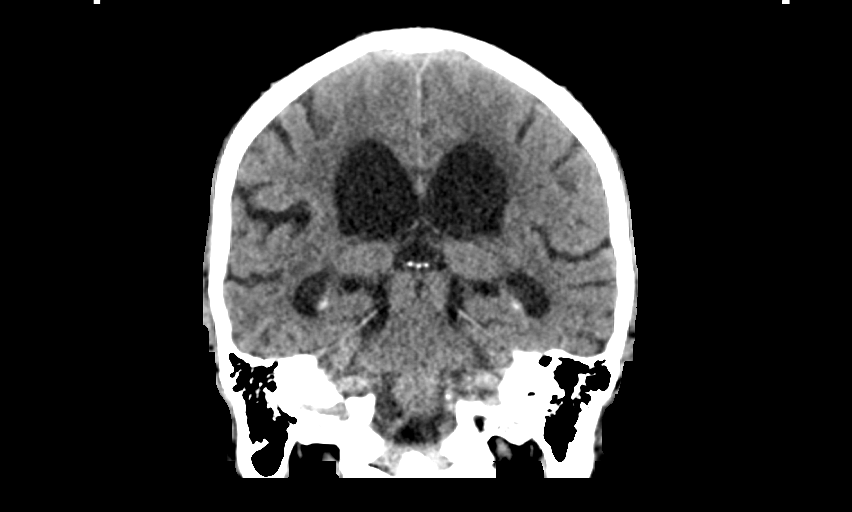
[im 44/79  brain]
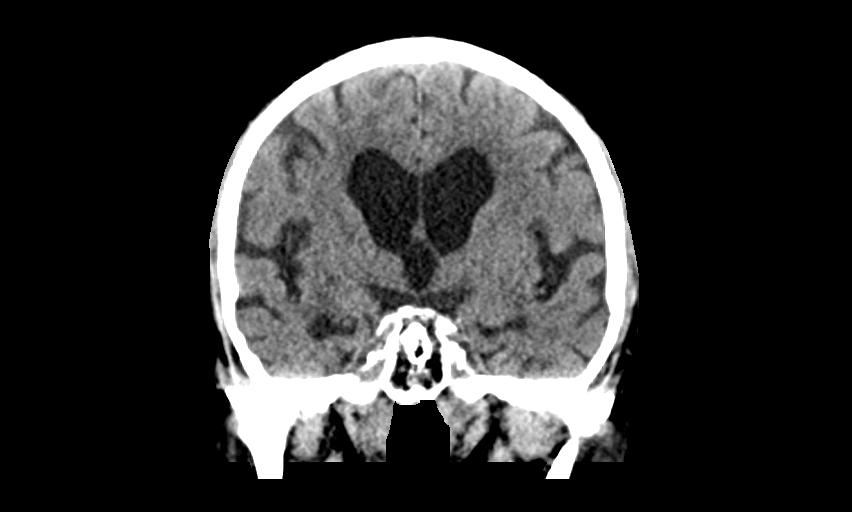

[Series 5: head without sag · sagittal · non-contrast · 0.33mm/px · 3 of 67 slices shown]
[im 23/67  brain]
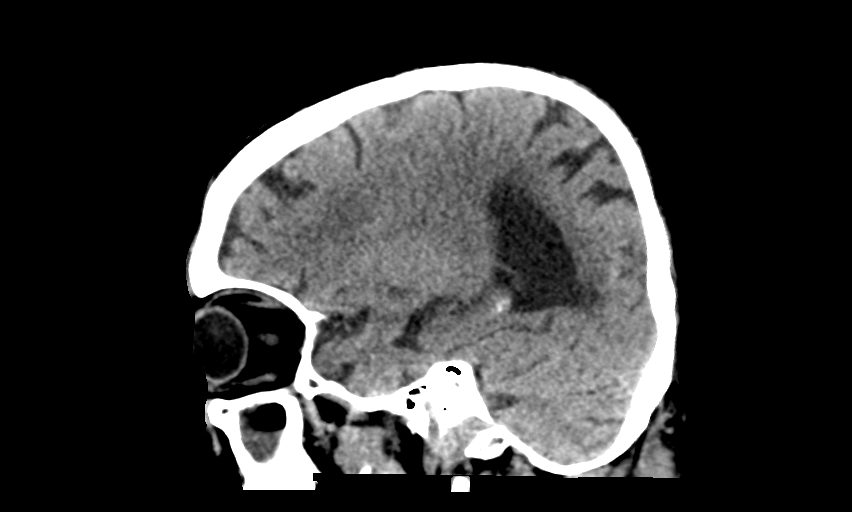
[im 34/67  brain]
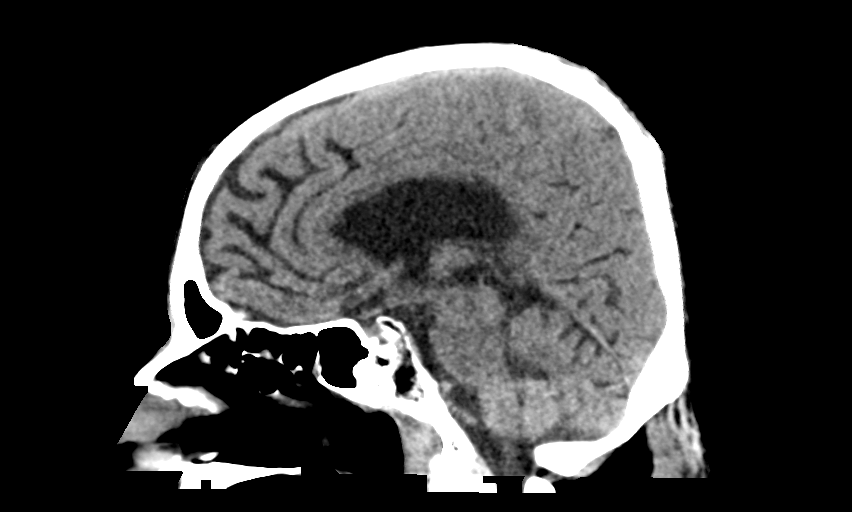
[im 45/67  brain]
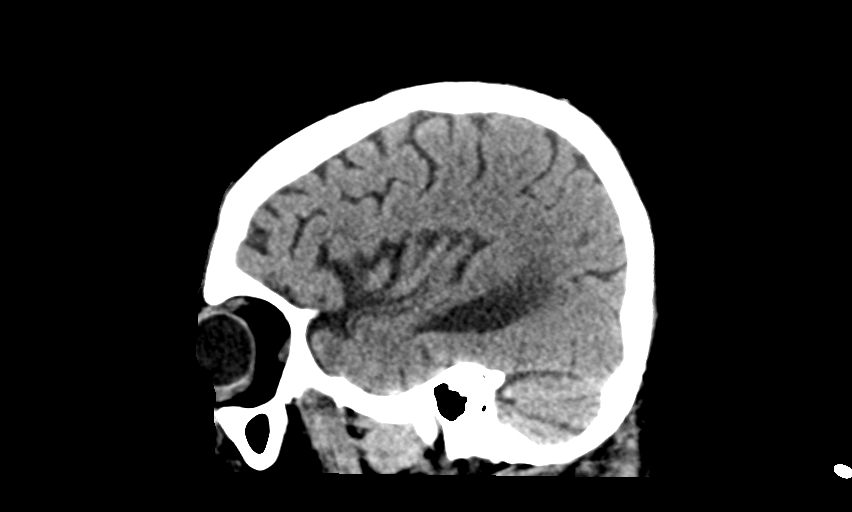

[13 of 47 positions shown; findings below may reference images not displayed]

FINDINGS: Brain: Premature brain atrophy with ventriculomegaly that has not
progressed since most remote comparison imaging [DATE].
Ventriculomegaly is seen both proximal and beyond a 5 mm colloid
cyst. No infarct, acute hemorrhage, or collection. Periventricular
low-density which is likely chronic small vessel ischemia given
medical history.

Vascular: No hyperdense vessel or unexpected calcification.

Skull: Normal. Negative for fracture or focal lesion.

Sinuses/Orbits: No acute finding.
IMPRESSION: 1. Stable compared to prior.
2. Premature brain atrophy with ventriculomegaly. Premature chronic
small vessel ischemia.
3. 5 mm colloid cyst.

## 2021-07-02 IMAGING — DX DG CHEST 1V PORT
1 series · 1 of 1 positions shown · non-contrast
Comparison: Yesterday prior CABG

CLINICAL DATA: Shortness of breath

EXAM:
PORTABLE CHEST 1 VIEW

[chest]
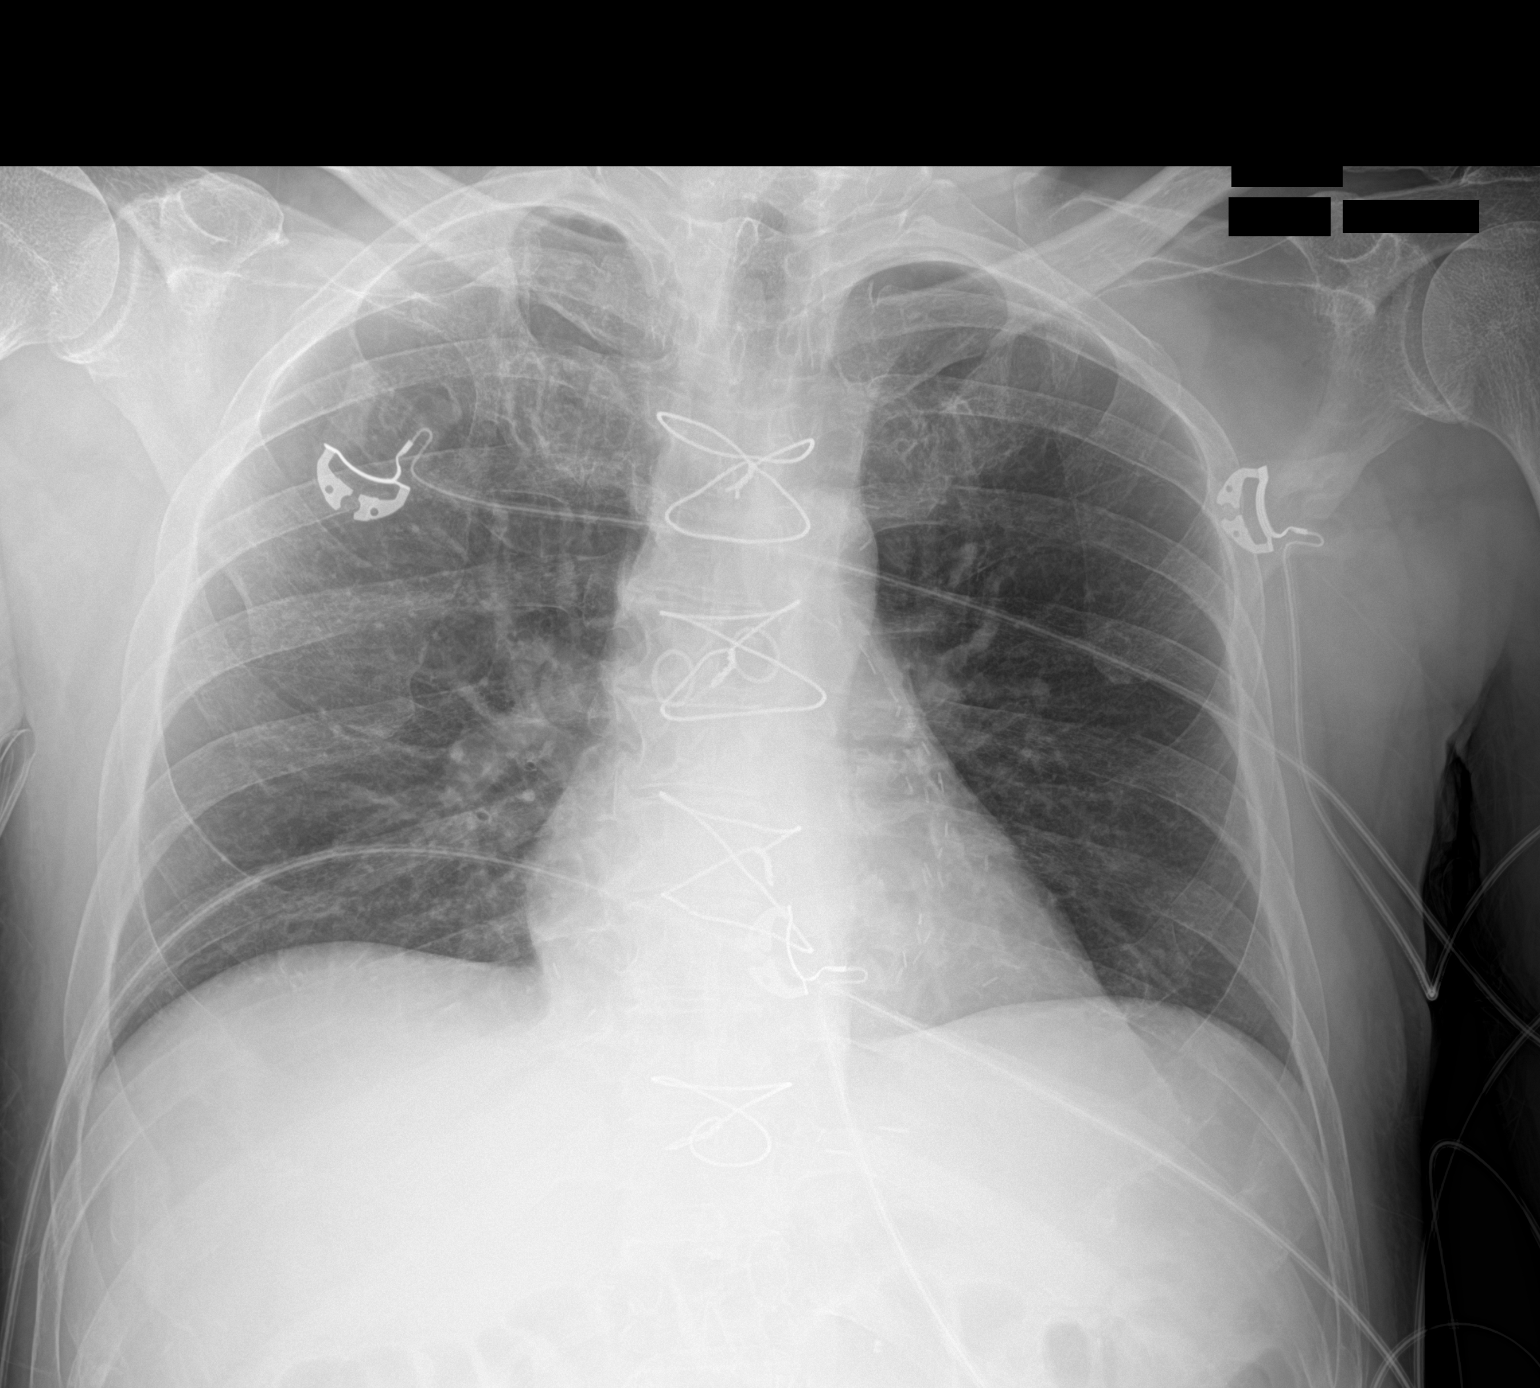

[1 of 1 positions shown; findings below may reference images not displayed]

FINDINGS: Normal heart size and mediastinal contours. No acute infiltrate or
edema. No effusion or pneumothorax. No acute osseous findings.
IMPRESSION: No evidence of active disease.

## 2021-07-02 MED ORDER — LACTATED RINGERS IV BOLUS
1000.0000 mL | Freq: Once | INTRAVENOUS | Status: AC
  Administered 2021-07-02: 1000 mL via INTRAVENOUS

## 2021-07-02 MED ORDER — SODIUM CHLORIDE 0.9 % IV SOLN
2.0000 g | Freq: Three times a day (TID) | INTRAVENOUS | Status: AC
  Administered 2021-07-02 – 2021-07-06 (×14): 2 g via INTRAVENOUS
  Filled 2021-07-02 (×14): qty 12.5

## 2021-07-02 MED ORDER — MAGNESIUM SULFATE 2 GM/50ML IV SOLN
2.0000 g | Freq: Once | INTRAVENOUS | Status: AC
  Administered 2021-07-02: 2 g via INTRAVENOUS
  Filled 2021-07-02: qty 50

## 2021-07-02 MED ORDER — NAPHAZOLINE-GLYCERIN 0.012-0.25 % OP SOLN
2.0000 [drp] | Freq: Three times a day (TID) | OPHTHALMIC | Status: DC
  Administered 2021-07-02 – 2021-07-03 (×3): 2 [drp] via OPHTHALMIC
  Filled 2021-07-02: qty 15

## 2021-07-02 MED ORDER — POTASSIUM CHLORIDE 10 MEQ/100ML IV SOLN
10.0000 meq | INTRAVENOUS | Status: AC
  Administered 2021-07-02 (×4): 10 meq via INTRAVENOUS
  Filled 2021-07-02 (×4): qty 100

## 2021-07-02 MED ORDER — LACTATED RINGERS IV SOLN
INTRAVENOUS | Status: AC
  Filled 2021-07-02 (×2): qty 1000

## 2021-07-02 MED ORDER — MAGNESIUM SULFATE 2 GM/50ML IV SOLN
2.0000 g | Freq: Once | INTRAVENOUS | Status: AC
Start: 2021-07-02 — End: 2021-07-02
  Administered 2021-07-02: 2 g via INTRAVENOUS
  Filled 2021-07-02: qty 50

## 2021-07-02 NOTE — Progress Notes (Signed)
[facility] night cross cover note: ? ?I was notified by RN with updates that include morning lab results notable for serum potassium of 2.7, improving serum bicarbonate level.  ? ?Per my chart review, including review of most recent rounding hospitalist progress note, this is a 61 year old male who was admitted with acute encephalopathy in the setting of severe sepsis due to urinary tract infection, on IV cefepime.  Hospitalization complicated by anion gap metabolic acidosis, for which the patient has been receiving bicarbonate drip.  ? ?Per review of this morning's labs, CMP also notable for interval resolution of previously noted anion gap metabolic acidosis, with updated bicarbonate noted to be 24, with anion gap 14.  Additionally, interval improvement in renal function.  Serum magnesium level low at 1.5.  ? ?In light of these findings, I have held bicarbonate drip for now, ordered potassium chloride 40 mEq IV over 4 hours, magnesium sulfate 2 g IV over 2 hours, ordered repeat BMP to be checked at 9 AM this morning to further trend acid-base status and evaluate for need for resumption of bicarbonate drip, and assess adequacy of aforementioned potassium supplementation efforts.. ? ? ? ?Babs Bertin, DO ?Hospitalist ? ?

## 2021-07-02 NOTE — Evaluation (Signed)
Occupational Therapy Evaluation ?Patient Details ?Name: Charles [name] ?MRN: [id number] ?DOB: 1960/08/10 ?Today's Date: 07/02/2021 ? ? ?History of Present Illness 61 y/o male presented to ED on 06/30/21 for weakness and confusion with HHPT. Found to be hypotensive and bradycardic with EMS. Admitted for severe sepsis and UTI. Recent admission 3/25-3/27 for hypoglycemic event and unresponsiveness - d/c'ed home despite SNF recommendation. PMH: with medical history significant of HTN, CAD status post CABG, BPH, diabetes  ? ?Clinical Impression ?  ?Pt with cognitive deficits this session, lethargy. Pt unreliable historian at this time. FRom chart review, Pt was mod I at home and participating in Fults. Today Pt is max A to total A +2 for all aspects of ADL. He follows commands about 25% of the time, does not initiate, will not track visually (attention vs vision). Pt Did minimally participate in face washing while sitting EOB (mod A to maintain sitting EOB) with hand over hand assist. He was able to perform sit<>stand with slight lateral movement to reposition in the bed with max to total A +2. LUE initially was not moving at all, but by the end of the session he was using it to hold on during transfer and bringing it to his face. OT will continue to follow acutely. STRONGLY recommend SNF post-acute to maximize safety and independence in ADL and functional transfers.  ?   ? ?Recommendations for follow up therapy are one component of a multi-disciplinary discharge planning process, led by the attending physician.  Recommendations may be updated based on patient status, additional functional criteria and insurance authorization.  ? ?Follow Up Recommendations ? Skilled nursing-short term rehab (<3 hours/day)  ?  ?Assistance Recommended at Discharge Frequent or constant Supervision/Assistance  ?Patient can return home with the following Two people to help with walking and/or transfers;Two people to help with  bathing/dressing/bathroom;Direct supervision/assist for medications management;Direct supervision/assist for financial management;Help with stairs or ramp for entrance;Assist for transportation ? ?  ?Functional Status Assessment ? Patient has had a recent decline in their functional status and demonstrates the ability to make significant improvements in function in a reasonable and predictable amount of time.  ?Equipment Recommendations ? BSC/3in1;Other (comment) (RW)  ?  ?Recommendations for Other Services PT consult;Speech consult ? ? ?  ?Precautions / Restrictions Precautions ?Precautions: Fall ?Restrictions ?Weight Bearing Restrictions: No  ? ?  ? ?Mobility Bed Mobility ?Overal bed mobility: Needs Assistance ?Bed Mobility: Supine to Sit, Sit to Supine ?  ?  ?Supine to sit: Total assist, +2 for physical assistance, +2 for safety/equipment ?Sit to supine: Total assist, +2 for physical assistance, +2 for safety/equipment ?  ?General bed mobility comments: patient doing <25% of movement and requires overall totalA+2 to complete ?  ? ?Transfers ?Overall transfer level: Needs assistance ?Equipment used: 2 person hand held assist ?Transfers: Sit to/from Stand ?Sit to Stand: Total assist, +2 physical assistance, +2 safety/equipment ?  ?  ?  ?  ?  ?General transfer comment: requires totalA+2 to stand from EOB with B knees blocked. No muscle activation noted by patient ?  ? ?  ?Balance Overall balance assessment: Needs assistance ?Sitting-balance support: Single extremity supported, Feet supported ?Sitting balance-Leahy Scale: Zero ?Sitting balance - Comments: requires constant min-modA to maintain sitting balance ?  ?Standing balance support: Bilateral upper extremity supported, Reliant on assistive device for balance ?Standing balance-Leahy Scale: Zero ?Standing balance comment: totalA+2 to maintain standing ?  ?  ?  ?  ?  ?  ?  ?  ?  ?  ?  ?   ? ?  ADL either performed or assessed with clinical judgement  ? ?ADL Overall  ADL's : Needs assistance/impaired ?Eating/Feeding: NPO ?  ?Grooming: Wash/dry face;Maximal assistance;Sitting ?Grooming Details (indicate cue type and reason): Pt unable to initiate task, once started does not complete - but does participate ?Upper Body Bathing: Total assistance ?  ?Lower Body Bathing: Total assistance ?  ?Upper Body Dressing : Total assistance ?  ?Lower Body Dressing: Total assistance ?  ?Toilet Transfer: Total assistance;+2 for physical assistance;+2 for safety/equipment ?  ?Toileting- Clothing Manipulation and Hygiene: Total assistance;Bed level ?  ?  ?  ?Functional mobility during ADLs: +2 for physical assistance;+2 for safety/equipment;Maximal assistance (2 person HHA) ?General ADL Comments: decreased cognition, overall total A for ADL at this time  ? ? ? ?Vision   ?Vision Assessment?: Vision impaired- to be further tested in functional context ?Additional Comments: not tracking appropriately today, decreased ROM in upper visual fields - vision vs attention/cog?!?  ?   ?Perception   ?  ?Praxis   ?  ? ?Pertinent Vitals/Pain Pain Assessment ?Pain Assessment: Faces ?Faces Pain Scale: No hurt ?Pain Intervention(s): Monitored during session, Repositioned  ? ? ? ?Hand Dominance Right ?  ?Extremity/Trunk Assessment Upper Extremity Assessment ?Upper Extremity Assessment: Generalized weakness;RUE deficits/detail;LUE deficits/detail ?RUE Deficits / Details: dominant hand, constantly trying to put behind his back, generalized wekaness ?LUE Deficits / Details: initially not moving LUE, would not follow commands to squeeze hand, but then able to functionally hold onto arm of therapist during transfer. continue to assess as performance was inconsistent. ?LUE Coordination: decreased fine motor;decreased gross motor ?  ?Lower Extremity Assessment ?Lower Extremity Assessment: Defer to PT evaluation ?  ?Cervical / Trunk Assessment ?Cervical / Trunk Assessment: Kyphotic ?  ?Communication  Communication ?Communication: Expressive difficulties ?  ?Cognition Arousal/Alertness: Awake/alert ?Behavior During Therapy: Flat affect ?Overall Cognitive Status: Difficult to assess ?Area of Impairment: Attention, Following commands, Safety/judgement, Awareness, Problem solving, Memory ?  ?  ?  ?  ?  ?  ?  ?  ?  ?Current Attention Level: Focused ?Memory: Decreased short-term memory ?Following Commands: Follows one step commands inconsistently, Follows one step commands with increased time ?Safety/Judgement: Decreased awareness of safety, Decreased awareness of deficits ?Awareness: Intellectual ?Problem Solving: Slow processing, Decreased initiation, Requires verbal cues, Difficulty sequencing, Requires tactile cues ?General Comments: Following <25% of commands. Difficult to understand and not answering all questions. Difficulty sequencing and poor initiation, slowed processing ?  ?  ?General Comments  BP 80/50 after activity ? ?  ?Exercises   ?  ?Shoulder Instructions    ? ? ?Home Living Family/patient expects to be discharged to:: Private residence ?Living Arrangements: Alone ?Available Help at Discharge: Neighbor;Available PRN/intermittently ?Type of Home: Apartment ?Home Access: Stairs to enter ?Entrance Stairs-Number of Steps: 4 ?  ?Home Layout: One level ?  ?  ?Bathroom Shower/Tub: Walk-in shower ?  ?Bathroom Toilet: Standard ?Bathroom Accessibility: Yes ?  ?Home Equipment: Rollator (4 wheels);Rolling Walker (2 wheels);Grab bars - toilet;Grab bars - tub/shower ?  ?  ?  ? ?  ?Prior Functioning/Environment Prior Level of Function : Other (comment) ?  ?  ?  ?  ?  ?  ?Mobility Comments: Unsure of PLOF as patient having difficulty answering questions ?  ?  ? ?  ?  ?OT Problem List: Decreased strength;Decreased activity tolerance;Decreased range of motion;Impaired balance (sitting and/or standing);Decreased coordination;Decreased safety awareness;Decreased knowledge of use of DME or AE;Decreased knowledge of  precautions ?  ?   ?OT Treatment/Interventions: Self-care/ADL training;Therapeutic exercise;DME  and/or AE instruction;Balance training;Patient/family education;Therapeutic activities  ?  ?OT Goals(Current goals can be found in the care plan section) Ac

## 2021-07-02 NOTE — Evaluation (Signed)
Clinical/Bedside Swallow Evaluation ?Patient Details  ?Name: Charles [name] ?MRN: [id number] ?Date of Birth: Dec 20, 1960 ? ?Today's Date: 07/02/2021 ?Time: SLP Start Time (ACUTE ONLY): [id number] SLP Stop Time (ACUTE ONLY): 0940 ?SLP Time Calculation (min) (ACUTE ONLY): 20 min ? ?Past Medical History:  ?Past Medical History:  ?Diagnosis Date  ? Arthritis   ? Diabetes mellitus without complication ([place])   ? Heart disease   ? Hypertension   ? ?Past Surgical History:  ?Past Surgical History:  ?Procedure Laterality Date  ? CORONARY ARTERY BYPASS GRAFT    ? ?HPI:  ?Patient is a 61 y.o. male with PMH: insulin-dependent DM, CAD, urinary retention with indwelling catheter who presented to the ED with lethargy and confusion. His sister had spoken with him on the phone in AM day before admission and he told her he was tired and jus wanted to sleep but on day of admission he was found by his home health therapist to be lethargic and confused. EMS was called and patient found to have BP of 76/38. Upon arrival in ED, patient's rectal temperature was 89 degrees F and systolic pressure was as low as 78. EKG features sinus bradycardia with incomplete LBBB. CXR was negative for acute cardiopulmonary disease, UA concerning for infection.  ?  ?Assessment / Plan / Recommendation  ?Clinical Impression ? Patient currently presenting with clinical s/s of dysphagia as per this bedside/clinical swallow evaluation and SLP suspects this is a reversible dysphagia caused by his current state of lethargy and significant weakness. Per RN who came into room at beginning of this evaluation, as patient is improving with current IV medications, MD plans to monitor his progress over next 24 to 48 hours to determine if non-oral nutrition would be needed. SLP is recommending continue NPO status but to allow patient small amounts of toothette sponges soaked in water PRN when patient is alert and after oral care. SLP will follow for patient readiness to trial  PO's. ?SLP Visit Diagnosis: Dysphagia, unspecified (R13.10) ?   ?Aspiration Risk ? Moderate aspiration risk;Risk for inadequate nutrition/hydration;Severe aspiration risk  ?  ?Diet Recommendation NPO;Other (Comment) (PRN toothette sponge soaked with small amount of water (after oral care and when patient alert))  ? ?Medication Administration: Via alternative means  ?  ?Other  Recommendations Oral Care Recommendations: Oral care QID;Staff/trained caregiver to provide oral care   ? ?Recommendations for follow up therapy are one component of a multi-disciplinary discharge planning process, led by the attending physician.  Recommendations may be updated based on patient status, additional functional criteria and insurance authorization. ? ?Follow up Recommendations Other (comment) (TBD)  ? ? ?  ?Assistance Recommended at Discharge Frequent or constant Supervision/Assistance  ?Functional Status Assessment Patient has had a recent decline in their functional status and demonstrates the ability to make significant improvements in function in a reasonable and predictable amount of time.  ?Frequency and Duration min 2x/week  ?1 week ?  ?   ? ?Prognosis Prognosis for Safe Diet Advancement: Good  ? ?  ? ?Swallow Study   ?General Date of Onset: 06/30/21 ?HPI: Patient is a 61 y.o. male with PMH: insulin-dependent DM, CAD, urinary retention with indwelling catheter who presented to the ED with lethargy and confusion. His sister had spoken with him on the phone in AM day before admission and he told her he was tired and jus wanted to sleep but on day of admission he was found by his home health therapist to be lethargic and confused. EMS was called and  patient found to have BP of 76/38. Upon arrival in ED, patient's rectal temperature was 89 degrees F and systolic pressure was as low as 78. EKG features sinus bradycardia with incomplete LBBB. CXR was negative for acute cardiopulmonary disease, UA concerning for infection. ?Type of  Study: Bedside Swallow Evaluation ?Previous Swallow Assessment: none found ?Diet Prior to this Study: NPO ?Temperature Spikes Noted: No ?Respiratory Status: Room air ?History of Recent Intubation: No ?Behavior/Cognition: Alert;Cooperative;Pleasant mood;Lethargic/Drowsy ?Oral Cavity Assessment: Dry;Dried secretions ?Oral Care Completed by SLP: Yes ?Oral Cavity - Dentition: Adequate natural dentition ?Self-Feeding Abilities: Total assist ?Patient Positioning: Upright in bed ?Baseline Vocal Quality: Low vocal intensity ?Volitional Cough: Cognitively unable to elicit ?Volitional Swallow: Unable to elicit  ?  ?Oral/Motor/Sensory Function Overall Oral Motor/Sensory Function: Generalized oral weakness   ?Ice Chips     ?Thin Liquid Thin Liquid: Impaired ?Presentation:  (via toothette sponge) ?Oral Phase Impairments: Reduced labial seal;Reduced lingual movement/coordination ?Pharyngeal  Phase Impairments: Suspected delayed Swallow;Cough - Delayed  ?  ?Nectar Thick Nectar Thick Liquid: Not tested   ?Honey Thick Honey Thick Liquid: Not tested   ?Puree Puree: Not tested   ?Solid ? ? ?  Solid: Not tested  ? ?  ?Sonia Baller, MA, CCC-SLP ?Speech Therapy ? ? ? ?

## 2021-07-02 NOTE — Progress Notes (Signed)
Pharmacy Antibiotic Note ? ?Charles [name] is a 61 y.o. male admitted on 06/30/2021 with sepsis and UTI.  Pharmacy has been consulted for cefepime dosing. ? ?Patient with history of indwelling catheter presented 4/20 with lethargy, confusion, hypotension, and hypothermia. UA >50k WBC and few bacteria. SCr 1.22 upon admission, now 0.91 with a CrCl of 68.5 ml/min. Will adjust cefepime dose for improved renal function.  ? ?Plan: ?Increase cefepime to 2g q8h ?Follow cultures, renal function, and clinical improvement ? ?Height: 5\' 6"  (167.6 cm) ?Weight: 54.7 kg (120 lb 9.5 oz) ?IBW/kg (Calculated) : 63.8 ? ?Temp (24hrs), Avg:97.4 ?F (36.3 ?C), Min:97 ?F (36.1 ?C), Max:97.6 ?F (36.4 ?C) ? ?Recent Labs  ?Lab 06/30/21 ?1637 06/30/21 ?2337 07/01/21 ?07/03/21 07/01/21 ?1024 07/02/21 ?0246  ?WBC 6.6  --  6.1  --  10.4  ?CREATININE 1.22 1.20 1.26* 1.24 0.91  ?LATICACIDVEN 1.6  --   --   --   --   ?  ?Estimated Creatinine Clearance: 68.5 mL/min (by C-G formula based on SCr of 0.91 mg/dL).   ? ?No Known Allergies ? ?Antimicrobials this admission: ?Metronidazole 4/20 x1 ?Vancomycin 4/20 x1 ?Cefepime 4/20 >> ? ?Dose adjustments this admission: ?4/22: Cefepime 2g q12h >> 2g q8h ? ?Microbiology results: ?4/20 BCx: ngtd ?4/20 Ucx: ngtd ?4/20 UA: >50 WBC ? ?Thank you for allowing pharmacy to be a part of this patient?s care. ? ?5/20 ?07/02/2021 8:01 AM ? ?

## 2021-07-02 NOTE — Evaluation (Signed)
Physical Therapy Evaluation ?Patient Details ?Name: Charles [name] ?MRN: [id number] ?DOB: 02/15/1961 ?Today's Date: 07/02/2021 ? ?History of Present Illness ? 61 y/o male presented to ED on 06/30/21 for weakness and confusion with HHPT. Found to be hypotensive and bradycardic with EMS. Admitted for severe sepsis and UTI. Recent admission 3/25-3/27 for hypoglycemic event and unresponsiveness - d/c'ed home despite SNF recommendation. PMH: with medical history significant of HTN, CAD status post CABG, BPH, diabetes  ?Clinical Impression ? Patient admitted with the above. Patient presents with impaired cognition, impaired balance, weakness, and decreased activity tolerance. Patient requires totalA+2 for bed mobility and sit to stand this session. Patient following <25% of commands and difficulty with initiation and sequencing. BP at end of session 80/50. Patient with expressive difficulties and unable to provide PLOF. Patient will benefit from skilled PT services during acute stay to address listed deficits. Recommend SNF at discharge to maximize functional mobility and safety.    ?   ? ?Recommendations for follow up therapy are one component of a multi-disciplinary discharge planning process, led by the attending physician.  Recommendations may be updated based on patient status, additional functional criteria and insurance authorization. ? ?Follow Up Recommendations Skilled nursing-short term rehab (<3 hours/day) ? ?  ?Assistance Recommended at Discharge Frequent or constant Supervision/Assistance  ?Patient can return home with the following ? Two people to help with walking and/or transfers;Two people to help with bathing/dressing/bathroom;Assistance with cooking/housework;Direct supervision/assist for medications management;Direct supervision/assist for financial management;Assist for transportation;Help with stairs or ramp for entrance ? ?  ?Equipment Recommendations Other (comment) (defer to next venue of care)   ?Recommendations for Other Services ?    ?  ?Functional Status Assessment Patient has had a recent decline in their functional status and/or demonstrates limited ability to make significant improvements in function in a reasonable and predictable amount of time  ? ?  ?Precautions / Restrictions Precautions ?Precautions: Fall ?Restrictions ?Weight Bearing Restrictions: No  ? ?  ? ?Mobility ? Bed Mobility ?Overal bed mobility: Needs Assistance ?Bed Mobility: Supine to Sit, Sit to Supine ?  ?  ?Supine to sit: Total assist, +2 for physical assistance, +2 for safety/equipment ?Sit to supine: Total assist, +2 for physical assistance, +2 for safety/equipment ?  ?General bed mobility comments: patient doing <25% of movement and requires overall totalA+2 to complete ?  ? ?Transfers ?Overall transfer level: Needs assistance ?Equipment used: 2 person hand held assist ?Transfers: Sit to/from Stand ?Sit to Stand: Total assist, +2 physical assistance, +2 safety/equipment ?  ?  ?  ?  ?  ?General transfer comment: requires totalA+2 to stand from EOB with B knees blocked. No muscle activation noted by patient ?  ? ?Ambulation/Gait ?  ?  ?  ?  ?  ?  ?  ?  ? ?Stairs ?  ?  ?  ?  ?  ? ?Wheelchair Mobility ?  ? ?Modified Rankin (Stroke Patients Only) ?  ? ?  ? ?Balance Overall balance assessment: Needs assistance ?Sitting-balance support: Single extremity supported, Feet supported ?Sitting balance-Leahy Scale: Zero ?Sitting balance - Comments: requires constant min-modA to maintain sitting balance ?  ?Standing balance support: Bilateral upper extremity supported, Reliant on assistive device for balance ?Standing balance-Leahy Scale: Zero ?Standing balance comment: totalA+2 to maintain standing ?  ?  ?  ?  ?  ?  ?  ?  ?  ?  ?  ?   ? ? ? ?Pertinent Vitals/Pain Pain Assessment ?Pain Assessment: Faces ?Faces Pain Scale: No  hurt ?Pain Intervention(s): Monitored during session  ? ? ?Home Living Family/patient expects to be discharged to::  Private residence ?Living Arrangements: Alone ?Available Help at Discharge: Neighbor;Available PRN/intermittently ?Type of Home: Apartment ?Home Access: Stairs to enter ?  ?Entrance Stairs-Number of Steps: 4 ?  ?Home Layout: One level ?Home Equipment: Rollator (4 wheels);Rolling [place] (2 wheels);Grab bars - toilet;Grab bars - tub/shower ?   ?  ?Prior Function Prior Level of Function : Other (comment) ?  ?  ?  ?  ?  ?  ?Mobility Comments: Unsure of PLOF as patient having difficulty answering questions ?  ?  ? ? ?Hand Dominance  ?   ? ?  ?Extremity/Trunk Assessment  ? Upper Extremity Assessment ?Upper Extremity Assessment: Defer to OT evaluation ?  ? ?Lower Extremity Assessment ?Lower Extremity Assessment: Generalized weakness;Difficult to assess due to impaired cognition (Patient not following commands for MMT) ?  ? ?Cervical / Trunk Assessment ?Cervical / Trunk Assessment: Kyphotic  ?Communication  ? Communication: Expressive difficulties  ?Cognition Arousal/Alertness: Awake/alert ?Behavior During Therapy: Flat affect ?Overall Cognitive Status: Difficult to assess ?Area of Impairment: Attention, Following commands, Safety/judgement, Awareness, Problem solving, Memory ?  ?  ?  ?  ?  ?  ?  ?  ?  ?Current Attention Level: Focused ?Memory: Decreased short-term memory ?Following Commands: Follows one step commands inconsistently ?Safety/Judgement: Decreased awareness of safety, Decreased awareness of deficits ?Awareness: Intellectual ?Problem Solving: Slow processing, Decreased initiation, Requires verbal cues, Difficulty sequencing, Requires tactile cues ?General Comments: Following <25% of commands. Difficult to understand and not answering all questions. Difficulty sequencing and poor initiation. ?  ?  ? ?  ?General Comments General comments (skin integrity, edema, etc.): BP 80/50 after activity ? ?  ?Exercises    ? ?Assessment/Plan  ?  ?PT Assessment Patient needs continued PT services  ?PT Problem List Decreased  strength;Decreased balance;Decreased mobility;Decreased cognition;Decreased coordination;Decreased knowledge of use of DME;Decreased safety awareness;Decreased knowledge of precautions ? ?   ?  ?PT Treatment Interventions DME instruction;Gait training;Stair training;Functional mobility training;Therapeutic activities;Therapeutic exercise;Balance training;Cognitive remediation;Patient/family education   ? ?PT Goals (Current goals can be found in the Care Plan section)  ?Acute Rehab PT Goals ?Patient Stated Goal: did not state ?PT Goal Formulation: Patient unable to participate in goal setting ?Time For Goal Achievement: 07/16/21 ?Potential to Achieve Goals: Fair ? ?  ?Frequency Min 2X/week ?  ? ? ?Co-evaluation PT/OT/SLP Co-Evaluation/Treatment: Yes ?Reason for Co-Treatment: Necessary to address cognition/behavior during functional activity;For patient/therapist safety;To address functional/ADL transfers ?PT goals addressed during session: Mobility/safety with mobility;Balance ?  ?  ? ? ?  ?AM-PAC PT "6 Clicks" Mobility  ?Outcome Measure Help needed turning from your back to your side while in a flat bed without using bedrails?: Total ?Help needed moving from lying on your back to sitting on the side of a flat bed without using bedrails?: Total ?Help needed moving to and from a bed to a chair (including a wheelchair)?: Total ?Help needed standing up from a chair using your arms (e.g., wheelchair or bedside chair)?: Total ?Help needed to walk in hospital room?: Total ?Help needed climbing 3-5 steps with a railing? : Total ?6 Click Score: 6 ? ?  ?End of Session Equipment Utilized During Treatment: Gait belt ?Activity Tolerance: Patient limited by lethargy ?Patient left: in bed;with call bell/phone within reach;with bed alarm set ?Nurse Communication: Mobility status;Other (comment) (BP) ?PT Visit Diagnosis: Unsteadiness on feet (R26.81);Other abnormalities of gait and mobility (R26.89);Muscle weakness (generalized)  (M62.81);History of falling (  Z91.81);Difficulty in walking, not elsewhere classified (R26.2);Other symptoms and signs involving the nervous system (R29.898);Adult, failure to thrive (R62.7) ?  ? ?Time: [id number] ?P

## 2021-07-02 NOTE — Progress Notes (Signed)
?      ?                 PROGRESS NOTE ? ?      ?PATIENT DETAILS ?Name: Charles [name] ?Age: 61 y.o. ?Sex: male ?Date of Birth: 1960/08/28 ?Admit Date: 06/30/2021 ?Admitting Physician Vianne Bulls, MD ?[id number], [name], MD ? ?Brief Summary: ?Patient is a 61 y.o. [place] male with a history of chronic indwelling Foley catheter, DM-2, CAD s/p CABG, HLD-who presented to the hospital with acute metabolic encephalopathy due to sepsis from complicated UTI.  See below for further details. ? ?Significant events: ?3/25-3/27>> hospitalization for hypoglycemia/encephalopathy-refused SNF. ?4/20>> found at home by home health-lethargic/confused-hypotensive-hypothermic.  Admit to [facility]. ? ?Significant studies: ?4/20>> CXR: No PNA ?4/20>> renal ultrasound: Trace right perinephric fluid, echogenic debris in the distended bladder. ? ?Significant microbiology data: ?4/20>> COVID/influenza PCR: Negative ?4/20>> blood culture: No growth ?4/20>> urine culture: Pending ? ?Procedures: ?None ? ?Consults: ?None  ? ?Subjective:  Patient in bed, appears very dehydrated, denies any headache, no chest or abdominal pain. ? ? ?Objective: ?Vitals: ?Blood pressure (!) 77/54, pulse 71, temperature (!) 96 ?F (35.6 ?C), temperature source Axillary, resp. rate 18, height 5\' 6"  (1.676 m), weight 54.7 kg, SpO2 97 %.  ? ?Exam: ? ?Awake but appears extremely weak and dehydrated, no neck stiffness, negative Kernig's and Brudzinski's ?[place].AT,PERRAL ?Supple Neck, No JVD,   ?Symmetrical Chest wall movement, Good air movement bilaterally, CTAB ?RRR,No Gallops, Rubs or new Murmurs,  ?+ve B.Sounds, Abd Soft, No tenderness,   ?No Cyanosis, Clubbing or edema  ? ? ? ?Assessment/Plan: ? ?Sepsis due to complicated UTI: In a patient with indwelling Foley catheter who missed his urology outpatient appointment, urine appears clear, Foley being flushed, continue empiric IV antibiotics along with aggressive hydration.  Sepsis pathophysiology seems to have considerably  improved.  Will monitor closely. ? ?Acute metabolic encephalopathy: Due to sepsis-severe dehydration.  Will obtain head CT to rule out any large intracranial abnormality, with supportive care he is clinically improving, continue aggressive hydration and treatment for sepsis.  No focal deficits.  No headache, negative Kernig's and Brezinski sign.  PT, OT and speech to monitor as well. ? ?Anion gap metabolic acidosis: Suspect due to combination of sepsis/hypotension/mild renal insufficiency/starvation ketoacidosis/metformin.  Resolved after IV bicarb and supportive care will monitor ? ?Hypokalemia/hypomagnesemia/hypophosphatemia: Aggressively replaced will monitor. ? ?CAD s/p CABG: No obvious anginal symptoms. ? ?HLD: Hold statin until encephalopathy improves. ? 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 ? ?Insulin-dependent DM-2 (A1c 10.16 April 2021): CBG stable-currently stable on just SSI.  Metformin/Lantus on hold-until he is more awake alert and able to continue oral intake. ? ?Recent Labs  ?  07/01/21 ?2337 07/02/21 ?[id number] 07/02/21 ?0731  ?GLUCAP 91 91 95  ?  ? ? ?BMI: ?Estimated body mass index is 19.46 kg/m? as calculated from the following: ?  Height as of this encounter: 5\' 6"  (1.676 m). ?  Weight as of this encounter: 54.7 kg.  ? ?Code status: ?  Code Status: Full Code  ? ?DVT Prophylaxis: ?heparin injection 5,000 Units Start: 06/30/21 2230 ?  ?Family Communication: Sister [name]-updated over the phone on 4/21 (she lives in [place]) ? ? ?Disposition Plan: ?Status is: Inpatient ?Remains inpatient appropriate because: Sepsis from complicated UTI-encephalopathy-still very confused-sepsis physiology not yet resolved.  Not stable for discharge. ?  ?Planned Discharge Destination:Skilled nursing facility ? ? ?Diet: ?Diet Order   ? ?       ?  Diet NPO time specified Except for: Ice Chips   Diet effective now       ?  ? ?  ?  ? ?  ?  ? ? ?Antimicrobial agents: ?Anti-infectives (From admission, onward)  ? ? Start     Dose/Rate Route Frequency Ordered Stop  ? 07/02/21 1400  ceFEPIme (MAXIPIME) 2 g in sodium chloride 0.9 % 100 mL IVPB       ? 2 g ?200 mL/hr over 30 Minutes Intravenous Every 8 hours 07/02/21 0759    ? 07/01/21 1700  vancomycin (VANCOCIN) IVPB 1000 mg/200 mL premix  Status:  Discontinued       ? 1,000 mg ?200 mL/hr over 60 Minutes Intravenous Every 24 hours 06/30/21 1813 06/30/21 2226  ? 07/01/21 0600  ceFEPIme (MAXIPIME) 2 g in sodium chloride 0.9 % 100 mL IVPB  Status:  Discontinued       ? 2 g ?200 mL/hr over 30 Minutes Intravenous Every 12 hours 06/30/21 1813 07/02/21 0759  ? 06/30/21 1645  metroNIDAZOLE (FLAGYL) IVPB 500 mg       ? 500 mg ?100 mL/hr over 60 Minutes Intravenous  Once 06/30/21 1638 06/30/21 1818  ? 06/30/21 1645  ceFEPIme (MAXIPIME) 2 g in sodium chloride 0.9 % 100 mL IVPB       ? 2 g ?200 mL/hr over 30 Minutes Intravenous  Once 06/30/21 1643 06/30/21 1736  ? 06/30/21 1645  vancomycin (VANCOREADY) IVPB 1250 mg/250 mL       ? 1,250 mg ?166.7 mL/hr over 90 Minutes Intravenous  Once 06/30/21 1643 06/30/21 1926  ? ?  ? ? ? ?MEDICATIONS: ?Scheduled Meds: ? Chlorhexidine Gluconate Cloth  6 each Topical Daily  ? heparin  5,000 Units Subcutaneous Q8H  ? insulin aspart  0-9 Units Subcutaneous Q4H  ? naphazoline-glycerin  2 drop Both Eyes TID  ? sodium chloride flush  3 mL Intravenous Q12H  ? ?Continuous Infusions: ? ceFEPime (MAXIPIME) IV    ? lactated ringers with kcl    ? magnesium sulfate bolus IVPB    ? ?PRN Meds:.acetaminophen **OR** acetaminophen ? ? ?I have personally reviewed following labs and imaging studies ? ?LABORATORY DATA: ? ?Recent Labs  ?Lab 06/30/21 ?1637 07/01/21 ?0213 07/02/21 ?0246  ?WBC 6.6 6.1 10.4  ?HGB 12.6* 11.2* 11.2*  ?HCT 34.7* 30.5* 29.9*  ?PLT 179 164 124*  ?MCV 83.8 81.8 82.6  ?MCH 30.4 30.0 30.9  ?MCHC 36.3* 36.7* 37.5*  ?RDW 15.1 15.0 15.5   ?LYMPHSABS 0.6*  --   --   ?MONOABS 0.8  --   --   ?EOSABS 0.0  --   --   ?BASOSABS 0.0  --   --   ? ? ?Recent Labs  ?Lab 06/30/21 ?1637 06/30/21 ?1722 06/30/21 ?2337 07/01/21 ?[id number] 07/01/21 ?1024 07/02/21 ?0246  ?NA 139  --  140 139 140 138  ?K 3.4*  --  3.3* 3.3* 3.5 2.7*  ?CL 106  --  110 110 108 100  ?CO2 13*  --  11* 11* 14* 24  ?GLUCOSE 273*  --  198* 133* 141* 94  ?BUN 28*  --  24* 21* 20 11  ?CREATININE 1.22  --  1.20 1.26* 1.24 0.91  ?CALCIUM 8.9  --  8.4* 8.4* 8.1* 7.4*  ?AST 16  --   --  17  --  27  ?ALT 17  --   --  17  --  20  ?ALKPHOS 121  --   --  100  --  96  ?BILITOT 1.0  --   --  1.8*  --  1.0  ?ALBUMIN 2.9*  --   --  2.3*  --  2.1*  ?MG  --   --   --  1.1* 2.2 1.5*  ?PROCALCITON  --   --   --   --   --  <0.10  ?LATICACIDVEN 1.6  --   --   --   --   --   ?INR  --  1.1  --   --   --   --   ? ? ?  ?  ? ?MICROBIOLOGY: ?Recent Results (from the past 240 hour(s))  ?Urine Culture     Status: Abnormal  ? Collection Time: 06/30/21  4:37 PM  ? Specimen: In/Out Cath Urine  ?Result Value Ref Range Status  ? Specimen Description IN/OUT CATH URINE  Final  ? Special Requests   Final  ?  NONE ?Performed at [facility], [place] [address], [place], [place] [address] ?  ? Culture MULTIPLE SPECIES PRESENT, SUGGEST RECOLLECTION (A)  Final  ? Report Status 07/02/2021 FINAL  Final  ?Blood Culture (routine x 2)     Status: None (Preliminary result)  ? Collection Time: 06/30/21  4:45 PM  ? Specimen: BLOOD  ?Result Value Ref Range Status  ? Specimen Description BLOOD RIGHT ANTECUBITAL  Final  ? Special Requests   Final  ?  BOTTLES DRAWN AEROBIC AND ANAEROBIC Blood Culture results may not be optimal due to an inadequate volume of blood received in culture bottles  ? Culture   Final  ?  NO GROWTH 2 DAYS ?Performed at [facility], [place] [address], [place], [place] [address] ?  ? Report Status PENDING  Incomplete  ?Blood Culture (routine x 2)     Status: None (Preliminary result)  ? Collection Time: 06/30/21  4:53  PM  ? Specimen: BLOOD  ?Result Value Ref Range Status  ? Specimen Description BLOOD BLOOD RIGHT WRIST  Final  ? Special Requests   Final  ?  BOTTLES DRAWN AEROBIC AND ANAEROBIC Blood Culture results may not

## 2021-07-03 ENCOUNTER — Inpatient Hospital Stay (HOSPITAL_COMMUNITY): Payer: Commercial Managed Care - HMO

## 2021-07-03 DIAGNOSIS — G9340 Encephalopathy, unspecified: Secondary | ICD-10-CM | POA: Diagnosis not present

## 2021-07-03 DIAGNOSIS — N390 Urinary tract infection, site not specified: Secondary | ICD-10-CM | POA: Diagnosis not present

## 2021-07-03 DIAGNOSIS — A419 Sepsis, unspecified organism: Secondary | ICD-10-CM | POA: Diagnosis not present

## 2021-07-03 LAB — COMPREHENSIVE METABOLIC PANEL
ALT: 19 U/L (ref 0–44)
AST: 24 U/L (ref 15–41)
Albumin: 1.9 g/dL — ABNORMAL LOW (ref 3.5–5.0)
Alkaline Phosphatase: 90 U/L (ref 38–126)
Anion gap: 9 (ref 5–15)
BUN: 7 mg/dL (ref 6–20)
CO2: 25 mmol/L (ref 22–32)
Calcium: 7.9 mg/dL — ABNORMAL LOW (ref 8.9–10.3)
Chloride: 103 mmol/L (ref 98–111)
Creatinine, Ser: 0.79 mg/dL (ref 0.61–1.24)
GFR, Estimated: >60 mL/min (ref >60)
Glucose, Bld: 107 mg/dL — ABNORMAL HIGH (ref 70–99)
Potassium: 3.8 mmol/L (ref 3.5–5.1)
Sodium: 137 mmol/L (ref 135–145)
Total Bilirubin: 0.9 mg/dL (ref 0.3–1.2)
Total Protein: 4.6 g/dL — ABNORMAL LOW (ref 6.5–8.1)

## 2021-07-03 LAB — FOLATE: Folate: 6.2 ng/mL (ref >5.9)

## 2021-07-03 LAB — RAPID HIV SCREEN (HIV 1/2 AB+AG)
HIV 1/2 Antibodies: NONREACTIVE
HIV-1 P24 Antigen - HIV24: NONREACTIVE

## 2021-07-03 LAB — CBC WITH DIFFERENTIAL/PLATELET
Abs Immature Granulocytes: 0.06 10*3/uL (ref 0.00–0.07)
Basophils Absolute: 0 10*3/uL (ref 0.0–0.1)
Basophils Relative: 0 %
Eosinophils Absolute: 0 10*3/uL (ref 0.0–0.5)
Eosinophils Relative: 0 %
HCT: 31.9 % — ABNORMAL LOW (ref 39.0–52.0)
Hemoglobin: 11.2 g/dL — ABNORMAL LOW (ref 13.0–17.0)
Immature Granulocytes: 1 %
Lymphocytes Relative: 7 %
Lymphs Abs: 0.8 10*3/uL (ref 0.7–4.0)
MCH: 29.9 pg (ref 26.0–34.0)
MCHC: 35.1 g/dL (ref 30.0–36.0)
MCV: 85.1 fL (ref 80.0–100.0)
Monocytes Absolute: 1.1 10*3/uL — ABNORMAL HIGH (ref 0.1–1.0)
Monocytes Relative: 11 %
Neutro Abs: 8.4 10*3/uL — ABNORMAL HIGH (ref 1.7–7.7)
Neutrophils Relative %: 81 %
Platelets: 106 10*3/uL — ABNORMAL LOW (ref 150–400)
RBC: 3.75 MIL/uL — ABNORMAL LOW (ref 4.22–5.81)
RDW: 15.6 % — ABNORMAL HIGH (ref 11.5–15.5)
WBC: 10.4 10*3/uL (ref 4.0–10.5)
nRBC: 0 % (ref 0.0–0.2)

## 2021-07-03 LAB — PROTIME-INR
INR: 1 (ref 0.8–1.2)
Prothrombin Time: 12.7 seconds (ref 11.4–15.2)

## 2021-07-03 LAB — PROCALCITONIN: Procalcitonin: <0.1 ng/mL

## 2021-07-03 LAB — GLUCOSE, CAPILLARY
Glucose-Capillary: 107 mg/dL — ABNORMAL HIGH (ref 70–99)
Glucose-Capillary: 108 mg/dL — ABNORMAL HIGH (ref 70–99)
Glucose-Capillary: 128 mg/dL — ABNORMAL HIGH (ref 70–99)
Glucose-Capillary: 165 mg/dL — ABNORMAL HIGH (ref 70–99)
Glucose-Capillary: 206 mg/dL — ABNORMAL HIGH (ref 70–99)
Glucose-Capillary: 305 mg/dL — ABNORMAL HIGH (ref 70–99)

## 2021-07-03 LAB — LIPID PANEL
Cholesterol: 138 mg/dL (ref 0–200)
HDL: 34 mg/dL — ABNORMAL LOW (ref >40)
LDL Cholesterol: 85 mg/dL (ref 0–99)
Total CHOL/HDL Ratio: 4.1 RATIO
Triglycerides: 95 mg/dL (ref <150)
VLDL: 19 mg/dL (ref 0–40)

## 2021-07-03 LAB — MAGNESIUM: Magnesium: 1.8 mg/dL (ref 1.7–2.4)

## 2021-07-03 LAB — HEMOGLOBIN A1C
Hgb A1c MFr Bld: 10.4 % — ABNORMAL HIGH (ref 4.8–5.6)
Mean Plasma Glucose: 251.78 mg/dL

## 2021-07-03 LAB — C-REACTIVE PROTEIN: CRP: 16.9 mg/dL — ABNORMAL HIGH (ref <1.0)

## 2021-07-03 LAB — BRAIN NATRIURETIC PEPTIDE: B Natriuretic Peptide: 152.5 pg/mL — ABNORMAL HIGH (ref 0.0–100.0)

## 2021-07-03 LAB — VITAMIN B12: Vitamin B-12: 1083 pg/mL — ABNORMAL HIGH (ref 180–914)

## 2021-07-03 LAB — TSH: TSH: 3.562 u[IU]/mL (ref 0.350–4.500)

## 2021-07-03 IMAGING — CT CT CHEST-ABD-PELV W/ CM
2 of 5 series · 13 of 36 positions shown, 15 images · IV contrast (Omni 300)
Comparison: No priors.

CLINICAL DATA: 60-year-old male with sepsis.

EXAM:
CT CHEST, ABDOMEN, AND PELVIS WITH CONTRAST
TECHNIQUE: Multidetector CT imaging of the chest, abdomen and pelvis was
performed following the standard protocol during bolus
administration of intravenous contrast.

[Series 3: cap with 5mm st · axial · 0.74mm/px · z∈[+931,+1441]mm · 10 of 126 slices shown, 12 images]
[im 12/126  mediastinal]
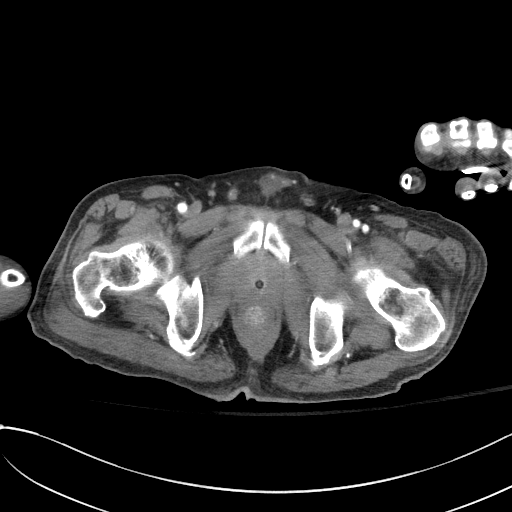
[im 12/126  bone]
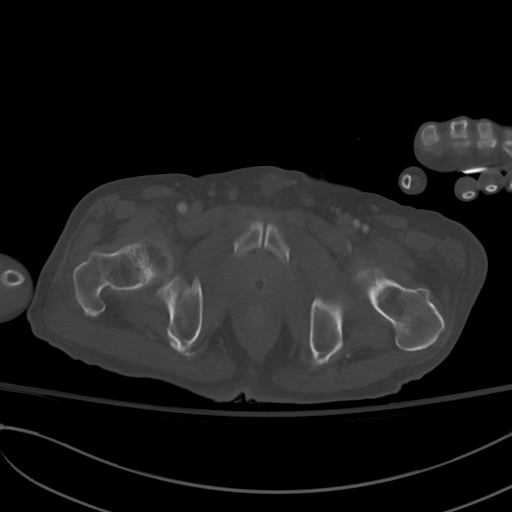
[im 23/126  mediastinal]
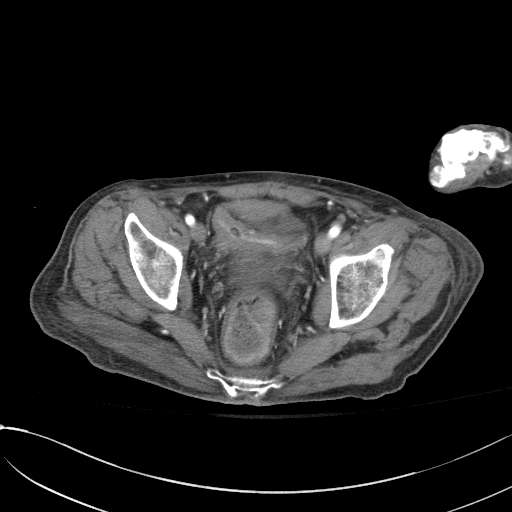
[im 35/126  mediastinal]
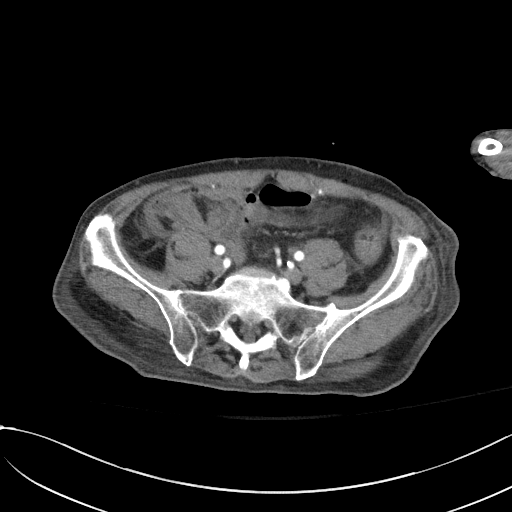
[im 46/126  mediastinal]
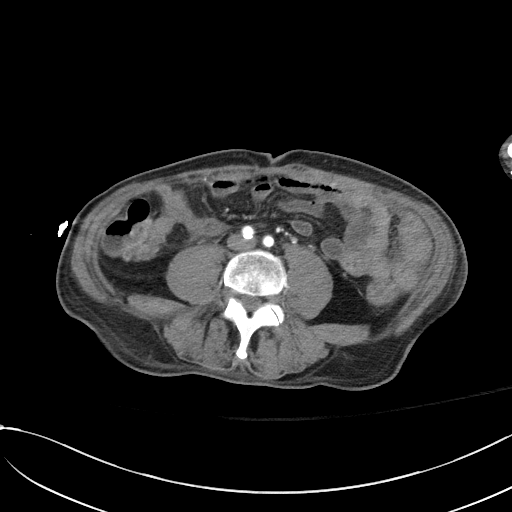
[im 57/126  mediastinal]
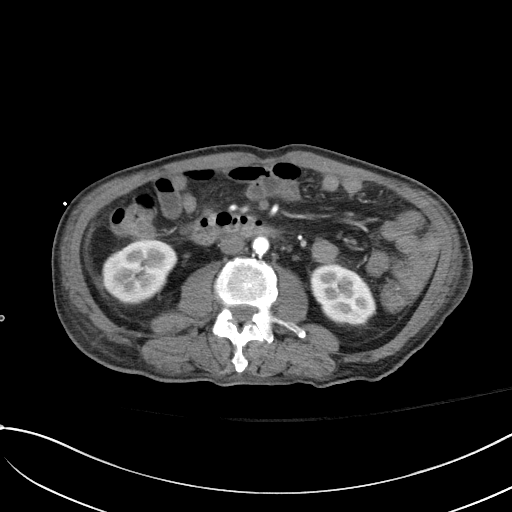
[im 69/126  mediastinal]
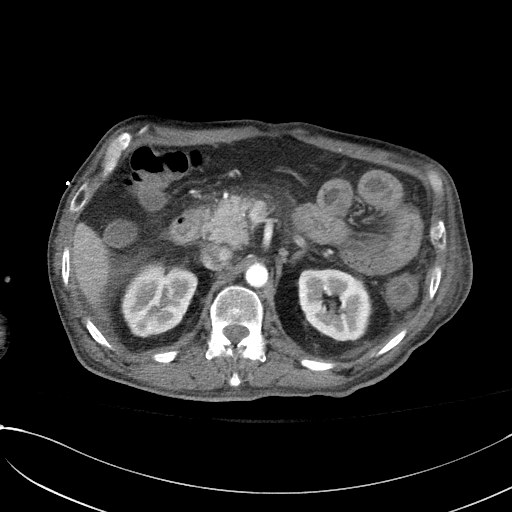
[im 80/126  mediastinal]
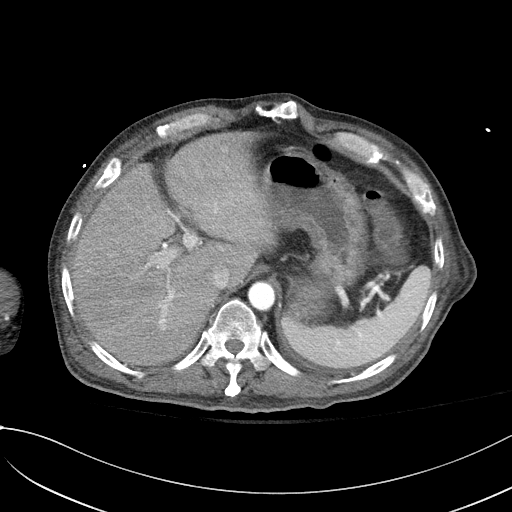
[im 91/126  mediastinal]
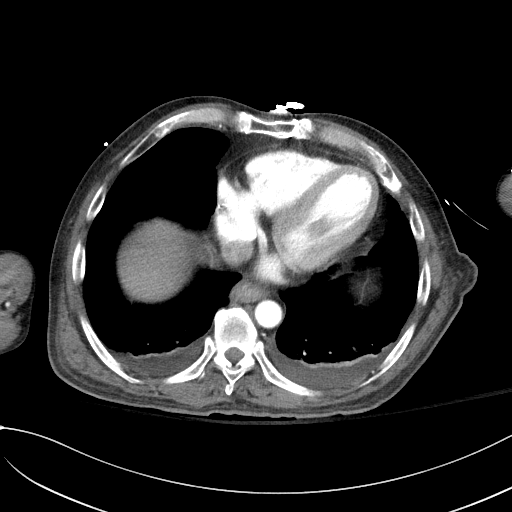
[im 103/126  mediastinal]
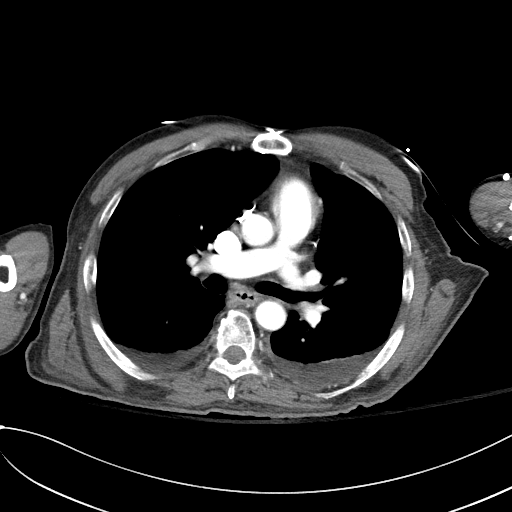
[im 103/126  bone]
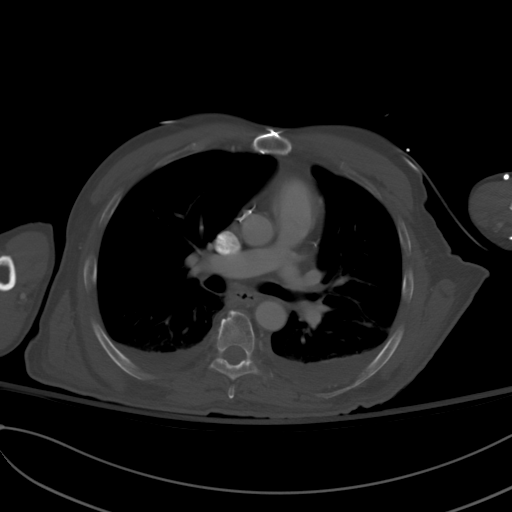
[im 114/126  mediastinal]
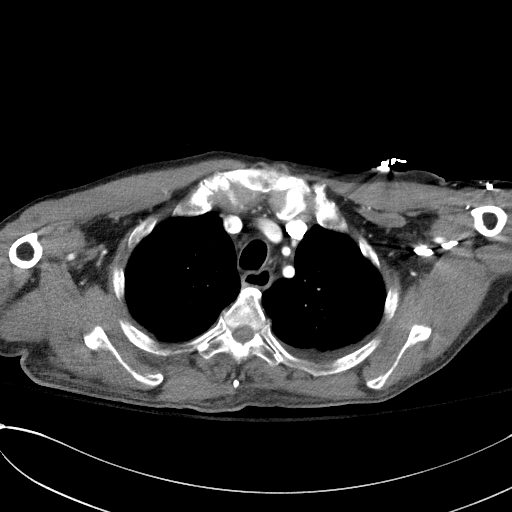

[Series 6: cap with 3mm st cor · coronal · 0.70mm/px · 3 of 151 slices shown]
[im 31/151  mediastinal]
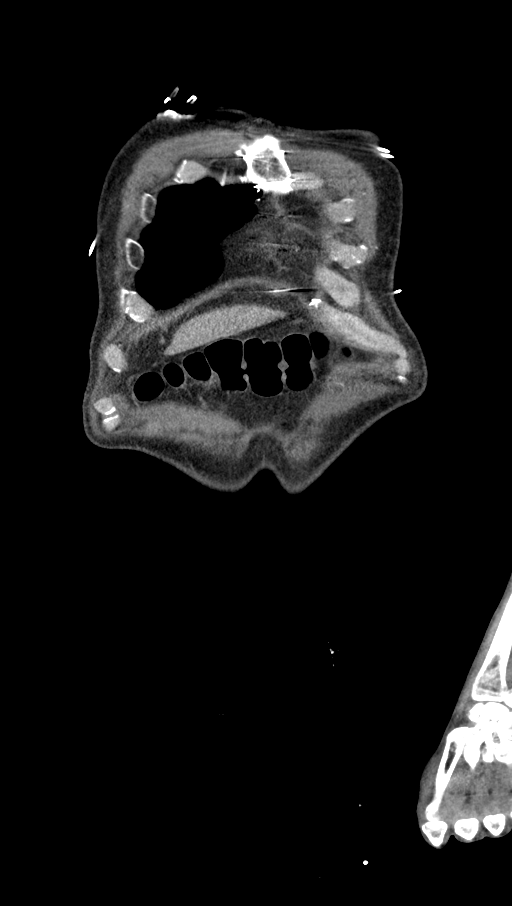
[im 61/151  mediastinal]
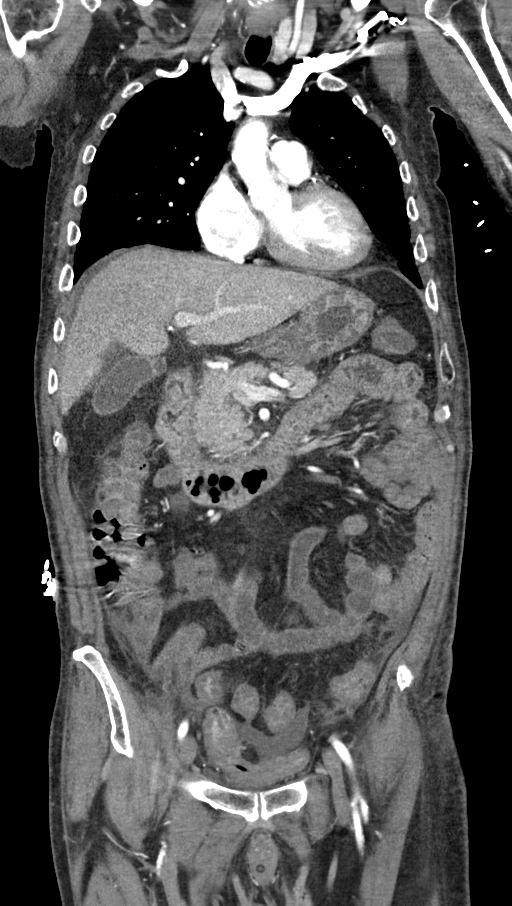
[im 91/151  mediastinal]
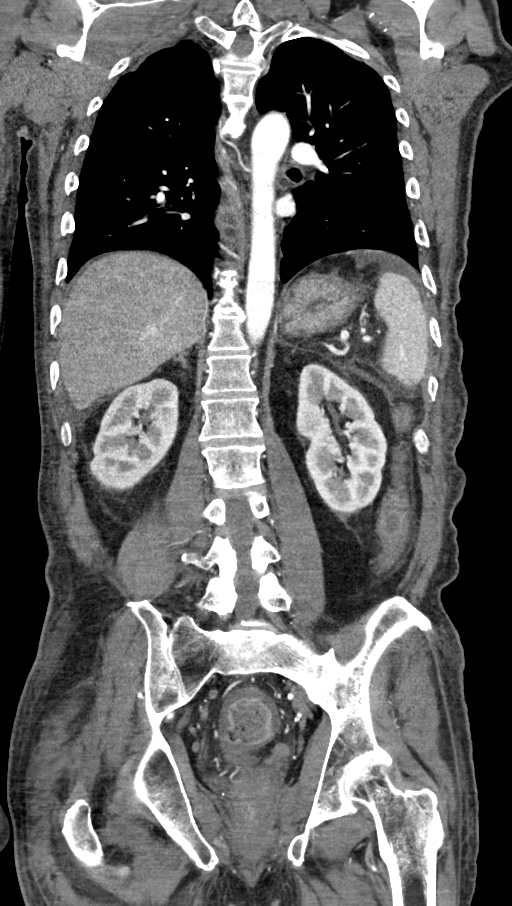

[13 of 36 positions shown; findings below may reference images not displayed]

RADIATION DOSE REDUCTION: This exam was performed according to the
departmental dose-optimization program which includes automated
exposure control, adjustment of the mA and/or kV according to
patient size and/or use of iterative reconstruction technique.

CONTRAST:  100mL OMNIPAQUE IOHEXOL 300 MG/ML  SOLN
FINDINGS: CT CHEST FINDINGS

Cardiovascular: Heart size is normal. There is no significant
pericardial fluid, thickening or pericardial calcification. There is
aortic atherosclerosis, as well as atherosclerosis of the great
vessels of the mediastinum and the coronary arteries, including
calcified atherosclerotic plaque in the left main, left anterior
descending, left circumflex and right coronary arteries. Status post
median sternotomy for CABG including [REDACTED] to the LAD.

Mediastinum/Nodes: No pathologically enlarged mediastinal or hilar
lymph nodes. Esophagus is unremarkable in appearance. No axillary
lymphadenopathy.

Lungs/Pleura: Small bilateral pleural effusions lying dependently.
Some associated passive subsegmental atelectasis in the lower lobes
of the lungs bilaterally. No acute consolidative airspace disease.
No definite suspicious appearing pulmonary nodules or masses are
noted.

Musculoskeletal: Median sternotomy wires. There are no aggressive
appearing lytic or blastic lesions noted in the visualized portions
of the skeleton.

CT ABDOMEN PELVIS FINDINGS

Hepatobiliary: Diffuse low attenuation throughout the hepatic
parenchyma, indicative of a background of hepatic steatosis. No
suspicious cystic or solid hepatic lesions. No intra or extrahepatic
biliary ductal dilatation. Gallbladder is unremarkable in
appearance.

Pancreas: No pancreatic mass. No pancreatic ductal dilatation. No
peripancreatic fluid collections.

Spleen: Unremarkable.

Adrenals/Urinary Tract: Bilateral kidneys and bilateral adrenal
glands are normal in appearance. No hydroureteronephrosis. Urinary
bladder is nearly completely decompressed around an indwelling Foley
balloon catheter.

Stomach/Bowel: The appearance of the stomach is unremarkable. No
pathologic dilatation of small bowel or colon. Appendix is not
confidently identified may be surgically absent.

Vascular/Lymphatic: Aortic atherosclerosis, without evidence of
aneurysm or dissection in the abdominal or pelvic vasculature. No
lymphadenopathy noted in the abdomen or pelvis.

Reproductive: Prostate gland and seminal vesicles are unremarkable
in appearance.

Other: Small volume of ascites. Mild diffuse mesenteric edema. No
pneumoperitoneum.

Musculoskeletal: There are no aggressive appearing lytic or blastic
lesions noted in the visualized portions of the skeleton.
IMPRESSION: 1. Severe hepatic steatosis.
2. Small volume of ascites and diffuse mesenteric edema.
3. Small bilateral pleural effusions lying dependently with some
passive subsegmental atelectasis in the lower lobes of the lungs
bilaterally.
4. Aortic atherosclerosis, in addition to left main and three-vessel
coronary artery disease. Status post median sternotomy for CABG
including [REDACTED] to the LAD.

## 2021-07-03 IMAGING — MR MR HEAD W/O CM
13 series · 48 of 48 positions shown · non-contrast
Comparison: Prior head CT examinations [DATE] and earlier.
Brain MRI [DATE].

CLINICAL DATA: Provided history: Mental status change, unknown
cause. Additional history provided: Hypotensive, bradycardic. Severe
sepsis. UTI.

EXAM:
MRI HEAD WITHOUT CONTRAST
TECHNIQUE: Multiplanar, multiecho pulse sequences of the brain and surrounding
structures were obtained without intravenous contrast.

[Series 9: DWI · axial · 3.0mm · 0.88mm/px · z∈[-112,+34]mm · 9 of 100 slices shown (1 of 4)]
[im 1/100]
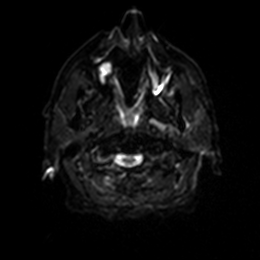
[im 13/100]
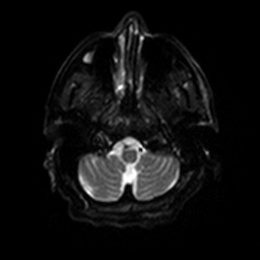
[im 25/100]
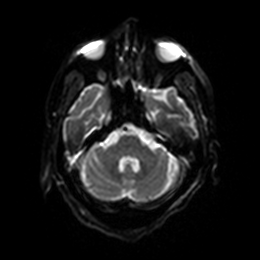
[im 38/100]
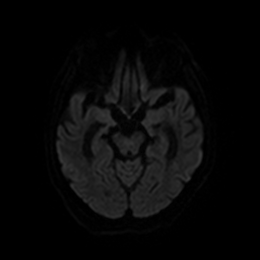
[im 50/100]
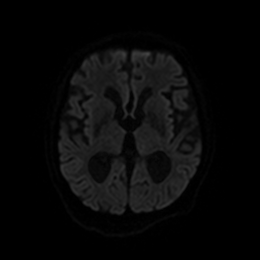
[im 62/100]
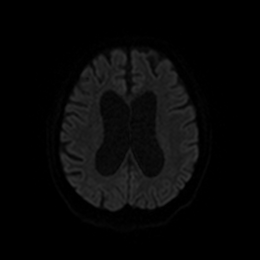
[im 75/100]
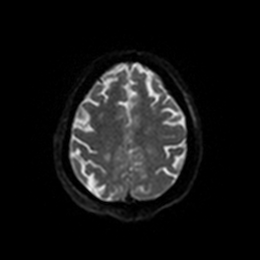
[im 87/100]
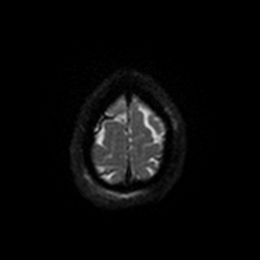
[im 100/100]
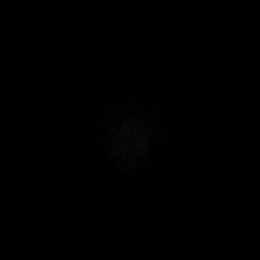

[Series 10: DWI · axial · 3.0mm · 0.88mm/px · z∈[-112,+34]mm · 4 of 50 slices shown (2 of 4)]
[im 1/50]
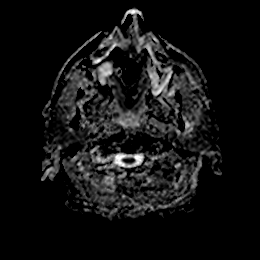
[im 17/50]
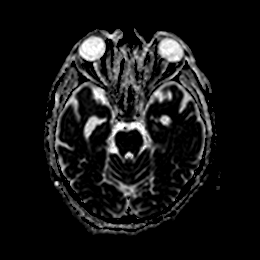
[im 33/50]
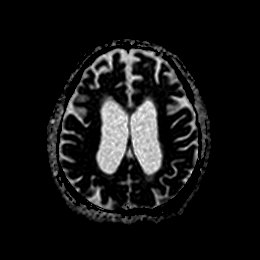
[im 50/50]
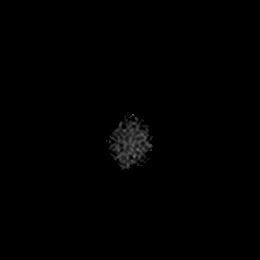

[Series 11: DWI · coronal · 4.0mm · 0.88mm/px · 6 of 70 slices shown (3 of 4)]
[im 1/70]
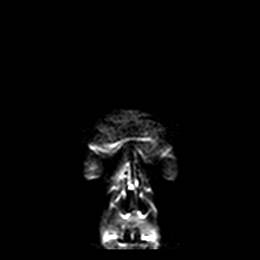
[im 14/70]
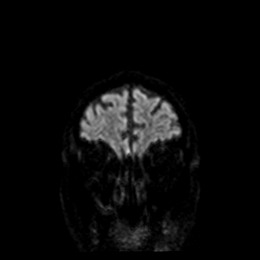
[im 28/70]
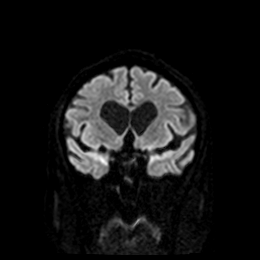
[im 42/70]
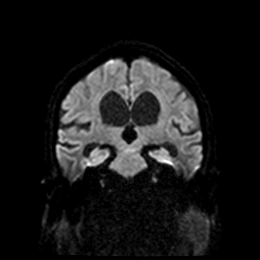
[im 56/70]
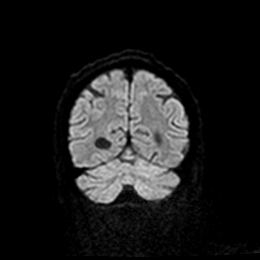
[im 70/70]
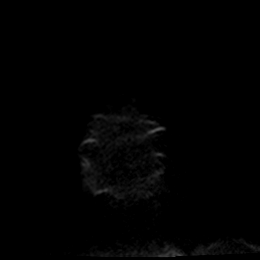

[Series 12: DWI · coronal · 4.0mm · 0.88mm/px · 3 of 35 slices shown (4 of 4)]
[im 1/35]
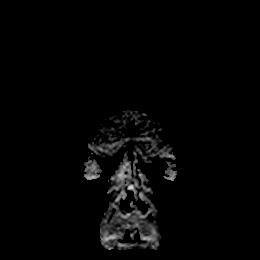
[im 18/35]
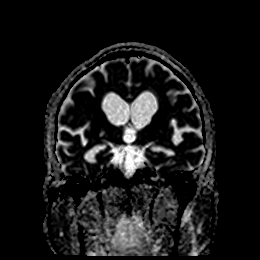
[im 35/35]
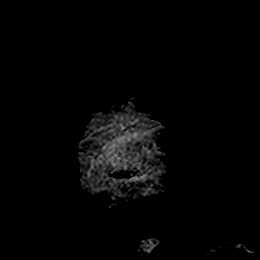

[Series 13: T1 · sagittal · 5.0mm · 0.75mm/px · 2 of 23 slices shown]
[im 1/23]
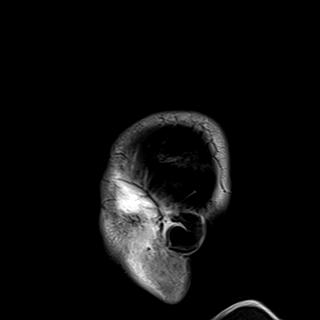
[im 23/23]
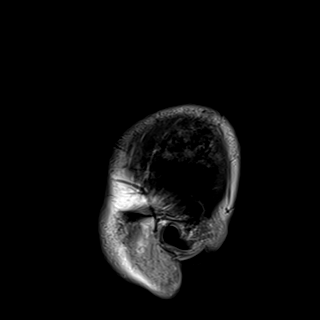

[Series 14: T2 · axial · 5.0mm · 0.72mm/px · z∈[-113,+35]mm · 2 of 26 slices shown (1 of 2)]
[im 1/26]
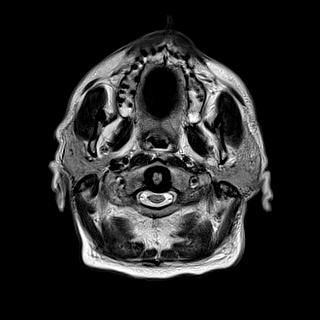
[im 26/26]
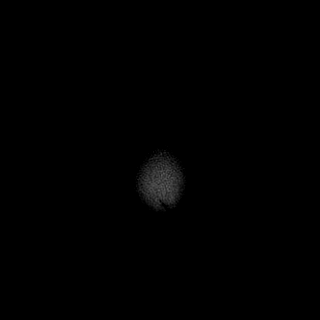

[Series 15: FLAIR · axial · 5.0mm · 0.45mm/px · z∈[-115,+34]mm · 2 of 26 slices shown (1 of 2)]
[im 1/26]
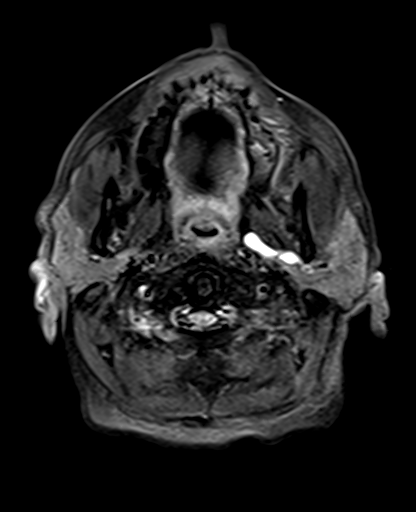
[im 26/26]
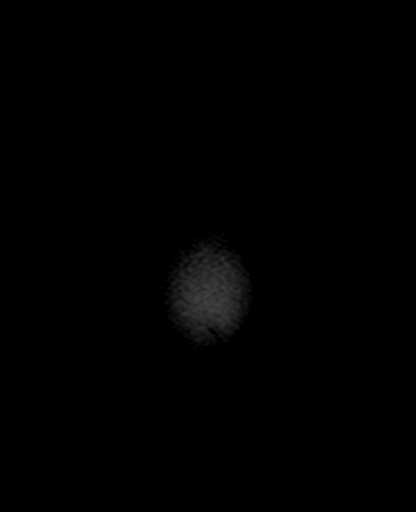

[Series 16: mag_images · axial · 3.0mm · 0.90mm/px · z∈[-117,+35]mm · 4 of 52 slices shown]
[im 1/52]
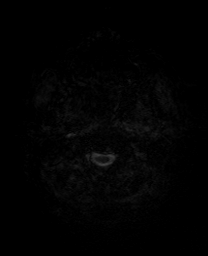
[im 18/52]
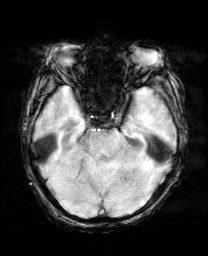
[im 35/52]
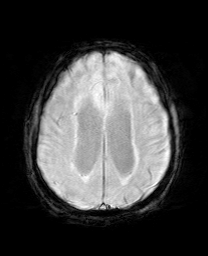
[im 52/52]
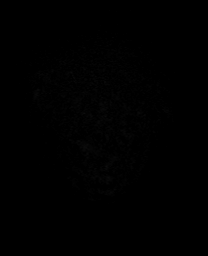

[Series 17: pha_images · axial · 3.0mm · 0.90mm/px · z∈[-117,+32]mm · 4 of 50 slices shown]
[im 1/50]
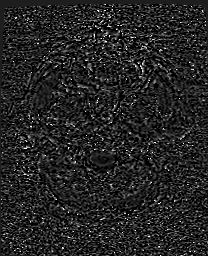
[im 17/50]
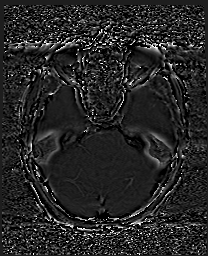
[im 33/50]
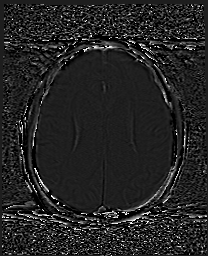
[im 50/50]
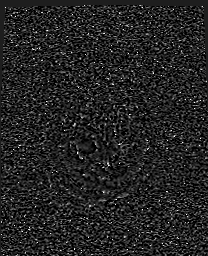

[Series 18: swi_images · axial · 3.0mm · 0.90mm/px · z∈[-117,+35]mm · 4 of 52 slices shown]
[im 1/52]
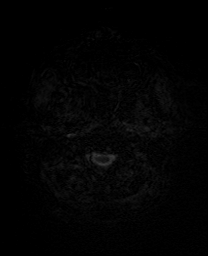
[im 18/52]
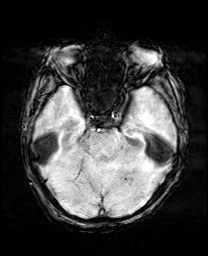
[im 35/52]
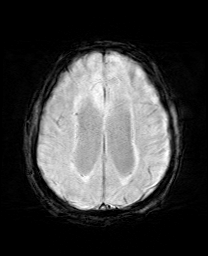
[im 52/52]
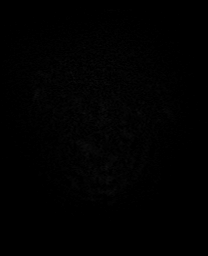

[Series 19: mip_images(sw) · axial · 24.0mm · 0.90mm/px · z∈[-106,+25]mm · 4 of 45 slices shown]
[im 1/45]
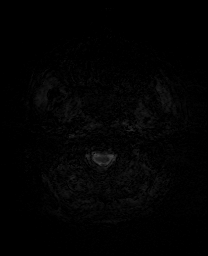
[im 15/45]
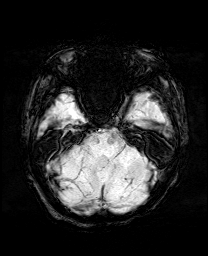
[im 30/45]
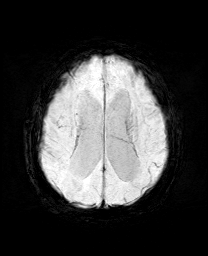
[im 45/45]
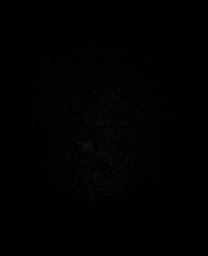

[Series 21: T2 · coronal · 5.0mm · 0.72mm/px · 2 of 28 slices shown (2 of 2)]
[im 1/28]
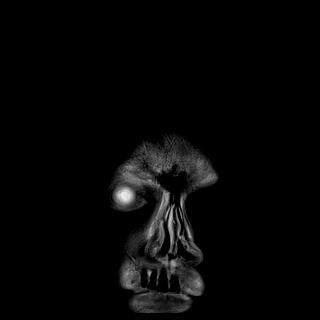
[im 28/28]
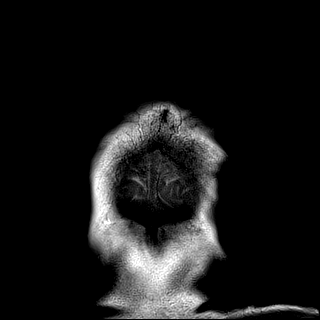

[Series 22: FLAIR · axial · 5.0mm · 0.90mm/px · z∈[-113,+35]mm · 2 of 26 slices shown (2 of 2)]
[im 1/26]
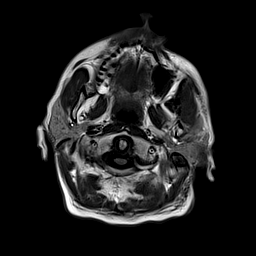
[im 26/26]
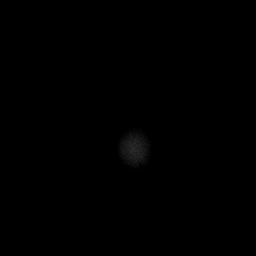

[48 of 48 positions shown; findings below may reference images not displayed]

FINDINGS: Mild intermittent motion degradation.

Brain:

Age advanced parenchymal atrophy with ventriculomegaly, unchanged
from the head CT performed yesterday. As before, ventriculomegaly is
present both proximal and beyond a 5 mm third ventricular colloid
cyst.

Mild periventricular T2 FLAIR hyperintense signal abnormality,
nonspecific but likely reflecting chronic small vessel ischemic
disease.

Redemonstrated chronic lacunar infarct within the right lentiform
nucleus.

There is no acute infarct.

No evidence of an intracranial mass.

No extra-axial fluid collection.

No midline shift.

Vascular: Maintained flow voids within the proximal large arterial
vessels.

Skull and upper cervical spine: No focal suspicious marrow lesion.

Sinuses/Orbits: Visualized orbits show no acute finding. Prior
bilateral ocular lens replacement. Moderate mucosal thickening
within the right maxillary sinus. Mild mucosal thickening within the
left maxillary sinus. Mild mucosal thickening within the bilateral
ethmoid and sphenoid sinuses.

Other: Small-volume fluid within the left mastoid air cells.
IMPRESSION: No significant interval change.

Age-advanced parenchymal atrophy with ventriculomegaly.

5 mm third ventricular colloid cyst.

Redemonstrated chronic lacunar infarct within the right basal
ganglia.

Mild (but age-advanced) chronic small-vessel ischemic changes within
the cerebral white matter.

## 2021-07-03 MED ORDER — POTASSIUM CHLORIDE 2 MEQ/ML IV SOLN
INTRAVENOUS | Status: DC
  Filled 2021-07-03: qty 1000

## 2021-07-03 MED ORDER — IOHEXOL 300 MG/ML  SOLN
100.0000 mL | Freq: Once | INTRAMUSCULAR | Status: AC | PRN
  Administered 2021-07-03: 100 mL via INTRAVENOUS

## 2021-07-03 MED ORDER — POTASSIUM CL IN DEXTROSE 5% 20 MEQ/L IV SOLN
20.0000 meq | INTRAVENOUS | Status: DC
  Administered 2021-07-03 (×2): 20 meq via INTRAVENOUS
  Filled 2021-07-03 (×3): qty 1000

## 2021-07-03 MED ORDER — NAPHAZOLINE-GLYCERIN 0.012-0.25 % OP SOLN
2.0000 [drp] | Freq: Three times a day (TID) | OPHTHALMIC | Status: DC
  Administered 2021-07-03 – 2021-07-07 (×12): 2 [drp] via OPHTHALMIC
  Filled 2021-07-03: qty 15

## 2021-07-03 MED ORDER — ASPIRIN 300 MG RE SUPP: 150.0000 mg | Freq: Every day | RECTAL | Status: DC

## 2021-07-03 MED ORDER — ASPIRIN EC 81 MG PO TBEC
81.0000 mg | DELAYED_RELEASE_TABLET | Freq: Every day | ORAL | Status: DC
Start: 2021-07-04 — End: 2021-07-08
  Administered 2021-07-04 – 2021-07-07 (×4): 81 mg via ORAL
  Filled 2021-07-03 (×4): qty 1

## 2021-07-03 NOTE — Progress Notes (Signed)
Patient is going to MRI. Just got off the phone with Edison Nasuti. Told me transport requested 10 minutes ago to pick up patient. Will hook up later.  ?

## 2021-07-03 NOTE — Progress Notes (Signed)
Speech Language Pathology Treatment: Dysphagia  ?Patient Details ?Name: Charles [name] ?MRN: [id number] ?DOB: 20-Apr-1960 ?Today's Date: 07/03/2021 ?Time: 2952-8413 ?SLP Time Calculation (min) (ACUTE ONLY): 20 min ? ?Assessment / Plan / Recommendation ?Clinical Impression ? Patient seen by SLP for skilled treatment focused on dysphagia goals. Both patient's RN and MD notified SLP via secure Epic message that patient was significantly more alert today. When SLP entered room, patient was awake and alert and able to verbally communicate without difficulty. Voice was clear, a little low in intensity but significantly improved since previous date. Patient did present with some confusion, required cues to initiate with oral care and responses to questions were delayed. He was able to hold cup and take both cup and straw sips of water with timely swallow initiation and no overt s/s aspiration or penetration. After setup assist, was able to pick up crackers from table and feed self puree (applesauce) via spoon. He exhibited mildly prolonged mastication with crackers but no oral holding/retention of bolus and no residuals post swallows. SLP is recommending initiate Dys 3 solids, thin liquids diet at this time and SLP will follow for toleration and ability to upgrade to regular solid textures. ? ?  ?HPI HPI: Patient is a 61 y.o. male with PMH: insulin-dependent DM, CAD, urinary retention with indwelling catheter who presented to the ED with lethargy and confusion. His sister had spoken with him on the phone in AM day before admission and he told her he was tired and jus wanted to sleep but on day of admission he was found by his home health therapist to be lethargic and confused. EMS was called and patient found to have BP of 76/38. Upon arrival in ED, patient's rectal temperature was 89 degrees F and systolic pressure was as low as 78. EKG features sinus bradycardia with incomplete LBBB. CXR was negative for acute  cardiopulmonary disease, UA concerning for infection. ?  ?   ?SLP Plan ? Continue with current plan of care ? ?  ?  ?Recommendations for follow up therapy are one component of a multi-disciplinary discharge planning process, led by the attending physician.  Recommendations may be updated based on patient status, additional functional criteria and insurance authorization. ?  ? ?Recommendations  ?Diet recommendations: Dysphagia 3 (mechanical soft);Thin liquid ?Liquids provided via: Cup;Straw ?Medication Administration: Whole meds with liquid ?Supervision: Patient able to self feed;Intermittent supervision to cue for compensatory strategies ?Compensations: Slow rate;Small sips/bites;Minimize environmental distractions ?Postural Changes and/or Swallow Maneuvers: Seated upright 90 degrees  ?   ?    ?   ? ? ? ? Oral Care Recommendations: Oral care BID ?Follow Up Recommendations: No SLP follow up ?Assistance recommended at discharge: Intermittent Supervision/Assistance ?SLP Visit Diagnosis: Dysphagia, unspecified (R13.10) ?Plan: Continue with current plan of care ? ? ? ? ?  ?  ? ? ?Angela Nevin, MA, CCC-SLP ?Speech Therapy ? ?

## 2021-07-03 NOTE — Progress Notes (Signed)
EEG complete - results pending 

## 2021-07-03 NOTE — Progress Notes (Addendum)
?      ?                 PROGRESS NOTE ? ?      ?PATIENT DETAILS ?Name: Charles [name] ?Age: 61 y.o. ?Sex: male ?Date of Birth: 1961-01-13 ?Admit Date: 06/30/2021 ?Admitting Physician Briscoe Deutscher, MD ?VPX:[id number], [name], MD ? ?Brief Summary: ?Patient is a 61 y.o. [place] male with a history of chronic indwelling Foley catheter, DM-2, CAD s/p CABG, HLD-who presented to the hospital with acute metabolic encephalopathy due to sepsis from complicated UTI.  See below for further details. ? ?Significant events: ?3/25-3/27>> hospitalization for hypoglycemia/encephalopathy-refused SNF. ?4/20>> found at home by home health-lethargic/confused-hypotensive-hypothermic.  Admit to [facility]. ? ?Significant studies: ?4/20>> CXR: No PNA ?4/20>> renal ultrasound: Trace right perinephric fluid, echogenic debris in the distended bladder. ? ?Significant microbiology data: ?4/20>> COVID/influenza PCR: Negative ?4/20>> blood culture: No growth ?4/20>> urine culture: Pending ? ?Procedures: ? ?CT Head - premature ventriculomegaly, generalized atrophy. ? ?CT chest abdomen pelvis -  1. Severe hepatic steatosis. 2. Small volume of ascites and diffuse mesenteric edema. 3. Small bilateral pleural effusions lying dependently with some passive subsegmental atelectasis in the lower lobes of the lungs bilaterally. 4. Aortic atherosclerosis, in addition to left main and three-vessel coronary artery disease. Status post median sternotomy for CABG including LIMA to the LAD. ? ?MRI brain -  ? ?Consults: ?None  ? ?Subjective:  Patient in bed, appears comfortable, denies any headache, no fever, no chest pain or pressure, no shortness of breath , no abdominal pain. No new focal weakness. ? ? ? ?Objective:  ? ?Vitals: ?Blood pressure 97/61, pulse 62, temperature 97.6 ?F (36.4 ?C), temperature source Oral, resp. rate 11, height 5\' 6"  (1.676 m), weight 55 kg, SpO2 100 %.  ? ?Exam: ? ?Awake but appears extremely weak and dehydrated, more alert 07/03/21, no neck  stiffness, negative Kernig's and Brudzinski's ?[place].AT,PERRAL ?Supple Neck, No JVD,   ?Symmetrical Chest wall movement, Good air movement bilaterally, CTAB ?RRR,No Gallops, Rubs or new Murmurs,  ?+ve B.Sounds, Abd Soft, No tenderness,   ?No Cyanosis, Clubbing or edema  ? ? ? ?Assessment/Plan: ? ?Sepsis due to complicated UTI: In a patient with indwelling Foley catheter who missed his urology outpatient appointment, urine appears clear, Foley being flushed, continue empiric IV antibiotics along with aggressive hydration.  Sepsis pathophysiology seems to have considerably improved.  Will monitor closely. ? ?Acute metabolic encephalopathy: Due to sepsis-severe dehydration, CT head shows signs of advanced vascular dementia, go ahead and check MRI, EEG, TSH, lipid panel, A1c, RPR, B12, folate, acute hepatitis panel, HIV screen, no headache, negative Kernig's and Brezinski sign.  PT, OT and speech to monitor as well.  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  Will place on aspirin for now. ? ?Anion gap metabolic acidosis: Suspect due to combination of sepsis/hypotension/mild renal insufficiency/starvation ketoacidosis/metformin.  Resolved after IV bicarb and supportive care will monitor ? ?Hypokalemia/hypomagnesemia/hypophosphatemia: Aggressively replaced will monitor. ? ?CAD s/p CABG: No obvious anginal symptoms. ? ?HLD: Hold statin until encephalopathy improves. ? 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 ? ?Insulin-dependent DM-2 (A1c 10.16 April 2021): CBG stable-currently stable on just SSI.  Metformin/Lantus on hold-until he is more awake alert and able to continue oral intake. ? ?Recent Labs  ?  07/02/21 ?2344 07/03/21 ?0329 07/03/21 ?0732  ?GLUCAP 104* 107* 108*  ?  ? ? ?  BMI: ?Estimated body mass index is 19.57 kg/m? as calculated from the following: ?   Height as of this encounter: 5\' 6"  (1.676 m). ?  Weight as of this encounter: 55 kg.  ? ?Code status: ?  Code Status: Full Code  ? ?DVT Prophylaxis: ?heparin injection 5,000 Units Start: 06/30/21 2230 ?  ?Family Communication: Sister [name]-updated over the phone on 07/02/21, 07/03/21 ? ? ?Disposition Plan: ?Status is: Inpatient ?Remains inpatient appropriate because: Sepsis from complicated UTI-encephalopathy-still very confused-sepsis physiology not yet resolved.  Not stable for discharge. ?  ?Planned Discharge Destination:Skilled nursing facility ? ? ?Diet: ?Diet Order   ? ?       ?  Diet NPO time specified Except for: Ice Chips  Diet effective now       ?  ? ?  ?  ? ?  ?  ? ? ?Antimicrobial agents: ?Anti-infectives (From admission, onward)  ? ? Start     Dose/Rate Route Frequency Ordered Stop  ? 07/02/21 1400  ceFEPIme (MAXIPIME) 2 g in sodium chloride 0.9 % 100 mL IVPB       ? 2 g ?200 mL/hr over 30 Minutes Intravenous Every 8 hours 07/02/21 0759    ? 07/01/21 1700  vancomycin (VANCOCIN) IVPB 1000 mg/200 mL premix  Status:  Discontinued       ? 1,000 mg ?200 mL/hr over 60 Minutes Intravenous Every 24 hours 06/30/21 1813 06/30/21 2226  ? 07/01/21 0600  ceFEPIme (MAXIPIME) 2 g in sodium chloride 0.9 % 100 mL IVPB  Status:  Discontinued       ? 2 g ?200 mL/hr over 30 Minutes Intravenous Every 12 hours 06/30/21 1813 07/02/21 0759  ? 06/30/21 1645  metroNIDAZOLE (FLAGYL) IVPB 500 mg       ? 500 mg ?100 mL/hr over 60 Minutes Intravenous  Once 06/30/21 1638 06/30/21 1818  ? 06/30/21 1645  ceFEPIme (MAXIPIME) 2 g in sodium chloride 0.9 % 100 mL IVPB       ? 2 g ?200 mL/hr over 30 Minutes Intravenous  Once 06/30/21 1643 06/30/21 1736  ? 06/30/21 1645  vancomycin (VANCOREADY) IVPB 1250 mg/250 mL       ? 1,250 mg ?166.7 mL/hr over 90 Minutes Intravenous  Once 06/30/21 1643 06/30/21 1926  ? ?  ? ? ? ?MEDICATIONS: ?Scheduled Meds: ? aspirin  150 mg Rectal Daily  ? Chlorhexidine Gluconate Cloth  6 each Topical  Daily  ? heparin  5,000 Units Subcutaneous Q8H  ? insulin aspart  0-9 Units Subcutaneous Q4H  ? naphazoline-glycerin  2 drop Both Eyes TID  ? sodium chloride flush  3 mL Intravenous Q12H  ? ?Continuous Infusions: ? ceFEPime (MAXIPIME) IV 2 g (07/03/21 0527)  ? dextrose 5 % with KCl 20 mEq / L    ? ?PRN Meds:.acetaminophen **OR** acetaminophen ? ? ?I have personally reviewed following labs and imaging studies ? ?LABORATORY DATA: ? ?Recent Labs  ?Lab 06/30/21 ?1637 07/01/21 ?0213 07/02/21 ?0246 07/03/21 ?0152  ?WBC 6.6 6.1 10.4 10.4  ?HGB 12.6* 11.2* 11.2* 11.2*  ?HCT 34.7* 30.5* 29.9* 31.9*  ?PLT 179 164 124* 106*  ?MCV 83.8 81.8 82.6 85.1  ?MCH 30.4 30.0 30.9 29.9  ?MCHC 36.3* 36.7* 37.5* 35.1  ?RDW 15.1 15.0 15.5 15.6*  ?LYMPHSABS 0.6*  --   --  0.8  ?MONOABS 0.8  --   --  1.1*  ?EOSABS 0.0  --   --  0.0  ?BASOSABS 0.0  --   --  0.0  ? ? ?  Recent Labs  ?Lab 06/30/21 ?1637 06/30/21 ?1722 06/30/21 ?2337 07/01/21 ?[id number] 07/01/21 ?1024 07/02/21 ?0246 07/03/21 ?0152  ?NA 139  --  140 139 140 138 137  ?K 3.4*  --  3.3* 3.3* 3.5 2.7* 3.8  ?CL 106  --  110 110 108 100 103  ?CO2 13*  --  11* 11* 14* 24 25  ?GLUCOSE 273*  --  198* 133* 141* 94 107*  ?BUN 28*  --  24* 21* 20 11 7   ?CREATININE 1.22  --  1.20 1.26* 1.24 0.91 0.79  ?CALCIUM 8.9  --  8.4* 8.4* 8.1* 7.4* 7.9*  ?AST 16  --   --  17  --  27 24  ?ALT 17  --   --  17  --  20 19  ?ALKPHOS 121  --   --  100  --  96 90  ?BILITOT 1.0  --   --  1.8*  --  1.0 0.9  ?ALBUMIN 2.9*  --   --  2.3*  --  2.1* 1.9*  ?MG  --   --   --  1.1* 2.2 1.5* 1.8  ?CRP  --   --   --   --   --   --  16.9*  ?PROCALCITON  --   --   --   --   --  <0.10 <0.10  ?LATICACIDVEN 1.6  --   --   --   --   --   --   ?INR  --  1.1  --   --   --   --   --   ?BNP  --   --   --   --   --   --  152.5*  ? ? ? ?RADIOLOGY STUDIES/RESULTS: ? ?CT HEAD WO CONTRAST (5MM) ? ?Result Date: 07/02/2021 ?CLINICAL DATA:  [place]ir[place] EXAM: CT HEAD WITHOUT CONTRAST TECHNIQUE: Contiguous axial images were obtained from the base of  the skull through the vertex without intravenous contrast. RADIATION DOSE REDUCTION: This exam was performed according to the departmental dose-optimization program which includes automated exposure cont

## 2021-07-03 NOTE — Procedures (Signed)
Patient Name: Charles [name]  ?MRN: [id number]  ?Epilepsy Attending: Lora Havens  ?Referring Physician/Provider: Thurnell Lose, MD ?Date: 07/03/2021 ?Duration: 21.38 mins ? ?Patient history: 61yo M with ams. EEG to evaluate for seizure.  ? ?Level of alertness: Awake ? ?AEDs during EEG study: None ? ?Technical aspects: This EEG study was done with scalp electrodes positioned according to the 10-20 International system of electrode placement. Electrical activity was acquired at a sampling rate of 500Hz  and reviewed with a high frequency filter of 70Hz  and a low frequency filter of 1Hz . EEG data were recorded continuously and digitally stored.  ? ?Description: No clear posterior dominant rhythm was seen. EEG showed continuous generalized and lateralized right hemisphere 3 to 6 Hz theta-delta slowing. Physiologic photic driving was not seen during photic stimulation.  Hyperventilation was not performed.    ? ?ABNORMALITY ?- Continuous slow, generalized and lateralized right hemisphere ? ?IMPRESSION: ?This study is suggestive of cortical dysfunction arising from right hemisphere, non specific etiology but could be secondary to underlying stroke. Additionally there is moderate diffuse encephalopathy, nonspecific etiology. No seizures or epileptiform discharges were seen throughout the recording. ? ?Lora Havens  ? ?

## 2021-07-03 NOTE — Progress Notes (Signed)
Inpatient Rehabilitation Admissions Coordinator  ? ?SNF continues to be recommended by therapy as was last admit. CIR can not be pursued with [employer] with  SNF recommendations. We will sign off. ? ?Ottie Glazier, RN, MSN ?Rehab Admissions Coordinator ?[phone] ?07/03/2021 6:04 PM ? ?

## 2021-07-04 ENCOUNTER — Ambulatory Visit: Payer: Managed Care, Other (non HMO) | Admitting: Urology

## 2021-07-04 DIAGNOSIS — A419 Sepsis, unspecified organism: Secondary | ICD-10-CM | POA: Diagnosis not present

## 2021-07-04 DIAGNOSIS — N390 Urinary tract infection, site not specified: Secondary | ICD-10-CM | POA: Diagnosis not present

## 2021-07-04 LAB — CBC WITH DIFFERENTIAL/PLATELET
Abs Immature Granulocytes: 0.09 10*3/uL — ABNORMAL HIGH (ref 0.00–0.07)
Basophils Absolute: 0 10*3/uL (ref 0.0–0.1)
Basophils Relative: 0 %
Eosinophils Absolute: 0.1 10*3/uL (ref 0.0–0.5)
Eosinophils Relative: 1 %
HCT: 30 % — ABNORMAL LOW (ref 39.0–52.0)
Hemoglobin: 10.6 g/dL — ABNORMAL LOW (ref 13.0–17.0)
Immature Granulocytes: 1 %
Lymphocytes Relative: 8 %
Lymphs Abs: 0.7 10*3/uL (ref 0.7–4.0)
MCH: 30 pg (ref 26.0–34.0)
MCHC: 35.3 g/dL (ref 30.0–36.0)
MCV: 85 fL (ref 80.0–100.0)
Monocytes Absolute: 1 10*3/uL (ref 0.1–1.0)
Monocytes Relative: 11 %
Neutro Abs: 7.7 10*3/uL (ref 1.7–7.7)
Neutrophils Relative %: 79 %
Platelets: 100 10*3/uL — ABNORMAL LOW (ref 150–400)
RBC: 3.53 MIL/uL — ABNORMAL LOW (ref 4.22–5.81)
RDW: 15.1 % (ref 11.5–15.5)
WBC: 9.6 10*3/uL (ref 4.0–10.5)
nRBC: 0 % (ref 0.0–0.2)

## 2021-07-04 LAB — RPR: RPR Ser Ql: NONREACTIVE

## 2021-07-04 LAB — COMPREHENSIVE METABOLIC PANEL
ALT: 18 U/L (ref 0–44)
AST: 21 U/L (ref 15–41)
Albumin: 1.8 g/dL — ABNORMAL LOW (ref 3.5–5.0)
Alkaline Phosphatase: 104 U/L (ref 38–126)
Anion gap: 5 (ref 5–15)
BUN: 5 mg/dL — ABNORMAL LOW (ref 6–20)
CO2: 24 mmol/L (ref 22–32)
Calcium: 8.4 mg/dL — ABNORMAL LOW (ref 8.9–10.3)
Chloride: 106 mmol/L (ref 98–111)
Creatinine, Ser: 0.69 mg/dL (ref 0.61–1.24)
GFR, Estimated: >60 mL/min (ref >60)
Glucose, Bld: 141 mg/dL — ABNORMAL HIGH (ref 70–99)
Potassium: 4.1 mmol/L (ref 3.5–5.1)
Sodium: 135 mmol/L (ref 135–145)
Total Bilirubin: 0.1 mg/dL — ABNORMAL LOW (ref 0.3–1.2)
Total Protein: 4.7 g/dL — ABNORMAL LOW (ref 6.5–8.1)

## 2021-07-04 LAB — C-REACTIVE PROTEIN: CRP: 16.7 mg/dL — ABNORMAL HIGH (ref <1.0)

## 2021-07-04 LAB — GLUCOSE, CAPILLARY
Glucose-Capillary: 123 mg/dL — ABNORMAL HIGH (ref 70–99)
Glucose-Capillary: 138 mg/dL — ABNORMAL HIGH (ref 70–99)
Glucose-Capillary: 138 mg/dL — ABNORMAL HIGH (ref 70–99)
Glucose-Capillary: 169 mg/dL — ABNORMAL HIGH (ref 70–99)
Glucose-Capillary: 257 mg/dL — ABNORMAL HIGH (ref 70–99)
Glucose-Capillary: 296 mg/dL — ABNORMAL HIGH (ref 70–99)

## 2021-07-04 LAB — CORTISOL: Cortisol, Plasma: 15.4 ug/dL

## 2021-07-04 LAB — PROCALCITONIN: Procalcitonin: <0.1 ng/mL

## 2021-07-04 LAB — MAGNESIUM: Magnesium: 1.5 mg/dL — ABNORMAL LOW (ref 1.7–2.4)

## 2021-07-04 LAB — BRAIN NATRIURETIC PEPTIDE: B Natriuretic Peptide: 134.1 pg/mL — ABNORMAL HIGH (ref 0.0–100.0)

## 2021-07-04 MED ORDER — ROSUVASTATIN CALCIUM 5 MG PO TABS
10.0000 mg | ORAL_TABLET | Freq: Every day | ORAL | Status: DC
  Administered 2021-07-04 – 2021-07-07 (×4): 10 mg via ORAL
  Filled 2021-07-04 (×4): qty 2

## 2021-07-04 MED ORDER — MIDODRINE HCL 5 MG PO TABS
10.0000 mg | ORAL_TABLET | Freq: Three times a day (TID) | ORAL | Status: DC
  Administered 2021-07-04 – 2021-07-07 (×9): 10 mg via ORAL
  Filled 2021-07-04 (×10): qty 2

## 2021-07-04 MED ORDER — MAGNESIUM SULFATE 4 GM/100ML IV SOLN
4.0000 g | Freq: Once | INTRAVENOUS | Status: AC
  Administered 2021-07-04: 4 g via INTRAVENOUS
  Filled 2021-07-04: qty 100

## 2021-07-04 MED ORDER — ORAL CARE MOUTH RINSE
15.0000 mL | Freq: Two times a day (BID) | OROMUCOSAL | Status: DC
  Administered 2021-07-04 – 2021-07-07 (×5): 15 mL via OROMUCOSAL

## 2021-07-04 MED ORDER — BOOST PLUS PO LIQD
237.0000 mL | Freq: Three times a day (TID) | ORAL | Status: DC
Start: 2021-07-04 — End: 2021-07-08
  Administered 2021-07-04 – 2021-07-07 (×9): 237 mL via ORAL
  Filled 2021-07-04 (×13): qty 237

## 2021-07-04 MED ORDER — LACTATED RINGERS IV SOLN: INTRAVENOUS | Status: AC

## 2021-07-04 MED ORDER — LACTATED RINGERS IV BOLUS
1000.0000 mL | Freq: Once | INTRAVENOUS | Status: AC
Start: 2021-07-04 — End: 2021-07-04
  Administered 2021-07-04: 1000 mL via INTRAVENOUS

## 2021-07-04 MED ORDER — SODIUM CHLORIDE 0.9 % IV SOLN: INTRAVENOUS | Status: DC | PRN

## 2021-07-04 MED ORDER — PROSOURCE PLUS PO LIQD
30.0000 mL | Freq: Two times a day (BID) | ORAL | Status: DC
  Administered 2021-07-04 – 2021-07-07 (×6): 30 mL via ORAL
  Filled 2021-07-04 (×7): qty 30

## 2021-07-04 MED ORDER — LACTATED RINGERS IV BOLUS
500.0000 mL | Freq: Once | INTRAVENOUS | Status: AC
  Administered 2021-07-04: 500 mL via INTRAVENOUS

## 2021-07-04 NOTE — [place] FL2 (Signed)
?[place] MEDICAID FL2 LEVEL OF CARE SCREENING TOOL  ?  ? ?IDENTIFICATION  ?Patient Name: ?Charles [name] Birthdate: 23-Jan-1961 Sex: male Admission Date (Current Location): ?06/30/2021  ?[place] and [place] Number: ? [place] ?  Facility and Address:  ?[facility] [place] [address] [place], [place] [address] ?     Provider Number: ?[id number]  ?Attending Physician Name and Address:  ?Thurnell Lose, MD ? Relative Name and Phone Number:  ?  ?   ?Current Level of Care: ?Hospital Recommended Level of Care: ?[place] Prior Approval Number: ?  ? ?Date Approved/Denied: ?  PASRR Number: ?[id number] ? ?Discharge Plan: ?SNF ?  ? ?Current Diagnoses: ?Patient Active Problem List  ? Diagnosis Date Noted  ? Pressure injury of skin 07/02/2021  ? Sepsis secondary to UTI ([place]) 06/30/2021  ? Hypothermia 06/30/2021  ? Metabolic acidosis [id number]  ? AKI (acute kidney injury) ([place]) 06/30/2021  ? Acute encephalopathy 06/04/2021  ? Diarrhea 04/24/2021  ? Urinary retention 04/23/2021  ? Nausea & vomiting 04/22/2021  ? Dehydration 04/22/2021  ? Acute prerenal azotemia 04/22/2021  ? Leukocytosis 04/22/2021  ? HLD (hyperlipidemia) 04/22/2021  ? Metabolic encephalopathy 0000000  ? DKA (diabetic ketoacidosis) ([place]) 04/21/2021  ? 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/23/2021  ? Type 2 diabetes mellitus with complication, with long-term current use of insulin ([place]) 03/23/2021  ? BPH with obstruction/lower urinary tract symptoms 02/08/2021  ? Phimosis 01/27/2021  ? Hypertension 12/23/2020  ? Coronary artery disease involving native coronary artery of native heart without angina pectoris 12/23/2020  ? ? ?Orientation RESPIRATION BLADDER Height & Weight   ?  ?Self, Place ? Normal Incontinent, Indwelling catheter Weight: 128 lb 8.5 oz (58.3 kg) (Simultaneous filing. User may not have seen previous data.) ?Height:  5\' 6"  (167.6 cm)  ?BEHAVIORAL SYMPTOMS/MOOD NEUROLOGICAL BOWEL NUTRITION STATUS  ?    Incontinent Diet (See dc  summary)  ?AMBULATORY STATUS COMMUNICATION OF NEEDS Skin   ?Extensive Assist Verbally PU Stage and Appropriate Care (Stage II on buttocks w/foam dressing) ?  ?  ?  ?    ?     ?     ? ? ?Personal Care Assistance Level of Assistance  ?Bathing, Feeding, Dressing Bathing Assistance: Maximum assistance ?Feeding assistance: Limited assistance ?Dressing Assistance: Limited assistance ?   ? ?Functional Limitations Info  ?    ?  ?   ? ? ?SPECIAL CARE FACTORS FREQUENCY  ?PT (By licensed PT), OT (By licensed OT)   ?  ?PT Frequency: 5x/week ?OT Frequency: 5x/week ?  ?  ?  ?   ? ? ?Contractures Contractures Info: Not present  ? ? ?Additional Factors Info  ?Code Status, Allergies, Insulin Sliding Scale Code Status Info: Full ?Allergies Info: NKA ?  ?Insulin Sliding Scale Info: See dc summary ?  ?   ? ?Current Medications (07/04/2021):  This is the current hospital active medication list ?Current Facility-Administered Medications  ?Medication Dose Route Frequency Provider Last Rate Last Admin  ? (feeding supplement) PROSource Plus liquid 30 mL  30 mL Oral BID BM Thurnell Lose, MD   30 mL at 07/04/21 0941  ? acetaminophen (TYLENOL) tablet 650 mg  650 mg Oral Q6H PRN Opyd, Ilene Qua, MD      ? Or  ? acetaminophen (TYLENOL) suppository 650 mg  650 mg Rectal Q6H PRN Opyd, Ilene Qua, MD      ? aspirin EC tablet 81 mg  81 mg Oral Daily Thurnell Lose, MD  81 mg at 07/04/21 0941  ? ceFEPIme (MAXIPIME) 2 g in sodium chloride 0.9 % 100 mL IVPB  2 g Intravenous Q8H Thurnell Lose, MD 200 mL/hr at 07/04/21 0548 2 g at 07/04/21 0548  ? Chlorhexidine Gluconate Cloth 2 % PADS 6 each  6 each Topical Daily Jonetta Osgood, MD   6 each at 07/03/21 [phone]  ? dextrose 5 % with KCl 20 mEq / L  infusion  20 mEq Intravenous Continuous Thurnell Lose, MD 100 mL/hr at 07/03/21 2341 20 mEq at 07/03/21 2341  ? heparin injection 5,000 Units  5,000 Units Subcutaneous Q8H Vianne Bulls, MD   5,000 Units at 07/04/21 0549  ? insulin aspart  (novoLOG) injection 0-9 Units  0-9 Units Subcutaneous Q4H Opyd, Ilene Qua, MD   1 Units at 07/04/21 0825  ? lactated ringers infusion   Intravenous Continuous Thurnell Lose, MD 75 mL/hr at 07/04/21 0935 New Bag at 07/04/21 0935  ? lactose free nutrition (BOOST PLUS) liquid 237 mL  237 mL Oral TID WC Thurnell Lose, MD   237 mL at 07/04/21 0826  ? midodrine (PROAMATINE) tablet 10 mg  10 mg Oral TID WC Thurnell Lose, MD   10 mg at 07/04/21 0941  ? naphazoline-glycerin (CLEAR EYES REDNESS) ophth solution 2 drop  2 drop Both Eyes TID Thurnell Lose, MD   2 drop at 07/04/21 0947  ? rosuvastatin (CRESTOR) tablet 10 mg  10 mg Oral Daily Thurnell Lose, MD   10 mg at 07/04/21 0941  ? sodium chloride flush (NS) 0.9 % injection 3 mL  3 mL Intravenous Q12H Opyd, Ilene Qua, MD   3 mL at 07/04/21 0947  ? ? ? ?Discharge Medications: ?Please see discharge summary for a list of discharge medications. ? ?Relevant Imaging Results: ? ?Relevant Lab Results: ? ? ?Additional Information ?SSN: [id number] ? ?Lissa Morales Lam Mccubbins, LCSW ? ? ? ? ?

## 2021-07-04 NOTE — TOC Initial Note (Addendum)
Transition of Care (TOC) - Initial/Assessment Note  ? ? ?Patient Details  ?Name: Charles DexterManny Blaszczyk ?MRN: [id number] ?Date of Birth: 09-May-1960 ? ?Transition of Care (TOC) CM/SW Contact:    ?Mearl LatinNadia S Aristotelis Vilardi, LCSW ?Phone Number: ?07/04/2021, 2:52 PM ? ?Clinical Narrative:                 ?CSW received consult for possible SNF placement at time of discharge. CSW spoke with patient's sister, Maxine GlennMonica, who lives in [place] and is his only relative [place]. She reported that patient is unable to care for himself given patient?s current physical needs and fall risk. She reported that he has been forgetful and has not worked since last year, which she just found out about. She expressed understanding of PT recommendation and is agreeable to SNF placement at time of discharge. CSW discussed insurance authorization process and provided Medicare SNF ratings list. CSW emailed current SNF bed offers as well as information on how to apply for Medicaid/Disability. She also requested a list of agencies that may help clean patient's condo while he is in rehab, which CSW also emailed. [facility] [place] [facility] [place] but CSW explained that a SNF choice needs to be made rather quickly as patient may be ready to discharge in the next day or so.  ? ?Update: Patient's sister [facility] and has selected them. CSW requested they begin insurance authorization and left voicemail for Lupita LeashDonna with [employer]. CSW attempted to have patient's sister on speaker phone to speak with the patient but he would only open his eyes and frown.  ? ? ? ?Skilled Nursing Rehab Facilities-   ShinProtection.co.ukhttps://www.medicare.gov/care-compare/   Ratings out of 5 possible   ?Name Address  Phone # Quality Care Staffing Health Inspection Overall  ?[facility] [address], [place] [phone] 4 5 2 3   ?[facility]  [address], [place] [phone] 3 2 5 5   ?[facility] [address], [place]  [phone] 3 1 1 1   ?[facility] [address], [place] [phone] 3 2 4 4   ?[facility] [address], [place] [phone] 1 1 2 1   ?[facility] [address], [place] [phone] 2 1 4 3   ?[facility] [address], [place] [phone] 5 2 3 4   ?[facility] [address], [place] [phone] 5 2 2 3   ?[facility] [address], [place] [phone] 5 1 2 2   ?[facility] [address], [place] [phone] 4 1 2 1   ?[facility] [address], [place] [phone] 4 1 2 1   ?[facility] [address], [place] [phone] 4 1 1 1   ?[facility] [address], [place] [phone] 3 2 4 4   ?        ?[facility] [address], [place] [phone]      ?[facility] [address], [place] [phone] 4 2 3 3   ?[facility] [address], [place] [phone] 4 1 5 4   ?[facility] [place] [address] [place], [place] [phone] 2 1 1 1   ?[facility] [address], [place] [phone] 2 1 3 2   ?        ?[address] (no [facility]) [address], [place] [phone] 4 5 5 5   ?[facility] (No [employer]) [address] [place], [place] [phone] 3 1 4 3   ?[facility] (No [facility]) [address], [place] [phone] 5 5 5 5   ?[facility] [address], [place] [phone] 3 2 4 4   ?[facility] [address], [address] [place] 1 1 2 1   ?[facility] [address], [facility] 2 1 1 1   ?[facility] [address] [phone] 5 2 4 5   ?[facility] [address] [place] Shi [phone] 3 1 1 1   ?[facility] [address] [place], [place] [phone] 2 1 2 1   ?        ?[facility] [address], [place] [phone] 1 1 1 1   ?[facility] [address], [place]  [phone] 2 4 2 2   ?[facility] [address], [facility] [phone] 5 2 3 4   ?[facility] [place] [address], [place] [phone] 2 1 1 1   ?[facility] (No [employer]) [address], [phone] 2 1 3 2   ?[facility] [address], [phone] 3 1 1 1   ?        ?[facility] [address] [place], [address] 5 4 5 5   ?[facility])  [address] [facility] [address] 2 2 3 3   ?[facility]) [address], [facility] 3 2 4 4   ?[facility] [address], [place] [phone] 4 3 4 4   ?[address] [place], [address] [place] 3 3 1 1   ?[facility]) [address] [place] [phone] 2 2 4 4   ? ? ? ?Expected Discharge Plan: Skilled Nursing Facility ?Barriers to Discharge: Continued Medical Work up, [phone], SNF Pending bed offer ? ? ?Patient Goals and CMS Choice ?Patient states their goals for this hospitalization and ongoing recovery are:: Get stonger ?CMS Medicare.gov Compare Post Acute Care list provided to:: Patient Represenative (must comment) ?Choice offered to / list presented to : Sibling ? ?Expected Discharge Plan and Services ?Expected Discharge Plan: Skilled Nursing Facility ?In-house Referral: Clinical Social Work ?  ?Post Acute Care Choice: Skilled Nursing Facility ?Living arrangements for the past 2 months: Single Family Home ?                ?  ?  ?  ?  ?  ?  ?  ?  ?  ?  ? ?Prior Living Arrangements/Services ?Living arrangements for the past 2 months: Single Family Home ?Lives with:: Self ?Patient language and need for interpreter reviewed:: Yes ?Do you feel safe going back to the place where you live?: Yes      ?Need for Family Participation in Patient Care: Yes (Comment) ?Care giver support system in place?: No (comment) ?  ?Criminal Activity/Legal Involvement Pertinent to Current Situation/Hospitalization: No - Comment as needed ? ?Activities of Daily  Living ?  ?  ? ?Permission Sought/Granted ?Permission sought to share information with : Facility [phone], Family Supports ?Permission granted to share information with : Yes, Verbal Permission Granted ? Share Information with NAME: ? Permission granted to share info w AGENCY: SNFs ? Permission granted to share info w Relationship: Sister ? Permission granted to share info w Contact Information: [phone] ? ?Emotional Assessment ?Appearance:: Appears stated age ?Attitude/Demeanor/Rapport: Unable to Assess ?Affect (typically observed): Unable to Assess ?Orientation: : Oriented to Self, Oriented to Place ?Alcohol / Substance Use: Not Applicable ?Psych Involvement: No (comment) ? ?Admission diagnosis:  Metabolic encephalopathy [G93.41] ?Sepsis secondary to UTI (HCC) [A41.9, N39.0] ?Hypothermia, initial encounter [  T68.XXXA] ?Hypotension, unspecified hypotension type [I95.9] ?Urinary tract infection without hematuria, site unspecified [N39.0] ?Patient Active Problem List  ? Diagnosis Date Noted  ? Pressure injury of skin 07/02/2021  ? Sepsis secondary to UTI (HCC) 06/30/2021  ? Hypothermia 06/30/2021  ? Metabolic acidosis 06/30/2021  ? AKI (acute kidney injury) (HCC) 06/30/2021  ? Acute encephalopathy 06/04/2021  ? Diarrhea 04/24/2021  ? Urinary retention 04/23/2021  ? Nausea & vomiting 04/22/2021  ? Dehydration 04/22/2021  ? Acute prerenal azotemia 04/22/2021  ? Leukocytosis 04/22/2021  ? HLD (hyperlipidemia) 04/22/2021  ? Metabolic encephalopathy 04/22/2021  ? DKA (diabetic ketoacidosis) (HCC) 04/21/2021  ? 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/23/2021  ? Type 2 diabetes mellitus with complication, with long-term current use of insulin (HCC) 03/23/2021  ? BPH with obstruction/lower urinary tract symptoms 02/08/2021  ? Phimosis 01/27/2021  ? Hypertension 12/23/2020  ? Coronary artery disease involving native coronary artery of native heart without angina pectoris 12/23/2020  ? ?PCP:  Pearline Cables,  MD ?Pharmacy:   ?[id number] [place] [place] - [address] ?[address] ?[place] [address] ?Phone: [phone] Fax: [phone] ? ? ? ? ?Social Determinants of Health (SDOH) Interventions ?

## 2021-07-04 NOTE — Progress Notes (Signed)
?   07/04/21 0749  ?Assess: MEWS Score  ?Temp (!) 97.5 ?F (36.4 ?C)  ?BP (!) 86/58  ?Pulse Rate 67  ?ECG Heart Rate 67  ?Resp 13  ?Level of Consciousness Alert  ?SpO2 100 %  ?O2 Device Room Air  ?Assess: MEWS Score  ?MEWS Temp 0  ?MEWS Systolic 1  ?MEWS Pulse 0  ?MEWS RR 1  ?MEWS LOC 0  ?MEWS Score 2  ?MEWS Score Color Yellow  ?Treat  ?Pain Scale 0-10  ?Pain Score 0  ?Take Vital Signs  ?Increase Vital Sign Frequency  Yellow: Q 2hr X 2 then Q 4hr X 2, if remains yellow, continue Q 4hrs  ?Escalate  ?MEWS: Escalate Yellow: discuss with charge nurse/RN and consider discussing with provider and RRT  ?Notify: Charge Nurse/RN  ?Name of Charge Nurse/RN Notified [place] RN 5W  ?Date Charge Nurse/RN Notified 07/04/21  ?Time Charge Nurse/RN Notified [phone]  ?Notify: Provider  ?Provider Name/Title P. Candiss Norse MD  ?Date Provider Notified 07/04/21  ?Time Provider Notified 0800  ?Notification Type Page  ?Notification Reason Other (Comment) ?(Yellow MEWS d/t low SBP)  ?Provider response See new orders  ?Date of Provider Response 07/04/21  ?Time of Provider Response 0800  ?Document  ?Patient Outcome Other (Comment) ?(Remains appropriate for Department.)  ? ? ?

## 2021-07-04 NOTE — Progress Notes (Signed)
Inpatient Diabetes Program Recommendations ? ?AACE/ADA: New Consensus Statement on Inpatient Glycemic Control (2015) ? ?Target Ranges:  Prepandial:   less than 140 mg/dL ?     Peak postprandial:   less than 180 mg/dL (1-2 hours) ?     Critically ill patients:  140 - 180 mg/dL  ? ?Lab Results  ?Component Value Date  ? GLUCAP 257 (H) 07/04/2021  ? HGBA1C 10.4 (H) 07/03/2021  ? ? ?Review of Glycemic Control ? Latest Reference Range & Units 07/03/21 13:15 07/03/21 16:14 07/03/21 19:30 07/03/21 23:37 07/04/21 03:41 07/04/21 07:51 07/04/21 12:00  ?Glucose-Capillary 70 - 99 mg/dL 409 (H) 811 (H) 914 (H) 165 (H) 138 (H) 123 (H) 257 (H)  ? ?Diabetes history: DM 2 ?Outpatient Diabetes medications:  ?Humalog 3 units tid with meals, Metformin 1000 mg bid ?Basaglar 28 units daily ?Current orders for Inpatient glycemic control:  ?Novolog sensitive q 4 hours ? ?Inpatient Diabetes Program Recommendations:   ? ?May consider changing Novolog correction to tid with meals and HS scale.  ?Also consider adding Semglee 10 units daily and Novolog 2 units tid with meals (hold if patient eats less than 50% or NPO).  ? ?Thanks,  ?Beryl Meager, RN, BC-ADM ?Inpatient Diabetes Coordinator ?Pager [phone]  (8a-5p) ? ? ? ?

## 2021-07-04 NOTE — Progress Notes (Signed)
?      ?                 PROGRESS NOTE ? ?      ?PATIENT DETAILS ?Name: Charles [name] ?Age: 61 y.o. ?Sex: male ?Date of Birth: Jun 22, 1960 ?Admit Date: 06/30/2021 ?Admitting Physician Vianne Bulls, MD ?[id number], [name], MD ? ?Brief Summary: ?Patient is a 61 y.o. [place] male with a history of chronic indwelling Foley catheter, DM-2, CAD s/p CABG, HLD-who presented to the hospital with acute metabolic encephalopathy due to sepsis from complicated UTI.  See below for further details. ? ?Significant events: ?3/25-3/27>> hospitalization for hypoglycemia/encephalopathy-refused SNF. ?4/20>> found at home by home health-lethargic/confused-hypotensive-hypothermic.  Admit to [facility]. ? ?Significant studies: ?4/20>> CXR: No PNA ?4/20>> renal ultrasound: Trace right perinephric fluid, echogenic debris in the distended bladder. ? ?Significant microbiology data: ?4/20>> COVID/influenza PCR: Negative ?4/20>> blood culture: No growth ?4/20>> urine culture: Pending ? ?Procedures: ? ?CT Head - premature ventriculomegaly, generalized atrophy. ? ?CT chest abdomen pelvis -  1. Severe hepatic steatosis. 2. Small volume of ascites and diffuse mesenteric edema. 3. Small bilateral pleural effusions lying dependently with some passive subsegmental atelectasis in the lower lobes of the lungs bilaterally. 4. Aortic atherosclerosis, in addition to left main and three-vessel coronary artery disease. Status post median sternotomy for CABG including LIMA to the LAD. ? ?MRI brain - No significant interval change. Age-advanced parenchymal atrophy with ventriculomegaly. 5 mm third ventricular colloid cyst. 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 Mild (but age-advanced) chronic small-vessel ischemic changes within the cerebral white matter. ? ?Consults: None  ? ?Subjective:  Patient in bed, appears comfortable, denies any headache, no fever, no chest pain or pressure, no shortness of breath , no abdominal pain. No new  focal weakness. ? ? ? ?Objective:  ? ?Vitals: ?Blood pressure (!) 86/58, pulse 67, temperature (!) 97.5 ?F (36.4 ?C), temperature source Oral, resp. rate 13, height 5\' 6"  (1.676 m), weight 58.3 kg, SpO2 98 %.  ? ?Exam: ? ?Awake but appears extremely weak and dehydrated, more alert 07/03/21, no neck stiffness, negative Kernig's and Brudzinski's ?[place].AT,PERRAL ?Supple Neck, No JVD,   ?Symmetrical Chest wall movement, Good air movement bilaterally, CTAB ?RRR,No Gallops, Rubs or new Murmurs,  ?+ve B.Sounds, Abd Soft, No tenderness,   ?No Cyanosis, Clubbing or edema  ? ? ? ?Assessment/Plan: ? ?Sepsis due to complicated UTI: In a patient with indwelling Foley catheter who missed his urology outpatient appointment, urine appears clear, Foley being flushed, continue empiric IV antibiotics along with aggressive hydration.  Sepsis pathophysiology seems to have considerably improved.  Will monitor closely. ? ?Acute metabolic encephalopathy: Due to sepsis-severe dehydration, CT head shows signs of advanced vascular dementia, noted non acute MRI, EEG, TSH, RPR, B12, HIV, abnormal lipid panel, A1c, pending folate, acute hepatitis panel, no headache, negative Kernig's and Brezinski sign. Continue PT, OT and speech to monitor as well.  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  Placed him on aspirin and statin, will most likely require placement. ? ?Anion gap metabolic acidosis: Suspect due to combination of sepsis/hypotension/mild renal insufficiency/starvation ketoacidosis/metformin.  Resolved after IV bicarb and supportive care will monitor ? ?Hypokalemia/hypomagnesemia/hypophosphatemia: Aggressively replaced will monitor. ? ?CAD s/p CABG: No obvious anginal symptoms. ? ?Hypotension due to combination of dehydration and sepsis.  Hydrated, add midodrine, check TSH and cortisol.  TED stockings  ? ?HLD: Restart Crestor monitor. ? ?BPH/bladder outflow obstruction with chronic indwelling  Foley catheter: Per H&P-patient did not follow and missed his appointment with his urologist recently.  Will need urology follow-up was discharge. ? ?Insulin-dependent DM-2 (A1c 10.16 April 2021): CBG stable-currently stable on just SSI.  Metformin/Lantus on hold-Will monitor CBGs based on his oral intake and adjust. ? ?Recent Labs  ?  07/03/21 ?2337 07/04/21 ?[id number] 07/04/21 ?0751  ?GLUCAP 165* 138* 123*  ?  ? ? ?BMI: ?Estimated body mass index is 20.74 kg/m? as calculated from the following: ?  Height as of this encounter: 5\' 6"  (1.676 m). ?  Weight as of this encounter: 58.3 kg.  ? ?Code status: ?  Code Status: Full Code  ? ?DVT Prophylaxis: ?heparin injection 5,000 Units Start: 06/30/21 2230 ?  ?Family Communication: Sister [name]-[phone]-updated over the phone on 07/02/21, 07/03/21, 07/04/21 ? ? ?Disposition Plan: ?Status is: Inpatient ?Remains inpatient appropriate because: Sepsis from complicated UTI-encephalopathy-still very confused-sepsis physiology not yet resolved.  Not stable for discharge. ?  ?Planned Discharge Destination:Skilled nursing facility ? ? ?Diet: ?Diet Order   ? ?       ?  DIET DYS 3 Room service appropriate? Yes; Fluid consistency: Thin  Diet effective now       ?  ? ?  ?  ? ?  ?  ? ? ?Antimicrobial agents: ?Anti-infectives (From admission, onward)  ? ? Start     Dose/Rate Route Frequency Ordered Stop  ? 07/02/21 1400  ceFEPIme (MAXIPIME) 2 g in sodium chloride 0.9 % 100 mL IVPB       ? 2 g ?200 mL/hr over 30 Minutes Intravenous Every 8 hours 07/02/21 0759    ? 07/01/21 1700  vancomycin (VANCOCIN) IVPB 1000 mg/200 mL premix  Status:  Discontinued       ? 1,000 mg ?200 mL/hr over 60 Minutes Intravenous Every 24 hours 06/30/21 1813 06/30/21 2226  ? 07/01/21 0600  ceFEPIme (MAXIPIME) 2 g in sodium chloride 0.9 % 100 mL IVPB  Status:  Discontinued       ? 2 g ?200 mL/hr over 30 Minutes Intravenous Every 12 hours 06/30/21 1813 07/02/21 0759  ? 06/30/21 1645  metroNIDAZOLE (FLAGYL) IVPB 500 mg        ? 500 mg ?100 mL/hr over 60 Minutes Intravenous  Once 06/30/21 1638 06/30/21 1818  ? 06/30/21 1645  ceFEPIme (MAXIPIME) 2 g in sodium chloride 0.9 % 100 mL IVPB       ? 2 g ?200 mL/hr over 30 Minutes Intravenous  Once 06/30/21 1643 06/30/21 1736  ? 06/30/21 1645  vancomycin (VANCOREADY) IVPB 1250 mg/250 mL       ? 1,250 mg ?166.7 mL/hr over 90 Minutes Intravenous  Once 06/30/21 1643 06/30/21 1926  ? ?  ? ? ? ?MEDICATIONS: ?Scheduled Meds: ? (feeding supplement) PROSource Plus  30 mL Oral BID BM  ? aspirin EC  81 mg Oral Daily  ? Chlorhexidine Gluconate Cloth  6 each Topical Daily  ? heparin  5,000 Units Subcutaneous Q8H  ? insulin aspart  0-9 Units Subcutaneous Q4H  ? lactose free nutrition  237 mL Oral TID WC  ? midodrine  10 mg Oral TID WC  ? naphazoline-glycerin  2 drop Both Eyes TID  ? sodium chloride flush  3 mL Intravenous Q12H  ? ?Continuous Infusions: ? ceFEPime (MAXIPIME) IV 2 g (07/04/21 0548)  ? dextrose 5 % with KCl 20 mEq / L 20 mEq (07/03/21 2341)  ? lactated ringers    ? magnesium sulfate bolus IVPB 4 g (07/04/21  0644)  ? ?PRN Meds:.acetaminophen **OR** acetaminophen ? ? ?I have personally reviewed following labs and imaging studies ? ?LABORATORY DATA: ? ?Recent Labs  ?Lab 06/30/21 ?1637 07/01/21 ?0213 07/02/21 ?0246 07/03/21 ?0152 07/04/21 ?0117  ?WBC 6.6 6.1 10.4 10.4 9.6  ?HGB 12.6* 11.2* 11.2* 11.2* 10.6*  ?HCT 34.7* 30.5* 29.9* 31.9* 30.0*  ?PLT 179 164 124* 106* 100*  ?MCV 83.8 81.8 82.6 85.1 85.0  ?MCH 30.4 30.0 30.9 29.9 30.0  ?MCHC 36.3* 36.7* 37.5* 35.1 35.3  ?RDW 15.1 15.0 15.5 15.6* 15.1  ?LYMPHSABS 0.6*  --   --  0.8 0.7  ?MONOABS 0.8  --   --  1.1* 1.0  ?EOSABS 0.0  --   --  0.0 0.1  ?BASOSABS 0.0  --   --  0.0 0.0  ? ? ?Recent Labs  ?Lab 06/30/21 ?1637 06/30/21 ?1722 06/30/21 ?2337 07/01/21 ?[id number] 07/01/21 ?1024 07/02/21 ?0246 07/03/21 ?0142 07/03/21 ?0152 07/03/21 ?1332 07/04/21 ?0117  ?NA 139  --    < > 139 140 138  --  137  --  135  ?K 3.4*  --    < > 3.3* 3.5 2.7*  --  3.8  --   4.1  ?CL 106  --    < > 110 108 100  --  103  --  106  ?CO2 13*  --    < > 11* 14* 24  --  25  --  24  ?GLUCOSE 273*  --    < > 133* 141* 94  --  107*  --  141*  ?BUN 28*  --    < > 21* 20 11  --  7  -

## 2021-07-04 NOTE — Progress Notes (Signed)
Speech Language Pathology Treatment: Dysphagia  ?Patient Details ?Name: Charles [name] ?MRN: [id number] ?DOB: 1960-07-05 ?Today's Date: 07/04/2021 ?Time: 8099-8338 ?SLP Time Calculation (min) (ACUTE ONLY): 14 min ? ?Assessment / Plan / Recommendation ?Clinical Impression ? Pt encountered eating breakfast and needing assist to feed due to right UE weakness and cognitively slower processing and sometimes distracted. There were no s/s aspiration with straw sips thin or mechanical soft texture (cut up pancakes and sausage). Rotary and timely mastication without oral residue each time checked. He attempted to self feed and was more successful when using left hand and when attention was sustained to meal tray and needed verbal cues throughout for this. Given the aforementioned, recommend continue Dys 3 texture, thin liquids, straw sips and thin with pills. Prognosis is good for upgraded texture when able to sustain attention, self feeding. ST will see for appropriateness to move to regular.  ?  ?HPI HPI: Patient is a 61 y.o. male with PMH: insulin-dependent DM, CAD, urinary retention with indwelling catheter who presented to the ED with lethargy and confusion. His sister had spoken with him on the phone in AM day before admission and he told her he was tired and jus wanted to sleep but on day of admission he was found by his home health therapist to be lethargic and confused. EMS was called and patient found to have BP of 76/38. Upon arrival in ED, patient's rectal temperature was 89 degrees F and systolic pressure was as low as 78. EKG features sinus bradycardia with incomplete LBBB. CXR was negative for acute cardiopulmonary disease, UA concerning for infection. ?  ?   ?SLP Plan ? Continue with current plan of care ? ?  ?  ?Recommendations for follow up therapy are one component of a multi-disciplinary discharge planning process, led by the attending physician.  Recommendations may be updated based on patient status,  additional functional criteria and insurance authorization. ?  ? ?Recommendations  ?Diet recommendations: Dysphagia 3 (mechanical soft);Thin liquid ?Liquids provided via: Cup;Straw ?Medication Administration: Whole meds with liquid ?Supervision: Staff to assist with self feeding;Intermittent supervision to cue for compensatory strategies ?Compensations: Minimize environmental distractions;Slow rate;Small sips/bites ?Postural Changes and/or Swallow Maneuvers: Seated upright 90 degrees  ?   ?    ?   ? ? ? ? Oral Care Recommendations: Oral care BID ?Follow Up Recommendations: No SLP follow up ?Assistance recommended at discharge: Intermittent Supervision/Assistance ?SLP Visit Diagnosis: Dysphagia, unspecified (R13.10) ?Plan: Continue with current plan of care ? ? ? ? ?  ?  ? ? ?Royce Macadamia ? ?07/04/2021, 10:38 AM ?

## 2021-07-05 DIAGNOSIS — N390 Urinary tract infection, site not specified: Secondary | ICD-10-CM | POA: Diagnosis not present

## 2021-07-05 DIAGNOSIS — A419 Sepsis, unspecified organism: Secondary | ICD-10-CM | POA: Diagnosis not present

## 2021-07-05 LAB — CBC WITH DIFFERENTIAL/PLATELET
Abs Immature Granulocytes: 0.07 10*3/uL (ref 0.00–0.07)
Basophils Absolute: 0 10*3/uL (ref 0.0–0.1)
Basophils Relative: 0 %
Eosinophils Absolute: 0.1 10*3/uL (ref 0.0–0.5)
Eosinophils Relative: 1 %
HCT: 31.9 % — ABNORMAL LOW (ref 39.0–52.0)
Hemoglobin: 11.1 g/dL — ABNORMAL LOW (ref 13.0–17.0)
Immature Granulocytes: 1 %
Lymphocytes Relative: 11 %
Lymphs Abs: 1 10*3/uL (ref 0.7–4.0)
MCH: 30.1 pg (ref 26.0–34.0)
MCHC: 34.8 g/dL (ref 30.0–36.0)
MCV: 86.4 fL (ref 80.0–100.0)
Monocytes Absolute: 1 10*3/uL (ref 0.1–1.0)
Monocytes Relative: 11 %
Neutro Abs: 6.4 10*3/uL (ref 1.7–7.7)
Neutrophils Relative %: 76 %
Platelets: 107 10*3/uL — ABNORMAL LOW (ref 150–400)
RBC: 3.69 MIL/uL — ABNORMAL LOW (ref 4.22–5.81)
RDW: 15.4 % (ref 11.5–15.5)
WBC: 8.6 10*3/uL (ref 4.0–10.5)
nRBC: 0 % (ref 0.0–0.2)

## 2021-07-05 LAB — COMPREHENSIVE METABOLIC PANEL
ALT: 27 U/L (ref 0–44)
AST: 34 U/L (ref 15–41)
Albumin: 1.9 g/dL — ABNORMAL LOW (ref 3.5–5.0)
Alkaline Phosphatase: 141 U/L — ABNORMAL HIGH (ref 38–126)
Anion gap: 6 (ref 5–15)
BUN: 8 mg/dL (ref 6–20)
CO2: 22 mmol/L (ref 22–32)
Calcium: 8.5 mg/dL — ABNORMAL LOW (ref 8.9–10.3)
Chloride: 110 mmol/L (ref 98–111)
Creatinine, Ser: 0.73 mg/dL (ref 0.61–1.24)
GFR, Estimated: >60 mL/min (ref >60)
Glucose, Bld: 97 mg/dL (ref 70–99)
Potassium: 3.8 mmol/L (ref 3.5–5.1)
Sodium: 138 mmol/L (ref 135–145)
Total Bilirubin: 0.1 mg/dL — ABNORMAL LOW (ref 0.3–1.2)
Total Protein: 5.2 g/dL — ABNORMAL LOW (ref 6.5–8.1)

## 2021-07-05 LAB — CULTURE, BLOOD (ROUTINE X 2)
Culture: NO GROWTH
Culture: NO GROWTH

## 2021-07-05 LAB — GLUCOSE, CAPILLARY
Glucose-Capillary: 125 mg/dL — ABNORMAL HIGH (ref 70–99)
Glucose-Capillary: 117 mg/dL — ABNORMAL HIGH (ref 70–99)
Glucose-Capillary: 188 mg/dL — ABNORMAL HIGH (ref 70–99)
Glucose-Capillary: 362 mg/dL — ABNORMAL HIGH (ref 70–99)
Glucose-Capillary: 191 mg/dL — ABNORMAL HIGH (ref 70–99)

## 2021-07-05 LAB — MAGNESIUM: Magnesium: 1.7 mg/dL (ref 1.7–2.4)

## 2021-07-05 LAB — PHOSPHORUS: Phosphorus: 3.2 mg/dL (ref 2.5–4.6)

## 2021-07-05 LAB — PROCALCITONIN: Procalcitonin: <0.1 ng/mL

## 2021-07-05 LAB — HIV ANTIBODY (ROUTINE TESTING W REFLEX): HIV Screen 4th Generation wRfx: NONREACTIVE

## 2021-07-05 LAB — C-REACTIVE PROTEIN: CRP: 12.8 mg/dL — ABNORMAL HIGH (ref <1.0)

## 2021-07-05 LAB — BRAIN NATRIURETIC PEPTIDE: B Natriuretic Peptide: 138 pg/mL — ABNORMAL HIGH (ref 0.0–100.0)

## 2021-07-05 MED ORDER — INSULIN GLARGINE-YFGN 100 UNIT/ML ~~LOC~~ SOLN
8.0000 [IU] | Freq: Every day | SUBCUTANEOUS | Status: DC
  Administered 2021-07-05 – 2021-07-07 (×3): 8 [IU] via SUBCUTANEOUS
  Filled 2021-07-05 (×3): qty 0.08

## 2021-07-05 MED ORDER — INSULIN ASPART 100 UNIT/ML IJ SOLN
0.0000 [IU] | Freq: Three times a day (TID) | INTRAMUSCULAR | Status: DC
  Administered 2021-07-05: 9 [IU] via SUBCUTANEOUS
  Administered 2021-07-06: 3 [IU] via SUBCUTANEOUS
  Administered 2021-07-06: 2 [IU] via SUBCUTANEOUS
  Administered 2021-07-06: 9 [IU] via SUBCUTANEOUS
  Administered 2021-07-07 (×2): 2 [IU] via SUBCUTANEOUS

## 2021-07-05 MED ORDER — PANTOPRAZOLE SODIUM 40 MG PO TBEC
40.0000 mg | DELAYED_RELEASE_TABLET | Freq: Every day | ORAL | Status: DC
  Administered 2021-07-05 – 2021-07-07 (×3): 40 mg via ORAL
  Filled 2021-07-05 (×3): qty 1

## 2021-07-05 MED ORDER — MAGNESIUM SULFATE 4 GM/100ML IV SOLN
4.0000 g | Freq: Once | INTRAVENOUS | Status: AC
  Administered 2021-07-05: 4 g via INTRAVENOUS
  Filled 2021-07-05: qty 100

## 2021-07-05 MED ORDER — TAMSULOSIN HCL 0.4 MG PO CAPS
0.4000 mg | ORAL_CAPSULE | Freq: Every day | ORAL | Status: DC
  Administered 2021-07-05 – 2021-07-07 (×3): 0.4 mg via ORAL
  Filled 2021-07-05 (×3): qty 1

## 2021-07-05 NOTE — Progress Notes (Signed)
Pharmacy Antibiotic Note ? ?Charles [name] is a 61 y.o. male admitted on 06/30/2021 with sepsis and UTI.  Pharmacy has been consulted for cefepime dosing. ? ?Patient with history of indwelling catheter presented 4/20 with lethargy, confusion, hypotension, and hypothermia. UA >50k WBC and few bacteria. SCr 1.22 upon admission, now 0.91 with a CrCl of 68.5 ml/min. Will adjust cefepime dose for improved renal function.  ? ?Plan: ?Continue cefepime 2g IV q8h ?Consider de-escalate abx soon ?Will continue to f/u renal function and pt's clinical condition ? ?Height: 5\' 6"  (167.6 cm) ?Weight: 58 kg (127 lb 13.9 oz) ?IBW/kg (Calculated) : 63.8 ? ?Temp (24hrs), Avg:97.9 ?F (36.6 ?C), Min:97.4 ?F (36.3 ?C), Max:98.3 ?F (36.8 ?C) ? ?Recent Labs  ?Lab 06/30/21 ?1637 06/30/21 ?2337 07/01/21 ?[id number] 07/01/21 ?1024 07/02/21 ?0246 07/03/21 ?0152 07/04/21 ?[id number] 07/05/21 ?0124  ?WBC 6.6  --  6.1  --  10.4 10.4 9.6 8.6  ?CREATININE 1.22   < > 1.26* 1.24 0.91 0.79 0.69 0.73  ?LATICACIDVEN 1.6  --   --   --   --   --   --   --   ? < > = values in this interval not displayed.  ? ?  ?Estimated Creatinine Clearance: 82.6 mL/min (by C-G formula based on SCr of 0.73 mg/dL).   ? ?No Known Allergies ? ?Antimicrobials this admission: ?Metronidazole 4/20 x1 ?Vancomycin 4/20 x1 ?Cefepime 4/20 >> ? ?Microbiology results: ?4/20 BCx: neg ?4/20 Ucx: multiple species ? ?Thank you for allowing pharmacy to be a part of this patient?s care. ? ?Sherlon Handing, PharmD, BCPS ?Please see amion for complete clinical pharmacist phone list ?07/05/2021 3:24 PM ? ?

## 2021-07-05 NOTE — Progress Notes (Signed)
?      ?                 PROGRESS NOTE ? ?      ?PATIENT DETAILS ?Name: Charles [name] ?Age: 61 y.o. ?Sex: male ?Date of Birth: Jan 30, 1961 ?Admit Date: 06/30/2021 ?Admitting Physician Vianne Bulls, MD ?[id number], [name], MD ? ?Brief Summary: ?Patient is a 61 y.o. [place] male with a history of chronic indwelling Foley catheter, DM-2, CAD s/p CABG, HLD-who presented to the hospital with acute metabolic encephalopathy due to sepsis from complicated UTI.  See below for further details. ? ?Significant events: ?3/25-3/27>> hospitalization for hypoglycemia/encephalopathy-refused SNF. ?4/20>> found at home by home health-lethargic/confused-hypotensive-hypothermic.  Admit to [facility]. ? ?Significant studies: ?4/20>> CXR: No PNA ?4/20>> renal ultrasound: Trace right perinephric fluid, echogenic debris in the distended bladder. ? ?Significant microbiology data: ?4/20>> COVID/influenza PCR: Negative ?4/20>> blood culture: No growth ?4/20>> urine culture: Pending ? ?Procedures: ? ?CT Head - premature ventriculomegaly, generalized atrophy. ? ?CT chest abdomen pelvis -  1. Severe hepatic steatosis. 2. Small volume of ascites and diffuse mesenteric edema. 3. Small bilateral pleural effusions lying dependently with some passive subsegmental atelectasis in the lower lobes of the lungs bilaterally. 4. Aortic atherosclerosis, in addition to left main and three-vessel coronary artery disease. Status post median sternotomy for CABG including LIMA to the LAD. ? ?MRI brain - No significant interval change. Age-advanced parenchymal atrophy with ventriculomegaly. 5 mm third ventricular colloid cyst. 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 Mild (but age-advanced) chronic small-vessel ischemic changes within the cerebral white matter. ? ?Consults: Urology ? ?Subjective:  Patient in bed, appears comfortable, denies any headache, no fever, no chest pain or pressure, no shortness of breath , no abdominal pain. No  new focal weakness. ? ? ?Objective:  ? ?Vitals: ?Blood pressure 132/78, pulse 67, temperature 98.2 ?F (36.8 ?C), temperature source Oral, resp. rate (!) 24, height 5\' 6"  (1.676 m), weight 58 kg, SpO2 98 %.  ? ?Exam: ? ?Awake Alert, No new F.N deficits, Normal affect ?[place].AT,PERRAL ?Supple Neck, No JVD,   ?Symmetrical Chest wall movement, Good air movement bilaterally, CTAB ?RRR,No Gallops, Rubs or new Murmurs,  ?+ve B.Sounds, Abd Soft, No tenderness,   ?No Cyanosis, Clubbing or edema  ? ? ? ? ?Assessment/Plan: ? ?Sepsis due to complicated UTI: In a patient with indwelling Foley catheter who missed his urology outpatient appointment, urine appears clear, Foley being flushed, continue empiric IV antibiotics along with aggressive hydration.  Sepsis pathophysiology seems to have considerably improved.  Will monitor closely.  Blood cultures have stayed negative. ? ?Acute metabolic encephalopathy: Due to sepsis-severe dehydration, CT head shows signs of advanced vascular dementia, noted non acute MRI, EEG, TSH, RPR, B12, HIV, abnormal lipid panel, A1c, folate,  pending acute hepatitis panel, no headache, negative Kernig's and Brezinski sign. Continue PT, OT and speech to monitor as well.  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  Placed him on aspirin and statin, will most likely require placement to SNF ? ?Anion gap metabolic acidosis: Suspect due to combination of sepsis/hypotension/mild renal insufficiency/starvation ketoacidosis/metformin.  Resolved after IV bicarb and supportive care will monitor ? ?Hypokalemia/hypomagnesemia/hypophosphatemia: Aggressively replaced will monitor. ? ?CAD s/p CABG: No obvious anginal symptoms. ? ?Hypotension due to combination of dehydration and sepsis.  Hydrated, add midodrine, check TSH and cortisol.  TED stockings  ? ?HLD: Restart Crestor monitor. ? 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  Continue on Flomax, has a coud? catheter for now draining urine around it, will discontinue coud? catheter discussed with Dr. Louis Meckel who will come and see the patient later on 07/05/2021 and reevaluate catheter needed. ? ?Insulin-dependent DM-2 (A1c 10.16 April 2021): With improving mentation his oral intake has improved low-dose Lantus and sliding scale QA CHS, poor outpatient control due to hyperglycemia. ? ?Recent Labs  ?  07/04/21 ?2348 07/05/21 ?0423 07/05/21 ?0827  ?GLUCAP 138* 125* 117*  ?  ? ? ?BMI: ?Estimated body mass index is 20.64 kg/m? as calculated from the following: ?  Height as of this encounter: 5\' 6"  (1.676 m). ?  Weight as of this encounter: 58 kg.  ? ?Code status: ?  Code Status: Full Code  ? ?DVT Prophylaxis: ?Place TED hose Start: 07/04/21 0842 ?heparin injection 5,000 Units Start: 06/30/21 2230 ?  ?Family Communication:  ? ?Sister [name]-[phone]-updated over the phone on 07/02/21, 07/03/21, 07/04/21 ? ? ?Disposition Plan: ?Status is: Inpatient ?Remains inpatient appropriate because: Sepsis from complicated UTI-encephalopathy-still very confused-sepsis physiology not yet resolved.  Not stable for discharge. ?  ?Planned Discharge Destination:Skilled nursing facility ? ? ?Diet: ?Diet Order   ? ?       ?  DIET DYS 3 Room service appropriate? Yes; Fluid consistency: Thin  Diet effective now       ?  ? ?  ?  ? ?  ?  ? ?MEDICATIONS: ?Scheduled Meds: ? (feeding supplement) PROSource Plus  30 mL Oral BID BM  ? aspirin EC  81 mg Oral Daily  ? Chlorhexidine Gluconate Cloth  6 each Topical Daily  ? heparin  5,000 Units Subcutaneous Q8H  ? insulin aspart  0-9 Units Subcutaneous TID WC  ? insulin glargine-yfgn  8 Units Subcutaneous Daily  ? lactose free nutrition  237 mL Oral TID WC  ? mouth rinse  15 mL Mouth Rinse BID  ? midodrine  10 mg Oral TID WC  ? naphazoline-glycerin  2 drop Both Eyes TID  ? pantoprazole  40 mg Oral Daily  ?  rosuvastatin  10 mg Oral Daily  ? sodium chloride flush  3 mL Intravenous Q12H  ? tamsulosin  0.4 mg Oral Daily  ? ?Continuous Infusions: ? sodium chloride Stopped (07/04/21 1613)  ? ceFEPime (MAXIPIME) IV 2 g (07/05/21 0524)  ? ?PRN Meds:.sodium chloride, acetaminophen **OR** acetaminophen ? ? ?I have personally reviewed following labs and imaging studies ? ?LABORATORY DATA: ? ?Recent Labs  ?Lab 06/30/21 ?1637 07/01/21 ?0213 07/02/21 ?0246 07/03/21 ?0152 07/04/21 ?[id number] 07/05/21 ?0124  ?WBC 6.6 6.1 10.4 10.4 9.6 8.6  ?HGB 12.6* 11.2* 11.2* 11.2* 10.6* 11.1*  ?HCT 34.7* 30.5* 29.9* 31.9* 30.0* 31.9*  ?PLT 179 164 124* 106* 100* 107*  ?MCV 83.8 81.8 82.6 85.1 85.0 86.4  ?MCH 30.4 30.0 30.9 29.9 30.0 30.1  ?MCHC 36.3* 36.7* 37.5* 35.1 35.3 34.8  ?RDW 15.1 15.0 15.5 15.6* 15.1 15.4  ?LYMPHSABS 0.6*  --   --  0.8 0.7 1.0  ?MONOABS 0.8  --   --  1.1* 1.0 1.0  ?EOSABS 0.0  --   --  0.0 0.1 0.1  ?BASOSABS 0.0  --   --  0.0 0.0 0.0  ? ? ?Recent Labs  ?Lab 06/30/21 ?1637 06/30/21 ?1722 06/30/21 ?2337 07/01/21 ?[id number] 07/01/21 ?1024 07/02/21 ?0246 07/03/21 ?0142 07/03/21 ?0152 07/03/21 ?1332 07/04/21 ?[id number] 07/05/21 ?0124  ?NA 139  --    < >  139 140 138  --  137  --  135 138  ?K 3.4*  --    < > 3.3* 3.5 2.7*  --  3.8  --  4.1 3.8  ?CL 106  --    < > 110 108 100  --  103  --  106 110  ?CO2 13*  --    < > 11* 14* 24  --  25  --  24 22  ?GLUCOSE 273*  --    < > 133* 141* 94  --  107*  --  141* 97  ?BUN 28*  --    < > 21* 20 11  --  7  --  5* 8  ?CREATININE 1.22  --    < > 1.26* 1.24 0.91  --  0.79  --  0.69 0.73  ?CALCIUM 8.9  --    < > 8.4* 8.1* 7.4*  --  7.9*  --  8.4* 8.5*  ?AST 16  --   --  17  --  27  --  24  --  21 34  ?ALT 17  --   --  17  --  20  --  19  --  18 27  ?ALKPHOS 121  --   --  100  --  96  --  90  --  104 141*  ?BILITOT 1.0  --   --  1.8*  --  1.0  --  0.9  --  0.1* 0.1*  ?ALBUMIN 2.9*  --   --  2.3*  --  2.1*  --  1.9*  --  1.8* 1.9*  ?MG  --   --    < > 1.1* 2.2 1.5*  --  1.8  --  1.5* 1.7  ?CRP  --   --   --    --   --   --   --  16.9*  --  16.7* 12.8*  ?PROCALCITON  --   --   --   --   --  <0.10  --  <0.10  --  <0.10 <0.10  ?LATICACIDVEN 1.6  --   --   --   --   --   --   --   --   --   --   ?INR  --  1.1  --

## 2021-07-05 NOTE — Progress Notes (Signed)
Dr. Marlou Porch at bedside.  MD inserted new foley catheter due to retention.  Bladder scan prior to foley insertion shows . ?

## 2021-07-05 NOTE — Consult Note (Signed)
I have been asked to see the patient by Dr. Lala Lund, for evaluation and management of Foley catheter management. ? ?History of present illness: ?61 year old poorly controlled diabetic with significant peripheral manifestations and a history of urinary retention presented to the hospital found down at home.  He was resuscitated and found to have urosepsis and dehydration.  He has been gradually making progress over the course of the last several days.  Today the Foley catheter was now draining very well, leaking all around it, and as such urology was consulted.  Because the catheter appeared not to be functioning I recommended that they take it out early and see if we could do a quasi voiding trial.  At the time of my encounter the patient had voided 900 cc on his own, seemingly all at once.  We subsequently BladderScan him and he had 850 remaining in his bladder. ? ?The patient has been seen and managed by urology in [place].  He recently had cystoscopic evaluation to assess for obstruction.  His prostate was noted to be relatively nonobstructing.  He was set up for urodynamics to evaluate his bladder function.  He has failed numerous voiding trials.  His last catheter was exchanged on 3/20. ? ? ? ?Review of systems: A 12 point comprehensive review of systems was obtained and is negative unless otherwise stated in the history of present illness. ? ?Patient Active Problem List  ? Diagnosis Date Noted  ? Pressure injury of skin 07/02/2021  ? Sepsis secondary to UTI ([place]) 06/30/2021  ? Hypothermia 06/30/2021  ? Metabolic acidosis 88/28/0034  ? AKI (acute kidney injury) ([place]) 06/30/2021  ? Acute encephalopathy 06/04/2021  ? Diarrhea 04/24/2021  ? Urinary retention 04/23/2021  ? Nausea & vomiting 04/22/2021  ? Dehydration 04/22/2021  ? Acute prerenal azotemia 04/22/2021  ? Leukocytosis 04/22/2021  ? HLD (hyperlipidemia) 04/22/2021  ? Metabolic encephalopathy 91/79/1505  ? DKA (diabetic ketoacidosis) ([place])  04/21/2021  ? 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/23/2021  ? Type 2 diabetes mellitus with complication, with long-term current use of insulin ([place]) 03/23/2021  ? BPH with obstruction/lower urinary tract symptoms 02/08/2021  ? Phimosis 01/27/2021  ? Hypertension 12/23/2020  ? Coronary artery disease involving native coronary artery of native heart without angina pectoris 12/23/2020  ? ? ?No current facility-administered medications on file prior to encounter.  ? ?Current Outpatient Medications on File Prior to Encounter  ?Medication Sig Dispense Refill  ? aspirin EC 81 MG tablet Take 81 mg by mouth daily. Swallow whole.    ? blood glucose meter kit and supplies Dispense based on patient and insurance preference. Use up to four times daily as directed. (FOR ICD-10 E10.9, E11.9). 1 each 11  ? clotrimazole-betamethasone (LOTRISONE) cream Apply topically 2 (two) times daily. Apply to foreskin (Patient taking differently: Apply 1 application. topically 2 (two) times daily.) 45 g 4  ? Continuous Blood Gluc Sensor (FREESTYLE LIBRE 2 SENSOR) MISC 3 Units by Does not apply route 3 (three) times daily with meals. 1 each 3  ? gabapentin (NEURONTIN) 300 MG capsule Take 1 capsule (300 mg total) by mouth at bedtime. (Patient taking differently: Take 300 mg by mouth daily.) 30 capsule 3  ? HUMALOG KWIKPEN 100 UNIT/ML KwikPen Inject 3 Units into the skin 3 (three) times daily before meals. 15 mL 3  ? Insulin Glargine (BASAGLAR KWIKPEN) 100 UNIT/ML Inject 28 Units into the skin daily. 15 mL 2  ? metFORMIN (GLUCOPHAGE) 1000 MG tablet TAKE 1 TABLET (1,000 MG TOTAL) BY MOUTH  2 (TWO) TIMES DAILY WITH A MEAL. 60 tablet 0  ? oxybutynin (DITROPAN) 5 MG tablet Take 1 tablet (5 mg total) by mouth every 8 (eight) hours as needed for bladder spasms. 90 tablet 1  ? pantoprazole (PROTONIX) 40 MG tablet Take 1 tablet (40 mg total) by mouth daily. Any generic PPI is okay to dispense (Patient taking differently: Take 40 mg by mouth daily.) 30 tablet 1  ? rosuvastatin  (CRESTOR) 40 MG tablet Take 1 tablet (40 mg total) by mouth daily. 30 tablet 3  ? silodosin (RAPAFLO) 8 MG CAPS capsule Take 1 capsule (8 mg total) by mouth daily. 30 capsule 11  ? ? ?Past Medical History:  ?Diagnosis Date  ? Arthritis   ? Diabetes mellitus without complication ([place])   ? Heart disease   ? Hypertension   ? ? ?Past Surgical History:  ?Procedure Laterality Date  ? CORONARY ARTERY BYPASS GRAFT    ? ? ?Social History  ? ?Tobacco Use  ? Smoking status: Never  ? Smokeless tobacco: Never  ?Vaping Use  ? Vaping Use: Never used  ?Substance Use Topics  ? Alcohol use: Not Currently  ?  Comment: none in 6 months may open a bottle of wine tonight [id number]  ? Drug use: Never  ? ? ?Family History  ?Problem Relation Age of Onset  ? Diabetes Mother   ? Hypertension Mother   ? Diabetes Father   ? Hypertension Father   ? Heart attack Father   ? Diabetes Sister   ? Diabetes Sister   ? ? ?PE: ?Vitals:  ? 07/05/21 0600 07/05/21 0746 07/05/21 1136 07/05/21 1557  ?BP: 95/70 132/78 130/79 104/70  ?Pulse: 68 67 61 71  ?Resp: 12 (!) $Remo'24 13 12  'ROlej$ ?Temp:  98.2 ?F (36.8 ?C) 97.7 ?F (36.5 ?C) (!) 97.5 ?F (36.4 ?C)  ?TempSrc:  Oral Oral Oral  ?SpO2:      ?Weight:      ?Height:      ? ?Patient appears to be in no acute distress  ?patient is alert and oriented x3 ?Atraumatic normocephalic head ?No cervical or supraclavicular lymphadenopathy appreciated ?No increased work of breathing, no audible wheezes/rhonchi ?Regular sinus rhythm/rate ?Abdomen is soft, nontender, nondistended, no CVA or suprapubic tenderness ?Patient has an uncircumcised penis and a small sore around the urethral meatus ?Lower extremities are symmetric without appreciable edema ?Grossly neurologically intact ?No identifiable skin lesions ? ?Recent Labs  ?  07/03/21 ?0152 07/04/21 ?9242 07/05/21 ?0124  ?WBC 10.4 9.6 8.6  ?HGB 11.2* 10.6* 11.1*  ?HCT 31.9* 30.0* 31.9*  ? ?Recent Labs  ?  07/03/21 ?0152 07/04/21 ?6834 07/05/21 ?0124  ?NA 137 135 138  ?K 3.8 4.1 3.8   ?CL 103 106 110  ?CO2 $Rem'25 24 22  'GHqx$ ?GLUCOSE 107* 141* 97  ?BUN 7 5* 8  ?CREATININE 0.79 0.69 0.73  ?CALCIUM 7.9* 8.4* 8.5*  ? ?Recent Labs  ?  07/03/21 ?1332  ?INR 1.0  ? ?No results for input(s): LABURIN in the last 72 hours. ?Results for orders placed or performed during the hospital encounter of 06/30/21  ?Urine Culture     Status: Abnormal  ? Collection Time: 06/30/21  4:37 PM  ? Specimen: In/Out Cath Urine  ?Result Value Ref Range Status  ? Specimen Description IN/OUT CATH URINE  Final  ? Special Requests   Final  ?  NONE ?Performed at [facility], [place] [address] [place], [place] [address] ?  ? Culture MULTIPLE SPECIES PRESENT,  SUGGEST RECOLLECTION (A)  Final  ? Report Status 07/02/2021 FINAL  Final  ?Blood Culture (routine x 2)     Status: None  ? Collection Time: 06/30/21  4:45 PM  ? Specimen: BLOOD  ?Result Value Ref Range Status  ? Specimen Description BLOOD RIGHT ANTECUBITAL  Final  ? Special Requests   Final  ?  BOTTLES DRAWN AEROBIC AND ANAEROBIC Blood Culture results may not be optimal due to an inadequate volume of blood received in culture bottles  ? Culture   Final  ?  NO GROWTH 5 DAYS ?Performed at [facility], [place] [address], [place], [place] [address] ?  ? Report Status 07/05/2021 FINAL  Final  ?Blood Culture (routine x 2)     Status: None  ? Collection Time: 06/30/21  4:53 PM  ? Specimen: BLOOD  ?Result Value Ref Range Status  ? Specimen Description BLOOD BLOOD RIGHT WRIST  Final  ? Special Requests   Final  ?  BOTTLES DRAWN AEROBIC AND ANAEROBIC Blood Culture results may not be optimal due to an inadequate volume of blood received in culture bottles  ? Culture   Final  ?  NO GROWTH 5 DAYS ?Performed at [facility], [place] [address] [place], [place] [address] ?  ? Report Status 07/05/2021 FINAL  Final  ?Resp Panel by RT-PCR (Flu A&B, Covid) Nasopharyngeal Swab     Status: None  ? Collection Time: 06/30/21  5:15 PM  ? Specimen: Nasopharyngeal Swab; Nasopharyngeal(NP) swabs in  vial transport medium  ?Result Value Ref Range Status  ? SARS Coronavirus 2 by RT PCR NEGATIVE NEGATIVE Final  ?  Comment: (NOTE) ?SARS-CoV-2 target nucleic acids are NOT DETECTED. ? ?The SARS-CoV-2 RNA is gener

## 2021-07-05 NOTE — TOC Progression Note (Addendum)
Transition of Care (TOC) - Progression Note  ? ? ?Patient Details  ?Name: [name] ?MRN: [id number] ?Date of Birth: [birth year] ? ?Transition of Care (TOC) CM/SW Contact  ?Mearl Latin, LCSW ?Phone Number: ?07/05/2021, 4:09 PM ? ?Clinical Narrative:    ?4pm-CSW left voicemail for Marcelino Duster with [facility] to check status of SNF authorization.  ? ? ?4:37pm-Michelle returned call and stated she received request but is awaiting clinicals from SNF. CSW will fax clinicals to [phone]. ? ?Expected Discharge Plan: Skilled Nursing Facility ?Barriers to Discharge: Continued Medical Work up, English as a second language teacher, SNF Pending bed offer ? ?Expected Discharge Plan and Services ?Expected Discharge Plan: Skilled Nursing Facility ?In-house Referral: Clinical Social Work ?  ?Post Acute Care Choice: Skilled Nursing Facility ?Living arrangements for the past 2 months: Single Family Home ?                ?  ?  ?  ?  ?  ?  ?  ?  ?  ?  ? ? ?Social Determinants of Health (SDOH) Interventions ?  ? ?Readmission Risk Interventions ? ?  07/04/2021  ?  2:49 PM  ?Readmission Risk Prevention Plan  ?Transportation Screening Complete  ?PCP or Specialist Appt within 5-7 Days Complete  ?Home Care Screening Complete  ? ? ?

## 2021-07-05 NOTE — Progress Notes (Signed)
Physical Therapy Treatment ?Patient Details ?Name: Charles [name] ?MRN: [id number] ?DOB: 1960/08/01 ?Today's Date: 07/05/2021 ? ? ?History of Present Illness 61 y/o male presented to ED on 06/30/21 for weakness and confusion with HHPT. Found to be hypotensive and bradycardic with EMS. Admitted for severe sepsis and UTI. Recent admission 3/25-3/27 for hypoglycemic event and unresponsiveness - d/c'ed home despite SNF recommendation. PMH: with medical history significant of HTN, CAD status post CABG, BPH, diabetes ? ?  ?PT Comments  ? ? Patient awake and agreeable to get OOB. Continues with very delayed, slow processing and requires physical facilitation at times to initiate movement. Able to bear weight on legs without buckling, however incr difficulty advancing feet for step-pivot transfer. Facilitated wt-shifting and physically assisted to advance each foot during transfer.  ?   ?Recommendations for follow up therapy are one component of a multi-disciplinary discharge planning process, led by the attending physician.  Recommendations may be updated based on patient status, additional functional criteria and insurance authorization. ? ?Follow Up Recommendations ? Skilled nursing-short term rehab (<3 hours/day) ?  ?  ?Assistance Recommended at Discharge Frequent or constant Supervision/Assistance  ?Patient can return home with the following Two people to help with walking and/or transfers;Two people to help with bathing/dressing/bathroom;Assistance with cooking/housework;Direct supervision/assist for medications management;Direct supervision/assist for financial management;Assist for transportation;Help with stairs or ramp for entrance ?  ?Equipment Recommendations ? Other (comment) (defer to next venue of care)  ?  ?Recommendations for Other Services   ? ? ?  ?Precautions / Restrictions Precautions ?Precautions: Fall ?Precaution Comments: found down ?Restrictions ?Weight Bearing Restrictions: No  ?  ? ?Mobility ? Bed  Mobility ?Overal bed mobility: Needs Assistance ?Bed Mobility: Supine to Sit ?  ?  ?Supine to sit: Max assist, HOB elevated ?  ?  ?  ?  ? ?Transfers ?Overall transfer level: Needs assistance ?Equipment used: 2 person hand held assist ?Transfers: Sit to/from Stand, Bed to chair/wheelchair/BSC ?Sit to Stand: +2 physical assistance, +2 safety/equipment, Mod assist ?  ?Step pivot transfers: Mod assist, +2 physical assistance ?  ?  ?  ?General transfer comment: able to bear weight on bil LEs; difficulty wt-shifting and advancing feet ?  ? ?Ambulation/Gait ?  ?  ?  ?  ?  ?  ?  ?General Gait Details: unable ? ? ?Stairs ?  ?  ?  ?  ?  ? ? ?Wheelchair Mobility ?  ? ?Modified Rankin (Stroke Patients Only) ?  ? ? ?  ?Balance Overall balance assessment: Needs assistance ?Sitting-balance support: Feet supported, No upper extremity supported ?Sitting balance-Leahy Scale: Fair ?  ?  ?Standing balance support: Bilateral upper extremity supported, Reliant on assistive device for balance ?Standing balance-Leahy Scale: Poor ?Standing balance comment: +2 mod ?  ?  ?  ?  ?  ?  ?  ?  ?  ?  ?  ?  ? ?  ?Cognition Arousal/Alertness: Awake/alert ?Behavior During Therapy: Flat affect ?Overall Cognitive Status: Difficult to assess ?Area of Impairment: Attention, Following commands, Safety/judgement, Awareness, Problem solving, Memory ?  ?  ?  ?  ?  ?  ?  ?  ?  ?Current Attention Level: Sustained ?  ?Following Commands: Follows one step commands inconsistently, Follows one step commands with increased time ?Safety/Judgement: Decreased awareness of safety, Decreased awareness of deficits ?Awareness: Intellectual ?Problem Solving: Slow processing, Decreased initiation, Requires verbal cues, Difficulty sequencing, Requires tactile cues ?General Comments: Difficulty sequencing and poor initiation, slowed processing ?  ?  ? ?  ?  Exercises General Exercises - Lower Extremity ?Long Arc Quad: AROM, Both, 10 reps ?Hip Flexion/Marching: AROM, Both, 10  reps ? ?  ?General Comments   ?  ?  ? ?Pertinent Vitals/Pain Pain Assessment ?Pain Assessment: Faces ?Faces Pain Scale: No hurt  ? ? ?Home Living   ?  ?  ?  ?  ?  ?  ?  ?  ?  ?   ?  ?Prior Function    ?  ?  ?   ? ?PT Goals (current goals can now be found in the care plan section) Acute Rehab PT Goals ?Patient Stated Goal: did not state ?Time For Goal Achievement: 07/16/21 ?Potential to Achieve Goals: Fair ?Progress towards PT goals: Progressing toward goals ? ?  ?Frequency ? ? ? Min 2X/week ? ? ? ?  ?PT Plan Current plan remains appropriate  ? ? ?Co-evaluation   ?  ?  ?  ?  ? ?  ?AM-PAC PT "6 Clicks" Mobility   ?Outcome Measure ? Help needed turning from your back to your side while in a flat bed without using bedrails?: Total ?Help needed moving from lying on your back to sitting on the side of a flat bed without using bedrails?: Total ?Help needed moving to and from a bed to a chair (including a wheelchair)?: Total ?Help needed standing up from a chair using your arms (e.g., wheelchair or bedside chair)?: Total ?Help needed to walk in hospital room?: Total ?Help needed climbing 3-5 steps with a railing? : Total ?6 Click Score: 6 ? ?  ?End of Session Equipment Utilized During Treatment: Gait belt ?Activity Tolerance: Patient tolerated treatment well ?Patient left: with call bell/phone within reach;in chair;with chair alarm set ?Nurse Communication: Mobility status ?PT Visit Diagnosis: Unsteadiness on feet (R26.81);Other abnormalities of gait and mobility (R26.89);Muscle weakness (generalized) (M62.81);History of falling (Z91.81);Difficulty in walking, not elsewhere classified (R26.2);Other symptoms and signs involving the nervous system (R29.898);Adult, failure to thrive (R62.7) ?  ? ? ?Time: 0539-7673 ?PT Time Calculation (min) (ACUTE ONLY): 14 min ? ?Charges:  $Therapeutic Activity: 8-22 mins          ?          ? ? ?Jerolyn Center, PT ?Acute Rehabilitation Services  ?Pager [phone] ?Office [phone] ? ? ? ?Scherrie November  Tramar Brueckner ?07/05/2021, 2:33 PM ? ?

## 2021-07-06 DIAGNOSIS — I959 Hypotension, unspecified: Secondary | ICD-10-CM

## 2021-07-06 DIAGNOSIS — A419 Sepsis, unspecified organism: Secondary | ICD-10-CM | POA: Diagnosis not present

## 2021-07-06 DIAGNOSIS — N179 Acute kidney failure, unspecified: Secondary | ICD-10-CM | POA: Diagnosis not present

## 2021-07-06 DIAGNOSIS — G9340 Encephalopathy, unspecified: Secondary | ICD-10-CM | POA: Diagnosis not present

## 2021-07-06 DIAGNOSIS — I2510 Atherosclerotic heart disease of native coronary artery without angina pectoris: Secondary | ICD-10-CM | POA: Diagnosis not present

## 2021-07-06 LAB — CBC WITH DIFFERENTIAL/PLATELET
Abs Immature Granulocytes: 0.06 10*3/uL (ref 0.00–0.07)
Basophils Absolute: 0 10*3/uL (ref 0.0–0.1)
Basophils Relative: 0 %
Eosinophils Absolute: 0.1 10*3/uL (ref 0.0–0.5)
Eosinophils Relative: 1 %
HCT: 30.3 % — ABNORMAL LOW (ref 39.0–52.0)
Hemoglobin: 10.5 g/dL — ABNORMAL LOW (ref 13.0–17.0)
Immature Granulocytes: 1 %
Lymphocytes Relative: 12 %
Lymphs Abs: 0.9 10*3/uL (ref 0.7–4.0)
MCH: 29.9 pg (ref 26.0–34.0)
MCHC: 34.7 g/dL (ref 30.0–36.0)
MCV: 86.3 fL (ref 80.0–100.0)
Monocytes Absolute: 0.8 10*3/uL (ref 0.1–1.0)
Monocytes Relative: 11 %
Neutro Abs: 6 10*3/uL (ref 1.7–7.7)
Neutrophils Relative %: 75 %
Platelets: 93 10*3/uL — ABNORMAL LOW (ref 150–400)
RBC: 3.51 MIL/uL — ABNORMAL LOW (ref 4.22–5.81)
RDW: 14.9 % (ref 11.5–15.5)
WBC: 7.9 10*3/uL (ref 4.0–10.5)
nRBC: 0 % (ref 0.0–0.2)

## 2021-07-06 LAB — COMPREHENSIVE METABOLIC PANEL
ALT: 28 U/L (ref 0–44)
AST: 31 U/L (ref 15–41)
Albumin: 1.9 g/dL — ABNORMAL LOW (ref 3.5–5.0)
Alkaline Phosphatase: 141 U/L — ABNORMAL HIGH (ref 38–126)
Anion gap: 6 (ref 5–15)
BUN: 11 mg/dL (ref 6–20)
CO2: 25 mmol/L (ref 22–32)
Calcium: 8.4 mg/dL — ABNORMAL LOW (ref 8.9–10.3)
Chloride: 105 mmol/L (ref 98–111)
Creatinine, Ser: 0.64 mg/dL (ref 0.61–1.24)
GFR, Estimated: >60 mL/min (ref >60)
Glucose, Bld: 167 mg/dL — ABNORMAL HIGH (ref 70–99)
Potassium: 3.9 mmol/L (ref 3.5–5.1)
Sodium: 136 mmol/L (ref 135–145)
Total Bilirubin: 0.5 mg/dL (ref 0.3–1.2)
Total Protein: 5.1 g/dL — ABNORMAL LOW (ref 6.5–8.1)

## 2021-07-06 LAB — GLUCOSE, CAPILLARY
Glucose-Capillary: 151 mg/dL — ABNORMAL HIGH (ref 70–99)
Glucose-Capillary: 372 mg/dL — ABNORMAL HIGH (ref 70–99)
Glucose-Capillary: 217 mg/dL — ABNORMAL HIGH (ref 70–99)
Glucose-Capillary: 237 mg/dL — ABNORMAL HIGH (ref 70–99)

## 2021-07-06 LAB — HEPATITIS PANEL, ACUTE
HCV Ab: NONREACTIVE
Hep A IgM: NONREACTIVE
Hep B C IgM: NONREACTIVE
Hepatitis B Surface Ag: NONREACTIVE

## 2021-07-06 LAB — VITAMIN B1: Vitamin B1 (Thiamine): 81.4 nmol/L (ref 66.5–200.0)

## 2021-07-06 LAB — C-REACTIVE PROTEIN: CRP: 8.6 mg/dL — ABNORMAL HIGH (ref <1.0)

## 2021-07-06 LAB — PROCALCITONIN: Procalcitonin: <0.1 ng/mL

## 2021-07-06 LAB — BRAIN NATRIURETIC PEPTIDE: B Natriuretic Peptide: 115.1 pg/mL — ABNORMAL HIGH (ref 0.0–100.0)

## 2021-07-06 LAB — MAGNESIUM: Magnesium: 1.8 mg/dL (ref 1.7–2.4)

## 2021-07-06 NOTE — Progress Notes (Signed)
?      ?                 PROGRESS NOTE ? ?      ?PATIENT DETAILS ?Name: Charles [name] ?Age: 61 y.o. ?Sex: male ?Date of Birth: 07/24/1960 ?Admit Date: 06/30/2021 ?Admitting Physician Vianne Bulls, MD ?[id number], [name], MD ? ?Brief Summary: ?Patient is a 61 y.o. [place] male with a history of chronic indwelling Foley catheter, DM-2, CAD s/p CABG, HLD-who presented to the hospital with acute metabolic encephalopathy due to sepsis from complicated UTI.  See below for further details. ? ?Significant events: ?3/25-3/27>> hospitalization for hypoglycemia/encephalopathy-refused SNF. ?4/20>> found at home by home health-lethargic/confused-hypotensive-hypothermic.  Admit to [facility]. ? ?Significant studies: ?4/20>> CXR: No PNA ?4/20>> renal ultrasound: Trace right perinephric fluid, echogenic debris in the distended bladder. ? ?Significant microbiology data: ?4/20>> COVID/influenza PCR: Negative ?4/20>> blood culture: No growth ?4/20>> urine culture: Pending ? ?Procedures: ? ?CT Head - premature ventriculomegaly, generalized atrophy. ? ?CT chest abdomen pelvis -  1. Severe hepatic steatosis. 2. Small volume of ascites and diffuse mesenteric edema. 3. Small bilateral pleural effusions lying dependently with some passive subsegmental atelectasis in the lower lobes of the lungs bilaterally. 4. Aortic atherosclerosis, in addition to left main and three-vessel coronary artery disease. Status post median sternotomy for CABG including LIMA to the LAD. ? ?MRI brain - No significant interval change. Age-advanced parenchymal atrophy with ventriculomegaly. 5 mm third ventricular colloid cyst. 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 Mild (but age-advanced) chronic small-vessel ischemic changes within the cerebral white matter. ? ?Consults: Urology ? ?Subjective:  Patient in bed, appears comfortable, denies any headache, no fever, no chest pain or pressure, no shortness of breath , no abdominal pain. No  new focal weakness. ? ? ?Objective:  ? ?Vitals: ?Blood pressure 114/60, pulse 65, temperature 97.9 ?F (36.6 ?C), temperature source Oral, resp. rate 17, height 5\' 6"  (1.676 m), weight 54.2 kg, SpO2 100 %.  ? ?Exam: ? ?Awake Alert, No new F.N deficits, Normal affect ?[place].AT,PERRAL ?Supple Neck, No JVD,   ?Symmetrical Chest wall movement, Good air movement bilaterally, CTAB ?RRR,No Gallops, Rubs or new Murmurs,  ?+ve B.Sounds, Abd Soft, No tenderness,   ?No Cyanosis, Clubbing or edema  ? ? ? ?Assessment/Plan: ? ?Sepsis due to complicated UTI: In a patient with indwelling Foley catheter who missed his urology outpatient appointment, urine appears clear, Foley being flushed, continue empiric IV antibiotics along with aggressive hydration.  Sepsis pathophysiology seems to have considerably improved.  Will monitor closely.  Blood cultures have stayed negative. ? ?Acute metabolic encephalopathy: Due to sepsis-severe dehydration, CT head shows signs of advanced vascular dementia, noted non acute MRI, EEG, TSH, RPR, B12, HIV, abnormal lipid panel, A1c, folate,  pending acute hepatitis panel - reordered, no headache, negative Kernig's and Brezinski sign. Continue PT, OT and speech to monitor as well.  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  Placed him on aspirin and statin, will most likely require placement to SNF ? ?Anion gap metabolic acidosis: Suspect due to combination of sepsis/hypotension/mild renal insufficiency/starvation ketoacidosis/metformin.  Resolved after IV bicarb and supportive care will monitor ? ?Hypokalemia/hypomagnesemia/hypophosphatemia: Aggressively replaced will monitor. ? ?CAD s/p CABG: No obvious anginal symptoms. ? ?Hypotension due to combination of dehydration and sepsis.  Hydrated, add midodrine, stable TSH and cortisol.  TED stockings  ? ?HLD: Restart Crestor monitor. ? 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 Louis Meckel urologist.  Outpatient follow-up with him continue Flomax. ? ?Insulin-dependent DM-2 (A1c 10.16 April 2021): With improving mentation his oral intake has improved low-dose Lantus and sliding scale QA CHS, poor outpatient control due to hyperglycemia. ? ?Recent Labs  ?  07/05/21 ?1559 07/05/21 ?2240 07/06/21 ?0825  ?GLUCAP 362* 191* 151*  ?  ? ? ?BMI: ?Estimated body mass index is 19.29 kg/m? as calculated from the following: ?  Height as of this encounter: 5\' 6"  (1.676 m). ?  Weight as of this encounter: 54.2 kg.  ? ?Code status: ?  Code Status: Full Code  ? ?DVT Prophylaxis: ?Place TED hose Start: 07/04/21 0842 ?heparin injection 5,000 Units Start: 06/30/21 2230 ?  ?Family Communication:  ? ?Sister [name]-[phone]-updated over the phone on 07/02/21, 07/03/21, 07/04/21 ? ? ?Disposition Plan: ?Status is: Inpatient ?Remains inpatient appropriate because: Sepsis from complicated UTI-encephalopathy-still very confused-sepsis physiology not yet resolved.  Not stable for discharge. ?  ?Planned Discharge Destination:Skilled nursing facility ? ? ?Diet: ?Diet Order   ? ?       ?  DIET DYS 3 Room service appropriate? Yes; Fluid consistency: Thin  Diet effective now       ?  ? ?  ?  ? ?  ?  ? ?MEDICATIONS: ?Scheduled Meds: ? (feeding supplement) PROSource Plus  30 mL Oral BID BM  ? aspirin EC  81 mg Oral Daily  ? Chlorhexidine Gluconate Cloth  6 each Topical Daily  ? heparin  5,000 Units Subcutaneous Q8H  ? insulin aspart  0-9 Units Subcutaneous TID WC  ? insulin glargine-yfgn  8 Units Subcutaneous Daily  ? lactose free nutrition  237 mL Oral TID WC  ? mouth rinse  15 mL Mouth Rinse BID  ? midodrine  10 mg Oral TID WC  ? naphazoline-glycerin  2 drop Both Eyes TID  ? pantoprazole  40 mg Oral Daily  ? rosuvastatin  10 mg Oral Daily  ? sodium chloride flush  3 mL Intravenous Q12H  ? tamsulosin  0.4 mg Oral Daily  ? ?Continuous Infusions: ? sodium chloride Stopped (07/05/21  0714)  ? ceFEPime (MAXIPIME) IV 2 g (07/06/21 0547)  ? ?PRN Meds:.sodium chloride, acetaminophen **OR** acetaminophen ? ? ?I have personally reviewed following labs and imaging studies ? ?LABORATORY DATA: ? ?Recent Labs  ?Lab 06/30/21 ?1637 07/01/21 ?0213 07/02/21 ?0246 07/03/21 ?0152 07/04/21 ?[id number] 07/05/21 ?0124 07/06/21 ?0118  ?WBC 6.6   < > 10.4 10.4 9.6 8.6 7.9  ?HGB 12.6*   < > 11.2* 11.2* 10.6* 11.1* 10.5*  ?HCT 34.7*   < > 29.9* 31.9* 30.0* 31.9* 30.3*  ?PLT 179   < > 124* 106* 100* 107* 93*  ?MCV 83.8   < > 82.6 85.1 85.0 86.4 86.3  ?MCH 30.4   < > 30.9 29.9 30.0 30.1 29.9  ?MCHC 36.3*   < > 37.5* 35.1 35.3 34.8 34.7  ?RDW 15.1   < > 15.5 15.6* 15.1 15.4 14.9  ?LYMPHSABS 0.6*  --   --  0.8 0.7 1.0 0.9  ?MONOABS 0.8  --   --  1.1* 1.0 1.0 0.8  ?EOSABS 0.0  --   --  0.0 0.1 0.1 0.1  ?BASOSABS 0.0  --   --  0.0 0.0 0.0 0.0  ? < > = values in this interval not displayed.  ? ? ?Recent Labs  ?Lab 06/30/21 ?1637 06/30/21 ?1722 06/30/21 ?2337 07/02/21 ?0246 07/03/21 ?0142 07/03/21 ?[id number] 07/03/21 ?1332  07/04/21 ?[id number] 07/05/21 ?0124 07/06/21 ?0118  ?NA 139  --    < > 138  --  137  --  135 138 136  ?K 3.4*  --    < > 2.7*  --  3.8  --  4.1 3.8 3.9  ?CL 106  --    < > 100  --  103  --  106 110 105  ?CO2 13*  --    < > 24  --  25  --  24 22 25   ?GLUCOSE 273*  --    < > 94  --  107*  --  141* 97 167*  ?BUN 28*  --    < > 11  --  7  --  5* 8 11  ?CREATININE 1.22  --    < > 0.91  --  0.79  --  0.69 0.73 0.64  ?CALCIUM 8.9  --    < > 7.4*  --  7.9*  --  8.4* 8.5* 8.4*  ?AST 16  --    < > 27  --  24  --  21 34 31  ?ALT 17  --    < > 20  --  19  --  18 27 28   ?ALKPHOS 121  --    < > 96  --  90  --  104 141* 141*  ?BILITOT 1.0  --    < > 1.0  --  0.9  --  0.1* 0.1* 0.5  ?ALBUMIN 2.9*  --    < > 2.1*  --  1.9*  --  1.8* 1.9* 1.9*  ?MG  --   --    < > 1.5*  --  1.8  --  1.5* 1.7 1.8  ?CRP  --   --   --   --   --  16.9*  --  16.7* 12.8* 8.6*  ?PROCALCITON  --   --   --  <0.10  --  <0.10  --  <0.10 <0.10 <0.10  ?LATICACIDVEN 1.6   --   --   --   --   --   --   --   --   --   ?INR  --  1.1  --   --   --   --  1.0  --   --   --   ?TSH  --   --   --   --  3.562  --   --   --   --   --   ?HGBA1C  --   --   --   --   --  10.4*  --   --

## 2021-07-06 NOTE — Progress Notes (Signed)
Occupational Therapy Treatment ?Patient Details ?Name: Charles [name] ?MRN: [id number] ?DOB: 1960/12/12 ?Today's Date: 07/06/2021 ? ? ?History of present illness 61 y/o male presented to ED on 06/30/21 for weakness and confusion with HHPT. Found to be hypotensive and bradycardic with EMS. Admitted for severe sepsis and UTI. Recent admission 3/25-3/27 for hypoglycemic event and unresponsiveness - d/c'ed home despite SNF recommendation. PMH: with medical history significant of HTN, CAD status post CABG, BPH, diabetes ?  ?OT comments ? Patient was motivated to participate in session. Patient participated in dynamic sitting balance challenges on edge of bed with moments of min guard for midline positioning. Patient majority of session needed mod A for sitting balance especially when one UE was not supporting body. Patient had no self righting strategies with increased time to process all cues. Patient would continue to benefit from skilled OT services at this time while admitted and after d/c to address noted deficits in order to improve overall safety and independence in ADLs.  ?  ? ?Recommendations for follow up therapy are one component of a multi-disciplinary discharge planning process, led by the attending physician.  Recommendations may be updated based on patient status, additional functional criteria and insurance authorization. ?   ?Follow Up Recommendations ? Skilled nursing-short term rehab (<3 hours/day)  ?  ?Assistance Recommended at Discharge Frequent or constant Supervision/Assistance  ?Patient can return home with the following ? Two people to help with walking and/or transfers;Two people to help with bathing/dressing/bathroom;Direct supervision/assist for medications management;Direct supervision/assist for financial management;Help with stairs or ramp for entrance;Assist for transportation ?  ?Equipment Recommendations ? BSC/3in1;Other (comment) (RW)  ?  ?Recommendations for Other Services   ? ?   ?Precautions / Restrictions Precautions ?Precautions: Fall ?Restrictions ?Weight Bearing Restrictions: No  ? ? ?  ? ?Mobility Bed Mobility ?Overal bed mobility: Needs Assistance ?Bed Mobility: Supine to Sit, Sit to Supine ?  ?  ?Supine to sit: Max assist, HOB elevated ?Sit to supine: Max assist ?  ?General bed mobility comments: patient needed physical assist, and multimodal cues with increased time to attempt to participate in tasks. ?  ? ?Transfers ?  ?  ?  ?  ?  ?  ?  ?  ?  ?  ?  ?  ?Balance Overall balance assessment: Needs assistance ?Sitting-balance support: Feet supported, No upper extremity supported ?Sitting balance-Leahy Scale: Poor ?  ?Postural control: Right lateral lean, Posterior lean ?  ?  ?  ?  ?  ?  ?  ?  ?  ?  ?  ?  ?  ?  ?   ? ?ADL either performed or assessed with clinical judgement  ? ?ADL Overall ADL's : Needs assistance/impaired ?  ?  ?Grooming: Wash/dry face;Maximal assistance;Sitting ?Grooming Details (indicate cue type and reason): patient was able to wash face sitting EOB but needed TD for sitting balance with leaning to the R side and anteriorly. ?  ?  ?  ?  ?  ?  ?  ?  ?  ?  ?  ?  ?  ?  ?  ?General ADL Comments: patients session focused on sitting balance on edge of bed with patients sitting balance being very inconsisent. patient would have breif moments of beign able to follow cues to self correct balance and maintain midline. patient needed physical assist to keep BLE from coming up off the floor. patient needed BUE support to maintain sitting balance. patient had no self righting strategies with patient leaning all  the way over towards foot of bed with pillow in place to prevent patient from bumping head on foot board. patient was educated on using BUE to push himself upright. patient verbalized understanding and was able to implement use of UE inconsistently during session. patient was TD for positioning in bed at end of session. ?  ? ?Extremity/Trunk Assessment   ?  ?  ?  ?  ?   ? ?Vision   ?  ?  ?Perception   ?  ?Praxis   ?  ? ?Cognition Arousal/Alertness: Awake/alert ?Behavior During Therapy: Flat affect ?Overall Cognitive Status: Difficult to assess ?Area of Impairment: Attention, Following commands, Safety/judgement, Awareness, Problem solving, Memory ?  ?  ?  ?  ?  ?  ?  ?  ?  ?Current Attention Level: Sustained ?Memory: Decreased short-term memory ?Following Commands: Follows one step commands inconsistently, Follows one step commands with increased time ?Safety/Judgement: Decreased awareness of safety, Decreased awareness of deficits ?Awareness: Intellectual ?Problem Solving: Slow processing, Decreased initiation, Difficulty sequencing, Requires verbal cues, Requires tactile cues ?General Comments: patient had difficulty with processing, and sequencing during session.when asked about prior living situation. patient reported " i live here now" ?  ?  ?   ?Exercises   ? ?  ?Shoulder Instructions   ? ? ?  ?General Comments    ? ? ?Pertinent Vitals/ Pain       Pain Assessment ?Pain Assessment: No/denies pain ? ?Home Living   ?  ?  ?  ?  ?  ?  ?  ?  ?  ?  ?  ?  ?  ?  ?  ?  ?  ?  ? ?  ?Prior Functioning/Environment    ?  ?  ?  ?   ? ?Frequency ? Min 2X/week  ? ? ? ? ?  ?Progress Toward Goals ? ?OT Goals(current goals can now be found in the care plan section) ? Progress towards OT goals: Progressing toward goals ? ?   ?Plan Discharge plan remains appropriate   ? ?Co-evaluation ? ? ?   ?  ?  ?  ?  ? ?  ?AM-PAC OT "6 Clicks" Daily Activity     ?Outcome Measure ? ? Help from another person eating meals?: A Lot ?Help from another person taking care of personal grooming?: A Lot ?Help from another person toileting, which includes using toliet, bedpan, or urinal?: Total ?Help from another person bathing (including washing, rinsing, drying)?: Total ?Help from another person to put on and taking off regular upper body clothing?: Total ?Help from another person to put on and taking off regular lower  body clothing?: Total ?6 Click Score: 8 ? ?  ?End of Session   ? ?OT Visit Diagnosis: Unsteadiness on feet (R26.81);Other abnormalities of gait and mobility (R26.89);Muscle weakness (generalized) (M62.81);History of falling (Z91.81) ?  ?Activity Tolerance Patient tolerated treatment well ?  ?Patient Left in bed;with call bell/phone within reach;with bed alarm set ?  ?Nurse Communication Other (comment) (ok to participate in session, updated on particiaption after session) ?  ? ?   ? ?Time: 7782-4235 ?OT Time Calculation (min): 23 min ? ?Charges: OT General Charges ?$OT Visit: 1 Visit ?OT Treatments ?$Therapeutic Activity: 23-37 mins ? ?Rolla Kedzierski OTR/L, MS ?Acute Rehabilitation Department ?Office# [phone] ?Pager# [phone] ? ? ?Barnabas Lister Rahn Lacuesta ?07/06/2021, 3:44 PM ?

## 2021-07-06 NOTE — TOC Progression Note (Signed)
Transition of Care (TOC) - Progression Note  ? ? ?Patient Details  ?Name: Charles [name] ?MRN: [id number] ?Date of Birth: 03/07/1961 ? ?Transition of Care (TOC) CM/SW Contact  ?Mearl Latin, LCSW ?Phone Number: ?07/06/2021, 2:21 PM ? ?Clinical Narrative:    ?CSW provided update to patient's sister that SNF insurance Berkley Harvey is still pending for [employer].  ? ? ?Expected Discharge Plan: Skilled Nursing Facility ?Barriers to Discharge: Continued Medical Work up, English as a second language teacher, SNF Pending bed offer ? ?Expected Discharge Plan and Services ?Expected Discharge Plan: Skilled Nursing Facility ?In-house Referral: Clinical Social Work ?  ?Post Acute Care Choice: Skilled Nursing Facility ?Living arrangements for the past 2 months: Single Family Home ?                ?  ?  ?  ?  ?  ?  ?  ?  ?  ?  ? ? ?Social Determinants of Health (SDOH) Interventions ?  ? ?Readmission Risk Interventions ? ?  07/04/2021  ?  2:49 PM  ?Readmission Risk Prevention Plan  ?Transportation Screening Complete  ?PCP or Specialist Appt within 5-7 Days Complete  ?Home Care Screening Complete  ? ? ?

## 2021-07-06 NOTE — Progress Notes (Signed)
Speech Language Pathology Treatment: Dysphagia  ?Patient Details ?Name: Charles [name] ?MRN: [id number] ?DOB: 20-Nov-1960 ?Today's Date: 07/06/2021 ?Time: 4650-3546 ?SLP Time Calculation (min) (ACUTE ONLY): 8 min ? ?Assessment / Plan / Recommendation ?Clinical Impression ? Pt's processing speech has improved since last session and pt making jokes with therapist. He accepted regular/thin liquids without sign aspiration. Currently meats are chopped and he masticated, propelled graham cracker effectively.  ?He denies coughing with liquids and is in agreement with upgrading food to regular texture. No further ST needed.  ?  ?HPI HPI: Patient is a 61 y.o. male with PMH: insulin-dependent DM, CAD, urinary retention with indwelling catheter who presented to the ED with lethargy and confusion. His sister had spoken with him on the phone in AM day before admission and he told her he was tired and jus wanted to sleep but on day of admission he was found by his home health therapist to be lethargic and confused. EMS was called and patient found to have BP of 76/38. Upon arrival in ED, patient's rectal temperature was 89 degrees F and systolic pressure was as low as 78. EKG features sinus bradycardia with incomplete LBBB. CXR was negative for acute cardiopulmonary disease, UA concerning for infection. ?  ?   ?SLP Plan ? All goals met;Discharge SLP treatment due to (comment) ? ?  ?  ?Recommendations for follow up therapy are one component of a multi-disciplinary discharge planning process, led by the attending physician.  Recommendations may be updated based on patient status, additional functional criteria and insurance authorization. ?  ? ?Recommendations  ?Diet recommendations: Regular;Thin liquid ?Liquids provided via: Cup;Straw ?Medication Administration: Whole meds with liquid ?Supervision: Patient able to self feed ?Compensations: Slow rate;Small sips/bites ?Postural Changes and/or Swallow Maneuvers: Seated upright 90 degrees   ?   ?    ?   ? ? ? ? Oral Care Recommendations: Oral care BID ?Follow Up Recommendations: No SLP follow up ?Assistance recommended at discharge: Intermittent Supervision/Assistance ?SLP Visit Diagnosis: Dysphagia, unspecified (R13.10) ?Plan: All goals met;Discharge SLP treatment due to (comment) ? ? ? ? ?  ?  ? ? ?[name] ? ?07/06/2021, 11:25 AM ?

## 2021-07-07 DIAGNOSIS — A419 Sepsis, unspecified organism: Secondary | ICD-10-CM | POA: Diagnosis not present

## 2021-07-07 DIAGNOSIS — N390 Urinary tract infection, site not specified: Secondary | ICD-10-CM | POA: Diagnosis not present

## 2021-07-07 LAB — COMPREHENSIVE METABOLIC PANEL
ALT: 24 U/L (ref 0–44)
AST: 19 U/L (ref 15–41)
Albumin: 2 g/dL — ABNORMAL LOW (ref 3.5–5.0)
Alkaline Phosphatase: 136 U/L — ABNORMAL HIGH (ref 38–126)
Anion gap: 6 (ref 5–15)
BUN: 9 mg/dL (ref 6–20)
CO2: 23 mmol/L (ref 22–32)
Calcium: 8.7 mg/dL — ABNORMAL LOW (ref 8.9–10.3)
Chloride: 105 mmol/L (ref 98–111)
Creatinine, Ser: 0.74 mg/dL (ref 0.61–1.24)
GFR, Estimated: >60 mL/min (ref >60)
Glucose, Bld: 196 mg/dL — ABNORMAL HIGH (ref 70–99)
Potassium: 4.4 mmol/L (ref 3.5–5.1)
Sodium: 134 mmol/L — ABNORMAL LOW (ref 135–145)
Total Bilirubin: 0.2 mg/dL — ABNORMAL LOW (ref 0.3–1.2)
Total Protein: 5.5 g/dL — ABNORMAL LOW (ref 6.5–8.1)

## 2021-07-07 LAB — CBC WITH DIFFERENTIAL/PLATELET
Abs Immature Granulocytes: 0.04 10*3/uL (ref 0.00–0.07)
Basophils Absolute: 0 10*3/uL (ref 0.0–0.1)
Basophils Relative: 0 %
Eosinophils Absolute: 0.1 10*3/uL (ref 0.0–0.5)
Eosinophils Relative: 2 %
HCT: 28.9 % — ABNORMAL LOW (ref 39.0–52.0)
Hemoglobin: 10.3 g/dL — ABNORMAL LOW (ref 13.0–17.0)
Immature Granulocytes: 1 %
Lymphocytes Relative: 13 %
Lymphs Abs: 0.9 10*3/uL (ref 0.7–4.0)
MCH: 30.5 pg (ref 26.0–34.0)
MCHC: 35.6 g/dL (ref 30.0–36.0)
MCV: 85.5 fL (ref 80.0–100.0)
Monocytes Absolute: 0.7 10*3/uL (ref 0.1–1.0)
Monocytes Relative: 10 %
Neutro Abs: 5.2 10*3/uL (ref 1.7–7.7)
Neutrophils Relative %: 74 %
Platelets: 102 10*3/uL — ABNORMAL LOW (ref 150–400)
RBC: 3.38 MIL/uL — ABNORMAL LOW (ref 4.22–5.81)
RDW: 14.7 % (ref 11.5–15.5)
WBC: 7 10*3/uL (ref 4.0–10.5)
nRBC: 0 % (ref 0.0–0.2)

## 2021-07-07 LAB — HCV INTERPRETATION

## 2021-07-07 LAB — GLUCOSE, CAPILLARY
Glucose-Capillary: 189 mg/dL — ABNORMAL HIGH (ref 70–99)
Glucose-Capillary: 151 mg/dL — ABNORMAL HIGH (ref 70–99)
Glucose-Capillary: 97 mg/dL (ref 70–99)
Glucose-Capillary: 365 mg/dL — ABNORMAL HIGH (ref 70–99)

## 2021-07-07 LAB — HEPATITIS PANEL, ACUTE

## 2021-07-07 LAB — PROCALCITONIN: Procalcitonin: <0.1 ng/mL

## 2021-07-07 MED ORDER — HALOPERIDOL LACTATE 5 MG/ML IJ SOLN: 2.0000 mg | Freq: Four times a day (QID) | INTRAMUSCULAR | Status: DC | PRN

## 2021-07-07 MED ORDER — EZETIMIBE 10 MG PO TABS
10.0000 mg | ORAL_TABLET | Freq: Every day | ORAL | Status: AC
Start: 2021-07-07 — End: ?

## 2021-07-07 MED ORDER — EZETIMIBE 10 MG PO TABS
10.0000 mg | ORAL_TABLET | Freq: Every day | ORAL | Status: DC
  Administered 2021-07-07: 10 mg via ORAL
  Filled 2021-07-07: qty 1

## 2021-07-07 MED ORDER — QUETIAPINE FUMARATE 25 MG PO TABS: 25.0000 mg | ORAL_TABLET | Freq: Two times a day (BID) | ORAL | Status: DC

## 2021-07-07 MED ORDER — ROSUVASTATIN CALCIUM 20 MG PO TABS: 40.0000 mg | ORAL_TABLET | Freq: Every day | ORAL | Status: DC

## 2021-07-07 MED ORDER — QUETIAPINE FUMARATE 25 MG PO TABS
25.0000 mg | ORAL_TABLET | Freq: Two times a day (BID) | ORAL | Status: DC
  Administered 2021-07-07: 25 mg via ORAL
  Filled 2021-07-07: qty 1

## 2021-07-07 MED ORDER — MIDODRINE HCL 10 MG PO TABS: 10.0000 mg | ORAL_TABLET | Freq: Three times a day (TID) | ORAL | Status: AC

## 2021-07-07 MED ORDER — HUMALOG KWIKPEN 100 UNIT/ML ~~LOC~~ SOPN: PEN_INJECTOR | SUBCUTANEOUS | 0 refills | Status: DC

## 2021-07-07 MED ORDER — INSULIN GLARGINE-YFGN 100 UNIT/ML ~~LOC~~ SOLN
14.0000 [IU] | Freq: Every day | SUBCUTANEOUS | Status: DC
  Filled 2021-07-07: qty 0.14

## 2021-07-07 MED ORDER — BASAGLAR KWIKPEN 100 UNIT/ML ~~LOC~~ SOPN: 14.0000 [IU] | PEN_INJECTOR | Freq: Every day | SUBCUTANEOUS | 0 refills | Status: DC

## 2021-07-07 MED ORDER — TAMSULOSIN HCL 0.4 MG PO CAPS: 0.4000 mg | ORAL_CAPSULE | Freq: Every day | ORAL | Status: DC

## 2021-07-07 NOTE — TOC Progression Note (Signed)
Transition of Care (TOC) - Progression Note  ? ? ?Patient Details  ?Name: Charles [name] ?MRN: [id number] ?Date of Birth: [birth year] ? ?Transition of Care (TOC) CM/SW Contact  ?Mearl Latin, LCSW ?Phone Number: ?07/07/2021, 11:59 AM ? ?Clinical Narrative:    ?Insurance approval has been received by [employer], per [place]. CSW updated patient's sister who is planning on visiting on Saturday. Will arrange transport.  ? ? ?Expected Discharge Plan: Skilled Nursing Facility ?Barriers to Discharge: Barriers Resolved ? ?Expected Discharge Plan and Services ?Expected Discharge Plan: Skilled Nursing Facility ?In-house Referral: Clinical Social Work ?  ?Post Acute Care Choice: Skilled Nursing Facility ?Living arrangements for the past 2 months: Single Family Home ?Expected Discharge Date: 07/07/21               ?  ?  ?  ?  ?  ?  ?  ?  ?  ?  ? ? ?Social Determinants of Health (SDOH) Interventions ?  ? ?Readmission Risk Interventions ? ?  07/04/2021  ?  2:49 PM  ?Readmission Risk Prevention Plan  ?Transportation Screening Complete  ?PCP or Specialist Appt within 5-7 Days Complete  ?Home Care Screening Complete  ? ? ?

## 2021-07-07 NOTE — Discharge Summary (Signed)
?                                                                                ? Charles [name] TOI:[id number] DOB: 02-09-1961 DOA: 06/30/2021 ? ?PCP: Darreld Mclean, MD ? ?Admit date: 06/30/2021  Discharge date: 07/07/2021 ? ?Admitted From: Home   Disposition:  SNF ? ? ?Recommendations for Outpatient Follow-up:  ? ?Follow up with PCP in 1-2 weeks ? ?PCP Please obtain BMP/CBC, 2 view CXR in 1week,  (see Discharge instructions)  ? ?PCP Please follow up on the following pending results: needs close outpt urology and neurology follow-up. ? ? ?Home Health: None   ?Equipment/Devices: None  ?Consultations: Urology ?Discharge Condition: Stable    ?CODE STATUS: Full    ?Diet Recommendation: Heart Healthy Low Carb ?  ? ?Chief Complaint  ?Patient presents with  ? Hypotension  ?  ? ?Brief history of present illness from the day of admission and additional interim summary   ? ?61 y.o. [place] male with a history of chronic indwelling Foley catheter, DM-2, CAD s/p CABG, HLD-who presented to the hospital with acute metabolic encephalopathy due to sepsis from complicated UTI.  See below for further details. ?  ?Significant events: ?3/25-3/27>> hospitalization for hypoglycemia/encephalopathy-refused SNF. ?4/20>> found at home by home health-lethargic/confused-hypotensive-hypothermic.  Admit to [facility]. ?  ?Significant studies: ?4/20>> CXR: No PNA ?4/20>> renal ultrasound: Trace right perinephric fluid, echogenic debris in the distended bladder. ?  ?Significant microbiology data: ?4/20>> COVID/influenza PCR: Negative ?4/20>> blood culture: No growth ?4/20>> urine culture: Pending ?  ?Procedures: ?  ?CT Head - premature ventriculomegaly, generalized atrophy. ?  ?CT chest abdomen pelvis -  1. Severe hepatic steatosis. 2. Small volume of ascites and diffuse mesenteric edema. 3. Small bilateral pleural effusions lying dependently with some passive  subsegmental atelectasis in the lower lobes of the lungs bilaterally. 4. Aortic atherosclerosis, in addition to left main and three-vessel coronary artery disease. Status post median sternotomy for CABG including LIMA to the LAD. ?  ?MRI brain - No significant interval change. Age-advanced parenchymal atrophy with ventriculomegaly. 5 mm third ventricular colloid cyst. 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 Mild (but age-advanced) chronic small-vessel ischemic changes within the cerebral white matter. ?  ?Consults: Urology ? ?                                                               Hospital Course  ? ? ? Sepsis due to complicated UTI: In a patient with indwelling Foley catheter who missed his urology outpatient appointment, he was treated appropriately with IV antibiotics and IV fluids, sepsis pathophysiology and toxic encephalopathy have resolved he is close to his baseline, he has finished his IV antibiotics treatment.  He will be discharged to SNF with a Foley catheter and outpatient urology follow-up. ?  ?Acute metabolic encephalopathy: Due to sepsis-severe dehydration, CT head shows signs of advanced vascular dementia, noted non acute MRI, EEG, TSH, RPR, B12, HIV, abnormal  lipid panel, A1c, folate, and acute hepatitis panel, no headache, negative Kernig's and Brezinski sign. Continue PT, OT and speech to monitor as well.   ? ?I think he has underlying advanced vascular dementia which got worse due to #1 above.  Placed him on aspirin, statin and Zetia, will most likely require placement to SNF on a permanent basis, outpatient neurology follow-up ?  ?  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 Louis Meckel urologist.  Outpatient follow-up with him continue Flomax. ? ?Anion gap metabolic acidosis: Suspect due to combination of sepsis/hypotension/mild renal insufficiency/starvation  ketoacidosis/metformin.  Resolved after IV bicarb and duration with IV fluids. ?  ?Hypokalemia/hypomagnesemia/hypophosphatemia: Aggressively replaced, recheck in 7 to 10 days at [facility]. ?  ?CAD s/p CABG: No obvious anginal symptoms.  Blood pressure too low for beta-blocker. ?  ?Hypotension due to combination of dehydration and sepsis.  Hydrated, add midodrine, stable TSH and cortisol.  TED stockings, stable now. ?  ?Dyslipidemia: Was on Crestor with LDL above goal Zetia added. ?  ?Insulin-dependent DM-2 (A1c 10.16 April 2021): Insulin regimen adjusted continue to check CBGs q. ACH S at SNF and titrate dose as needed.  For outpatient control due to hyperglycemia, need more insulin as his oral intake improves. ? ?Moderate to severe protein calorie malnutrition.  He was on protein supplements here continue per SNF protocol at SNF. ? ? ?Discharge diagnosis   ? ? ?Principal Problem: ?  Sepsis secondary to UTI [facility]) ?Active Problems: ?  Coronary artery disease involving native coronary artery of native heart without angina pectoris ?  Type 2 diabetes mellitus with complication, with long-term current use of insulin ([place]) ?  Metabolic encephalopathy ?  Urinary retention ?  Acute encephalopathy ?  Hypothermia ?  Metabolic acidosis ?  AKI (acute kidney injury) ([place]) ?  Pressure injury of skin ? ? ? ?Discharge instructions   ? ?Discharge Instructions   ? ? Discharge instructions   Complete by: As directed ?  ? Follow with Primary MD [name], [name], MD in 7 days  ? ?Get CBC, CMP, 2 view Chest X ray -  checked next visit within 1 week by  SNF MD  ? ?Activity: As tolerated with Full fall precautions use walker/cane & assistance as needed ? ?Disposition  SNF ? ?Diet: Heart Healthy - Low Carb, with feeding assistance and aspiration precautions. Check CBGs QAC-HS  ? ?Special Instructions: If you have smoked or chewed Tobacco  in the last 2 yrs please stop smoking, stop any regular Alcohol  and or any Recreational drug use. ? ?On  your next visit with your primary care physician please Get Medicines reviewed and adjusted. ? ?Please request your Prim.MD to go over all Hospital Tests and Procedure/Radiological results at the follow up, please get all Hospital records sent to your Prim MD by signing hospital release before you go home. ? ?If you experience worsening of your admission symptoms, develop shortness of breath, life threatening emergency, suicidal or homicidal thoughts you must seek medical attention immediately by calling 911 or calling your MD immediately  if symptoms less severe. ? ?You Must read complete instructions/literature along with all the possible adverse reactions/side effects for all the Medicines you take and that have been prescribed to you. Take any new Medicines after you have completely understood and accpet all the possible adverse reactions/side effects.  ? Increase activity slowly   Complete by: As directed ?  ? No wound  care   Complete by: As directed ?  ? ?  ? ? ?Discharge Medications  ? ?Allergies as of 07/07/2021   ?No Known Allergies ?  ? ?  ?Medication List  ?  ? ?STOP taking these medications   ? ?silodosin 8 MG Caps capsule ?Commonly known as: RAPAFLO ?  ? ?  ? ?TAKE these medications   ? ?aspirin EC 81 MG tablet ?Take 81 mg by mouth daily. Swallow whole. ?  ?Basaglar KwikPen 100 UNIT/ML ?Inject 14 Units into the skin daily. ?What changed: how much to take ?  ?blood glucose meter kit and supplies ?Dispense based on patient and insurance preference. Use up to four times daily as directed. (FOR ICD-10 E10.9, E11.9). ?  ?clotrimazole-betamethasone cream ?Commonly known as: LOTRISONE ?Apply topically 2 (two) times daily. Apply to foreskin ?What changed:  ?how much to take ?additional instructions ?  ?ezetimibe 10 MG tablet ?Commonly known as: ZETIA ?Take 1 tablet (10 mg total) by mouth daily. ?  ?FreeStyle Libre 2 Sensor Misc ?3 Units by Does not apply route 3 (three) times daily with meals. ?  ?gabapentin 300  MG capsule ?Commonly known as: Neurontin ?Take 1 capsule (300 mg total) by mouth at bedtime. ?What changed: when to take this ?  ?HumaLOG KwikPen 100 UNIT/ML KwikPen ?Generic drug: insulin lispro ?Before each meal 3 ti

## 2021-07-07 NOTE — Discharge Instructions (Signed)
Follow with Primary MD [name], [name], MD in 7 days  ? ?Get CBC, CMP, 2 view Chest X ray -  checked next visit within 1 week by  SNF MD  ? ?Activity: As tolerated with Full fall precautions use walker/cane & assistance as needed ? ?Disposition  SNF ? ?Diet: Heart Healthy - Low Carb, with feeding assistance and aspiration precautions. Check CBGs QAC-HS  ? ?Special Instructions: If you have smoked or chewed Tobacco  in the last 2 yrs please stop smoking, stop any regular Alcohol  and or any Recreational drug use. ? ?On your next visit with your primary care physician please Get Medicines reviewed and adjusted. ? ?Please request your Prim.MD to go over all Hospital Tests and Procedure/Radiological results at the follow up, please get all Hospital records sent to your Prim MD by signing hospital release before you go home. ? ?If you experience worsening of your admission symptoms, develop shortness of breath, life threatening emergency, suicidal or homicidal thoughts you must seek medical attention immediately by calling 911 or calling your MD immediately  if symptoms less severe. ? ?You Must read complete instructions/literature along with all the possible adverse reactions/side effects for all the Medicines you take and that have been prescribed to you. Take any new Medicines after you have completely understood and accpet all the possible adverse reactions/side effects.  ? ?  ?

## 2021-07-07 NOTE — TOC Transition Note (Signed)
Transition of Care (TOC) - CM/SW Discharge Note ? ? ?Patient Details  ?Name: Charles [name] ?MRN: [id number] ?Date of Birth: March 10, 1961 ? ?Transition of Care (TOC) CM/SW Contact:  ?Benard Halsted, LCSW ?Phone Number: ?07/07/2021, 12:05 PM ? ? ?Clinical Narrative:    ?Patient will DC to: [facility] ?Anticipated DC date: 07/07/21 ?Family notified: Sister, [name] ?Transport by: Corey Harold ? ? ?Per MD patient ready for DC to [facility]. RN to call report prior to discharge [phone]). RN, patient, patient's family, and facility notified of DC. Discharge Summary and FL2 sent to facility. DC packet on chart. Ambulance transport requested for patient.  ? ?CSW will sign off for now as social work intervention is no longer needed. Please consult [place] again if new needs arise. ? ? ? ? ?Final next level of care: [place] ?Barriers to Discharge: Barriers Resolved ? ? ?Patient Goals and CMS Choice ?Patient states their goals for this hospitalization and ongoing recovery are:: Get stonger ?CMS Medicare.gov Compare Post Acute Care list provided to:: Patient Represenative (must comment) ?Choice offered to / list presented to : Sibling ? ?Discharge Placement ?  ?Existing PASRR number confirmed : 07/07/21          ?Patient chooses bed at: Other - please specify in the comment section below: ?Patient to be transferred to facility by: [facility] ?Name of family member notified: Sister ?Patient and family notified of of transfer: 07/07/21 ? ?Discharge Plan and Services ?In-house Referral: Clinical Social Work ?  ?Post Acute Care Choice: [place]          ?  ?  ?  ?  ?  ?  ?  ?  ?  ?  ? ?Social Determinants of Health (SDOH) Interventions ?  ? ? ?Readmission Risk Interventions ? ?  07/04/2021  ?  2:49 PM  ?Readmission Risk Prevention Plan  ?Transportation Screening Complete  ?PCP or Specialist Appt within 5-7 Days Complete  ?Home Care Screening Complete  ? ? ? ? ? ?

## 2021-07-12 ENCOUNTER — Ambulatory Visit (INDEPENDENT_AMBULATORY_CARE_PROVIDER_SITE_OTHER): Payer: Commercial Managed Care - HMO | Admitting: Cardiology

## 2021-07-12 ENCOUNTER — Encounter (HOSPITAL_BASED_OUTPATIENT_CLINIC_OR_DEPARTMENT_OTHER): Payer: Self-pay | Admitting: Cardiology

## 2021-07-12 VITALS — BP 87/56 | HR 73 | Ht 66.0 in

## 2021-07-12 DIAGNOSIS — E118 Type 2 diabetes mellitus with unspecified complications: Secondary | ICD-10-CM | POA: Diagnosis not present

## 2021-07-12 DIAGNOSIS — Z951 Presence of aortocoronary bypass graft: Secondary | ICD-10-CM | POA: Diagnosis not present

## 2021-07-12 DIAGNOSIS — I959 Hypotension, unspecified: Secondary | ICD-10-CM | POA: Diagnosis not present

## 2021-07-12 DIAGNOSIS — R531 Weakness: Secondary | ICD-10-CM

## 2021-07-12 DIAGNOSIS — I2510 Atherosclerotic heart disease of native coronary artery without angina pectoris: Secondary | ICD-10-CM

## 2021-07-12 DIAGNOSIS — Z794 Long term (current) use of insulin: Secondary | ICD-10-CM

## 2021-07-12 NOTE — Patient Instructions (Signed)
Medication Instructions:  ?Your physician recommends that you continue on your current medications as directed. Please refer to the Current Medication list given to you today.  ? ?*If you need a refill on your cardiac medications before your next appointment, please call your pharmacy* ? ?Lab Work: ?NONE   ? ?Testing/Procedures: ?NONE  ? ?Follow-Up: ?At [facility], you and your health needs are our priority.  As part of our continuing mission to provide you with exceptional heart care, we have created designated Provider Care Teams.  These Care Teams include your primary Cardiologist (physician) and Advanced Practice Providers (APPs -  Physician Assistants and Nurse Practitioners) who all work together to provide you with the care you need, when you need it. ? ?We recommend signing up for the patient portal called "MyChart".  Sign up information is provided on this After Visit Summary.  MyChart is used to connect with patients for Virtual Visits (Telemedicine).  Patients are able to view lab/test results, encounter notes, upcoming appointments, etc.  Non-urgent messages can be sent to your provider as well.   ?To learn more about what you can do with MyChart, go to [email or web address].   ? ?Your next appointment:   ?3 month(s) ? ?The format for your next appointment:   ?In Person ? ?Provider:   ?Jodelle Red, MD ?  ?Other Instructions ? ?ENCOURAGE ORAL INTAKE FIRST AND THEN WOULD OFFER DIABETIC NUTRITIONAL SUPPLEMENT (BOOST, ENCURE, ETC) 3 TIMES A DAY AFTER MEALS  ?

## 2021-07-12 NOTE — Progress Notes (Signed)
?Cardiology Office Note:   ? ?Date:  07/12/2021  ? ?IDGeorgi [name], DOB 07-Aug-1960, MRN [id number] ? ?PCP:  Darreld Mclean, MD  ?Cardiologist:  Buford Dresser, MD ? ?CC: Follow-up ? ?History of Present Illness:   ? ?Charles [name] is a 61 y.o. male with a hx of hypertension, diabetes, and arthritis, who is seen for follow-up. I initially met him 03/22/2021 as a new consult at the request of Dr. [place] for the evaluation and management of hypertension and coronary artery disease. ? ?Cardiovascular risk factors: ?Prior clinical ASCVD: CAD s/p CABG in 2004 ?Comorbid conditions: Diabetes- First diagnosed as type 2 one year following CABG x4 in 2004, In 2022 he was told he had type 1 diabetes. Previously, he was off of Insulin and lost 20 lbs. He has gained some weight since restarting Insulin. Hypertension - Diagnosed 3 years  ago. Not controlled by medication currently, previously was on lisinopril. ?Exercise level: He states he is generally not able to walk due to imbalance, and he becomes short of breath with minimal exertion. This is concerning for him because he is no longer able to exercise, work, or walk without issues. He walks with a cane for assistance. He notes having extreme weakness, and getting out of bed in the morning or getting into his car can be difficult and painful. Also, he has fallen quite a few times. He no longer travels for work after he had a severe fall in a hotel room. 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  ? ?Hospital admission 04/21/2021 with profound diabetic ketoacidosis, nausea, vomiting, and dehydration ? ?Today: ?Patient discharged from hospital on 07/07/21. Discharge summary and hospital course reviewed, with full medication reconciliation today. ? ?He was admitted from 4/20-4/27/23 for acute metabolic encephalopathy. He was found at home by home health, was confused, hypotensive, and hypothermic. Testing suggested sepsis  from UTI from chronic indwelling foley catheter.  ? ?Here with medical aide this morning. Had a fall yesterday, hit a wall. Did not lose consciousness.  ? ?Now in skilled nursing at [facility]. Working with physical therapy. No fevers, intermittent chills. Has long term foley catheter. ? ?No chest pain. Does feel like he is going to pass out "all of the time". Not eating/drinking well, doesn't have much appetite. Sugars have been well controlled. Weight down 10 lbs. Feels like he doesn't drink much water. Discussed Boost and Ensure, he hasn't tried these, will recommend. ? ?Past Medical History:  ?Diagnosis Date  ? Arthritis   ? Diabetes mellitus without complication ([place])   ? Heart disease   ? Hypertension   ? ? ?Past Surgical History:  ?Procedure Laterality Date  ? CORONARY ARTERY BYPASS GRAFT    ? ? ?Current Medications: ?Current Outpatient Medications on File Prior to Visit  ?Medication Sig  ? aspirin EC 81 MG tablet Take 81 mg by mouth daily. Swallow whole.  ? blood glucose meter kit and supplies Dispense based on patient and insurance preference. Use up to four times daily as directed. (FOR ICD-10 E10.9, E11.9).  ? clotrimazole-betamethasone (LOTRISONE) cream Apply topically 2 (two) times daily. Apply to foreskin (Patient taking differently: Apply 1 application. topically 2 (two) times daily.)  ? Continuous Blood Gluc Sensor (FREESTYLE LIBRE 2 SENSOR) MISC 3 Units by Does not apply route 3 (three) times daily with meals.  ? ezetimibe (ZETIA) 10 MG tablet Take 1 tablet (10 mg total) by mouth daily.  ?  gabapentin (NEURONTIN) 300 MG capsule Take 1 capsule (300 mg total) by mouth at bedtime. (Patient taking differently: Take 300 mg by mouth daily.)  ? HUMALOG KWIKPEN 100 UNIT/ML KwikPen Before each meal 3 times a day, 140-199 - 2 units, 200-250 - 4 units, 251-299 - 6 units,  300-349 - 8 units,  350 or above 10 units.  ? Insulin Glargine (BASAGLAR KWIKPEN) 100 UNIT/ML Inject 14 Units into the skin daily.  ?  metFORMIN (GLUCOPHAGE) 1000 MG tablet TAKE 1 TABLET (1,000 MG TOTAL) BY MOUTH 2 (TWO) TIMES DAILY WITH A MEAL.  ? midodrine (PROAMATINE) 10 MG tablet Take 1 tablet (10 mg total) by mouth 3 (three) times daily with meals.  ? oxybutynin (DITROPAN) 5 MG tablet Take 1 tablet (5 mg total) by mouth every 8 (eight) hours as needed for bladder spasms.  ? pantoprazole (PROTONIX) 40 MG tablet Take 1 tablet (40 mg total) by mouth daily. Any generic PPI is okay to dispense (Patient taking differently: Take 40 mg by mouth daily.)  ? QUEtiapine (SEROQUEL) 25 MG tablet Take 1 tablet (25 mg total) by mouth 2 (two) times daily.  ? rosuvastatin (CRESTOR) 40 MG tablet Take 1 tablet (40 mg total) by mouth daily.  ? tamsulosin (FLOMAX) 0.4 MG CAPS capsule Take 1 capsule (0.4 mg total) by mouth daily.  ? ?No current facility-administered medications on file prior to visit.  ?  ? ?Allergies:   Patient has no known allergies.  ? ?Social History  ? ?Tobacco Use  ? Smoking status: Never  ? Smokeless tobacco: Never  ?Vaping Use  ? Vaping Use: Never used  ?Substance Use Topics  ? Alcohol use: Not Currently  ?  Comment: none in 6 months may open a bottle of wine tonight 11/[id number]  ? Drug use: Never  ? ? ?Family History: ?family history includes Diabetes in his father, mother, sister, and sister; Heart attack in his father; Hypertension in his father and mother. ? ?Severe, Mother and Father died of heart issues. "Everyone" in his family has hyperlipidemia. ? ?ROS:   ?Please see the history of present illness. ?All other systems are reviewed and negative.  ? ? ?EKGs/Labs/Other Studies Reviewed:   ? ?The following studies were reviewed today: ? ?Echo 03/24/2021: ?Sonographer Comments: Suboptimal parasternal window and suboptimal  ?subcostal window.  ?IMPRESSIONS  ? ? 1. Left ventricular ejection fraction, by estimation, is 60 to 65%. The  ?left ventricle has normal function. The left ventricle has no regional  ?wall motion abnormalities. Left  ventricular diastolic parameters are  ?consistent with Grade I diastolic  ?dysfunction (impaired relaxation).  ? 2. Right ventricular systolic function is normal. The right ventricular  ?size is mildly enlarged.  ? 3. The mitral valve is normal in structure. No evidence of mitral valve  ?regurgitation. No evidence of mitral stenosis.  ? 4. The aortic valve is normal in structure. Aortic valve regurgitation is  ?not visualized. No aortic stenosis is present.  ? 5. The inferior vena cava is normal in size with greater than 50%  ?respiratory variability, suggesting right atrial pressure of 3 mmHg.  ? ?EKG:  EKG is personally reviewed.   ?05/04/2021: NSR at 85 bpm with t wave abnormalities as noted previously ?03/22/2021: NSR at 94 bpm, abnormal T wave in inferolateral leads, no prior ? ?Recent Labs: ?07/03/2021: TSH 3.562 ?07/06/2021: B Natriuretic Peptide 115.1; Magnesium 1.8 ?07/07/2021: ALT 24; BUN 9; Creatinine, Ser 0.74; Hemoglobin 10.3; Platelets 102; Potassium 4.4; Sodium 134  ? ?Recent Lipid  Panel ?   ?Component Value Date/Time  ? CHOL 138 07/03/2021 0142  ? CHOL 328 (H) 12/23/2020 1140  ? TRIG 95 07/03/2021 0142  ? HDL 34 (L) 07/03/2021 0142  ? HDL 46 12/23/2020 1140  ? CHOLHDL 4.1 07/03/2021 0142  ? VLDL 19 07/03/2021 0142  ? [place] 85 07/03/2021 0142  ? LDLCALC 231 (H) 12/23/2020 1140  ? ? ?Physical Exam:   ? ?VS:  BP (!) 87/56   Pulse 73   Ht $R'5\' 6"'Yi$  (1.676 m)   SpO2 96%   BMI 19.29 kg/m?    ? ?Wt Readings from Last 3 Encounters:  ?07/06/21 119 lb 7.8 oz (54.2 kg)  ?06/06/21 123 lb 7.3 oz (56 kg)  ?05/09/21 129 lb 12.8 oz (58.9 kg)  ?  ?GEN: frail, muscle wasting, in no acute distress ?NECK: No JVD ?CARDIAC: regular rhythm, normal S1 and S2, no rubs or gallops. No murmur. ?VASCULAR: Radial pulses 2+ bilaterally.  ?RESPIRATORY:  Clear to auscultation without rales, wheezing or rhonchi  ?ABDOMEN: Soft, non-tender, non-distended ?MUSCULOSKELETAL:  Moves all 4 limbs independently but requires wheelchair. ?SKIN:  Warm and dry, no edema ?NEUROLOGIC:  No focal neuro deficits noted. ?PSYCHIATRIC:  Normal affect   ? ?ASSESSMENT:   ? ?1. Coronary artery disease involving native coronary artery of native heart without angina pe

## 2021-07-13 ENCOUNTER — Telehealth: Payer: Self-pay | Admitting: Cardiology

## 2021-07-13 ENCOUNTER — Telehealth: Payer: Self-pay | Admitting: Family Medicine

## 2021-07-13 ENCOUNTER — Ambulatory Visit (INDEPENDENT_AMBULATORY_CARE_PROVIDER_SITE_OTHER): Payer: Commercial Managed Care - HMO | Admitting: Physician Assistant

## 2021-07-13 ENCOUNTER — Ambulatory Visit (HOSPITAL_BASED_OUTPATIENT_CLINIC_OR_DEPARTMENT_OTHER): Payer: Commercial Managed Care - HMO | Admitting: Cardiology

## 2021-07-13 VITALS — BP 90/64 | HR 79 | Ht 66.0 in | Wt 119.5 lb

## 2021-07-13 DIAGNOSIS — N401 Enlarged prostate with lower urinary tract symptoms: Secondary | ICD-10-CM | POA: Diagnosis not present

## 2021-07-13 DIAGNOSIS — N138 Other obstructive and reflux uropathy: Secondary | ICD-10-CM | POA: Diagnosis not present

## 2021-07-13 NOTE — Telephone Encounter (Signed)
FYI ? ?[name] (sister) called to inform Dr. Lorelei Pont that pt has, over the past couple of weeks, been to the ED, seen a cardiologist on 5.2.23, and seen a urologist on 5.3.23.  ?

## 2021-07-13 NOTE — Telephone Encounter (Signed)
Fyi.

## 2021-07-13 NOTE — Telephone Encounter (Signed)
Pt's sister calling to get an update on how pt's appt went yesterday (5/2). Pt's sister states that pt is in a [employer]. Please advise ?

## 2021-07-13 NOTE — Progress Notes (Signed)
? ?Assessment: ?1. BPH with obstruction/lower urinary tract symptoms ?- Ambulatory referral to Urology ? ?Plan: ?Will r/s Urodynamics with [facility] and pt will FU with Dr. Alyson Ingles post study to discuss results. Order for SNF to change foley Q month starting 07/30/21.  ? ?Chief Complaint: ?Urinary retention ? ?HPI: ?Charles [name] is a 61 y.o. male with h/o indwelling catheter due to BPH and failed voiding trials who presents for continued evaluation of urinary retention. Pt is currently in skilled facility post DC from admission on 06/30/21 with dx of metabolic encephalopathy and urosepsis. Pt was discharged on 07/07/21 to [facility] in [place]. He presented for urodynamics on 4/20, but did not have the study due to his feeling poorly. CNA with SNF states pt has been drowzy and dozes often since arriving at the facility and the attending MD is aware of his presentation. ? ?07/05/21 ?61 year old poorly controlled diabetic with significant peripheral manifestations and a history of urinary retention presented to the hospital found down at home.  He was resuscitated and found to have urosepsis and dehydration.  He has been gradually making progress over the course of the last several days.  Today the Foley catheter was now draining very well, leaking all around it, and as such urology was consulted.  Because the catheter appeared not to be functioning I recommended that they take it out early and see if we could do a quasi voiding trial.  At the time of my encounter the patient had voided 900 cc on his own, seemingly all at once.  We subsequently BladderScan him and he had 850 remaining in his bladder. ? ?The patient has been seen and managed by urology in [place].  He recently had cystoscopic evaluation to assess for obstruction.  His prostate was noted to be relatively nonobstructing.  He was set up for urodynamics to evaluate his bladder function.  He has failed numerous voiding trials.  His last catheter was  exchanged on 3/20. ? ?05/30/21 ?Charles [name] is a 61yo here for followup for urinary retention. He has failed multiple voiding trials. Cystoscopy performed and foley replaced. Urodynamics ordered ? ? ?Portions of the above documentation were copied from a prior visit for review purposes only. ? ?Allergies: ?No Known Allergies ? ?PMH: ?Past Medical History:  ?Diagnosis Date  ? Arthritis   ? Diabetes mellitus without complication ([place])   ? Heart disease   ? Hypertension   ? ? ?PSH: ?Past Surgical History:  ?Procedure Laterality Date  ? CORONARY ARTERY BYPASS GRAFT    ? ? ?SH: ?Social History  ? ?Tobacco Use  ? Smoking status: Never  ? Smokeless tobacco: Never  ?Vaping Use  ? Vaping Use: Never used  ?Substance Use Topics  ? Alcohol use: Not Currently  ?  Comment: none in 6 months may open a bottle of wine tonight 11/1112022  ? Drug use: Never  ? ? ?ROS: ?Se HPI ? ?PE: ?BP 90/64   Pulse 79   Ht 5\' 6"  (1.676 m)   Wt 119 lb 7.8 oz (54.2 kg)   BMI 19.29 kg/m?  ?GENERAL APPEARANCE:  Pt is thin, well developed, and appears somnolent ?HEENT:  Atraumatic, normocephalic ?NECK:  Supple. Trachea midline ?ABDOMEN:  Soft, non-tender, no masses ?NEUROLOGIC:  Easily arousable, but dozes soon after speaking to provider. Answers all questions appropriately ?MENTAL STATUS:  appropriate ?SKIN:  Warm, dry, and intact ? ?Results: ?Laboratory Data: ?Lab Results  ?Component Value Date  ? WBC 7.0 07/07/2021  ? HGB 10.3 (L)  07/07/2021  ? HCT 28.9 (L) 07/07/2021  ? MCV 85.5 07/07/2021  ? PLT 102 (L) 07/07/2021  ? ? ?Lab Results  ?Component Value Date  ? CREATININE 0.74 07/07/2021  ? ? ?Lab Results  ?Component Value Date  ? HGBA1C 10.4 (H) 07/03/2021  ? ? ?Urinalysis ?   ?Component Value Date/Time  ? [place] YELLOW 06/30/2021 2023  ? APPEARANCEUR CLOUDY (A) 06/30/2021 2023  ? APPEARANCEUR Hazy (A) 05/09/2021 1006  ? LABSPEC 1.010 06/30/2021 2023  ? PHURINE 5.0 06/30/2021 2023  ? GLUCOSEU >=500 (A) 06/30/2021 2023  ? HGBUR MODERATE (A)  06/30/2021 2023  ? [place] NEGATIVE 06/30/2021 2023  ? BILIRUBINUR Negative 05/09/2021 1006  ? KETONESUR 80 (A) 06/30/2021 2023  ? [place] NEGATIVE 06/30/2021 2023  ? NITRITE POSITIVE (A) 06/30/2021 2023  ? LEUKOCYTESUR LARGE (A) 06/30/2021 2023  ? ? ?Lab Results  ?Component Value Date  ? LABMICR See below: 05/09/2021  ? [place] >30 (A) 05/09/2021  ? LABEPIT 0-10 05/09/2021  ? MUCUS Present 05/09/2021  ? BACTERIA RARE (A) 06/30/2021  ? ? ?Pertinent Imaging: ? ? ?Results for orders placed during the hospital encounter of 06/30/21 ? ?US RENAL ? ?Narrative ?CLINICAL DATA:  Acute kidney injury. ? ?EXAM: ?RENAL / URINARY TRACT ULTRASOUND COMPLETE ? ?COMPARISON:  None. ? ?FINDINGS: ?Right Kidney: ? ?Renal measurements: 11.1 cm x 4.6 cm x 4.6 cm = volume: 124.48 mL. ?Echogenicity within normal limits. No mass or hydronephrosis ?visualized. ? ?Left Kidney: ? ?Renal measurements: 10.4 cm x 4.7 cm x 4.9 cm = volume: 126.38 mL. ?Echogenicity within normal limits. No mass or hydronephrosis ?visualized. ? ?Bladder: ? ?A Foley catheter is seen within a distended urinary bladder which ?also contains echogenic debris. ? ?Other: ? ?A trace amount of perinephric fluid is seen on the right. ? ?IMPRESSION: ?1. Trace amount of right-sided perinephric fluid. ?2. Echogenic debris within a distended urinary bladder. While this ?may represent proteinaceous material, small amount of blood products ?cannot be excluded. ? ? ?Electronically Signed ?By: Virgina Norfolk M.D. ?On: 06/30/2021 23:06 ? ?No results found for this or any previous visit. ? ?No results found for this or any previous visit. ? ?No results found for this or any previous visit. ? ?No results found for this or any previous visit (from the past 24 hour(s)).  ?

## 2021-07-13 NOTE — Telephone Encounter (Signed)
Called and advised Charles [name], ok per DPR  Dr Harrell Gave says he is stable from cardiac standpoint  ? ?

## 2021-07-25 ENCOUNTER — Encounter: Payer: Self-pay | Admitting: Diagnostic Neuroimaging

## 2021-07-25 ENCOUNTER — Ambulatory Visit (INDEPENDENT_AMBULATORY_CARE_PROVIDER_SITE_OTHER): Payer: Self-pay | Admitting: Diagnostic Neuroimaging

## 2021-07-25 VITALS — BP 96/64 | HR 84

## 2021-07-25 DIAGNOSIS — Z794 Long term (current) use of insulin: Secondary | ICD-10-CM

## 2021-07-25 DIAGNOSIS — E118 Type 2 diabetes mellitus with unspecified complications: Secondary | ICD-10-CM

## 2021-07-25 DIAGNOSIS — R4189 Other symptoms and signs involving cognitive functions and awareness: Secondary | ICD-10-CM

## 2021-07-25 DIAGNOSIS — R29898 Other symptoms and signs involving the musculoskeletal system: Secondary | ICD-10-CM

## 2021-07-25 NOTE — Progress Notes (Signed)
? ?[facility] ? ?[name] ?DOB: 12/25/1960 ? ?REFERRING CLINICIAN: Elgergawy, Silver Huguenin, MD ?HISTORY FROM: patient  ?REASON FOR VISIT: new consult  ? ? ?HISTORICAL ? ?CHIEF COMPLAINT:  ?Chief Complaint  ?Patient presents with  ? New Patient (Initial Visit)  ?  Rm 6  alone. Last visit was in Feb of 2023 reports weakness in his legs have been the same but now is having trouble with urine incontinence.   ? ? ?HISTORY OF PRESENT ILLNESS:  ? ?UPDATE (07/25/21, VRP): Since last visit, doing poorly. Multiple admissions for weakness, hypoglycemia, sepsis. Now at [facility]. Trying to get stronger to get back home.  ? ?PRIOR HPI (04/13/21):  61 year old male with diabetes, coronary artery disease, hypertension, here for evaluation of gait and balance difficulty, lower extremity weakness, numbness, low back pain. ? ?Patient has a history of diabetes since 2004.  He has been on and off medication since that time.  Around 2018 he developed numbness and tingling in his feet.  Around 2020 he was still able to play golf.  In 2021 he went off of his medications for diabetes.  Diabetes control significant worsened around that time.  In last 6 to 12 months he has significantly declined.  He is having general fatigue, weakness, pain, lower extremity problems.  The significantly worsened around September 2022. ? ? ? ?REVIEW OF SYSTEMS: Full 14 system review of systems performed and negative with exception of: as per HPI. ? ?ALLERGIES: ?No Known Allergies ? ?HOME MEDICATIONS: ?Outpatient Medications Prior to Visit  ?Medication Sig Dispense Refill  ? aspirin EC 81 MG tablet Take 81 mg by mouth daily. Swallow whole.    ? blood glucose meter kit and supplies Dispense based on patient and insurance preference. Use up to four times daily as directed. (FOR ICD-10 E10.9, E11.9). 1 each 11  ? clotrimazole-betamethasone (LOTRISONE) cream Apply topically 2 (two) times daily. Apply to foreskin (Patient taking  differently: Apply 1 application. topically 2 (two) times daily.) 45 g 4  ? Continuous Blood Gluc Sensor (FREESTYLE LIBRE 2 SENSOR) MISC 3 Units by Does not apply route 3 (three) times daily with meals. 1 each 3  ? ezetimibe (ZETIA) 10 MG tablet Take 1 tablet (10 mg total) by mouth daily.    ? gabapentin (NEURONTIN) 300 MG capsule Take 1 capsule (300 mg total) by mouth at bedtime. (Patient taking differently: Take 300 mg by mouth daily.) 30 capsule 3  ? HUMALOG KWIKPEN 100 UNIT/ML KwikPen Before each meal 3 times a day, 140-199 - 2 units, 200-250 - 4 units, 251-299 - 6 units,  300-349 - 8 units,  350 or above 10 units. 15 mL 0  ? Insulin Glargine (BASAGLAR KWIKPEN) 100 UNIT/ML Inject 14 Units into the skin daily. 15 mL 0  ? metFORMIN (GLUCOPHAGE) 1000 MG tablet TAKE 1 TABLET (1,000 MG TOTAL) BY MOUTH 2 (TWO) TIMES DAILY WITH A MEAL. 60 tablet 0  ? midodrine (PROAMATINE) 10 MG tablet Take 1 tablet (10 mg total) by mouth 3 (three) times daily with meals.    ? oxybutynin (DITROPAN) 5 MG tablet Take 1 tablet (5 mg total) by mouth every 8 (eight) hours as needed for bladder spasms. 90 tablet 1  ? pantoprazole (PROTONIX) 40 MG tablet Take 1 tablet (40 mg total) by mouth daily. Any generic PPI is okay to dispense (Patient taking differently: Take 40 mg by mouth daily.) 30 tablet 1  ? QUEtiapine (SEROQUEL) 25 MG tablet Take 1 tablet (25 mg  total) by mouth 2 (two) times daily.    ? rosuvastatin (CRESTOR) 40 MG tablet Take 1 tablet (40 mg total) by mouth daily. 30 tablet 3  ? tamsulosin (FLOMAX) 0.4 MG CAPS capsule Take 1 capsule (0.4 mg total) by mouth daily. 30 capsule   ? ?No facility-administered medications prior to visit.  ? ? ?PAST MEDICAL HISTORY: ?Past Medical History:  ?Diagnosis Date  ? Arthritis   ? Diabetes mellitus without complication ([place])   ? Heart disease   ? Hypertension   ? ? ?PAST SURGICAL HISTORY: ?Past Surgical History:  ?Procedure Laterality Date  ? CORONARY ARTERY BYPASS GRAFT    ? ? ?FAMILY  HISTORY: ?Family History  ?Problem Relation Age of Onset  ? Diabetes Mother   ? Hypertension Mother   ? Diabetes Father   ? Hypertension Father   ? Heart attack Father   ? Diabetes Sister   ? Diabetes Sister   ? ? ?SOCIAL HISTORY: ?Social History  ? ?Socioeconomic History  ? Marital status: Single  ?  Spouse name: Not on file  ? Number of children: 0  ? Years of education: masters  ? Highest education level: Not on file  ?Occupational History  ? Occupation: semi retired/technology  ?Tobacco Use  ? Smoking status: Never  ? Smokeless tobacco: Never  ?Vaping Use  ? Vaping Use: Never used  ?Substance and Sexual Activity  ? Alcohol use: Not Currently  ?  Comment: none in 6 months may open a bottle of wine tonight [id number]  ? Drug use: Never  ? Sexual activity: Not on file  ?Other Topics Concern  ? Not on file  ?Social History Narrative  ? Right handed-   ? Caffeine - 3 cups per day  ? Resides at [facility] on [address]   ? ?Social Determinants of Health  ? ?Financial Resource Strain: Not on file  ?Food Insecurity: Not on file  ?Transportation Needs: Not on file  ?Physical Activity: Not on file  ?Stress: Not on file  ?Social Connections: Not on file  ?Intimate Partner Violence: Not on file  ? ? ? ?PHYSICAL EXAM ? ?GENERAL EXAM/CONSTITUTIONAL: ?Vitals:  ?Vitals:  ? 07/25/21 1453  ?BP: 96/64  ?Pulse: 84  ?SpO2: 100%  ? ? ?There is no height or weight on file to calculate BMI. ?Wt Readings from Last 3 Encounters:  ?07/13/21 119 lb 7.8 oz (54.2 kg)  ?07/06/21 119 lb 7.8 oz (54.2 kg)  ?06/06/21 123 lb 7.3 oz (56 kg)  ? ?FRAIL APPEARANCE ? ?CARDIOVASCULAR: ?Examination of carotid arteries is normal; no carotid bruits ?Regular rate and rhythm, no murmurs ?Examination of peripheral vascular system by observation and palpation is normal ? ?EYES: ?Ophthalmoscopic exam of optic discs and posterior segments is normal; no papilledema or hemorrhages ?No results found. ? ?MUSCULOSKELETAL: ?Gait, strength, tone, movements  noted in Neurologic exam below ? ?NEUROLOGIC: ?MENTAL STATUS:  ?   ? View : No data to display.  ?  ?  ?  ? ?awake, alert, oriented to person, place and time ?recent and remote memory intact ?normal attention and concentration ?language fluent, comprehension intact, naming intact ?fund of knowledge appropriate ? ?CRANIAL NERVE:  ?2nd - no papilledema on fundoscopic exam ?2nd, 3rd, 4th, 6th - pupils equal and reactive to light, visual fields full to confrontation, extraocular muscles intact, no nystagmus ?5th - facial sensation symmetric ?7th - facial strength symmetric ?8th - hearing intact ?9th - palate elevates symmetrically, uvula midline ?11th - shoulder shrug symmetric ?  12th - tongue protrusion midline ? ?MOTOR:  ?normal bulk and tone ?BUE 4+; BLE 4+ ? ?SENSORY:  ?normal and symmetric to light touch, temperature, vibration ? ?COORDINATION:  ?finger-nose-finger, fine finger movements normal ? ?REFLEXES:  ?deep tendon reflexes 1+ in arms; absent in lower ext ? ?GAIT/STATION:  ?IN WHEEL CHAIR; INDWELLING CATHETER ? ? ? ? ?DIAGNOSTIC DATA (LABS, IMAGING, TESTING) ?- I reviewed patient records, labs, notes, testing and imaging myself where available. ? ?Lab Results  ?Component Value Date  ? WBC 7.0 07/07/2021  ? HGB 10.3 (L) 07/07/2021  ? HCT 28.9 (L) 07/07/2021  ? MCV 85.5 07/07/2021  ? PLT 102 (L) 07/07/2021  ? ?   ?Component Value Date/Time  ? NA 134 (L) 07/07/2021 0147  ? NA 135 12/23/2020 1140  ? K 4.4 07/07/2021 0147  ? CL 105 07/07/2021 0147  ? CO2 23 07/07/2021 0147  ? GLUCOSE 196 (H) 07/07/2021 0147  ? BUN 9 07/07/2021 0147  ? BUN 14 12/23/2020 1140  ? CREATININE 0.74 07/07/2021 0147  ? CALCIUM 8.7 (L) 07/07/2021 0147  ? PROT 5.5 (L) 07/07/2021 0147  ? ALBUMIN 2.0 (L) 07/07/2021 0147  ? AST 19 07/07/2021 0147  ? ALT 24 07/07/2021 0147  ? ALKPHOS 136 (H) 07/07/2021 0147  ? BILITOT 0.2 (L) 07/07/2021 0147  ? GFRNONAA >60 07/07/2021 0147  ? ?Lab Results  ?Component Value Date  ? CHOL 138 07/03/2021  ? HDL 34  (L) 07/03/2021  ? [place] 85 07/03/2021  ? TRIG 95 07/03/2021  ? CHOLHDL 4.1 07/03/2021  ? ?Lab Results  ?Component Value Date  ? HGBA1C 10.4 (H) 07/03/2021  ? ?Lab Results  ?Component Value Date  ? VITAMINB12 1,0

## 2021-07-28 ENCOUNTER — Telehealth: Payer: Self-pay | Admitting: Physician Assistant

## 2021-07-28 NOTE — Telephone Encounter (Signed)
From [facility] for [facility].  "Rejection Reason - Patient did not respond" Valinda Party said on Jul 27, 2021 2:23 PM

## 2021-08-02 ENCOUNTER — Ambulatory Visit (HOSPITAL_BASED_OUTPATIENT_CLINIC_OR_DEPARTMENT_OTHER): Payer: Managed Care, Other (non HMO) | Admitting: Cardiology

## 2021-08-19 ENCOUNTER — Inpatient Hospital Stay (HOSPITAL_COMMUNITY)
Admission: EM | Admit: 2021-08-19 | Discharge: 2021-08-31 | DRG: 377 | Disposition: A | Discharge status: Skilled Nursing Facility | Payer: Commercial Managed Care - HMO | Attending: Internal Medicine | Admitting: Internal Medicine

## 2021-08-19 ENCOUNTER — Emergency Department (HOSPITAL_COMMUNITY): Payer: Commercial Managed Care - HMO

## 2021-08-19 ENCOUNTER — Other Ambulatory Visit: Payer: Self-pay

## 2021-08-19 ENCOUNTER — Encounter (HOSPITAL_COMMUNITY): Payer: Self-pay | Admitting: Emergency Medicine

## 2021-08-19 DIAGNOSIS — K625 Hemorrhage of anus and rectum: Principal | ICD-10-CM

## 2021-08-19 DIAGNOSIS — N320 Bladder-neck obstruction: Secondary | ICD-10-CM | POA: Diagnosis present

## 2021-08-19 DIAGNOSIS — L89312 Pressure ulcer of right buttock, stage 2: Secondary | ICD-10-CM | POA: Diagnosis present

## 2021-08-19 DIAGNOSIS — T83511D Infection and inflammatory reaction due to indwelling urethral catheter, subsequent encounter: Secondary | ICD-10-CM | POA: Diagnosis not present

## 2021-08-19 DIAGNOSIS — Z7982 Long term (current) use of aspirin: Secondary | ICD-10-CM

## 2021-08-19 DIAGNOSIS — D638 Anemia in other chronic diseases classified elsewhere: Secondary | ICD-10-CM | POA: Diagnosis present

## 2021-08-19 DIAGNOSIS — T83518A Infection and inflammatory reaction due to other urinary catheter, initial encounter: Secondary | ICD-10-CM | POA: Diagnosis present

## 2021-08-19 DIAGNOSIS — Z515 Encounter for palliative care: Secondary | ICD-10-CM | POA: Diagnosis not present

## 2021-08-19 DIAGNOSIS — E119 Type 2 diabetes mellitus without complications: Secondary | ICD-10-CM

## 2021-08-19 DIAGNOSIS — I10 Essential (primary) hypertension: Secondary | ICD-10-CM | POA: Diagnosis present

## 2021-08-19 DIAGNOSIS — E43 Unspecified severe protein-calorie malnutrition: Secondary | ICD-10-CM | POA: Diagnosis present

## 2021-08-19 DIAGNOSIS — Z960 Presence of urogenital implants: Secondary | ICD-10-CM | POA: Diagnosis present

## 2021-08-19 DIAGNOSIS — K626 Ulcer of anus and rectum: Secondary | ICD-10-CM | POA: Diagnosis present

## 2021-08-19 DIAGNOSIS — E1140 Type 2 diabetes mellitus with diabetic neuropathy, unspecified: Secondary | ICD-10-CM | POA: Diagnosis present

## 2021-08-19 DIAGNOSIS — I2510 Atherosclerotic heart disease of native coronary artery without angina pectoris: Secondary | ICD-10-CM

## 2021-08-19 DIAGNOSIS — R634 Abnormal weight loss: Secondary | ICD-10-CM | POA: Diagnosis not present

## 2021-08-19 DIAGNOSIS — E11649 Type 2 diabetes mellitus with hypoglycemia without coma: Secondary | ICD-10-CM | POA: Diagnosis not present

## 2021-08-19 DIAGNOSIS — K648 Other hemorrhoids: Secondary | ICD-10-CM | POA: Diagnosis not present

## 2021-08-19 DIAGNOSIS — F05 Delirium due to known physiological condition: Secondary | ICD-10-CM | POA: Diagnosis present

## 2021-08-19 DIAGNOSIS — Z66 Do not resuscitate: Secondary | ICD-10-CM | POA: Diagnosis present

## 2021-08-19 DIAGNOSIS — Y846 Urinary catheterization as the cause of abnormal reaction of the patient, or of later complication, without mention of misadventure at the time of the procedure: Secondary | ICD-10-CM | POA: Diagnosis present

## 2021-08-19 DIAGNOSIS — Z794 Long term (current) use of insulin: Secondary | ICD-10-CM | POA: Diagnosis not present

## 2021-08-19 DIAGNOSIS — Z79899 Other long term (current) drug therapy: Secondary | ICD-10-CM

## 2021-08-19 DIAGNOSIS — K5730 Diverticulosis of large intestine without perforation or abscess without bleeding: Secondary | ICD-10-CM

## 2021-08-19 DIAGNOSIS — G9341 Metabolic encephalopathy: Secondary | ICD-10-CM

## 2021-08-19 DIAGNOSIS — N401 Enlarged prostate with lower urinary tract symptoms: Secondary | ICD-10-CM | POA: Diagnosis not present

## 2021-08-19 DIAGNOSIS — R410 Disorientation, unspecified: Secondary | ICD-10-CM | POA: Diagnosis not present

## 2021-08-19 DIAGNOSIS — R627 Adult failure to thrive: Secondary | ICD-10-CM | POA: Diagnosis present

## 2021-08-19 DIAGNOSIS — D509 Iron deficiency anemia, unspecified: Secondary | ICD-10-CM | POA: Diagnosis present

## 2021-08-19 DIAGNOSIS — T83511A Infection and inflammatory reaction due to indwelling urethral catheter, initial encounter: Secondary | ICD-10-CM | POA: Diagnosis not present

## 2021-08-19 DIAGNOSIS — E1165 Type 2 diabetes mellitus with hyperglycemia: Secondary | ICD-10-CM | POA: Diagnosis present

## 2021-08-19 DIAGNOSIS — I951 Orthostatic hypotension: Secondary | ICD-10-CM

## 2021-08-19 DIAGNOSIS — K635 Polyp of colon: Secondary | ICD-10-CM | POA: Diagnosis not present

## 2021-08-19 DIAGNOSIS — D72829 Elevated white blood cell count, unspecified: Secondary | ICD-10-CM

## 2021-08-19 DIAGNOSIS — F0150 Vascular dementia without behavioral disturbance: Secondary | ICD-10-CM | POA: Diagnosis present

## 2021-08-19 DIAGNOSIS — N390 Urinary tract infection, site not specified: Secondary | ICD-10-CM

## 2021-08-19 DIAGNOSIS — Z951 Presence of aortocoronary bypass graft: Secondary | ICD-10-CM

## 2021-08-19 DIAGNOSIS — Z8249 Family history of ischemic heart disease and other diseases of the circulatory system: Secondary | ICD-10-CM

## 2021-08-19 DIAGNOSIS — Z7189 Other specified counseling: Secondary | ICD-10-CM

## 2021-08-19 DIAGNOSIS — N138 Other obstructive and reflux uropathy: Secondary | ICD-10-CM | POA: Diagnosis not present

## 2021-08-19 DIAGNOSIS — B9620 Unspecified Escherichia coli [E. coli] as the cause of diseases classified elsewhere: Secondary | ICD-10-CM | POA: Diagnosis present

## 2021-08-19 DIAGNOSIS — D62 Acute posthemorrhagic anemia: Secondary | ICD-10-CM

## 2021-08-19 DIAGNOSIS — E876 Hypokalemia: Secondary | ICD-10-CM | POA: Diagnosis not present

## 2021-08-19 DIAGNOSIS — R42 Dizziness and giddiness: Secondary | ICD-10-CM | POA: Diagnosis present

## 2021-08-19 DIAGNOSIS — K5731 Diverticulosis of large intestine without perforation or abscess with bleeding: Secondary | ICD-10-CM | POA: Diagnosis present

## 2021-08-19 DIAGNOSIS — E785 Hyperlipidemia, unspecified: Secondary | ICD-10-CM | POA: Diagnosis present

## 2021-08-19 DIAGNOSIS — D500 Iron deficiency anemia secondary to blood loss (chronic): Secondary | ICD-10-CM | POA: Diagnosis not present

## 2021-08-19 DIAGNOSIS — R5381 Other malaise: Secondary | ICD-10-CM | POA: Diagnosis not present

## 2021-08-19 DIAGNOSIS — Z681 Body mass index (BMI) 19 or less, adult: Secondary | ICD-10-CM

## 2021-08-19 DIAGNOSIS — R64 Cachexia: Secondary | ICD-10-CM | POA: Diagnosis present

## 2021-08-19 DIAGNOSIS — K922 Gastrointestinal hemorrhage, unspecified: Secondary | ICD-10-CM | POA: Diagnosis not present

## 2021-08-19 DIAGNOSIS — K219 Gastro-esophageal reflux disease without esophagitis: Secondary | ICD-10-CM | POA: Diagnosis present

## 2021-08-19 DIAGNOSIS — I959 Hypotension, unspecified: Secondary | ICD-10-CM | POA: Diagnosis not present

## 2021-08-19 DIAGNOSIS — K649 Unspecified hemorrhoids: Secondary | ICD-10-CM

## 2021-08-19 DIAGNOSIS — R4189 Other symptoms and signs involving cognitive functions and awareness: Secondary | ICD-10-CM | POA: Diagnosis present

## 2021-08-19 DIAGNOSIS — Z833 Family history of diabetes mellitus: Secondary | ICD-10-CM

## 2021-08-19 DIAGNOSIS — N400 Enlarged prostate without lower urinary tract symptoms: Secondary | ICD-10-CM

## 2021-08-19 DIAGNOSIS — M6284 Sarcopenia: Secondary | ICD-10-CM | POA: Diagnosis present

## 2021-08-19 DIAGNOSIS — Z7984 Long term (current) use of oral hypoglycemic drugs: Secondary | ICD-10-CM

## 2021-08-19 DIAGNOSIS — R159 Full incontinence of feces: Secondary | ICD-10-CM | POA: Diagnosis present

## 2021-08-19 DIAGNOSIS — R338 Other retention of urine: Secondary | ICD-10-CM | POA: Diagnosis present

## 2021-08-19 LAB — CBC WITH DIFFERENTIAL/PLATELET
Abs Immature Granulocytes: 0.03 10*3/uL (ref 0.00–0.07)
Basophils Absolute: 0 10*3/uL (ref 0.0–0.1)
Basophils Relative: 0 %
Eosinophils Absolute: 0.1 10*3/uL (ref 0.0–0.5)
Eosinophils Relative: 1 %
HCT: 32 % — ABNORMAL LOW (ref 39.0–52.0)
Hemoglobin: 10.5 g/dL — ABNORMAL LOW (ref 13.0–17.0)
Immature Granulocytes: 0 %
Lymphocytes Relative: 13 %
Lymphs Abs: 1.3 10*3/uL (ref 0.7–4.0)
MCH: 29.2 pg (ref 26.0–34.0)
MCHC: 32.8 g/dL (ref 30.0–36.0)
MCV: 89.1 fL (ref 80.0–100.0)
Monocytes Absolute: 0.6 10*3/uL (ref 0.1–1.0)
Monocytes Relative: 6 %
Neutro Abs: 8.2 10*3/uL — ABNORMAL HIGH (ref 1.7–7.7)
Neutrophils Relative %: 80 %
Platelets: 340 10*3/uL (ref 150–400)
RBC: 3.59 MIL/uL — ABNORMAL LOW (ref 4.22–5.81)
RDW: 14.3 % (ref 11.5–15.5)
WBC: 10.3 10*3/uL (ref 4.0–10.5)
nRBC: 0 % (ref 0.0–0.2)

## 2021-08-19 LAB — COMPREHENSIVE METABOLIC PANEL
ALT: 13 U/L (ref 0–44)
AST: 21 U/L (ref 15–41)
Albumin: 2.5 g/dL — ABNORMAL LOW (ref 3.5–5.0)
Alkaline Phosphatase: 102 U/L (ref 38–126)
Anion gap: 11 (ref 5–15)
BUN: 13 mg/dL (ref 6–20)
CO2: 23 mmol/L (ref 22–32)
Calcium: 8.7 mg/dL — ABNORMAL LOW (ref 8.9–10.3)
Chloride: 101 mmol/L (ref 98–111)
Creatinine, Ser: 0.75 mg/dL (ref 0.61–1.24)
GFR, Estimated: >60 mL/min (ref >60)
Glucose, Bld: 256 mg/dL — ABNORMAL HIGH (ref 70–99)
Potassium: 4.1 mmol/L (ref 3.5–5.1)
Sodium: 135 mmol/L (ref 135–145)
Total Bilirubin: 0.4 mg/dL (ref 0.3–1.2)
Total Protein: 6.5 g/dL (ref 6.5–8.1)

## 2021-08-19 IMAGING — CT CT ABD-PELV W/O CM
2 of 4 series · 16 of 46 positions shown, 18 images · non-contrast
Comparison: [DATE]

CLINICAL DATA: Abdominal pain



[Series 3: ap without · axial · non-contrast · 0.65mm/px · z∈[+676,+1076]mm · 13 of 90 slices shown, 15 images]
[im 5/90  soft-tissue]
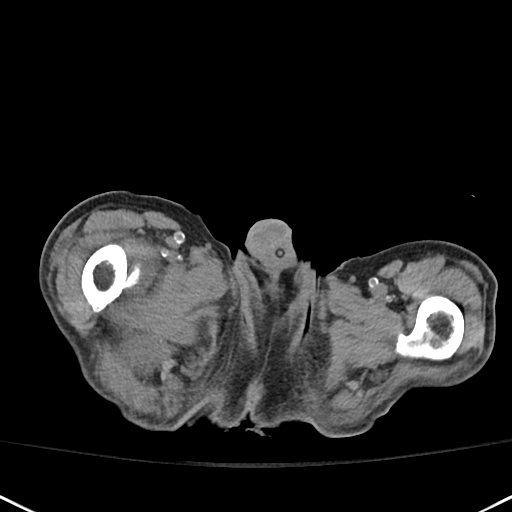
[im 5/90  bone]
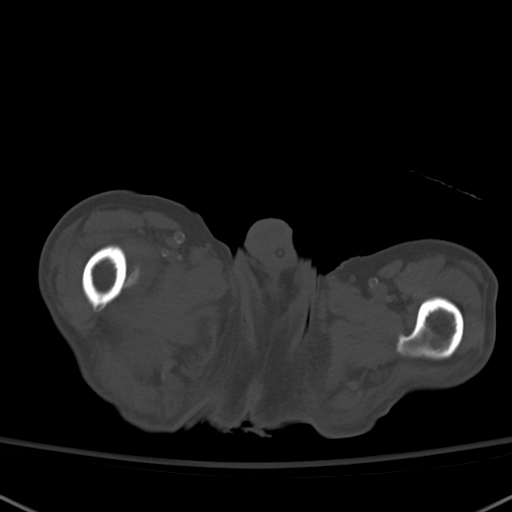
[im 15/90  soft-tissue]
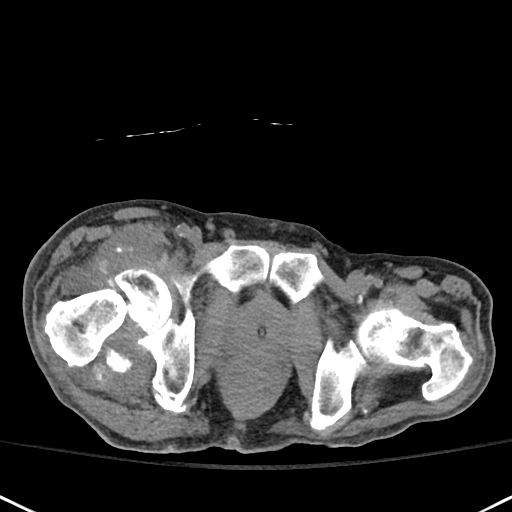
[im 19/90  soft-tissue]
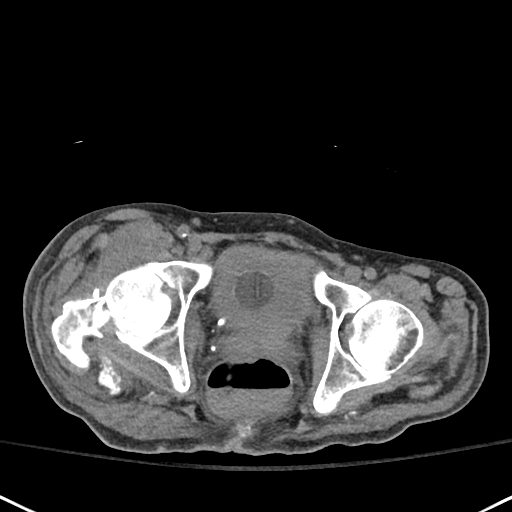
[im 24/90  soft-tissue]
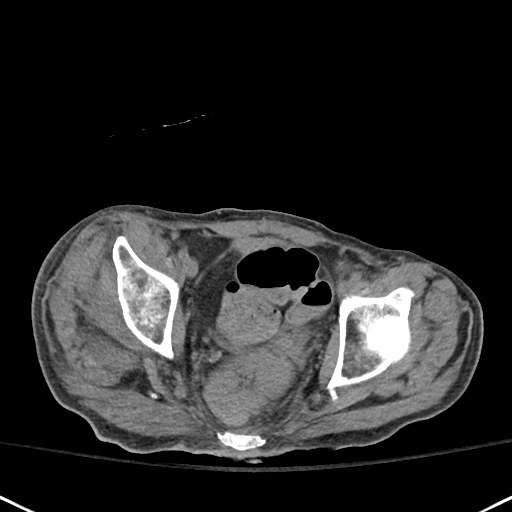
[im 33/90  soft-tissue]
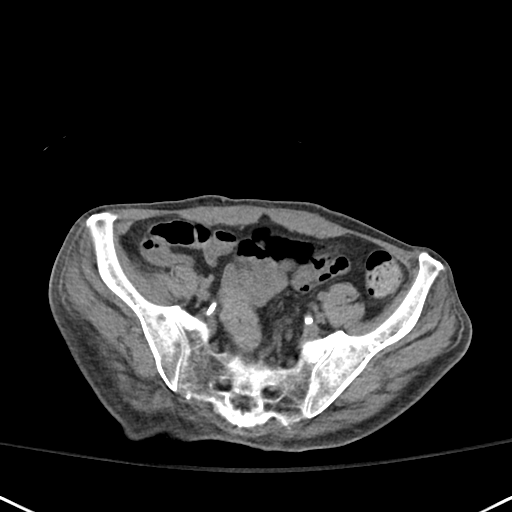
[im 38/90  soft-tissue]
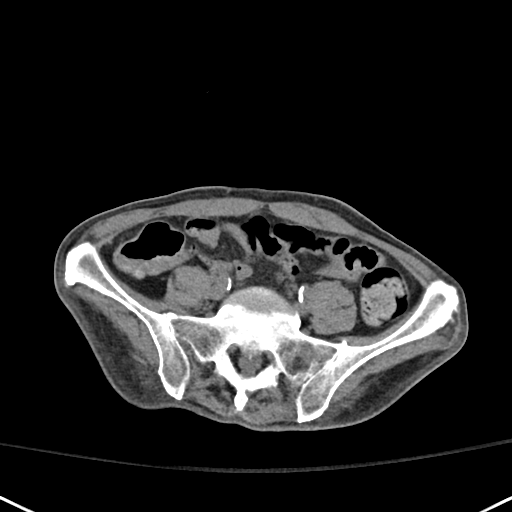
[im 47/90  soft-tissue]
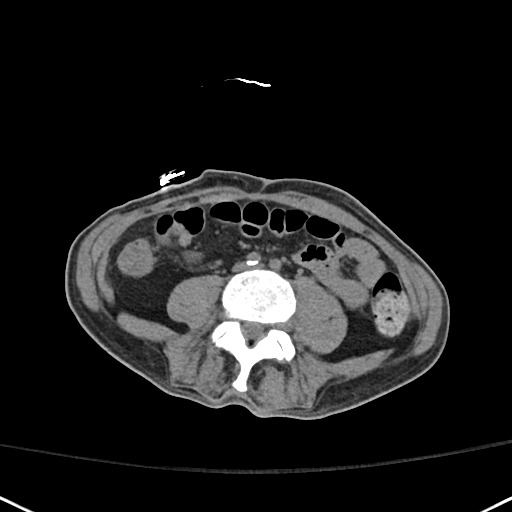
[im 52/90  soft-tissue]
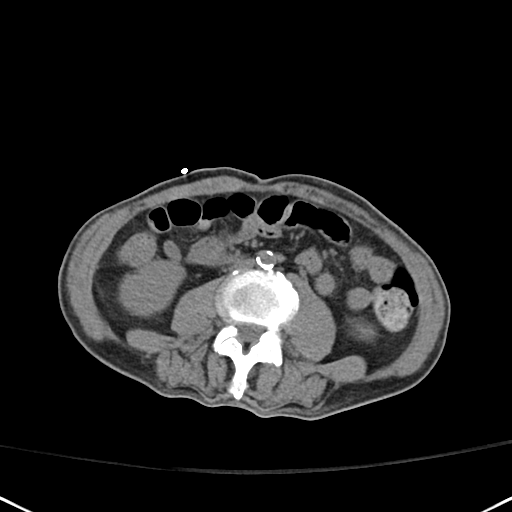
[im 57/90  soft-tissue]
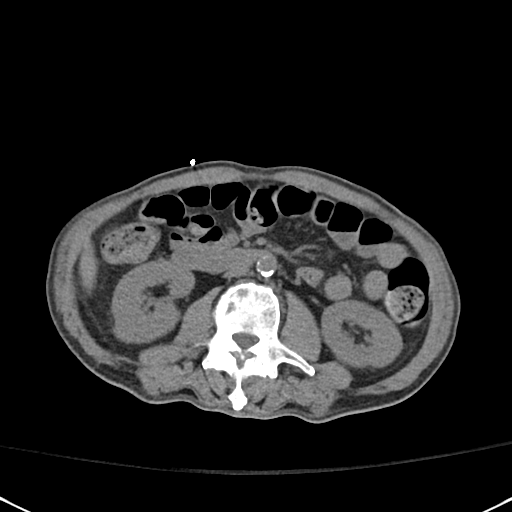
[im 57/90  bone]
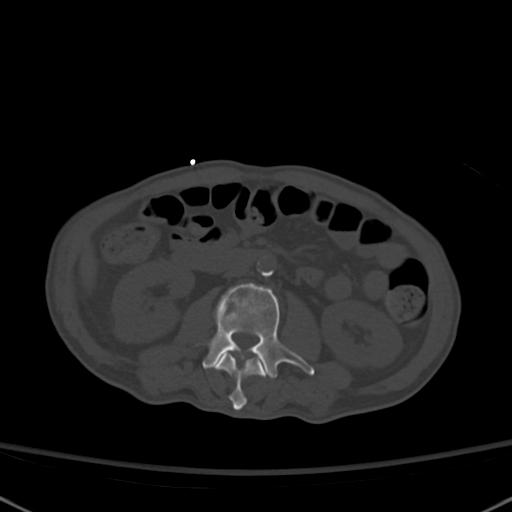
[im 66/90  soft-tissue]
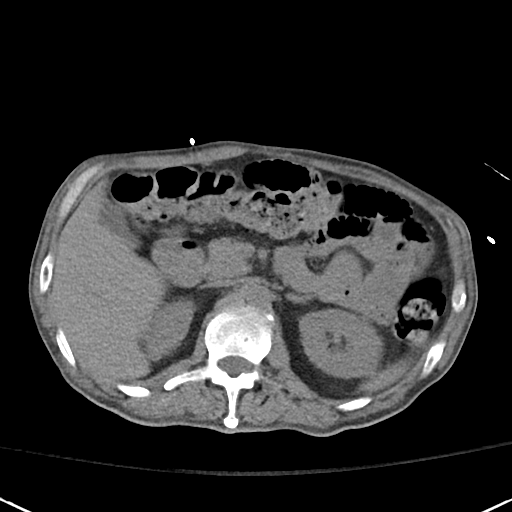
[im 71/90  soft-tissue]
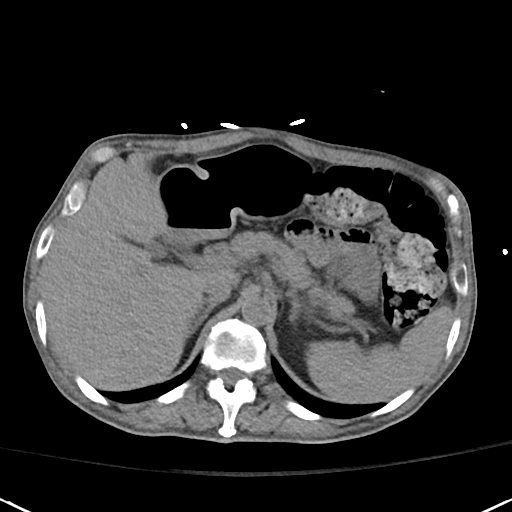
[im 75/90  soft-tissue]
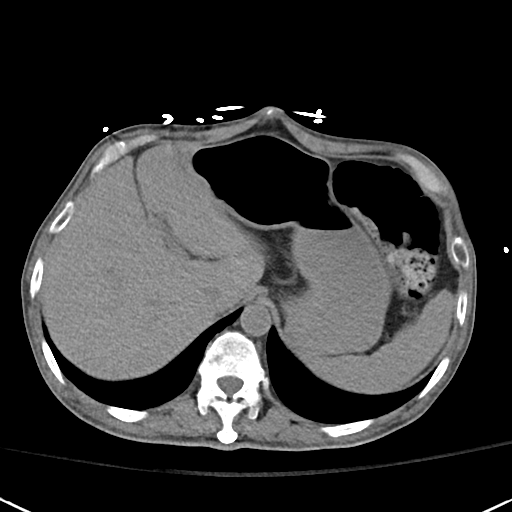
[im 85/90  soft-tissue]
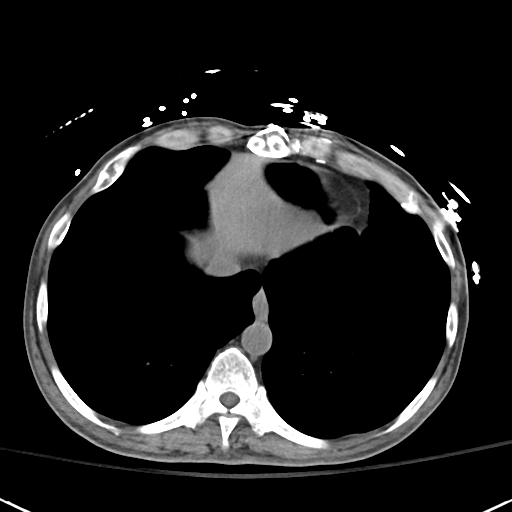

[Series 6: cor · coronal · 0.69mm/px · 3 of 76 slices shown]
[im 26/76  soft-tissue]
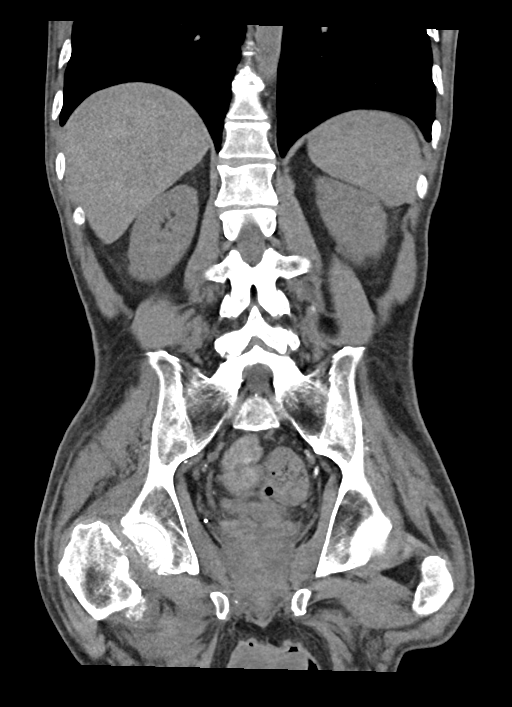
[im 34/76  soft-tissue]
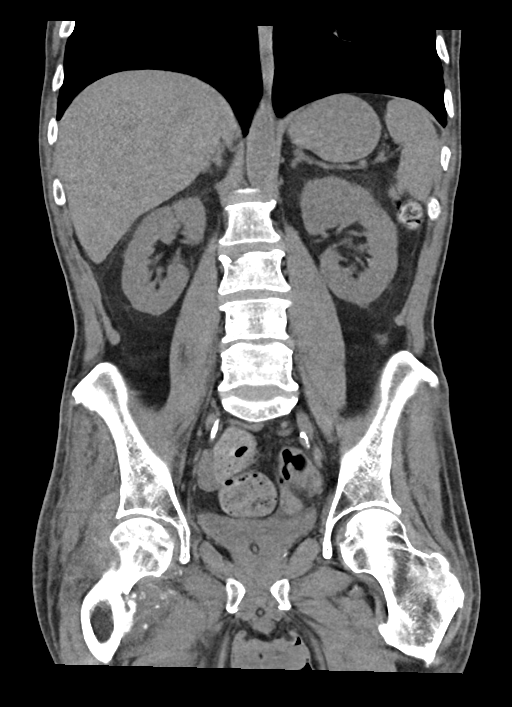
[im 42/76  soft-tissue]
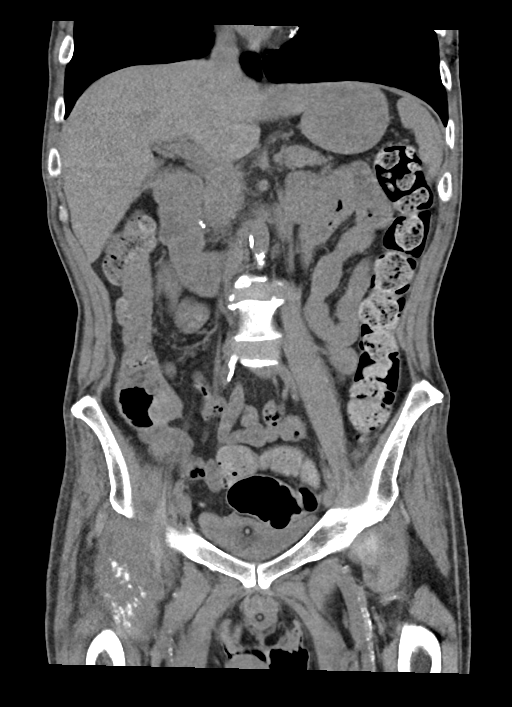

[16 of 46 positions shown; findings below may reference images not displayed]

FINDINGS: Lower Chest: Normal.

Hepatobiliary: Normal hepatic contours. No intra- or extrahepatic
biliary dilatation. The gallbladder is normal.

Pancreas: Normal pancreas. No ductal dilatation or peripancreatic
fluid collection.

Spleen: Normal.

Adrenals/Urinary Tract: The adrenal glands are normal. No
hydronephrosis, nephroureterolithiasis or solid renal mass.
Decompressed by Foley catheter.

Stomach/Bowel: There is no hiatal hernia. Normal duodenal course and
caliber. No small bowel dilatation or inflammation. No focal colonic
abnormality. Normal appendix.

Vascular/Lymphatic: There is calcific atherosclerosis of the
abdominal aorta. No lymphadenopathy.

Reproductive: Normal prostate size with symmetric seminal vesicles.

Other: None.

Musculoskeletal: No bony spinal canal stenosis or focal osseous
abnormality.
IMPRESSION: 1. No acute abnormality of the abdomen or pelvis.
2. Aortic Atherosclerosis ([PH]-[PH]).

## 2021-08-19 MED ORDER — ACETAMINOPHEN 325 MG PO TABS
650.0000 mg | ORAL_TABLET | Freq: Four times a day (QID) | ORAL | Status: DC | PRN
  Administered 2021-08-26: 650 mg via ORAL
  Filled 2021-08-19: qty 2

## 2021-08-19 MED ORDER — SODIUM CHLORIDE 0.9 % IV SOLN: INTRAVENOUS | Status: AC

## 2021-08-19 MED ORDER — LACTATED RINGERS IV BOLUS
1000.0000 mL | Freq: Once | INTRAVENOUS | Status: AC
  Administered 2021-08-19: 1000 mL via INTRAVENOUS

## 2021-08-19 MED ORDER — ACETAMINOPHEN 650 MG RE SUPP: 650.0000 mg | Freq: Four times a day (QID) | RECTAL | Status: DC | PRN

## 2021-08-19 MED ORDER — INSULIN ASPART 100 UNIT/ML IJ SOLN
0.0000 [IU] | INTRAMUSCULAR | Status: DC
  Administered 2021-08-20: 1 [IU] via SUBCUTANEOUS
  Administered 2021-08-20: 2 [IU] via SUBCUTANEOUS
  Administered 2021-08-20: 1 [IU] via SUBCUTANEOUS
  Administered 2021-08-20: 3 [IU] via SUBCUTANEOUS
  Administered 2021-08-21: 2 [IU] via SUBCUTANEOUS
  Administered 2021-08-21 (×2): 3 [IU] via SUBCUTANEOUS
  Administered 2021-08-22: 1 [IU] via SUBCUTANEOUS
  Administered 2021-08-22 (×3): 3 [IU] via SUBCUTANEOUS
  Administered 2021-08-22: 2 [IU] via SUBCUTANEOUS
  Administered 2021-08-22: 3 [IU] via SUBCUTANEOUS
  Administered 2021-08-23: 7 [IU] via SUBCUTANEOUS
  Administered 2021-08-23: 1 [IU] via SUBCUTANEOUS
  Administered 2021-08-23: 5 [IU] via SUBCUTANEOUS
  Administered 2021-08-23: 3 [IU] via SUBCUTANEOUS
  Administered 2021-08-24: 2 [IU] via SUBCUTANEOUS
  Administered 2021-08-24 (×2): 5 [IU] via SUBCUTANEOUS
  Administered 2021-08-25: 3 [IU] via SUBCUTANEOUS
  Administered 2021-08-25: 1 [IU] via SUBCUTANEOUS
  Administered 2021-08-25 (×2): 5 [IU] via SUBCUTANEOUS
  Administered 2021-08-26: 1 [IU] via SUBCUTANEOUS
  Administered 2021-08-26: 2 [IU] via SUBCUTANEOUS
  Administered 2021-08-26 (×2): 1 [IU] via SUBCUTANEOUS
  Administered 2021-08-27: 2 [IU] via SUBCUTANEOUS
  Administered 2021-08-27: 1 [IU] via SUBCUTANEOUS
  Administered 2021-08-27 (×3): 2 [IU] via SUBCUTANEOUS
  Administered 2021-08-28: 7 [IU] via SUBCUTANEOUS
  Administered 2021-08-28: 1 [IU] via SUBCUTANEOUS
  Administered 2021-08-28: 7 [IU] via SUBCUTANEOUS
  Administered 2021-08-28: 5 [IU] via SUBCUTANEOUS
  Administered 2021-08-29: 3 [IU] via SUBCUTANEOUS
  Administered 2021-08-29: 2 [IU] via SUBCUTANEOUS
  Administered 2021-08-29: 5 [IU] via SUBCUTANEOUS
  Administered 2021-08-29: 3 [IU] via SUBCUTANEOUS
  Administered 2021-08-29 (×2): 2 [IU] via SUBCUTANEOUS
  Administered 2021-08-30: 1 [IU] via SUBCUTANEOUS
  Administered 2021-08-30: 3 [IU] via SUBCUTANEOUS
  Administered 2021-08-30: 5 [IU] via SUBCUTANEOUS
  Administered 2021-08-30: 3 [IU] via SUBCUTANEOUS
  Administered 2021-08-30: 1 [IU] via SUBCUTANEOUS
  Administered 2021-08-30: 5 [IU] via SUBCUTANEOUS
  Administered 2021-08-31: 2 [IU] via SUBCUTANEOUS
  Administered 2021-08-31: 3 [IU] via SUBCUTANEOUS
  Administered 2021-08-31: 1 [IU] via SUBCUTANEOUS
  Administered 2021-08-31: 5 [IU] via SUBCUTANEOUS

## 2021-08-19 NOTE — ED Provider Notes (Signed)
[facility] EMERGENCY DEPARTMENT Provider Note   CSN: [id number] Arrival date & time: 08/19/21  1800     History  Chief Complaint  Patient presents with   Rectal Bleeding    Charles [name] is a 61 y.o. male.  61 year old male presents with rectal bleeding x1 week.  Patient has been been noting bright red blood per rectum.  Initially thought it was mixed in stool but turns out is not.  He is not taking blood thinners.  Does note diffuse weakness with this.  Denies abdominal pain.  No emesis noted.  EMS called and patient transported here       Home Medications Prior to Admission medications   Medication Sig Start Date End Date Taking? Authorizing Provider  aspirin EC 81 MG tablet Take 81 mg by mouth daily. Swallow whole.    [provider]  blood glucose meter kit and supplies Dispense based on patient and insurance preference. Use up to four times daily as directed. (FOR ICD-10 E10.9, E11.9). 12/23/20   de [place], Blondell Reveal, MD  clotrimazole-betamethasone (LOTRISONE) cream Apply topically 2 (two) times daily. Apply to foreskin Patient taking differently: Apply 1 application. topically 2 (two) times daily. 04/25/21   Rai, Vernelle Emerald, MD  Continuous Blood Gluc Sensor (FREESTYLE LIBRE 2 SENSOR) MISC 3 Units by Does not apply route 3 (three) times daily with meals. 04/25/21   Rai, Vernelle Emerald, MD  ezetimibe (ZETIA) 10 MG tablet Take 1 tablet (10 mg total) by mouth daily. 07/07/21   Thurnell Lose, MD  gabapentin (NEURONTIN) 300 MG capsule Take 1 capsule (300 mg total) by mouth at bedtime. Patient taking differently: Take 300 mg by mouth daily. 04/13/21   Penumalli, Earlean Polka, MD  HUMALOG KWIKPEN 100 UNIT/ML KwikPen Before each meal 3 times a day, 140-199 - 2 units, 200-250 - 4 units, 251-299 - 6 units,  300-349 - 8 units,  350 or above 10 units. 07/07/21   Thurnell Lose, MD  Insulin Glargine [facility] KWIKPEN) 100 UNIT/ML Inject 14 Units into the skin daily.  07/07/21   Thurnell Lose, MD  metFORMIN (GLUCOPHAGE) 1000 MG tablet TAKE 1 TABLET (1,000 MG TOTAL) BY MOUTH 2 (TWO) TIMES DAILY WITH A MEAL. 05/15/21   de [place], Blondell Reveal, MD  midodrine (PROAMATINE) 10 MG tablet Take 1 tablet (10 mg total) by mouth 3 (three) times daily with meals. 07/07/21   Thurnell Lose, MD  oxybutynin (DITROPAN) 5 MG tablet Take 1 tablet (5 mg total) by mouth every 8 (eight) hours as needed for bladder spasms. 04/25/21   Rai, Ripudeep K, MD  pantoprazole (PROTONIX) 40 MG tablet Take 1 tablet (40 mg total) by mouth daily. Any generic PPI is okay to dispense Patient taking differently: Take 40 mg by mouth daily. 04/25/21   Rai, Vernelle Emerald, MD  QUEtiapine (SEROQUEL) 25 MG tablet Take 1 tablet (25 mg total) by mouth 2 (two) times daily. 07/07/21   Thurnell Lose, MD  rosuvastatin (CRESTOR) 40 MG tablet Take 1 tablet (40 mg total) by mouth daily. 04/25/21   Rai, Vernelle Emerald, MD  tamsulosin (FLOMAX) 0.4 MG CAPS capsule Take 1 capsule (0.4 mg total) by mouth daily. 07/08/21   Thurnell Lose, MD      Allergies    Patient has no known allergies.    Review of Systems   Review of Systems  All other systems reviewed and are negative.   Physical Exam Updated Vital Signs Temp 97.8  F (36.6 C) (Oral)   Ht 1.676 m ($Remove'5\' 6"'PaRUxFh$ )   Wt 40.8 kg   BMI 14.53 kg/m  Physical Exam Vitals and nursing note reviewed.  Constitutional:      General: He is not in acute distress.    Appearance: Normal appearance. He is well-developed. He is not toxic-appearing.  HENT:     Head: Normocephalic and atraumatic.  Eyes:     General: Lids are normal.     Conjunctiva/sclera: Conjunctivae normal.     Pupils: Pupils are equal, round, and reactive to light.  Neck:     Thyroid: No thyroid mass.     Trachea: No tracheal deviation.  Cardiovascular:     Rate and Rhythm: Normal rate and regular rhythm.     Heart sounds: Normal heart sounds. No murmur heard.    No gallop.  Pulmonary:     Effort:  Pulmonary effort is normal. No respiratory distress.     Breath sounds: Normal breath sounds. No stridor. No decreased breath sounds, wheezing, rhonchi or rales.  Abdominal:     General: There is no distension.     Palpations: Abdomen is soft.     Tenderness: There is no abdominal tenderness. There is no rebound.  Genitourinary:    Comments: Gross blood noted Musculoskeletal:        General: No tenderness. Normal range of motion.     Cervical back: Normal range of motion and neck supple.  Skin:    General: Skin is warm and dry.     Findings: No abrasion or rash.  Neurological:     Mental Status: He is alert and oriented to person, place, and time. Mental status is at baseline.     GCS: GCS eye subscore is 4. GCS verbal subscore is 5. GCS motor subscore is 6.     Cranial Nerves: No cranial nerve deficit.     Sensory: No sensory deficit.     Motor: Motor function is intact.  Psychiatric:        Attention and Perception: Attention normal.        Speech: Speech normal.        Behavior: Behavior normal.     ED Results / Procedures / Treatments   Labs (all labs ordered are listed, but only abnormal results are displayed) Labs Reviewed  CBC WITH DIFFERENTIAL/PLATELET  COMPREHENSIVE METABOLIC PANEL  TYPE AND SCREEN    EKG None  Radiology No results found.  Procedures Procedures    Medications Ordered in ED Medications  lactated ringers bolus 1,000 mL (has no administration in time range)    ED Course/ Medical Decision Making/ A&P                           Medical Decision Making Amount and/or Complexity of Data Reviewed Labs: ordered. Radiology: ordered.   Patient presented with rectal bleeding and has stable hemoglobin here.  Abdominal CT per my interpretation shows no signs of diverticulosis.  He is hemodynamically stable at this time.  Patient Dors is weakness and therefore plan will be to admit to the hospital at this time.  We will send secure chat to GI  on-call        Final Clinical Impression(s) / ED Diagnoses Final diagnoses:  None    Rx / DC Orders ED Discharge Orders     None         Lacretia Leigh, MD 08/19/21 2049

## 2021-08-19 NOTE — ED Triage Notes (Addendum)
Pt bib [facility] from [facility] for rectal bleeding x1 week. Per ems, facility NP's first dx was hemorrhoids but second NP thought that pt should come to ER. Denies pain. Denies blood thinners. Endorses dizziness and n/v.   BP 100/60, HR 100, RR 20

## 2021-08-19 NOTE — H&P (Signed)
History and Physical    Graylin Sperling WGY:[id number] DOB: March 29, 1960 DOA: 08/19/2021  PCP: Darreld Mclean, MD  Chief Complaint: Rectal bleeding  HPI: Charles [name] is a 61 y.o. male with medical history significant of insulin-dependent type 2 diabetes, hypotension on midodrine, CAD status post CABG, BPH/ bladder outflow obstruction with chronic indwelling Foley catheter, hyperlipidemia, GERD, cognitive decline/ ?dementia presented to ED complaining of rectal bleeding x1 week.  Blood pressure soft on arrival, remainder of vital signs stable.  Labs showing WBC 10.3, hemoglobin 10.5 (stable), platelet count 340k.  Sodium 135, potassium 4.1, chloride 101, bicarb 23, BUN 13, creatinine 0.7, glucose 256.  Gross blood noted on rectal exam.  CT without evidence of diverticulosis. Patient was given 1 L LR bolus.  ED physician discussed the case with Dr. Candis Schatz, GI will consult.  Patient reports 1 week history of rectal bleeding/BRBPR.  Denies rectal or abdominal pain.  He has felt nauseous but has not vomited.  Reports being told he had hemorrhoids back in the 1990s when he had heart surgery.  Does report history of rectal bleeding about 3 years ago.  Reports history of colonoscopy 3 years ago in [place] and was told that it was normal.  He takes aspirin but no other blood thinners.  Reports generalized weakness/fatigue and dizziness.  Denies shortness of breath or chest pain.  Review of Systems:  Review of Systems  All other systems reviewed and are negative.   Past Medical History:  Diagnosis Date   Arthritis    Diabetes mellitus without complication ([place])    Heart disease    Hypertension     Past Surgical History:  Procedure Laterality Date   CORONARY ARTERY BYPASS GRAFT       reports that he has never smoked. He has never used smokeless tobacco. He reports that he does not currently use alcohol. He reports that he does not use drugs.  No Known Allergies  Family History  Problem  Relation Age of Onset   Diabetes Mother    Hypertension Mother    Diabetes Father    Hypertension Father    Heart attack Father    Diabetes Sister    Diabetes Sister     Prior to Admission medications   Medication Sig Start Date End Date Taking? Authorizing Provider  aspirin EC 81 MG tablet Take 81 mg by mouth daily. Swallow whole.    [provider]  blood glucose meter kit and supplies Dispense based on patient and insurance preference. Use up to four times daily as directed. (FOR ICD-10 E10.9, E11.9). 12/23/20   de [place], Blondell Reveal, MD  clotrimazole-betamethasone (LOTRISONE) cream Apply topically 2 (two) times daily. Apply to foreskin Patient taking differently: Apply 1 application. topically 2 (two) times daily. 04/25/21   Rai, Vernelle Emerald, MD  Continuous Blood Gluc Sensor (FREESTYLE LIBRE 2 SENSOR) MISC 3 Units by Does not apply route 3 (three) times daily with meals. 04/25/21   Rai, Vernelle Emerald, MD  ezetimibe (ZETIA) 10 MG tablet Take 1 tablet (10 mg total) by mouth daily. 07/07/21   Thurnell Lose, MD  gabapentin (NEURONTIN) 300 MG capsule Take 1 capsule (300 mg total) by mouth at bedtime. Patient taking differently: Take 300 mg by mouth daily. 04/13/21   Penumalli, Earlean Polka, MD  HUMALOG KWIKPEN 100 UNIT/ML KwikPen Before each meal 3 times a day, 140-199 - 2 units, 200-250 - 4 units, 251-299 - 6 units,  300-349 - 8 units,  350 or above 10  units. 07/07/21   Thurnell Lose, MD  Insulin Glargine [facility] KWIKPEN) 100 UNIT/ML Inject 14 Units into the skin daily. 07/07/21   Thurnell Lose, MD  metFORMIN (GLUCOPHAGE) 1000 MG tablet TAKE 1 TABLET (1,000 MG TOTAL) BY MOUTH 2 (TWO) TIMES DAILY WITH A MEAL. 05/15/21   de [place], Blondell Reveal, MD  midodrine (PROAMATINE) 10 MG tablet Take 1 tablet (10 mg total) by mouth 3 (three) times daily with meals. Patient taking differently: Take 10 mg by mouth daily as needed (low blood pressure). 07/07/21   Thurnell Lose, MD  oxybutynin (DITROPAN) 5  MG tablet Take 1 tablet (5 mg total) by mouth every 8 (eight) hours as needed for bladder spasms. 04/25/21   Rai, Ripudeep K, MD  pantoprazole (PROTONIX) 40 MG tablet Take 1 tablet (40 mg total) by mouth daily. Any generic PPI is okay to dispense Patient taking differently: Take 40 mg by mouth daily. 04/25/21   Rai, Vernelle Emerald, MD  QUEtiapine (SEROQUEL) 25 MG tablet Take 1 tablet (25 mg total) by mouth 2 (two) times daily. 07/07/21   Thurnell Lose, MD  rosuvastatin (CRESTOR) 40 MG tablet Take 1 tablet (40 mg total) by mouth daily. 04/25/21   Rai, Vernelle Emerald, MD  tamsulosin (FLOMAX) 0.4 MG CAPS capsule Take 1 capsule (0.4 mg total) by mouth daily. 07/08/21   Thurnell Lose, MD    Physical Exam: Vitals:   08/19/21 1838 08/19/21 1915 08/19/21 2015 08/19/21 2045  BP: (!) 92/57 120/78 105/70 105/61  Pulse: 90 90 89 78  Resp: _0 Temp:      TempSrc:      SpO2: 100% 94% 100% 100%  Weight:      Height:        Physical Exam Vitals reviewed.  Constitutional:      General: He is not in acute distress. HENT:     Head: Normocephalic and atraumatic.  Eyes:     Extraocular Movements: Extraocular movements intact.  Cardiovascular:     Rate and Rhythm: Normal rate and regular rhythm.     Pulses: Normal pulses.  Pulmonary:     Effort: Pulmonary effort is normal. No respiratory distress.     Breath sounds: Normal breath sounds. No wheezing or rales.  Abdominal:     General: Bowel sounds are normal. There is no distension.     Palpations: Abdomen is soft.     Tenderness: There is no abdominal tenderness.  Musculoskeletal:        General: No swelling or tenderness.     Cervical back: Normal range of motion.  Skin:    General: Skin is warm and dry.  Neurological:     General: No focal deficit present.     Mental Status: He is alert and oriented to person, place, and time.      Labs on Admission: I have personally reviewed following labs and imaging studies  CBC: Recent Labs   Lab 08/19/21 1820  WBC 10.3  NEUTROABS 8.2*  HGB 10.5*  HCT 32.0*  MCV 89.1  PLT 832   Basic Metabolic Panel: Recent Labs  Lab 08/19/21 1820  NA 135  K 4.1  CL 101  CO2 23  GLUCOSE 256*  BUN 13  CREATININE 0.75  CALCIUM 8.7*   GFR: Estimated Creatinine Clearance: 58.1 mL/min (by C-G formula based on SCr of 0.75 mg/dL). Liver Function Tests: Recent Labs  Lab 08/19/21 1820  AST 21  ALT 13  ALKPHOS  102  BILITOT 0.4  PROT 6.5  ALBUMIN 2.5*   No results for input(s): "LIPASE", "AMYLASE" in the last 168 hours. No results for input(s): "AMMONIA" in the last 168 hours. Coagulation Profile: No results for input(s): "INR", "PROTIME" in the last 168 hours. Cardiac Enzymes: No results for input(s): "CKTOTAL", "CKMB", "CKMBINDEX", "TROPONINI" in the last 168 hours. BNP (last 3 results) No results for input(s): "PROBNP" in the last 8760 hours. HbA1C: No results for input(s): "HGBA1C" in the last 72 hours. CBG: No results for input(s): "GLUCAP" in the last 168 hours. Lipid Profile: No results for input(s): "CHOL", "HDL", "LDLCALC", "TRIG", "CHOLHDL", "LDLDIRECT" in the last 72 hours. Thyroid Function Tests: No results for input(s): "TSH", "T4TOTAL", "FREET4", "T3FREE", "THYROIDAB" in the last 72 hours. Anemia Panel: No results for input(s): "VITAMINB12", "FOLATE", "FERRITIN", "TIBC", "IRON", "RETICCTPCT" in the last 72 hours. Urine analysis:    Component Value Date/Time   COLORURINE YELLOW 06/30/2021 2023   APPEARANCEUR CLOUDY (A) 06/30/2021 2023   APPEARANCEUR Hazy (A) 05/09/2021 1006   LABSPEC 1.010 06/30/2021 2023   PHURINE 5.0 06/30/2021 2023   GLUCOSEU >=500 (A) 06/30/2021 2023   HGBUR MODERATE (A) 06/30/2021 2023   BILIRUBINUR NEGATIVE 06/30/2021 2023   BILIRUBINUR Negative 05/09/2021 1006   KETONESUR 80 (A) 06/30/2021 2023   PROTEINUR NEGATIVE 06/30/2021 2023   NITRITE POSITIVE (A) 06/30/2021 2023   LEUKOCYTESUR LARGE (A) 06/30/2021 2023     Radiological Exams on Admission: I have personally reviewed images CT Abdomen Pelvis Wo Contrast  Result Date: 08/19/2021 CLINICAL DATA:  Abdominal pain EXAM: CT ABDOMEN AND PELVIS WITHOUT CONTRAST TECHNIQUE: Multidetector CT imaging of the abdomen and pelvis was performed following the standard protocol without IV contrast. RADIATION DOSE REDUCTION: This exam was performed according to the departmental dose-optimization program which includes automated exposure control, adjustment of the mA and/or kV according to patient size and/or use of iterative reconstruction technique. COMPARISON:  07/03/2021 FINDINGS: Lower Chest: Normal. Hepatobiliary: Normal hepatic contours. No intra- or extrahepatic biliary dilatation. The gallbladder is normal. Pancreas: Normal pancreas. No ductal dilatation or peripancreatic fluid collection. Spleen: Normal. Adrenals/Urinary Tract: The adrenal glands are normal. No hydronephrosis, nephroureterolithiasis or solid renal mass. Decompressed by Foley catheter. Stomach/Bowel: There is no hiatal hernia. Normal duodenal course and caliber. No small bowel dilatation or inflammation. No focal colonic abnormality. Normal appendix. Vascular/Lymphatic: There is calcific atherosclerosis of the abdominal aorta. No lymphadenopathy. Reproductive: Normal prostate size with symmetric seminal vesicles. Other: None. Musculoskeletal: No bony spinal canal stenosis or focal osseous abnormality. IMPRESSION: 1. No acute abnormality of the abdomen or pelvis. 2. Aortic Atherosclerosis (ICD10-I70.0). Electronically Signed   By: Ulyses Jarred M.D.   On: 08/19/2021 19:23    Assessment and Plan  Acute lower GI bleed Patient presenting with painless rectal bleeding x1 week.  Hemoglobin 10.5, stable.  Gross blood noted on rectal exam done in the ED.  CT without evidence of diverticulosis.  No prior colonoscopy results in the chart.  He reports history of hemorrhoids.  Takes aspirin.  Currently  hemodynamically stable. -GI consulted -Keep n.p.o. after midnight -IV fluid hydration -Hold home aspirin -Type and screen -Serial H&H -Transfuse PRBCs if hemoglobin less than 8 given CAD.  Patient has given verbal consent for blood transfusion.  Insulin-dependent type 2 diabetes Poorly controlled -glucose in the 200s.  A1c 10.4 on 07/03/2021. -Pharmacy med rec pending -Sensitive sliding scale insulin every 4 hours  Chronic hypotension Blood pressure currently stable. -Continue home midodrine after pharmacy med rec is done.  CAD status post CABG -Hold aspirin -Continue Crestor and Zetia after pharmacy med rec is done.  BPH/ bladder outflow obstruction with chronic indwelling Foley catheter -Continue Foley catheter -Continue Flomax after pharmacy med rec is done  Hyperlipidemia -Continue home meds after pharmacy med rec is done.  DVT prophylaxis: SCDs Code Status: Full Code (discussed with the patient) Family Communication: No family available at this time. Consults called: GI Level of care: Telemetry bed Admission status: It is my clinical opinion that admission to INPATIENT is reasonable and necessary because of the expectation that this patient will require hospital care that crosses at least 2 midnights to treat this condition based on the medical complexity of the problems presented.  Given the aforementioned information, the predictability of an adverse outcome is felt to be significant.   Shela Leff MD [facility]  If 7PM-7AM, please contact night-coverage [email or web address]  08/19/2021, 9:53 PM

## 2021-08-20 DIAGNOSIS — E119 Type 2 diabetes mellitus without complications: Secondary | ICD-10-CM | POA: Diagnosis not present

## 2021-08-20 DIAGNOSIS — D62 Acute posthemorrhagic anemia: Secondary | ICD-10-CM

## 2021-08-20 DIAGNOSIS — Z794 Long term (current) use of insulin: Secondary | ICD-10-CM

## 2021-08-20 DIAGNOSIS — I2510 Atherosclerotic heart disease of native coronary artery without angina pectoris: Secondary | ICD-10-CM

## 2021-08-20 DIAGNOSIS — R5381 Other malaise: Secondary | ICD-10-CM | POA: Diagnosis not present

## 2021-08-20 DIAGNOSIS — I959 Hypotension, unspecified: Secondary | ICD-10-CM

## 2021-08-20 DIAGNOSIS — N138 Other obstructive and reflux uropathy: Secondary | ICD-10-CM

## 2021-08-20 DIAGNOSIS — N401 Enlarged prostate with lower urinary tract symptoms: Secondary | ICD-10-CM

## 2021-08-20 DIAGNOSIS — K922 Gastrointestinal hemorrhage, unspecified: Secondary | ICD-10-CM | POA: Diagnosis not present

## 2021-08-20 DIAGNOSIS — E43 Unspecified severe protein-calorie malnutrition: Secondary | ICD-10-CM

## 2021-08-20 DIAGNOSIS — I951 Orthostatic hypotension: Secondary | ICD-10-CM

## 2021-08-20 LAB — CBC
HCT: 23.6 % — ABNORMAL LOW (ref 39.0–52.0)
HCT: 23.9 % — ABNORMAL LOW (ref 39.0–52.0)
Hemoglobin: 7.8 g/dL — ABNORMAL LOW (ref 13.0–17.0)
Hemoglobin: 7.7 g/dL — ABNORMAL LOW (ref 13.0–17.0)
MCH: 29.1 pg (ref 26.0–34.0)
MCH: 28.2 pg (ref 26.0–34.0)
MCHC: 33.1 g/dL (ref 30.0–36.0)
MCHC: 32.2 g/dL (ref 30.0–36.0)
MCV: 88.1 fL (ref 80.0–100.0)
MCV: 87.5 fL (ref 80.0–100.0)
Platelets: 293 10*3/uL (ref 150–400)
Platelets: 333 10*3/uL (ref 150–400)
RBC: 2.68 MIL/uL — ABNORMAL LOW (ref 4.22–5.81)
RBC: 2.73 MIL/uL — ABNORMAL LOW (ref 4.22–5.81)
RDW: 14.4 % (ref 11.5–15.5)
RDW: 14.3 % (ref 11.5–15.5)
WBC: 9.9 10*3/uL (ref 4.0–10.5)
WBC: 11 10*3/uL — ABNORMAL HIGH (ref 4.0–10.5)
nRBC: 0 % (ref 0.0–0.2)
nRBC: 0 % (ref 0.0–0.2)

## 2021-08-20 LAB — CBG MONITORING, ED
Glucose-Capillary: 285 mg/dL — ABNORMAL HIGH (ref 70–99)
Glucose-Capillary: 159 mg/dL — ABNORMAL HIGH (ref 70–99)
Glucose-Capillary: 167 mg/dL — ABNORMAL HIGH (ref 70–99)

## 2021-08-20 LAB — GLUCOSE, CAPILLARY
Glucose-Capillary: 106 mg/dL — ABNORMAL HIGH (ref 70–99)
Glucose-Capillary: 133 mg/dL — ABNORMAL HIGH (ref 70–99)
Glucose-Capillary: 146 mg/dL — ABNORMAL HIGH (ref 70–99)
Glucose-Capillary: 88 mg/dL (ref 70–99)

## 2021-08-20 LAB — RETICULOCYTES
Immature Retic Fract: 17.5 % — ABNORMAL HIGH (ref 2.3–15.9)
RBC.: 2.69 MIL/uL — ABNORMAL LOW (ref 4.22–5.81)
Retic Count, Absolute: 45.2 10*3/uL (ref 19.0–186.0)
Retic Ct Pct: 1.7 % (ref 0.4–3.1)

## 2021-08-20 LAB — IRON AND TIBC
Iron: 26 ug/dL — ABNORMAL LOW (ref 45–182)
Saturation Ratios: 12 % — ABNORMAL LOW (ref 17.9–39.5)
TIBC: 209 ug/dL — ABNORMAL LOW (ref 250–450)
UIBC: 183 ug/dL

## 2021-08-20 LAB — VITAMIN B12: Vitamin B-12: 350 pg/mL (ref 180–914)

## 2021-08-20 LAB — FERRITIN: Ferritin: 290 ng/mL (ref 24–336)

## 2021-08-20 LAB — RENAL FUNCTION PANEL
Albumin: 2.1 g/dL — ABNORMAL LOW (ref 3.5–5.0)
Anion gap: 9 (ref 5–15)
BUN: 10 mg/dL (ref 6–20)
CO2: 23 mmol/L (ref 22–32)
Calcium: 8.1 mg/dL — ABNORMAL LOW (ref 8.9–10.3)
Chloride: 105 mmol/L (ref 98–111)
Creatinine, Ser: 0.72 mg/dL (ref 0.61–1.24)
GFR, Estimated: >60 mL/min (ref >60)
Glucose, Bld: 170 mg/dL — ABNORMAL HIGH (ref 70–99)
Phosphorus: 3 mg/dL (ref 2.5–4.6)
Potassium: 3.6 mmol/L (ref 3.5–5.1)
Sodium: 137 mmol/L (ref 135–145)

## 2021-08-20 LAB — CORTISOL: Cortisol, Plasma: 15.4 ug/dL

## 2021-08-20 LAB — TSH: TSH: 3.208 u[IU]/mL (ref 0.350–4.500)

## 2021-08-20 LAB — ABO/RH: ABO/RH(D): A POS

## 2021-08-20 LAB — MAGNESIUM: Magnesium: 1.2 mg/dL — ABNORMAL LOW (ref 1.7–2.4)

## 2021-08-20 LAB — PREPARE RBC (CROSSMATCH)

## 2021-08-20 LAB — HEMOGLOBIN AND HEMATOCRIT, BLOOD
HCT: 25.7 % — ABNORMAL LOW (ref 39.0–52.0)
HCT: 24.8 % — ABNORMAL LOW (ref 39.0–52.0)
Hemoglobin: 8.9 g/dL — ABNORMAL LOW (ref 13.0–17.0)
Hemoglobin: 8.1 g/dL — ABNORMAL LOW (ref 13.0–17.0)

## 2021-08-20 LAB — FOLATE: Folate: 6.4 ng/mL (ref >5.9)

## 2021-08-20 MED ORDER — SODIUM CHLORIDE 0.9% IV SOLUTION: Freq: Once | INTRAVENOUS | Status: AC

## 2021-08-20 MED ORDER — SODIUM CHLORIDE 0.9 % IV SOLN
INTRAVENOUS | Status: DC
Start: 2021-08-21 — End: 2021-08-21

## 2021-08-20 MED ORDER — ORAL CARE MOUTH RINSE
15.0000 mL | Freq: Two times a day (BID) | OROMUCOSAL | Status: DC
  Administered 2021-08-20 – 2021-08-31 (×23): 15 mL via OROMUCOSAL

## 2021-08-20 MED ORDER — CHLORHEXIDINE GLUCONATE CLOTH 2 % EX PADS
6.0000 | MEDICATED_PAD | Freq: Every day | CUTANEOUS | Status: DC
  Administered 2021-08-20 – 2021-08-31 (×11): 6 via TOPICAL

## 2021-08-20 MED ORDER — PEG 3350-KCL-NABCB-NACL-NASULF 236 G PO SOLR
4000.0000 mL | Freq: Once | ORAL | Status: AC
  Administered 2021-08-20: 4000 mL via ORAL
  Filled 2021-08-20: qty 4000

## 2021-08-20 MED ORDER — BOOST / RESOURCE BREEZE PO LIQD CUSTOM
1.0000 | Freq: Three times a day (TID) | ORAL | Status: DC
  Administered 2021-08-21 – 2021-08-22 (×3): 1 via ORAL

## 2021-08-20 MED ORDER — ROSUVASTATIN CALCIUM 20 MG PO TABS
40.0000 mg | ORAL_TABLET | Freq: Every day | ORAL | Status: DC
  Administered 2021-08-20 – 2021-08-22 (×3): 40 mg via ORAL
  Filled 2021-08-20 (×3): qty 2

## 2021-08-20 MED ORDER — MIRTAZAPINE 15 MG PO TABS
7.5000 mg | ORAL_TABLET | Freq: Every day | ORAL | Status: DC
Start: 2021-08-20 — End: 2021-08-22
  Administered 2021-08-20 – 2021-08-21 (×2): 7.5 mg via ORAL
  Filled 2021-08-20 (×2): qty 1

## 2021-08-20 MED ORDER — MIDODRINE HCL 5 MG PO TABS
10.0000 mg | ORAL_TABLET | Freq: Three times a day (TID) | ORAL | Status: DC
  Administered 2021-08-20 – 2021-08-31 (×35): 10 mg via ORAL
  Filled 2021-08-20 (×35): qty 2

## 2021-08-20 MED ORDER — SODIUM CHLORIDE 0.9 % IV SOLN: INTRAVENOUS | Status: DC

## 2021-08-20 MED ORDER — EZETIMIBE 10 MG PO TABS
10.0000 mg | ORAL_TABLET | Freq: Every day | ORAL | Status: DC
  Administered 2021-08-20 – 2021-08-22 (×3): 10 mg via ORAL
  Filled 2021-08-20 (×3): qty 1

## 2021-08-20 MED ORDER — PANTOPRAZOLE SODIUM 40 MG PO TBEC
40.0000 mg | DELAYED_RELEASE_TABLET | Freq: Every day | ORAL | Status: DC
  Administered 2021-08-20 – 2021-08-21 (×2): 40 mg via ORAL
  Filled 2021-08-20 (×2): qty 1

## 2021-08-20 MED ORDER — QUETIAPINE FUMARATE 25 MG PO TABS
25.0000 mg | ORAL_TABLET | Freq: Every day | ORAL | Status: DC
  Administered 2021-08-20 – 2021-08-21 (×2): 25 mg via ORAL
  Filled 2021-08-20 (×2): qty 1

## 2021-08-20 MED ORDER — PEG 3350-KCL-NABCB-NACL-NASULF 236 G PO SOLR
4000.0000 mL | Freq: Once | ORAL | Status: AC
  Administered 2021-08-21: 4000 mL via ORAL
  Filled 2021-08-20: qty 4000

## 2021-08-20 MED ORDER — MAGNESIUM SULFATE 4 GM/100ML IV SOLN
4.0000 g | Freq: Once | INTRAVENOUS | Status: AC
  Administered 2021-08-20: 4 g via INTRAVENOUS
  Filled 2021-08-20 (×2): qty 100

## 2021-08-20 MED ORDER — PEG-KCL-NACL-NASULF-NA ASC-C 100 G PO SOLR
1.0000 | Freq: Once | ORAL | Status: DC
Start: 2021-08-20 — End: 2021-08-20

## 2021-08-20 MED ORDER — SODIUM CHLORIDE 0.9 % IV BOLUS
1000.0000 mL | Freq: Once | INTRAVENOUS | Status: AC
  Administered 2021-08-20: 1000 mL via INTRAVENOUS

## 2021-08-20 MED ORDER — GABAPENTIN 300 MG PO CAPS
300.0000 mg | ORAL_CAPSULE | Freq: Every day | ORAL | Status: DC
  Administered 2021-08-20 – 2021-08-21 (×2): 300 mg via ORAL
  Filled 2021-08-20 (×2): qty 1

## 2021-08-20 NOTE — H&P (View-Only) (Signed)
Referring Provider: EDP Primary Care Physician:  Darreld Mclean, MD Primary Gastroenterologist:  Dr. Candis Schatz  Reason for Consultation:  GI bleed  HPI: Charles [name] is a 61 y.o. male with a history of coronary artery disease status post CABG and poorly controlled diabetes with Hgb A1C 10 most recently, ? Cognitive decline and some early dementia.  He presented to [facility] ED with complaints of rectal bleeding.  He is somewhat confused at times but says that this started on Thursday and was "quite a lot of blood".  No further bleeding since yesterday, 6/9.  Hemoglobin 7.8 g down from about 10.5 g earlier this year.  CT scan of the abdomen pelvis without contrast was unremarkable.  Of note, he was seen in our office back in November 2022 with several GI complaints.  He reported having 2 colonoscopies in the past, remotely (likely had EGDs as well but we did not have any records).  He was also having weight loss.  At that time his hemoglobin A1c was greater than 15.5 and he was going to be undergoing some cardiac evaluation.  Plan was to consider EGD and colonoscopy after his cardiac work-up.  He never followed up after that.  He tells me that his nephew was taking him to his appts and he went back to [place] so now he does not have any transportation.  He tells me that he has lost probably around 100 pounds over the past year.  Denies nausea, vomiting, abdominal pain.  Past Medical History:  Diagnosis Date   Arthritis    Diabetes mellitus without complication ([place])    Heart disease    Hypertension     Past Surgical History:  Procedure Laterality Date   CORONARY ARTERY BYPASS GRAFT      Prior to Admission medications   Medication Sig Start Date End Date Taking? Authorizing Provider  aspirin EC 81 MG tablet Take 81 mg by mouth daily. Swallow whole.   Yes [provider]  clotrimazole-betamethasone (LOTRISONE) cream Apply topically 2 (two) times daily. Apply to  foreskin Patient taking differently: Apply 1 application  topically 2 (two) times daily. 04/25/21  Yes Rai, Ripudeep K, MD  docusate sodium (COLACE) 100 MG capsule Take 100 mg by mouth at bedtime.   Yes [provider]  ezetimibe (ZETIA) 10 MG tablet Take 1 tablet (10 mg total) by mouth daily. 07/07/21  Yes Thurnell Lose, MD  gabapentin (NEURONTIN) 300 MG capsule Take 1 capsule (300 mg total) by mouth at bedtime. 04/13/21  Yes Penumalli, Earlean Polka, MD  HUMALOG KWIKPEN 100 UNIT/ML KwikPen Before each meal 3 times a day, 140-199 - 2 units, 200-250 - 4 units, 251-299 - 6 units,  300-349 - 8 units,  350 or above 10 units. Patient taking differently: Inject 2-10 Units into the skin See admin instructions. Before each meal 3 times a day, 140-199 - 2 units, 200-250 - 4 units, 251-299 - 6 units,  300-349 - 8 units,  350 or above 10 units. Call MD <70 or >350 07/07/21  Yes Thurnell Lose, MD  Insulin Glargine (BASAGLAR KWIKPEN) 100 UNIT/ML Inject 14 Units into the skin daily. Patient taking differently: Inject 12 Units into the skin at bedtime. 07/07/21  Yes Thurnell Lose, MD  Lido-PE-Glycerin-Petrolatum (PREPARATION H RAPID RELIEF) 5-0.25-14.4-15 % CREA Place 1 Application rectally every 6 (six) hours as needed (hemorrhoids).   Yes [provider]  metFORMIN (GLUCOPHAGE) 1000 MG tablet TAKE 1 TABLET (1,000 MG TOTAL) BY  MOUTH 2 (TWO) TIMES DAILY WITH A MEAL. 05/15/21  Yes de [place], Raymond J, MD  midodrine (PROAMATINE) 10 MG tablet Take 1 tablet (10 mg total) by mouth 3 (three) times daily with meals. Patient taking differently: Take 10 mg by mouth daily. 07/07/21  Yes Thurnell Lose, MD  mirtazapine (REMERON) 7.5 MG tablet Take 7.5 mg by mouth at bedtime.   Yes [provider]  oxybutynin (DITROPAN) 5 MG tablet Take 1 tablet (5 mg total) by mouth every 8 (eight) hours as needed for bladder spasms. Patient taking differently: Take 5 mg by mouth daily. 04/25/21  Yes Rai, Ripudeep K,  MD  pantoprazole (PROTONIX) 40 MG tablet Take 1 tablet (40 mg total) by mouth daily. Any generic PPI is okay to dispense Patient taking differently: Take 40 mg by mouth daily. 04/25/21  Yes Rai, Ripudeep K, MD  polyethylene glycol (MIRALAX / GLYCOLAX) 17 g packet Take 17 g by mouth daily.   Yes [provider]  QUEtiapine (SEROQUEL) 25 MG tablet Take 1 tablet (25 mg total) by mouth 2 (two) times daily. 07/07/21  Yes Thurnell Lose, MD  rosuvastatin (CRESTOR) 40 MG tablet Take 1 tablet (40 mg total) by mouth daily. 04/25/21  Yes Rai, Ripudeep K, MD  tamsulosin (FLOMAX) 0.4 MG CAPS capsule Take 1 capsule (0.4 mg total) by mouth daily. 07/08/21  Yes Thurnell Lose, MD  blood glucose meter kit and supplies Dispense based on patient and insurance preference. Use up to four times daily as directed. (FOR ICD-10 E10.9, E11.9). 12/23/20   de [place], Raymond J, MD  Continuous Blood Gluc Sensor (FREESTYLE LIBRE 2 SENSOR) MISC 3 Units by Does not apply route 3 (three) times daily with meals. 04/25/21   Mendel Corning, MD    Current Facility-Administered Medications  Medication Dose Route Frequency Provider Last Rate Last Admin   acetaminophen (TYLENOL) tablet 650 mg  650 mg Oral Q6H PRN Shela Leff, MD       Or   acetaminophen (TYLENOL) suppository 650 mg  650 mg Rectal Q6H PRN Shela Leff, MD       insulin aspart (novoLOG) injection 0-9 Units  0-9 Units Subcutaneous Q4H Shela Leff, MD   2 Units at 08/20/21 0750   magnesium sulfate IVPB 4 g 100 mL  4 g Intravenous Once Wendee Beavers T, MD       midodrine (PROAMATINE) tablet 10 mg  10 mg Oral TID WC Wendee Beavers T, MD   10 mg at 08/20/21 0754   Current Outpatient Medications  Medication Sig Dispense Refill   aspirin EC 81 MG tablet Take 81 mg by mouth daily. Swallow whole.     clotrimazole-betamethasone (LOTRISONE) cream Apply topically 2 (two) times daily. Apply to foreskin (Patient taking differently: Apply 1 application   topically 2 (two) times daily.) 45 g 4   docusate sodium (COLACE) 100 MG capsule Take 100 mg by mouth at bedtime.     ezetimibe (ZETIA) 10 MG tablet Take 1 tablet (10 mg total) by mouth daily.     gabapentin (NEURONTIN) 300 MG capsule Take 1 capsule (300 mg total) by mouth at bedtime. 30 capsule 3   HUMALOG KWIKPEN 100 UNIT/ML KwikPen Before each meal 3 times a day, 140-199 - 2 units, 200-250 - 4 units, 251-299 - 6 units,  300-349 - 8 units,  350 or above 10 units. (Patient taking differently: Inject 2-10 Units into the skin See admin instructions. Before each meal 3 times a day,  140-199 - 2 units, 200-250 - 4 units, 251-299 - 6 units,  300-349 - 8 units,  350 or above 10 units. Call MD <70 or >350) 15 mL 0   Insulin Glargine (BASAGLAR KWIKPEN) 100 UNIT/ML Inject 14 Units into the skin daily. (Patient taking differently: Inject 12 Units into the skin at bedtime.) 15 mL 0   Lido-PE-Glycerin-Petrolatum (PREPARATION H RAPID RELIEF) 5-0.25-14.4-15 % CREA Place 1 Application rectally every 6 (six) hours as needed (hemorrhoids).     metFORMIN (GLUCOPHAGE) 1000 MG tablet TAKE 1 TABLET (1,000 MG TOTAL) BY MOUTH 2 (TWO) TIMES DAILY WITH A MEAL. 60 tablet 0   midodrine (PROAMATINE) 10 MG tablet Take 1 tablet (10 mg total) by mouth 3 (three) times daily with meals. (Patient taking differently: Take 10 mg by mouth daily.)     mirtazapine (REMERON) 7.5 MG tablet Take 7.5 mg by mouth at bedtime.     oxybutynin (DITROPAN) 5 MG tablet Take 1 tablet (5 mg total) by mouth every 8 (eight) hours as needed for bladder spasms. (Patient taking differently: Take 5 mg by mouth daily.) 90 tablet 1   pantoprazole (PROTONIX) 40 MG tablet Take 1 tablet (40 mg total) by mouth daily. Any generic PPI is okay to dispense (Patient taking differently: Take 40 mg by mouth daily.) 30 tablet 1   polyethylene glycol (MIRALAX / GLYCOLAX) 17 g packet Take 17 g by mouth daily.     QUEtiapine (SEROQUEL) 25 MG tablet Take 1 tablet (25 mg total)  by mouth 2 (two) times daily.     rosuvastatin (CRESTOR) 40 MG tablet Take 1 tablet (40 mg total) by mouth daily. 30 tablet 3   tamsulosin (FLOMAX) 0.4 MG CAPS capsule Take 1 capsule (0.4 mg total) by mouth daily. 30 capsule    blood glucose meter kit and supplies Dispense based on patient and insurance preference. Use up to four times daily as directed. (FOR ICD-10 E10.9, E11.9). 1 each 11   Continuous Blood Gluc Sensor (FREESTYLE LIBRE 2 SENSOR) MISC 3 Units by Does not apply route 3 (three) times daily with meals. 1 each 3    Allergies as of 08/19/2021   (No Known Allergies)    Family History  Problem Relation Age of Onset   Diabetes Mother    Hypertension Mother    Diabetes Father    Hypertension Father    Heart attack Father    Diabetes Sister    Diabetes Sister     Social History   Socioeconomic History   Marital status: Single    Spouse name: Not on file   Number of children: 0   Years of education: masters   Highest education level: Not on file  Occupational History   Occupation: semi retired/technology  Tobacco Use   Smoking status: Never   Smokeless tobacco: Never  Vaping Use   Vaping Use: Never used  Substance and Sexual Activity   Alcohol use: Not Currently    Comment: none in 6 months may open a bottle of wine tonight 11/1112022   Drug use: Never   Sexual activity: Not on file  Other Topics Concern   Not on file  Social History Narrative   Right handed-    Caffeine - 3 cups per day   Resides at [facility] on [address]    Social Determinants of Health   Financial Resource Strain: Not on file  Food Insecurity: Not on file  Transportation Needs: Not on file  Physical Activity: Not  on file  Stress: Not on file  Social Connections: Not on file  Intimate Partner Violence: Not on file    Review of Systems: ROS is O/W negative except as mentioned in HPI.  Physical Exam: Vital signs in last 24 hours: Temp:  [97.8 F (36.6 C)] 97.8 F  (36.6 C) (06/09 1806) Pulse Rate:  [72-92] 72 (06/10 0830) Resp:  [12-29] 21 (06/10 0830) BP: (86-120)/(49-78) 99/63 (06/10 0830) SpO2:  [94 %-100 %] 100 % (06/10 0830) Weight:  [40.8 kg] 40.8 kg (06/09 1806)   General:  Alert, thin, pleasant and cooperative in NAD Head:  Normocephalic and atraumatic. Eyes:  Sclera clear, no icterus.  Conjunctiva pink. Ears:  Normal auditory acuity. Mouth:  No deformity or lesions.  Lungs:  Clear throughout to auscultation.  No wheezes, crackles, or rhonchi.  Heart:  Regular rate and rhythm; no murmurs, clicks, rubs, or gallops. Abdomen:  Soft, non-distended.  BS present.  Non-tender.   Msk:  Symmetrical without gross deformities. Pulses:  Normal pulses noted. Extremities:  Without clubbing or edema. Neurologic:  Alert and oriented x 4;  grossly normal neurologically.  Seems confused at times. Skin:  Intact without significant lesions or rashes. Psych:  Alert and cooperative. Normal mood and affect.  Intake/Output from previous day: 06/09 0701 - 06/10 0700 In: 1000 [I.V.:1000] Out: -   Lab Results: Recent Labs    08/19/21 1820 08/20/21 0050 08/20/21 0237 08/20/21 0855  WBC 10.3  --   --  9.9  HGB 10.5* 8.9* 8.1* 7.8*  HCT 32.0* 25.7* 24.8* 23.6*  PLT 340  --   --  293   BMET Recent Labs    08/19/21 1820 08/20/21 0646  NA 135 137  K 4.1 3.6  CL 101 105  CO2 23 23  GLUCOSE 256* 170*  BUN 13 10  CREATININE 0.75 0.72  CALCIUM 8.7* 8.1*   LFT Recent Labs    08/19/21 1820 08/20/21 0646  PROT 6.5  --   ALBUMIN 2.5* 2.1*  AST 21  --   ALT 13  --   ALKPHOS 102  --   BILITOT 0.4  --    Studies/Results: CT Abdomen Pelvis Wo Contrast  Result Date: 08/19/2021 CLINICAL DATA:  Abdominal pain EXAM: CT ABDOMEN AND PELVIS WITHOUT CONTRAST TECHNIQUE: Multidetector CT imaging of the abdomen and pelvis was performed following the standard protocol without IV contrast. RADIATION DOSE REDUCTION: This exam was performed according to the  departmental dose-optimization program which includes automated exposure control, adjustment of the mA and/or kV according to patient size and/or use of iterative reconstruction technique. COMPARISON:  07/03/2021 FINDINGS: Lower Chest: Normal. Hepatobiliary: Normal hepatic contours. No intra- or extrahepatic biliary dilatation. The gallbladder is normal. Pancreas: Normal pancreas. No ductal dilatation or peripancreatic fluid collection. Spleen: Normal. Adrenals/Urinary Tract: The adrenal glands are normal. No hydronephrosis, nephroureterolithiasis or solid renal mass. Decompressed by Foley catheter. Stomach/Bowel: There is no hiatal hernia. Normal duodenal course and caliber. No small bowel dilatation or inflammation. No focal colonic abnormality. Normal appendix. Vascular/Lymphatic: There is calcific atherosclerosis of the abdominal aorta. No lymphadenopathy. Reproductive: Normal prostate size with symmetric seminal vesicles. Other: None. Musculoskeletal: No bony spinal canal stenosis or focal osseous abnormality. IMPRESSION: 1. No acute abnormality of the abdomen or pelvis. 2. Aortic Atherosclerosis (ICD10-I70.0). Electronically Signed   By: Ulyses Jarred M.D.   On: 08/19/2021 19:23    IMPRESSION:  *Rectal bleeding: Sounds lower in origin such as diverticular, etc, but will rule out upper as  well. *Acute blood loss anemia: Hemoglobin about 3 g down from earlier this year.  7.8 grams here.  No further bleeding since yesterday 6/9. *Weight loss:  He estimates 100 pound weight loss over the past year or so.  Non-contrast CT scan of the abdomen and pelvis was unremarkable.  PLAN: -We will plan for EGD and colonoscopy on 6/11. -Monitor hemoglobin and transfuse if needed. -If re-bleeds briskly may need a CTA to look at vasculature and rule out bleeding source.  Laban Emperor. Zehr  08/20/2021, 11:22 AM  I have taken a history, reviewed the chart and examined the patient. I performed a substantive portion of  this encounter, including complete performance of at least one of the key components, in conjunction with the APP. I agree with the APP's note, impression and recommendations  61 year old male with history of coronary artery disease and uncontrolled diabetes, who has had significant functional decline over the past year, with progressive weakness, marked unintentional weight loss, sarcopenia and cognitive impairment, who was admitted with several days of painless bright red blood per rectum and an acute drop in hemoglobin from 10 to 7.8.  Per his nurse, he was wearing a diaper when he came to the floor which had a large amount of black and reddish colored stool. The patient is not able to provide a very detailed history.  He is very vague when his description of his symptoms and seems very unsure about most of his answers in terms of the details of his symptoms.  He has been evaluated by neurology and his symptoms were felt to be most likely related to uncontrolled diabetes, deconditioning and vascular dementia.  The etiology of his GI bleeding seems most consistent with either hemorrhoidal bleeding versus diverticular bleeding.  However given his significant weight loss and unreliable history, an upper endoscopy and colonoscopy are both indicated to exclude malignancy.  Plan for EGD and colonoscopy tomorrow (June 11) with Dr.Mansouraty Clear liquid diet today Bowel prep this evening (patient has fecal management system in place) N.p.o. after midnight except for meds and bowel prep  Annita Ratliff E. Candis Schatz, MD [facility]

## 2021-08-20 NOTE — ED Notes (Signed)
Pt's Bp dropped down to 85s. MD made aware. Midodrine given.

## 2021-08-20 NOTE — Progress Notes (Signed)
PROGRESS NOTE  Charles [name] TIW:[id number] DOB: 1960/06/21   PCP: Pearline Cables, MD  Patient is from: Home.  DOA: 08/19/2021 LOS: 1  Chief complaints Chief Complaint  Patient presents with   Rectal Bleeding     Brief Narrative / Interim history: 61 year old M with PMH of IDDM-2, CAD/CABG, BPH/urinary retention with chronic foley, hypotension on midodrine, severe malnutrition, GERD and mild cognitive impairment and HLD presenting with hematochezia for about a week, and admitted for possible lower GI bleed.  Hgb 10.5 (about baseline).  GI consulted.  Next day, Hgb down to 7.8 although no overt bleeding.  He is hypotensive.  Plan for colonoscopy on 6/11.    Subjective: Seen and examined earlier this morning.  No major events overnight of this morning.  No complaints.  He denies chest pain, dyspnea, palpitation, dizziness, nausea, vomiting or abdominal pain.  Has not had rectal bleed while in ED.  Agreeable to blood transfusion if indicated.  Objective: Vitals:   08/20/21 0330 08/20/21 0345 08/20/21 0600 08/20/21 0830  BP: 98/60  (!) 89/60 99/63  Pulse: 81 84 76 72  Resp: 17 (!) 23 15 (!) 21  Temp:      TempSrc:      SpO2: 100% 100% 100% 100%  Weight:      Height:        Examination:  GENERAL: Appears frail.  No apparent distress. HEENT: MMM.  Vision and hearing grossly intact.  NECK: Supple.  No apparent JVD.  RESP:  No IWOB.  Fair aeration bilaterally. CVS:  RRR. Heart sounds normal.  ABD/GI/GU: BS+. Abd soft, NTND.  MSK/EXT:  Moves extremities. No apparent deformity. No edema.  SKIN: no apparent skin lesion or wound NEURO: Awake, alert and oriented appropriately.  No apparent focal neuro deficit. PSYCH: Calm. Normal affect.   Procedures:  None  Microbiology summarized: None  Assessment and plan Principal Problem:   Acute lower GI bleeding Active Problems:   Uncontrolled IDDM-2 with hyperglycemia   Acute blood loss anemia   CAD S/p remote CABG   BPH  (benign prostatic hyperplasia)   HLD (hyperlipidemia)   Hypotension   Protein-calorie malnutrition, severe (HCC)   Hypomagnesemia  Acute blood loss anemia due to acute lower GI bleed: Presents with hematochezia for about a week.  This could be hemorrhoidal bleed or from diverticulosis. The abdomen shows diverticulosis.  Hgb trended down to 7.8 partly hemodilution from IV fluid.  He is hypotensive but not acute. Recent Labs    07/02/21 0246 07/03/21 0152 07/04/21 0117 07/05/21 0124 07/06/21 0118 07/07/21 0147 08/19/21 1820 08/20/21 0050 08/20/21 0237 08/20/21 0855  HGB 11.2* 11.2* 10.6* 11.1* 10.5* 10.3* 10.5* 8.9* 8.1* 7.8*  -Decrease IV fluids to 60 cc an hour.  He only weighs 41 kg. -Monitor H&H.  Transfuse for Hgb <8.0.  Verbally consented for -Plan for colonoscopy per GI -P.o. except sips with meds -Check anemia panel -Continue holding aspirin -SCD for VTE prophylaxis  Uncontrolled IDDM-2 with hyperglycemia: A1c is 10.4% on 07/03/2021.  On Lantus 14 units, sliding scale and metformin at home. Recent Labs  Lab 08/20/21 0112 08/20/21 0422 08/20/21 0738  GLUCAP 285* 159* 167*  -Continue SSI-sensitive while n.p.o. -CBG monitoring -Continue home statin.  History of CAD/remote CABG: Stable.  No cardiopulmonary symptoms. -Continue home medications except aspirin  Hypotension: He takes midodrine 10 mg daily although it was prescribed 3 times daily -Resume midodrine 10 mg 3 times daily.  BPH/urinary retention with chronic Foley.  He seems to be  on Flomax and oxybutynin at the same time. -Continue Foley catheter -Hold Flomax and oxybutynin in the setting of hypotension.  History of GERD -Continue PPI  Hypomagnesemia: Mg 1.2. -IV magnesium sulfate 4 g x 1  Mild cognitive impairment: Seem to be fairly oriented. -Reorientation and delirium precautions  Severe malnutrition: Body mass index is 14.53 kg/m.  Significant muscle mass and subcu fat loss. -Consult dietitian  once he started eating -Continue Remeron  Physical deconditioning -PT/OT eval   DVT prophylaxis:  SCDs Start: 08/19/21 2220  Code Status: Full code Family Communication: Patient declined. Level of care: Progressive Status is: Inpatient Remains inpatient appropriate because: Due to acute blood loss anemia in the setting of GI bleed   Final disposition: TBD Consultants:  Gastroenterology  Sch Meds:  Scheduled Meds:  insulin aspart  0-9 Units Subcutaneous Q4H   midodrine  10 mg Oral TID WC   Continuous Infusions:  sodium chloride 125 mL/hr at 08/20/21 0044   magnesium sulfate bolus IVPB     PRN Meds:.acetaminophen **OR** acetaminophen  Antimicrobials: Anti-infectives (From admission, onward)    None        I have personally reviewed the following labs and images: CBC: Recent Labs  Lab 08/19/21 1820 08/20/21 0050 08/20/21 0237 08/20/21 0855  WBC 10.3  --   --  9.9  NEUTROABS 8.2*  --   --   --   HGB 10.5* 8.9* 8.1* 7.8*  HCT 32.0* 25.7* 24.8* 23.6*  MCV 89.1  --   --  88.1  PLT 340  --   --  293   BMP &GFR Recent Labs  Lab 08/19/21 1820 08/20/21 0600 08/20/21 0646  NA 135  --  137  K 4.1  --  3.6  CL 101  --  105  CO2 23  --  23  GLUCOSE 256*  --  170*  BUN 13  --  10  CREATININE 0.75  --  0.72  CALCIUM 8.7*  --  8.1*  MG  --  1.2*  --   PHOS  --   --  3.0   Estimated Creatinine Clearance: 58.1 mL/min (by C-G formula based on SCr of 0.72 mg/dL). Liver & Pancreas: Recent Labs  Lab 08/19/21 1820 08/20/21 0646  AST 21  --   ALT 13  --   ALKPHOS 102  --   BILITOT 0.4  --   PROT 6.5  --   ALBUMIN 2.5* 2.1*   No results for input(s): "LIPASE", "AMYLASE" in the last 168 hours. No results for input(s): "AMMONIA" in the last 168 hours. Diabetic: No results for input(s): "HGBA1C" in the last 72 hours. Recent Labs  Lab 08/20/21 0112 08/20/21 0422 08/20/21 0738  GLUCAP 285* 159* 167*   Cardiac Enzymes: No results for input(s):  "CKTOTAL", "CKMB", "CKMBINDEX", "TROPONINI" in the last 168 hours. No results for input(s): "PROBNP" in the last 8760 hours. Coagulation Profile: No results for input(s): "INR", "PROTIME" in the last 168 hours. Thyroid Function Tests: No results for input(s): "TSH", "T4TOTAL", "FREET4", "T3FREE", "THYROIDAB" in the last 72 hours. Lipid Profile: No results for input(s): "CHOL", "HDL", "LDLCALC", "TRIG", "CHOLHDL", "LDLDIRECT" in the last 72 hours. Anemia Panel: Recent Labs    08/20/21 0600  VITAMINB12 350  FERRITIN 290  TIBC 209*  IRON 26*   Urine analysis:    Component Value Date/Time   COLORURINE YELLOW 06/30/2021 2023   APPEARANCEUR CLOUDY (A) 06/30/2021 2023   APPEARANCEUR Hazy (A) 05/09/2021 1006   LABSPEC 1.010  06/30/2021 2023   PHURINE 5.0 06/30/2021 2023   GLUCOSEU >=500 (A) 06/30/2021 2023   HGBUR MODERATE (A) 06/30/2021 2023   BILIRUBINUR NEGATIVE 06/30/2021 2023   BILIRUBINUR Negative 05/09/2021 1006   KETONESUR 80 (A) 06/30/2021 2023   PROTEINUR NEGATIVE 06/30/2021 2023   NITRITE POSITIVE (A) 06/30/2021 2023   LEUKOCYTESUR LARGE (A) 06/30/2021 2023   Sepsis Labs: Invalid input(s): "PROCALCITONIN", "LACTICIDVEN"  Microbiology: No results found for this or any previous visit (from the past 240 hour(s)).  Radiology Studies: CT Abdomen Pelvis Wo Contrast  Result Date: 08/19/2021 CLINICAL DATA:  Abdominal pain EXAM: CT ABDOMEN AND PELVIS WITHOUT CONTRAST TECHNIQUE: Multidetector CT imaging of the abdomen and pelvis was performed following the standard protocol without IV contrast. RADIATION DOSE REDUCTION: This exam was performed according to the departmental dose-optimization program which includes automated exposure control, adjustment of the mA and/or kV according to patient size and/or use of iterative reconstruction technique. COMPARISON:  07/03/2021 FINDINGS: Lower Chest: Normal. Hepatobiliary: Normal hepatic contours. No intra- or extrahepatic biliary  dilatation. The gallbladder is normal. Pancreas: Normal pancreas. No ductal dilatation or peripancreatic fluid collection. Spleen: Normal. Adrenals/Urinary Tract: The adrenal glands are normal. No hydronephrosis, nephroureterolithiasis or solid renal mass. Decompressed by Foley catheter. Stomach/Bowel: There is no hiatal hernia. Normal duodenal course and caliber. No small bowel dilatation or inflammation. No focal colonic abnormality. Normal appendix. Vascular/Lymphatic: There is calcific atherosclerosis of the abdominal aorta. No lymphadenopathy. Reproductive: Normal prostate size with symmetric seminal vesicles. Other: None. Musculoskeletal: No bony spinal canal stenosis or focal osseous abnormality. IMPRESSION: 1. No acute abnormality of the abdomen or pelvis. 2. Aortic Atherosclerosis (ICD10-I70.0). Electronically Signed   By: Deatra Robinson M.D.   On: 08/19/2021 19:23      Jacoya Bauman T. Author [name]  If 7PM-7AM, please contact night-coverage [email or web address] 08/20/2021, 10:19 AM

## 2021-08-20 NOTE — Anesthesia Preprocedure Evaluation (Addendum)
Anesthesia Evaluation  Patient identified by MRN, date of birth, ID band Patient awake    Reviewed: Allergy & Precautions, NPO status , Patient's Chart, lab work & pertinent test results  History of Anesthesia Complications Negative for: history of anesthetic complications  Airway Mallampati: II  TM Distance: >3 FB Neck ROM: Full    Dental  (+) Teeth Intact, Dental Advisory Given   Pulmonary neg pulmonary ROS,    breath sounds clear to auscultation       Cardiovascular hypertension (now on mitodrine BP support), (-) angina+ CAD and + CABG   Rhythm:Regular Rate:Normal  03/2021 ECHO: EF 60-65%, normal LVF, Grade 1 DD, no significant valvular abnormalities   Neuro/Psych negative neurological ROS     GI/Hepatic Neg liver ROS, GERD  Medicated and Controlled,GI bleed   Endo/Other  diabetes (glu 79), Insulin Dependent, Oral Hypoglycemic Agents  Renal/GU negative Renal ROS     Musculoskeletal   Abdominal   Peds  Hematology  (+) Blood dyscrasia (Hb 7.7), anemia ,   Anesthesia Other Findings   Reproductive/Obstetrics                            Anesthesia Physical Anesthesia Plan  ASA: 3  Anesthesia Plan: MAC   Post-op Pain Management: Minimal or no pain anticipated   Induction:   PONV Risk Score and Plan: 1 and Ondansetron and Treatment may vary due to age or medical condition  Airway Management Planned: Natural Airway and Nasal Cannula  Additional Equipment: None  Intra-op Plan:   Post-operative Plan:   Informed Consent: I have reviewed the patients History and Physical, chart, labs and discussed the procedure including the risks, benefits and alternatives for the proposed anesthesia with the patient or authorized representative who has indicated his/her understanding and acceptance.     Dental advisory given  Plan Discussed with: CRNA and Surgeon  Anesthesia Plan Comments:         Anesthesia Quick Evaluation

## 2021-08-20 NOTE — Hospital Course (Addendum)
61 year old M with PMH of IDDM-2, CAD/CABG, BPH/urinary retention with chronic foley, hypotension on midodrine, severe malnutrition, hemorrhoids, GERD and mild cognitive impairment and HLD presenting with hematochezia for about a week, and admitted for possible lower GI bleed.  Hgb 10.5 (about baseline).  GI consulted.  Next day, Hgb down to 7.8 although no overt bleeding.  He is hypotensive.  Plan for colonoscopy on 6/11.

## 2021-08-20 NOTE — Evaluation (Signed)
Physical Therapy Evaluation Patient Details Name: Charles [name] MRN: [id number] DOB: 07-01-1960 Today's Date: 08/20/2021  History of Present Illness  61 y.o. male presented to ED 08/19/21 complaining of rectal bleeding x1 week. Hypotensive, plan for colonoscopy 6/11. 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 PTA patient was apparently living alone with assistance of friends/neighbors for community needs. (Had recently been in SNF for skilled therapies). Pt currently requires min assist for bed mobility and unable to progress to standing due to symptomatic orthostatic hypotension. BP supine 92/60 HR 81, sitting 64/49 HR 90, after sitting 3 minutes he was feeling more dizzy and attempted BP with no reading given. Patient returned to sidelying and BP returned to 108/70 HR81.  Currently recommending SNF, however question if pt recently used all his skilled days as he went to SNF mid-April 2023. Anticipate patient will benefit from PT to address problems listed below.Will continue to follow acutely to maximize functional mobility independence and safety.          Recommendations for follow up therapy are one component of a multi-disciplinary discharge planning process, led by the attending physician.  Recommendations may be updated based on patient status, additional functional criteria and insurance authorization.  Follow Up Recommendations Skilled nursing-short term rehab (<3 hours/day)    Assistance Recommended at Discharge Frequent or constant Supervision/Assistance  Patient can return home with the following  Two people to help with walking and/or transfers;Two people to help with  bathing/dressing/bathroom;Assistance with cooking/housework;Direct supervision/assist for medications management;Direct supervision/assist for financial management;Assist for transportation;Help with stairs or ramp for entrance    Equipment Recommendations None recommended by PT  Recommendations for Other Services  OT consult    Functional Status Assessment Patient has had a recent decline in their functional status and/or demonstrates limited ability to make significant improvements in function in a reasonable and predictable amount of time     Precautions / Restrictions Precautions Precautions: Fall Restrictions Weight Bearing Restrictions: No      Mobility  Bed Mobility Overal bed mobility: Needs Assistance Bed Mobility: Rolling, Sidelying to Sit, Sit to Sidelying Rolling: Supervision Sidelying to sit: Min assist     Sit to sidelying: Min guard General bed mobility comments: patient needed physical assist, and multimodal cues with increased time to participate in tasks.    Transfers                   General transfer comment: unable due to drop in BP with pt dizzy and asking to return to supine    Ambulation/Gait               General Gait Details: unable  Stairs            Wheelchair Mobility    Modified Rankin (Stroke Patients Only)       Balance Overall balance assessment: Needs assistance Sitting-balance support: Feet supported, Bilateral upper extremity supported Sitting balance-Leahy Scale: Poor Sitting balance - Comments: requires UE support for sitting       Standing balance comment: unable to stand                             Pertinent Vitals/Pain Pain Assessment Pain Assessment: Faces Faces Pain Scale: Hurts a little  bit Pain Location: sacrum; wound Pain Descriptors / Indicators: Constant Pain Intervention(s): Limited activity within patient's tolerance, Monitored during session, Repositioned    Home Living  Family/patient expects to be discharged to:: Private residence Living Arrangements: Alone Available Help at Discharge: Neighbor;Available PRN/intermittently Type of Home: Apartment Home Access: Stairs to enter   Entrance Stairs-Number of Steps: 12   Home Layout: One level Home Equipment: Rollator (4 wheels);Rolling Walker (2 wheels);Grab bars - toilet;Grab bars - tub/shower Additional Comments: previous record in April 2023 stated 4 steps to enter; questioned pt and he insisted it is 12 steps to enter    Prior Function Prior Level of Function : Other (comment);Independent/Modified Independent             Mobility Comments: pt reports he walks with rollator; reports going to grocery store 1 week ago (neighbor drove him) and walked pushing cart; per medical record went to SNF when discharged April 2023, was picked up by EMS from his home       Hand Dominance   Dominant Hand: Right    Extremity/Trunk Assessment   Upper Extremity Assessment Upper Extremity Assessment: Defer to OT evaluation    Lower Extremity Assessment Lower Extremity Assessment: Generalized weakness (very cachectic)    Cervical / Trunk Assessment Cervical / Trunk Assessment: Kyphotic  Communication   Communication: No difficulties  Cognition Arousal/Alertness: Awake/alert Behavior During Therapy: Flat affect Overall Cognitive Status: Difficult to assess Area of Impairment: Attention, Following commands, Safety/judgement, Awareness, Problem solving                   Current Attention Level: Sustained   Following Commands: Follows one step commands inconsistently, Follows one step commands with increased time Safety/Judgement: Decreased awareness of safety, Decreased awareness of deficits Awareness: Intellectual Problem Solving: Slow processing, Decreased initiation, Difficulty sequencing, Requires verbal cues, Requires tactile cues General Comments: Patient appeared to forget what task he had  been asked to do, but when asked could recall. He just processes and moves very slowly.        General Comments General comments (skin integrity, edema, etc.): BP supine 92/60 HR 81, sitting 64/49 HR 90 with increaseing dizziness; attempted repeat BP in sitting and did not register, pt requesting to return to bed; once in sidelying 108/70 HR 81    Exercises     Assessment/Plan    PT Assessment Patient needs continued PT services  PT Problem List Decreased strength;Decreased balance;Decreased mobility;Decreased cognition;Decreased knowledge of use of DME;Decreased safety awareness;Decreased knowledge of precautions;Decreased activity tolerance;Cardiopulmonary status limiting activity       PT Treatment Interventions DME instruction;Gait training;Stair training;Functional mobility training;Therapeutic activities;Therapeutic exercise;Balance training;Cognitive remediation;Patient/family education    PT Goals (Current goals can be found in the Care Plan section)  Acute Rehab PT Goals Patient Stated Goal: to return home PT Goal Formulation: With patient Time For Goal Achievement: 09/03/21 Potential to Achieve Goals: Fair    Frequency Min 3X/week (may refuse SNF; ?has any days left)     Co-evaluation               AM-PAC PT "6 Clicks" Mobility  Outcome Measure Help needed turning from your back to your side while in a flat bed without using bedrails?: None Help needed moving from lying on your back to sitting on the side of a flat bed without using bedrails?: A Little Help needed moving to and from a bed to a chair (including a wheelchair)?: Total Help needed standing up from a chair  using your arms (e.g., wheelchair or bedside chair)?: Total Help needed to walk in hospital room?: Total Help needed climbing 3-5 steps with a railing? : Total 6 Click Score: 11    End of Session   Activity Tolerance: Treatment limited secondary to medical complications (Comment)  (hypotension) Patient left: with call bell/phone within reach;in bed;with bed alarm set Nurse Communication: Mobility status;Other (comment) (drop in BP and return to bed) PT Visit Diagnosis: Muscle weakness (generalized) (M62.81);Difficulty in walking, not elsewhere classified (R26.2);Adult, failure to thrive (R62.7)    Time: [id number] PT Time Calculation (min) (ACUTE ONLY): 24 min   Charges:   PT Evaluation $PT Eval Moderate Complexity: 1 Mod           Jerolyn CenterLynn S, PT Acute Rehabilitation Services  Office [phone]   Zena AmosLynn P Teghan Philbin 08/20/2021, 2:49 PM

## 2021-08-20 NOTE — Consult Note (Addendum)
Referring Provider: EDP Primary Care Physician:  Darreld Mclean, MD Primary Gastroenterologist:  Dr. Candis Schatz  Reason for Consultation:  GI bleed  HPI: Charles [name] is a 61 y.o. male with a history of coronary artery disease status post CABG and poorly controlled diabetes with Hgb A1C 10 most recently, ? Cognitive decline and some early dementia.  He presented to [facility] ED with complaints of rectal bleeding.  He is somewhat confused at times but says that this started on Thursday and was "quite a lot of blood".  No further bleeding since yesterday, 6/9.  Hemoglobin 7.8 g down from about 10.5 g earlier this year.  CT scan of the abdomen pelvis without contrast was unremarkable.  Of note, he was seen in our office back in November 2022 with several GI complaints.  He reported having 2 colonoscopies in the past, remotely (likely had EGDs as well but we did not have any records).  He was also having weight loss.  At that time his hemoglobin A1c was greater than 15.5 and he was going to be undergoing some cardiac evaluation.  Plan was to consider EGD and colonoscopy after his cardiac work-up.  He never followed up after that.  He tells me that his nephew was taking him to his appts and he went back to [place] so now he does not have any transportation.  He tells me that he has lost probably around 100 pounds over the past year.  Denies nausea, vomiting, abdominal pain.  Past Medical History:  Diagnosis Date   Arthritis    Diabetes mellitus without complication ([place])    Heart disease    Hypertension     Past Surgical History:  Procedure Laterality Date   CORONARY ARTERY BYPASS GRAFT      Prior to Admission medications   Medication Sig Start Date End Date Taking? Authorizing Provider  aspirin EC 81 MG tablet Take 81 mg by mouth daily. Swallow whole.   Yes [provider]  clotrimazole-betamethasone (LOTRISONE) cream Apply topically 2 (two) times daily. Apply to  foreskin Patient taking differently: Apply 1 application  topically 2 (two) times daily. 04/25/21  Yes Rai, Ripudeep K, MD  docusate sodium (COLACE) 100 MG capsule Take 100 mg by mouth at bedtime.   Yes [provider]  ezetimibe (ZETIA) 10 MG tablet Take 1 tablet (10 mg total) by mouth daily. 07/07/21  Yes Thurnell Lose, MD  gabapentin (NEURONTIN) 300 MG capsule Take 1 capsule (300 mg total) by mouth at bedtime. 04/13/21  Yes Penumalli, Earlean Polka, MD  HUMALOG KWIKPEN 100 UNIT/ML KwikPen Before each meal 3 times a day, 140-199 - 2 units, 200-250 - 4 units, 251-299 - 6 units,  300-349 - 8 units,  350 or above 10 units. Patient taking differently: Inject 2-10 Units into the skin See admin instructions. Before each meal 3 times a day, 140-199 - 2 units, 200-250 - 4 units, 251-299 - 6 units,  300-349 - 8 units,  350 or above 10 units. Call MD <70 or >350 07/07/21  Yes Thurnell Lose, MD  Insulin Glargine (BASAGLAR KWIKPEN) 100 UNIT/ML Inject 14 Units into the skin daily. Patient taking differently: Inject 12 Units into the skin at bedtime. 07/07/21  Yes Thurnell Lose, MD  Lido-PE-Glycerin-Petrolatum (PREPARATION H RAPID RELIEF) 5-0.25-14.4-15 % CREA Place 1 Application rectally every 6 (six) hours as needed (hemorrhoids).   Yes [provider]  metFORMIN (GLUCOPHAGE) 1000 MG tablet TAKE 1 TABLET (1,000 MG TOTAL) BY  MOUTH 2 (TWO) TIMES DAILY WITH A MEAL. 05/15/21  Yes de [place], Raymond J, MD  midodrine (PROAMATINE) 10 MG tablet Take 1 tablet (10 mg total) by mouth 3 (three) times daily with meals. Patient taking differently: Take 10 mg by mouth daily. 07/07/21  Yes Thurnell Lose, MD  mirtazapine (REMERON) 7.5 MG tablet Take 7.5 mg by mouth at bedtime.   Yes [provider]  oxybutynin (DITROPAN) 5 MG tablet Take 1 tablet (5 mg total) by mouth every 8 (eight) hours as needed for bladder spasms. Patient taking differently: Take 5 mg by mouth daily. 04/25/21  Yes Rai, Ripudeep K,  MD  pantoprazole (PROTONIX) 40 MG tablet Take 1 tablet (40 mg total) by mouth daily. Any generic PPI is okay to dispense Patient taking differently: Take 40 mg by mouth daily. 04/25/21  Yes Rai, Ripudeep K, MD  polyethylene glycol (MIRALAX / GLYCOLAX) 17 g packet Take 17 g by mouth daily.   Yes [provider]  QUEtiapine (SEROQUEL) 25 MG tablet Take 1 tablet (25 mg total) by mouth 2 (two) times daily. 07/07/21  Yes Thurnell Lose, MD  rosuvastatin (CRESTOR) 40 MG tablet Take 1 tablet (40 mg total) by mouth daily. 04/25/21  Yes Rai, Ripudeep K, MD  tamsulosin (FLOMAX) 0.4 MG CAPS capsule Take 1 capsule (0.4 mg total) by mouth daily. 07/08/21  Yes Thurnell Lose, MD  blood glucose meter kit and supplies Dispense based on patient and insurance preference. Use up to four times daily as directed. (FOR ICD-10 E10.9, E11.9). 12/23/20   de [place], Raymond J, MD  Continuous Blood Gluc Sensor (FREESTYLE LIBRE 2 SENSOR) MISC 3 Units by Does not apply route 3 (three) times daily with meals. 04/25/21   Mendel Corning, MD    Current Facility-Administered Medications  Medication Dose Route Frequency Provider Last Rate Last Admin   acetaminophen (TYLENOL) tablet 650 mg  650 mg Oral Q6H PRN Shela Leff, MD       Or   acetaminophen (TYLENOL) suppository 650 mg  650 mg Rectal Q6H PRN Shela Leff, MD       insulin aspart (novoLOG) injection 0-9 Units  0-9 Units Subcutaneous Q4H Shela Leff, MD   2 Units at 08/20/21 0750   magnesium sulfate IVPB 4 g 100 mL  4 g Intravenous Once Wendee Beavers T, MD       midodrine (PROAMATINE) tablet 10 mg  10 mg Oral TID WC Wendee Beavers T, MD   10 mg at 08/20/21 0754   Current Outpatient Medications  Medication Sig Dispense Refill   aspirin EC 81 MG tablet Take 81 mg by mouth daily. Swallow whole.     clotrimazole-betamethasone (LOTRISONE) cream Apply topically 2 (two) times daily. Apply to foreskin (Patient taking differently: Apply 1 application   topically 2 (two) times daily.) 45 g 4   docusate sodium (COLACE) 100 MG capsule Take 100 mg by mouth at bedtime.     ezetimibe (ZETIA) 10 MG tablet Take 1 tablet (10 mg total) by mouth daily.     gabapentin (NEURONTIN) 300 MG capsule Take 1 capsule (300 mg total) by mouth at bedtime. 30 capsule 3   HUMALOG KWIKPEN 100 UNIT/ML KwikPen Before each meal 3 times a day, 140-199 - 2 units, 200-250 - 4 units, 251-299 - 6 units,  300-349 - 8 units,  350 or above 10 units. (Patient taking differently: Inject 2-10 Units into the skin See admin instructions. Before each meal 3 times a day,  140-199 - 2 units, 200-250 - 4 units, 251-299 - 6 units,  300-349 - 8 units,  350 or above 10 units. Call MD <70 or >350) 15 mL 0   Insulin Glargine (BASAGLAR KWIKPEN) 100 UNIT/ML Inject 14 Units into the skin daily. (Patient taking differently: Inject 12 Units into the skin at bedtime.) 15 mL 0   Lido-PE-Glycerin-Petrolatum (PREPARATION H RAPID RELIEF) 5-0.25-14.4-15 % CREA Place 1 Application rectally every 6 (six) hours as needed (hemorrhoids).     metFORMIN (GLUCOPHAGE) 1000 MG tablet TAKE 1 TABLET (1,000 MG TOTAL) BY MOUTH 2 (TWO) TIMES DAILY WITH A MEAL. 60 tablet 0   midodrine (PROAMATINE) 10 MG tablet Take 1 tablet (10 mg total) by mouth 3 (three) times daily with meals. (Patient taking differently: Take 10 mg by mouth daily.)     mirtazapine (REMERON) 7.5 MG tablet Take 7.5 mg by mouth at bedtime.     oxybutynin (DITROPAN) 5 MG tablet Take 1 tablet (5 mg total) by mouth every 8 (eight) hours as needed for bladder spasms. (Patient taking differently: Take 5 mg by mouth daily.) 90 tablet 1   pantoprazole (PROTONIX) 40 MG tablet Take 1 tablet (40 mg total) by mouth daily. Any generic PPI is okay to dispense (Patient taking differently: Take 40 mg by mouth daily.) 30 tablet 1   polyethylene glycol (MIRALAX / GLYCOLAX) 17 g packet Take 17 g by mouth daily.     QUEtiapine (SEROQUEL) 25 MG tablet Take 1 tablet (25 mg total)  by mouth 2 (two) times daily.     rosuvastatin (CRESTOR) 40 MG tablet Take 1 tablet (40 mg total) by mouth daily. 30 tablet 3   tamsulosin (FLOMAX) 0.4 MG CAPS capsule Take 1 capsule (0.4 mg total) by mouth daily. 30 capsule    blood glucose meter kit and supplies Dispense based on patient and insurance preference. Use up to four times daily as directed. (FOR ICD-10 E10.9, E11.9). 1 each 11   Continuous Blood Gluc Sensor (FREESTYLE LIBRE 2 SENSOR) MISC 3 Units by Does not apply route 3 (three) times daily with meals. 1 each 3    Allergies as of 08/19/2021   (No Known Allergies)    Family History  Problem Relation Age of Onset   Diabetes Mother    Hypertension Mother    Diabetes Father    Hypertension Father    Heart attack Father    Diabetes Sister    Diabetes Sister     Social History   Socioeconomic History   Marital status: Single    Spouse name: Not on file   Number of children: 0   Years of education: masters   Highest education level: Not on file  Occupational History   Occupation: semi retired/technology  Tobacco Use   Smoking status: Never   Smokeless tobacco: Never  Vaping Use   Vaping Use: Never used  Substance and Sexual Activity   Alcohol use: Not Currently    Comment: none in 6 months may open a bottle of wine tonight 11/1112022   Drug use: Never   Sexual activity: Not on file  Other Topics Concern   Not on file  Social History Narrative   Right handed-    Caffeine - 3 cups per day   Resides at [facility] on [address]    Social Determinants of Health   Financial Resource Strain: Not on file  Food Insecurity: Not on file  Transportation Needs: Not on file  Physical Activity: Not  on file  Stress: Not on file  Social Connections: Not on file  Intimate Partner Violence: Not on file    Review of Systems: ROS is O/W negative except as mentioned in HPI.  Physical Exam: Vital signs in last 24 hours: Temp:  [97.8 F (36.6 C)] 97.8 F  (36.6 C) (06/09 1806) Pulse Rate:  [72-92] 72 (06/10 0830) Resp:  [12-29] 21 (06/10 0830) BP: (86-120)/(49-78) 99/63 (06/10 0830) SpO2:  [94 %-100 %] 100 % (06/10 0830) Weight:  [40.8 kg] 40.8 kg (06/09 1806)   General:  Alert, thin, pleasant and cooperative in NAD Head:  Normocephalic and atraumatic. Eyes:  Sclera clear, no icterus.  Conjunctiva pink. Ears:  Normal auditory acuity. Mouth:  No deformity or lesions.  Lungs:  Clear throughout to auscultation.  No wheezes, crackles, or rhonchi.  Heart:  Regular rate and rhythm; no murmurs, clicks, rubs, or gallops. Abdomen:  Soft, non-distended.  BS present.  Non-tender.   Msk:  Symmetrical without gross deformities. Pulses:  Normal pulses noted. Extremities:  Without clubbing or edema. Neurologic:  Alert and oriented x 4;  grossly normal neurologically.  Seems confused at times. Skin:  Intact without significant lesions or rashes. Psych:  Alert and cooperative. Normal mood and affect.  Intake/Output from previous day: 06/09 0701 - 06/10 0700 In: 1000 [I.V.:1000] Out: -   Lab Results: Recent Labs    08/19/21 1820 08/20/21 0050 08/20/21 0237 08/20/21 0855  WBC 10.3  --   --  9.9  HGB 10.5* 8.9* 8.1* 7.8*  HCT 32.0* 25.7* 24.8* 23.6*  PLT 340  --   --  293   BMET Recent Labs    08/19/21 1820 08/20/21 0646  NA 135 137  K 4.1 3.6  CL 101 105  CO2 23 23  GLUCOSE 256* 170*  BUN 13 10  CREATININE 0.75 0.72  CALCIUM 8.7* 8.1*   LFT Recent Labs    08/19/21 1820 08/20/21 0646  PROT 6.5  --   ALBUMIN 2.5* 2.1*  AST 21  --   ALT 13  --   ALKPHOS 102  --   BILITOT 0.4  --    Studies/Results: CT Abdomen Pelvis Wo Contrast  Result Date: 08/19/2021 CLINICAL DATA:  Abdominal pain EXAM: CT ABDOMEN AND PELVIS WITHOUT CONTRAST TECHNIQUE: Multidetector CT imaging of the abdomen and pelvis was performed following the standard protocol without IV contrast. RADIATION DOSE REDUCTION: This exam was performed according to the  departmental dose-optimization program which includes automated exposure control, adjustment of the mA and/or kV according to patient size and/or use of iterative reconstruction technique. COMPARISON:  07/03/2021 FINDINGS: Lower Chest: Normal. Hepatobiliary: Normal hepatic contours. No intra- or extrahepatic biliary dilatation. The gallbladder is normal. Pancreas: Normal pancreas. No ductal dilatation or peripancreatic fluid collection. Spleen: Normal. Adrenals/Urinary Tract: The adrenal glands are normal. No hydronephrosis, nephroureterolithiasis or solid renal mass. Decompressed by Foley catheter. Stomach/Bowel: There is no hiatal hernia. Normal duodenal course and caliber. No small bowel dilatation or inflammation. No focal colonic abnormality. Normal appendix. Vascular/Lymphatic: There is calcific atherosclerosis of the abdominal aorta. No lymphadenopathy. Reproductive: Normal prostate size with symmetric seminal vesicles. Other: None. Musculoskeletal: No bony spinal canal stenosis or focal osseous abnormality. IMPRESSION: 1. No acute abnormality of the abdomen or pelvis. 2. Aortic Atherosclerosis (ICD10-I70.0). Electronically Signed   By: Kevin  Herman M.D.   On: 08/19/2021 19:23    IMPRESSION:  *Rectal bleeding: Sounds lower in origin such as diverticular, etc, but will rule out upper as   well. *Acute blood loss anemia: Hemoglobin about 3 g down from earlier this year.  7.8 grams here.  No further bleeding since yesterday 6/9. *Weight loss:  He estimates 100 pound weight loss over the past year or so.  Non-contrast CT scan of the abdomen and pelvis was unremarkable.  PLAN: -We will plan for EGD and colonoscopy on 6/11. -Monitor hemoglobin and transfuse if needed. -If re-bleeds briskly may need a CTA to look at vasculature and rule out bleeding source.  Laban Emperor. Zehr  08/20/2021, 11:22 AM  I have taken a history, reviewed the chart and examined the patient. I performed a substantive portion of  this encounter, including complete performance of at least one of the key components, in conjunction with the APP. I agree with the APP's note, impression and recommendations  61 year old male with history of coronary artery disease and uncontrolled diabetes, who has had significant functional decline over the past year, with progressive weakness, marked unintentional weight loss, sarcopenia and cognitive impairment, who was admitted with several days of painless bright red blood per rectum and an acute drop in hemoglobin from 10 to 7.8.  Per his nurse, he was wearing a diaper when he came to the floor which had a large amount of black and reddish colored stool. The patient is not able to provide a very detailed history.  He is very vague when his description of his symptoms and seems very unsure about most of his answers in terms of the details of his symptoms.  He has been evaluated by neurology and his symptoms were felt to be most likely related to uncontrolled diabetes, deconditioning and vascular dementia.  The etiology of his GI bleeding seems most consistent with either hemorrhoidal bleeding versus diverticular bleeding.  However given his significant weight loss and unreliable history, an upper endoscopy and colonoscopy are both indicated to exclude malignancy.  Plan for EGD and colonoscopy tomorrow (June 11) with Dr.Mansouraty Clear liquid diet today Bowel prep this evening (patient has fecal management system in place) N.p.o. after midnight except for meds and bowel prep  Carlton Buskey E. Candis Schatz, MD [facility]

## 2021-08-21 ENCOUNTER — Inpatient Hospital Stay (HOSPITAL_COMMUNITY): Payer: Commercial Managed Care - HMO | Admitting: Anesthesiology

## 2021-08-21 ENCOUNTER — Encounter (HOSPITAL_COMMUNITY)
Admission: EM | Disposition: A | Discharge status: Skilled Nursing Facility | Payer: Self-pay | Source: Home / Self Care | Attending: Student

## 2021-08-21 DIAGNOSIS — K626 Ulcer of anus and rectum: Secondary | ICD-10-CM | POA: Diagnosis not present

## 2021-08-21 DIAGNOSIS — D500 Iron deficiency anemia secondary to blood loss (chronic): Secondary | ICD-10-CM | POA: Diagnosis not present

## 2021-08-21 DIAGNOSIS — D509 Iron deficiency anemia, unspecified: Secondary | ICD-10-CM | POA: Diagnosis not present

## 2021-08-21 DIAGNOSIS — K625 Hemorrhage of anus and rectum: Secondary | ICD-10-CM | POA: Diagnosis not present

## 2021-08-21 DIAGNOSIS — K648 Other hemorrhoids: Secondary | ICD-10-CM

## 2021-08-21 DIAGNOSIS — K649 Unspecified hemorrhoids: Secondary | ICD-10-CM

## 2021-08-21 DIAGNOSIS — E876 Hypokalemia: Secondary | ICD-10-CM

## 2021-08-21 DIAGNOSIS — E119 Type 2 diabetes mellitus without complications: Secondary | ICD-10-CM | POA: Diagnosis not present

## 2021-08-21 DIAGNOSIS — K922 Gastrointestinal hemorrhage, unspecified: Secondary | ICD-10-CM | POA: Diagnosis not present

## 2021-08-21 DIAGNOSIS — K5730 Diverticulosis of large intestine without perforation or abscess without bleeding: Secondary | ICD-10-CM

## 2021-08-21 DIAGNOSIS — K635 Polyp of colon: Secondary | ICD-10-CM | POA: Diagnosis not present

## 2021-08-21 DIAGNOSIS — N401 Enlarged prostate with lower urinary tract symptoms: Secondary | ICD-10-CM | POA: Diagnosis not present

## 2021-08-21 DIAGNOSIS — R634 Abnormal weight loss: Secondary | ICD-10-CM

## 2021-08-21 DIAGNOSIS — D62 Acute posthemorrhagic anemia: Secondary | ICD-10-CM | POA: Diagnosis not present

## 2021-08-21 HISTORY — PX: ESOPHAGOGASTRODUODENOSCOPY (EGD) WITH PROPOFOL: SHX5813

## 2021-08-21 HISTORY — PX: COLONOSCOPY WITH PROPOFOL: SHX5780

## 2021-08-21 HISTORY — PX: POLYPECTOMY: SHX5525

## 2021-08-21 LAB — CBC
HCT: 25.9 % — ABNORMAL LOW (ref 39.0–52.0)
Hemoglobin: 8.7 g/dL — ABNORMAL LOW (ref 13.0–17.0)
MCH: 28.2 pg (ref 26.0–34.0)
MCHC: 33.6 g/dL (ref 30.0–36.0)
MCV: 84.1 fL (ref 80.0–100.0)
Platelets: 260 10*3/uL (ref 150–400)
RBC: 3.08 MIL/uL — ABNORMAL LOW (ref 4.22–5.81)
RDW: 15.8 % — ABNORMAL HIGH (ref 11.5–15.5)
WBC: 7.1 10*3/uL (ref 4.0–10.5)
nRBC: 0 % (ref 0.0–0.2)

## 2021-08-21 LAB — TYPE AND SCREEN
ABO/RH(D): A POS
Antibody Screen: NEGATIVE
Unit division: 0

## 2021-08-21 LAB — RENAL FUNCTION PANEL
Albumin: 1.9 g/dL — ABNORMAL LOW (ref 3.5–5.0)
Anion gap: 10 (ref 5–15)
BUN: 10 mg/dL (ref 6–20)
CO2: 20 mmol/L — ABNORMAL LOW (ref 22–32)
Calcium: 7.8 mg/dL — ABNORMAL LOW (ref 8.9–10.3)
Chloride: 108 mmol/L (ref 98–111)
Creatinine, Ser: 0.74 mg/dL (ref 0.61–1.24)
GFR, Estimated: >60 mL/min (ref >60)
Glucose, Bld: 144 mg/dL — ABNORMAL HIGH (ref 70–99)
Phosphorus: 2.9 mg/dL (ref 2.5–4.6)
Potassium: 3.2 mmol/L — ABNORMAL LOW (ref 3.5–5.1)
Sodium: 138 mmol/L (ref 135–145)

## 2021-08-21 LAB — GLUCOSE, CAPILLARY
Glucose-Capillary: 117 mg/dL — ABNORMAL HIGH (ref 70–99)
Glucose-Capillary: 123 mg/dL — ABNORMAL HIGH (ref 70–99)
Glucose-Capillary: 155 mg/dL — ABNORMAL HIGH (ref 70–99)
Glucose-Capillary: 210 mg/dL — ABNORMAL HIGH (ref 70–99)
Glucose-Capillary: 232 mg/dL — ABNORMAL HIGH (ref 70–99)
Glucose-Capillary: 250 mg/dL — ABNORMAL HIGH (ref 70–99)
Glucose-Capillary: 79 mg/dL (ref 70–99)

## 2021-08-21 LAB — BPAM RBC
Blood Product Expiration Date: 202306152359
ISSUE DATE / TIME: 202306102008
Unit Type and Rh: 6200

## 2021-08-21 LAB — HEMOGLOBIN AND HEMATOCRIT, BLOOD
HCT: 24 % — ABNORMAL LOW (ref 39.0–52.0)
Hemoglobin: 8.1 g/dL — ABNORMAL LOW (ref 13.0–17.0)

## 2021-08-21 LAB — MAGNESIUM: Magnesium: 1.8 mg/dL (ref 1.7–2.4)

## 2021-08-21 SURGERY — ESOPHAGOGASTRODUODENOSCOPY (EGD) WITH PROPOFOL
Anesthesia: Monitor Anesthesia Care

## 2021-08-21 SURGERY — COLONOSCOPY
Anesthesia: Monitor Anesthesia Care

## 2021-08-21 MED ORDER — POTASSIUM CHLORIDE CRYS ER 20 MEQ PO TBCR
40.0000 meq | EXTENDED_RELEASE_TABLET | Freq: Once | ORAL | Status: AC
  Administered 2021-08-21: 40 meq via ORAL
  Filled 2021-08-21: qty 2

## 2021-08-21 MED ORDER — PHENYLEPHRINE 80 MCG/ML (10ML) SYRINGE FOR IV PUSH (FOR BLOOD PRESSURE SUPPORT)
PREFILLED_SYRINGE | INTRAVENOUS | Status: DC | PRN
  Administered 2021-08-21: 160 ug via INTRAVENOUS

## 2021-08-21 MED ORDER — PROPOFOL 500 MG/50ML IV EMUL
INTRAVENOUS | Status: DC | PRN
  Administered 2021-08-21: 125 ug/kg/min via INTRAVENOUS

## 2021-08-21 MED ORDER — TAMSULOSIN HCL 0.4 MG PO CAPS
0.4000 mg | ORAL_CAPSULE | Freq: Every day | ORAL | Status: DC
  Administered 2021-08-21: 0.4 mg via ORAL
  Filled 2021-08-21: qty 1

## 2021-08-21 MED ORDER — LACTATED RINGERS IV SOLN: INTRAVENOUS | Status: DC

## 2021-08-21 MED ORDER — PROPOFOL 10 MG/ML IV BOLUS
INTRAVENOUS | Status: DC | PRN
  Administered 2021-08-21: 20 mg via INTRAVENOUS

## 2021-08-21 MED ORDER — EPHEDRINE SULFATE-NACL 50-0.9 MG/10ML-% IV SOSY
PREFILLED_SYRINGE | INTRAVENOUS | Status: DC | PRN
  Administered 2021-08-21 (×2): 10 mg via INTRAVENOUS

## 2021-08-21 MED ORDER — PANTOPRAZOLE SODIUM 40 MG PO TBEC
40.0000 mg | DELAYED_RELEASE_TABLET | Freq: Every day | ORAL | Status: DC
  Administered 2021-08-22 – 2021-08-31 (×10): 40 mg via ORAL
  Filled 2021-08-21 (×10): qty 1

## 2021-08-21 MED ORDER — POTASSIUM CHLORIDE 10 MEQ/100ML IV SOLN
10.0000 meq | INTRAVENOUS | Status: AC
  Administered 2021-08-21 (×4): 10 meq via INTRAVENOUS
  Filled 2021-08-21 (×4): qty 100

## 2021-08-21 MED ORDER — PHENYLEPHRINE HCL-NACL 20-0.9 MG/250ML-% IV SOLN
INTRAVENOUS | Status: DC | PRN
  Administered 2021-08-21: 50 ug/min via INTRAVENOUS

## 2021-08-21 SURGICAL SUPPLY — 25 items

## 2021-08-21 NOTE — Interval H&P Note (Signed)
History and Physical Interval Note:  08/21/2021 9:25 AM  Charles [name]  has presented today for surgery, with the diagnosis of GI bleeding and weight loss.  The various methods of treatment have been discussed with the patient and family. After consideration of risks, benefits and other options for treatment, the patient has consented to  Procedure(s): ESOPHAGOGASTRODUODENOSCOPY (EGD) WITH PROPOFOL (N/A) COLONOSCOPY WITH PROPOFOL (N/A) as a surgical intervention.  The patient's history has been reviewed, patient examined, no change in status, stable for surgery.  I have reviewed the patient's chart and labs.  Questions were answered to the patient's satisfaction.     Jenel Lucks

## 2021-08-21 NOTE — Progress Notes (Signed)
PROGRESS NOTE  Charles [name] HYW:[id number] DOB: [birth year]   PCP: Pearline Cables, MD  Patient is from: Home.  Lives alone.  Uses rollator at baseline.  DOA: 08/19/2021 LOS: 2  Chief complaints Chief Complaint  Patient presents with   Rectal Bleeding     Brief Narrative / Interim history: 61 year old M with PMH of IDDM-2, CAD/CABG, BPH/urinary retention with chronic foley, hypotension on midodrine, severe malnutrition, hemorrhoids, GERD and mild cognitive impairment and HLD presenting with hematochezia for about a week, and admitted for possible lower GI bleed.  Hgb 10.5 (about baseline).  He also had over 60 pound weight loss in 6 months.  GI consulted.  Next day, Hgb down to 7.7.  Reportedly had a large stool with dark blood.  He was transfused 1 unit with appropriate response.  EGD on 6/11 basically normal.  Colonoscopy showed hemorrhoid, polyp and diverticulosis in ascending colon and solitary rectal ulcer felt to be due to constipation.   Subjective: Seen and examined this afternoon after he returned from EGD and colonoscopy.  No major events overnight of this morning.  No complaints at this time.  Feels hungry and thirsty.  Denies chest pain, dizziness, shortness of breath, nausea or vomiting.  Objective: Vitals:   08/21/21 1028 08/21/21 1042 08/21/21 1058 08/21/21 1200  BP: 121/60 (!) 96/52 (!) 96/56 95/63  Pulse: 71 68 74   Resp: 15 16 16    Temp:   98.1 F (36.7 C)   TempSrc:   Oral   SpO2: 100% 100% 95%   Weight:      Height:        Examination:  GENERAL: Very frail. HEENT: MMM.  Vision and hearing grossly intact.  NECK: Supple.  No apparent JVD.  RESP:  No IWOB.  Fair aeration bilaterally. CVS:  RRR. Heart sounds normal.  ABD/GI/GU: BS+. Abd soft, NTND.  Indwelling Foley catheter. MSK/EXT:  Moves extremities.  Significant muscle mass and subcu fat loss. SKIN: no apparent skin lesion or wound NEURO: Awake and alert. Oriented appropriately.  No apparent  focal neuro deficit. PSYCH: Calm. Normal affect.   Procedures:  None  Microbiology summarized: None  Assessment and plan Principal Problem:   Acute lower GI bleeding Active Problems:   Uncontrolled IDDM-2 with hyperglycemia   Acute blood loss anemia   CAD S/p remote CABG   BPH (benign prostatic hyperplasia)   HLD (hyperlipidemia)   Hypotension   Protein-calorie malnutrition, severe (HCC)   Hypomagnesemia   Physical deconditioning   Unintentional weight loss of more than 10% body weight within 6 months   Hemorrhoids   Diverticulosis of colon   Solitary rectal ulcer   Polyp of ascending colon  Symptomatic ABLA due to acute lower GI bleed: hematochezia for about a week.  Over 60 pounds weight loss in 6 months.  CT abdomen and pelvis without significant finding.  EGD normal.  Colonoscopy with hemorrhoid, a polyp and diverticulosis in the ascending colon and solitary rectal ulcer.  Hgb reached nadir at 7.7.  Transfused 1 unit with appropriate response.  Anemia panel consistent with anemia of chronic disease Recent Labs    07/05/21 0124 07/06/21 0118 07/07/21 0147 08/19/21 1820 08/20/21 0050 08/20/21 0237 08/20/21 0855 08/20/21 1548 08/21/21 0307 08/21/21 1057  HGB 11.1* 10.5* 10.3* 10.5* 8.9* 8.1* 7.8* 7.7* 8.7* 8.1*  -Stop IV fluid -Monitor H&H.  Transfuse for Hgb <8.0.  -Full liquid diet -Continue holding aspirin -SCD for VTE prophylaxis  Diverticulosis/solitary rectal ulcer/polyps of ascending colon/hemorrhoids -Bowel regimen  to avoid constipation  Uncontrolled IDDM-2 with hyperglycemia: A1c is 10.4% on 07/03/2021.  On Lantus 14 units, sliding scale and metformin at home. Recent Labs  Lab 08/20/21 2333 08/21/21 0358 08/21/21 0852 08/21/21 1027 08/21/21 1108  GLUCAP 146* 155* 79 123* 117*  -Continue SSI-sensitive while n.p.o. -CBG monitoring -Continue home statin.  History of CAD/remote CABG: Stable.  No cardiopulmonary symptoms. -Continue home medications  except aspirin  Hypotension: He takes midodrine 10 mg daily although it was prescribed 3 times daily -Continue midodrine 10 mg 3 times daily.  BPH/urinary retention with chronic Foley: he was on oxybutynin at some point.  Seems he was started on Flomax in April.  Seen by urology on 5/3.  Plan was to change Foley every month starting 07/30/2021 and reassess urodynamics and follow-up with Dr. Ronne Binning post study.  It seems like urology was not able to reach patient to arrange for urodynamics. He has no phone. CT abdomen did not show prostamegaly -Continue Foley catheter -Resume Flomax. -We will arrange outpatient follow-up with urology before discharge.  History of GERD: EGD normal -Continue PPI  Hypokalemia/hypomagnesemia: K3.2.  Mg 1.8. -IV KCl 40x1 -P.o. KCl 40x1 after scope  Mild cognitive impairment: Seem to be fairly oriented. -Reorientation and delirium precautions  Severe malnutrition/unintentional weight loss: Body mass index is 14.53 kg/m.  Seems to have lost 25 kg in the last 5 months.  Significant muscle mass and subcu fat loss.  No explanation on EGD, Colo, CT abdomen and pelvis. Wt Readings from Last 10 Encounters:  08/19/21 40.8 kg  07/13/21 54.2 kg  07/06/21 54.2 kg  06/06/21 56 kg  05/09/21 58.9 kg  05/05/21 58 kg  05/04/21 56.6 kg  04/24/21 58 kg  04/13/21 61.9 kg  04/07/21 66.5 kg  -Dietitian consulted  Physical deconditioning -PT/OT eval  DVT prophylaxis:  SCDs Start: 08/19/21 2220 in the setting of GI bleed  Code Status: Full code Family Communication: Updated patient's sister over the phone.  She lives in [place]. Level of care: Progressive Status is: Inpatient Remains inpatient appropriate because: Due to acute blood loss anemia in the setting of GI bleed   Final disposition: TBD Consultants:  Gastroenterology  Sch Meds:  Scheduled Meds:  Chlorhexidine Gluconate Cloth  6 each Topical Daily   ezetimibe  10 mg Oral Daily   feeding supplement  1  Container Oral TID BM   gabapentin  300 mg Oral QHS   insulin aspart  0-9 Units Subcutaneous Q4H   mouth rinse  15 mL Mouth Rinse BID   midodrine  10 mg Oral TID WC   mirtazapine  7.5 mg Oral QHS   [START ON 08/22/2021] pantoprazole  40 mg Oral Daily   QUEtiapine  25 mg Oral QHS   rosuvastatin  40 mg Oral Daily   tamsulosin  0.4 mg Oral QPC supper   Continuous Infusions:  lactated ringers 10 mL/hr at 08/21/21 0845   PRN Meds:.acetaminophen **OR** acetaminophen  Antimicrobials: Anti-infectives (From admission, onward)    None        I have personally reviewed the following labs and images: CBC: Recent Labs  Lab 08/19/21 1820 08/20/21 0050 08/20/21 0237 08/20/21 0855 08/20/21 1548 08/21/21 0307 08/21/21 1057  WBC 10.3  --   --  9.9 11.0* 7.1  --   NEUTROABS 8.2*  --   --   --   --   --   --   HGB 10.5*   < > 8.1* 7.8* 7.7* 8.7* 8.1*  HCT  32.0*   < > 24.8* 23.6* 23.9* 25.9* 24.0*  MCV 89.1  --   --  88.1 87.5 84.1  --   PLT 340  --   --  293 333 260  --    < > = values in this interval not displayed.   BMP &GFR Recent Labs  Lab 08/19/21 1820 08/20/21 0600 08/20/21 0646 08/21/21 0307  NA 135  --  137 138  K 4.1  --  3.6 3.2*  CL 101  --  105 108  CO2 23  --  23 20*  GLUCOSE 256*  --  170* 144*  BUN 13  --  10 10  CREATININE 0.75  --  0.72 0.74  CALCIUM 8.7*  --  8.1* 7.8*  MG  --  1.2*  --  1.8  PHOS  --   --  3.0 2.9   Estimated Creatinine Clearance: 58.1 mL/min (by C-G formula based on SCr of 0.74 mg/dL). Liver & Pancreas: Recent Labs  Lab 08/19/21 1820 08/20/21 0646 08/21/21 0307  AST 21  --   --   ALT 13  --   --   ALKPHOS 102  --   --   BILITOT 0.4  --   --   PROT 6.5  --   --   ALBUMIN 2.5* 2.1* 1.9*   No results for input(s): "LIPASE", "AMYLASE" in the last 168 hours. No results for input(s): "AMMONIA" in the last 168 hours. Diabetic: No results for input(s): "HGBA1C" in the last 72 hours. Recent Labs  Lab 08/20/21 2333  08/21/21 0358 08/21/21 0852 08/21/21 1027 08/21/21 1108  GLUCAP 146* 155* 79 123* 117*   Cardiac Enzymes: No results for input(s): "CKTOTAL", "CKMB", "CKMBINDEX", "TROPONINI" in the last 168 hours. No results for input(s): "PROBNP" in the last 8760 hours. Coagulation Profile: No results for input(s): "INR", "PROTIME" in the last 168 hours. Thyroid Function Tests: Recent Labs    08/20/21 1548  TSH 3.208   Lipid Profile: No results for input(s): "CHOL", "HDL", "LDLCALC", "TRIG", "CHOLHDL", "LDLDIRECT" in the last 72 hours. Anemia Panel: Recent Labs    08/20/21 0600 08/20/21 0646 08/20/21 1548  VITAMINB12 350  --   --   FOLATE  --  6.4  --   FERRITIN 290  --   --   TIBC 209*  --   --   IRON 26*  --   --   RETICCTPCT  --   --  1.7   Urine analysis:    Component Value Date/Time   COLORURINE YELLOW 06/30/2021 2023   APPEARANCEUR CLOUDY (A) 06/30/2021 2023   APPEARANCEUR Hazy (A) 05/09/2021 1006   LABSPEC 1.010 06/30/2021 2023   PHURINE 5.0 06/30/2021 2023   GLUCOSEU >=500 (A) 06/30/2021 2023   HGBUR MODERATE (A) 06/30/2021 2023   BILIRUBINUR NEGATIVE 06/30/2021 2023   BILIRUBINUR Negative 05/09/2021 1006   KETONESUR 80 (A) 06/30/2021 2023   PROTEINUR NEGATIVE 06/30/2021 2023   NITRITE POSITIVE (A) 06/30/2021 2023   LEUKOCYTESUR LARGE (A) 06/30/2021 2023   Sepsis Labs: Invalid input(s): "PROCALCITONIN", "LACTICIDVEN"  Microbiology: No results found for this or any previous visit (from the past 240 hour(s)).  Radiology Studies: No results found.    Shanece Cochrane T. Tatisha Cerino [name]  If 7PM-7AM, please contact night-coverage [email or web address] 08/21/2021, 3:56 PM

## 2021-08-21 NOTE — Progress Notes (Signed)
Patient has been NPO except for bowel prep, the most recent stool was mostly liquid, a few blood clots noted, orange red color. Fecal system has been removed. At this present time patient is alert and oriented x4. Consents are signed and on the chart.

## 2021-08-21 NOTE — Op Note (Signed)
[facility] Patient Name: Charles [name] Procedure Date : 08/21/2021 MRN: [id number] Attending MD: Gladstone Pih. Candis Schatz , MD Date of Birth: 04-May-1960 CSN: [id number] Age: 61 Admit Type: Inpatient Procedure:                Upper GI endoscopy Indications:              Iron deficiency anemia, Weight loss Providers:                Nicki Reaper E. Candis Schatz, MD, Jeanella Cara, RN,                            Despina Pole, Technician, Lance Coon, CRNA Referring MD:              Medicines:                Monitored Anesthesia Care Complications:            No immediate complications. Estimated Blood Loss:     Estimated blood loss: none. Procedure:                Pre-Anesthesia Assessment:                           - Prior to the procedure, a History and Physical                            was performed, and patient medications and                            allergies were reviewed. The patient's tolerance of                            previous anesthesia was also reviewed. The risks                            and benefits of the procedure and the sedation                            options and risks were discussed with the patient.                            All questions were answered, and informed consent                            was obtained. Prior Anticoagulants: The patient has                            taken no previous anticoagulant or antiplatelet                            agents. ASA Grade Assessment: III - A patient with                            severe systemic disease. After reviewing the risks  and benefits, the patient was deemed in                            satisfactory condition to undergo the procedure.                           After obtaining informed consent, the endoscope was                            passed under direct vision. Throughout the                            procedure, the patient's blood pressure, pulse,  and                            oxygen saturations were monitored continuously. The                            [id number] Olympus endoscope was introduced                            through the mouth, and advanced to the second part                            of duodenum. The [id number] Olympus                            colonoscope was introduced through the and advanced                            to the. The upper GI endoscopy was accomplished                            without difficulty. The patient tolerated the                            procedure well. Scope In: [id number] AM Scope Out: 10:03:07 AM Scope Withdrawal Time: 0 hours 17 minutes 39 seconds  Total Procedure Duration: 0 hours 26 minutes 33 seconds  Findings:      The examined portions of the nasopharynx, oropharynx and larynx were       normal.      The examined esophagus was normal.      The entire examined stomach was normal.      The examined duodenum was normal. Impression:               - The examined portions of the nasopharynx,                            oropharynx and larynx were normal.                           - Normal esophagus.                           - Normal  stomach.                           - Normal examined duodenum.                           - No specimens collected.                           - No abnormalities to explain anemia or                            unintentional weight loss. Recommendation:           - Return patient to hospital ward for ongoing care.                           - Resume previous diet.                           - Continue present medications.                           - Proceed with colonoscopy Procedure Code(s):        --- Professional ---                           [phone], Esophagogastroduodenoscopy, flexible,                            transoral; diagnostic, including collection of                            specimen(s) by brushing or washing, when  performed                            (separate procedure) Diagnosis Code(s):        --- Professional ---                           D50.9, Iron deficiency anemia, unspecified                           R63.4, Abnormal weight loss CPT copyright 2019 [employer]. All rights reserved. The codes documented in this report are preliminary and upon coder review may  be revised to meet current compliance requirements. Charles [name] E. Candis Schatz, MD 08/21/2021 10:09:30 AM This report has been signed electronically. Number of Addenda: 0

## 2021-08-21 NOTE — Therapy (Signed)
OT Cancellation Note  Patient Details Name: Charles [name] MRN: [id number] DOB: 1960/08/28   Cancelled Treatment:    Reason Eval/Treat Not Completed: Patient at procedure or test/ unavailable  Daryl Eastern 08/21/2021, 3:11 PM

## 2021-08-21 NOTE — Transfer of Care (Signed)
Immediate Anesthesia Transfer of Care Note  [name]  Procedure(s) Performed: ESOPHAGOGASTRODUODENOSCOPY (EGD) WITH PROPOFOL COLONOSCOPY WITH PROPOFOL POLYPECTOMY  Patient Location: Endoscopy Unit  Anesthesia Type:MAC  Level of Consciousness: drowsy and patient cooperative  Airway & Oxygen Therapy: Patient Spontanous Breathing  Post-op Assessment: Report given to RN and Post -op Vital signs reviewed and stable  Post vital signs: Reviewed and stable  Last Vitals:  Vitals Value Taken Time  BP    Temp    Pulse    Resp    SpO2      Last Pain:  Vitals:   08/21/21 0829  TempSrc: Temporal  PainSc: 0-No pain         Complications: No notable events documented.

## 2021-08-21 NOTE — Op Note (Addendum)
[facility] Patient Name: Charles [name] Procedure Date : 08/21/2021 MRN: [id number] Attending MD: Gladstone Pih. Candis Schatz , MD Date of Birth: [birth year] CSN: [id number] Age: 61 Admit Type: Inpatient Procedure:                Colonoscopy Indications:              Hematochezia, Iron deficiency anemia, Weight loss Providers:                Nicki Reaper E. Candis Schatz, MD, Jeanella Cara, RN,                            Despina Pole, Technician, Lance Coon, CRNA Referring MD:              Medicines:                Monitored Anesthesia Care Complications:            No immediate complications. Estimated Blood Loss:     Estimated blood loss was minimal. Procedure:                Pre-Anesthesia Assessment:                           - Prior to the procedure, a History and Physical                            was performed, and patient medications and                            allergies were reviewed. The patient's tolerance of                            previous anesthesia was also reviewed. The risks                            and benefits of the procedure and the sedation                            options and risks were discussed with the patient.                            All questions were answered, and informed consent                            was obtained. Prior Anticoagulants: The patient has                            taken no previous anticoagulant or antiplatelet                            agents. ASA Grade Assessment: III - A patient with                            severe systemic disease. After reviewing the risks  and benefits, the patient was deemed in                            satisfactory condition to undergo the procedure.                           After obtaining informed consent, the colonoscope                            was passed under direct vision. Throughout the                            procedure, the patient's blood  pressure, pulse, and                            oxygen saturations were monitored continuously. The                            PCF-HQ190L ([id number]) Olympus colonoscope was                            introduced through the anus and advanced to the the                            terminal ileum, with identification of the                            appendiceal orifice and IC valve. The colonoscopy                            was performed without difficulty. The patient                            tolerated the procedure well. The quality of the                            bowel preparation was good except the rectum was                            poor. The terminal ileum, ileocecal valve,                            appendiceal orifice, and rectum were photographed. Scope In: 9:36:58 AM Scope Out: 10:02:05 AM Scope Withdrawal Time: 0 hours 16 minutes 54 seconds  Total Procedure Duration: 0 hours 25 minutes 7 seconds  Findings:      Hemorrhoids were found on perianal exam.      The digital rectal exam findings include large amount of solid stool.       Pertinent negatives include normal sphincter tone.      A 8 mm polyp was found in the ascending colon. The polyp was sessile.       The polyp was removed with a cold snare. Resection and retrieval were       complete. Estimated blood loss was minimal.  A few medium-mouthed diverticula were found in the ascending colon and       cecum.      A single (solitary) four mm ulcer was found in the distal rectum, at the       anal verge. No bleeding was present. No stigmata of recent bleeding were       seen in the ulcer although there was clotted blood in the rectum.      Clotted blood was found in the rectum. This was removed with suction      A large amount of solid stool was found in the rectum, precluding       visualization of about 1/3 of the rectum. Attempts to remove the stool       with Jabier Mutton net were unsuccessful due to the large amount  stool remaining      The exam was otherwise normal throughout the examined colon.      The terminal ileum appeared normal.      Non-bleeding internal hemorrhoids and hypertrophied anal papilla were       found during retroflexion. The hemorrhoids were large.      No additional abnormalities were found on retroflexion. Impression:               - Hemorrhoids found on perianal exam.                           - Large amount of solid stool found on digital                            rectal exam.                           - One 8 mm polyp in the ascending colon, removed                            with a cold snare. Resected and retrieved.                           - Diverticulosis in the ascending colon and in the                            cecum.                           - A single (solitary) ulcer in the distal rectum.                            This is likely a stercoral ulcer given the                            patient's report of constipation and difficulty                            evacuating. This is the source of the patient's                            hematochezia and acute drop in hemoglobin, although  a right sided diverticular bleed could also have                            occurred. The diverticula visualized had no                            bleeding stigmata and there was no blood seen                            outside of the rectum.                           - Blood in the rectum.                           - Stool in the rectum.                           - The examined portion of the ileum was normal.                           - Non-bleeding internal hemorrhoids and prominent                            hypertrophied anal papilla.                           - No findings to explain unintentional weight loss. Moderate Sedation:      none/MAC Recommendation:           - Return patient to hospital ward for ongoing care.                           -  Resume previous diet.                           - Continue present medications.                           - Await pathology results.                           - Important to avoid hard stools and straining to                            reduce bleeding from hemorrhoids and ulcer                           - Recommend regular bowel regimen consisting of                            daily Metamucil qam, MiraLax qpm, and Senna or                            docusate PRN if no bowel movement in 2 days.                           -  There was a portion of the rectum that was not                            visualized at all due to massive solid stool                            burden. A flexible sigmoidoscopy should be                            considered at some point to completely clear the                            rectum of polyps/masses. Procedure Code(s):        --- Professional ---                           [phone], Colonoscopy, flexible; with removal of                            tumor(s), polyp(s), or other lesion(s) by snare                            technique Diagnosis Code(s):        --- Professional ---                           K64.8, Other hemorrhoids                           K63.5, Polyp of colon                           K62.6, Ulcer of anus and rectum                           K62.5, Hemorrhage of anus and rectum                           K92.1, Melena (includes Hematochezia)                           D50.9, Iron deficiency anemia, unspecified                           R63.4, Abnormal weight loss                           K57.30, Diverticulosis of large intestine without                            perforation or abscess without bleeding CPT copyright 2019 American Medical Association. All rights reserved. The codes documented in this report are preliminary and upon coder review may  be revised to meet current compliance requirements. Zarrah Loveland E. Candis Schatz, MD 08/21/2021 10:28:31  AM This report has been signed electronically. Number of Addenda: 0

## 2021-08-21 NOTE — Anesthesia Postprocedure Evaluation (Signed)
Anesthesia Post Note  Patient: Kaylin Schellenberg  Procedure(s) Performed: ESOPHAGOGASTRODUODENOSCOPY (EGD) WITH PROPOFOL COLONOSCOPY WITH PROPOFOL POLYPECTOMY     Patient location during evaluation: Endoscopy Anesthesia Type: MAC Level of consciousness: awake and alert, patient cooperative and oriented Pain management: pain level controlled Vital Signs Assessment: post-procedure vital signs reviewed and stable Respiratory status: nonlabored ventilation, spontaneous breathing and respiratory function stable Cardiovascular status: blood pressure returned to baseline and stable Postop Assessment: no apparent nausea or vomiting Anesthetic complications: no   No notable events documented.  Last Vitals:  Vitals:   08/21/21 1042 08/21/21 1058  BP: (!) 96/52 (!) 96/56  Pulse: 68 74  Resp: 16 11  Temp:    SpO2: 100% 95%    Last Pain:  Vitals:   08/21/21 1028  TempSrc:   PainSc: 0-No pain                 Janette Harvie,E. Haunani Dickard

## 2021-08-22 ENCOUNTER — Inpatient Hospital Stay (HOSPITAL_COMMUNITY): Payer: Commercial Managed Care - HMO

## 2021-08-22 DIAGNOSIS — E119 Type 2 diabetes mellitus without complications: Secondary | ICD-10-CM | POA: Diagnosis not present

## 2021-08-22 DIAGNOSIS — D62 Acute posthemorrhagic anemia: Secondary | ICD-10-CM | POA: Diagnosis not present

## 2021-08-22 DIAGNOSIS — K922 Gastrointestinal hemorrhage, unspecified: Secondary | ICD-10-CM | POA: Diagnosis not present

## 2021-08-22 DIAGNOSIS — I951 Orthostatic hypotension: Secondary | ICD-10-CM

## 2021-08-22 DIAGNOSIS — N401 Enlarged prostate with lower urinary tract symptoms: Secondary | ICD-10-CM | POA: Diagnosis not present

## 2021-08-22 LAB — CBC
HCT: 24.3 % — ABNORMAL LOW (ref 39.0–52.0)
Hemoglobin: 8.4 g/dL — ABNORMAL LOW (ref 13.0–17.0)
MCH: 28.9 pg (ref 26.0–34.0)
MCHC: 34.6 g/dL (ref 30.0–36.0)
MCV: 83.5 fL (ref 80.0–100.0)
Platelets: 259 10*3/uL (ref 150–400)
RBC: 2.91 MIL/uL — ABNORMAL LOW (ref 4.22–5.81)
RDW: 16.9 % — ABNORMAL HIGH (ref 11.5–15.5)
WBC: 6.3 10*3/uL (ref 4.0–10.5)
nRBC: 0 % (ref 0.0–0.2)

## 2021-08-22 LAB — GLUCOSE, CAPILLARY
Glucose-Capillary: 129 mg/dL — ABNORMAL HIGH (ref 70–99)
Glucose-Capillary: 154 mg/dL — ABNORMAL HIGH (ref 70–99)
Glucose-Capillary: 205 mg/dL — ABNORMAL HIGH (ref 70–99)
Glucose-Capillary: 236 mg/dL — ABNORMAL HIGH (ref 70–99)
Glucose-Capillary: 207 mg/dL — ABNORMAL HIGH (ref 70–99)

## 2021-08-22 LAB — RENAL FUNCTION PANEL
Albumin: 2 g/dL — ABNORMAL LOW (ref 3.5–5.0)
Anion gap: 4 — ABNORMAL LOW (ref 5–15)
BUN: 6 mg/dL (ref 6–20)
CO2: 23 mmol/L (ref 22–32)
Calcium: 8.3 mg/dL — ABNORMAL LOW (ref 8.9–10.3)
Chloride: 112 mmol/L — ABNORMAL HIGH (ref 98–111)
Creatinine, Ser: 0.66 mg/dL (ref 0.61–1.24)
GFR, Estimated: >60 mL/min (ref >60)
Glucose, Bld: 193 mg/dL — ABNORMAL HIGH (ref 70–99)
Phosphorus: 2.3 mg/dL — ABNORMAL LOW (ref 2.5–4.6)
Potassium: 3.9 mmol/L (ref 3.5–5.1)
Sodium: 139 mmol/L (ref 135–145)

## 2021-08-22 LAB — TSH: TSH: 3.784 u[IU]/mL (ref 0.350–4.500)

## 2021-08-22 LAB — PREALBUMIN: Prealbumin: 9.2 mg/dL — ABNORMAL LOW (ref 18–38)

## 2021-08-22 LAB — HIV ANTIBODY (ROUTINE TESTING W REFLEX): HIV Screen 4th Generation wRfx: NONREACTIVE

## 2021-08-22 LAB — MAGNESIUM: Magnesium: 1.6 mg/dL — ABNORMAL LOW (ref 1.7–2.4)

## 2021-08-22 IMAGING — MR MR HEAD WO/W CM
9 of 13 series · 33 of 48 positions shown · IV contrast (4.5ml gadavist)
Comparison: Prior study from [DATE].

CLINICAL DATA: Initial evaluation for brain/CNS neoplasm, staging.

EXAM:
MRI HEAD WITHOUT AND WITH CONTRAST
TECHNIQUE: Multiplanar, multiecho pulse sequences of the brain and surrounding
structures were obtained without and with intravenous contrast.
CONTRAST:  4.5mL GADAVIST GADOBUTROL 1 MMOL/ML IV SOLN

[Series 3: DWI · axial · 3.0mm · 1.09mm/px · z∈[-71,+67]mm · 8 of 94 slices shown (1 of 4)]
[im 1/94]
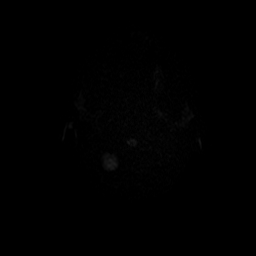
[im 14/94]
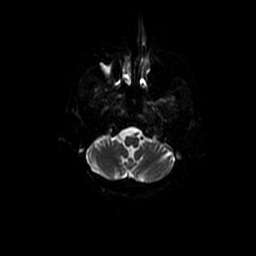
[im 27/94]
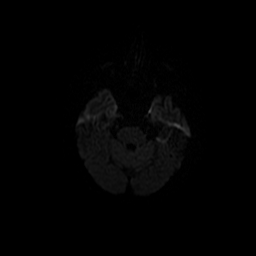
[im 40/94]
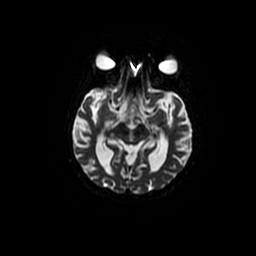
[im 54/94]
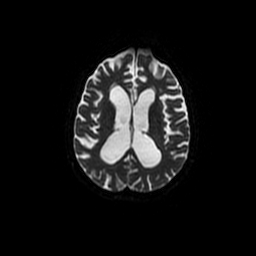
[im 67/94]
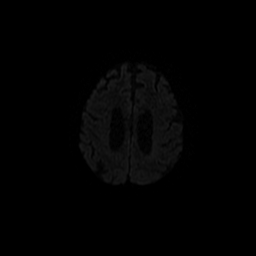
[im 80/94]
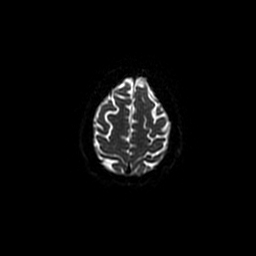
[im 94/94]
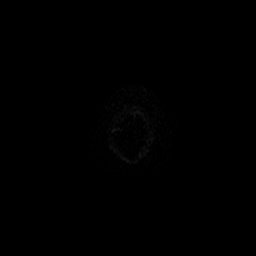

[Series 4: DWI · coronal · 5.0mm · 1.09mm/px · 6 of 64 slices shown (2 of 4)]
[im 1/64]
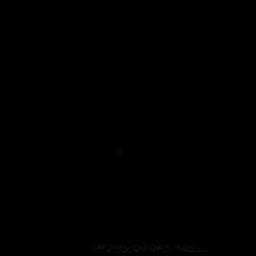
[im 13/64]
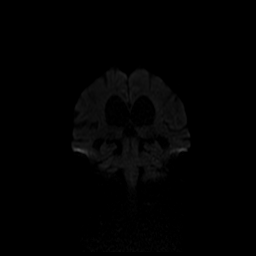
[im 26/64]
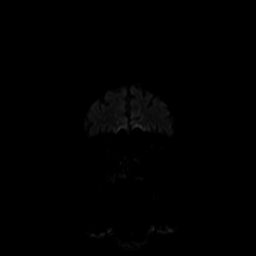
[im 38/64]
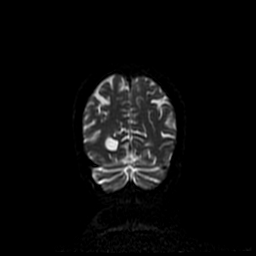
[im 51/64]
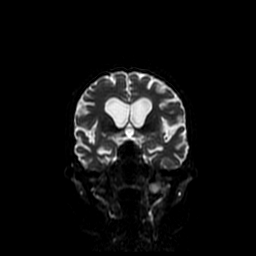
[im 64/64]
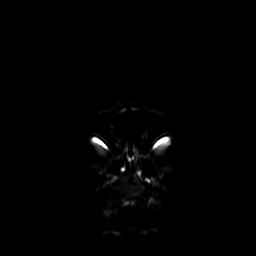

[Series 6: T2 · axial · 5.0mm · 0.43mm/px · z∈[-70,+67]mm · 2 of 24 slices shown]
[im 1/24]
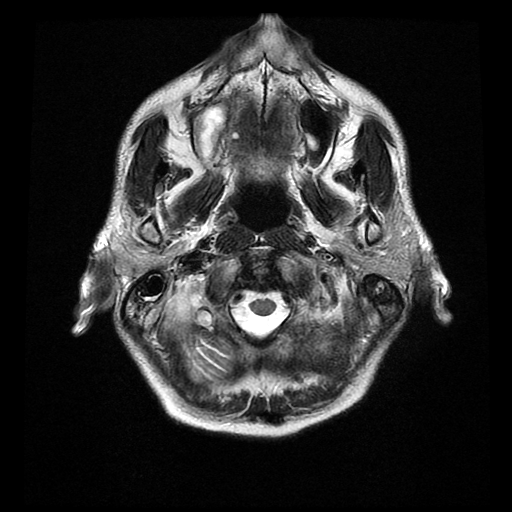
[im 24/24]
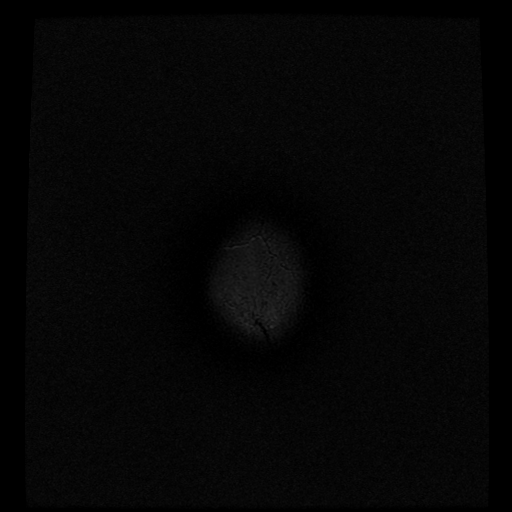

[Series 7: FLAIR · axial · 3.0mm · 0.43mm/px · z∈[-72,+66]mm · 2 of 24 slices shown]
[im 1/24]
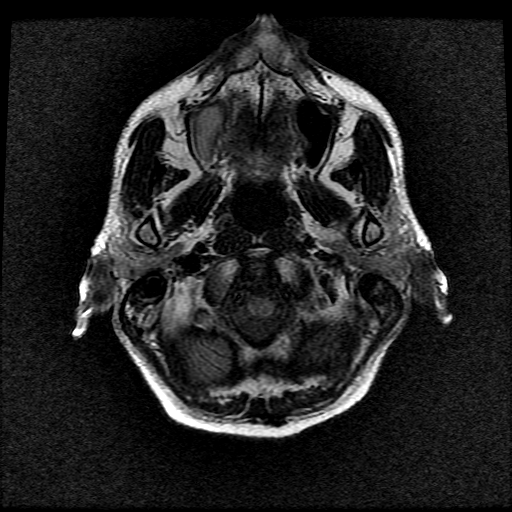
[im 24/24]
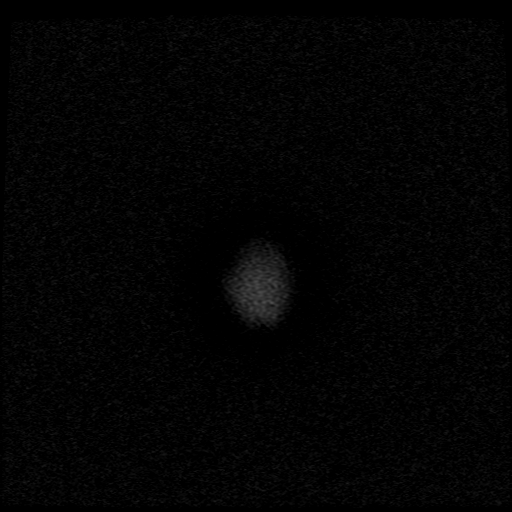

[Series 11: T1 post-contrast · axial · 3.0mm · 0.47mm/px · z∈[-77,+70]mm · 4 of 50 slices shown (1 of 3)]
[im 1/50]
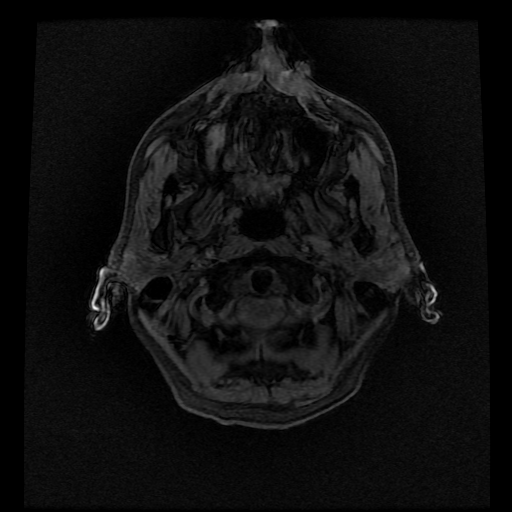
[im 17/50]
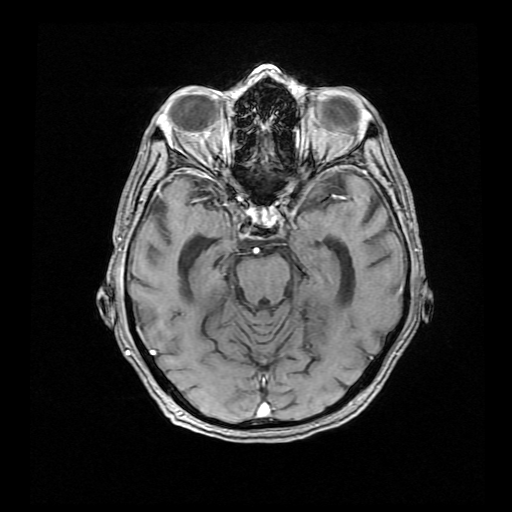
[im 33/50]
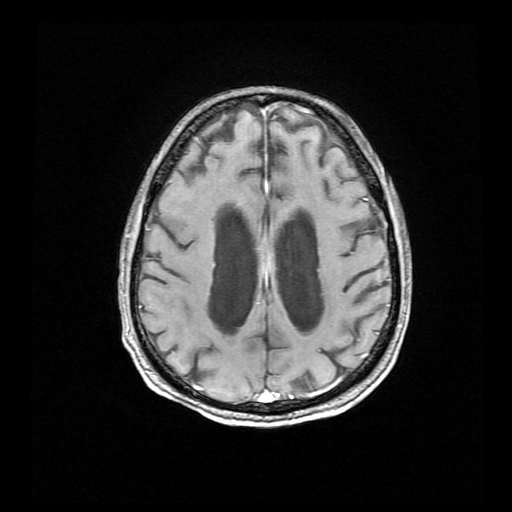
[im 50/50]
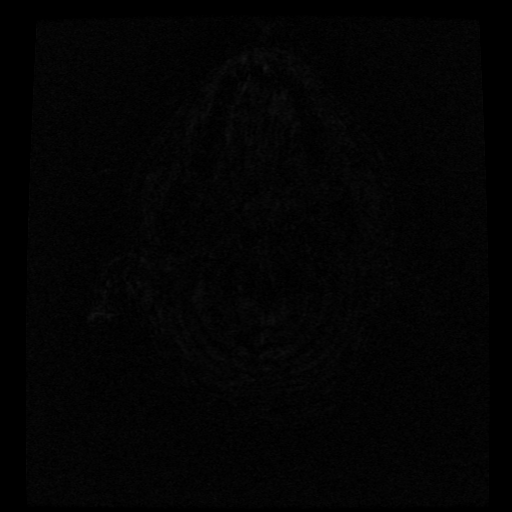

[Series 12: T1 post-contrast · coronal · 5.0mm · 0.39mm/px · 2 of 25 slices shown (2 of 3)]
[im 1/25]
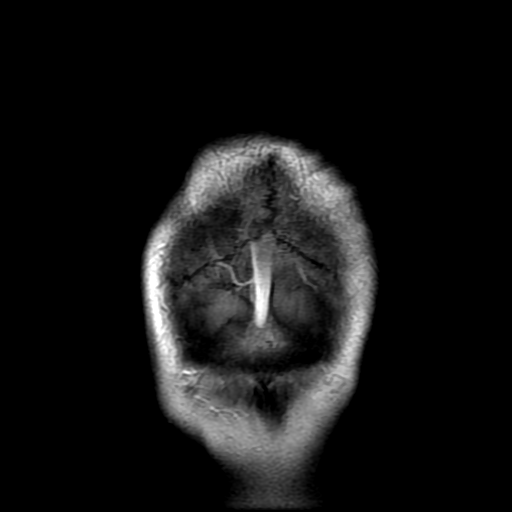
[im 25/25]
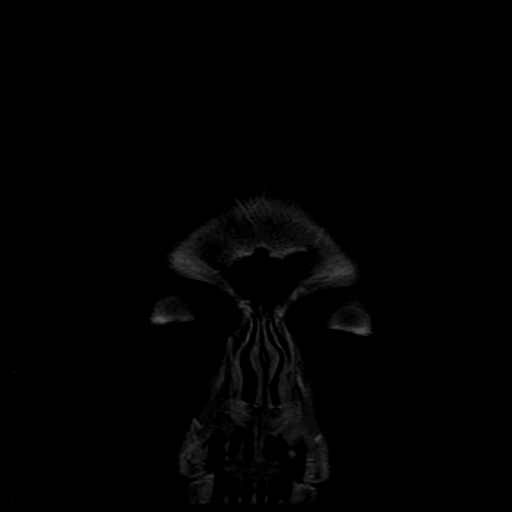

[Series 13: T1 post-contrast · sagittal · 5.0mm · 0.47mm/px · 2 of 24 slices shown (3 of 3)]
[im 1/24]
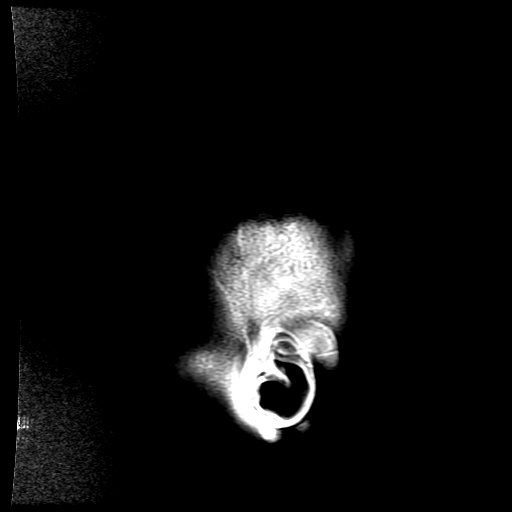
[im 24/24]
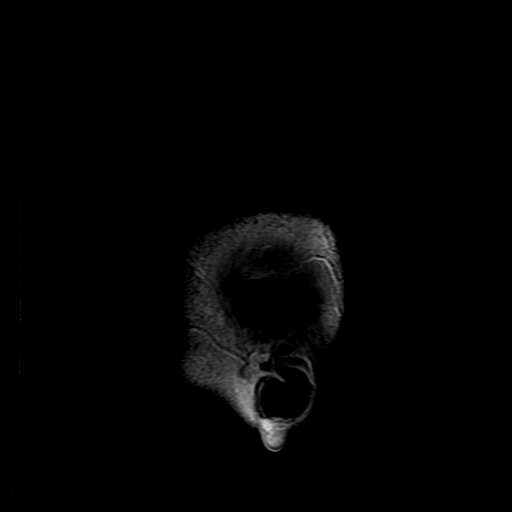

[Series 300: DWI · axial · 3.0mm · 1.09mm/px · z∈[-71,+67]mm · 4 of 47 slices shown (3 of 4)]
[im 1/47]
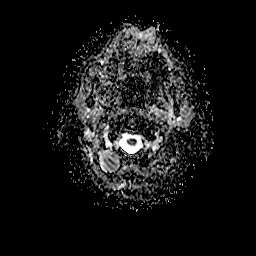
[im 16/47]
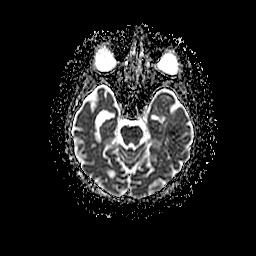
[im 31/47]
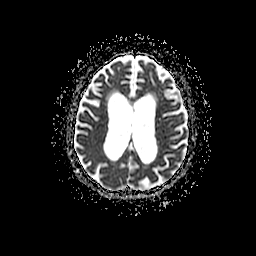
[im 47/47]
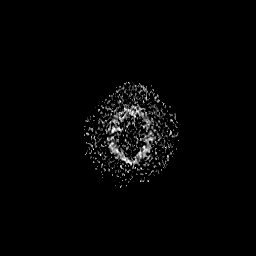

[Series 400: DWI · coronal · 5.0mm · 1.09mm/px · 3 of 32 slices shown (4 of 4)]
[im 1/32]
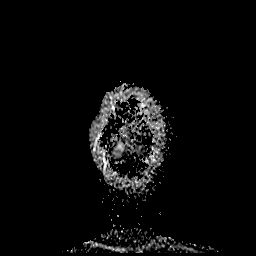
[im 16/32]
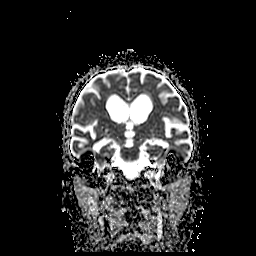
[im 32/32]
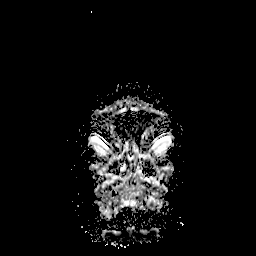

[33 of 48 positions shown; findings below may reference images not displayed]

FINDINGS: Brain: Age-related cerebral volume loss, stable. Small remote
lacunar infarct present at the right basal ganglia. Hyperintensity
mild T2/FLAIR hyperintensity involving periventricular white matter,
stable.

No evidence for acute or subacute ischemia. Gray-white matter
differentiation maintained. No areas of remote cortical infarction.
No acute intracranial hemorrhage. Few subtle punctate chronic micro
hemorrhages noted about the periventricular white matter, likely
small vessel/hypertensive in nature.

5 mm colloid cyst at the foramen of KARI noted, stable. Diffuse
ventriculomegaly again seen, somewhat out of proportion to cortical
sulcation, similar to previous. No other mass lesion, mass effect,
or midline shift. No extra-axial fluid collection. No abnormal
enhancement. Pituitary gland suprasellar region normal.

Vascular: Major intracranial vascular flow voids are maintained.

Skull and upper cervical spine: Craniocervical junction within
normal limits. Bone marrow signal intensity normal. No scalp soft
tissue abnormality.

Sinuses/Orbits: Prior bilateral ocular lens replacement. Scattered
mucosal thickening noted about the ethmoidal air cells and maxillary
sinuses. Small right mastoid effusion noted.

Other: None.
IMPRESSION: 1. No acute intracranial abnormality.
2. 5 mm colloid cyst at the foramen of KARI, stable.
3. Ventriculomegaly with mild periventricular T2/FLAIR signal
abnormality, stable.
4. Small remote right basal ganglia lacunar infarct.

## 2021-08-22 MED ORDER — ROSUVASTATIN CALCIUM 5 MG PO TABS
10.0000 mg | ORAL_TABLET | Freq: Every day | ORAL | Status: DC
  Administered 2021-08-23 – 2021-08-31 (×9): 10 mg via ORAL
  Filled 2021-08-22 (×9): qty 2

## 2021-08-22 MED ORDER — GADOBUTROL 1 MMOL/ML IV SOLN
4.5000 mL | Freq: Once | INTRAVENOUS | Status: AC | PRN
Start: 2021-08-22 — End: 2021-08-22
  Administered 2021-08-22: 4.5 mL via INTRAVENOUS

## 2021-08-22 MED ORDER — HYDROCORT-PRAMOXINE (PERIANAL) 1-1 % EX FOAM
1.0000 | Freq: Four times a day (QID) | CUTANEOUS | Status: DC | PRN
  Administered 2021-08-23: 1 via RECTAL
  Filled 2021-08-22: qty 10

## 2021-08-22 MED ORDER — INSULIN GLARGINE-YFGN 100 UNIT/ML ~~LOC~~ SOLN
10.0000 [IU] | Freq: Every day | SUBCUTANEOUS | Status: DC
Start: 2021-08-22 — End: 2021-08-23
  Administered 2021-08-22: 10 [IU] via SUBCUTANEOUS
  Filled 2021-08-22 (×3): qty 0.1

## 2021-08-22 MED ORDER — MAGNESIUM SULFATE 2 GM/50ML IV SOLN
2.0000 g | Freq: Once | INTRAVENOUS | Status: AC
  Administered 2021-08-22: 2 g via INTRAVENOUS
  Filled 2021-08-22: qty 50

## 2021-08-22 MED ORDER — PHENYLEPHRINE-MINERAL OIL-PET 0.25-14-74.9 % RE OINT
1.0000 "application " | TOPICAL_OINTMENT | Freq: Four times a day (QID) | RECTAL | Status: DC | PRN
  Filled 2021-08-22: qty 57

## 2021-08-22 MED ORDER — ADULT MULTIVITAMIN W/MINERALS CH
1.0000 | ORAL_TABLET | Freq: Every day | ORAL | Status: DC
  Administered 2021-08-22 – 2021-08-31 (×10): 1 via ORAL
  Filled 2021-08-22 (×10): qty 1

## 2021-08-22 MED ORDER — ENSURE ENLIVE PO LIQD
237.0000 mL | Freq: Two times a day (BID) | ORAL | Status: DC
  Administered 2021-08-23 – 2021-08-27 (×8): 237 mL via ORAL

## 2021-08-22 MED ORDER — GABAPENTIN 100 MG PO CAPS
100.0000 mg | ORAL_CAPSULE | Freq: Every day | ORAL | Status: DC
  Administered 2021-08-22 – 2021-08-25 (×4): 100 mg via ORAL
  Filled 2021-08-22 (×4): qty 1

## 2021-08-22 NOTE — TOC Initial Note (Signed)
Transition of Care [facility]) - Initial/Assessment Note    Patient Details  Name: Charles [name] MRN: [id number] Date of Birth: Jul 23, 1960  [facility] CM/SW Contact:    Mearl Latin, LCSW Phone Number: 08/22/2021, 3:53 PM  Clinical Narrative:                 Patient has been at [facility] for short term rehab and would need insurance approval to return. CSW will continue to follow.   Expected Discharge Plan: Skilled Nursing Facility Barriers to Discharge: Insurance Authorization, Continued Medical Work up   Patient Goals and CMS Choice Patient states their goals for this hospitalization and ongoing recovery are:: Rehab CMS Medicare.gov Compare Post Acute Care list provided to:: Patient Choice offered to / list presented to : Patient  Expected Discharge Plan and Services Expected Discharge Plan: Skilled Nursing Facility In-house Referral: Clinical Social Work   Post Acute Care Choice: Skilled Nursing Facility Living arrangements for the past 2 months: Single Family Home, Skilled Nursing Facility                                      Prior Living Arrangements/Services Living arrangements for the past 2 months: Single Family Home, Skilled Nursing Facility Lives with:: Self Patient language and need for interpreter reviewed:: Yes Do you feel safe going back to the place where you live?: Yes      Need for Family Participation in Patient Care: Yes (Comment) Care giver support system in place?: No (comment)   Criminal Activity/Legal Involvement Pertinent to Current Situation/Hospitalization: No - Comment as needed  Activities of Daily Living Home Assistive Devices/Equipment: Wheelchair, Environmental consultant (specify type), CBG Meter ADL Screening (condition at time of admission) Patient's cognitive ability adequate to safely complete daily activities?: Yes Is the patient deaf or have difficulty hearing?: No Does the patient have difficulty seeing, even when wearing  glasses/contacts?: No Does the patient have difficulty concentrating, remembering, or making decisions?: No Patient able to express need for assistance with ADLs?: Yes Does the patient have difficulty dressing or bathing?: Yes Independently performs ADLs?: No Communication: Independent Dressing (OT): Dependent Is this a change from baseline?: Pre-admission baseline Grooming: Dependent Is this a change from baseline?: Pre-admission baseline Feeding: Independent Bathing: Dependent Is this a change from baseline?: Pre-admission baseline Toileting: Dependent, Needs assistance Is this a change from baseline?: Pre-admission baseline In/Out Bed: Dependent, Needs assistance Is this a change from baseline?: Pre-admission baseline Walks in Home: Needs assistance, Dependent Is this a change from baseline?: Pre-admission baseline Does the patient have difficulty walking or climbing stairs?: Yes Weakness of Legs: Both Weakness of Arms/Hands: None  Permission Sought/Granted Permission sought to share information with : Facility Medical sales representative, Family Supports Permission granted to share information with : Yes, Verbal Permission Granted  Share Information with NAME: Danise Mina  Permission granted to share info w AGENCY: [facility]  Permission granted to share info w Relationship: Sister  Permission granted to share info w Contact Information: [phone]  Emotional Assessment Appearance:: Appears stated age Attitude/Demeanor/Rapport: Unable to Assess Affect (typically observed): Unable to Assess Orientation: : Oriented to Self, Oriented to Place, Oriented to  Time, Oriented to Situation Alcohol / Substance Use: Not Applicable Psych Involvement: No (comment)  Admission diagnosis:  Rectal bleeding [K62.5] Acute lower GI bleeding [K92.2] Patient Active Problem List   Diagnosis Date Noted   Unintentional weight loss of more than  10% body weight within 6 months 08/21/2021    Hemorrhoids 08/21/2021   Diverticulosis of colon 08/21/2021   Solitary rectal ulcer 08/21/2021   Polyp of ascending colon 08/21/2021   Hypokalemia 08/21/2021   Acute blood loss anemia 08/20/2021   Hypotension 08/20/2021   Protein-calorie malnutrition, severe (HCC) 08/20/2021   Hypomagnesemia 08/20/2021   Physical deconditioning 08/20/2021   Acute lower GI bleeding 08/19/2021   Pressure injury of skin 07/02/2021   Hypothermia 06/30/2021   Urinary retention 04/23/2021   Acute prerenal azotemia 04/22/2021   HLD (hyperlipidemia) 04/22/2021   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/23/2021   Uncontrolled IDDM-2 with hyperglycemia 03/23/2021   BPH (benign prostatic hyperplasia) 02/08/2021   Phimosis 01/27/2021   Hypertension 12/23/2020   CAD S/p remote CABG 12/23/2020   PCP:  Pearline Cables, MD Pharmacy:   [id number], [place] - [address] [place] [address] Phone: [phone] Fax: [phone]     Social Determinants of Health (SDOH) Interventions    Readmission Risk Interventions    07/04/2021    2:49 PM  Readmission Risk Prevention Plan  Transportation Screening Complete  PCP or Specialist Appt within 5-7 Days Complete  Home Care Screening Complete

## 2021-08-22 NOTE — NC FL2 (Signed)
[place] LEVEL OF CARE SCREENING TOOL     IDENTIFICATION  Patient Name: Charles [name] Birthdate: Oct 07, 1960 Sex: male Admission Date (Current Location): 08/19/2021  [facility] and [place] Number:  Herbalist and Address:  [facility], [place] [address] [place], [place] [address]      Provider Number: [id number]  Attending Physician Name and Address:  Mercy Riding, MD  Relative Name and Phone Number:       Current Level of Care: Hospital Recommended Level of Care: [place] Prior Approval Number:    Date Approved/Denied:   PASRR Number: [id number]  Discharge Plan: SNF    Current Diagnoses: Patient Active Problem List   Diagnosis Date Noted   Unintentional weight loss of more than 10% body weight within 6 months 08/21/2021   Hemorrhoids 08/21/2021   Diverticulosis of colon 08/21/2021   Solitary rectal ulcer 08/21/2021   Polyp of ascending colon 08/21/2021   Hypokalemia 08/21/2021   Acute blood loss anemia 08/20/2021   Hypotension 08/20/2021   Protein-calorie malnutrition, severe ([place]) 08/20/2021   Hypomagnesemia 08/20/2021   Physical deconditioning 08/20/2021   Acute lower GI bleeding 08/19/2021   Pressure injury of skin 07/02/2021   Hypothermia 06/30/2021   Urinary retention 04/23/2021   Acute prerenal azotemia 04/22/2021   HLD (hyperlipidemia) 04/22/2021   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/23/2021   Uncontrolled IDDM-2 with hyperglycemia 03/23/2021   BPH (benign prostatic hyperplasia) 02/08/2021   Phimosis 01/27/2021   Hypertension 12/23/2020   CAD S/p remote CABG 12/23/2020    Orientation RESPIRATION BLADDER Height & Weight     Self, Time, Situation, Place  Normal Continent, Indwelling catheter Weight: 90 lb (40.8 kg) Height:  5\' 6"  (167.6 cm)  BEHAVIORAL SYMPTOMS/MOOD NEUROLOGICAL BOWEL NUTRITION STATUS      Incontinent Diet (See dc summary)  AMBULATORY STATUS COMMUNICATION OF NEEDS Skin   Extensive Assist  Verbally PU Stage and Appropriate Care (Stage II on buttocks w/foam dressing)                       Personal Care Assistance Level of Assistance  Bathing, Feeding, Dressing Bathing Assistance: Maximum assistance Feeding assistance: Independent Dressing Assistance: Limited assistance     Functional Limitations Info  Sight Sight Info: Impaired        SPECIAL CARE FACTORS FREQUENCY  PT (By licensed PT), OT (By licensed OT)     PT Frequency: 5x/week OT Frequency: 5x/week            Contractures Contractures Info: Not present    Additional Factors Info  Code Status, Allergies, Insulin Sliding Scale, Psychotropic Code Status Info: Full Allergies Info: NKA Psychotropic Info: Seroquel Insulin Sliding Scale Info: See dc summary       Current Medications (08/22/2021):  This is the current hospital active medication list Current Facility-Administered Medications  Medication Dose Route Frequency Provider Last Rate Last Admin   acetaminophen (TYLENOL) tablet 650 mg  650 mg Oral Q6H PRN Shela Leff, MD       Or   acetaminophen (TYLENOL) suppository 650 mg  650 mg Rectal Q6H PRN Shela Leff, MD       Chlorhexidine Gluconate Cloth 2 % PADS 6 each  6 each Topical Daily Mercy Riding, MD   6 each at 08/21/21 0759   ezetimibe (ZETIA) tablet 10 mg  10 mg Oral Daily Wendee Beavers T, MD   10 mg at 08/22/21 0920   feeding supplement (BOOST /  RESOURCE BREEZE) liquid 1 Container  1 Container Oral TID BM Mercy Riding, MD   1 Container at 08/22/21 1042   gabapentin (NEURONTIN) capsule 300 mg  300 mg Oral QHS Wendee Beavers T, MD   300 mg at 08/21/21 2052   insulin aspart (novoLOG) injection 0-9 Units  0-9 Units Subcutaneous Q4H Shela Leff, MD   3 Units at 08/22/21 1346   lactated ringers infusion   Intravenous Continuous Daryel November, MD   Stopped at 08/21/21 1949   MEDLINE mouth rinse  15 mL Mouth Rinse BID Gonfa, Taye T, MD   15 mL at 08/22/21 1042   midodrine  (PROAMATINE) tablet 10 mg  10 mg Oral TID WC Wendee Beavers T, MD   10 mg at 08/22/21 1345   mirtazapine (REMERON) tablet 7.5 mg  7.5 mg Oral QHS Wendee Beavers T, MD   7.5 mg at 08/21/21 2052   pantoprazole (PROTONIX) EC tablet 40 mg  40 mg Oral Daily Wendee Beavers T, MD   40 mg at 08/22/21 0921   QUEtiapine (SEROQUEL) tablet 25 mg  25 mg Oral QHS Wendee Beavers T, MD   25 mg at 08/21/21 2053   rosuvastatin (CRESTOR) tablet 40 mg  40 mg Oral Daily Wendee Beavers T, MD   40 mg at 08/22/21 0920   tamsulosin (FLOMAX) capsule 0.4 mg  0.4 mg Oral QPC supper Wendee Beavers T, MD   0.4 mg at 08/21/21 1708     Discharge Medications: Please see discharge summary for a list of discharge medications.  Relevant Imaging Results:  Relevant Lab Results:   Additional Information SSN: [id number]  Benard Halsted, LCSW

## 2021-08-22 NOTE — Progress Notes (Signed)
Initial Nutrition Assessment  DOCUMENTATION CODES:   Severe malnutrition in context of chronic illness, Underweight  INTERVENTION:  Encourage PO intake Order snacks TID between meals (10A and 2P) Ensure Enlive po BID, each supplement provides 350 kcal and 20 grams of protein.  MVI with minerals daily Placed consult for Spiritual Care as per pt request Provided "High Calorie, High Protein Nutrition Therapy" handout    NUTRITION DIAGNOSIS:   Severe Malnutrition related to chronic illness (diabetes, CAD, cognitive decline) as evidenced by severe fat depletion, severe muscle depletion, percent weight loss.  GOAL:   Patient will meet greater than or equal to 90% of their needs  MONITOR:   PO intake, Supplement acceptance, Labs, Weight trends, Skin, I & O's  REASON FOR ASSESSMENT:   Consult Assessment of nutrition requirement/status (unintentional weight loss)  ASSESSMENT:   Pt admitted with rectal bleeding, found to have acute lower GIB. PMH significant for diabetes, hypotension, CAD s/p CABG, BPH, HLD, GERD, and cognitive decline/?dementia.  Colonoscopy showed hemorrhoid, polyp and diverticulosis in ascending colon and solitary rectal ulcer felt to be due to constipation.  Spoke with pt at bedside. He states that at home he has been eating less than half of his usual intake within the last 6 months which he attributes to diabetes and blood pressure. He lives alone but has a sister who lives in [place]. He recalls grazing throughout the day and typically eats out and states that he enjoys [employer]. Pt has been on a clear liquid diet and was receiving Boost Breeze which he states is too sweet. He has since been upgraded to a Dysphagia 3 diet and is agreeable to receive Ensure during remainder of admission and would like to mix the chocolate and strawberry together. Pt has been on automatic tray orders. Per conversation with RN, she states that he has preferred to have automatic trays as  he did not want to call to order meals. We discussed addition of snacks between meals which pt would like.   Provided handout in high calorie, high protein options to enhance nutritional adequacy once pt is discharged from hospital.  During assessment, pt mentioned that he feels as though his medical decline has been a way to prepare for passing. Spoke with him about Spiritual Care as a resource during admission which he appreciates the recommendation and is amenable.   Pt states that he has had wt loss within the last year but more significantly within the last 6 months. Per review of wt hx, he is noted to have had a 37.5% wt loss from 01/10-06/09 which is clinically significant for time frame.   Medications: SSI 0-9 units Q4h, semglee 10 units daily, protonix  Labs: anion gap 4, phos 2.3, Mg 1.6, HgbA1c 10.4%, CBG's 154-250 x24 hours  NUTRITION - FOCUSED PHYSICAL EXAM:  Flowsheet Row Most Recent Value  Orbital Region Severe depletion  Upper Arm Region Severe depletion  Thoracic and Lumbar Region Severe depletion  Buccal Region Moderate depletion  Temple Region Moderate depletion  Clavicle Bone Region Severe depletion  Clavicle and Acromion Bone Region Severe depletion  Scapular Bone Region Severe depletion  Dorsal Hand Severe depletion  Patellar Region Severe depletion  Anterior Thigh Region Severe depletion  Posterior Calf Region Severe depletion  Edema (RD Assessment) None  Hair Reviewed  Eyes Reviewed  Mouth Reviewed  Skin Reviewed  Nails Reviewed      Diet Order:   Diet Order  DIET DYS 3 Room service appropriate? Yes; Fluid consistency: Thin  Diet effective now                   EDUCATION NEEDS:   Education needs have been addressed  Skin:  Skin Assessment: Reviewed RN Assessment Skin Integrity Issues:: Stage II Stage II: R buttock  Last BM:  6/11  Height:   Ht Readings from Last 1 Encounters:  08/19/21 5\' 6"  (1.676 m)    Weight:    Wt Readings from Last 1 Encounters:  08/19/21 40.8 kg   BMI:  Body mass index is 14.53 kg/m.  Estimated Nutritional Needs:   Kcal:  1400-1600  Protein:  70-85g  Fluid:  >/=1.4L  Clayborne Dana, RDN, LDN Clinical Nutrition

## 2021-08-22 NOTE — Evaluation (Signed)
Occupational Therapy Evaluation Patient Details Name: Charles [name] MRN: [id number] DOB: [birth year] Today's Date: 08/22/2021   History of Present Illness 61 y.o. male presented to ED 08/19/21 with rectal bleeding x1 week. Hypotensive. S/p EDG with no abnormalities (6/11). S/p colonoscopy showing hemorrhoid, polyp and diverticulosis (6/11). 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  He presents with decreased balance, activity tolerance, and orientation.  Patient fatigued, dizzy and hypotensive.  Currently requires Max A for UB ADLs and Max -Total A for LB ADLs.  He will benefit from continued Acute Care OT services to increase performance in ADLs/IADLs. Recommendation for dc is SNF to optimize potential to reach PLOF.   Recommendations for follow up therapy are one component of a multi-disciplinary discharge planning process, led by the attending physician.  Recommendations may be updated based on patient status, additional functional criteria and insurance authorization.   Follow Up Recommendations  Skilled nursing-short term rehab (<3 hours/day)    Assistance Recommended at Discharge Frequent or constant Supervision/Assistance  Patient can return home with the following A lot of help with walking and/or transfers;A lot of help with bathing/dressing/bathroom;Assistance with cooking/housework;Direct supervision/assist for medications management    Functional Status Assessment     Equipment Recommendations  BSC/3in1;Other (comment) (RW)    Recommendations for Other Services PT consult;Speech consult     Precautions / Restrictions Precautions Precautions: Fall Precaution Comments: Watch BP Restrictions Weight  Bearing Restrictions: No      Mobility Bed Mobility Overal bed mobility: Needs Assistance Bed Mobility: Rolling, Sidelying to Sit, Sit to Supine Rolling: Max assist Sidelying to sit: Mod assist   Sit to supine: Max assist   General bed mobility comments: Max A for faciltiating shoulder and hips over during roll and then elevating trunk. Max A for managing BLEs for return to bed and pt quickly laying back due to fatigue.    Transfers                   General transfer comment: unable due to drop in BP with pt dizzy and asking to return to supine      Balance Overall balance assessment: Needs assistance Sitting-balance support: Bilateral upper extremity supported, Feet supported Sitting balance-Leahy Scale: Poor                                     ADL either performed or assessed with clinical judgement   ADL Overall ADL's : Needs assistance/impaired Eating/Feeding: Set up;Bed level   Grooming: Supervision/safety;Set up;Bed level   Upper Body Bathing: Sitting;Maximal assistance   Lower Body Bathing: Bed level;Maximal assistance   Upper Body Dressing : Sitting;Maximal assistance   Lower Body Dressing: Bed level;Total assistance                 General ADL Comments: Patient's fatigue and hypotension affected his activity tolerance and occupational performance.     Vision         Perception     Praxis      Pertinent Vitals/Pain Pain Assessment Pain Assessment: 0-10 Pain Score: 4  Pain Intervention(s): Monitored during session, Limited activity within patient's tolerance, Repositioned     Hand Dominance     Extremity/Trunk Assessment Upper Extremity Assessment  Upper Extremity Assessment: Generalized weakness   Lower Extremity Assessment Lower Extremity Assessment: Defer to PT evaluation       Communication Communication Communication: No difficulties   Cognition Arousal/Alertness: Awake/alert Behavior During Therapy:  Flat affect (Smiled occasionally) Overall Cognitive Status: No family/caregiver present to determine baseline cognitive functioning Area of Impairment: Orientation, Memory, Following commands, Safety/judgement, Awareness, Problem solving, Attention                 Orientation Level: Disoriented to, Time Current Attention Level: Sustained Memory: Decreased short-term memory Following Commands: Follows one step commands consistently, Follows one step commands with increased time Safety/Judgement: Decreased awareness of safety Awareness: Intellectual Problem Solving: Slow processing, Requires verbal cues General Comments: Not oriented to day of week or date (stating it is May and the first). patient preoccupied with waiting on procedure so that he could eat; "feel neglected. they did not come back."     General Comments  BP supine 117/70; sitting EOB 68/54; after 1.5 min sitting 80/64; 3 min sitting 68/54; supine 129/79.  Patient reported fatigued and dizziness during most of session    Exercises     Shoulder Instructions      Home Living Family/patient expects to be discharged to:: Private residence Living Arrangements: Alone Available Help at Discharge: Neighbor Type of Home: House Home Access: Stairs to enter Secretary/administratorntrance Stairs-Number of Steps: 12   Home Layout: One level     Bathroom Shower/Tub: Producer, television/film/videoWalk-in shower   Bathroom Toilet: Standard Bathroom Accessibility: Yes   Home Equipment: Rollator (4 wheels);Rolling Walker (2 wheels)          Prior Functioning/Environment Prior Level of Function : Independent/Modified Independent               ADLs Comments: Pt reports independence with bathing and dressing, "orders out" for meals.        OT Problem List: Decreased strength;Decreased activity tolerance;Decreased range of motion;Impaired balance (sitting and/or standing);Decreased coordination;Decreased safety awareness;Decreased knowledge of use of DME or  AE;Decreased knowledge of precautions      OT Treatment/Interventions: Self-care/ADL training;Therapeutic exercise;Energy conservation;DME and/or AE instruction;Therapeutic activities;Patient/family education    OT Goals(Current goals can be found in the care plan section) Acute Rehab OT Goals Patient Stated Goal: go home OT Goal Formulation: With patient Time For Goal Achievement: 09/05/21 Potential to Achieve Goals: Fair  OT Frequency: Min 2X/week    Co-evaluation              AM-PAC OT "6 Clicks" Daily Activity     Outcome Measure Help from another person eating meals?: None Help from another person taking care of personal grooming?: A Little Help from another person toileting, which includes using toliet, bedpan, or urinal?: Total Help from another person bathing (including washing, rinsing, drying)?: A Lot Help from another person to put on and taking off regular upper body clothing?: A Lot Help from another person to put on and taking off regular lower body clothing?: Total 6 Click Score: 13   End of Session Nurse Communication: Other (comment) (patient asks when will procedure occur and next meal)  Activity Tolerance: Patient limited by fatigue;Patient limited by pain Patient left: in bed;with call bell/phone within reach;with bed alarm set  OT Visit Diagnosis: Unsteadiness on feet (R26.81);Other abnormalities of gait and mobility (R26.89);Muscle weakness (generalized) (M62.81);History of falling (Z91.81)                Time: [id number] OT Time Calculation (min): 40 min Charges:  OT General  Charges $OT Visit: 1 Visit OT Evaluation $OT Eval Moderate Complexity: 1 Mod OT Treatments $Therapeutic Activity: 23-37 mins  Rozelle Logan, OTS 08/22/2021, 12:19 PM

## 2021-08-22 NOTE — Progress Notes (Signed)
PROGRESS NOTE  Charles [name] FAO:[id number] DOB: 09-Sep-1960   PCP: Pearline Cables, MD  Patient is from: Home.  Lives alone.  Uses rollator at baseline.  DOA: 08/19/2021 LOS: 3  Chief complaints Chief Complaint  Patient presents with   Rectal Bleeding     Brief Narrative / Interim history: 61 year old M with PMH of IDDM-2, CAD/CABG, BPH/urinary retention with chronic foley, hypotension on midodrine, severe malnutrition, hemorrhoids, GERD and mild cognitive impairment and HLD presenting with hematochezia for about a week, and admitted for possible lower GI bleed.  Hgb 10.5 (about baseline).  He also had over 60 pound weight loss in 6 months.  GI consulted.  Next day, Hgb down to 7.7.  Reportedly had a large stool with dark blood.  He was transfused 1 unit with appropriate response.  EGD on 6/11 basically normal.  Colonoscopy showed hemorrhoid, polyp and diverticulosis in ascending colon and solitary rectal ulcer felt to be due to constipation.  H&H has been stable.  Severe orthostatic hypotension with SBP dropping from 120s to 60s just with sitting in bed.  He was very symptomatic.  Therapy recommended SNF.   Subjective: Seen and examined earlier this morning.  No major events overnight of this morning.  He was very dizzy and orthostatic when he got up with therapy.  Otherwise, no complaints.  He seems a little bit confused.  He thinks he has a procedure today.   Objective: Vitals:   08/22/21 0000 08/22/21 0355 08/22/21 0900 08/22/21 1244  BP: 123/80 91/62 129/79 104/74  Pulse: 85 79 70 75  Resp:  18 20 18   Temp: 98 F (36.7 C) 97.9 F (36.6 C) 98.2 F (36.8 C) 98.1 F (36.7 C)  TempSrc: Oral Oral Oral Oral  SpO2: 95%     Weight:      Height:        Examination:  GENERAL: No apparent distress.  Nontoxic. HEENT: MMM.  Vision and hearing grossly intact.  NECK: Supple.  No apparent JVD.  RESP:  No IWOB.  Fair aeration bilaterally. CVS:  RRR. Heart sounds normal.   ABD/GI/GU: BS+. Abd soft, NTND.  Indwelling Foley catheter. MSK/EXT:  Moves extremities.  Significant muscle mass and subcu fat loss. SKIN: no apparent skin lesion or wound NEURO: Awake and alert. Oriented x4 but somewhat confused.  No apparent focal neuro deficit. PSYCH: Calm. Normal affect.   Procedures:  None  Microbiology summarized: None  Assessment and plan Principal Problem:   Acute lower GI bleeding Active Problems:   Uncontrolled IDDM-2 with hyperglycemia   Acute blood loss anemia   CAD S/p remote CABG   BPH (benign prostatic hyperplasia)   HLD (hyperlipidemia)   Hypotension   Protein-calorie malnutrition, severe (HCC)   Hypomagnesemia   Physical deconditioning   Unintentional weight loss of more than 10% body weight within 6 months   Hemorrhoids   Diverticulosis of colon   Solitary rectal ulcer   Polyp of ascending colon   Hypokalemia  Symptomatic ABLA due to acute lower GI bleed: hematochezia for about a week.  Over 60 pounds weight loss in 6 months.  CT abdomen and pelvis without significant finding.  EGD normal.  Colonoscopy with hemorrhoid, a polyp and diverticulosis in the ascending colon and solitary rectal ulcer.  Hgb reached nadir at 7.7.  Transfused 1 unit with appropriate response.  Anemia panel consistent with anemia of chronic disease Recent Labs    07/06/21 0118 07/07/21 0147 08/19/21 1820 08/20/21 0050 08/20/21 10/20/21 08/20/21 10/20/21  08/20/21 1548 08/21/21 0307 08/21/21 1057 08/22/21 0101  HGB 10.5* 10.3* 10.5* 8.9* 8.1* 7.8* 7.7* 8.7* 8.1* 8.4*  -Monitor H&H.  Transfuse for Hgb < 8.0. -Advance to soft diet. -Continue holding aspirin -SCD for VTE prophylaxis  Diverticulosis/solitary rectal ulcer/polyps of ascending colon/hemorrhoids -Bowel regimen to avoid constipation  Uncontrolled IDDM-2 with hyperglycemia: A1c is 10.4% on 07/03/2021.  On Lantus 14 units, sliding scale and metformin at home. Recent Labs  Lab 08/21/21 2037 08/21/21 2356  08/22/21 0357 08/22/21 0908 08/22/21 1247  GLUCAP 250* 210* 129* 154* 205*  -Continue SSI-sensitive  -Add Semglee 10 units daily. -Further adjustment as appropriate -Continue home statin. -Discontinue Zetia.  Decrease Crestor to 10 mg daily  History of CAD/remote CABG: Stable.  No cardiopulmonary symptoms. -Continue home medications except aspirin  Orthostatic hypotension: He takes midodrine 10 mg daily although it was prescribed 3 times daily -Continue midodrine 10 mg 3 times daily. -Elevate head of bed to 45 degree -Discontinue Remeron, Seroquel and Flomax. -Decrease gabapentin to 100 mg at night  BPH/urinary retention with chronic Foley: he was on oxybutynin at some point.  Started Flomax in April.  Seen by urology on 5/3.  Plan was to change Foley every month starting 07/30/2021 and reassess urodynamics and follow-up with Dr. Ronne BinningMcKenzie post study.  It seems like urology was not able to reach patient to arrange for urodynamics. He has no phone.  Per sister, patient lost his phone service.  CT abdomen did not show prostamegaly.  -Continue Foley catheter -Discontinue Flomax given significant orthostatic hypotension. -Reach out to urology once we know the SNF he goes to so they can set up outpatient follow-up.  History of GERD: EGD normal -Continue PPI  Hypokalemia/hypomagnesemia:  -Monitor and replenish as appropriate.  Mild cognitive impairment: Seem to be fairly oriented but confused at times. -Reorientation and delirium precautions  Severe malnutrition/unintentional weight loss: Body mass index is 14.53 kg/m.  Seems to have lost 25 kg in the last 5 months.  Significant muscle mass and subcu fat loss.  Prealbumin 9.2.  TSH normal.  HIV nonreactive.  No explanation on EGD, Colo, CT abdomen and pelvis. Wt Readings from Last 10 Encounters:  08/19/21 40.8 kg  07/13/21 54.2 kg  07/06/21 54.2 kg  06/06/21 56 kg  05/09/21 58.9 kg  05/05/21 58 kg  05/04/21 56.6 kg  04/24/21 58 kg   04/13/21 61.9 kg  04/07/21 66.5 kg  -Dietitian consulted  Physical deconditioning -PT/OT eval-recommended SNF  DVT prophylaxis:  SCDs Start: 08/19/21 2220 in the setting of GI bleed  Code Status: Full code Family Communication: Updated patient's sister, Maxine GlennMonica over the phone.  She lives in [place]. Level of care: Telemetry Medical Status is: Inpatient Remains inpatient appropriate because: Due to orthostatic hypotension.   Final disposition: SNF Consultants:  Gastroenterology  Sch Meds:  Scheduled Meds:  Chlorhexidine Gluconate Cloth  6 each Topical Daily   ezetimibe  10 mg Oral Daily   feeding supplement  1 Container Oral TID BM   gabapentin  300 mg Oral QHS   insulin aspart  0-9 Units Subcutaneous Q4H   mouth rinse  15 mL Mouth Rinse BID   midodrine  10 mg Oral TID WC   mirtazapine  7.5 mg Oral QHS   pantoprazole  40 mg Oral Daily   QUEtiapine  25 mg Oral QHS   rosuvastatin  40 mg Oral Daily   tamsulosin  0.4 mg Oral QPC supper   Continuous Infusions:  lactated ringers Stopped (08/21/21 1949)  PRN Meds:.acetaminophen **OR** acetaminophen  Antimicrobials: Anti-infectives (From admission, onward)    None        I have personally reviewed the following labs and images: CBC: Recent Labs  Lab 08/19/21 1820 08/20/21 0050 08/20/21 0855 08/20/21 1548 08/21/21 0307 08/21/21 1057 08/22/21 0101  WBC 10.3  --  9.9 11.0* 7.1  --  6.3  NEUTROABS 8.2*  --   --   --   --   --   --   HGB 10.5*   < > 7.8* 7.7* 8.7* 8.1* 8.4*  HCT 32.0*   < > 23.6* 23.9* 25.9* 24.0* 24.3*  MCV 89.1  --  88.1 87.5 84.1  --  83.5  PLT 340  --  293 333 260  --  259   < > = values in this interval not displayed.   BMP &GFR Recent Labs  Lab 08/19/21 1820 08/20/21 0600 08/20/21 0646 08/21/21 0307 08/22/21 0101  NA 135  --  137 138 139  K 4.1  --  3.6 3.2* 3.9  CL 101  --  105 108 112*  CO2 23  --  23 20* 23  GLUCOSE 256*  --  170* 144* 193*  BUN 13  --  CREATININE  0.75  --  0.72 0.74 0.66  CALCIUM 8.7*  --  8.1* 7.8* 8.3*  MG  --  1.2*  --  1.8 1.6*  PHOS  --   --  3.0 2.9 2.3*   Estimated Creatinine Clearance: 58.1 mL/min (by C-G formula based on SCr of 0.66 mg/dL). Liver & Pancreas: Recent Labs  Lab 08/19/21 1820 08/20/21 0646 08/21/21 0307 08/22/21 0101  AST 21  --   --   --   ALT 13  --   --   --   ALKPHOS 102  --   --   --   BILITOT 0.4  --   --   --   PROT 6.5  --   --   --   ALBUMIN 2.5* 2.1* 1.9* 2.0*   No results for input(s): "LIPASE", "AMYLASE" in the last 168 hours. No results for input(s): "AMMONIA" in the last 168 hours. Diabetic: No results for input(s): "HGBA1C" in the last 72 hours. Recent Labs  Lab 08/21/21 2037 08/21/21 2356 08/22/21 0357 08/22/21 0908 08/22/21 1247  GLUCAP 250* 210* 129* 154* 205*   Cardiac Enzymes: No results for input(s): "CKTOTAL", "CKMB", "CKMBINDEX", "TROPONINI" in the last 168 hours. No results for input(s): "PROBNP" in the last 8760 hours. Coagulation Profile: No results for input(s): "INR", "PROTIME" in the last 168 hours. Thyroid Function Tests: Recent Labs    08/22/21 0101  TSH 3.784   Lipid Profile: No results for input(s): "CHOL", "HDL", "LDLCALC", "TRIG", "CHOLHDL", "LDLDIRECT" in the last 72 hours. Anemia Panel: Recent Labs    08/20/21 0600 08/20/21 0646 08/20/21 1548  VITAMINB12 350  --   --   FOLATE  --  6.4  --   FERRITIN 290  --   --   TIBC 209*  --   --   IRON 26*  --   --   RETICCTPCT  --   --  1.7   Urine analysis:    Component Value Date/Time   COLORURINE YELLOW 06/30/2021 2023   APPEARANCEUR CLOUDY (A) 06/30/2021 2023   APPEARANCEUR Hazy (A) 05/09/2021 1006   LABSPEC 1.010 06/30/2021 2023   PHURINE 5.0 06/30/2021 2023   GLUCOSEU >=500 (A) 06/30/2021 2023   HGBUR  MODERATE (A) 06/30/2021 2023   BILIRUBINUR NEGATIVE 06/30/2021 2023   BILIRUBINUR Negative 05/09/2021 1006   KETONESUR 80 (A) 06/30/2021 2023   PROTEINUR NEGATIVE 06/30/2021 2023    NITRITE POSITIVE (A) 06/30/2021 2023   LEUKOCYTESUR LARGE (A) 06/30/2021 2023   Sepsis Labs: Invalid input(s): "PROCALCITONIN", "LACTICIDVEN"  Microbiology: No results found for this or any previous visit (from the past 240 hour(s)).  Radiology Studies: No results found.    Crystallynn Noorani T. Lianna Sitzmann [name]  If 7PM-7AM, please contact night-coverage [email or web address] 08/22/2021, 3:42 PM

## 2021-08-22 NOTE — Inpatient Diabetes Management (Signed)
Inpatient Diabetes Program Recommendations  AACE/ADA: New Consensus Statement on Inpatient Glycemic Control (2015)  Target Ranges:  Prepandial:   less than 140 mg/dL      Peak postprandial:   less than 180 mg/dL (1-2 hours)      Critically ill patients:  140 - 180 mg/dL   Lab Results  Component Value Date   GLUCAP 205 (H) 08/22/2021   HGBA1C 10.4 (H) 07/03/2021    Review of Glycemic Control  Diabetes history: DM 2 Outpatient Diabetes medications: Humalog 2-10 units tid, Basaglar 14 units Daily, metformin 1000 mg bid Current orders for Inpatient glycemic control:  Novolog 0-9 units Q4 hours  A1c 10.4% on 4/23 Endocrinologist: Dr. Kern Alberta with pt at bedside regarding A1c level and glucose control at home. Pt reports seeing Dr. Talmage Nap every 3-6 months. No adjustments have been made recently to home regimen. Pt does report checking his glucose regularly and seeing higher trends at home. Pt does tell me that he will not be going home because he cannot take care of himself and he lives by himself. He also mentions that he does not know if he will ever get back home again.   Thanks,  Christena Deem RN, MSN, BC-ADM Inpatient Diabetes Coordinator Team Pager [phone] (8a-5p)

## 2021-08-23 DIAGNOSIS — E119 Type 2 diabetes mellitus without complications: Secondary | ICD-10-CM | POA: Diagnosis not present

## 2021-08-23 DIAGNOSIS — K922 Gastrointestinal hemorrhage, unspecified: Secondary | ICD-10-CM | POA: Diagnosis not present

## 2021-08-23 DIAGNOSIS — D62 Acute posthemorrhagic anemia: Secondary | ICD-10-CM | POA: Diagnosis not present

## 2021-08-23 DIAGNOSIS — R410 Disorientation, unspecified: Secondary | ICD-10-CM

## 2021-08-23 DIAGNOSIS — N401 Enlarged prostate with lower urinary tract symptoms: Secondary | ICD-10-CM | POA: Diagnosis not present

## 2021-08-23 LAB — CBC
HCT: 24.4 % — ABNORMAL LOW (ref 39.0–52.0)
Hemoglobin: 8.3 g/dL — ABNORMAL LOW (ref 13.0–17.0)
MCH: 28.3 pg (ref 26.0–34.0)
MCHC: 34 g/dL (ref 30.0–36.0)
MCV: 83.3 fL (ref 80.0–100.0)
Platelets: 287 10*3/uL (ref 150–400)
RBC: 2.93 MIL/uL — ABNORMAL LOW (ref 4.22–5.81)
RDW: 16 % — ABNORMAL HIGH (ref 11.5–15.5)
WBC: 8.5 10*3/uL (ref 4.0–10.5)
nRBC: 0 % (ref 0.0–0.2)

## 2021-08-23 LAB — RENAL FUNCTION PANEL
Albumin: 2.1 g/dL — ABNORMAL LOW (ref 3.5–5.0)
Anion gap: 6 (ref 5–15)
BUN: 5 mg/dL — ABNORMAL LOW (ref 6–20)
CO2: 25 mmol/L (ref 22–32)
Calcium: 8.5 mg/dL — ABNORMAL LOW (ref 8.9–10.3)
Chloride: 106 mmol/L (ref 98–111)
Creatinine, Ser: 0.64 mg/dL (ref 0.61–1.24)
GFR, Estimated: >60 mL/min (ref >60)
Glucose, Bld: 90 mg/dL (ref 70–99)
Phosphorus: 3.2 mg/dL (ref 2.5–4.6)
Potassium: 3.7 mmol/L (ref 3.5–5.1)
Sodium: 137 mmol/L (ref 135–145)

## 2021-08-23 LAB — GLUCOSE, CAPILLARY
Glucose-Capillary: 125 mg/dL — ABNORMAL HIGH (ref 70–99)
Glucose-Capillary: 125 mg/dL — ABNORMAL HIGH (ref 70–99)
Glucose-Capillary: 217 mg/dL — ABNORMAL HIGH (ref 70–99)
Glucose-Capillary: 279 mg/dL — ABNORMAL HIGH (ref 70–99)
Glucose-Capillary: 302 mg/dL — ABNORMAL HIGH (ref 70–99)
Glucose-Capillary: 54 mg/dL — ABNORMAL LOW (ref 70–99)
Glucose-Capillary: 69 mg/dL — ABNORMAL LOW (ref 70–99)
Glucose-Capillary: 81 mg/dL (ref 70–99)
Glucose-Capillary: 89 mg/dL (ref 70–99)
Glucose-Capillary: 90 mg/dL (ref 70–99)

## 2021-08-23 LAB — MAGNESIUM: Magnesium: 1.5 mg/dL — ABNORMAL LOW (ref 1.7–2.4)

## 2021-08-23 LAB — CORTISOL: Cortisol, Plasma: 13.4 ug/dL

## 2021-08-23 MED ORDER — SODIUM CHLORIDE 0.9 % IV SOLN
500.0000 mg | Freq: Three times a day (TID) | INTRAVENOUS | Status: AC
  Administered 2021-08-23 – 2021-08-24 (×2): 500 mg via INTRAVENOUS
  Filled 2021-08-23 (×3): qty 5

## 2021-08-23 MED ORDER — THIAMINE HCL 100 MG PO TABS: 100.0000 mg | ORAL_TABLET | Freq: Every day | ORAL | Status: DC

## 2021-08-23 MED ORDER — MAGNESIUM SULFATE 2 GM/50ML IV SOLN
2.0000 g | Freq: Once | INTRAVENOUS | Status: AC
  Administered 2021-08-23: 2 g via INTRAVENOUS
  Filled 2021-08-23: qty 50

## 2021-08-23 MED ORDER — SODIUM CHLORIDE 0.9 % IV SOLN
250.0000 mg | Freq: Three times a day (TID) | INTRAVENOUS | Status: DC
  Filled 2021-08-23 (×2): qty 2.5

## 2021-08-23 NOTE — Progress Notes (Signed)
Physical Therapy Treatment Patient Details Name: Charles [name] MRN: [id number] DOB: Nov 28, 1960 Today's Date: 08/23/2021   History of Present Illness 61 y.o. male presented to ED 08/19/21 with rectal bleeding x1 week. Hypotensive. S/p EDG with no abnormalities (6/11). S/p colonoscopy showing hemorrhoid, polyp and diverticulosis (6/11). 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    PT Comments    Pt received in supine, agreeable to therapy session, oriented to self/location but not situation, pt frequently talking about leaving although he needed maxA to stand and mod to maxA to maintain seated balance at edge of bed. Pt bed in chair posture with heels floated at end of session, RN notified pt requesting to order dinner but likely to need assist 2/2 cognitive impairment. Pt continues to benefit from PT services to progress toward functional mobility goals.  Orthostatic BPs Supine 88/57 (67)  Sitting 90/64 (71)  Standing 63/53 (58)  Seated after standing 77/60 (67)      Recommendations for follow up therapy are one component of a multi-disciplinary discharge planning process, led by the attending physician.  Recommendations may be updated based on patient status, additional functional criteria and insurance authorization.  Follow Up Recommendations  Skilled nursing-short term rehab (<3 hours/day)     Assistance Recommended at Discharge Frequent or constant Supervision/Assistance  Patient can return home with the following Two people to help with walking and/or transfers;Two people to help with bathing/dressing/bathroom;Assistance with cooking/housework;Direct supervision/assist for medications management;Direct supervision/assist for financial management;Assist for transportation;Help with stairs  or ramp for entrance   Equipment Recommendations  None recommended by PT (pending progress)    Recommendations for Other Services OT consult     Precautions / Restrictions Precautions Precautions: Fall Precaution Comments: Watch BP Restrictions Weight Bearing Restrictions: No     Mobility  Bed Mobility Overal bed mobility: Needs Assistance Bed Mobility: Rolling, Sidelying to Sit, Sit to Supine Rolling: Mod assist Sidelying to sit: Mod assist   Sit to supine: Max assist   General bed mobility comments: ModA for faciltiating shoulder and hips over during roll and then elevating trunk. Max A for managing BLEs for return to bed and pt quickly laying back due to fatigue.    Transfers Overall transfer level: Needs assistance Equipment used: 1 person hand held assist Transfers: Sit to/from Stand Sit to Stand: Max assist           General transfer comment: maxA via face to face transfer with HHA, posterior lean throughout    Ambulation/Gait               General Gait Details: defer due to standing BP severely hypotensive   Stairs             Wheelchair Mobility    Modified Rankin (Stroke Patients Only)       Balance Overall balance assessment: Needs assistance Sitting-balance support: Bilateral upper extremity supported, Feet supported Sitting balance-Leahy Scale: Poor Sitting balance - Comments: requires UE support for sitting, notably delayed righting reactions, slight reaction with pt lifting knees as he leans back, pt unable to maintain feet on floor without min to modA Postural control: Posterior lean Standing balance support: Bilateral upper extremity supported Standing balance-Leahy Scale: Zero Standing balance comment: maxA via face to face to maintain upright, pt using bed rail and HHA with heavy support behind hips/with gait belt  Cognition Arousal/Alertness: Awake/alert Behavior During Therapy:  Flat affect (Smiled occasionally) Overall Cognitive Status: No family/caregiver present to determine baseline cognitive functioning Area of Impairment: Orientation, Memory, Following commands, Safety/judgement, Awareness, Problem solving, Attention                 Orientation Level: Disoriented to, Time Current Attention Level: Sustained Memory: Decreased short-term memory Following Commands: Follows one step commands consistently, Follows one step commands with increased time Safety/Judgement: Decreased awareness of safety Awareness: Intellectual Problem Solving: Slow processing, Requires verbal cues, Difficulty sequencing General Comments: Pt states "I am leaving" then later in session "I am planning to go to [employer] to pick up a pizza" despite needing maxA to stand at bedside. Pt asking for menu at end of session, RN notified he may need assist to order his meal. Pt seemingly unaware of heavy posterior lean in sitting/standing postures.        Exercises Other Exercises Other Exercises: supine/seated UE AROM: chest press x15-20 reps ea posture for improved hemodynamics    General Comments General comments (skin integrity, edema, etc.): BP 90/64 (71) sitting EOB, BP 63/53 (58) standing, BP 77/60 (67) after return to sitting EOB      Pertinent Vitals/Pain Pain Assessment Pain Assessment: No/denies pain Pain Intervention(s): Monitored during session, Repositioned    Home Living                          Prior Function            PT Goals (current goals can now be found in the care plan section) Acute Rehab PT Goals PT Goal Formulation: With patient Time For Goal Achievement: 09/03/21 Progress towards PT goals: Progressing toward goals (limited due to BP)    Frequency    Min 3X/week (may refuse SNF/? any days left)      PT Plan Current plan remains appropriate    Co-evaluation              AM-PAC PT "6 Clicks" Mobility   Outcome Measure   Help needed turning from your back to your side while in a flat bed without using bedrails?: A Little Help needed moving from lying on your back to sitting on the side of a flat bed without using bedrails?: A Lot Help needed moving to and from a bed to a chair (including a wheelchair)?: Total Help needed standing up from a chair using your arms (e.g., wheelchair or bedside chair)?: Total Help needed to walk in hospital room?: Total Help needed climbing 3-5 steps with a railing? : Total 6 Click Score: 9    End of Session Equipment Utilized During Treatment: Gait belt Activity Tolerance: Treatment limited secondary to medical complications (Comment) (severe hypotension in standing) Patient left: in bed;with call bell/phone within reach;with bed alarm set (heels floated) Nurse Communication: Mobility status;Other (comment) (pt would benefit from abd binder and ace wraps for BLE to see if this improves BP/hemodynamics) PT Visit Diagnosis: Muscle weakness (generalized) (M62.81);Difficulty in walking, not elsewhere classified (R26.2);Adult, failure to thrive (R62.7)     Time: [id number] PT Time Calculation (min) (ACUTE ONLY): 20 min  Charges:  $Therapeutic Activity: 8-22 mins                     Pasqualina Colasurdo P., PTA Acute Rehabilitation Services Secure Chat Preferred 9a-5:30pm Office: [place] 08/23/2021, 6:37 PM

## 2021-08-23 NOTE — TOC Progression Note (Signed)
Transition of Care [facility]) - Progression Note    Patient Details  Name: Charles [name] MRN: [id number] Date of Birth: September 30, 1960  Transition of Care [facility]) CM/SW Contact  Mearl Latin, LCSW Phone Number: 08/23/2021, 12:32 PM  Clinical Narrative:    CSW faxed clinicals to [facility] for SNF authorization process to [place].   Expected Discharge Plan: Skilled Nursing Facility Barriers to Discharge: English as a second language teacher, Continued Medical Work up  Expected Discharge Plan and Services Expected Discharge Plan: Skilled Nursing Facility In-house Referral: Clinical Social Work   Post Acute Care Choice: Skilled Nursing Facility Living arrangements for the past 2 months: Single Family Home, Skilled Nursing Facility                                       Social Determinants of Health (SDOH) Interventions    Readmission Risk Interventions    07/04/2021    2:49 PM  Readmission Risk Prevention Plan  Transportation Screening Complete  PCP or Specialist Appt within 5-7 Days Complete  Home Care Screening Complete

## 2021-08-23 NOTE — Progress Notes (Signed)
PROGRESS NOTE  Charles [name] [id number] DOB: 06/23/1960   PCP: Pearline Cables, MD  Patient is from: Home.  Lives alone.  Uses rollator at baseline.  DOA: 08/19/2021 LOS: 4  Chief complaints Chief Complaint  Patient presents with   Rectal Bleeding     Brief Narrative / Interim history: 61 year old M with PMH of IDDM-2, CAD/CABG, BPH/urinary retention with chronic foley, hypotension on midodrine, severe malnutrition, hemorrhoids, GERD and mild cognitive impairment and HLD presenting with hematochezia for about a week, and admitted for possible lower GI bleed.  Hgb 10.5 (about baseline).  He also had over 60 pound weight loss in 6 months.  GI consulted.  Next day, Hgb down to 7.7.  Reportedly had a large stool with dark blood.  He was transfused 1 unit with appropriate response.  EGD on 6/11 basically normal.  Colonoscopy showed hemorrhoid, polyp and diverticulosis in ascending colon and solitary rectal ulcer felt to be due to constipation.  H&H has been stable.  Severe orthostatic hypotension with SBP dropping from 120s to 60s just with sitting in bed.  He was very symptomatic. He is also confused.  Therapy recommended SNF.   Subjective: Seen and examined earlier this morning.  Was hypoglycemic to 54 about 4 AM.  He felt dizzy and lightheaded during orthostatic vital check.  Was unable to stand.  He is awake and alert but seems to be confused.  He thinks he is at the gas station.  He says he would be going for grocery shopping.    Objective: Vitals:   08/23/21 0843 08/23/21 0845 08/23/21 0851 08/23/21 1214  BP: 100/82 (!) 57/44 95/70 (!) 91/59  Pulse:    81  Resp:    18  Temp:    (!) 97.5 F (36.4 C)  TempSrc:    Oral  SpO2:      Weight:      Height:        Examination:  GENERAL: Frail and chronically ill-appearing. HEENT: MMM.  Vision and hearing grossly intact.  NECK: Supple.  No apparent JVD.  RESP:  No IWOB.  Fair aeration bilaterally. CVS:  RRR. Heart sounds  normal.  ABD/GI/GU: BS+. Abd soft, NTND.  Indwelling Foley. MSK/EXT:  Moves extremities.  Significant muscle mass and subcu fat loss. SKIN: no apparent skin lesion or wound NEURO: Awake and alert but confused.  He is oriented to self.  He thinks he is at a gas station and he will be going for grocery shopping.  No apparent focal neuro deficit. PSYCH: Calm.  No distress or agitation.  Procedures:  None  Microbiology summarized: None  Assessment and plan Principal Problem:   Acute lower GI bleeding Active Problems:   Uncontrolled IDDM-2 with hyperglycemia   Acute blood loss anemia   CAD S/p remote CABG   BPH (benign prostatic hyperplasia)   HLD (hyperlipidemia)   Orthostatic hypotension   Protein-calorie malnutrition, severe (HCC)   Hypomagnesemia   Physical deconditioning   Unintentional weight loss of more than 10% body weight within 6 months   Hemorrhoids   Diverticulosis of colon   Solitary rectal ulcer   Polyp of ascending colon   Hypokalemia  Symptomatic ABLA due to acute lower GI bleed: hematochezia for about a week.  Over 60 pounds weight loss in 6 months.  CT abdomen and pelvis without significant finding.  EGD normal.  Colonoscopy with hemorrhoid, a polyp and diverticulosis in the ascending colon and solitary rectal ulcer.  Hgb reached nadir at 7.7.  Transfused 1 unit with appropriate response.  Anemia panel consistent with anemia of chronic disease Recent Labs    07/07/21 0147 08/19/21 1820 08/20/21 0050 08/20/21 0237 08/20/21 0855 08/20/21 1548 08/21/21 0307 08/21/21 1057 08/22/21 0101 08/23/21 0841  HGB 10.3* 10.5* 8.9* 8.1* 7.8* 7.7* 8.7* 8.1* 8.4* 8.3*  -Monitor H&H.  Transfuse for Hgb < 8.0. -Continue soft diet.  May advance to regular in the morning. -Continue holding aspirin -SCD for VTE prophylaxis  Diverticulosis/solitary rectal ulcer/polyps of ascending colon/hemorrhoids -Bowel regimen to avoid constipation  Uncontrolled IDDM-2 with  hyperglycemia and hypoglycemia: A1c is 10.4% on 07/03/2021.  On Lantus 14 units, sliding scale and metformin at home.  Hyperglycemia likely due to basal insulin. Recent Labs  Lab 08/23/21 0443 08/23/21 0511 08/23/21 0652 08/23/21 0824 08/23/21 1217  GLUCAP 69* 81 89 125* 125*  -Continue SSI-sensitive  -Discontinue some Reglan -Continue home statin. -Discontinued Zetia.  Decreased Crestor to 10 mg daily  Confusion/delirium: He is awake and alert but he thinks he is at the gas station and planning to go to grocery shopping.  He can barely stand for orthostatic vitals.  No focal neurodeficit.  Low suspicion for infection.  Could be related to severe malnutrition -Reorientation and delirium precautions -Start high-dose thiamine.  History of CAD/remote CABG: Stable.  No cardiopulmonary symptoms. -Continue home medications except aspirin  Orthostatic hypotension: He takes midodrine 10 mg daily although it was prescribed 3 times daily.  TSH and cortisol within normal.  His recent TTE in 03/2021 without significant finding.  I think this is likely from severe malnutrition and some degree of autonomic dysregulation. -Continue midodrine 10 mg 3 times daily. -Elevate head of bed to 45 degree -Discontinued Remeron, Seroquel and Flomax. -Decrease gabapentin to 100 mg at night -Abdominal binder -He has no muscle mass or subcu tissue to use ted hose  BPH/urinary retention with chronic Foley: he was on oxybutynin at some point.  Started Flomax in April.  Seen by urology on 5/3.  Plan was to change Foley every month starting 07/30/2021 and reassess urodynamics and follow-up with Dr. Ronne BinningMcKenzie post study.  It seems like urology was not able to reach patient to arrange for urodynamics. He has no phone.  Per sister, patient lost his phone service.  CT abdomen did not show prostamegaly.  -Continue Foley catheter -Discontinued Flomax given significant orthostatic hypotension. -Reach out to urology once we know  the SNF he goes to so they can set up outpatient follow-up.  History of GERD: EGD normal -Continue PPI  Hypokalemia/hypomagnesemia:  -Monitor and replenish as appropriate.  Mild cognitive impairment: Seem to be fairly oriented but confused at times. -Reorientation and delirium precautions  Severe malnutrition/unintentional weight loss: Body mass index is 14.53 kg/m.  Seems to have lost 25 kg in the last 5 months.  Significant muscle mass and subcu fat loss.  Prealbumin 9.2.  TSH normal.  HIV nonreactive.  No explanation on EGD, Colo, CT abdomen and pelvis. Wt Readings from Last 10 Encounters:  08/19/21 40.8 kg  07/13/21 54.2 kg  07/06/21 54.2 kg  06/06/21 56 kg  05/09/21 58.9 kg  05/05/21 58 kg  05/04/21 56.6 kg  04/24/21 58 kg  04/13/21 61.9 kg  04/07/21 66.5 kg  -Dietitian consulted  Physical deconditioning -PT/OT eval-recommended SNF  DVT prophylaxis:  SCDs Start: 08/19/21 2220 in the setting of GI bleed  Code Status: Full code Family Communication: Updated patient's sister, Maxine GlennMonica over the phone on 6/12.  She lives in [place]. Level of  care: Telemetry Medical Status is: Inpatient Remains inpatient appropriate because: Due to orthostatic hypotension and lack of safe disposition.   Final disposition: SNF Consultants:  Gastroenterology  Sch Meds:  Scheduled Meds:  Chlorhexidine Gluconate Cloth  6 each Topical Daily   feeding supplement  237 mL Oral BID BM   gabapentin  100 mg Oral QHS   insulin aspart  0-9 Units Subcutaneous Q4H   mouth rinse  15 mL Mouth Rinse BID   midodrine  10 mg Oral TID WC   multivitamin with minerals  1 tablet Oral Daily   pantoprazole  40 mg Oral Daily   rosuvastatin  10 mg Oral Daily   [START ON 08/25/2021] thiamine  100 mg Oral Daily   Continuous Infusions:  lactated ringers Stopped (08/21/21 1949)   magnesium sulfate bolus IVPB     thiamine injection     Followed by   Melene Muller ON 08/24/2021] thiamine injection     PRN  Meds:.acetaminophen **OR** acetaminophen, hydrocortisone-pramoxine  Antimicrobials: Anti-infectives (From admission, onward)    None        I have personally reviewed the following labs and images: CBC: Recent Labs  Lab 08/19/21 1820 08/20/21 0050 08/20/21 0855 08/20/21 1548 08/21/21 0307 08/21/21 1057 08/22/21 0101 08/23/21 0841  WBC 10.3  --  9.9 11.0* 7.1  --  6.3 8.5  NEUTROABS 8.2*  --   --   --   --   --   --   --   HGB 10.5*   < > 7.8* 7.7* 8.7* 8.1* 8.4* 8.3*  HCT 32.0*   < > 23.6* 23.9* 25.9* 24.0* 24.3* 24.4*  MCV 89.1  --  88.1 87.5 84.1  --  83.5 83.3  PLT 340  --  293 333 260  --  259 287   < > = values in this interval not displayed.   BMP &GFR Recent Labs  Lab 08/19/21 1820 08/20/21 0600 08/20/21 0646 08/21/21 0307 08/22/21 0101 08/23/21 0841  NA 135  --  137 138 139 137  K 4.1  --  3.6 3.2* 3.9 3.7  CL 101  --  105 108 112* 106  CO2 23  --  23 20* 23 25  GLUCOSE 256*  --  170* 144* 193* 90  BUN 13  --  10 10 6  5*  CREATININE 0.75  --  0.72 0.74 0.66 0.64  CALCIUM 8.7*  --  8.1* 7.8* 8.3* 8.5*  MG  --  1.2*  --  1.8 1.6* 1.5*  PHOS  --   --  3.0 2.9 2.3* 3.2   Estimated Creatinine Clearance: 58.1 mL/min (by C-G formula based on SCr of 0.64 mg/dL). Liver & Pancreas: Recent Labs  Lab 08/19/21 1820 08/20/21 0646 08/21/21 0307 08/22/21 0101 08/23/21 0841  AST 21  --   --   --   --   ALT 13  --   --   --   --   ALKPHOS 102  --   --   --   --   BILITOT 0.4  --   --   --   --   PROT 6.5  --   --   --   --   ALBUMIN 2.5* 2.1* 1.9* 2.0* 2.1*   No results for input(s): "LIPASE", "AMYLASE" in the last 168 hours. No results for input(s): "AMMONIA" in the last 168 hours. Diabetic: No results for input(s): "HGBA1C" in the last 72 hours. Recent Labs  Lab 08/23/21 0443 08/23/21  3235 08/23/21 0652 08/23/21 0824 08/23/21 1217  GLUCAP 69* 81 89 125* 125*   Cardiac Enzymes: No results for input(s): "CKTOTAL", "CKMB", "CKMBINDEX", "TROPONINI"  in the last 168 hours. No results for input(s): "PROBNP" in the last 8760 hours. Coagulation Profile: No results for input(s): "INR", "PROTIME" in the last 168 hours. Thyroid Function Tests: Recent Labs    08/22/21 0101  TSH 3.784   Lipid Profile: No results for input(s): "CHOL", "HDL", "LDLCALC", "TRIG", "CHOLHDL", "LDLDIRECT" in the last 72 hours. Anemia Panel: No results for input(s): "VITAMINB12", "FOLATE", "FERRITIN", "TIBC", "IRON", "RETICCTPCT" in the last 72 hours.  Urine analysis:    Component Value Date/Time   COLORURINE YELLOW 06/30/2021 2023   APPEARANCEUR CLOUDY (A) 06/30/2021 2023   APPEARANCEUR Hazy (A) 05/09/2021 1006   LABSPEC 1.010 06/30/2021 2023   PHURINE 5.0 06/30/2021 2023   GLUCOSEU >=500 (A) 06/30/2021 2023   HGBUR MODERATE (A) 06/30/2021 2023   BILIRUBINUR NEGATIVE 06/30/2021 2023   BILIRUBINUR Negative 05/09/2021 1006   KETONESUR 80 (A) 06/30/2021 2023   PROTEINUR NEGATIVE 06/30/2021 2023   NITRITE POSITIVE (A) 06/30/2021 2023   LEUKOCYTESUR LARGE (A) 06/30/2021 2023   Sepsis Labs: Invalid input(s): "PROCALCITONIN", "LACTICIDVEN"  Microbiology: No results found for this or any previous visit (from the past 240 hour(s)).  Radiology Studies: MR BRAIN W WO CONTRAST  Result Date: 08/22/2021 CLINICAL DATA:  Initial evaluation for brain/CNS neoplasm, staging. EXAM: MRI HEAD WITHOUT AND WITH CONTRAST TECHNIQUE: Multiplanar, multiecho pulse sequences of the brain and surrounding structures were obtained without and with intravenous contrast. CONTRAST:  4.31mL GADAVIST GADOBUTROL 1 MMOL/ML IV SOLN COMPARISON:  Prior study from 07/03/2021. FINDINGS: Brain: Age-related cerebral volume loss, stable. Small remote lacunar infarct present at the right basal ganglia. Hyperintensity mild T2/FLAIR hyperintensity involving periventricular white matter, stable. No evidence for acute or subacute ischemia. Gray-white matter differentiation maintained. No areas of remote  cortical infarction. No acute intracranial hemorrhage. Few subtle punctate chronic micro hemorrhages noted about the periventricular white matter, likely small vessel/hypertensive in nature. 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 Diffuse ventriculomegaly again seen, somewhat out of proportion to cortical sulcation, similar to previous. No other mass lesion, mass effect, or midline shift. No extra-axial fluid collection. No abnormal enhancement. Pituitary gland suprasellar region normal. Vascular: Major intracranial vascular flow voids are maintained. Skull and upper cervical spine: Craniocervical junction within normal limits. Bone marrow signal intensity normal. No scalp soft tissue abnormality. Sinuses/Orbits: Prior bilateral ocular lens replacement. Scattered mucosal thickening noted about the ethmoidal air cells and maxillary sinuses. Small right mastoid effusion noted. Other: None. IMPRESSION: 1. No acute intracranial abnormality. 2. 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 3. Ventriculomegaly with mild periventricular T2/FLAIR signal abnormality, stable. 4. Small remote right basal ganglia lacunar infarct. Electronically Signed   By: Rise Mu M.D.   On: 08/22/2021 19:26      Dayanira Giovannetti T. Eylin Pontarelli [name]  If 7PM-7AM, please contact night-coverage [email or web address] 08/23/2021, 4:52 PM

## 2021-08-23 NOTE — Progress Notes (Signed)
Alanda Slim, MD ordered ace wrap bilaterally for legs and abdominal binder. Neither items stocked on unit. Unit secretary placed orders for items to be delivered.

## 2021-08-23 NOTE — Final Progress Note (Signed)
Sister Maxine Glenn called for an update. Sister then transferred to pt room for them to speak.

## 2021-08-24 ENCOUNTER — Encounter (HOSPITAL_COMMUNITY): Payer: Self-pay | Admitting: Gastroenterology

## 2021-08-24 ENCOUNTER — Telehealth: Payer: Self-pay | Admitting: Urology

## 2021-08-24 DIAGNOSIS — E119 Type 2 diabetes mellitus without complications: Secondary | ICD-10-CM | POA: Diagnosis not present

## 2021-08-24 DIAGNOSIS — K922 Gastrointestinal hemorrhage, unspecified: Secondary | ICD-10-CM | POA: Diagnosis not present

## 2021-08-24 DIAGNOSIS — N401 Enlarged prostate with lower urinary tract symptoms: Secondary | ICD-10-CM | POA: Diagnosis not present

## 2021-08-24 DIAGNOSIS — D62 Acute posthemorrhagic anemia: Secondary | ICD-10-CM | POA: Diagnosis not present

## 2021-08-24 LAB — CBC
HCT: 25.8 % — ABNORMAL LOW (ref 39.0–52.0)
HCT: 24.7 % — ABNORMAL LOW (ref 39.0–52.0)
Hemoglobin: 8.8 g/dL — ABNORMAL LOW (ref 13.0–17.0)
Hemoglobin: 8.3 g/dL — ABNORMAL LOW (ref 13.0–17.0)
MCH: 28.8 pg (ref 26.0–34.0)
MCH: 28.1 pg (ref 26.0–34.0)
MCHC: 34.1 g/dL (ref 30.0–36.0)
MCHC: 33.6 g/dL (ref 30.0–36.0)
MCV: 84.3 fL (ref 80.0–100.0)
MCV: 83.7 fL (ref 80.0–100.0)
Platelets: 313 10*3/uL (ref 150–400)
Platelets: 271 10*3/uL (ref 150–400)
RBC: 3.06 MIL/uL — ABNORMAL LOW (ref 4.22–5.81)
RBC: 2.95 MIL/uL — ABNORMAL LOW (ref 4.22–5.81)
RDW: 15.7 % — ABNORMAL HIGH (ref 11.5–15.5)
RDW: 15.6 % — ABNORMAL HIGH (ref 11.5–15.5)
WBC: 8.7 10*3/uL (ref 4.0–10.5)
WBC: 9.1 10*3/uL (ref 4.0–10.5)
nRBC: 0 % (ref 0.0–0.2)
nRBC: 0 % (ref 0.0–0.2)

## 2021-08-24 LAB — GLUCOSE, CAPILLARY
Glucose-Capillary: 83 mg/dL (ref 70–99)
Glucose-Capillary: 93 mg/dL (ref 70–99)
Glucose-Capillary: 281 mg/dL — ABNORMAL HIGH (ref 70–99)
Glucose-Capillary: 200 mg/dL — ABNORMAL HIGH (ref 70–99)
Glucose-Capillary: 266 mg/dL — ABNORMAL HIGH (ref 70–99)

## 2021-08-24 LAB — HEMOGLOBIN AND HEMATOCRIT, BLOOD
HCT: 26.1 % — ABNORMAL LOW (ref 39.0–52.0)
HCT: 26.1 % — ABNORMAL LOW (ref 39.0–52.0)
HCT: 26.3 % — ABNORMAL LOW (ref 39.0–52.0)
Hemoglobin: 9 g/dL — ABNORMAL LOW (ref 13.0–17.0)
Hemoglobin: 8.7 g/dL — ABNORMAL LOW (ref 13.0–17.0)
Hemoglobin: 9 g/dL — ABNORMAL LOW (ref 13.0–17.0)

## 2021-08-24 LAB — RENAL FUNCTION PANEL
Albumin: 2.1 g/dL — ABNORMAL LOW (ref 3.5–5.0)
Anion gap: 9 (ref 5–15)
BUN: 5 mg/dL — ABNORMAL LOW (ref 6–20)
CO2: 24 mmol/L (ref 22–32)
Calcium: 8.5 mg/dL — ABNORMAL LOW (ref 8.9–10.3)
Chloride: 102 mmol/L (ref 98–111)
Creatinine, Ser: 0.66 mg/dL (ref 0.61–1.24)
GFR, Estimated: >60 mL/min (ref >60)
Glucose, Bld: 230 mg/dL — ABNORMAL HIGH (ref 70–99)
Phosphorus: 3.3 mg/dL (ref 2.5–4.6)
Potassium: 3.4 mmol/L — ABNORMAL LOW (ref 3.5–5.1)
Sodium: 135 mmol/L (ref 135–145)

## 2021-08-24 LAB — SURGICAL PATHOLOGY

## 2021-08-24 LAB — PROTIME-INR
INR: 1 (ref 0.8–1.2)
Prothrombin Time: 13.1 seconds (ref 11.4–15.2)

## 2021-08-24 LAB — TYPE AND SCREEN
ABO/RH(D): A POS
Antibody Screen: NEGATIVE

## 2021-08-24 LAB — MAGNESIUM: Magnesium: 2 mg/dL (ref 1.7–2.4)

## 2021-08-24 MED ORDER — THIAMINE HCL 100 MG PO TABS
100.0000 mg | ORAL_TABLET | Freq: Every day | ORAL | Status: DC
  Administered 2021-08-26 – 2021-08-31 (×5): 100 mg via ORAL
  Filled 2021-08-24 (×5): qty 1

## 2021-08-24 MED ORDER — DOCUSATE SODIUM 100 MG PO CAPS
100.0000 mg | ORAL_CAPSULE | Freq: Every day | ORAL | Status: DC
Start: 2021-08-24 — End: 2021-09-01
  Administered 2021-08-24 – 2021-08-31 (×6): 100 mg via ORAL
  Filled 2021-08-24 (×6): qty 1

## 2021-08-24 MED ORDER — POTASSIUM CHLORIDE CRYS ER 20 MEQ PO TBCR
40.0000 meq | EXTENDED_RELEASE_TABLET | Freq: Once | ORAL | Status: AC
  Administered 2021-08-24: 40 meq via ORAL
  Filled 2021-08-24: qty 2

## 2021-08-24 MED ORDER — THIAMINE HCL 100 MG PO TABS: 100.0000 mg | ORAL_TABLET | Freq: Every day | ORAL | Status: DC

## 2021-08-24 MED ORDER — THIAMINE HCL 100 MG/ML IJ SOLN
250.0000 mg | Freq: Three times a day (TID) | INTRAVENOUS | Status: AC
  Administered 2021-08-24 – 2021-08-25 (×3): 250 mg via INTRAVENOUS
  Filled 2021-08-24 (×3): qty 2.5

## 2021-08-24 MED ORDER — POLYETHYLENE GLYCOL 3350 17 G PO PACK
17.0000 g | PACK | ORAL | Status: DC
  Administered 2021-08-24 – 2021-08-30 (×3): 17 g via ORAL
  Filled 2021-08-24 (×3): qty 1

## 2021-08-24 NOTE — Telephone Encounter (Signed)
Called  [facility] Nursing home in [place]  [phone], Spoke to Phillips and stated the social worker needed to call [facility] and schedule and appt for Urodynamics for [place].

## 2021-08-24 NOTE — TOC Progression Note (Signed)
Transition of Care [facility]) - Progression Note    Patient Details  Name: Rykin Route MRN: [id number] Date of Birth: May 17, 1960  [facility]) CM/SW Contact  Mearl Latin, LCSW Phone Number: 08/24/2021, 5:02 PM  Clinical Narrative:    CSW left another voicemail for [employer].   Expected Discharge Plan: Skilled Nursing Facility Barriers to Discharge: English as a second language teacher, Continued Medical Work up  Expected Discharge Plan and Services Expected Discharge Plan: Skilled Nursing Facility In-house Referral: Clinical Social Work   Post Acute Care Choice: Skilled Nursing Facility Living arrangements for the past 2 months: Single Family Home, Skilled Nursing Facility                                       Social Determinants of Health (SDOH) Interventions    Readmission Risk Interventions    07/04/2021    2:49 PM  Readmission Risk Prevention Plan  Transportation Screening Complete  PCP or Specialist Appt within 5-7 Days Complete  Home Care Screening Complete

## 2021-08-24 NOTE — Progress Notes (Signed)
Physical Therapy Treatment Patient Details Name: Charles [name] MRN: [id number] DOB: 10-07-1960 Today's Date: 08/24/2021   History of Present Illness 61 y.o. male presented to ED 08/19/21 with rectal bleeding x1 week. Hypotensive. S/p EDG with no abnormalities (6/11). S/p colonoscopy showing hemorrhoid, polyp and diverticulosis (6/11). 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    PT Comments    Pt was seen for progression of mobility on the walker and then directly with PT to get to his recliner.  Pt is having difficulty with controlling standing balance, and tends to let his feet just slide forward in standing.  Will recommend two person help with nursing to get him to the chair and to bed, with care not to let him slide into and fall.  Pt is not aware of what he is doing and tends to lose track of the danger in letting his feet get out from under him.  Follow acutely for goals of PT as POC outlines.   Recommendations for follow up therapy are one component of a multi-disciplinary discharge planning process, led by the attending physician.  Recommendations may be updated based on patient status, additional functional criteria and insurance authorization.  Follow Up Recommendations  Skilled nursing-short term rehab (<3 hours/day)     Assistance Recommended at Discharge Frequent or constant Supervision/Assistance  Patient can return home with the following Two people to help with walking and/or transfers;Two people to help with bathing/dressing/bathroom;Assistance with cooking/housework;Direct supervision/assist for medications management;Direct supervision/assist for financial management;Assist for transportation;Help with stairs or ramp for entrance   Equipment Recommendations  None recommended by  PT    Recommendations for Other Services OT consult     Precautions / Restrictions Precautions Precautions: Fall Precaution Comments: Watch BP Restrictions Weight Bearing Restrictions: No     Mobility  Bed Mobility               General bed mobility comments: up in chair when PT arrived    Transfers Overall transfer level: Needs assistance Equipment used: Rolling walker (2 wheels), 1 person hand held assist Transfers: Sit to/from Stand, Bed to chair/wheelchair/BSC Sit to Stand: Max assist Stand pivot transfers: Max assist Step pivot transfers: Mod assist, +2 physical assistance, +2 safety/equipment            Ambulation/Gait               General Gait Details: unable   Stairs             Wheelchair Mobility    Modified Rankin (Stroke Patients Only)       Balance Overall balance assessment: Needs assistance Sitting-balance support: Bilateral upper extremity supported, Feet supported Sitting balance-Leahy Scale: Poor Sitting balance - Comments: requires UE support for sitting, notably delayed righting reactions, slight reaction with pt lifting knees as he leans back, pt unable to maintain feet on floor without min to modA Postural control: Posterior lean Standing balance support: Bilateral upper extremity supported Standing balance-Leahy Scale: Zero Standing balance comment: direct assist with gait belt, attempting sidesteps but pt giving a poor effort                            Cognition Arousal/Alertness: Awake/alert Behavior During Therapy: Flat affect Overall Cognitive Status: No family/caregiver present to determine baseline cognitive functioning Area of Impairment: Orientation, Memory, Following  commands, Safety/judgement, Awareness, Problem solving, Attention                 Orientation Level: Time Current Attention Level: Selective Memory: Decreased recall of precautions, Decreased short-term memory Following  Commands: Follows one step commands with increased time Safety/Judgement: Decreased awareness of deficits, Decreased awareness of safety Awareness: Intellectual Problem Solving: Slow processing, Requires verbal cues, Requires tactile cues General Comments: pt loses focus on standing and tends to let his feet slide out in front, causing PT to assist to chair from [facility] with direct assist of SPT        Exercises General Exercises - Lower Extremity Ankle Circles/Pumps: AAROM, 5 reps Quad Sets: AAROM, 10 reps Gluteal Sets: AROM, 10 reps Long Arc Quad: AROM, 10 reps Heel Slides: AAROM, AROM, 10 reps    General Comments General comments (skin integrity, edema, etc.): pt was up so PT did not get orthostatics      Pertinent Vitals/Pain Pain Assessment Pain Assessment: Faces Faces Pain Scale: Hurts a little bit Pain Location: sacrum; wound Pain Descriptors / Indicators: Guarding, Grimacing Pain Intervention(s): Limited activity within patient's tolerance, Monitored during session, Premedicated before session, Repositioned    Home Living                          Prior Function            PT Goals (current goals can now be found in the care plan section) Acute Rehab PT Goals Patient Stated Goal: to return home    Frequency    Min 3X/week      PT Plan Current plan remains appropriate    Co-evaluation              AM-PAC PT "6 Clicks" Mobility   Outcome Measure  Help needed turning from your back to your side while in a flat bed without using bedrails?: A Little Help needed moving from lying on your back to sitting on the side of a flat bed without using bedrails?: A Lot Help needed moving to and from a bed to a chair (including a wheelchair)?: A Lot Help needed standing up from a chair using your arms (e.g., wheelchair or bedside chair)?: Total Help needed to walk in hospital room?: Total Help needed climbing 3-5 steps with a railing? : Total 6 Click  Score: 10    End of Session Equipment Utilized During Treatment: Gait belt Activity Tolerance: Treatment limited secondary to medical complications (Comment) (unable to get magnitude of hypotension measured) Patient left: in chair;with call bell/phone within reach;with chair alarm set Nurse Communication: Mobility status;Other (comment) PT Visit Diagnosis: Muscle weakness (generalized) (M62.81);Difficulty in walking, not elsewhere classified (R26.2);Adult, failure to thrive (R62.7)     Time: 1610-9604 PT Time Calculation (min) (ACUTE ONLY): 16 min  Charges:  $Therapeutic Activity: 8-22 mins     Ivar Drape 08/24/2021, 5:45 PM  Samul Dada, PT PhD Acute Rehab Dept. Number: [facility] [id number] and [facility] [phone]

## 2021-08-24 NOTE — TOC Progression Note (Signed)
Transition of Care [facility]) - Progression Note    Patient Details  Name: Charles [name] MRN: [id number] Date of Birth: 12-Jul-1960  Transition of Care [facility]) CM/SW Contact  Mearl Latin, LCSW Phone Number: 08/24/2021, 9:33 AM  Clinical Narrative:    CSW left voicemail for [place] with Rosann Auerbach [phone]).   Expected Discharge Plan: Skilled Nursing Facility Barriers to Discharge: English as a second language teacher, Continued Medical Work up  Expected Discharge Plan and Services Expected Discharge Plan: Skilled Nursing Facility In-house Referral: Clinical Social Work   Post Acute Care Choice: Skilled Nursing Facility Living arrangements for the past 2 months: Single Family Home, Skilled Nursing Facility                                       Social Determinants of Health (SDOH) Interventions    Readmission Risk Interventions    07/04/2021    2:49 PM  Readmission Risk Prevention Plan  Transportation Screening Complete  PCP or Specialist Appt within 5-7 Days Complete  Home Care Screening Complete

## 2021-08-24 NOTE — Progress Notes (Signed)
Getting to patient room to check cbg. It was noted that patient was sitting on a substantial amount of fresh bright red blood on the bed on assessment blood was from his rectum. Dr.Gardner was notified and new order for lab was received. Will continue to monitor.

## 2021-08-24 NOTE — Plan of Care (Signed)
  Problem: Coping: Goal: Ability to adjust to condition or change in health will improve Outcome: Progressing   Problem: Fluid Volume: Goal: Ability to maintain a balanced intake and output will improve Outcome: Progressing   Problem: Health Behavior/Discharge Planning: Goal: Ability to identify and utilize available resources and services will improve Outcome: Progressing Goal: Ability to manage health-related needs will improve Outcome: Progressing   Problem: Health Behavior/Discharge Planning: Goal: Ability to manage health-related needs will improve Outcome: Progressing

## 2021-08-24 NOTE — Progress Notes (Signed)
TRH night coverage note: RN called to report significant amount of BRBPR at 0500.  Have ordered: 1) H/H stat and Q6H (last hemoglobin was 8.8 at 0030) 2) type and screen 3) med list reviewed, pt not on any blood thinners 4) will order INR

## 2021-08-24 NOTE — Progress Notes (Signed)
PROGRESS NOTE  Charles [name] KXF:[id number] DOB: 05-23-1960   PCP: Pearline Cables, MD  Patient is from: Home.  Lives alone.  Uses rollator at baseline.  DOA: 08/19/2021 LOS: 5  Chief complaints Chief Complaint  Patient presents with   Rectal Bleeding     Brief Narrative / Interim history: 61 year old M with PMH of IDDM-2, CAD/CABG, BPH/urinary retention with chronic foley, hypotension on midodrine, severe malnutrition, hemorrhoids, GERD and mild cognitive impairment and HLD presenting with hematochezia for about a week, and admitted for possible lower GI bleed.  Hgb 10.5 (about baseline).  He also had over 60 pound weight loss in 6 months.  GI consulted.  Next day, Hgb down to 7.7.  Reportedly had a large stool with dark blood.  He was transfused 1 unit with appropriate response.  EGD on 6/11 basically normal.  Colonoscopy showed hemorrhoid, polyp and diverticulosis in ascending colon and solitary rectal ulcer felt to be due to constipation.  H&H has been stable.  Severe orthostatic hypotension with SBP dropping from 120s to 60s just with sitting in bed.  He was very symptomatic. He is also confused.  Therapy recommended SNF.   Subjective: Seen and examined earlier this morning.  Bright red blood per rectum reported about 5 AM this morning.  No hemodynamic change.  Patient has no recollection of bleeding.  Per RN, he had dark red blood covering about one fourth of his bed part this afternoon.  H&H remained stable.  Objective: Vitals:   08/23/21 2335 08/24/21 0400 08/24/21 0757 08/24/21 1200  BP: 117/68 (!) 96/58 (!) 86/61 100/68  Pulse:  73 75 85  Resp:   16 16  Temp: 97.7 F (36.5 C) 98 F (36.7 C) 97.9 F (36.6 C) 98 F (36.7 C)  TempSrc: Oral Oral Oral Oral  SpO2: 100% 100% 100% 100%  Weight:      Height:        Examination:  GENERAL: Frail and chronically ill-appearing. HEENT: MMM.  Vision and hearing grossly intact.  NECK: Supple.  No apparent JVD.  RESP:  No  IWOB.  Fair aeration bilaterally. CVS:  RRR. Heart sounds normal.  ABD/GI/GU: BS+. Abd soft, NTND.  Indwelling Foley. MSK/EXT:  Moves extremities.  Significant muscle mass and subcu fat loss. SKIN: no apparent skin lesion or wound NEURO: Awake and alert. Oriented to self and place.  Limited insight.  No apparent focal neuro deficit. PSYCH: Calm. Normal affect.   Procedures:  6/11-EGD and colonoscopy as above.  Microbiology summarized: None  Assessment and plan Principal Problem:   Acute lower GI bleeding Active Problems:   Uncontrolled IDDM-2 with hyperglycemia   Acute blood loss anemia   CAD S/p remote CABG   BPH (benign prostatic hyperplasia)   HLD (hyperlipidemia)   Orthostatic hypotension   Protein-calorie malnutrition, severe (HCC)   Hypomagnesemia   Physical deconditioning   Unintentional weight loss of more than 10% body weight within 6 months   Hemorrhoids   Diverticulosis of colon   Solitary rectal ulcer   Polyp of ascending colon   Hypokalemia   Delirium   Confusion  Symptomatic ABLA due to acute lower GI bleed: hematochezia for about a week.  Over 60 pounds weight loss in 6 months.  CT abdomen and pelvis without significant finding.  EGD normal.  Colonoscopy with hemorrhoid, a polyp and diverticulosis in the ascending colon and solitary rectal ulcer.  Hgb reached nadir at 7.7.  Transfused 1 unit with appropriate response.  Anemia panel consistent  with anemia of chronic disease Recent Labs    08/20/21 0855 08/20/21 1548 08/21/21 0307 08/21/21 1057 08/22/21 0101 08/23/21 0841 08/24/21 0027 08/24/21 0540 08/24/21 0855 08/24/21 1113  HGB 7.8* 7.7* 8.7* 8.1* 8.4* 8.3* 8.8* 9.0* 8.3* 8.7*  -Monitor H&H.  Transfuse for Hgb < 8.0. -Continue soft diet.  -Continue holding aspirin -MiraLAX every other day -SCD for VTE prophylaxis  Diverticulosis/solitary rectal ulcer/polyps of ascending colon/hemorrhoids -MiraLAX every other day -Continue Anusol  cream  Uncontrolled IDDM-2 with hyperglycemia and hypoglycemia: A1c is 10.4% on 07/03/2021.  On Lantus 14 units, sliding scale and metformin at home.  Hyperglycemia likely due to basal insulin. Recent Labs  Lab 08/23/21 2018 08/23/21 2354 08/24/21 0441 08/24/21 0759 08/24/21 1212  GLUCAP 90 217* 83 93 281*  -Continue SSI-sensitive  -Continue home statin. -Discontinued Zetia.  Decreased Crestor to 10 mg daily  Confusion/delirium/cognitive impairment:  No focal neurodeficit.  Low suspicion for infection.  Could be related to severe malnutrition and hospital delirium on top of underlying cognitive impairment. -Reorientation and delirium precautions -Start high-dose thiamine.  History of CAD/remote CABG: Stable.  No cardiopulmonary symptoms. -Continue home medications except aspirin  Orthostatic hypotension: He takes midodrine 10 mg daily although it was prescribed 3 times daily.  TSH and cortisol within normal.  His recent TTE in 03/2021 without significant finding.  I think this is likely from severe malnutrition and some degree of autonomic dysregulation. -Continue midodrine 10 mg 3 times daily. -Elevate head of bed to 45 degree -Discontinued Remeron, Seroquel and Flomax. -Decrease gabapentin to 100 mg at night -Ace wrap for his leg and abdominal binder -Daily orthostatic vitals  BPH/urinary retention with chronic Foley: he was on oxybutynin at some point.  Started Flomax in April.  Seen by urology on 5/3.  Plan was to change Foley every month starting 07/30/2021 and reassess urodynamics and follow-up with Dr. Ronne BinningMcKenzie post study.  It seems like urology was not able to reach patient to arrange for urodynamics. He has no phone.  Per sister, patient lost his phone service.  CT abdomen did not show prostamegaly.  -Continue Foley catheter -Discontinued Flomax given significant orthostatic hypotension. -Reach out to urology once we know the SNF he goes to so they can set up outpatient  follow-up.  History of GERD: EGD normal -Continue PPI  Hypokalemia/hypomagnesemia:  -Monitor and replenish as appropriate.  Severe malnutrition/unintentional weight loss: Body mass index is 14.53 kg/m.  Seems to have lost 25 kg in the last 5 months.  Significant muscle mass and subcu fat loss.  Prealbumin 9.2.  TSH normal.  HIV nonreactive.  No explanation on EGD, Colo, CT abdomen and pelvis. Wt Readings from Last 10 Encounters:  08/19/21 40.8 kg  07/13/21 54.2 kg  07/06/21 54.2 kg  06/06/21 56 kg  05/09/21 58.9 kg  05/05/21 58 kg  05/04/21 56.6 kg  04/24/21 58 kg  04/13/21 61.9 kg  04/07/21 66.5 kg   Nutrition Problem: Severe Malnutrition Etiology: chronic illness (diabetes, CAD, cognitive decline) Signs/Symptoms: severe fat depletion, severe muscle depletion, percent weight loss Percent weight loss: 37.5 % Interventions: MVI, Snacks, Ensure Enlive (each supplement provides 350kcal and 20 grams of protein)  Physical deconditioning -PT/OT-recommended SNF  DVT prophylaxis:  SCDs Start: 08/19/21 2220 in the setting of GI bleed  Code Status: Full code Family Communication: patient's sister, Maxine GlennMonica didn't answer when I called Level of care: Telemetry Medical Status is: Inpatient Remains inpatient appropriate because: Due to orthostatic hypotension and lack of safe disposition.  Final disposition: SNF Consultants:  Gastroenterology  Sch Meds:  Scheduled Meds:  Chlorhexidine Gluconate Cloth  6 each Topical Daily   docusate sodium  100 mg Oral Daily   feeding supplement  237 mL Oral BID BM   gabapentin  100 mg Oral QHS   insulin aspart  0-9 Units Subcutaneous Q4H   mouth rinse  15 mL Mouth Rinse BID   midodrine  10 mg Oral TID WC   multivitamin with minerals  1 tablet Oral Daily   pantoprazole  40 mg Oral Daily   rosuvastatin  10 mg Oral Daily   [START ON 08/26/2021] thiamine  100 mg Oral Daily   Continuous Infusions:  lactated ringers Stopped (08/21/21 1949)    thiamine (B-1) 250 mg in sodium chloride 0.9 % 50 mL IVPB     PRN Meds:.acetaminophen **OR** acetaminophen, hydrocortisone-pramoxine  Antimicrobials: Anti-infectives (From admission, onward)    None        I have personally reviewed the following labs and images: CBC: Recent Labs  Lab 08/19/21 1820 08/20/21 0050 08/21/21 0307 08/21/21 1057 08/22/21 0101 08/23/21 0841 08/24/21 0027 08/24/21 0540 08/24/21 0855 08/24/21 1113  WBC 10.3   < > 7.1  --  6.3 8.5 8.7  --  9.1  --   NEUTROABS 8.2*  --   --   --   --   --   --   --   --   --   HGB 10.5*   < > 8.7*   < > 8.4* 8.3* 8.8* 9.0* 8.3* 8.7*  HCT 32.0*   < > 25.9*   < > 24.3* 24.4* 25.8* 26.1* 24.7* 26.1*  MCV 89.1   < > 84.1  --  83.5 83.3 84.3  --  83.7  --   PLT 340   < > 260  --  259 287 313  --  271  --    < > = values in this interval not displayed.   BMP &GFR Recent Labs  Lab 08/20/21 0600 08/20/21 0646 08/21/21 0307 08/22/21 0101 08/23/21 0841 08/24/21 0027  NA  --  137 138 139 137 135  K  --  3.6 3.2* 3.9 3.7 3.4*  CL  --  105 108 112* 106 102  CO2  --  23 20* 23 25 24   GLUCOSE  --  170* 144* 193* 90 230*  BUN  --  10 10 6  5* 5*  CREATININE  --  0.72 0.74 0.66 0.64 0.66  CALCIUM  --  8.1* 7.8* 8.3* 8.5* 8.5*  MG 1.2*  --  1.8 1.6* 1.5* 2.0  PHOS  --  3.0 2.9 2.3* 3.2 3.3   Estimated Creatinine Clearance: 58.1 mL/min (by C-G formula based on SCr of 0.66 mg/dL). Liver & Pancreas: Recent Labs  Lab 08/19/21 1820 08/20/21 0646 08/21/21 0307 08/22/21 0101 08/23/21 0841 08/24/21 0027  AST 21  --   --   --   --   --   ALT 13  --   --   --   --   --   ALKPHOS 102  --   --   --   --   --   BILITOT 0.4  --   --   --   --   --   PROT 6.5  --   --   --   --   --   ALBUMIN 2.5* 2.1* 1.9* 2.0* 2.1* 2.1*   No results for input(s): "LIPASE", "AMYLASE" in  the last 168 hours. No results for input(s): "AMMONIA" in the last 168 hours. Diabetic: No results for input(s): "HGBA1C" in the last 72 hours. Recent  Labs  Lab 08/23/21 2018 08/23/21 2354 08/24/21 0441 08/24/21 0759 08/24/21 1212  GLUCAP 90 217* 83 93 281*   Cardiac Enzymes: No results for input(s): "CKTOTAL", "CKMB", "CKMBINDEX", "TROPONINI" in the last 168 hours. No results for input(s): "PROBNP" in the last 8760 hours. Coagulation Profile: Recent Labs  Lab 08/24/21 0540  INR 1.0   Thyroid Function Tests: Recent Labs    08/22/21 0101  TSH 3.784   Lipid Profile: No results for input(s): "CHOL", "HDL", "LDLCALC", "TRIG", "CHOLHDL", "LDLDIRECT" in the last 72 hours. Anemia Panel: No results for input(s): "VITAMINB12", "FOLATE", "FERRITIN", "TIBC", "IRON", "RETICCTPCT" in the last 72 hours.  Urine analysis:    Component Value Date/Time   COLORURINE YELLOW 06/30/2021 2023   APPEARANCEUR CLOUDY (A) 06/30/2021 2023   APPEARANCEUR Hazy (A) 05/09/2021 1006   LABSPEC 1.010 06/30/2021 2023   PHURINE 5.0 06/30/2021 2023   GLUCOSEU >=500 (A) 06/30/2021 2023   HGBUR MODERATE (A) 06/30/2021 2023   BILIRUBINUR NEGATIVE 06/30/2021 2023   BILIRUBINUR Negative 05/09/2021 1006   KETONESUR 80 (A) 06/30/2021 2023   PROTEINUR NEGATIVE 06/30/2021 2023   NITRITE POSITIVE (A) 06/30/2021 2023   LEUKOCYTESUR LARGE (A) 06/30/2021 2023   Sepsis Labs: Invalid input(s): "PROCALCITONIN", "LACTICIDVEN"  Microbiology: No results found for this or any previous visit (from the past 240 hour(s)).  Radiology Studies: No results found.    Airiel Oblinger T. Jency Schnieders [name]  If 7PM-7AM, please contact night-coverage [email or web address] 08/24/2021, 2:46 PM

## 2021-08-24 NOTE — Progress Notes (Signed)
   08/24/21 1615  Clinical Encounter Type  Visited With Patient  Visit Type Initial;Spiritual support  Referral From Nurse  Consult/Referral To Chaplain   Chaplain responded to a spiritual consult for prayer. The patient, Charles [name], spoke of difficulty in finding hope and a will to live. He explored what brings him joy and peace as he lives his life. We prayed for clarity in seeing those things and energy to pursue them.   Valerie Roys [facility]  [phone]

## 2021-08-24 NOTE — Progress Notes (Signed)
Nutrition Follow-up  DOCUMENTATION CODES:   Severe malnutrition in context of chronic illness, Underweight  INTERVENTION:   Encourage good PO intake Continue Ensure Enlive po BID, each supplement provides 350 kcal and 20 grams of protein. Continue Multivitamin w/ minerals daily Continue snacks between meals Meal ordering with assist  NUTRITION DIAGNOSIS:   Severe Malnutrition related to chronic illness (diabetes, CAD, cognitive decline) as evidenced by severe fat depletion, severe muscle depletion, percent weight loss. - Ongoing   GOAL:   Patient will meet greater than or equal to 90% of their needs  - Progressing   MONITOR:   PO intake, Supplement acceptance, Labs, Weight trends, Skin  REASON FOR ASSESSMENT:   Consult Assessment of nutrition requirement/status  ASSESSMENT:   Pt admitted with rectal bleeding, found to have acute lower GIB. PMH significant for diabetes, hypotension, CAD s/p CABG, BPH, HLD, GERD, and cognitive decline/?dementia.  RD received a consult for assessment of nutrition requirements/status and significant unintentional weight loss.   Pt reports that he is eating better, reports that he is eating >50% of his meals. States that today is the first time he has received Ensure. Pt shares that he is craving a special croissant from [employer].   Encourage pt to continue to eat as much as possible.    RD will continue to follow. Pt with no additional concerns or questions at this time.   Medications reviewed and include: Colace, SSI, MVI, Protonix, IV thiamine Labs reviewed: Potassium 3.4, BUN <5, 24 hr CBG 83-302  Diet Order:   Diet Order             DIET DYS 3 Room service appropriate? Yes; Fluid consistency: Thin  Diet effective now                   EDUCATION NEEDS:   No education needs have been identified at this time  Skin:  Skin Assessment: Reviewed RN Assessment Skin Integrity Issues:: Stage II Stage II: R buttock  Last  BM:  6/13  Height:   Ht Readings from Last 1 Encounters:  08/19/21 5\' 6"  (1.676 m)    Weight:   Wt Readings from Last 1 Encounters:  08/19/21 40.8 kg   BMI:  Body mass index is 14.53 kg/m.  Estimated Nutritional Needs:   Kcal:  1400-1600  Protein:  70-85g  Fluid:  >/=1.4L    Hermina Barters RD, LDN Clinical Dietitian See [facility] for contact information.

## 2021-08-24 NOTE — Progress Notes (Signed)
ACE wrap applied to bilateral Lower extremities, and abdominal binder applied per order

## 2021-08-25 DIAGNOSIS — N401 Enlarged prostate with lower urinary tract symptoms: Secondary | ICD-10-CM | POA: Diagnosis not present

## 2021-08-25 DIAGNOSIS — E119 Type 2 diabetes mellitus without complications: Secondary | ICD-10-CM | POA: Diagnosis not present

## 2021-08-25 DIAGNOSIS — K922 Gastrointestinal hemorrhage, unspecified: Secondary | ICD-10-CM | POA: Diagnosis not present

## 2021-08-25 DIAGNOSIS — D62 Acute posthemorrhagic anemia: Secondary | ICD-10-CM | POA: Diagnosis not present

## 2021-08-25 LAB — GLUCOSE, CAPILLARY
Glucose-Capillary: 118 mg/dL — ABNORMAL HIGH (ref 70–99)
Glucose-Capillary: 120 mg/dL — ABNORMAL HIGH (ref 70–99)
Glucose-Capillary: 139 mg/dL — ABNORMAL HIGH (ref 70–99)
Glucose-Capillary: 231 mg/dL — ABNORMAL HIGH (ref 70–99)
Glucose-Capillary: 261 mg/dL — ABNORMAL HIGH (ref 70–99)
Glucose-Capillary: 280 mg/dL — ABNORMAL HIGH (ref 70–99)

## 2021-08-25 LAB — RENAL FUNCTION PANEL
Albumin: 2 g/dL — ABNORMAL LOW (ref 3.5–5.0)
Anion gap: 10 (ref 5–15)
BUN: 8 mg/dL (ref 6–20)
CO2: 23 mmol/L (ref 22–32)
Calcium: 8.5 mg/dL — ABNORMAL LOW (ref 8.9–10.3)
Chloride: 104 mmol/L (ref 98–111)
Creatinine, Ser: 0.64 mg/dL (ref 0.61–1.24)
GFR, Estimated: >60 mL/min (ref >60)
Glucose, Bld: 142 mg/dL — ABNORMAL HIGH (ref 70–99)
Phosphorus: 3 mg/dL (ref 2.5–4.6)
Potassium: 4 mmol/L (ref 3.5–5.1)
Sodium: 137 mmol/L (ref 135–145)

## 2021-08-25 LAB — CBC
HCT: 23.7 % — ABNORMAL LOW (ref 39.0–52.0)
Hemoglobin: 8.2 g/dL — ABNORMAL LOW (ref 13.0–17.0)
MCH: 29.2 pg (ref 26.0–34.0)
MCHC: 34.6 g/dL (ref 30.0–36.0)
MCV: 84.3 fL (ref 80.0–100.0)
Platelets: 292 10*3/uL (ref 150–400)
RBC: 2.81 MIL/uL — ABNORMAL LOW (ref 4.22–5.81)
RDW: 15.5 % (ref 11.5–15.5)
WBC: 7.2 10*3/uL (ref 4.0–10.5)
nRBC: 0 % (ref 0.0–0.2)

## 2021-08-25 LAB — HEMOGLOBIN AND HEMATOCRIT, BLOOD
HCT: 23.6 % — ABNORMAL LOW (ref 39.0–52.0)
HCT: 25.4 % — ABNORMAL LOW (ref 39.0–52.0)
Hemoglobin: 8 g/dL — ABNORMAL LOW (ref 13.0–17.0)
Hemoglobin: 8.4 g/dL — ABNORMAL LOW (ref 13.0–17.0)

## 2021-08-25 LAB — MAGNESIUM: Magnesium: 1.6 mg/dL — ABNORMAL LOW (ref 1.7–2.4)

## 2021-08-25 MED ORDER — INSULIN GLARGINE-YFGN 100 UNIT/ML ~~LOC~~ SOLN
5.0000 [IU] | Freq: Every day | SUBCUTANEOUS | Status: DC
  Administered 2021-08-26 – 2021-08-31 (×4): 5 [IU] via SUBCUTANEOUS
  Filled 2021-08-25 (×6): qty 0.05

## 2021-08-25 MED ORDER — MAGNESIUM SULFATE 2 GM/50ML IV SOLN
2.0000 g | Freq: Once | INTRAVENOUS | Status: AC
  Administered 2021-08-25: 2 g via INTRAVENOUS
  Filled 2021-08-25: qty 50

## 2021-08-25 NOTE — TOC Progression Note (Addendum)
Transition of Care [facility]) - Progression Note    Patient Details  Name: Charles [name] MRN: [id number] Date of Birth: [birth year]  Transition of Care [facility]) CM/SW Contact  Mearl Latin, LCSW Phone Number: 08/25/2021, 10:13 AM  Clinical Narrative:    10am-CSW left a voicemail for a different case manger at [facility] as no return call has been received yet.   1pm-CSW received call from Rhina Brackett ([phone]). She requested CSW call [phone]. CSW called and representative was unable to complete request due to CSW not having the SNF MD NPI. CSW requested [employer] with the appropriate information.    Expected Discharge Plan: Skilled Nursing Facility Barriers to Discharge: English as a second language teacher, Continued Medical Work up  Expected Discharge Plan and Services Expected Discharge Plan: Skilled Nursing Facility In-house Referral: Clinical Social Work   Post Acute Care Choice: Skilled Nursing Facility Living arrangements for the past 2 months: Single Family Home, Skilled Nursing Facility                                       Social Determinants of Health (SDOH) Interventions    Readmission Risk Interventions    07/04/2021    2:49 PM  Readmission Risk Prevention Plan  Transportation Screening Complete  PCP or Specialist Appt within 5-7 Days Complete  Home Care Screening Complete

## 2021-08-25 NOTE — Plan of Care (Signed)
  Problem: Coping: Goal: Ability to adjust to condition or change in health will improve Outcome: Progressing   Problem: Fluid Volume: Goal: Ability to maintain a balanced intake and output will improve Outcome: Progressing   Problem: Health Behavior/Discharge Planning: Goal: Ability to identify and utilize available resources and services will improve Outcome: Progressing   Problem: Metabolic: Goal: Ability to maintain appropriate glucose levels will improve Outcome: Progressing   Problem: Nutritional: Goal: Maintenance of adequate nutrition will improve Outcome: Progressing   

## 2021-08-25 NOTE — Progress Notes (Signed)
Occupational Therapy Treatment Patient Details Name: Charles [name] MRN: [id number] DOB: [birth year] Today's Date: 08/25/2021   History of present illness 61 y.o. male presented to ED 08/19/21 with rectal bleeding x1 week. Hypotensive. S/p EDG with no abnormalities (6/11). S/p colonoscopy showing hemorrhoid, polyp and diverticulosis (6/11). 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   OT comments  Pt progressing gradually towards goals with improving cognition noted during ADLs. Session focused on BSC/toileting tasks with RN present. Trialed RW initially with Max A x 2 required with posterior lean and narrow base of support. Transfer back to bed improved with use of Stedy with pt able to assist in pulling self up with Mod A x2 . Pt endorses mild dizziness though BP WFL during session and abdominal binder on. Continue to rec SNF rehab.    Recommendations for follow up therapy are one component of a multi-disciplinary discharge planning process, led by the attending physician.  Recommendations may be updated based on patient status, additional functional criteria and insurance authorization.    Follow Up Recommendations  Skilled nursing-short term rehab (<3 hours/day)    Assistance Recommended at Discharge Frequent or constant Supervision/Assistance  Patient can return home with the following  A lot of help with walking and/or transfers;A lot of help with bathing/dressing/bathroom;Assistance with cooking/housework;Direct supervision/assist for medications management   Equipment Recommendations  BSC/3in1;Other (comment);Wheelchair (measurements OT);Wheelchair cushion (measurements OT)    Recommendations for Other Services      Precautions / Restrictions Precautions Precautions: Fall Precaution  Comments: monitor orthostatics, abdominal binder Restrictions Weight Bearing Restrictions: No       Mobility Bed Mobility Overal bed mobility: Needs Assistance Bed Mobility: Supine to Sit, Sit to Supine     Supine to sit: Max assist, HOB elevated Sit to supine: Max assist   General bed mobility comments: partially EOB on entry as RN planning to assist pt to bathroom. able to assist LEs to EOB with truncal support/scooting hips EOB. Pt requires assist for trunk/LEs back into bed    Transfers Overall transfer level: Needs assistance Equipment used: Rolling walker (2 wheels), Ambulation equipment used Transfers: Sit to/from Stand, Bed to chair/wheelchair/BSC Sit to Stand: Max assist, +2 physical assistance, +2 safety/equipment Stand pivot transfers: Max assist, +2 physical assistance, +2 safety/equipment         General transfer comment: see toilet transfer in ADL section Transfer via Lift Equipment: Stedy   Balance Overall balance assessment: Needs assistance Sitting-balance support: Bilateral upper extremity supported, Feet supported Sitting balance-Leahy Scale: Poor   Postural control: Posterior lean Standing balance support: Bilateral upper extremity supported Standing balance-Leahy Scale: Zero                             ADL either performed or assessed with clinical judgement   ADL Overall ADL's : Needs assistance/impaired                         Toilet Transfer: Maximal assistance;+2 for physical assistance;+2 for safety/equipment;BSC/3in1 Toilet Transfer Details (indicate cue type and reason): Initially trialed RW to [facility] with posterior lean and narrow BOS requiring Max A x 2. Improved with use of Stedy back to bed with Mod A x 2 Toileting- Clothing Manipulation and Hygiene: Total assistance;+2 for physical assistance;+2 for safety/equipment;Sit to/from stand Toileting - [employer] fast streamer  Manipulation Details (indicate cue type and reason): +2 for peri  care assist and maintaining balance in Stedy       General ADL Comments: Progressed OOB tolerance and BP WFL though dizziness still experience by pt.    Extremity/Trunk Assessment Upper Extremity Assessment Upper Extremity Assessment: Generalized weakness   Lower Extremity Assessment Lower Extremity Assessment: Defer to PT evaluation        Vision   Vision Assessment?: Vision impaired- to be further tested in functional context   Perception     Praxis      Cognition Arousal/Alertness: Awake/alert Behavior During Therapy: Flat affect Overall Cognitive Status: No family/caregiver present to determine baseline cognitive functioning Area of Impairment: Memory, Following commands, Safety/judgement, Awareness, Problem solving, Attention                   Current Attention Level: Selective Memory: Decreased recall of precautions, Decreased short-term memory Following Commands: Follows one step commands with increased time, Follows one step commands consistently Safety/Judgement: Decreased awareness of deficits, Decreased awareness of safety Awareness: Emergent Problem Solving: Slow processing, Requires verbal cues, Requires tactile cues General Comments: improving insight into deficits, follows directions consistently and eager to participate. multimodal cues for sequencing helpful        Exercises      Shoulder Instructions       General Comments BP 90/70s with initial BSC transfer, 120s/70s after transfer back to bed. Bloody stool noted - RN present and assisting    Pertinent Vitals/ Pain       Pain Assessment Pain Assessment: No/denies pain Pain Intervention(s): Monitored during session  Home Living                                          Prior Functioning/Environment              Frequency  Min 2X/week        Progress Toward Goals  OT Goals(current goals can now be found in the care plan section)  Progress towards OT  goals: Progressing toward goals  Acute Rehab OT Goals Patient Stated Goal: regain strength, be able to perform toileting hygiene without assist OT Goal Formulation: With patient Time For Goal Achievement: 09/05/21 Potential to Achieve Goals: Fair ADL Goals Pt Will Perform Grooming: with set-up;sitting Pt Will Perform Upper Body Dressing: with min guard assist;sitting Pt Will Perform Lower Body Dressing: with mod assist;sit to/from stand Pt Will Transfer to Toilet: with min assist;stand pivot transfer;bedside commode Pt Will Perform Toileting - Clothing Manipulation and hygiene: with min assist;sitting/lateral leans Pt Will Perform Tub/Shower Transfer: with mod assist;ambulating;3 in 1 Additional ADL Goal #1: Pt will maintain sitting balance with Min Guard A for 10 minutes in preparation for ADLs  Plan Discharge plan remains appropriate    Co-evaluation                 AM-PAC OT "6 Clicks" Daily Activity     Outcome Measure   Help from another person eating meals?: None Help from another person taking care of personal grooming?: A Little Help from another person toileting, which includes using toliet, bedpan, or urinal?: Total Help from another person bathing (including washing, rinsing, drying)?: A Lot Help from another person to put on and taking off regular upper body clothing?: A Lot Help from another person to put on and taking off regular lower body clothing?:  Total 6 Click Score: 13    End of Session Equipment Utilized During Treatment: Gait belt;Rolling walker (2 wheels)  OT Visit Diagnosis: Unsteadiness on feet (R26.81);Other abnormalities of gait and mobility (R26.89);Muscle weakness (generalized) (M62.81);History of falling (Z91.81)   Activity Tolerance Patient tolerated treatment well   Patient Left in bed;with call bell/phone within reach;with bed alarm set;with nursing/sitter in room   Nurse Communication Other (comment) (RN present)        Time:  6160-7371 OT Time Calculation (min): 27 min  Charges: OT General Charges $OT Visit: 1 Visit OT Treatments $Self Care/Home Management : 23-37 mins  Bradd Canary, OTR/L Acute Rehab Services Office: [phone]   Lorre Munroe 08/25/2021, 12:05 PM

## 2021-08-25 NOTE — Plan of Care (Signed)

## 2021-08-25 NOTE — Progress Notes (Signed)
PROGRESS NOTE  Charles [name] UYQ:[id number] DOB: [birth year]   PCP: Pearline Cables, MD  Patient is from: Home.  Lives alone.  Uses rollator at baseline.  DOA: 08/19/2021 LOS: 6  Chief complaints Chief Complaint  Patient presents with   Rectal Bleeding     Brief Narrative / Interim history: 61 year old M with PMH of IDDM-2, CAD/CABG, BPH/urinary retention with chronic foley, hypotension on midodrine, severe malnutrition, hemorrhoids, GERD and mild cognitive impairment and HLD presenting with hematochezia for about a week, and admitted for possible lower GI bleed.  Hgb 10.5 (about baseline).  He also had over 60 pound weight loss in 6 months.  GI consulted.  Next day, Hgb down to 7.7.  Reportedly had a large stool with dark blood.  He was transfused 1 unit with appropriate response.  EGD on 6/11 basically normal.  Colonoscopy showed hemorrhoid, polyp and diverticulosis in ascending colon and solitary rectal ulcer felt to be due to constipation.  H&H has been stable.  Severe orthostatic hypotension with SBP dropping from 120s to 60s just with sitting in bed.  He was very symptomatic. He is also confused.  Therapy recommended SNF.   Subjective: Seen and examined earlier this morning.  No major events overnight of this morning.  RN reported bowel movement with very little blood on it.  H&H remained stable.  Still with significant orthostasis despite midodrine, abdominal binder and Ace wrap.  Objective: Vitals:   08/25/21 0400 08/25/21 0736 08/25/21 1201 08/25/21 1543  BP: 102/61 (!) 87/53 (!) 88/65 112/72  Pulse: 79 70 91 75  Resp:  15 18 17   Temp: 97.8 F (36.6 C) 97.6 F (36.4 C) 97.7 F (36.5 C) 97.9 F (36.6 C)  TempSrc: Oral Oral Oral Oral  SpO2: 100% 100% 100% 100%  Weight:      Height:        Examination:  GENERAL: Frail and chronically ill-appearing. HEENT: MMM.  Vision and hearing grossly intact.  NECK: Supple.  No apparent JVD.  RESP:  No IWOB.  Fair aeration  bilaterally. CVS:  RRR. Heart sounds normal.  ABD/GI/GU: BS+. Abd soft, NTND.  Indwelling Foley. MSK/EXT:  Moves extremities.  Significant muscle mass and subcu fat loss. SKIN: no apparent skin lesion or wound NEURO: Awake and alert. Oriented to self and place but not time.  Limited insight.  No apparent focal neuro deficit. PSYCH: Calm. Normal affect.   Procedures:  6/11-EGD and colonoscopy as above.  Microbiology summarized: None  Assessment and plan Principal Problem:   Acute lower GI bleeding Active Problems:   Uncontrolled IDDM-2 with hyperglycemia   Acute blood loss anemia   CAD S/p remote CABG   BPH (benign prostatic hyperplasia)   HLD (hyperlipidemia)   Orthostatic hypotension   Protein-calorie malnutrition, severe (HCC)   Hypomagnesemia   Physical deconditioning   Unintentional weight loss of more than 10% body weight within 6 months   Hemorrhoids   Diverticulosis of colon   Solitary rectal ulcer   Polyp of ascending colon   Hypokalemia   Delirium   Confusion  Symptomatic ABLA due to acute lower GI bleed: hematochezia for about a week.  Over 60 pounds weight loss in 6 months.  CT abdomen and pelvis without significant finding.  EGD normal.  Colonoscopy with hemorrhoid, a polyp and diverticulosis in the ascending colon and solitary rectal ulcer.  Hgb reached nadir at 7.7.  Transfused 1 unit with appropriate response.  Anemia panel consistent with anemia of chronic disease Recent Labs  08/21/21 1057 08/22/21 0101 08/23/21 0841 08/24/21 0027 08/24/21 0540 08/24/21 0855 08/24/21 1113 08/24/21 1914 08/25/21 0334 08/25/21 1202  HGB 8.1* 8.4* 8.3* 8.8* 9.0* 8.3* 8.7* 9.0* 8.2*  8.0* 8.4*  -Monitor H&H.  Transfuse for Hgb < 8.0. -Continue soft diet.  -Continue holding aspirin -MiraLAX every other day. -SCD for VTE prophylaxis  Diverticulosis/solitary rectal ulcer/polyps of ascending colon/hemorrhoids -MiraLAX every other day -Continue Anusol  cream  Uncontrolled IDDM-2 with hyperglycemia and hypoglycemia: A1c is 10.4% on 07/03/2021.  On Lantus 14 units, sliding scale and metformin at home.  Hyperglycemia likely due to basal insulin. Recent Labs  Lab 08/25/21 0022 08/25/21 0410 08/25/21 0739 08/25/21 1203 08/25/21 1542  GLUCAP 120* 139* 118* 261* 280*  -Continue SSI-sensitive  -Semglee 5 units daily starting tomorrow -Continue home statin. -Discontinued Zetia.  Decreased Crestor to 10 mg daily  Confusion/delirium/cognitive impairment:  No focal neurodeficit.  Low suspicion for infection.  Could be related to severe malnutrition and hospital delirium on top of underlying cognitive impairment. -Reorientation and delirium precautions -Start high-dose thiamine.  History of CAD/remote CABG: Stable.  No cardiopulmonary symptoms. -Continue home medications except aspirin  Orthostatic hypotension: He takes midodrine 10 mg daily although it was prescribed 3 times daily.  TSH and cortisol within normal.  His recent TTE in 03/2021 without significant finding.  I think this is likely from severe malnutrition and some degree of autonomic dysregulation. -Continue midodrine 10 mg 3 times daily. -Elevate head of bed to 45 degree -Discontinued Remeron, Seroquel and Flomax. -Decrease gabapentin to 100 mg at night -Ace wrap for his leg and abdominal binder -Daily orthostatic vitals  BPH/urinary retention with chronic Foley: he was on oxybutynin at some point.  Started Flomax in April.  Seen by urology on 5/3.  Plan was to change Foley every month starting 07/30/2021 and reassess urodynamics and follow-up with Dr. Ronne Binning post study.  It seems like urology was not able to reach patient to arrange for urodynamics. He has no phone.  Per sister, patient lost his phone service.  CT abdomen did not show prostamegaly.  -Continue Foley catheter -Discontinued Flomax given significant orthostatic hypotension. -Reach out to urology once we know the SNF  he goes to so they can set up outpatient follow-up.  History of GERD: EGD normal -Continue PPI  Hypokalemia/hypomagnesemia:  -Monitor and replenish as appropriate.  Severe malnutrition/unintentional weight loss: Body mass index is 14.53 kg/m.  Seems to have lost 25 kg in the last 5 months.  Significant muscle mass and subcu fat loss.  Prealbumin 9.2.  TSH normal.  HIV nonreactive.  No explanation on EGD, Colo, CT abdomen and pelvis. Wt Readings from Last 10 Encounters:  08/19/21 40.8 kg  07/13/21 54.2 kg  07/06/21 54.2 kg  06/06/21 56 kg  05/09/21 58.9 kg  05/05/21 58 kg  05/04/21 56.6 kg  04/24/21 58 kg  04/13/21 61.9 kg  04/07/21 66.5 kg   Nutrition Problem: Severe Malnutrition Etiology: chronic illness (diabetes, CAD, cognitive decline) Signs/Symptoms: severe fat depletion, severe muscle depletion, percent weight loss Percent weight loss: 37.5 % Interventions: MVI, Snacks, Ensure Enlive (each supplement provides 350kcal and 20 grams of protein)  Physical deconditioning -PT/OT-recommended SNF  DVT prophylaxis:  SCDs Start: 08/19/21 2220 in the setting of GI bleed  Code Status: Full code Family Communication: patient's sister, Maxine Glenn didn't answer when I called on 6/14 and 6/15 Level of care: Telemetry Medical Status is: Inpatient Remains inpatient appropriate because: Due to orthostatic hypotension and lack of safe disposition.  Final disposition: SNF Consultants:  Gastroenterology  Sch Meds:  Scheduled Meds:  Chlorhexidine Gluconate Cloth  6 each Topical Daily   docusate sodium  100 mg Oral Daily   feeding supplement  237 mL Oral BID BM   gabapentin  100 mg Oral QHS   insulin aspart  0-9 Units Subcutaneous Q4H   mouth rinse  15 mL Mouth Rinse BID   midodrine  10 mg Oral TID WC   multivitamin with minerals  1 tablet Oral Daily   pantoprazole  40 mg Oral Daily   polyethylene glycol  17 g Oral QODAY   rosuvastatin  10 mg Oral Daily   [START ON 08/26/2021]  thiamine  100 mg Oral Daily   Continuous Infusions:  lactated ringers Stopped (08/21/21 1949)   PRN Meds:.acetaminophen **OR** acetaminophen, hydrocortisone-pramoxine  Antimicrobials: Anti-infectives (From admission, onward)    None        I have personally reviewed the following labs and images: CBC: Recent Labs  Lab 08/19/21 1820 08/20/21 0050 08/22/21 0101 08/23/21 0841 08/24/21 0027 08/24/21 0540 08/24/21 0855 08/24/21 1113 08/24/21 1914 08/25/21 0334 08/25/21 1202  WBC 10.3   < > 6.3 8.5 8.7  --  9.1  --   --  7.2  --   NEUTROABS 8.2*  --   --   --   --   --   --   --   --   --   --   HGB 10.5*   < > 8.4* 8.3* 8.8*   < > 8.3* 8.7* 9.0* 8.2*  8.0* 8.4*  HCT 32.0*   < > 24.3* 24.4* 25.8*   < > 24.7* 26.1* 26.3* 23.7*  23.6* 25.4*  MCV 89.1   < > 83.5 83.3 84.3  --  83.7  --   --  84.3  --   PLT 340   < > 259 287 313  --  271  --   --  292  --    < > = values in this interval not displayed.   BMP &GFR Recent Labs  Lab 08/21/21 0307 08/22/21 0101 08/23/21 0841 08/24/21 0027 08/25/21 0334  NA 138 139 137 135 137  K 3.2* 3.9 3.7 3.4* 4.0  CL 108 112* 106 102 104  CO2 20* 23 25 24 23   GLUCOSE 144* 193* 90 230* 142*  BUN 10 6 5* 5* 8  CREATININE 0.74 0.66 0.64 0.66 0.64  CALCIUM 7.8* 8.3* 8.5* 8.5* 8.5*  MG 1.8 1.6* 1.5* 2.0 1.6*  PHOS 2.9 2.3* 3.2 3.3 3.0   Estimated Creatinine Clearance: 58.1 mL/min (by C-G formula based on SCr of 0.64 mg/dL). Liver & Pancreas: Recent Labs  Lab 08/19/21 1820 08/20/21 0646 08/21/21 0307 08/22/21 0101 08/23/21 0841 08/24/21 0027 08/25/21 0334  AST 21  --   --   --   --   --   --   ALT 13  --   --   --   --   --   --   ALKPHOS 102  --   --   --   --   --   --   BILITOT 0.4  --   --   --   --   --   --   PROT 6.5  --   --   --   --   --   --   ALBUMIN 2.5*   < > 1.9* 2.0* 2.1* 2.1* 2.0*   < > = values in  this interval not displayed.   No results for input(s): "LIPASE", "AMYLASE" in the last 168 hours. No  results for input(s): "AMMONIA" in the last 168 hours. Diabetic: No results for input(s): "HGBA1C" in the last 72 hours. Recent Labs  Lab 08/25/21 0022 08/25/21 0410 08/25/21 0739 08/25/21 1203 08/25/21 1542  GLUCAP 120* 139* 118* 261* 280*   Cardiac Enzymes: No results for input(s): "CKTOTAL", "CKMB", "CKMBINDEX", "TROPONINI" in the last 168 hours. No results for input(s): "PROBNP" in the last 8760 hours. Coagulation Profile: Recent Labs  Lab 08/24/21 0540  INR 1.0   Thyroid Function Tests: No results for input(s): "TSH", "T4TOTAL", "FREET4", "T3FREE", "THYROIDAB" in the last 72 hours.  Lipid Profile: No results for input(s): "CHOL", "HDL", "LDLCALC", "TRIG", "CHOLHDL", "LDLDIRECT" in the last 72 hours. Anemia Panel: No results for input(s): "VITAMINB12", "FOLATE", "FERRITIN", "TIBC", "IRON", "RETICCTPCT" in the last 72 hours.  Urine analysis:    Component Value Date/Time   COLORURINE YELLOW 06/30/2021 2023   APPEARANCEUR CLOUDY (A) 06/30/2021 2023   APPEARANCEUR Hazy (A) 05/09/2021 1006   LABSPEC 1.010 06/30/2021 2023   PHURINE 5.0 06/30/2021 2023   GLUCOSEU >=500 (A) 06/30/2021 2023   HGBUR MODERATE (A) 06/30/2021 2023   BILIRUBINUR NEGATIVE 06/30/2021 2023   BILIRUBINUR Negative 05/09/2021 1006   KETONESUR 80 (A) 06/30/2021 2023   PROTEINUR NEGATIVE 06/30/2021 2023   NITRITE POSITIVE (A) 06/30/2021 2023   LEUKOCYTESUR LARGE (A) 06/30/2021 2023   Sepsis Labs: Invalid input(s): "PROCALCITONIN", "LACTICIDVEN"  Microbiology: No results found for this or any previous visit (from the past 240 hour(s)).  Radiology Studies: No results found.  Ezma Rehm T. Makynleigh Breslin [name]  If 7PM-7AM, please contact night-coverage [email or web address] 08/25/2021, 3:46 PM

## 2021-08-26 DIAGNOSIS — K922 Gastrointestinal hemorrhage, unspecified: Secondary | ICD-10-CM | POA: Diagnosis not present

## 2021-08-26 DIAGNOSIS — D62 Acute posthemorrhagic anemia: Secondary | ICD-10-CM | POA: Diagnosis not present

## 2021-08-26 DIAGNOSIS — Z7189 Other specified counseling: Secondary | ICD-10-CM

## 2021-08-26 DIAGNOSIS — Z515 Encounter for palliative care: Secondary | ICD-10-CM | POA: Diagnosis not present

## 2021-08-26 DIAGNOSIS — D72829 Elevated white blood cell count, unspecified: Secondary | ICD-10-CM

## 2021-08-26 DIAGNOSIS — N401 Enlarged prostate with lower urinary tract symptoms: Secondary | ICD-10-CM | POA: Diagnosis not present

## 2021-08-26 DIAGNOSIS — E119 Type 2 diabetes mellitus without complications: Secondary | ICD-10-CM | POA: Diagnosis not present

## 2021-08-26 LAB — URINALYSIS, ROUTINE W REFLEX MICROSCOPIC
Bilirubin Urine: NEGATIVE
Glucose, UA: NEGATIVE mg/dL
Hgb urine dipstick: NEGATIVE
Ketones, ur: NEGATIVE mg/dL
Nitrite: NEGATIVE
Protein, ur: NEGATIVE mg/dL
Specific Gravity, Urine: 1.016 (ref 1.005–1.030)
WBC, UA: >50 WBC/hpf — ABNORMAL HIGH (ref 0–5)
pH: 5 (ref 5.0–8.0)

## 2021-08-26 LAB — CBC
HCT: 26.5 % — ABNORMAL LOW (ref 39.0–52.0)
Hemoglobin: 8.9 g/dL — ABNORMAL LOW (ref 13.0–17.0)
MCH: 28.6 pg (ref 26.0–34.0)
MCHC: 33.6 g/dL (ref 30.0–36.0)
MCV: 85.2 fL (ref 80.0–100.0)
Platelets: 319 10*3/uL (ref 150–400)
RBC: 3.11 MIL/uL — ABNORMAL LOW (ref 4.22–5.81)
RDW: 15.9 % — ABNORMAL HIGH (ref 11.5–15.5)
WBC: 13.7 10*3/uL — ABNORMAL HIGH (ref 4.0–10.5)
nRBC: 0 % (ref 0.0–0.2)

## 2021-08-26 LAB — GLUCOSE, CAPILLARY
Glucose-Capillary: 113 mg/dL — ABNORMAL HIGH (ref 70–99)
Glucose-Capillary: 117 mg/dL — ABNORMAL HIGH (ref 70–99)
Glucose-Capillary: 133 mg/dL — ABNORMAL HIGH (ref 70–99)
Glucose-Capillary: 136 mg/dL — ABNORMAL HIGH (ref 70–99)
Glucose-Capillary: 148 mg/dL — ABNORMAL HIGH (ref 70–99)
Glucose-Capillary: 162 mg/dL — ABNORMAL HIGH (ref 70–99)

## 2021-08-26 LAB — RENAL FUNCTION PANEL
Albumin: 2.3 g/dL — ABNORMAL LOW (ref 3.5–5.0)
Anion gap: 10 (ref 5–15)
BUN: 13 mg/dL (ref 6–20)
CO2: 22 mmol/L (ref 22–32)
Calcium: 8.7 mg/dL — ABNORMAL LOW (ref 8.9–10.3)
Chloride: 101 mmol/L (ref 98–111)
Creatinine, Ser: 0.72 mg/dL (ref 0.61–1.24)
GFR, Estimated: >60 mL/min (ref >60)
Glucose, Bld: 151 mg/dL — ABNORMAL HIGH (ref 70–99)
Phosphorus: 2.2 mg/dL — ABNORMAL LOW (ref 2.5–4.6)
Potassium: 4.5 mmol/L (ref 3.5–5.1)
Sodium: 133 mmol/L — ABNORMAL LOW (ref 135–145)

## 2021-08-26 LAB — AMMONIA: Ammonia: 23 umol/L (ref 9–35)

## 2021-08-26 LAB — MAGNESIUM: Magnesium: 1.7 mg/dL (ref 1.7–2.4)

## 2021-08-26 MED ORDER — MIRTAZAPINE 15 MG PO TBDP
7.5000 mg | ORAL_TABLET | Freq: Every day | ORAL | Status: DC
  Filled 2021-08-26: qty 0.5

## 2021-08-26 MED ORDER — SODIUM CHLORIDE 0.9 % IV BOLUS
500.0000 mL | Freq: Once | INTRAVENOUS | Status: AC
Start: 2021-08-26 — End: 2021-08-26
  Administered 2021-08-26: 500 mL via INTRAVENOUS

## 2021-08-26 MED ORDER — BETHANECHOL CHLORIDE 10 MG PO TABS
10.0000 mg | ORAL_TABLET | Freq: Three times a day (TID) | ORAL | Status: DC
Start: 2021-08-26 — End: 2021-08-26
  Administered 2021-08-26 (×2): 10 mg via ORAL
  Filled 2021-08-26 (×3): qty 1

## 2021-08-26 MED ORDER — MAGNESIUM SULFATE 2 GM/50ML IV SOLN
2.0000 g | Freq: Once | INTRAVENOUS | Status: AC
  Administered 2021-08-26: 2 g via INTRAVENOUS
  Filled 2021-08-26: qty 50

## 2021-08-26 MED ORDER — SODIUM CHLORIDE 0.9 % IV SOLN
1.0000 g | INTRAVENOUS | Status: DC
  Administered 2021-08-26 – 2021-08-30 (×5): 1 g via INTRAVENOUS
  Filled 2021-08-26 (×5): qty 10

## 2021-08-26 MED ORDER — SODIUM CHLORIDE 0.9 % IV SOLN: INTRAVENOUS | Status: DC

## 2021-08-26 NOTE — Progress Notes (Signed)
Charles [name],  The polyp which I removed during your recent procedure was proven to be completely benign but is considered a "pre-cancerous" polyp that MAY have grown into cancer if it had not been removed.  Studies shows that at least 20% of women over age 59 and 30% of men over age 61 have pre-cancerous polyps.  Based on current nationally recognized surveillance guidelines, I recommend that you have a repeat colonoscopy in 7 years.   If you develop any new rectal bleeding, abdominal pain or significant bowel habit changes, please contact me before then.

## 2021-08-26 NOTE — Progress Notes (Signed)
PROGRESS NOTE  Charles [name] CNO:[id number] DOB: 05/08/1960   PCP: Pearline Cables, MD  Patient is from: Home.  Lives alone.  Uses rollator at baseline.  DOA: 08/19/2021 LOS: 7  Chief complaints Chief Complaint  Patient presents with   Rectal Bleeding     Brief Narrative / Interim history: 61 year old M with PMH of IDDM-2, CAD/CABG, BPH/urinary retention with chronic foley, hypotension on midodrine, severe malnutrition, hemorrhoids, GERD and mild cognitive impairment and HLD presenting with hematochezia for about a week, and admitted for possible lower GI bleed.  Hgb 10.5 (about baseline).  He also had over 60 pound weight loss in 6 months.  GI consulted.  Next day, Hgb down to 7.7.  Reportedly had a large stool with dark blood.  He was transfused 1 unit with appropriate response.  EGD on 6/11 basically normal.  Colonoscopy showed hemorrhoid, polyp and diverticulosis in ascending colon and solitary rectal ulcer felt to be due to constipation.  H&H has been stable.  Severe orthostatic hypotension with SBP dropping from 120s to 60s just with sitting in bed.  Now with some degree of encephalopathy, leukocytosis and mild temp.     Subjective: Seen and examined earlier this morning.  He had mild temp to 100 F this morning.  Also rising leukocytosis.  He is more lethargic and confused this morning.  Was not able to eat his breakfast.   Objective: Vitals:   08/25/21 2358 08/26/21 0522 08/26/21 0800 08/26/21 1130  BP: 101/66 (!) 89/55 (!) 91/56 (!) 89/71  Pulse: 89 93 92 94  Resp: 18 19 18 15   Temp: 99.8 F (37.7 C) 99.1 F (37.3 C) 100 F (37.8 C) 99.2 F (37.3 C)  TempSrc: Oral Axillary Oral Oral  SpO2: 100% 99% 100% 99%  Weight:      Height:        Examination:  GENERAL: Frail and chronically ill-appearing.  Somewhat lethargic. HEENT: MMM.  Vision and hearing grossly intact.  NECK: Supple.  No apparent JVD.  RESP:  No IWOB.  Fair aeration bilaterally. CVS:  RRR. Heart  sounds normal.  ABD/GI/GU: BS+. Abd soft, NTND.  Indwelling Foley in place. MSK/EXT: Significant muscle mass and subcu fat loss. SKIN: no apparent skin lesion or wound NEURO: Awake and oriented to self.  Follows command.  Appears lethargic. PSYCH: Appears lethargic.  Procedures:  6/11-EGD and colonoscopy as above.  Microbiology summarized: None  Assessment and plan Principal Problem:   Acute lower GI bleeding Active Problems:   Uncontrolled IDDM-2 with hyperglycemia   Acute blood loss anemia   CAD S/p remote CABG   BPH (benign prostatic hyperplasia)   HLD (hyperlipidemia)   Orthostatic hypotension   Protein-calorie malnutrition, severe (HCC)   Hypomagnesemia   Physical deconditioning   Unintentional weight loss of more than 10% body weight within 6 months   Hemorrhoids   Diverticulosis of colon   Solitary rectal ulcer   Polyp of ascending colon   Hypokalemia   Delirium   Confusion  Symptomatic ABLA due to acute lower GI bleed: hematochezia for about a week.  Over 60 pounds weight loss in 6 months.  CT abdomen and pelvis without significant finding.  EGD normal.  Colonoscopy with hemorrhoid, a polyp and diverticulosis in the ascending colon and solitary rectal ulcer.  Hgb reached nadir at 7.7.  Transfused 1 unit with appropriate response.  Anemia panel consistent with anemia of chronic disease Recent Labs    08/22/21 0101 08/23/21 0841 08/24/21 0027 08/24/21 0540 08/24/21  [id number] 08/24/21 1113 08/24/21 1914 08/25/21 0334 08/25/21 1202 08/26/21 0041  HGB 8.4* 8.3* 8.8* 9.0* 8.3* 8.7* 9.0* 8.2*  8.0* 8.4* 8.9*  -Monitor H&H.  Transfuse for Hgb < 8.0. -Continue soft diet.  -Continue holding aspirin -MiraLAX every other day. -SCD for VTE prophylaxis -GI recommends colonoscopy in 7 years  Diverticulosis/solitary rectal ulcer/polyps of ascending colon/hemorrhoids -MiraLAX every other day -Continue Anusol cream  Uncontrolled IDDM-2 with hyperglycemia and  hypoglycemia: A1c is 10.4% on 07/03/2021.  On Lantus 14 units, sliding scale and metformin at home.  Hyperglycemia likely due to basal insulin. Recent Labs  Lab 08/25/21 2054 08/26/21 0009 08/26/21 0333 08/26/21 0749 08/26/21 1130  GLUCAP 231* 148* 133* 117* 162*  -Continue SSI-sensitive  -Semglee 5 units daily starting tomorrow -Continue home statin. -Discontinued Zetia.  Decreased Crestor to 10 mg daily  Confusion/delirium/cognitive impairment:  No focal neurodeficit.  Low suspicion for infection.  Could be related to severe malnutrition and hospital delirium on top of underlying cognitive impairment. -Infectious work-up as below -Reorientation and delirium precautions -Start high-dose thiamine.  History of CAD/remote CABG: Stable.  No cardiopulmonary symptoms. -Continue home medications except aspirin  Orthostatic hypotension: He takes midodrine 10 mg daily although it was prescribed 3 times daily.  TSH and cortisol within normal.  His recent TTE in 03/2021 without significant finding.  I think this is likely from severe malnutrition and some degree of autonomic dysregulation. -IV NS bolus 500 cc followed by 60 cc an hour -Continue midodrine 10 mg 3 times daily. -Elevate head of bed to 45 degree -Discontinued Remeron, Seroquel and Flomax. -Decrease gabapentin to 100 mg at night -Ace wrap for his leg and abdominal binder -Daily orthostatic vitals  BPH/urinary retention with chronic Foley: he was on oxybutynin at some point.  Started Flomax in April.  Seen by urology on 5/3.  Plan was to change Foley every month starting 07/30/2021 and reassess urodynamics and follow-up with Dr. Alyson Ingles post study.  It seems like urology was not able to reach patient to arrange for urodynamics. He has no phone.  Per sister, patient lost his phone service.  CT abdomen did not show prostamegaly.  -Continue Foley catheter -Discontinued Flomax given significant orthostatic hypotension. -Discussed with  urology, Dr. Gloriann Loan.  Okay to do voiding trial with bethanechol while working up for possible UTI. -Reach out to urology once we know the SNF he goes to so they can set up outpatient follow-up.  Hypokalemia/hypomagnesemia:  -Monitor and replenish as appropriate.  History of GERD: EGD normal -Continue PPI  Leukocytosis: Has mild temp as well.  Primary source could be UTI from his chronic Foley. -Voiding trial -UA from clean-catch or new catheter.  Discussed with RN.  Will add urine culture if UA concerning -Blood culture -Continue monitoring off antibiotics  Severe malnutrition/unintentional weight loss/FTT: Body mass index is 14.53 kg/m.  Seems to have lost 25 kg in the last 5 months.  Significant muscle mass and subcu fat loss.  Prealbumin 9.2.  TSH normal.  HIV nonreactive.  No explanation on EGD, Colo, CT abdomen and pelvis. Wt Readings from Last 10 Encounters:  08/19/21 40.8 kg  07/13/21 54.2 kg  07/06/21 54.2 kg  06/06/21 56 kg  05/09/21 58.9 kg  05/05/21 58 kg  05/04/21 56.6 kg  04/24/21 58 kg  04/13/21 61.9 kg  04/07/21 66.5 kg  Nutrition Problem: Severe Malnutrition Etiology: chronic illness (diabetes, CAD, cognitive decline) Signs/Symptoms: severe fat depletion, severe muscle depletion, percent weight loss Percent weight loss: 37.5 %  Interventions: MVI, Snacks, Ensure Enlive (each supplement provides 350kcal and 20 grams of protein)  Goal of care counseling/physical deconditioning: Patient seems to be declining despite interventions.  He is very frail.  Now with confusion and some degree of metabolic encephalopathy.  Poor p.o. intake.  Very poor prognosis.  I have discussed my concern with patient's sister, [name] over the phone.  She is the closest family member.  She states she will be here on Monday, 08/29/2020.  She prefers to continue full code until she gets here. -Consulted palliative care -PT/OT-recommended SNF  DVT prophylaxis:  SCDs Start: 08/19/21 2220 in the  setting of GI bleed  Code Status: Full code Family Communication: Updated patient's sister over the phone. Level of care: Telemetry Medical Status is: Inpatient Remains inpatient appropriate because: Due to orthostatic hypotension, metabolic encephalopathy and failure to thrive   Final disposition: SNF Consultants:  Gastroenterology Palliative medicine  Sch Meds:  Scheduled Meds:  bethanechol  10 mg Oral TID   Chlorhexidine Gluconate Cloth  6 each Topical Daily   docusate sodium  100 mg Oral Daily   feeding supplement  237 mL Oral BID BM   gabapentin  100 mg Oral QHS   insulin aspart  0-9 Units Subcutaneous Q4H   insulin glargine-yfgn  5 Units Subcutaneous Daily   mouth rinse  15 mL Mouth Rinse BID   midodrine  10 mg Oral TID WC   multivitamin with minerals  1 tablet Oral Daily   pantoprazole  40 mg Oral Daily   polyethylene glycol  17 g Oral QODAY   rosuvastatin  10 mg Oral Daily   thiamine  100 mg Oral Daily   Continuous Infusions:  lactated ringers Stopped (08/21/21 1949)   sodium chloride     PRN Meds:.acetaminophen **OR** acetaminophen, hydrocortisone-pramoxine  Antimicrobials: Anti-infectives (From admission, onward)    None        I have personally reviewed the following labs and images: CBC: Recent Labs  Lab 08/19/21 1820 08/20/21 0050 08/23/21 0841 08/24/21 0027 08/24/21 0540 08/24/21 0855 08/24/21 1113 08/24/21 1914 08/25/21 0334 08/25/21 1202 08/26/21 0041  WBC 10.3   < > 8.5 8.7  --  9.1  --   --  7.2  --  13.7*  NEUTROABS 8.2*  --   --   --   --   --   --   --   --   --   --   HGB 10.5*   < > 8.3* 8.8*   < > 8.3* 8.7* 9.0* 8.2*  8.0* 8.4* 8.9*  HCT 32.0*   < > 24.4* 25.8*   < > 24.7* 26.1* 26.3* 23.7*  23.6* 25.4* 26.5*  MCV 89.1   < > 83.3 84.3  --  83.7  --   --  84.3  --  85.2  PLT 340   < > 287 313  --  271  --   --  292  --  319   < > = values in this interval not displayed.   BMP &GFR Recent Labs  Lab 08/22/21 0101  08/23/21 0841 08/24/21 0027 08/25/21 0334 08/26/21 0041  NA 139 137 135 137 133*  K 3.9 3.7 3.4* 4.0 4.5  CL 112* 106 102 104 101  CO2 23 25 24 23 22   GLUCOSE 193* 90 230* 142* 151*  BUN 6 5* 5* 8 13  CREATININE 0.66 0.64 0.66 0.64 0.72  CALCIUM 8.3* 8.5* 8.5* 8.5* 8.7*  MG 1.6* 1.5* 2.0  1.6* 1.7  PHOS 2.3* 3.2 3.3 3.0 2.2*   Estimated Creatinine Clearance: 58.1 mL/min (by C-G formula based on SCr of 0.72 mg/dL). Liver & Pancreas: Recent Labs  Lab 08/19/21 1820 08/20/21 0646 08/22/21 0101 08/23/21 0841 08/24/21 0027 08/25/21 0334 08/26/21 0041  AST 21  --   --   --   --   --   --   ALT 13  --   --   --   --   --   --   ALKPHOS 102  --   --   --   --   --   --   BILITOT 0.4  --   --   --   --   --   --   PROT 6.5  --   --   --   --   --   --   ALBUMIN 2.5*   < > 2.0* 2.1* 2.1* 2.0* 2.3*   < > = values in this interval not displayed.   No results for input(s): "LIPASE", "AMYLASE" in the last 168 hours. No results for input(s): "AMMONIA" in the last 168 hours. Diabetic: No results for input(s): "HGBA1C" in the last 72 hours. Recent Labs  Lab 08/25/21 2054 08/26/21 0009 08/26/21 0333 08/26/21 0749 08/26/21 1130  GLUCAP 231* 148* 133* 117* 162*   Cardiac Enzymes: No results for input(s): "CKTOTAL", "CKMB", "CKMBINDEX", "TROPONINI" in the last 168 hours. No results for input(s): "PROBNP" in the last 8760 hours. Coagulation Profile: Recent Labs  Lab 08/24/21 0540  INR 1.0   Thyroid Function Tests: No results for input(s): "TSH", "T4TOTAL", "FREET4", "T3FREE", "THYROIDAB" in the last 72 hours.  Lipid Profile: No results for input(s): "CHOL", "HDL", "LDLCALC", "TRIG", "CHOLHDL", "LDLDIRECT" in the last 72 hours. Anemia Panel: No results for input(s): "VITAMINB12", "FOLATE", "FERRITIN", "TIBC", "IRON", "RETICCTPCT" in the last 72 hours.  Urine analysis:    Component Value Date/Time   COLORURINE YELLOW 06/30/2021 2023   APPEARANCEUR CLOUDY (A) 06/30/2021 2023    APPEARANCEUR Hazy (A) 05/09/2021 1006   LABSPEC 1.010 06/30/2021 2023   PHURINE 5.0 06/30/2021 2023   GLUCOSEU >=500 (A) 06/30/2021 2023   HGBUR MODERATE (A) 06/30/2021 2023   BILIRUBINUR NEGATIVE 06/30/2021 2023   BILIRUBINUR Negative 05/09/2021 1006   KETONESUR 80 (A) 06/30/2021 2023   PROTEINUR NEGATIVE 06/30/2021 2023   NITRITE POSITIVE (A) 06/30/2021 2023   LEUKOCYTESUR LARGE (A) 06/30/2021 2023   Sepsis Labs: Invalid input(s): "PROCALCITONIN", "LACTICIDVEN"  Microbiology: No results found for this or any previous visit (from the past 240 hour(s)).  Radiology Studies: No results found.  Naviah Belfield T. Katrice Goel [name]  If 7PM-7AM, please contact night-coverage [email or web address] 08/26/2021, 4:07 PM

## 2021-08-26 NOTE — Consult Note (Incomplete)
Palliative Medicine Inpatient Consult Note  Consulting Provider:  Reason for consult:    08/26/2021  HPI:  Per intake H&P -->    Clinical Assessment/Goals of Care:  *Please note that this is a verbal dictation therefore any spelling or grammatical errors are due to the "[employer]" system interpretation.  I have reviewed medical records including EPIC notes, labs and imaging, received report from bedside RN, assessed the patient.    I met with *** to further discuss diagnosis prognosis, GOC, EOL wishes, disposition and options.   I introduced Palliative Medicine as specialized medical care for people living with serious illness. It focuses on providing relief from the symptoms and stress of a serious illness. The goal is to improve quality of life for both the patient and the family.  Medical History Review and Understanding:    Social History:    Functional and Nutritional State:    Palliative Symptoms:    Advance Directives: A detailed discussion was had today regarding advanced directives.    Code Status: Concepts specific to code status, artifical feeding and hydration, continued IV antibiotics and rehospitalization was had.  The difference between a aggressive medical intervention path  and a palliative comfort care path for this patient at this time was had.   Encouraged patient/family to consider DNR/DNI status understanding evidenced based poor outcomes in similar hospitalized patient, as the cause of arrest is likely associated with advanced chronic/terminal illness rather than an easily reversible acute cardio-pulmonary event. I explained that DNR/DNI does not change the medical plan and it only comes into effect after a person has arrested (died).  It is a protective measure to keep [place] from harming the patient in their last moments of life. *** was agreeable to DNR/DNI with understanding that patient would not receive CPR, defibrillation, ACLS  medications, or intubation.   Discussion:    Discussed the importance of continued conversation with family and their  medical providers regarding overall plan of care and treatment options, ensuring decisions are within the context of the patients values and GOCs.  Provided *** "Hard Choices for Loving People" booklet.   Provided *** "Gone From My Site" booklet.  Decision Maker:  SUMMARY OF RECOMMENDATIONS    Code Status/Advance Care Planning: FULL CODE  DNAR/DNI  Modified CODE   Symptom Management:   Palliative Prophylaxis:  Aspiration, Bowel Regimen, Delirium Protocol, Frequent Pain Assessment, Oral Care, Palliative Wound Care, and Turn Reposition  Additional Recommendations (Limitations, Scope, Preferences): Avoid Hospitalization, Full Scope Treatment, No Artificial Feeding, No Surgical Procedures, and No Tracheostomy  Psycho-social/Spiritual:  Desire for further Chaplaincy support:  Additional Recommendations:    Prognosis:   Discharge Planning:   ROS  Oral Intake %:   I/O:   Bowel Movements:   Mobility:   Vitals:   08/26/21 0800 08/26/21 1130  BP: (!) 91/56 (!) 89/71  Pulse: 92 94  Resp: 18 15  Temp: 100 F (37.8 C) 99.2 F (37.3 C)  SpO2: 100% 99%    Intake/Output Summary (Last 24 hours) at 08/26/2021 1757 Last data filed at 08/26/2021 1115 Gross per 24 hour  Intake 170 ml  Output 775 ml  Net -605 ml   Last Weight  Most recent update: 08/19/2021  6:07 PM    Weight  40.8 kg (90 lb)             Gen:  NAD HEENT: moist mucous membranes CV: Regular rate and rhythm, no murmurs rubs or gallops PULM: clear to auscultation bilaterally.  No wheezes/rales/rhonchi*** ABD: soft/nontender/nondistended/normal bowel sounds*** EXT: No edema*** Neuro: Alert and oriented x3***  PPS:   This conversation/these recommendations were discussed with patient primary care team, Dr. Marland Kitchen  Time In: Time Out: Total Time: ***  Billing based on MDM:  ***  {Problems Addressed:304933}  {Amount and/or Complexity of JQBH:419379}  {Risks:304936} ______________________________________________________ [facility] Team Team Cell Phone: [phone] Please utilize secure chat with additional questions, if there is no response within 30 minutes please call the above phone number  Palliative Medicine Team providers are available by phone from 7am to 7pm daily and can be reached through the team cell phone.  Should this patient require assistance outside of these hours, please call the patient's attending physician.

## 2021-08-26 NOTE — Progress Notes (Signed)
Pt's sister, Danise Mina, called for an update on her brother.  She is listed as his emergency contact.  She is requesting a call from the hospitalist to discuss plans for and get an update on his care.  She confirmed the number to reach her is [phone].  She lives in [place] and is planning to come to [facility] on Monday to see her brother.  She is in the process of finalizing the HCPOA forms. Hilton Sinclair BSN RN CMSRN 08/26/2021, 8:04 PM

## 2021-08-26 NOTE — Progress Notes (Signed)
Physical Therapy Treatment Patient Details Name: Charles [name] MRN: [id number] DOB: [birth year] Today's Date: 08/26/2021   History of Present Illness 61 y.o. male presented to ED 08/19/21 with rectal bleeding x1 week. Hypotensive. S/p EDG with no abnormalities (6/11). S/p colonoscopy showing hemorrhoid, polyp and diverticulosis (6/11). 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    PT Comments    Pt received in supine, eyes open and A&O x0, noted to have bloody BM on bed pad and following <10% of commands during bed mobility. Increased time spent to engage pt with attempts to self-assist with mobility however pt unable to follow commands and continually returning to fetal position, pt orthostatic despite ace wraps on LE and abdominal binder with HOB at 60*. Pt unable to self-feed today, RN notified as his food arrived at end of session. Total A for rolling, hygiene assist and repositioning in bed. Pt with sacral skin breakdown, RN notified that no dressing was in place, sidelying to L and heels floated at end of session. Pt continues to benefit from PT services to progress toward functional mobility goals.   Recommendations for follow up therapy are one component of a multi-disciplinary discharge planning process, led by the attending physician.  Recommendations may be updated based on patient status, additional functional criteria and insurance authorization.  Follow Up Recommendations  Skilled nursing-short term rehab (<3 hours/day)     Assistance Recommended at Discharge Frequent or constant Supervision/Assistance  Patient can return home with the following Two people to help with walking and/or transfers;Two people to help with bathing/dressing/bathroom;Assistance with cooking/housework;Direct  supervision/assist for medications management;Direct supervision/assist for financial management;Assist for transportation;Help with stairs or ramp for entrance   Equipment Recommendations  None recommended by PT (TBD)    Recommendations for Other Services       Precautions / Restrictions Precautions Precautions: Fall Precaution Comments: monitor orthostatics, abdominal binder and ace wraps for LE in room to don prior to transfers Restrictions Weight Bearing Restrictions: No     Mobility  Bed Mobility Overal bed mobility: Needs Assistance Bed Mobility: Rolling Rolling: Total assist         General bed mobility comments: totalA for L/R rolling x4 reps, pt needs multimodal cues and hand over hand assist but still unable to pull himself around, needing bed pad assist; totalA for peri-care in sidelying    Transfers                   General transfer comment: defer, pt orthostatic with HOB at 60* and feet down and not following commands to sit EOB (curls into fetal position)      Balance       Sitting balance - Comments: unsafe to assess                                    Cognition Arousal/Alertness: Lethargic Behavior During Therapy: Flat affect Overall Cognitive Status: No family/caregiver present to determine baseline cognitive functioning Area of Impairment: Memory, Following commands, Safety/judgement, Awareness, Problem solving, Attention, Orientation                 Orientation Level: Disoriented to, Person, Place, Time, Situation Current Attention Level: Focused Memory: Decreased recall of precautions, Decreased short-term memory Following Commands: Follows one step commands inconsistently Safety/Judgement: Decreased awareness of  deficits, Decreased awareness of safety Awareness: Intellectual Problem Solving: Slow processing, Requires verbal cues, Requires tactile cues, Decreased initiation, Difficulty sequencing General Comments:  Pt slow processing, minimally conversant, unable to state DOB, location, time or reason for admission today. Awake and eyes open throughout but following <10% of commands, needs hand over hand assist to initiate and perform all tasks today. Pt unable to lift water cup to his mouth to drink, RN notified he will need feeding assist.        Exercises General Exercises - Lower Extremity Ankle Circles/Pumps: PROM, Both, 10 reps, Supine Short Arc Quad: PROM, Both, 10 reps (bed in chair posture) Heel Slides: PROM, Both, 10 reps, Supine Straight Leg Raises: PROM, Both, 5 reps, Supine Other Exercises Other Exercises: supine UE PROM: chest press x15 reps    General Comments General comments (skin integrity, edema, etc.): HR 94 bpm, SpO2 100% on RA, BP 92/63 supine, then BP 73/54 (62) with bed in full chair posture (60* HOB, legs down) HR 100 (with ace wraps and abd binder donned), BP 87/60 (69) after return to supine      Pertinent Vitals/Pain Pain Assessment Pain Assessment: Faces Faces Pain Scale: Hurts little more Pain Location: sacrum with peri-care Pain Descriptors / Indicators: Guarding, Grimacing Pain Intervention(s): Monitored during session, Repositioned, Limited activity within patient's tolerance     PT Goals (current goals can now be found in the care plan section) Acute Rehab PT Goals Patient Stated Goal: unable to state PT Goal Formulation: With patient Time For Goal Achievement: 09/03/21 Progress towards PT goals: Progressing toward goals    Frequency    Min 3X/week      PT Plan Current plan remains appropriate       AM-PAC PT "6 Clicks" Mobility   Outcome Measure  Help needed turning from your back to your side while in a flat bed without using bedrails?: Total Help needed moving from lying on your back to sitting on the side of a flat bed without using bedrails?: Total Help needed moving to and from a bed to a chair (including a wheelchair)?: Total Help  needed standing up from a chair using your arms (e.g., wheelchair or bedside chair)?: Total Help needed to walk in hospital room?: Total Help needed climbing 3-5 steps with a railing? : Total 6 Click Score: 6    End of Session   Activity Tolerance: Patient limited by lethargy;Treatment limited secondary to medical complications (Comment);Other (comment) (severe orthostatic hypotension with minimal positional change, pt not following commands to progress mobility) Patient left: in bed;with call bell/phone within reach;with bed alarm set;Other (comment);with SCD's reapplied (pt not following commands well today/lethargic, needs sacral foam, blood in stool, orthostatic) Nurse Communication: Mobility status;Need for lift equipment;Precautions;Other (comment) (ace wraps OK to doff when not actively working with therapies, use figure 8 pattern to wrap for pt safety (written on white board)) PT Visit Diagnosis: Muscle weakness (generalized) (M62.81);Difficulty in walking, not elsewhere classified (R26.2);Adult, failure to thrive (R62.7)     Time: 0109-3235 PT Time Calculation (min) (ACUTE ONLY): 40 min  Charges:  $Therapeutic Exercise: 8-22 mins $Therapeutic Activity: 23-37 mins                     Brie Eppard P., PTA Acute Rehabilitation Services Secure Chat Preferred 9a-5:30pm Office: [phone]    Dorathy Kinsman [facility] 08/26/2021, 2:30 PM

## 2021-08-26 NOTE — TOC Progression Note (Addendum)
Transition of Care [facility]) - Progression Note    Patient Details  Name: Olof Marcil MRN: [id number] Date of Birth: [birth year]  Transition of Care [facility]) CM/SW Contact  Mearl Latin, LCSW Phone Number: 08/26/2021, 12:44 PM  Clinical Narrative:    12:44pm-[place] requested CSW fax updated clinicals to [facility]. CSW faxed them.   2pm-CSW received call from [facility] Newcomer [phone]) and she provided insurance authorization from [place], Ref# [id number]. Berkley Harvey is typically provided for 14 days. Updated from Wadie Lessen are due to to [employer] (p. [phone] [id number]/K. [phone]).  CSW spoke with patient's sister and explained that patient has used 26 of his 60 days with [place]. She reported understanding and stated that he has to do a spend down and does not yet qualify for Medicaid. CSW emailed her a list of SNF facilities so that she can compare pricing for private pay options.   Per MD, patient is not medically stable for discharge.    Expected Discharge Plan: Skilled Nursing Facility Barriers to Discharge: English as a second language teacher, Continued Medical Work up  Expected Discharge Plan and Services Expected Discharge Plan: Skilled Nursing Facility In-house Referral: Clinical Social Work   Post Acute Care Choice: Skilled Nursing Facility Living arrangements for the past 2 months: Single Family Home, Skilled Nursing Facility                                       Social Determinants of Health (SDOH) Interventions    Readmission Risk Interventions    07/04/2021    2:49 PM  Readmission Risk Prevention Plan  Transportation Screening Complete  PCP or Specialist Appt within 5-7 Days Complete  Home Care Screening Complete

## 2021-08-27 DIAGNOSIS — F0150 Vascular dementia without behavioral disturbance: Secondary | ICD-10-CM

## 2021-08-27 DIAGNOSIS — G9341 Metabolic encephalopathy: Secondary | ICD-10-CM

## 2021-08-27 DIAGNOSIS — T83511A Infection and inflammatory reaction due to indwelling urethral catheter, initial encounter: Secondary | ICD-10-CM

## 2021-08-27 DIAGNOSIS — N390 Urinary tract infection, site not specified: Secondary | ICD-10-CM

## 2021-08-27 DIAGNOSIS — E119 Type 2 diabetes mellitus without complications: Secondary | ICD-10-CM | POA: Diagnosis not present

## 2021-08-27 DIAGNOSIS — Z515 Encounter for palliative care: Secondary | ICD-10-CM | POA: Diagnosis not present

## 2021-08-27 DIAGNOSIS — K922 Gastrointestinal hemorrhage, unspecified: Secondary | ICD-10-CM | POA: Diagnosis not present

## 2021-08-27 DIAGNOSIS — R4189 Other symptoms and signs involving cognitive functions and awareness: Secondary | ICD-10-CM

## 2021-08-27 DIAGNOSIS — N401 Enlarged prostate with lower urinary tract symptoms: Secondary | ICD-10-CM | POA: Diagnosis not present

## 2021-08-27 DIAGNOSIS — R410 Disorientation, unspecified: Secondary | ICD-10-CM | POA: Diagnosis not present

## 2021-08-27 DIAGNOSIS — D62 Acute posthemorrhagic anemia: Secondary | ICD-10-CM | POA: Diagnosis not present

## 2021-08-27 LAB — CBC WITH DIFFERENTIAL/PLATELET
Abs Immature Granulocytes: 0.05 10*3/uL (ref 0.00–0.07)
Basophils Absolute: 0 10*3/uL (ref 0.0–0.1)
Basophils Relative: 0 %
Eosinophils Absolute: 0 10*3/uL (ref 0.0–0.5)
Eosinophils Relative: 0 %
HCT: 25.5 % — ABNORMAL LOW (ref 39.0–52.0)
Hemoglobin: 8.3 g/dL — ABNORMAL LOW (ref 13.0–17.0)
Immature Granulocytes: 1 %
Lymphocytes Relative: 9 %
Lymphs Abs: 0.8 10*3/uL (ref 0.7–4.0)
MCH: 28.4 pg (ref 26.0–34.0)
MCHC: 32.5 g/dL (ref 30.0–36.0)
MCV: 87.3 fL (ref 80.0–100.0)
Monocytes Absolute: 0.8 10*3/uL (ref 0.1–1.0)
Monocytes Relative: 9 %
Neutro Abs: 7.5 10*3/uL (ref 1.7–7.7)
Neutrophils Relative %: 81 %
Platelets: 301 10*3/uL (ref 150–400)
RBC: 2.92 MIL/uL — ABNORMAL LOW (ref 4.22–5.81)
RDW: 16.1 % — ABNORMAL HIGH (ref 11.5–15.5)
WBC: 9.2 10*3/uL (ref 4.0–10.5)
nRBC: 0 % (ref 0.0–0.2)

## 2021-08-27 LAB — PHOSPHORUS: Phosphorus: 3.1 mg/dL (ref 2.5–4.6)

## 2021-08-27 LAB — GLUCOSE, CAPILLARY
Glucose-Capillary: 113 mg/dL — ABNORMAL HIGH (ref 70–99)
Glucose-Capillary: 121 mg/dL — ABNORMAL HIGH (ref 70–99)
Glucose-Capillary: 170 mg/dL — ABNORMAL HIGH (ref 70–99)
Glucose-Capillary: 174 mg/dL — ABNORMAL HIGH (ref 70–99)
Glucose-Capillary: 185 mg/dL — ABNORMAL HIGH (ref 70–99)
Glucose-Capillary: 191 mg/dL — ABNORMAL HIGH (ref 70–99)
Glucose-Capillary: 79 mg/dL (ref 70–99)

## 2021-08-27 LAB — COMPREHENSIVE METABOLIC PANEL
ALT: 18 U/L (ref 0–44)
AST: 24 U/L (ref 15–41)
Albumin: 2.2 g/dL — ABNORMAL LOW (ref 3.5–5.0)
Alkaline Phosphatase: 112 U/L (ref 38–126)
Anion gap: 9 (ref 5–15)
BUN: 15 mg/dL (ref 6–20)
CO2: 22 mmol/L (ref 22–32)
Calcium: 8.1 mg/dL — ABNORMAL LOW (ref 8.9–10.3)
Chloride: 100 mmol/L (ref 98–111)
Creatinine, Ser: 0.81 mg/dL (ref 0.61–1.24)
GFR, Estimated: >60 mL/min (ref >60)
Glucose, Bld: 179 mg/dL — ABNORMAL HIGH (ref 70–99)
Potassium: 3.6 mmol/L (ref 3.5–5.1)
Sodium: 131 mmol/L — ABNORMAL LOW (ref 135–145)
Total Bilirubin: 0.3 mg/dL (ref 0.3–1.2)
Total Protein: 6.1 g/dL — ABNORMAL LOW (ref 6.5–8.1)

## 2021-08-27 LAB — MAGNESIUM: Magnesium: 1.8 mg/dL (ref 1.7–2.4)

## 2021-08-27 LAB — AMMONIA: Ammonia: 35 umol/L (ref 9–35)

## 2021-08-27 MED ORDER — ENSURE ENLIVE PO LIQD
237.0000 mL | Freq: Three times a day (TID) | ORAL | Status: DC
  Administered 2021-08-27 – 2021-08-31 (×12): 237 mL via ORAL

## 2021-08-27 MED ORDER — POTASSIUM CHLORIDE 20 MEQ PO PACK
40.0000 meq | PACK | Freq: Once | ORAL | Status: AC
  Administered 2021-08-27: 40 meq via ORAL
  Filled 2021-08-27: qty 2

## 2021-08-27 NOTE — Progress Notes (Signed)
PROGRESS NOTE  Holley DexterManny Taketa WGN:[id number] DOB: 01/26/1961   PCP: Pearline Cablesopland, Jessica C, MD  Patient is from: Home.  Lives alone.  Uses rollator at baseline.  DOA: 08/19/2021 LOS: 8  Chief complaints Chief Complaint  Patient presents with   Rectal Bleeding     Brief Narrative / Interim history: 61 year old M with PMH of IDDM-2, CAD/CABG, BPH/urinary retention with chronic foley, hypotension on midodrine, severe malnutrition, hemorrhoids, GERD and mild cognitive impairment and HLD presenting with hematochezia for about a week, and admitted for possible lower GI bleed.  Hgb 10.5 (about baseline).  He also had over 60 pound weight loss in 6 months.  GI consulted.  Next day, Hgb down to 7.7.  Reportedly had a large stool with dark blood.  He was transfused 1 unit with appropriate response.  EGD on 6/11 basically normal.  Colonoscopy showed hemorrhoid, polyp and diverticulosis in ascending colon and solitary rectal ulcer felt to be due to constipation.  H&H has been stable.  Severe orthostatic hypotension with SBP dropping from 120s to 60s just with sitting in bed.  Now with encephalopathy in the setting of Candida associated UTI.  Blood cultures NGTD.  On IV ceftriaxone pending urine culture.  Therapy recommended SNF.  Palliative following.    Subjective: Seen and examined earlier this morning.  No major events overnight of this morning.  He is awake and alert today.  He is oriented to self and "hospital" but not able to tell me the name of the hospital.  He responds no to pain.   Objective: Vitals:   08/27/21 0000 08/27/21 0310 08/27/21 0834 08/27/21 1200  BP: 105/63 104/64 104/73 123/63  Pulse: 75 77 76 76  Resp: 11 14 20 12   Temp: 98.5 F (36.9 C) 98.9 F (37.2 C) 98.6 F (37 C)   TempSrc: Oral Axillary Axillary   SpO2: 100% 100% 100% 100%  Weight:      Height:        Examination:  GENERAL: Frail and chronically ill-appearing. HEENT: MMM.  Vision and hearing grossly  intact.  NECK: Supple.  No apparent JVD.  RESP:  No IWOB.  Fair aeration bilaterally. CVS:  RRR. Heart sounds normal.  ABD/GI/GU: BS+. Abd soft, NTND.  Indwelling Foley. MSK/EXT: Significant muscle mass and subcu fat loss. SKIN: no apparent skin lesion or wound NEURO: Awake and alert. Oriented to self and "hospital".  No apparent focal neuro deficit. PSYCH: Calm.  No distress or agitation.  Procedures:  6/11-EGD and colonoscopy as above.  Microbiology summarized: None  Assessment and plan Principal Problem:   Acute lower GI bleeding Active Problems:   Uncontrolled IDDM-2 with hyperglycemia   Acute blood loss anemia   CAD S/p remote CABG   BPH (benign prostatic hyperplasia)   Leukocytosis   HLD (hyperlipidemia)   Orthostatic hypotension   Protein-calorie malnutrition, severe (HCC)   Hypomagnesemia   Physical deconditioning   Unintentional weight loss of more than 10% body weight within 6 months   Hemorrhoids   Diverticulosis of colon   Solitary rectal ulcer   Polyp of ascending colon   Hypokalemia   Delirium   Confusion   Goals of care, counseling/discussion  Symptomatic ABLA due to acute lower GI bleed: hematochezia for about a week.  Over 60 pounds weight loss in 6 months.  CT abdomen and pelvis without significant finding.  EGD normal.  Colonoscopy with hemorrhoid, a polyp and diverticulosis in the ascending colon and solitary rectal ulcer.  Hgb reached nadir at  7.7.  Transfused 1 unit with appropriate response.  Anemia panel consistent with anemia of chronic disease Recent Labs    08/23/21 0841 08/24/21 0027 08/24/21 0540 08/24/21 0855 08/24/21 1113 08/24/21 1914 08/25/21 0334 08/25/21 1202 08/26/21 0041 08/27/21 0049  HGB 8.3* 8.8* 9.0* 8.3* 8.7* 9.0* 8.2*  8.0* 8.4* 8.9* 8.3*  -Monitor H&H.  Transfuse for Hgb < 8.0. -Continue soft diet.  -Continue holding aspirin -MiraLAX every other day. -SCD for VTE prophylaxis -GI recommends colonoscopy in 7  years  Diverticulosis/solitary rectal ulcer/polyps of ascending colon/hemorrhoids -MiraLAX every other day -Continue Anusol cream  Acute metabolic encephalopathy/delirium with underlying cognitive impairment: Multifactorial including UTI, underlying cognitive decline, severe malnutrition and hospital delirium.  Ammonia and B12 within normal.  No focal neurodeficit to suggest CVA. -Appreciate input by neurology-recommended outpatient follow-up as previously planned -Treat UTI as below. -Reorientation and delirium precautions -Completed high-dose thiamine without significant change.  Acute catheter associated urinary tract infection-patient with chronic Foley due to urinary retention.  UA obtained from new catheter concerning for UTI. -He failed voiding trial on 6/16.  Replaced Foley catheter -Continue IV ceftriaxone pending urine culture   Uncontrolled IDDM-2 with hyperglycemia and hypoglycemia: A1c is 10.4% on 07/03/2021.  On Lantus 14 units, sliding scale and metformin at home.  Hyperglycemia likely due to basal insulin. Recent Labs  Lab 08/26/21 2040 08/27/21 0053 08/27/21 0338 08/27/21 0836 08/27/21 1142  GLUCAP 136* 185* 121* 79 113*  -Continue SSI-sensitive  -Semglee 5 units daily -Continue home statin. -Decreased Crestor to 10 mg daily.  Discontinued Zetia.   History of CAD/remote CABG: Stable.  No cardiopulmonary symptoms. -Continue home medications except aspirin  Orthostatic hypotension: He takes midodrine 10 mg daily although it was prescribed 3 times daily.  TSH and cortisol within normal.  His recent TTE in 03/2021 without significant finding.  I think this is likely from severe malnutrition and some degree of autonomic dysregulation. -IV NS at 60 cc an hour -Continue midodrine 10 mg 3 times daily. -Elevate head of bed to 45 degree -Discontinued Remeron, Seroquel and Flomax. -Decrease gabapentin to 100 mg at night -Ace wrap for his leg and abdominal binder -Daily  orthostatic vitals  BPH/urinary retention with chronic Foley: Followed by [facility].  Lost to follow-up.   -Discontinued Flomax given significant orthostatic hypotension.  No prostamegaly on CT. -Discussed with urology, Dr. Alvester Morin.  Okay to do voiding trial with bethanechol while working up for possible UTI. -He failed voiding trial.  Foley catheter replaced on 6/16. -Reach out to urology once we know the SNF he goes to so they can set up outpatient follow-up.  Hypokalemia/hypomagnesemia:  -Monitor and replenish as appropriate.  History of GERD: EGD normal -Continue PPI  Leukocytosis: Likely due to UTI.  Resolved after starting antibiotic.  Severe malnutrition/unintentional weight loss/FTT: Body mass index is 14.53 kg/m.  Seems to have lost 25 kg in the last 5 months.  Significant muscle mass and subcu fat loss.  Prealbumin 9.2.  TSH normal.  HIV nonreactive.  No explanation on EGD, [place], CT abdomen and pelvis. Wt Readings from Last 10 Encounters:  08/19/21 40.8 kg  07/13/21 54.2 kg  07/06/21 54.2 kg  06/06/21 56 kg  05/09/21 58.9 kg  05/05/21 58 kg  05/04/21 56.6 kg  04/24/21 58 kg  04/13/21 61.9 kg  04/07/21 66.5 kg  Nutrition Problem: Severe Malnutrition Etiology: chronic illness (diabetes, CAD, cognitive decline) Signs/Symptoms: severe fat depletion, severe muscle depletion, percent weight loss Percent weight loss: 37.5 % Interventions:  MVI, Snacks, Ensure Enlive (each supplement provides 350kcal and 20 grams of protein)  Goal of care counseling/physical deconditioning: Poor prognosis.  See discussion on 6/16. -Palliative care following.  Remains full code -Sister coming from [place] to establish HCPOA and discuss further goal of care  DVT prophylaxis:  SCDs Start: 08/19/21 2220 in the setting of GI bleed  Code Status: Full code Family Communication: Updated patient's sister over the phone. Level of care: Telemetry Medical Status is: Inpatient Remains inpatient  appropriate because: Catheter associated urinary tract infection, orthostatic hypotension, metabolic encephalopathy and failure to thrive   Final disposition: SNF Consultants:  Gastroenterology Palliative medicine  Sch Meds:  Scheduled Meds:  Chlorhexidine Gluconate Cloth  6 each Topical Daily   docusate sodium  100 mg Oral Daily   feeding supplement  237 mL Oral TID BM   insulin aspart  0-9 Units Subcutaneous Q4H   insulin glargine-yfgn  5 Units Subcutaneous Daily   mouth rinse  15 mL Mouth Rinse BID   midodrine  10 mg Oral TID WC   multivitamin with minerals  1 tablet Oral Daily   pantoprazole  40 mg Oral Daily   polyethylene glycol  17 g Oral QODAY   rosuvastatin  10 mg Oral Daily   thiamine  100 mg Oral Daily   Continuous Infusions:  sodium chloride 60 mL/hr at 08/27/21 0441   cefTRIAXone (ROCEPHIN)  IV Stopped (08/26/21 2125)   lactated ringers Stopped (08/21/21 1949)   PRN Meds:.acetaminophen **OR** acetaminophen, hydrocortisone-pramoxine  Antimicrobials: Anti-infectives (From admission, onward)    Start     Dose/Rate Route Frequency Ordered Stop   08/26/21 1915  cefTRIAXone (ROCEPHIN) 1 g in sodium chloride 0.9 % 100 mL IVPB        1 g 200 mL/hr over 30 Minutes Intravenous Every 24 hours 08/26/21 1821          I have personally reviewed the following labs and images: CBC: Recent Labs  Lab 08/24/21 0027 08/24/21 0540 08/24/21 0855 08/24/21 1113 08/24/21 1914 08/25/21 0334 08/25/21 1202 08/26/21 0041 08/27/21 0049  WBC 8.7  --  9.1  --   --  7.2  --  13.7* 9.2  NEUTROABS  --   --   --   --   --   --   --   --  7.5  HGB 8.8*   < > 8.3*   < > 9.0* 8.2*  8.0* 8.4* 8.9* 8.3*  HCT 25.8*   < > 24.7*   < > 26.3* 23.7*  23.6* 25.4* 26.5* 25.5*  MCV 84.3  --  83.7  --   --  84.3  --  85.2 87.3  PLT 313  --  271  --   --  292  --  319 301   < > = values in this interval not displayed.   BMP &GFR Recent Labs  Lab 08/23/21 0841 08/24/21 0027  08/25/21 0334 08/26/21 0041 08/27/21 0049  NA 137 135 137 133* 131*  K 3.7 3.4* 4.0 4.5 3.6  CL 106 102 104 101 100  CO2 25 24 23 22 22   GLUCOSE 90 230* 142* 151* 179*  BUN 5* 5* 8 13 15   CREATININE 0.64 0.66 0.64 0.72 0.81  CALCIUM 8.5* 8.5* 8.5* 8.7* 8.1*  MG 1.5* 2.0 1.6* 1.7 1.8  PHOS 3.2 3.3 3.0 2.2* 3.1   Estimated Creatinine Clearance: 57.4 mL/min (by C-G formula based on SCr of 0.81 mg/dL). Liver & Pancreas: Recent Labs  Lab  08/23/21 0841 08/24/21 0027 08/25/21 0334 08/26/21 0041 08/27/21 0049  AST  --   --   --   --  24  ALT  --   --   --   --  18  ALKPHOS  --   --   --   --  112  BILITOT  --   --   --   --  0.3  PROT  --   --   --   --  6.1*  ALBUMIN 2.1* 2.1* 2.0* 2.3* 2.2*   No results for input(s): "LIPASE", "AMYLASE" in the last 168 hours. Recent Labs  Lab 08/26/21 1539 08/27/21 0049  AMMONIA 23 35   Diabetic: No results for input(s): "HGBA1C" in the last 72 hours. Recent Labs  Lab 08/26/21 2040 08/27/21 0053 08/27/21 0338 08/27/21 0836 08/27/21 1142  GLUCAP 136* 185* 121* 79 113*   Cardiac Enzymes: No results for input(s): "CKTOTAL", "CKMB", "CKMBINDEX", "TROPONINI" in the last 168 hours. No results for input(s): "PROBNP" in the last 8760 hours. Coagulation Profile: Recent Labs  Lab 08/24/21 0540  INR 1.0   Thyroid Function Tests: No results for input(s): "TSH", "T4TOTAL", "FREET4", "T3FREE", "THYROIDAB" in the last 72 hours.  Lipid Profile: No results for input(s): "CHOL", "HDL", "LDLCALC", "TRIG", "CHOLHDL", "LDLDIRECT" in the last 72 hours. Anemia Panel: No results for input(s): "VITAMINB12", "FOLATE", "FERRITIN", "TIBC", "IRON", "RETICCTPCT" in the last 72 hours.  Urine analysis:    Component Value Date/Time   COLORURINE AMBER (A) 08/26/2021 1934   APPEARANCEUR CLOUDY (A) 08/26/2021 1934   APPEARANCEUR Hazy (A) 05/09/2021 1006   LABSPEC 1.016 08/26/2021 1934   PHURINE 5.0 08/26/2021 1934   GLUCOSEU NEGATIVE 08/26/2021 1934    HGBUR NEGATIVE 08/26/2021 1934   BILIRUBINUR NEGATIVE 08/26/2021 1934   BILIRUBINUR Negative 05/09/2021 1006   KETONESUR NEGATIVE 08/26/2021 1934   PROTEINUR NEGATIVE 08/26/2021 1934   NITRITE NEGATIVE 08/26/2021 1934   LEUKOCYTESUR LARGE (A) 08/26/2021 1934   Sepsis Labs: Invalid input(s): "PROCALCITONIN", "LACTICIDVEN"  Microbiology: Recent Results (from the past 240 hour(s))  Culture, blood (Routine X 2) w Reflex to ID Panel     Status: None (Preliminary result)   Collection Time: 08/26/21  3:32 PM   Specimen: BLOOD  Result Value Ref Range Status   Specimen Description BLOOD RIGHT ANTECUBITAL  Final   Special Requests   Final    BOTTLES DRAWN AEROBIC AND ANAEROBIC Blood Culture results may not be optimal due to an inadequate volume of blood received in culture bottles   Culture   Final    NO GROWTH < 12 HOURS Performed at [facility], [address], [place], [place] [address]    Report Status PENDING  Incomplete  Culture, blood (Routine X 2) w Reflex to ID Panel     Status: None (Preliminary result)   Collection Time: 08/26/21  3:39 PM   Specimen: BLOOD RIGHT HAND  Result Value Ref Range Status   Specimen Description BLOOD RIGHT HAND  Final   Special Requests   Final    BOTTLES DRAWN AEROBIC ONLY Blood Culture results may not be optimal due to an inadequate volume of blood received in culture bottles   Culture   Final    NO GROWTH < 12 HOURS Performed at [facility], [address], [place], [place] [address]    Report Status PENDING  Incomplete    Radiology Studies: No results found.  Shawnmichael Parenteau T. Lesley Galentine [name]  If 7PM-7AM, please contact  night-coverage [email or web address] 08/27/2021, 3:02 PM

## 2021-08-27 NOTE — Progress Notes (Signed)
Palliative Medicine Inpatient Follow Up Note  HPI: 61 year old M with PMH of IDDM-2, CAD/CABG, BPH/urinary retention with chronic foley, hypotension on midodrine, severe malnutrition, hemorrhoids, GERD and mild cognitive impairment and HLD presenting with hematochezia for about a week, and admitted for possible lower GI bleed.  Hgb 10.5 (about baseline).  He also had over 60 pound weight loss in 6 months.  GI consulted.  Palliative care has been asked to get involved to discuss goals of care in the setting of patient's chronic comorbid conditions, rehospitalization's, and increasingly frail state.  Today's Discussion 08/27/2021  *Please note that this is a verbal dictation therefore any spelling or grammatical errors are due to the "[place] [employer]" system interpretation.  Chart reviewed inclusive of vital signs, progress notes, laboratory results, and diagnostic images.   I met with Kishawn at bedside this morning. Per his evening Rn, he has been more alert. I met at bedside with [facility]. He shared that he believed we were in a parking lot. He knew his name though knew little else about his place and situation.  _______________________________  I called patients sister, [name]. She shares with me that Monday is [place] and she worries that because of the his advance directives will not be complete. WE discussed the importance of coming to see [facility] for a better impression of his clinical state. [name] feels that when Wilkin has been like this in the past it is in the setting of a UTI - he has received treatment and improved but never back to baseline. [name] expresses the desire to have both a urologist and neurologist see Tyrin in house. I shared that I do not control consults but I can alert the primary team.   Otherwise, [name] is willing to meet with the Palliative care team on Monday in the late afternoon if Kerri has not been discharged.   Questions and concerns addressed   Palliative  Support Provided.   Objective Assessment: Vital Signs Vitals:   08/27/21 0310 08/27/21 0834  BP: 104/64 104/73  Pulse: 77 76  Resp: 14 20  Temp: 98.9 F (37.2 C) 98.6 F (37 C)  SpO2: 100% 100%    Intake/Output Summary (Last 24 hours) at 08/27/2021 1208 Last data filed at 08/27/2021 1027 Gross per 24 hour  Intake 1230 ml  Output 350 ml  Net 880 ml   Last Weight  Most recent update: 08/19/2021  6:07 PM    Weight  40.8 kg (90 lb)            Gen: Extremely frail middle-aged [place] male HEENT: Dry mucous membranes CV: Regular rate and rhythm PULM: On room air breathing is even and nonlabored ABD: soft/nontender EXT: No edema Neuro: Disoriented  SUMMARY OF RECOMMENDATIONS   Full Code/Full scope of care --> Sent a copy of Hard Choices book   Patients sister is hopeful for improvement/recovery - She is having a hard time understanding patients poor health state   Patients sister plans to be here on Monday to complete advance directives  Patients sister requests consultation of both Urology and Neurology   Ongoing Palliative Care support  Billing based on MDM: High ______________________________________________________________________________________ [place] Team Team Cell Phone: [phone] Please utilize secure chat with additional questions, if there is no response within 30 minutes please call the above phone number  Palliative Medicine Team providers are available by phone from 7am to 7pm daily and can be reached through the team cell phone.  Should  this patient require assistance outside of these hours, please call the patient's attending physician.

## 2021-08-27 NOTE — Progress Notes (Addendum)
Nutrition Follow-up  DOCUMENTATION CODES:   Severe malnutrition in context of chronic illness, Underweight  INTERVENTION:   -Initiate 48 hour calorie count per MD; RD will follow-up on Monday, 08/29/21 for further results -Increase Ensure Enlive po to TID, each supplement provides 350 kcal and 20 grams of protein -Continue MVI with minerals daily -Continue snacks TID between meals  NUTRITION DIAGNOSIS:   Severe Malnutrition related to chronic illness (diabetes, CAD, cognitive decline) as evidenced by severe fat depletion, severe muscle depletion, percent weight loss.  Ongoing  GOAL:   Patient will meet greater than or equal to 90% of their needs  Progressing  MONITOR:   PO intake, Supplement acceptance, Labs, Weight trends, Skin  REASON FOR ASSESSMENT:   Consult Assessment of nutrition requirement/status  ASSESSMENT:   Pt admitted with rectal bleeding, found to have acute lower GIB. PMH significant for diabetes, hypotension, CAD s/p CABG, BPH, HLD, GERD, and cognitive decline/?dementia.  Reviewed I/O's: +930 ml x 24 hours and -5.3 L since admission  UOP:350 ml x 24 hours   Pt unavailable at time of visit. Attempted to speak with pt via call to hospital room phone, however, unable to reach.   Palliative care team requesting calorie count. No meal completions documented in chart since 08/24/21. Per MAR, pt is accepting Ensure supplements. He remains on a dysphagia 3 diet.   Per palliative care notes, pt sister is hopeful for improvement and will arrive Monday for further discussions.   Medications reviewed and include colace, miralax, thiamine, and 0.9% sodium chloride infusion @ 60 ml/hr. .   Labs reviewed: Na:131, CBGS: 79-185 (inpatient orders for glycemic control are 0-9 units insulin aspart every 4 hours and 5 units insulin glargine-yfgn daily).    Diet Order:   Diet Order             DIET DYS 3 Room service appropriate? Yes with Assist; Fluid consistency:  Thin  Diet effective now                   EDUCATION NEEDS:   No education needs have been identified at this time  Skin:  Skin Assessment: Reviewed RN Assessment Skin Integrity Issues:: Stage II Stage II: R buttock  Last BM:  6/13  Height:   Ht Readings from Last 1 Encounters:  08/19/21 5\' 6"  (1.676 m)    Weight:   Wt Readings from Last 1 Encounters:  08/19/21 40.8 kg   BMI:  Body mass index is 14.53 kg/m.  Estimated Nutritional Needs:   Kcal:  1400-1600  Protein:  70-85g  Fluid:  >/=1.4L    10/19/21, RD, LDN, CDCES Registered Dietitian II Certified Diabetes Care and Education Specialist Please refer to [facility] for RD and/or RD on-call/weekend/after hours pager

## 2021-08-27 NOTE — Consult Note (Signed)
Neurology Consultation  Reason for Consult: decline in cognitive function Referring Physician: Alanda Slim  CC: confusion  History is obtained from: Chart, physician  HPI: Charles [name] is a 61 y.o. 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 He recently had a new patient appointment with Dr. Marjory Lies at [facility] for cognitive decline and weakness of both lower extremities that has worsened since he stopped taking his diabetes medications. Dr. Marjory Lies determined that this was likely a combination of diabetic neuropathy and vascular cognitive decline, decondition, and protein calorie malnutrition.   ROS: Unable to obtain due to altered mental status.   Past Medical History:  Diagnosis Date   Arthritis    Diabetes mellitus without complication (HCC)    Heart disease    Hypertension     Family History  Problem Relation Age of Onset   Diabetes Mother    Hypertension Mother    Diabetes Father    Hypertension Father    Heart attack Father    Diabetes Sister    Diabetes Sister     Social History:   reports that he has never smoked. He has never used smokeless tobacco. He reports that he does not currently use alcohol. He reports that he does not use drugs.  Medications  Current Facility-Administered Medications:    0.9 %  sodium chloride infusion, , Intravenous, Continuous, Gonfa, Taye T, MD, Last Rate: 60 mL/hr at 08/27/21 0441, New Bag at 08/27/21 0441   acetaminophen (TYLENOL) tablet 650 mg, 650 mg, Oral, Q6H PRN, 650 mg at 08/26/21 1810 **OR** acetaminophen (TYLENOL) suppository 650 mg, 650 mg, Rectal, Q6H PRN, John Giovanni, MD   cefTRIAXone (ROCEPHIN) 1 g in sodium chloride 0.9 % 100 mL IVPB, 1 g, Intravenous, Q24H,  Gonfa, Taye T, MD, Stopped at 08/26/21 2125   Chlorhexidine Gluconate Cloth 2 % PADS 6 each, 6 each, Topical, Daily, Alanda Slim, Taye T, MD, 6 each at 08/27/21 0919   docusate sodium (COLACE) capsule 100 mg, 100 mg, Oral, Daily, Gonfa, Taye T, MD, 100 mg at 08/27/21 0917   feeding supplement (ENSURE ENLIVE / ENSURE PLUS) liquid 237 mL, 237 mL, Oral, BID BM, Gonfa, Taye T, MD, 237 mL at 08/27/21 1233   hydrocortisone-pramoxine (PROCTOFOAM-HC) rectal foam 1 applicator, 1 applicator, Rectal, Q6H PRN, Candelaria Stagers T, MD, 1 applicator at 08/23/21 0037   insulin aspart (novoLOG) injection 0-9 Units, 0-9 Units, Subcutaneous, Q4H, John Giovanni, MD, 1 Units at 08/27/21 0438   insulin glargine-yfgn (SEMGLEE) injection 5 Units, 5 Units, Subcutaneous, Daily, Alanda Slim, Taye T, MD, 5 Units at 08/26/21 0836   lactated ringers infusion, , Intravenous, Continuous, Jenel Lucks, MD, Stopped at 08/21/21 1949   MEDLINE mouth rinse, 15 mL, Mouth Rinse, BID, Gonfa, Taye T, MD, 15 mL at 08/27/21 0919   midodrine (PROAMATINE) tablet 10 mg, 10 mg, Oral, TID WC, Gonfa, Taye T, MD, 10 mg at 08/27/21 1232   multivitamin with minerals tablet 1 tablet, 1 tablet, Oral, Daily, Alanda Slim, Taye T, MD, 1 tablet at 08/27/21 0917   pantoprazole (PROTONIX) EC tablet 40 mg, 40 mg, Oral, Daily, Gonfa, Taye T, MD, 40 mg at 08/27/21 0917   polyethylene glycol (MIRALAX / GLYCOLAX) packet 17 g, 17 g, Oral, QODAY, Gonfa, Taye T, MD, 17 g at 08/26/21 0836   rosuvastatin (CRESTOR) tablet 10 mg, 10  mg, Oral, Daily, Candelaria Stagers T, MD, 10 mg at 08/27/21 0917   thiamine tablet 100 mg, 100 mg, Oral, Daily, Candelaria Stagers T, MD, 100 mg at 08/26/21 0934  Exam: Current vital signs: BP 104/73 (BP Location: Left Arm)   Pulse 76   Temp 98.6 F (37 C) (Axillary)   Resp 20   Ht 5\' 6"  (1.676 m)   Wt 40.8 kg   SpO2 100%   BMI 14.53 kg/m  Vital signs in last 24 hours: Temp:  [98 F (36.7 C)-101.2 F (38.4 C)] 98.6 F (37 C) (06/17 0834) Pulse Rate:   [72-94] 76 (06/17 0834) Resp:  [11-20] 20 (06/17 0834) BP: (89-112)/(56-73) 104/73 (06/17 0834) SpO2:  [99 %-100 %] 100 % (06/17 0834)  GENERAL: Awake, alert, in no acute distress Psych: Affect appropriate for situation, patient is calm and cooperative with examination Head: Normocephalic and atraumatic, without obvious abnormality EENT: Normal conjunctivae, dry mucous membranes, no OP obstruction LUNGS: Normal respiratory effort. Non-labored breathing on room air CV: Regular rate and rhythm on telemetry ABDOMEN: Soft, non-tender, non-distended Extremities: warm, well perfused, without obvious deformity  NEURO:  Mental Status: Awake, alert, and oriented to person and year, when asked the location he states "cov[place]" and that he is here for his "covid symptoms." He was able to tell me that "Biden" is president, he does not know who was president prior. He is not able to provide any history. Speech/Language: speech is clear.   He is not able to name animals that walk on four legs, remember words, or do simple addition. Unable to identify object on his tray, but was able to use a spoon to feed himself with prompting.  No neglect is noted Cranial Nerves:  II: PERRL3 mm/brisk. visual fields full.  III, IV, VI: EOMI. Lid elevation symmetric and full.  V: Sensation is intact to light touch and symmetrical to face. Blinks to threat. Moves jaw back and forth.  VII: Face is symmetric resting and smiling. Able to puff cheeks and raise eyebrows.  VIII: Hearing intact to voice IX, X: Palate elevation is symmetric. Phonation normal.  XI: Normal sternocleidomastoid and trapezius muscle strength XII: Tongue protrudes midline without fasciculations.   Motor: 5/5 strength is all muscle groups.  Tone is normal. Bulk is normal.  Sensation: Intact to light touch bilaterally in all four extremities. No extinction to DSS present.  Coordination: No  ataxia noted, but he does have a slight upper extremity  tremor DTRs: 2+ throughout.  Gait: Deferred   Labs I have reviewed labs in epic and the results pertinent to this consultation are:  Vitamin B12- 350 TSH- 3.784   CBC    Component Value Date/Time   WBC 9.2 08/27/2021 0049   RBC 2.92 (L) 08/27/2021 0049   HGB 8.3 (L) 08/27/2021 0049   HCT 25.5 (L) 08/27/2021 0049   PLT 301 08/27/2021 0049   MCV 87.3 08/27/2021 0049   MCH 28.4 08/27/2021 0049   MCHC 32.5 08/27/2021 0049   RDW 16.1 (H) 08/27/2021 0049   LYMPHSABS 0.8 08/27/2021 0049   MONOABS 0.8 08/27/2021 0049   EOSABS 0.0 08/27/2021 0049   BASOSABS 0.0 08/27/2021 0049    CMP     Component Value Date/Time   NA 131 (L) 08/27/2021 0049   NA 135 12/23/2020 1140   K 3.6 08/27/2021 0049   CL 100 08/27/2021 0049   CO2 22 08/27/2021 0049   GLUCOSE 179 (H) 08/27/2021 0049   BUN 15 08/27/2021 0049  BUN 14 12/23/2020 1140   CREATININE 0.81 08/27/2021 0049   CALCIUM 8.1 (L) 08/27/2021 0049   PROT 6.1 (L) 08/27/2021 0049   ALBUMIN 2.2 (L) 08/27/2021 0049   AST 24 08/27/2021 0049   ALT 18 08/27/2021 0049   ALKPHOS 112 08/27/2021 0049   BILITOT 0.3 08/27/2021 0049   GFRNONAA >60 08/27/2021 0049    Lipid Panel     Component Value Date/Time   CHOL 138 07/03/2021 0142   CHOL 328 (H) 12/23/2020 1140   TRIG 95 07/03/2021 0142   HDL 34 (L) 07/03/2021 0142   HDL 46 12/23/2020 1140   CHOLHDL 4.1 07/03/2021 0142   VLDL 19 07/03/2021 0142   LDLCALC 85 07/03/2021 0142   LDLCALC 231 (H) 12/23/2020 1140     Imaging I have reviewed the images obtained:  MRI examination of the brain 1. No acute intracranial abnormality. 2. 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 3. Ventriculomegaly with mild periventricular T2/FLAIR signal abnormality, stable. 4. Small remote right basal ganglia lacunar infarct.  Assessment:  61 year old male presenting with possible GIB, found to have UTI and increased confusion. This improved with antibiotics and he has an established  neurologist with whom he can follow up.   Impression: Vascular cognitive decline with increased confusion due to UTI  Recommendations: - Follow up outpatient with Dr. Marjory Lies at [facility] - try to minimize deliriogenic medications as much as possible (J Am Geriatr Soc. 2012 Apr;60(4):616-31): benzodiazepines, anticholinergics, diphenhydramine, antihistamines, narcotics, Ambien/Lunesta/Sonata etc. - environmental support for delirium: Lights on during the day, patient up and out of bed as much as is feasible, OT/PT, quiet dimly lit room at night, reorient patient often, provide hearing aides and glasses if patient uses them routinely, minimize sleep disruptions as much as possible overnight.  As much as possible, reorient patient, and have them engage patient in activities, e.g. playing cards. TV should be off or on neutral background music unless patient engaged and watching. Try to keep interactions with the patient calm and quiet.    - Continue antibiotic treatment as recommended by primary team

## 2021-08-28 DIAGNOSIS — N401 Enlarged prostate with lower urinary tract symptoms: Secondary | ICD-10-CM | POA: Diagnosis not present

## 2021-08-28 DIAGNOSIS — Z515 Encounter for palliative care: Secondary | ICD-10-CM | POA: Diagnosis not present

## 2021-08-28 DIAGNOSIS — K922 Gastrointestinal hemorrhage, unspecified: Secondary | ICD-10-CM | POA: Diagnosis not present

## 2021-08-28 DIAGNOSIS — D62 Acute posthemorrhagic anemia: Secondary | ICD-10-CM | POA: Diagnosis not present

## 2021-08-28 DIAGNOSIS — Z7189 Other specified counseling: Secondary | ICD-10-CM | POA: Diagnosis not present

## 2021-08-28 DIAGNOSIS — G9341 Metabolic encephalopathy: Secondary | ICD-10-CM | POA: Diagnosis not present

## 2021-08-28 LAB — GLUCOSE, CAPILLARY
Glucose-Capillary: 122 mg/dL — ABNORMAL HIGH (ref 70–99)
Glucose-Capillary: 103 mg/dL — ABNORMAL HIGH (ref 70–99)
Glucose-Capillary: 280 mg/dL — ABNORMAL HIGH (ref 70–99)
Glucose-Capillary: 308 mg/dL — ABNORMAL HIGH (ref 70–99)
Glucose-Capillary: 317 mg/dL — ABNORMAL HIGH (ref 70–99)

## 2021-08-28 MED ORDER — DRONABINOL 2.5 MG PO CAPS
2.5000 mg | ORAL_CAPSULE | Freq: Two times a day (BID) | ORAL | Status: DC
  Administered 2021-08-28 – 2021-08-31 (×8): 2.5 mg via ORAL
  Filled 2021-08-28 (×8): qty 1

## 2021-08-28 NOTE — Progress Notes (Signed)
Palliative Medicine Inpatient Follow Up Note  HPI: 61 year old M with PMH of IDDM-2, CAD/CABG, BPH/urinary retention with chronic foley, hypotension on midodrine, severe malnutrition, hemorrhoids, GERD and mild cognitive impairment and HLD presenting with hematochezia for about a week, and admitted for possible lower GI bleed.  Hgb 10.5 (about baseline).  He also had over 60 pound weight loss in 6 months.  GI consulted.  Palliative care has been asked to get involved to discuss goals of care in the setting of patient's chronic comorbid conditions, rehospitalization's, and increasingly frail state.  Today's Discussion 08/28/2021  *Please note that this is a verbal dictation therefore any spelling or grammatical errors are due to the "[place] [employer]" system interpretation.  Chart reviewed inclusive of vital signs, progress notes, laboratory results, and diagnostic images.   I met with Charles [name] at bedside this morning, he was disoriented. He is only aware of himself though not much else per discussion with evening nurse he thought he was in the "covid hospital" and he has "covid". He lacks motivation to get OOB or to tolerate PO's.   I spoke to patients sister Charles [name] this morning. We reviewed again that Charles [name] is not thriving, if anything he is declining. I explained why in terms of his physical, mental, and nutritional states. Charles [name] asked if something can be added for appetite and I shared that I could add marinol. Charles [name] is overwhelmed over the phone she shares, "I need a professional to tell me what to do" she expresses her lack of knowledge in terms on medical care and how she is unsure what to do next. We reviewed that we are continuing efforts to improve Charles [name]'s condition though I am worried about the long term and likelihood of continued decline.   Charles [name] is in agreement with meeting the Palliative care team tomorrow afternoon. She shares that she will likely be at the hospital around [facility]. We  reviewed that I would request a colleague reaches out to set up a time.  In the meanwhile, I have requested Dr. Karleen Hampshire update Charles [name] from the primary team's perspective.   Objective Assessment: Vital Signs Vitals:   08/27/21 2304 08/28/21 0321  BP: 115/69 (!) 110/54  Pulse: 73 71  Resp: 18 14  Temp: 97.9 F (36.6 C) 98 F (36.7 C)  SpO2: 100% 100%    Intake/Output Summary (Last 24 hours) at 08/28/2021 1051 Last data filed at 08/28/2021 0413 Gross per 24 hour  Intake 1092.65 ml  Output 850 ml  Net 242.65 ml    Last Weight  Most recent update: 08/28/2021  5:00 AM    Weight  40 kg (88 lb 2.9 oz)            Gen: Extremely frail middle-aged [place] male HEENT: Dry mucous membranes CV: Regular rate and rhythm PULM: On room air breathing is even and nonlabored ABD: soft/nontender EXT: No edema Neuro: Disoriented  SUMMARY OF RECOMMENDATIONS   Full Code/Full scope of care --> Sent a copy of Hard Choices book   Patients sister is hopeful for improvement/recovery - -> She is having a hard time understanding patients poor health state. Dr. Karleen Hampshire plans to reach out today   Patients sister plans to be here tomorrow and is willing to meet with the Palliative care team for more conversations--> she shares she will be in the hospital around [place] support I will not be here tomorrow though I will request a colleague follow up  Billing based  on MDM: High ______________________________________________________________________________________ Charles [name] Team Team Cell Phone: [phone] Please utilize secure chat with additional questions, if there is no response within 30 minutes please call the above phone number  Palliative Medicine Team providers are available by phone from 7am to 7pm daily and can be reached through the team cell phone.  Should this patient require assistance outside of these hours, please call the  patient's attending physician.

## 2021-08-28 NOTE — Progress Notes (Signed)
[name]                                                                               [name], is a 61 y.o. male, DOB - 10/08/1960, GXQ:[id number] Admit date - 08/19/2021    Outpatient Primary MD for the patient is [name], [name], MD  LOS - 9  days    Brief summary   Charles [name] is a 61 y.o. 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 He also had over 60 pound weight loss in 6 months.  GI consulted.   Next day, Hgb down to 7.7.  Reportedly had a large stool with dark blood.  He was transfused 1 unit with appropriate response.  EGD on 6/11 basically normal.  Colonoscopy showed hemorrhoid, polyp and diverticulosis in ascending colon and solitary rectal ulcer felt to be due to constipation.   H&H has been stable.  Severe orthostatic hypotension with SBP dropping from 120s to 60s just with sitting in bed.  Now with encephalopathy in the setting of Candida associated UTI.  Blood cultures NGTD. Urine cultures pending.  On IV rocephin.  Therapy eval recommending SNF.  Palliative care consulted.   Assessment & Plan    Assessment and Plan:  Symptomatic ABLA due to acute lower GI bleed:  - Hematochezia for one week.  - 60 lbs weight loss in 6 months.  - CT abd and pelvis without significant finding.  -EGD is within normal limits Colonoscopy showed hemorrhoids, polyps, diverticulosis in the ascending colon and solitary rectal ulcer secondary to constipation. Biopsy of the polyps revealed benign in nature recommended colonoscopy in 7 years. Transfuse to keep hemoglobin greater than 8 MiraLAX every other day Continue holding aspirin recommend restarting it in 1 week.    Acute metabolic encephalopathy  delirium with underlying cognitive impairment Probably secondary to urinary tract infection/underlying cognitive decline, severe malnutrition and hospital-acquired delirium Neurology consulted recommended outpatient follow-up with Dr. Marjory Lies at [facility] neurology Try to minimize deliriogenic medications, benzodiazepines and anticholinergic medications. No focal deficits to suggest CVA. Completed high-dose thiamine without significant change. Ammonia and B12 levels within normal limits. Patient today is alert and oriented to person and place.   Diverticulosis/polyps of the ascending colon/solitary rectal ulcer/hemorrhoids MiraLAX every other day and Anusol cream.   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 Patient failed voiding trial on August 26, 2021. Replaced Foley catheter Continue with IV Rocephin pending urine culture.    Uncontrolled insulin-dependent diabetes mellitus with hyperglycemia and hypoglycemia Hemoglobin A1c is around 10.4 in April 2023 Continue with sliding scale insulin and low-dose Semglee.    Hyperlipidemia Continue with Crestor.    History of coronary artery disease s/p CABG in the past Patient currently denies any chest pain at this time Continue home medications except for aspirin.   Orthostatic hypotension Probably secondary to autonomic dysregulation and severe malnutrition Continue with compression stockings and abdominal binder along with midodrine.   BPH with urinary  retention and chronic Foley placement Recommend outpatient follow-up with urology. Complete the course of antibiotics for UTI. Discontinued Flomax given significant orthostatic hypotension.    Hypokalemia and hypomagnesemia Replace    GERD EGD within normal limits and continue with PPI   Severe malnutrition with unintentional weight loss/failure to thrive Patient appears to have lost weight in the last 6 months with  significant muscle mass and subcutaneous fat loss. HIV is nonreactive, no explanation on EGD, colonoscopy, CT abdomen and pelvis. TSH is within normal limits Probably secondary to chronic illness from coronary artery disease cognitive decline diabetes mellitus poor oral intake.  Dietary consulted and recommendation given for supplementations.    Physical deconditioning, cognitive decline poor prognosis Palliative care consulted for goals of care discussions.     RN Pressure Injury Documentation: Pressure Injury 07/02/21 Buttocks Right Stage 2 -  Partial thickness loss of dermis presenting as a shallow open injury with a red, pink wound bed without slough. (Active)  07/02/21 0000  Location: Buttocks  Location Orientation: Right  Staging: Stage 2 -  Partial thickness loss of dermis presenting as a shallow open injury with a red, pink wound bed without slough.  Wound Description (Comments):   Present on Admission:   Dressing Type Moisture barrier 08/28/21 0800    Malnutrition Type:  Nutrition Problem: Severe Malnutrition Etiology: chronic illness (diabetes, CAD, cognitive decline)   Malnutrition Characteristics:  Signs/Symptoms: severe fat depletion, severe muscle depletion, percent weight loss Percent weight loss: 37.5 %   Nutrition Interventions:  Interventions: MVI, Snacks, Ensure Enlive (each supplement provides 350kcal and 20 grams of protein)  Estimated body mass index is 14.23 kg/m as calculated from the following:   Height as of this encounter: 5\' 6"  (1.676 m).   Weight as of this encounter: 40 kg.  Code Status: full code.  DVT Prophylaxis:  SCDs Start: 08/19/21 2220   Level of Care: Level of care: Telemetry Medical Family Communication: none at bedside.   Disposition Plan:     Remains inpatient appropriate:    Procedures:  None.   Consultants:   Palliative care.  Neurology.   Antimicrobials:   Anti-infectives (From admission, onward)    Start      Dose/Rate Route Frequency Ordered Stop   08/26/21 1915  cefTRIAXone (ROCEPHIN) 1 g in sodium chloride 0.9 % 100 mL IVPB        1 g 200 mL/hr over 30 Minutes Intravenous Every 24 hours 08/26/21 1821          Medications  Scheduled Meds:  Chlorhexidine Gluconate Cloth  6 each Topical Daily   docusate sodium  100 mg Oral Daily   dronabinol  2.5 mg Oral BID AC   feeding supplement  237 mL Oral TID BM   insulin aspart  0-9 Units Subcutaneous Q4H   insulin glargine-yfgn  5 Units Subcutaneous Daily   mouth rinse  15 mL Mouth Rinse BID   midodrine  10 mg Oral TID WC   multivitamin with minerals  1 tablet Oral Daily   pantoprazole  40 mg Oral Daily   polyethylene glycol  17 g Oral QODAY   rosuvastatin  10 mg Oral Daily   thiamine  100 mg Oral Daily   Continuous Infusions:  sodium chloride 60 mL/hr at 08/27/21 1951   cefTRIAXone (ROCEPHIN)  IV 1 g (08/27/21 1942)   lactated ringers Stopped (08/21/21 1949)   PRN Meds:.acetaminophen **OR** acetaminophen, hydrocortisone-pramoxine    Subjective:   Brenden Manes was seen and examined  today.  Pt denies any new complaints.   Objective:   Vitals:   08/27/21 1953 08/27/21 2304 08/28/21 0321 08/28/21 0500  BP: 115/73 115/69 (!) 110/54   Pulse: 73 73 71   Resp: 15 18 14    Temp: (!) 97.5 F (36.4 C) 97.9 F (36.6 C) 98 F (36.7 C)   TempSrc: Oral Axillary Axillary   SpO2: 100% 100% 100%   Weight:    40 kg  Height:        Intake/Output Summary (Last 24 hours) at 08/28/2021 1054 Last data filed at 08/28/2021 0413 Gross per 24 hour  Intake 1092.65 ml  Output 850 ml  Net 242.65 ml   Filed Weights   08/19/21 1806 08/28/21 0500  Weight: 40.8 kg 40 kg     Exam General exam: cachetic looking gentleman.  Respiratory system: Clear to auscultation. Respiratory effort normal. Cardiovascular system: S1 & S2 heard, RRR. No JVD, No pedal edema. Gastrointestinal system: Abdomen is nondistended, soft and nontender.  Normal bowel  sounds heard. Central nervous system: Alert and oriented. No focal neurological deficits. Extremities: Symmetric 5 x 5 power. Skin: No rashes, lesions or ulcers Psychiatry: no agitation.   Data Reviewed:  I have personally reviewed following labs and imaging studies   CBC Lab Results  Component Value Date   WBC 9.2 08/27/2021   RBC 2.92 (L) 08/27/2021   HGB 8.3 (L) 08/27/2021   HCT 25.5 (L) 08/27/2021   MCV 87.3 08/27/2021   MCH 28.4 08/27/2021   PLT 301 08/27/2021   MCHC 32.5 08/27/2021   RDW 16.1 (H) 08/27/2021   LYMPHSABS 0.8 08/27/2021   MONOABS 0.8 08/27/2021   EOSABS 0.0 08/27/2021   BASOSABS 0.0 08/27/2021     Last metabolic panel Lab Results  Component Value Date   NA 131 (L) 08/27/2021   K 3.6 08/27/2021   CL 100 08/27/2021   CO2 22 08/27/2021   BUN 15 08/27/2021   CREATININE 0.81 08/27/2021   GLUCOSE 179 (H) 08/27/2021   GFRNONAA >60 08/27/2021   CALCIUM 8.1 (L) 08/27/2021   PHOS 3.1 08/27/2021   PROT 6.1 (L) 08/27/2021   ALBUMIN 2.2 (L) 08/27/2021   BILITOT 0.3 08/27/2021   ALKPHOS 112 08/27/2021   AST 24 08/27/2021   ALT 18 08/27/2021   ANIONGAP 9 08/27/2021    CBG (last 3)  Recent Labs    08/27/21 2315 08/28/21 0407 08/28/21 0852  GLUCAP 170* 122* 103*      Coagulation Profile: Recent Labs  Lab 08/24/21 0540  INR 1.0     Radiology Studies: No results found.     08/26/21 M.D. [name] 08/28/2021, 10:54 AM  Available via Epic secure chat 7am-7pm After 7 pm, please refer to night coverage provider listed on amion.

## 2021-08-29 DIAGNOSIS — E43 Unspecified severe protein-calorie malnutrition: Secondary | ICD-10-CM | POA: Diagnosis not present

## 2021-08-29 DIAGNOSIS — Z515 Encounter for palliative care: Secondary | ICD-10-CM | POA: Diagnosis not present

## 2021-08-29 DIAGNOSIS — D62 Acute posthemorrhagic anemia: Secondary | ICD-10-CM | POA: Diagnosis not present

## 2021-08-29 DIAGNOSIS — K625 Hemorrhage of anus and rectum: Secondary | ICD-10-CM | POA: Diagnosis not present

## 2021-08-29 DIAGNOSIS — Z7189 Other specified counseling: Secondary | ICD-10-CM | POA: Diagnosis not present

## 2021-08-29 DIAGNOSIS — K922 Gastrointestinal hemorrhage, unspecified: Secondary | ICD-10-CM | POA: Diagnosis not present

## 2021-08-29 LAB — BASIC METABOLIC PANEL
Anion gap: 11 (ref 5–15)
BUN: 5 mg/dL — ABNORMAL LOW (ref 6–20)
CO2: 22 mmol/L (ref 22–32)
Calcium: 8 mg/dL — ABNORMAL LOW (ref 8.9–10.3)
Chloride: 104 mmol/L (ref 98–111)
Creatinine, Ser: 0.49 mg/dL — ABNORMAL LOW (ref 0.61–1.24)
GFR, Estimated: >60 mL/min (ref >60)
Glucose, Bld: 196 mg/dL — ABNORMAL HIGH (ref 70–99)
Potassium: 3.3 mmol/L — ABNORMAL LOW (ref 3.5–5.1)
Sodium: 137 mmol/L (ref 135–145)

## 2021-08-29 LAB — URINE CULTURE: Culture: >=100000 — AB

## 2021-08-29 LAB — CBC WITH DIFFERENTIAL/PLATELET
Abs Immature Granulocytes: 0.02 10*3/uL (ref 0.00–0.07)
Basophils Absolute: 0 10*3/uL (ref 0.0–0.1)
Basophils Relative: 0 %
Eosinophils Absolute: 0 10*3/uL (ref 0.0–0.5)
Eosinophils Relative: 1 %
HCT: 22.2 % — ABNORMAL LOW (ref 39.0–52.0)
Hemoglobin: 7.6 g/dL — ABNORMAL LOW (ref 13.0–17.0)
Immature Granulocytes: 0 %
Lymphocytes Relative: 13 %
Lymphs Abs: 1 10*3/uL (ref 0.7–4.0)
MCH: 28.8 pg (ref 26.0–34.0)
MCHC: 34.2 g/dL (ref 30.0–36.0)
MCV: 84.1 fL (ref 80.0–100.0)
Monocytes Absolute: 0.7 10*3/uL (ref 0.1–1.0)
Monocytes Relative: 9 %
Neutro Abs: 5.4 10*3/uL (ref 1.7–7.7)
Neutrophils Relative %: 77 %
Platelets: 319 10*3/uL (ref 150–400)
RBC: 2.64 MIL/uL — ABNORMAL LOW (ref 4.22–5.81)
RDW: 15.9 % — ABNORMAL HIGH (ref 11.5–15.5)
WBC: 7.1 10*3/uL (ref 4.0–10.5)
nRBC: 0 % (ref 0.0–0.2)

## 2021-08-29 LAB — GLUCOSE, CAPILLARY
Glucose-Capillary: 186 mg/dL — ABNORMAL HIGH (ref 70–99)
Glucose-Capillary: 189 mg/dL — ABNORMAL HIGH (ref 70–99)
Glucose-Capillary: 196 mg/dL — ABNORMAL HIGH (ref 70–99)
Glucose-Capillary: 212 mg/dL — ABNORMAL HIGH (ref 70–99)
Glucose-Capillary: 233 mg/dL — ABNORMAL HIGH (ref 70–99)
Glucose-Capillary: 242 mg/dL — ABNORMAL HIGH (ref 70–99)
Glucose-Capillary: 257 mg/dL — ABNORMAL HIGH (ref 70–99)

## 2021-08-29 MED ORDER — POTASSIUM CHLORIDE CRYS ER 20 MEQ PO TBCR
40.0000 meq | EXTENDED_RELEASE_TABLET | Freq: Two times a day (BID) | ORAL | Status: AC
  Administered 2021-08-29 (×2): 40 meq via ORAL
  Filled 2021-08-29 (×2): qty 2

## 2021-08-29 NOTE — Progress Notes (Signed)
Physical Therapy Treatment Patient Details Name: Charles [name] MRN: [id number] DOB: 16-Jul-1960 Today's Date: 08/29/2021   History of Present Illness 61 y.o. male presented to ED 08/19/21 with rectal bleeding x1 week. Hypotensive. S/p EDG with no abnormalities (6/11). S/p colonoscopy showing hemorrhoid, polyp and diverticulosis (6/11). 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    PT Comments    Pt continues to be limited in ability to participate in therapy due to orthostatic hypotension (see below), and increased fatigue in presence of decreased strength and endurance. Although pt cognition has improved.  Pt able to sit on EoB long enough for BP measurement before complaints of extreme lightheadedness. Once lightheadedness resolved, pt able to participate in limited bed exercises. D/c plans continues to be appropriate. PT will continue to follow acutely.  Orthostatic BPs  Supine legs elevated  98/60 77  Supine flat bed 99/61 76  Supine HoB 50 degr4es 99/58 76  sitting 76/39 93  Return to Supine legs elevated 91/60 82      Recommendations for follow up therapy are one component of a multi-disciplinary discharge planning process, led by the attending physician.  Recommendations may be updated based on patient status, additional functional criteria and insurance authorization.  Follow Up Recommendations  Skilled nursing-short term rehab (<3 hours/day)     Assistance Recommended at Discharge Frequent or constant Supervision/Assistance  Patient can return home with the following Two people to help with walking and/or transfers;Two people to help with bathing/dressing/bathroom;Assistance with cooking/housework;Direct supervision/assist for medications management;Direct supervision/assist for  financial management;Assist for transportation;Help with stairs or ramp for entrance   Equipment Recommendations  None recommended by PT (TBD)       Precautions / Restrictions Precautions Precautions: Fall Precaution Comments: monitor orthostatics, abdominal binder and ace wraps for LE in room to don prior to transfers Restrictions Weight Bearing Restrictions: No     Mobility  Bed Mobility Overal bed mobility: Needs Assistance   Rolling: Total assist   Supine to sit: Max assist, HOB elevated Sit to supine: Total assist   General bed mobility comments: maxA and maximal cuing for sequencing to come to sit EoB    Transfers                   General transfer comment: defer, pt orthostatic with HOB at 60* and feet down and not following commands to sit EOB (curls into fetal position)                    Balance   Sitting-balance support: Bilateral upper extremity supported, Single extremity supported, Feet supported Sitting balance-Leahy Scale: Poor Sitting balance - Comments: does not need outside support to maintain sitting today, provided assist when he became lightheaded                                    Cognition Arousal/Alertness: Awake/alert Behavior During Therapy: Flat affect Overall Cognitive Status: No family/caregiver present to determine baseline cognitive functioning Area of Impairment: Memory, Following commands, Safety/judgement, Awareness, Problem solving, Attention, Orientation                 Orientation Level: Disoriented to, Time Current Attention Level: Focused, Divided Memory: Decreased short-term memory Following Commands: Follows one step commands consistently, Follows multi-step commands with increased time Safety/Judgement:  Decreased awareness of deficits, Decreased awareness of safety Awareness: Intellectual Problem Solving: Slow processing, Requires verbal cues, Requires tactile cues, Decreased initiation,  Difficulty sequencing General Comments: slowed processing and decreased awareness of his deficits        Exercises General Exercises - Lower Extremity Ankle Circles/Pumps: Both, 10 reps, Supine, AROM Long Arc Quad: AROM, Both, 10 reps, Supine    General Comments General comments (skin integrity, edema, etc.): SpO2 >93%O2 on RA      Pertinent Vitals/Pain Pain Assessment Pain Assessment: Faces Faces Pain Scale: Hurts little more Pain Location: sacrum with moving to EoB Pain Descriptors / Indicators: Guarding, Grimacing     PT Goals (current goals can now be found in the care plan section) Acute Rehab PT Goals PT Goal Formulation: With patient Time For Goal Achievement: 09/03/21 Potential to Achieve Goals: Fair Progress towards PT goals: Progressing toward goals    Frequency    Min 3X/week      PT Plan Current plan remains appropriate       AM-PAC PT "6 Clicks" Mobility   Outcome Measure  Help needed turning from your back to your side while in a flat bed without using bedrails?: A Lot Help needed moving from lying on your back to sitting on the side of a flat bed without using bedrails?: A Lot Help needed moving to and from a bed to a chair (including a wheelchair)?: Total Help needed standing up from a chair using your arms (e.g., wheelchair or bedside chair)?: Total Help needed to walk in hospital room?: Total Help needed climbing 3-5 steps with a railing? : Total 6 Click Score: 8    End of Session Equipment Utilized During Treatment: Gait belt Activity Tolerance: Treatment limited secondary to medical complications (Comment);Other (comment);Patient limited by fatigue (continues to be limited by orthostatic hypotension) Patient left: in bed;with call bell/phone within reach;with bed alarm set Nurse Communication: Mobility status;Need for lift equipment;Precautions;Other (comment) (ace wraps OK to doff when not actively working with therapies, use figure 8  pattern to wrap for pt safety (written on white board)) PT Visit Diagnosis: Muscle weakness (generalized) (M62.81);Difficulty in walking, not elsewhere classified (R26.2);Adult, failure to thrive (R62.7)     Time: 2683-4196 PT Time Calculation (min) (ACUTE ONLY): 29 min  Charges:  $Therapeutic Exercise: 8-22 mins $Therapeutic Activity: 8-22 mins                     Taydem Cavagnaro B. Beverely Risen PT, DPT Acute Rehabilitation Services Please use secure chat or  Call Office [phone]    Elon Alas [facility] 08/29/2021, 12:29 PM

## 2021-08-29 NOTE — Progress Notes (Signed)
[name]                                                                               Charles [name], is a 61 y.o. male, DOB - [birth year], SHF:[id number] Admit date - 08/19/2021    Outpatient Primary MD for the patient is [name], [name], MD  LOS - 10  days    Brief summary   Charles [name] is a 61 y.o. 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 He also had over 60 pound weight loss in 6 months.  GI consulted.   Next day, Hgb down to 7.7.  Reportedly had a large stool with dark blood.  He was transfused 1 unit with appropriate response.  EGD on 6/11 basically normal.  Colonoscopy showed hemorrhoid, polyp and diverticulosis in ascending colon and solitary rectal ulcer felt to be due to constipation.   H&H has been stable.  Severe orthostatic hypotension with SBP dropping from 120s to 60s just with sitting in bed.  Now with encephalopathy in the setting of Candida associated UTI.  Blood cultures NGTD. Urine cultures pending.  On IV rocephin.  Therapy eval recommending SNF.  Palliative care consulted.   Assessment & Plan    Assessment and Plan:  Symptomatic ABLA due to acute lower GI bleed:  - Hematochezia for one week.  - 60 lbs weight loss in 6 months.  - CT abd and pelvis without significant finding.  -EGD is within normal limits Colonoscopy showed hemorrhoids, polyps, diverticulosis in the ascending colon and solitary rectal ulcer secondary to constipation. Biopsy of the polyps revealed benign in nature recommended colonoscopy in 7 years. Transfuse to keep hemoglobin greater than 8 MiraLAX every other day Continue holding aspirin recommend restarting it in 1 week.    Acute metabolic encephalopathy  delirium with underlying cognitive impairment Probably secondary to urinary tract infection/underlying cognitive decline, severe malnutrition and hospital-acquired delirium Neurology consulted recommended outpatient follow-up with Dr. Marjory Lies at [facility] neurology Try to minimize deliriogenic medications, benzodiazepines and anticholinergic medications. No focal deficits to suggest CVA. Completed high-dose thiamine without significant change. Ammonia and B12 levels within normal limits. Patient today is alert and oriented to person and place. Currently waiting for SNF placement.    Diverticulosis/polyps of the ascending colon/solitary rectal ulcer/hemorrhoids MiraLAX every other day and Anusol cream.   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 Patient failed voiding trial on August 26, 2021. Replaced Foley catheter Continue with IV Rocephin for another 24 hours .  Urine cultures show E coli     Uncontrolled insulin-dependent diabetes mellitus with hyperglycemia and hypoglycemia Hemoglobin A1c is around 10.4 in April 2023 Continue with sliding scale insulin and low-dose Semglee. CBG (last 3)  Recent Labs    08/29/21 0854 08/29/21 1142 08/29/21 1547  GLUCAP 186* 257* 212*       Hyperlipidemia Continue with Crestor.    History of coronary artery disease s/p CABG in the past Patient currently denies any  chest pain at this time Continue home medications except for aspirin.   Orthostatic hypotension Probably secondary to autonomic dysregulation and severe malnutrition Continue with compression stockings and abdominal binder along with midodrine.   BPH with urinary retention and chronic Foley placement Recommend outpatient follow-up with urology. Complete the course of antibiotics for UTI. Discontinued Flomax given significant orthostatic hypotension.    Hypokalemia and hypomagnesemia Replaced. Recheck in am.      GERD EGD within normal limits and continue with PPI   Severe malnutrition with unintentional weight loss/failure to thrive Patient appears to have lost weight in the last 6 months with significant muscle mass and subcutaneous fat loss. HIV is nonreactive, no explanation on EGD, colonoscopy, CT abdomen and pelvis. TSH is within normal limits Probably secondary to chronic illness from coronary artery disease cognitive decline diabetes mellitus poor oral intake.  Dietary consulted and recommendation given for supplementations.    Physical deconditioning, cognitive decline poor prognosis Palliative care consulted for goals of care discussions.   Pressure Injury 07/02/21 Buttocks Right Stage 2 -  Partial thickness loss of dermis presenting as a shallow open injury with a red, pink wound bed without slough. (Active)  07/02/21 0000  Location: Buttocks  Location Orientation: Right  Staging: Stage 2 -  Partial thickness loss of dermis presenting as a shallow open injury with a red, pink wound bed without slough.  Wound Description (Comments):   Present on Admission:    Wound care consulted.   Malnutrition Type:  Nutrition Problem: Severe Malnutrition Etiology: chronic illness (diabetes, CAD, cognitive decline)   Malnutrition Characteristics:  Signs/Symptoms: severe fat depletion, severe muscle depletion, percent weight loss Percent weight loss: 37.5 %   Nutrition Interventions:  Interventions: MVI, Snacks, Ensure Enlive (each supplement provides 350kcal and 20 grams of protein)  Estimated body mass index is 34.16 kg/m as calculated from the following:   Height as of this encounter: 5\' 6"  (1.676 m).   Weight as of this encounter: 96 kg.  Code Status: full code.  DVT Prophylaxis:  SCDs Start: 08/19/21 2220   Level of Care: Level of care: Telemetry Medical Family Communication: none at bedside.   Disposition Plan:     Remains inpatient appropriate:  SNF placement.    Procedures:  None.   Consultants:   Palliative care.  Neurology.   Antimicrobials:   Anti-infectives (From admission, onward)    Start     Dose/Rate Route Frequency Ordered Stop   08/26/21 1915  cefTRIAXone (ROCEPHIN) 1 g in sodium chloride 0.9 % 100 mL IVPB        1 g 200 mL/hr over 30 Minutes Intravenous Every 24 hours 08/26/21 1821          Medications  Scheduled Meds:  Chlorhexidine Gluconate Cloth  6 each Topical Daily   docusate sodium  100 mg Oral Daily   dronabinol  2.5 mg Oral BID AC   feeding supplement  237 mL Oral TID BM   insulin aspart  0-9 Units Subcutaneous Q4H   insulin glargine-yfgn  5 Units Subcutaneous Daily   mouth rinse  15 mL Mouth Rinse BID   midodrine  10 mg Oral TID WC   multivitamin with minerals  1 tablet Oral Daily   pantoprazole  40 mg Oral Daily   polyethylene glycol  17 g Oral QODAY   rosuvastatin  10 mg Oral Daily   thiamine  100 mg Oral Daily   Continuous Infusions:  sodium chloride 60 mL/hr at 08/29/21 [phone]  cefTRIAXone (ROCEPHIN)  IV Stopped (08/28/21 2026)   lactated ringers Stopped (08/21/21 1949)   PRN Meds:.acetaminophen **OR** acetaminophen, hydrocortisone-pramoxine    Subjective:   Charles [name] was seen and examined today.  Pt denies any new complaints.   Objective:   Vitals:   08/29/21 0000 08/29/21 0400 08/29/21 0444 08/29/21 0850  BP: 132/72 114/72  107/63  Pulse: 71 72  74  Resp: 14 17  19   Temp: 97.6 F (36.4 C) 98.7 F (37.1 C)  98.6 F (37 C)  TempSrc: Oral Oral  Oral  SpO2: 100% 100%  100%  Weight:   96 kg   Height:        Intake/Output Summary (Last 24 hours) at 08/29/2021 0904 Last data filed at 08/29/2021 0616 Gross per 24 hour  Intake 1541.55 ml  Output 1300 ml  Net 241.55 ml    Filed Weights   08/19/21 1806 08/28/21 0500 08/29/21 0444  Weight: 40.8 kg 40 kg 96 kg     Exam General exam: Appears calm and comfortable  Respiratory system: Clear to auscultation. Respiratory effort  normal. Cardiovascular system: S1 & S2 heard, RRR. No JVD, No pedal edema. Gastrointestinal system: Abdomen is nondistended, soft and nontender. Normal bowel sounds heard. Central nervous system: Alert and oriented. No focal neurological deficits. Extremities: Symmetric 5 x 5 power. Skin: No rashes, lesions or ulcers Psychiatry: Mood & affect appropriate.   Data Reviewed:  I have personally reviewed following labs and imaging studies   CBC Lab Results  Component Value Date   WBC 9.2 08/27/2021   RBC 2.92 (L) 08/27/2021   HGB 8.3 (L) 08/27/2021   HCT 25.5 (L) 08/27/2021   MCV 87.3 08/27/2021   MCH 28.4 08/27/2021   PLT 301 08/27/2021   MCHC 32.5 08/27/2021   RDW 16.1 (H) 08/27/2021   LYMPHSABS 0.8 08/27/2021   MONOABS 0.8 08/27/2021   EOSABS 0.0 08/27/2021   BASOSABS 0.0 [id number]     Last metabolic panel Lab Results  Component Value Date   NA 131 (L) 08/27/2021   K 3.6 08/27/2021   CL 100 08/27/2021   CO2 22 08/27/2021   BUN 15 08/27/2021   CREATININE 0.81 08/27/2021   GLUCOSE 179 (H) 08/27/2021   GFRNONAA >60 08/27/2021   CALCIUM 8.1 (L) 08/27/2021   PHOS 3.1 08/27/2021   PROT 6.1 (L) 08/27/2021   ALBUMIN 2.2 (L) 08/27/2021   BILITOT 0.3 08/27/2021   ALKPHOS 112 08/27/2021   AST 24 08/27/2021   ALT 18 08/27/2021   ANIONGAP 9 08/27/2021    CBG (last 3)  Recent Labs    08/29/21 0013 08/29/21 0400 08/29/21 0854  GLUCAP 189* 196* 186*       Coagulation Profile: Recent Labs  Lab 08/24/21 0540  INR 1.0      Radiology Studies: No results found.     Hosie Poisson M.D. [name] 08/29/2021, 9:04 AM  Available via Epic secure chat 7am-7pm After 7 pm, please refer to night coverage provider listed on amion.

## 2021-08-29 NOTE — Progress Notes (Incomplete)
  Daily Progress Note   Patient Name: Charles [name]       Date: 08/29/2021 DOB: 1961-01-16  Age: 61 y.o. MRN#: [id number] Attending Physician: Kathlen Mody, MD Primary Care Physician: Pearline Cables, MD Admit Date: 08/19/2021 Length of Stay: 10 days  Reason for Consultation/Follow-up: {Reason for Consult:23484}  HPI/Patient Profile:  ***  Subjective:   Subjective: Chart Reviewed. Updates received. Patient Assessed. Created space and opportunity for patient  and family to explore thoughts and feelings regarding current medical situation.  Today's Discussion: ***  Review of Systems  Objective:   Vital Signs:  BP (!) 99/58 (BP Location: Right Arm)   Pulse 74   Temp 98.2 F (36.8 C) (Oral)   Resp 19   Ht 5\' 6"  (1.676 m)   Wt 96 kg   SpO2 100%   BMI 34.16 kg/m   Physical Exam: Physical Exam  Palliative Assessment/Data: ***   Assessment & Plan:   Impression: Present on Admission:  Acute lower GI bleeding  HLD (hyperlipidemia)  ***  SUMMARY OF RECOMMENDATIONS   ***  Symptom Management:  ***  Code Status: {Palliative Code status:23503}  Prognosis: {Palliative Care Prognosis:23504}  Discharge Planning: {Palliative dispostion:23505}  Discussed with: ***  Thank you for allowing to participate in the care of Charles [name] PMT will continue to support holistically.  Time Total: ***  Visit consisted of counseling and education dealing with the complex and emotionally intense issues of symptom management and palliative care in the setting of serious and potentially life-threatening illness. Greater than 50%  of this time was spent counseling and coordinating care related to the above assessment and plan.  Holley Dexter, NP Palliative Medicine Team  Team Phone # [phone] (Nights/Weekends)  11/09/2020, 8:17 AM

## 2021-08-29 NOTE — Progress Notes (Signed)
Nutrition Follow-up  DOCUMENTATION CODES:   Severe malnutrition in context of chronic illness, Underweight  INTERVENTION:   Continue Ensure Enlive po BID, each supplement provides 350 kcal and 20 grams of protein. Continue Multivitamin w/ minerals daily Discontinue snacks Discontinue calorie count Feeding assist with all meals  NUTRITION DIAGNOSIS:   Severe Malnutrition related to chronic illness (diabetes, CAD, cognitive decline) as evidenced by severe fat depletion, severe muscle depletion, percent weight loss. - Ongoing   GOAL:   Patient will meet greater than or equal to 90% of their needs - Ongoing   MONITOR:   PO intake, Supplement acceptance, Labs, Weight trends  REASON FOR ASSESSMENT:   Consult Assessment of nutrition requirement/status  ASSESSMENT:   Pt admitted with rectal bleeding, found to have acute lower GIB. PMH significant for diabetes, hypotension, CAD s/p CABG, BPH, HLD, GERD, and cognitive decline/?dementia.  Calorie count was started on 6/17; RD to follow up with results today. No information was found in pt room/door. This RD reached out to RN, RN reports that nothing was kept. Pt was eating very little over the weekend, but continues to drink at least 3 Ensures per day.   Pt reports that he is doing ok, drinking an Ensure at time of RD visit. Denies any nausea or vomiting.  Suspect that weight was put into the chart incorrectly. Reached out to RN to notified and recommend obtaining new weight.   Plan for family meeting today to determine GOC.   Medications reviewed and include: Colace, Marinol, SSI, Semglee, MVI, Protonix, Miralax, Potassium Chloride, Thiamine, IV antibiotics  Labs reviewed: Potassium 3.3, BUN <5, Creatinine 0.49, 24 hr CBG 186-317  Diet Order:   Diet Order             DIET DYS 3 Room service appropriate? Yes with Assist; Fluid consistency: Thin  Diet effective now                  EDUCATION NEEDS:   No education needs  have been identified at this time  Skin:  Skin Assessment: Reviewed RN Assessment Skin Integrity Issues:: Stage II Stage II: R buttock  Last BM:  6/18  Height:   Ht Readings from Last 1 Encounters:  08/19/21 5\' 6"  (1.676 m)   Weight:   Wt Readings from Last 1 Encounters:  08/29/21 96 kg   BMI:  Body mass index is 34.16 kg/m.  Estimated Nutritional Needs:  Kcal:  1400-1600 Protein:  70-85g Fluid:  >/=1.4L    08/31/21 RD, LDN Clinical Dietitian See [facility] for contact information.

## 2021-08-29 NOTE — Consult Note (Addendum)
WOC Nurse Consult Note: Reason for Consult: Consult requested for sacrum/buttocks.  Pt is very emaciated with protruding sacrum bone.  He is frequently incontinent of loose stools and it is difficult to keep the affected area from becoming soiled.  Wound type: Inner gluteal fold with patchy area of partial thickness fissure;  2X.2X.1cm, pink and moist. Not a pressure injury Pressure Injury POA: Yes Dressing procedure/placement/frequency: Topical treatment orders provided for bedside nurses to perform as follows to protect and promote healing: Foam dressing to sacrum/buttocks, change Q 3 days or PRN soiling. Please re-consult if further assistance is needed.  Thank-you,  Cammie Mcgee MSN, RN, CWOCN, [place], CNS [phone]

## 2021-08-30 DIAGNOSIS — G9341 Metabolic encephalopathy: Secondary | ICD-10-CM | POA: Diagnosis not present

## 2021-08-30 DIAGNOSIS — K922 Gastrointestinal hemorrhage, unspecified: Secondary | ICD-10-CM | POA: Diagnosis not present

## 2021-08-30 DIAGNOSIS — N401 Enlarged prostate with lower urinary tract symptoms: Secondary | ICD-10-CM | POA: Diagnosis not present

## 2021-08-30 DIAGNOSIS — D62 Acute posthemorrhagic anemia: Secondary | ICD-10-CM | POA: Diagnosis not present

## 2021-08-30 LAB — BASIC METABOLIC PANEL
Anion gap: 8 (ref 5–15)
BUN: 7 mg/dL (ref 6–20)
CO2: 23 mmol/L (ref 22–32)
Calcium: 8.3 mg/dL — ABNORMAL LOW (ref 8.9–10.3)
Chloride: 106 mmol/L (ref 98–111)
Creatinine, Ser: 0.5 mg/dL — ABNORMAL LOW (ref 0.61–1.24)
GFR, Estimated: >60 mL/min (ref >60)
Glucose, Bld: 280 mg/dL — ABNORMAL HIGH (ref 70–99)
Potassium: 4.3 mmol/L (ref 3.5–5.1)
Sodium: 137 mmol/L (ref 135–145)

## 2021-08-30 LAB — GLUCOSE, CAPILLARY
Glucose-Capillary: 147 mg/dL — ABNORMAL HIGH (ref 70–99)
Glucose-Capillary: 130 mg/dL — ABNORMAL HIGH (ref 70–99)
Glucose-Capillary: 266 mg/dL — ABNORMAL HIGH (ref 70–99)
Glucose-Capillary: 243 mg/dL — ABNORMAL HIGH (ref 70–99)
Glucose-Capillary: 271 mg/dL — ABNORMAL HIGH (ref 70–99)

## 2021-08-30 LAB — HEMOGLOBIN AND HEMATOCRIT, BLOOD
HCT: 26.5 % — ABNORMAL LOW (ref 39.0–52.0)
Hemoglobin: 8.6 g/dL — ABNORMAL LOW (ref 13.0–17.0)

## 2021-08-30 NOTE — Progress Notes (Signed)
[name]                                                                               Charles [name], is a 61 y.o. male, DOB - 09/24/1960, TA:[id number] Admit date - 08/19/2021    Outpatient Primary MD for the patient is [name], [name], MD  LOS - 11  days    Brief summary   Charles [name] is a 61 y.o. 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 He also had over 60 pound weight loss in 6 months.  GI consulted.   Next day, Hgb down to 7.7.  Reportedly had a large stool with dark blood.  He was transfused 1 unit with appropriate response.  EGD on 6/11 basically normal.  Colonoscopy showed hemorrhoid, polyp and diverticulosis in ascending colon and solitary rectal ulcer felt to be due to constipation.   H&H has been stable.  Severe orthostatic hypotension with SBP dropping from 120s to 60s just with sitting in bed.  Now with encephalopathy in the setting of Candida associated UTI.  Blood cultures NGTD. Urine cultures growing E coli . On IV rocephin.  Therapy eval recommending SNF.  Palliative care consulted.   Assessment & Plan    Assessment and Plan:  Symptomatic ABLA due to acute lower GI bleed:  - Hematochezia for one week.  - 60 lbs weight loss in 6 months.  - CT abd and pelvis without significant finding.  -EGD is within normal limits Colonoscopy showed hemorrhoids, polyps, diverticulosis in the ascending colon and solitary rectal ulcer secondary to constipation. Biopsy of the polyps revealed benign in nature recommended colonoscopy in 7 years. Transfuse to keep hemoglobin greater than 8 MiraLAX every other day Continue holding aspirin,  recommend restarting it in 1 week.    Acute metabolic encephalopathy   with delirium with underlying cognitive impairment Probably secondary to urinary tract infection/underlying cognitive decline, severe malnutrition and hospital-acquired delirium Neurology consulted recommended outpatient follow-up with Dr. Leta Baptist at [facility] neurology Try to minimize deliriogenic medications, benzodiazepines and anticholinergic medications. No focal deficits to suggest CVA. Completed high-dose thiamine without significant change. Ammonia and B12 levels within normal limits. Patient today is alert and oriented to person and place. Discussed the plan with the patient and sister at bedside.  Currently waiting for SNF placement.    Diverticulosis/polyps of the ascending colon/solitary rectal ulcer/hemorrhoids MiraLAX every other day and Anusol cream.   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 Patient failed voiding trial on August 26, 2021. Replaced Foley catheter Complete the course of IV rocephin for 5 days. D/c IV rocephin after tonight's dose.  Urine cultures show E coli .    Uncontrolled insulin-dependent diabetes mellitus with hyperglycemia and hypoglycemia Hemoglobin A1c is around 10.4 in April 2023 Continue with sliding scale insulin and low-dose Semglee. CBG (last 3)  Recent Labs    08/30/21 0405 08/30/21 0739 08/30/21 1146  GLUCAP 147* 130* 266*  Hyperlipidemia Continue with Crestor.    History of coronary artery disease s/p CABG  Patient currently denies any chest pain at this time Continue home medications except for aspirin.   Orthostatic hypotension Probably secondary to autonomic dysregulation and severe malnutrition Continue with compression stockings and abdominal binder along with midodrine. BP parameters are optimal.    BPH with urinary retention and chronic Foley placement Recommend outpatient follow-up with urology. Complete the course of antibiotics for  UTI. Discontinued Flomax given significant orthostatic hypotension.    Hypokalemia and hypomagnesemia Replaced. Repeat levels wnl.     GERD EGD within normal limits  and continue with PPI   Severe malnutrition with unintentional weight loss/failure to thrive Patient appears to have lost weight in the last 6 months with significant muscle mass and subcutaneous fat loss. HIV is nonreactive, no explanation on EGD, colonoscopy, CT abdomen and pelvis. TSH is within normal limits Probably secondary to chronic illness from coronary artery disease cognitive decline diabetes mellitus poor oral intake.  Dietary consulted and recommendation given for supplementations.    Physical deconditioning, cognitive decline poor prognosis Palliative care consulted for goals of care discussions.   Pressure Injury 07/02/21 Buttocks Right Stage 2 -  Partial thickness loss of dermis presenting as a shallow open injury with a red, pink wound bed without slough. this is a partial thickness fissure in the gluteal cleft, NOT a pressure injury (Active)  07/02/21 0000  Location: Buttocks  Location Orientation: Right  Staging: Stage 2 -  Partial thickness loss of dermis presenting as a shallow open injury with a red, pink wound bed without slough.  Wound Description (Comments): this is a partial thickness fissure in the gluteal cleft, NOT a pressure injury  Present on Admission: Yes   Wound care consulted.  Present on admission.   Malnutrition Type:  Nutrition Problem: Severe Malnutrition Etiology: chronic illness (diabetes, CAD, cognitive decline)   Malnutrition Characteristics:  Signs/Symptoms: severe fat depletion, severe muscle depletion, percent weight loss Percent weight loss: 37.5 %   Nutrition Interventions:  Interventions: Ensure Enlive (each supplement provides 350kcal and 20 grams of protein), MVI, Refer to RD note for recommendations  Estimated body mass index is 17.4 kg/m as  calculated from the following:   Height as of this encounter: 5\' 6"  (1.676 m).   Weight as of this encounter: 48.9 kg.  Code Status: full code.  DVT Prophylaxis:  SCDs Start: 08/19/21 2220   Level of Care: Level of care: Telemetry Medical Family Communication: none at bedside.   Disposition Plan:     Remains inpatient appropriate:  SNF placement tomorrow.  Procedures:  None.   Consultants:   Palliative care.  Neurology.  Wound care   Antimicrobials:   Anti-infectives (From admission, onward)    Start     Dose/Rate Route Frequency Ordered Stop   08/26/21 1915  cefTRIAXone (ROCEPHIN) 1 g in sodium chloride 0.9 % 100 mL IVPB        1 g 200 mL/hr over 30 Minutes Intravenous Every 24 hours 08/26/21 1821          Medications  Scheduled Meds:  Chlorhexidine Gluconate Cloth  6 each Topical Daily   docusate sodium  100 mg Oral Daily   dronabinol  2.5 mg Oral BID AC   feeding supplement  237 mL Oral TID BM   insulin aspart  0-9 Units Subcutaneous Q4H   insulin glargine-yfgn  5 Units Subcutaneous Daily   mouth rinse  15 mL Mouth Rinse BID  midodrine  10 mg Oral TID WC   multivitamin with minerals  1 tablet Oral Daily   pantoprazole  40 mg Oral Daily   polyethylene glycol  17 g Oral QODAY   rosuvastatin  10 mg Oral Daily   thiamine  100 mg Oral Daily   Continuous Infusions:  sodium chloride 60 mL/hr at 08/29/21 2251   cefTRIAXone (ROCEPHIN)  IV 1 g (08/29/21 2119)   lactated ringers Stopped (08/21/21 1949)   PRN Meds:.acetaminophen **OR** acetaminophen, hydrocortisone-pramoxine    Subjective:   Glover Capano was seen and examined today.  Pt denies any new complaints.   Objective:   Vitals:   08/30/21 0410 08/30/21 0455 08/30/21 0737 08/30/21 1144  BP: 131/76  105/64 111/61  Pulse: 84  73 77  Resp: (!) 24  15 13   Temp: 98 F (36.7 C)  97.7 F (36.5 C) 97.6 F (36.4 C)  TempSrc: Oral  Oral Oral  SpO2: 100%  100% 100%  Weight:  48.9 kg    Height:         Intake/Output Summary (Last 24 hours) at 08/30/2021 1339 Last data filed at 08/30/2021 0600 Gross per 24 hour  Intake 1239.84 ml  Output 1500 ml  Net -260.16 ml    Filed Weights   08/28/21 0500 08/29/21 0444 08/30/21 0455  Weight: 40 kg 96 kg 48.9 kg     Exam General exam: cachetic looking gentleman, not in distress.  Respiratory system: Clear to auscultation. Respiratory effort normal. Cardiovascular system: S1 & S2 heard, RRR. No JVD,  No pedal edema. Gastrointestinal system: Abdomen is nondistended, soft and nontender. Normal bowel sounds heard. Central nervous system: Alert and oriented. No focal neurological deficits. Extremities: Symmetric 5 x 5 power. Skin: No rashes, lesions or ulcers Psychiatry: Mood & affect appropriate.    Data Reviewed:  I have personally reviewed following labs and imaging studies   CBC Lab Results  Component Value Date   WBC 7.1 08/29/2021   RBC 2.64 (L) 08/29/2021   HGB 7.6 (L) 08/29/2021   HCT 22.2 (L) 08/29/2021   MCV 84.1 08/29/2021   MCH 28.8 08/29/2021   PLT 319 08/29/2021   MCHC 34.2 08/29/2021   RDW 15.9 (H) 08/29/2021   LYMPHSABS 1.0 08/29/2021   MONOABS 0.7 08/29/2021   EOSABS 0.0 08/29/2021   BASOSABS 0.0 08/29/2021     Last metabolic panel Lab Results  Component Value Date   NA 137 08/30/2021   K 4.3 08/30/2021   CL 106 08/30/2021   CO2 23 08/30/2021   BUN 7 08/30/2021   CREATININE 0.50 (L) 08/30/2021   GLUCOSE 280 (H) 08/30/2021   GFRNONAA >60 08/30/2021   CALCIUM 8.3 (L) 08/30/2021   PHOS 3.1 08/27/2021   PROT 6.1 (L) 08/27/2021   ALBUMIN 2.2 (L) 08/27/2021   BILITOT 0.3 08/27/2021   ALKPHOS 112 08/27/2021   AST 24 08/27/2021   ALT 18 08/27/2021   ANIONGAP 8 08/30/2021    CBG (last 3)  Recent Labs    08/30/21 0405 08/30/21 0739 08/30/21 1146  GLUCAP 147* 130* 266*       Coagulation Profile: Recent Labs  Lab 08/24/21 0540  INR 1.0      Radiology Studies: No results  found.     08/26/21 M.D. [name] 08/30/2021, 1:39 PM  Available via Epic secure chat 7am-7pm After 7 pm, please refer to night coverage provider listed on amion.

## 2021-08-30 NOTE — Progress Notes (Signed)
Pt sister Maxine Glenn and notary at bedside filling out paperwork with pt. Lab attempted to collect blood specimen, both notary and sister asked for lab to come at a later time. Lab to collect specimen when granted time to return back to pt.

## 2021-08-30 NOTE — Progress Notes (Signed)
Physical Therapy Treatment Patient Details Name: Charles [name] MRN: [id number] DOB: 05/22/1960 Today's Date: 08/30/2021   History of Present Illness 61 y.o. male presented to ED 08/19/21 with rectal bleeding x1 week. Hypotensive. S/p EDG with no abnormalities (6/11). S/p colonoscopy showing hemorrhoid, polyp and diverticulosis (6/11). 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    PT Comments    Patient received in bed, he is agreeable to PT session. Patient reports mild dizziness this session. Required mod assist for supine to sit. Some dizziness with sitting up and BP drop. Patient able to tolerate this position better this session. Fatigued with sitting up. He is able to transfer to recliner with max assist and an additional sit to stand x1 for cleaning up. Patient will continue to benefit from skilled PT while here to improve strength and functional independence.      Recommendations for follow up therapy are one component of a multi-disciplinary discharge planning process, led by the attending physician.  Recommendations may be updated based on patient status, additional functional criteria and insurance authorization.  Follow Up Recommendations  Skilled nursing-short term rehab (<3 hours/day)     Assistance Recommended at Discharge Frequent or constant Supervision/Assistance  Patient can return home with the following Two people to help with walking and/or transfers;A lot of help with bathing/dressing/bathroom;Assist for transportation;Help with stairs or ramp for entrance   Equipment Recommendations  Other (comment) (TBD)    Recommendations for Other Services       Precautions / Restrictions Precautions Precautions: Fall Precaution Comments: monitor orthostatics, abdominal binder and  ace wraps for LE in room to don prior to transfers Restrictions Weight Bearing Restrictions: No     Mobility  Bed Mobility Overal bed mobility: Needs Assistance Bed Mobility: Supine to Sit     Supine to sit: Mod assist, HOB elevated     General bed mobility comments: mod assist to raise trunk to seated position. Able to maintain seated position briefly, but then lies back due to fatigue.    Transfers Overall transfer level: Needs assistance Equipment used: None Transfers: Sit to/from Stand, Bed to chair/wheelchair/BSC Sit to Stand: Max assist Stand pivot transfers: Max assist         General transfer comment: Patient reporting mild dizziness with sitting this session. Able to progress to transfer to recliner. When seated in recliner BP 98/66. Patient asymptomatic.    Ambulation/Gait               General Gait Details: unable   Stairs             Wheelchair Mobility    Modified Rankin (Stroke Patients Only)       Balance Overall balance assessment: Needs assistance Sitting-balance support: Feet supported Sitting balance-Leahy Scale: Poor Sitting balance - Comments: min guard support to maintain sitting. Fatigue limiting ability to maintain sitting, not reports of dizziness Postural control: Posterior lean   Standing balance-Leahy Scale: Zero                              Cognition Arousal/Alertness: Awake/alert Behavior During Therapy: WFL for tasks assessed/performed Overall Cognitive Status: Within Functional Limits for tasks assessed Area of Impairment: Following commands, Safety/judgement, Awareness, Problem solving  Following Commands: Follows one step commands consistently, Follows multi-step commands with increased time     Problem Solving: Slow processing, Requires verbal cues, Requires tactile cues, Decreased initiation, Difficulty sequencing General Comments: slowed processing and decreased  awareness of his deficits        Exercises      General Comments        Pertinent Vitals/Pain Pain Assessment Pain Assessment: No/denies pain    Home Living                          Prior Function            PT Goals (current goals can now be found in the care plan section) Acute Rehab PT Goals Patient Stated Goal: get stronger PT Goal Formulation: With patient Time For Goal Achievement: 09/09/21 Potential to Achieve Goals: Fair Progress towards PT goals: Progressing toward goals    Frequency    Min 3X/week      PT Plan Current plan remains appropriate    Co-evaluation              AM-PAC PT "6 Clicks" Mobility   Outcome Measure  Help needed turning from your back to your side while in a flat bed without using bedrails?: A Little Help needed moving from lying on your back to sitting on the side of a flat bed without using bedrails?: A Lot Help needed moving to and from a bed to a chair (including a wheelchair)?: A Lot Help needed standing up from a chair using your arms (e.g., wheelchair or bedside chair)?: A Lot Help needed to walk in hospital room?: Total Help needed climbing 3-5 steps with a railing? : Total 6 Click Score: 11    End of Session Equipment Utilized During Treatment: Gait belt Activity Tolerance: Patient tolerated treatment well Patient left: in chair;with call bell/phone within reach;with chair alarm set Nurse Communication: Mobility status PT Visit Diagnosis: Muscle weakness (generalized) (M62.81);Difficulty in walking, not elsewhere classified (R26.2);Adult, failure to thrive (R62.7);Other abnormalities of gait and mobility (R26.89)     Time: 3419-3790 PT Time Calculation (min) (ACUTE ONLY): 29 min  Charges:  $Therapeutic Activity: 23-37 mins                     Maeleigh Buschman, PT, GCS 08/30/21,2:20 PM

## 2021-08-30 NOTE — TOC Progression Note (Signed)
Transition of Care [facility]) - Progression Note    Patient Details  Name: Charles [name] MRN: [id number] Date of Birth: 08-Apr-1960  Transition of Care [facility]) CM/SW Contact  Mearl Latin, LCSW Phone Number: 08/30/2021, 1:45 PM  Clinical Narrative:    CSW printed paperwork as requested by [facility] [address] and provided it to her (regarding Will info). CSW updated Rosann Auerbach that patient will be discharging tomorrow and they have changed review date to July 5th. CSW made [facility] aware.    Expected Discharge Plan: Skilled Nursing Facility Barriers to Discharge: English as a second language teacher, Continued Medical Work up  Expected Discharge Plan and Services Expected Discharge Plan: Skilled Nursing Facility In-house Referral: Clinical Social Work   Post Acute Care Choice: Skilled Nursing Facility Living arrangements for the past 2 months: Single Family Home, Skilled Nursing Facility                                       Social Determinants of Health (SDOH) Interventions    Readmission Risk Interventions    07/04/2021    2:49 PM  Readmission Risk Prevention Plan  Transportation Screening Complete  PCP or Specialist Appt within 5-7 Days Complete  Home Care Screening Complete

## 2021-08-31 DIAGNOSIS — G9341 Metabolic encephalopathy: Secondary | ICD-10-CM | POA: Diagnosis not present

## 2021-08-31 DIAGNOSIS — T83511D Infection and inflammatory reaction due to indwelling urethral catheter, subsequent encounter: Secondary | ICD-10-CM | POA: Diagnosis not present

## 2021-08-31 DIAGNOSIS — K5730 Diverticulosis of large intestine without perforation or abscess without bleeding: Secondary | ICD-10-CM | POA: Diagnosis not present

## 2021-08-31 DIAGNOSIS — K922 Gastrointestinal hemorrhage, unspecified: Secondary | ICD-10-CM | POA: Diagnosis not present

## 2021-08-31 LAB — CULTURE, BLOOD (ROUTINE X 2)
Culture: NO GROWTH
Culture: NO GROWTH

## 2021-08-31 LAB — GLUCOSE, CAPILLARY
Glucose-Capillary: 144 mg/dL — ABNORMAL HIGH (ref 70–99)
Glucose-Capillary: 163 mg/dL — ABNORMAL HIGH (ref 70–99)
Glucose-Capillary: 214 mg/dL — ABNORMAL HIGH (ref 70–99)
Glucose-Capillary: 248 mg/dL — ABNORMAL HIGH (ref 70–99)
Glucose-Capillary: 255 mg/dL — ABNORMAL HIGH (ref 70–99)
Glucose-Capillary: 63 mg/dL — ABNORMAL LOW (ref 70–99)

## 2021-08-31 MED ORDER — ASPIRIN EC 81 MG PO TBEC: 81.0000 mg | DELAYED_RELEASE_TABLET | Freq: Every day | ORAL | 11 refills | Status: DC

## 2021-08-31 MED ORDER — ADULT MULTIVITAMIN W/MINERALS CH: 1.0000 | ORAL_TABLET | Freq: Every day | ORAL | Status: AC

## 2021-08-31 MED ORDER — THIAMINE HCL 100 MG PO TABS: 100.0000 mg | ORAL_TABLET | Freq: Every day | ORAL | Status: AC

## 2021-08-31 MED ORDER — ENSURE ENLIVE PO LIQD: 237.0000 mL | Freq: Three times a day (TID) | ORAL | 2 refills | Status: DC

## 2021-08-31 MED ORDER — DRONABINOL 2.5 MG PO CAPS: 2.5000 mg | ORAL_CAPSULE | Freq: Two times a day (BID) | ORAL | 0 refills | Status: DC

## 2021-08-31 MED ORDER — BASAGLAR KWIKPEN 100 UNIT/ML ~~LOC~~ SOPN: 5.0000 [IU] | PEN_INJECTOR | Freq: Every day | SUBCUTANEOUS | Status: DC

## 2021-08-31 NOTE — TOC Transition Note (Signed)
Transition of Care [facility]) - CM/SW Discharge Note   Patient Details  Name: Charles [name] MRN: [id number] Date of Birth: [birth year]  Transition of Care [facility]) CM/SW Contact:  Mearl Latin, LCSW Phone Number: 08/31/2021, 3:59 PM   Clinical Narrative:    Patient will [facility] Anticipated DC date: 08/31/21 Family notified: Maxine Glenn, sister Transport by: Sharin Mons   Per MD patient ready for DC to [place]. RN to call report prior to discharge [phone]). RN, patient, patient's family, and facility notified of DC. Discharge Summary and FL2 sent to facility. DC packet on chart. Ambulance transport requested for patient.   CSW will sign off for now as social work intervention is no longer needed. Please consult [place] again if new needs arise.     Final next level of care: Skilled Nursing Facility Barriers to Discharge: Barriers Resolved   Patient Goals and CMS Choice Patient states their goals for this hospitalization and ongoing recovery are:: Rehab CMS Medicare.gov Compare Post Acute Care list provided to:: Patient Choice offered to / list presented to : Patient  Discharge Placement   Existing PASRR number confirmed : 08/31/21          [facility] Patient to be transferred to facility by: [facility] Name of family member notified: Sister Patient and family notified of of transfer: 08/31/21  Discharge Plan and Services In-house Referral: Clinical Social Work   Post Acute Care Choice: Skilled Nursing Facility                               Social Determinants of Health (SDOH) Interventions     Readmission Risk Interventions    07/04/2021    2:49 PM  Readmission Risk Prevention Plan  Transportation Screening Complete  PCP or Specialist Appt within 5-7 Days Complete  Home Care Screening Complete

## 2021-08-31 NOTE — Progress Notes (Signed)
Several attempts made to given report to [facility] via [phone]). No answer and no voicemail to leave message. Consulting civil engineer and LCSW notified of situation.

## 2021-08-31 NOTE — Discharge Summary (Signed)
Physician Discharge Summary   [name] MRN: [id number] DOB: [birth year]  Admit date:     08/19/2021  Discharge date: 08/31/21  Discharge Physician: Hosie Poisson   PCP: Darreld Mclean, MD   Recommendations at discharge:  Please follow up with PCP in one week.  Please follow up with outpatient palliative services.  Please follow up with Gastroenterology as needed.   Discharge Diagnoses: Principal Problem:   Acute lower GI bleeding Active Problems:   Uncontrolled IDDM-2 with hyperglycemia   Acute blood loss anemia   CAD S/p remote CABG   BPH (benign prostatic hyperplasia)   Leukocytosis   HLD (hyperlipidemia)   Acute metabolic encephalopathy   Orthostatic hypotension   Protein-calorie malnutrition, severe (HCC)   Hypomagnesemia   Physical deconditioning   Unintentional weight loss of more than 10% body weight within 6 months   Hemorrhoids   Diverticulosis of colon   Solitary rectal ulcer   Polyp of ascending colon   Hypokalemia   Delirium   Confusion   Goals of care, counseling/discussion   Catheter-associated urinary tract infection [facility])    Hospital Course:  Charles [name] is a 61 y.o. 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 He also had over 60 pound weight loss in 6 months.  GI consulted.   Next day, Hgb down to 7.7.  Reportedly had a large stool with dark blood.  He was transfused 1 unit with appropriate response.  EGD on 6/11 basically normal.  Colonoscopy showed hemorrhoid, polyp and diverticulosis in ascending colon and solitary rectal ulcer felt to be due to constipation.   H&H has been stable.  Severe orthostatic hypotension with SBP dropping from 120s to 60s just with sitting in bed.  Now with  encephalopathy in the setting of Candida associated UTI.  Blood cultures NGTD. Urine cultures growing E coli . On IV rocephin.  Therapy eval recommending SNF.  Palliative care consulted.   Assessment and Plan: Symptomatic ABLA due to acute lower GI bleed:  - Hematochezia for one week.  - 60 lbs weight loss in 6 months.  - CT abd and pelvis without significant finding.  -EGD is within normal limits Colonoscopy showed hemorrhoids, polyps, diverticulosis in the ascending colon and solitary rectal ulcer secondary to constipation. Biopsy of the polyps revealed benign in nature recommended colonoscopy in 7 years. Transfuse to keep hemoglobin greater than 8 MiraLAX every other day Continue holding aspirin,  recommend restarting it in 1 week.       Acute metabolic encephalopathy  with delirium with underlying cognitive impairment Probably secondary to urinary tract infection/underlying cognitive decline, severe malnutrition and hospital-acquired delirium Neurology consulted recommended outpatient follow-up with Dr. Leta Baptist at [facility] neurology Try to minimize deliriogenic medications, benzodiazepines and anticholinergic medications. No focal deficits to suggest CVA. Completed high-dose thiamine without significant change. Ammonia and B12 levels within normal limits. Patient today is alert and oriented to person and place. Discussed the plan with the patient and sister at bedside.  Currently waiting for SNF placement.      Diverticulosis/polyps of the ascending colon/solitary rectal ulcer/hemorrhoids MiraLAX every other day and Anusol cream.     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 Patient failed voiding trial on August 26, 2021. Replaced Foley catheter Complete the course of IV  rocephin for 5 days.  Urine cultures show E coli .       Uncontrolled insulin-dependent diabetes mellitus with hyperglycemia and hypoglycemia Hemoglobin  A1c is around 10.4 in April 2023 Continue with sliding scale insulin and low-dose Semglee.      Hyperlipidemia Continue with Crestor.       History of coronary artery disease s/p CABG  Patient currently denies any chest pain at this time Continue home medications except for aspirin.     Orthostatic hypotension Probably secondary to autonomic dysregulation and severe malnutrition Continue with compression stockings and abdominal binder along with midodrine. BP parameters are optimal.      BPH with urinary retention and chronic Foley placement Recommend outpatient follow-up with urology. Complete the course of antibiotics for UTI. Discontinued Flomax given significant orthostatic hypotension.       Hypokalemia and hypomagnesemia Replaced. Repeat levels wnl.        GERD EGD within normal limits  and continue with PPI     Severe malnutrition with unintentional weight loss/failure to thrive Patient appears to have lost weight in the last 6 months with significant muscle mass and subcutaneous fat loss. HIV is nonreactive, no explanation on EGD, colonoscopy, CT abdomen and pelvis. TSH is within normal limits Probably secondary to chronic illness from coronary artery disease cognitive decline diabetes mellitus poor oral intake.  Dietary consulted and recommendation given for supplementations.       Physical deconditioning, cognitive decline poor prognosis Palliative care consulted for goals of care discussions.     Pressure Injury 07/02/21 Buttocks Right Stage 2 -  Partial thickness loss of dermis presenting as a shallow open injury with a red, pink wound bed without slough. this is a partial thickness fissure in the gluteal cleft, NOT a pressure injury (Active)  07/02/21 0000  Location: Buttocks  Location Orientation: Right  Staging: Stage 2 -  Partial thickness loss of dermis presenting as a shallow open injury with a red, pink wound bed without slough.  Wound  Description (Comments): this is a partial thickness fissure in the gluteal cleft, NOT a pressure injury  Present on Admission: Yes    Wound care consulted.  Present on admission.    Malnutrition Type:   Nutrition Problem: Severe Malnutrition Etiology: chronic illness (diabetes, CAD, cognitive decline)     Malnutrition Characteristics:   Signs/Symptoms: severe fat depletion, severe muscle depletion, percent weight loss Percent weight loss: 37.5 %     Nutrition Interventions:   Interventions: Ensure Enlive (each supplement provides 350kcal and 20 grams of protein), MVI, Refer to RD note for recommendations   Estimated body mass index is 17.4 kg/m as calculated from the following:   Height as of this encounter: $RemoveBeforeD'5\' 6"'[id number]$  (1.676 m).   Weight as of this encounter: 48.9 kg.          Consultants: gi Palliative care Procedures performed: EGD COLONOSCOPY  Disposition: Skilled nursing facility Diet recommendation:  Regular diet DISCHARGE MEDICATION: Allergies as of 08/31/2021   No Known Allergies      Medication List     STOP taking these medications    gabapentin 300 MG capsule Commonly known as: Neurontin   metFORMIN 1000 MG tablet Commonly known as: GLUCOPHAGE   mirtazapine 7.5 MG tablet Commonly known as: REMERON   oxybutynin 5 MG tablet Commonly known as: DITROPAN   QUEtiapine 25 MG tablet Commonly known as: SEROQUEL   tamsulosin 0.4 MG Caps capsule Commonly known as: FLOMAX  TAKE these medications    aspirin EC 81 MG tablet Take 1 tablet (81 mg total) by mouth daily. Swallow whole. Start taking on: September 07, 2021 What changed: These instructions start on September 07, 2021. If you are unsure what to do until then, ask your doctor or other care provider.   Basaglar KwikPen 100 UNIT/ML Inject 5 Units into the skin daily. What changed: how much to take   blood glucose meter kit and supplies Dispense based on patient and insurance preference. Use  up to four times daily as directed. (FOR ICD-10 E10.9, E11.9).   clotrimazole-betamethasone cream Commonly known as: LOTRISONE Apply topically 2 (two) times daily. Apply to foreskin What changed:  how much to take additional instructions   docusate sodium 100 MG capsule Commonly known as: COLACE Take 100 mg by mouth at bedtime.   dronabinol 2.5 MG capsule Commonly known as: MARINOL Take 1 capsule (2.5 mg total) by mouth 2 (two) times daily before lunch and supper for 10 days.   ezetimibe 10 MG tablet Commonly known as: ZETIA Take 1 tablet (10 mg total) by mouth daily.   feeding supplement Liqd Take 237 mLs by mouth 3 (three) times daily between meals.   FreeStyle Libre 2 Sensor Misc 3 Units by Does not apply route 3 (three) times daily with meals.   HumaLOG KwikPen 100 UNIT/ML KwikPen Generic drug: insulin lispro Before each meal 3 times a day, 140-199 - 2 units, 200-250 - 4 units, 251-299 - 6 units,  300-349 - 8 units,  350 or above 10 units. What changed:  how much to take how to take this when to take this additional instructions   midodrine 10 MG tablet Commonly known as: PROAMATINE Take 1 tablet (10 mg total) by mouth 3 (three) times daily with meals. What changed: when to take this   multivitamin with minerals Tabs tablet Take 1 tablet by mouth daily. Start taking on: September 01, 2021   pantoprazole 40 MG tablet Commonly known as: PROTONIX Take 1 tablet (40 mg total) by mouth daily. Any generic PPI is okay to dispense What changed: additional instructions   polyethylene glycol 17 g packet Commonly known as: MIRALAX / GLYCOLAX Take 17 g by mouth daily.   Preparation H Rapid Relief 5-0.25-14.4-15 % Crea Place 1 Application rectally every 6 (six) hours as needed (hemorrhoids).   rosuvastatin 40 MG tablet Commonly known as: CRESTOR Take 1 tablet (40 mg total) by mouth daily.   thiamine 100 MG tablet Take 1 tablet (100 mg total) by mouth daily. Start  taking on: September 01, 2021        Discharge Exam: Danley Danker Weights   08/29/21 0444 08/30/21 0455 08/31/21 0500  Weight: 96 kg 48.9 kg 47.5 kg   General exam: Appears calm and comfortable  Respiratory system: Clear to auscultation. Respiratory effort normal. Cardiovascular system: S1 & S2 heard, RRR. No JVD, murmurs, rubs, gallops or clicks. No pedal edema. Gastrointestinal system: Abdomen is nondistended, soft and nontender. No organomegaly or masses felt. Normal bowel sounds heard. Central nervous system: Alert and oriented. No focal neurological deficits. Extremities: Symmetric 5 x 5 power. Skin: No rashes, lesions or ulcers Psychiatry: Judgement and insight appear normal. Mood & affect appropriate.    Condition at discharge: fair  The results of significant diagnostics from this hospitalization (including imaging, microbiology, ancillary and laboratory) are listed below for reference.   Imaging Studies: MR BRAIN W WO CONTRAST  Result Date: 08/22/2021 CLINICAL DATA:  Initial evaluation  for brain/CNS neoplasm, staging. EXAM: MRI HEAD WITHOUT AND WITH CONTRAST TECHNIQUE: Multiplanar, multiecho pulse sequences of the brain and surrounding structures were obtained without and with intravenous contrast. CONTRAST:  4.78mL GADAVIST GADOBUTROL 1 MMOL/ML IV SOLN COMPARISON:  Prior study from 07/03/2021. FINDINGS: Brain: Age-related cerebral volume loss, stable. Small remote lacunar infarct present at the right basal ganglia. Hyperintensity mild T2/FLAIR hyperintensity involving periventricular white matter, stable. No evidence for acute or subacute ischemia. Gray-white matter differentiation maintained. No areas of remote cortical infarction. No acute intracranial hemorrhage. Few subtle punctate chronic micro hemorrhages noted about the periventricular white matter, likely small vessel/hypertensive in nature. 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 Diffuse ventriculomegaly again seen,  somewhat out of proportion to cortical sulcation, similar to previous. No other mass lesion, mass effect, or midline shift. No extra-axial fluid collection. No abnormal enhancement. Pituitary gland suprasellar region normal. Vascular: Major intracranial vascular flow voids are maintained. Skull and upper cervical spine: Craniocervical junction within normal limits. Bone marrow signal intensity normal. No scalp soft tissue abnormality. Sinuses/Orbits: Prior bilateral ocular lens replacement. Scattered mucosal thickening noted about the ethmoidal air cells and maxillary sinuses. Small right mastoid effusion noted. Other: None. IMPRESSION: 1. No acute intracranial abnormality. 2. 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 3. Ventriculomegaly with mild periventricular T2/FLAIR signal abnormality, stable. 4. Small remote right basal ganglia lacunar infarct. Electronically Signed   By: Jeannine Boga M.D.   On: 08/22/2021 19:26   CT Abdomen Pelvis Wo Contrast  Result Date: 08/19/2021 CLINICAL DATA:  Abdominal pain EXAM: CT ABDOMEN AND PELVIS WITHOUT CONTRAST TECHNIQUE: Multidetector CT imaging of the abdomen and pelvis was performed following the standard protocol without IV contrast. RADIATION DOSE REDUCTION: This exam was performed according to the departmental dose-optimization program which includes automated exposure control, adjustment of the mA and/or kV according to patient size and/or use of iterative reconstruction technique. COMPARISON:  07/03/2021 FINDINGS: Lower Chest: Normal. Hepatobiliary: Normal hepatic contours. No intra- or extrahepatic biliary dilatation. The gallbladder is normal. Pancreas: Normal pancreas. No ductal dilatation or peripancreatic fluid collection. Spleen: Normal. Adrenals/Urinary Tract: The adrenal glands are normal. No hydronephrosis, nephroureterolithiasis or solid renal mass. Decompressed by Foley catheter. Stomach/Bowel: There is no hiatal hernia. Normal duodenal  course and caliber. No small bowel dilatation or inflammation. No focal colonic abnormality. Normal appendix. Vascular/Lymphatic: There is calcific atherosclerosis of the abdominal aorta. No lymphadenopathy. Reproductive: Normal prostate size with symmetric seminal vesicles. Other: None. Musculoskeletal: No bony spinal canal stenosis or focal osseous abnormality. IMPRESSION: 1. No acute abnormality of the abdomen or pelvis. 2. Aortic Atherosclerosis (ICD10-I70.0). Electronically Signed   By: Ulyses Jarred M.D.   On: 08/19/2021 19:23    Microbiology: Results for orders placed or performed during the hospital encounter of 08/19/21  Culture, blood (Routine X 2) w Reflex to ID Panel     Status: None   Collection Time: 08/26/21  3:32 PM   Specimen: BLOOD  Result Value Ref Range Status   Specimen Description BLOOD RIGHT ANTECUBITAL  Final   Special Requests   Final    BOTTLES DRAWN AEROBIC AND ANAEROBIC Blood Culture results may not be optimal due to an inadequate volume of blood received in culture bottles   Culture   Final    NO GROWTH 5 DAYS Performed at [facility], [place] [address], [place], [place] [address]    Report Status 08/31/2021 FINAL  Final  Culture, blood (Routine X 2) w  Reflex to ID Panel     Status: None   Collection Time: 08/26/21  3:39 PM   Specimen: BLOOD RIGHT HAND  Result Value Ref Range Status   Specimen Description BLOOD RIGHT HAND  Final   Special Requests   Final    BOTTLES DRAWN AEROBIC ONLY Blood Culture results may not be optimal due to an inadequate volume of blood received in culture bottles   Culture   Final    NO GROWTH 5 DAYS Performed at [facility], [place] [address] [place], [place] [address]    Report Status 08/31/2021 FINAL  Final  Urine Culture     Status: Abnormal   Collection Time: 08/26/21  7:34 PM   Specimen: Urine, Catheterized  Result Value Ref Range Status   Specimen Description URINE, CATHETERIZED  Final   Special Requests   Final     NONE Performed at [facility], [place] [address] [place], [place] [address]    Culture >=100,000 COLONIES/mL ESCHERICHIA COLI (A)  Final   Report Status 08/29/2021 FINAL  Final   Organism ID, Bacteria ESCHERICHIA COLI (A)  Final      Susceptibility   Escherichia coli - MIC*    AMPICILLIN >=32 RESISTANT Resistant     CEFAZOLIN <=4 SENSITIVE Sensitive     CEFEPIME <=0.12 SENSITIVE Sensitive     CEFTRIAXONE <=0.25 SENSITIVE Sensitive     CIPROFLOXACIN <=0.25 SENSITIVE Sensitive     GENTAMICIN <=1 SENSITIVE Sensitive     IMIPENEM <=0.25 SENSITIVE Sensitive     NITROFURANTOIN <=16 SENSITIVE Sensitive     TRIMETH/SULFA <=20 SENSITIVE Sensitive     AMPICILLIN/SULBACTAM >=32 RESISTANT Resistant     PIP/TAZO <=4 SENSITIVE Sensitive     * >=100,000 COLONIES/mL ESCHERICHIA COLI    Labs: CBC: Recent Labs  Lab 08/25/21 0334 08/25/21 1202 08/26/21 0041 08/27/21 0049 08/29/21 0850 08/30/21 1500  WBC 7.2  --  13.7* 9.2 7.1  --   NEUTROABS  --   --   --  7.5 5.4  --   HGB 8.2*  8.0* 8.4* 8.9* 8.3* 7.6* 8.6*  HCT 23.7*  23.6* 25.4* 26.5* 25.5* 22.2* 26.5*  MCV 84.3  --  85.2 87.3 84.1  --   PLT 292  --  319 301 319  --    Basic Metabolic Panel: Recent Labs  Lab 08/25/21 0334 08/26/21 0041 08/27/21 0049 08/29/21 0850 08/30/21 1201  NA 137 133* 131* 137 137  K 4.0 4.5 3.6 3.3* 4.3  CL 104 101 100 104 106  CO2 $Re'23 22 22 22 23  'Mod$ GLUCOSE 142* 151* 179* 196* 280*  BUN $Re'8 13 15 'kVl$ 5* 7  CREATININE 0.64 0.72 0.81 0.49* 0.50*  CALCIUM 8.5* 8.7* 8.1* 8.0* 8.3*  MG 1.6* 1.7 1.8  --   --   PHOS 3.0 2.2* 3.1  --   --    Liver Function Tests: Recent Labs  Lab 08/25/21 0334 08/26/21 0041 08/27/21 0049  AST  --   --  24  ALT  --   --  18  ALKPHOS  --   --  112  BILITOT  --   --  0.3  PROT  --   --  6.1*  ALBUMIN 2.0* 2.3* 2.2*   CBG: Recent Labs  Lab 08/31/21 0020 08/31/21 0310 08/31/21 0709 08/31/21 0729 08/31/21 1152  GLUCAP 163* 63* 248* 255* 144*    Discharge  time spent: 39 MINUTES.   Signed: Hosie Poisson, MD [facility]  08/31/2021 

## 2021-09-09 ENCOUNTER — Encounter (HOSPITAL_COMMUNITY): Payer: Self-pay

## 2021-09-09 ENCOUNTER — Observation Stay (HOSPITAL_COMMUNITY)
Admission: EM | Admit: 2021-09-09 | Discharge: 2021-09-11 | Disposition: A | Discharge status: Skilled Nursing Facility | Payer: Commercial Managed Care - HMO | Attending: Internal Medicine | Admitting: Internal Medicine

## 2021-09-09 ENCOUNTER — Other Ambulatory Visit: Payer: Self-pay

## 2021-09-09 DIAGNOSIS — D638 Anemia in other chronic diseases classified elsewhere: Secondary | ICD-10-CM | POA: Diagnosis present

## 2021-09-09 DIAGNOSIS — Z951 Presence of aortocoronary bypass graft: Secondary | ICD-10-CM | POA: Diagnosis not present

## 2021-09-09 DIAGNOSIS — Z7982 Long term (current) use of aspirin: Secondary | ICD-10-CM | POA: Diagnosis not present

## 2021-09-09 DIAGNOSIS — K922 Gastrointestinal hemorrhage, unspecified: Secondary | ICD-10-CM | POA: Diagnosis present

## 2021-09-09 DIAGNOSIS — I10 Essential (primary) hypertension: Secondary | ICD-10-CM | POA: Diagnosis not present

## 2021-09-09 DIAGNOSIS — I2510 Atherosclerotic heart disease of native coronary artery without angina pectoris: Secondary | ICD-10-CM | POA: Diagnosis not present

## 2021-09-09 DIAGNOSIS — E785 Hyperlipidemia, unspecified: Secondary | ICD-10-CM | POA: Diagnosis present

## 2021-09-09 DIAGNOSIS — Z79899 Other long term (current) drug therapy: Secondary | ICD-10-CM | POA: Insufficient documentation

## 2021-09-09 DIAGNOSIS — Z794 Long term (current) use of insulin: Secondary | ICD-10-CM | POA: Insufficient documentation

## 2021-09-09 DIAGNOSIS — E119 Type 2 diabetes mellitus without complications: Secondary | ICD-10-CM | POA: Diagnosis not present

## 2021-09-09 NOTE — ED Triage Notes (Signed)
BIB [facility] from [place] facility c/o possible GI bleed. Nurse was changing pt and found significant amount of blood. Pt has a hx of GI bleed

## 2021-09-10 ENCOUNTER — Encounter (HOSPITAL_COMMUNITY): Payer: Self-pay | Admitting: Internal Medicine

## 2021-09-10 DIAGNOSIS — Z794 Long term (current) use of insulin: Secondary | ICD-10-CM

## 2021-09-10 DIAGNOSIS — K625 Hemorrhage of anus and rectum: Secondary | ICD-10-CM | POA: Diagnosis not present

## 2021-09-10 DIAGNOSIS — E119 Type 2 diabetes mellitus without complications: Secondary | ICD-10-CM

## 2021-09-10 DIAGNOSIS — K626 Ulcer of anus and rectum: Secondary | ICD-10-CM | POA: Diagnosis not present

## 2021-09-10 DIAGNOSIS — D638 Anemia in other chronic diseases classified elsewhere: Secondary | ICD-10-CM | POA: Diagnosis not present

## 2021-09-10 DIAGNOSIS — E785 Hyperlipidemia, unspecified: Secondary | ICD-10-CM

## 2021-09-10 DIAGNOSIS — K922 Gastrointestinal hemorrhage, unspecified: Secondary | ICD-10-CM | POA: Diagnosis not present

## 2021-09-10 DIAGNOSIS — K648 Other hemorrhoids: Secondary | ICD-10-CM | POA: Diagnosis not present

## 2021-09-10 LAB — CBC WITH DIFFERENTIAL/PLATELET
Abs Immature Granulocytes: 0.02 10*3/uL (ref 0.00–0.07)
Abs Immature Granulocytes: 0.05 10*3/uL (ref 0.00–0.07)
Basophils Absolute: 0.1 10*3/uL (ref 0.0–0.1)
Basophils Absolute: 0.1 10*3/uL (ref 0.0–0.1)
Basophils Relative: 1 %
Basophils Relative: 1 %
Eosinophils Absolute: 0.1 10*3/uL (ref 0.0–0.5)
Eosinophils Absolute: 0.1 10*3/uL (ref 0.0–0.5)
Eosinophils Relative: 1 %
Eosinophils Relative: 1 %
HCT: 32 % — ABNORMAL LOW (ref 39.0–52.0)
HCT: 33.1 % — ABNORMAL LOW (ref 39.0–52.0)
Hemoglobin: 10.4 g/dL — ABNORMAL LOW (ref 13.0–17.0)
Hemoglobin: 10.7 g/dL — ABNORMAL LOW (ref 13.0–17.0)
Immature Granulocytes: 0 %
Immature Granulocytes: 1 %
Lymphocytes Relative: 14 %
Lymphocytes Relative: 20 %
Lymphs Abs: 1.3 10*3/uL (ref 0.7–4.0)
Lymphs Abs: 1.6 10*3/uL (ref 0.7–4.0)
MCH: 28.5 pg (ref 26.0–34.0)
MCH: 29.1 pg (ref 26.0–34.0)
MCHC: 32.3 g/dL (ref 30.0–36.0)
MCHC: 32.5 g/dL (ref 30.0–36.0)
MCV: 88.3 fL (ref 80.0–100.0)
MCV: 89.4 fL (ref 80.0–100.0)
Monocytes Absolute: 0.6 10*3/uL (ref 0.1–1.0)
Monocytes Absolute: 0.6 10*3/uL (ref 0.1–1.0)
Monocytes Relative: 6 %
Monocytes Relative: 7 %
Neutro Abs: 5.6 10*3/uL (ref 1.7–7.7)
Neutro Abs: 7.6 10*3/uL (ref 1.7–7.7)
Neutrophils Relative %: 71 %
Neutrophils Relative %: 77 %
Platelets: 436 10*3/uL — ABNORMAL HIGH (ref 150–400)
Platelets: 530 10*3/uL — ABNORMAL HIGH (ref 150–400)
RBC: 3.58 MIL/uL — ABNORMAL LOW (ref 4.22–5.81)
RBC: 3.75 MIL/uL — ABNORMAL LOW (ref 4.22–5.81)
RDW: 16.3 % — ABNORMAL HIGH (ref 11.5–15.5)
RDW: 16.4 % — ABNORMAL HIGH (ref 11.5–15.5)
WBC: 7.9 10*3/uL (ref 4.0–10.5)
WBC: 9.8 10*3/uL (ref 4.0–10.5)
nRBC: 0 % (ref 0.0–0.2)
nRBC: 0 % (ref 0.0–0.2)

## 2021-09-10 LAB — COMPREHENSIVE METABOLIC PANEL
ALT: 15 U/L (ref 0–44)
ALT: 17 U/L (ref 0–44)
AST: 21 U/L (ref 15–41)
AST: 22 U/L (ref 15–41)
Albumin: 2.6 g/dL — ABNORMAL LOW (ref 3.5–5.0)
Albumin: 2.8 g/dL — ABNORMAL LOW (ref 3.5–5.0)
Alkaline Phosphatase: 102 U/L (ref 38–126)
Alkaline Phosphatase: 94 U/L (ref 38–126)
Anion gap: 10 (ref 5–15)
Anion gap: 10 (ref 5–15)
BUN: 14 mg/dL (ref 6–20)
BUN: 14 mg/dL (ref 6–20)
CO2: 24 mmol/L (ref 22–32)
CO2: 25 mmol/L (ref 22–32)
Calcium: 9 mg/dL (ref 8.9–10.3)
Calcium: 9.3 mg/dL (ref 8.9–10.3)
Chloride: 101 mmol/L (ref 98–111)
Chloride: 101 mmol/L (ref 98–111)
Creatinine, Ser: 0.65 mg/dL (ref 0.61–1.24)
Creatinine, Ser: 0.66 mg/dL (ref 0.61–1.24)
GFR, Estimated: >60 mL/min (ref >60)
GFR, Estimated: >60 mL/min (ref >60)
Glucose, Bld: 152 mg/dL — ABNORMAL HIGH (ref 70–99)
Glucose, Bld: 165 mg/dL — ABNORMAL HIGH (ref 70–99)
Potassium: 3.8 mmol/L (ref 3.5–5.1)
Potassium: 3.9 mmol/L (ref 3.5–5.1)
Sodium: 135 mmol/L (ref 135–145)
Sodium: 136 mmol/L (ref 135–145)
Total Bilirubin: 0.4 mg/dL (ref 0.3–1.2)
Total Bilirubin: 0.4 mg/dL (ref 0.3–1.2)
Total Protein: 6.4 g/dL — ABNORMAL LOW (ref 6.5–8.1)
Total Protein: 6.5 g/dL (ref 6.5–8.1)

## 2021-09-10 LAB — HEMOGLOBIN AND HEMATOCRIT, BLOOD
HCT: 30 % — ABNORMAL LOW (ref 39.0–52.0)
HCT: 33 % — ABNORMAL LOW (ref 39.0–52.0)
HCT: 32.4 % — ABNORMAL LOW (ref 39.0–52.0)
Hemoglobin: 9.4 g/dL — ABNORMAL LOW (ref 13.0–17.0)
Hemoglobin: 10.8 g/dL — ABNORMAL LOW (ref 13.0–17.0)
Hemoglobin: 10.6 g/dL — ABNORMAL LOW (ref 13.0–17.0)

## 2021-09-10 LAB — PROTIME-INR
INR: 1 (ref 0.8–1.2)
Prothrombin Time: 12.9 seconds (ref 11.4–15.2)

## 2021-09-10 LAB — CBG MONITORING, ED
Glucose-Capillary: 148 mg/dL — ABNORMAL HIGH (ref 70–99)
Glucose-Capillary: 127 mg/dL — ABNORMAL HIGH (ref 70–99)
Glucose-Capillary: 146 mg/dL — ABNORMAL HIGH (ref 70–99)

## 2021-09-10 LAB — PREPARE RBC (CROSSMATCH)

## 2021-09-10 LAB — APTT: aPTT: 35 seconds (ref 24–36)

## 2021-09-10 LAB — GLUCOSE, CAPILLARY: Glucose-Capillary: 165 mg/dL — ABNORMAL HIGH (ref 70–99)

## 2021-09-10 LAB — POC OCCULT BLOOD, ED: Fecal Occult Bld: POSITIVE — AB

## 2021-09-10 LAB — AMMONIA: Ammonia: <10 umol/L (ref 9–35)

## 2021-09-10 LAB — MAGNESIUM: Magnesium: 1.7 mg/dL (ref 1.7–2.4)

## 2021-09-10 MED ORDER — SODIUM CHLORIDE 0.9 % IV BOLUS
1000.0000 mL | Freq: Once | INTRAVENOUS | Status: AC
  Administered 2021-09-10: 1000 mL via INTRAVENOUS

## 2021-09-10 MED ORDER — HYDROCORTISONE (PERIANAL) 2.5 % EX CREA
TOPICAL_CREAM | Freq: Two times a day (BID) | CUTANEOUS | Status: DC
Start: 2021-09-10 — End: 2021-09-11
  Filled 2021-09-10: qty 28.35

## 2021-09-10 MED ORDER — MIDODRINE HCL 5 MG PO TABS
10.0000 mg | ORAL_TABLET | Freq: Every day | ORAL | Status: DC
  Administered 2021-09-10 – 2021-09-11 (×2): 10 mg via ORAL
  Filled 2021-09-10 (×2): qty 2

## 2021-09-10 MED ORDER — SODIUM CHLORIDE 0.9% IV SOLUTION: Freq: Once | INTRAVENOUS | Status: DC

## 2021-09-10 MED ORDER — ACETAMINOPHEN 325 MG PO TABS: 650.0000 mg | ORAL_TABLET | Freq: Four times a day (QID) | ORAL | Status: DC | PRN

## 2021-09-10 MED ORDER — ROSUVASTATIN CALCIUM 20 MG PO TABS
40.0000 mg | ORAL_TABLET | Freq: Every day | ORAL | Status: DC
  Administered 2021-09-10 – 2021-09-11 (×2): 40 mg via ORAL
  Filled 2021-09-10 (×2): qty 2

## 2021-09-10 MED ORDER — PANTOPRAZOLE SODIUM 40 MG IV SOLR
40.0000 mg | INTRAVENOUS | Status: DC
  Administered 2021-09-10 – 2021-09-11 (×2): 40 mg via INTRAVENOUS
  Filled 2021-09-10 (×2): qty 10

## 2021-09-10 MED ORDER — THIAMINE HCL 100 MG PO TABS
100.0000 mg | ORAL_TABLET | Freq: Every day | ORAL | Status: DC
  Administered 2021-09-10 – 2021-09-11 (×2): 100 mg via ORAL
  Filled 2021-09-10 (×2): qty 1

## 2021-09-10 MED ORDER — INSULIN ASPART 100 UNIT/ML IJ SOLN
0.0000 [IU] | Freq: Four times a day (QID) | INTRAMUSCULAR | Status: DC
  Administered 2021-09-10: 2 [IU] via SUBCUTANEOUS
  Administered 2021-09-10 (×2): 1 [IU] via SUBCUTANEOUS
  Administered 2021-09-11: 0 [IU] via SUBCUTANEOUS
  Administered 2021-09-11: 1 [IU] via SUBCUTANEOUS
  Administered 2021-09-11: 2 [IU] via SUBCUTANEOUS

## 2021-09-10 MED ORDER — EZETIMIBE 10 MG PO TABS
10.0000 mg | ORAL_TABLET | Freq: Every day | ORAL | Status: DC
  Administered 2021-09-10 – 2021-09-11 (×2): 10 mg via ORAL
  Filled 2021-09-10 (×2): qty 1

## 2021-09-10 MED ORDER — ACETAMINOPHEN 650 MG RE SUPP: 650.0000 mg | Freq: Four times a day (QID) | RECTAL | Status: DC | PRN

## 2021-09-10 NOTE — ED Notes (Signed)
Lab at bedside. Provider made aware of current VS at this time

## 2021-09-10 NOTE — Progress Notes (Signed)
Same day note  Patient seen and examined at bedside.  Patient was admitted to the hospital for blood per rectum  At the time of my evaluation, patient complains of no further bleeding.  Denies any chest pain, shortness of breath, nausea but has mild abdominal discomfort.  Physical examination reveals thinly built male not in distress, mildly pale.  Nonspecific abdominal tenderness.  Laboratory data and imaging was reviewed  Assessment and Plan.  Acute lower GI bleed.  Recent acute lower GI bleed with colonoscopy which was notable for diverticula and ulceration with polypectomy at the time of colonoscopy.  He hemodynamically stable.  Monitor hemoglobin.  GI has been consulted for further evaluation.  We will continue to monitor the patient.  Transfuse as necessary.  Currently on clear liquids.  Continue Protonix.  Anemia of chronic disease.  Latest hemoglobin of 10.4.  We will continue to monitor.  Type 2 diabetes mellitus.  On insulin glargine at home including sliding scale.  Recent hemoglobin A1c of 10.4.  Continue sliding scale insulin regimen while in the hospital  Hyperlipidemia.  Continue Crestor and Zetia.  No Charge  Signed,  Tenny Craw, MD [facility]

## 2021-09-10 NOTE — Assessment & Plan Note (Signed)
 #)   hyperlipidemia: Documented history of such, on high intensity rosuvastatin as well as Zetia.  Plan: Continue home statin as well as Zetia.

## 2021-09-10 NOTE — Assessment & Plan Note (Signed)
  #)   Anemia of chronic disease: Documented history/associated with baseline hemoglobin range of 10-12, presenting hemoglobin found to be within his baseline range, and actually increased from most recent prior value of 8.6 on 08/30/2021.  Associated with normocytic/normochromic findings, consistent with underlying anemia of chronic disease.  Plan: Further evaluation management of presenting suspected acute lower gastroscopy, as above.  Every 4 hours H&H's ordered through 9 AM today.  Status post type and screen.

## 2021-09-10 NOTE — Assessment & Plan Note (Signed)
 #)   Acute lower gastrointestinal bleed: In the context of a recent hospitalization for suspected acute lower gastrointestinal bleed, the patient presents this evening with incidentally noted blood per rectum, in the absence of any additional acute symptoms, while DRE performed by EDP notable for fecal occult blood positive result.  Hemoglobin at baseline, with presenting value of 10.7, representing increased from most recent prior value of 8.6 on 08/30/2021.  Appears hemodynamically stable.  On-call [facility] gastroenterologist has been consulted, with additional recommendations currently pending.  Type and screen ordered, although there does not appear to be an indication for initiation of PRBC transfusion at this time, given the patient's hemodynamic stability, stable hemoglobin at baseline, and overall asymptomatic nature at this time.  Colonoscopy during most recent previous hospitalization was notable for diverticula ulceration in the rectum , and the patient underwent polypectomy at the time of this colonoscopy.   Plan: Monitor on telemetry.  Gastroenterology consulted, as above, with additional recommendations pending at this time.  Repeat CBC in the morning.  Every 4 hour H&H checks ordered through 9 AM on 09/10/2021.  Clear liquid diet.  Protonix 40 mg IV daily.  Hold home aspirin for now.  Repeat BMP in the morning, with close monitoring of interval trend in BUN level.  Continue outpatient midodrine.

## 2021-09-10 NOTE — Assessment & Plan Note (Signed)
#)   Type 2 Diabetes Mellitus: documented history of such. Home insulin regimen: Insulin glargine 5 units SQ daily as well as sliding scale Humalog 3 times daily with meals. Home oral hypoglycemic agents: None. presenting blood sugar: 152. Most recent A1c noted to be 10.4% when checked on 07/03/2021.   Plan: In setting of current clear liquid diet, have ordered Accu-Cheks every 6 hours with low-dose sliding scale insulin.  Hold home basal insulin for now.

## 2021-09-10 NOTE — ED Notes (Signed)
Per Dr. Arlean Hopping hold on administering the PRBC until repeat H&H is back

## 2021-09-10 NOTE — H&P (Signed)
History and Physical    PLEASE NOTE THAT DRAGON DICTATION SOFTWARE WAS USED IN THE CONSTRUCTION OF THIS NOTE.   Charles [name] KXF:[id number] DOB: Sep 25, 1960 DOA: 09/09/2021  PCP: Darreld Mclean, MD  Patient coming from: SNF  I have personally briefly reviewed patient's old medical records in [place]  Chief Complaint: Bright red Blood per rectum  HPI: Charles [name] is a 61 y.o. male with medical history significant for type 2 diabetes mellitus, hyperlipidemia, anemia of chronic disease associated with baseline hemoglobin range 10-12, who is admitted to [facility] on 09/09/2021 with suspected acute lower gastrointestinal bleed after presenting from SNF to [facility] ED complaining of bright red blood per rectum.   The patient was recently hospitalized at [facility] from 08/19/2021 to 08/31/2021 for acute on chronic anemia in the setting of suspected acute lower gastrointestinal bleed.  During his previous hospitalization, on 08/21/2021, underwent EGD and colonoscopy via Dr. Candis Schatz [facility] gastroenterology, with EGD reportedly unremarkable, while colonoscopy revealed evidence of diverticula ulceration associated the rectum, and also underwent polypectomy.  Over the course of his previous hospitalization, lowest hemoglobin was noted to be 7.7, with final hemoglobin data point during his previous hospitalization noted to be 8.6 on 08/30/2021.  Patient was subsequently discharged to SNF.   On 09/09/2021, when hoping to change the patient SNF staff incidentally noted the patient to be exhibiting evidence of bright red blood per rectum, prompting the patient to be brought to [facility] emergency department for further evaluation management thereof.  No associated melena.  Patient is without any acute complaint, including no associated abdominal pain, chest pain, shortness of breath, diaphoresis, palpitations, nausea, vomiting.  He is on a daily baby aspirin, but otherwise on no blood  thinners as an outpatient.  No reported history of known underlying liver disease.  Not on any NSAIDs.  Per chart review, he has a history of anemia of chronic disease, associated baseline hemoglobin range of 10-12.      ED Course:  Vital signs in the ED were notable for the following: Afebrile; heart rate 6773; blood pressure 111/74.  Labs were notable for the following: CMP notable for the following: BUN 14 compared to 7 on 08/30/2021, creatinine 0.65, liver enzymes within normal limits.  CBC notable for will with cell count 7900, hemoglobin 10.7 associated normocytic/normochromic findings, platelet count 530.  INR 1.0.  Type and screen ordered.  Fecal occult blood test found to be positive.  Imaging and additional notable ED work-up: EKG ordered, with result currently pending.  EDP notified on-call [place] gastroenterology, and they will formally consult and evaluate patient, with additional recommendations pending at this time.  While in the ED, the following were administered: None  Subsequently, the patient was admitted for overnight observation to med telemetry for further evaluation and management of suspected acute/recurrent lower gastrointestinal bleed.    Review of Systems: As per HPI otherwise 10 point review of systems negative.   Past Medical History:  Diagnosis Date   Arthritis    Diabetes mellitus without complication ([place])    Heart disease    Hypertension     Past Surgical History:  Procedure Laterality Date   COLONOSCOPY WITH PROPOFOL N/A 08/21/2021   Procedure: COLONOSCOPY WITH PROPOFOL;  Surgeon: Daryel November, MD;  Location: [facility] ENDOSCOPY;  Service: Gastroenterology;  Laterality: N/A;   CORONARY ARTERY BYPASS GRAFT     ESOPHAGOGASTRODUODENOSCOPY (EGD) WITH PROPOFOL N/A 08/21/2021   Procedure: ESOPHAGOGASTRODUODENOSCOPY (EGD) WITH PROPOFOL;  Surgeon: Candis Schatz,  Gladstone Pih, MD;  Location: [place] ENDOSCOPY;  Service: Gastroenterology;  Laterality: N/A;    POLYPECTOMY  08/21/2021   Procedure: POLYPECTOMY;  Surgeon: Daryel November, MD;  Location: [facility] ENDOSCOPY;  Service: Gastroenterology;;    Social History:  reports that he has never smoked. He has never used smokeless tobacco. He reports that he does not currently use alcohol. He reports that he does not use drugs.   No Known Allergies  Family History  Problem Relation Age of Onset   Diabetes Mother    Hypertension Mother    Diabetes Father    Hypertension Father    Heart attack Father    Diabetes Sister    Diabetes Sister     Family history reviewed and not pertinent    Prior to Admission medications   Medication Sig Start Date End Date Taking? Authorizing Provider  aspirin EC 81 MG tablet Take 1 tablet (81 mg total) by mouth daily. Swallow whole. 09/07/21   Hosie Poisson, MD  blood glucose meter kit and supplies Dispense based on patient and insurance preference. Use up to four times daily as directed. (FOR ICD-10 E10.9, E11.9). 12/23/20   de [place], Blondell Reveal, MD  clotrimazole-betamethasone (LOTRISONE) cream Apply topically 2 (two) times daily. Apply to foreskin Patient taking differently: Apply 1 application  topically 2 (two) times daily. 04/25/21   Rai, Vernelle Emerald, MD  Continuous Blood Gluc Sensor (FREESTYLE LIBRE 2 SENSOR) MISC 3 Units by Does not apply route 3 (three) times daily with meals. 04/25/21   Rai, Vernelle Emerald, MD  docusate sodium (COLACE) 100 MG capsule Take 100 mg by mouth at bedtime.    [provider]  dronabinol (MARINOL) 2.5 MG capsule Take 1 capsule (2.5 mg total) by mouth 2 (two) times daily before lunch and supper for 10 days. 08/31/21 09/10/21  Hosie Poisson, MD  ezetimibe (ZETIA) 10 MG tablet Take 1 tablet (10 mg total) by mouth daily. 07/07/21   Thurnell Lose, MD  feeding supplement (ENSURE ENLIVE / ENSURE PLUS) LIQD Take 237 mLs by mouth 3 (three) times daily between meals. 08/31/21 11/29/21  Hosie Poisson, MD  HUMALOG KWIKPEN 100 UNIT/ML KwikPen  Before each meal 3 times a day, 140-199 - 2 units, 200-250 - 4 units, 251-299 - 6 units,  300-349 - 8 units,  350 or above 10 units. Patient taking differently: Inject 2-10 Units into the skin See admin instructions. Before each meal 3 times a day, 140-199 - 2 units, 200-250 - 4 units, 251-299 - 6 units,  300-349 - 8 units,  350 or above 10 units. Call MD <70 or >350 07/07/21   Thurnell Lose, MD  Insulin [facility]) 100 UNIT/ML Inject 5 Units into the skin daily. 08/31/21   Hosie Poisson, MD  Lido-PE-Glycerin-Petrolatum (PREPARATION H RAPID RELIEF) 5-0.25-14.4-15 % CREA Place 1 Application rectally every 6 (six) hours as needed (hemorrhoids).    [provider]  midodrine (PROAMATINE) 10 MG tablet Take 1 tablet (10 mg total) by mouth 3 (three) times daily with meals. Patient taking differently: Take 10 mg by mouth daily. 07/07/21   Thurnell Lose, MD  Multiple Vitamin (MULTIVITAMIN WITH MINERALS) TABS tablet Take 1 tablet by mouth daily. 09/01/21   Hosie Poisson, MD  pantoprazole (PROTONIX) 40 MG tablet Take 1 tablet (40 mg total) by mouth daily. Any generic PPI is okay to dispense Patient taking differently: Take 40 mg by mouth daily. 04/25/21   Rai, Vernelle Emerald, MD  polyethylene glycol (  MIRALAX / GLYCOLAX) 17 g packet Take 17 g by mouth daily.    [provider]  rosuvastatin (CRESTOR) 40 MG tablet Take 1 tablet (40 mg total) by mouth daily. 04/25/21   Rai, Vernelle Emerald, MD  thiamine 100 MG tablet Take 1 tablet (100 mg total) by mouth daily. 09/01/21   Hosie Poisson, MD     Objective    Physical Exam: Vitals:   09/10/21 0530 09/10/21 0545 09/10/21 0600 09/10/21 0615  BP: 120/72 109/71 129/82 135/75  Pulse: 65 71 67 65  Resp: $Remo'12 13 12 16  'Lbkum$ Temp:      TempSrc:      SpO2: 100% 100% 100% 100%  Weight:      Height:        General: appears to be stated age; alert, oriented Skin: warm, dry, no rash Head:  AT/[place] Mouth:  Oral mucosa membranes appear moist,  normal dentition Neck: supple; trachea midline Heart:  RRR; did not appreciate any M/R/G Lungs: CTAB, did not appreciate any wheezes, rales, or rhonchi Abdomen: + BS; soft, ND, NT Vascular: 2+ pedal pulses b/l; 2+ radial pulses b/l Extremities: no peripheral edema, no muscle wasting Neuro: strength and sensation intact in upper and lower extremities b/l   Labs on Admission: I have personally reviewed following labs and imaging studies  CBC: Recent Labs  Lab 09/09/21 2346 09/10/21 0428  WBC 7.9 9.8  NEUTROABS 5.6 7.6  HGB 10.7* 10.4*  HCT 33.1* 32.0*  MCV 88.3 89.4  PLT 530* 397*   Basic Metabolic Panel: Recent Labs  Lab 09/09/21 2346 09/10/21 0428  NA 136 135  K 3.8 3.9  CL 101 101  CO2 25 24  GLUCOSE 152* 165*  BUN 14 14  CREATININE 0.65 0.66  CALCIUM 9.3 9.0  MG  --  1.7   GFR: Estimated Creatinine Clearance: 67.6 mL/min (by C-G formula based on SCr of 0.66 mg/dL). Liver Function Tests: Recent Labs  Lab 09/09/21 2346 09/10/21 0428  AST 21 22  ALT 17 15  ALKPHOS 102 94  BILITOT 0.4 0.4  PROT 6.5 6.4*  ALBUMIN 2.8* 2.6*   No results for input(s): "LIPASE", "AMYLASE" in the last 168 hours. Recent Labs  Lab 09/10/21 0110  AMMONIA <10   Coagulation Profile: Recent Labs  Lab 09/09/21 2346  INR 1.0   Cardiac Enzymes: No results for input(s): "CKTOTAL", "CKMB", "CKMBINDEX", "TROPONINI" in the last 168 hours. BNP (last 3 results) No results for input(s): "PROBNP" in the last 8760 hours. HbA1C: No results for input(s): "HGBA1C" in the last 72 hours. CBG: Recent Labs  Lab 09/10/21 0533  GLUCAP 148*   Lipid Profile: No results for input(s): "CHOL", "HDL", "LDLCALC", "TRIG", "CHOLHDL", "LDLDIRECT" in the last 72 hours. Thyroid Function Tests: No results for input(s): "TSH", "T4TOTAL", "FREET4", "T3FREE", "THYROIDAB" in the last 72 hours. Anemia Panel: No results for input(s): "VITAMINB12", "FOLATE", "FERRITIN", "TIBC", "IRON", "RETICCTPCT" in the  last 72 hours. Urine analysis:    Component Value Date/Time   COLORURINE AMBER (A) 08/26/2021 1934   APPEARANCEUR CLOUDY (A) 08/26/2021 1934   APPEARANCEUR Hazy (A) 05/09/2021 1006   LABSPEC 1.016 08/26/2021 1934   PHURINE 5.0 08/26/2021 1934   GLUCOSEU NEGATIVE 08/26/2021 1934   HGBUR NEGATIVE 08/26/2021 1934   BILIRUBINUR NEGATIVE 08/26/2021 1934   BILIRUBINUR Negative 05/09/2021 1006   KETONESUR NEGATIVE 08/26/2021 1934   PROTEINUR NEGATIVE 08/26/2021 1934   NITRITE NEGATIVE 08/26/2021 1934   LEUKOCYTESUR LARGE (A) 08/26/2021 1934    Radiological Exams  on Admission: No results found.    Assessment/Plan   Principal Problem:   Acute lower GI bleeding Active Problems:   DM2 (diabetes mellitus, type 2) (HCC)   HLD (hyperlipidemia)   Anemia of chronic disease      #) Acute lower gastrointestinal bleed: In the context of a recent hospitalization for suspected acute lower gastrointestinal bleed, the patient presents this evening with incidentally noted blood per rectum, in the absence of any additional acute symptoms, while DRE performed by EDP notable for fecal occult blood positive result.  Hemoglobin at baseline, with presenting value of 10.7, representing increased from most recent prior value of 8.6 on 08/30/2021.  Appears hemodynamically stable.  On-call [place] gastroenterologist has been consulted, with additional recommendations currently pending.  Type and screen ordered, although there does not appear to be an indication for initiation of PRBC transfusion at this time, given the patient's hemodynamic stability, stable hemoglobin at baseline, and overall asymptomatic nature at this time.  Colonoscopy during most recent previous hospitalization was notable for diverticula ulceration in the rectum , and the patient underwent polypectomy at the time of this colonoscopy.   Plan: Monitor on telemetry.  Gastroenterology consulted, as above, with additional recommendations  pending at this time.  Repeat CBC in the morning.  Every 4 hour H&H checks ordered through 9 AM on 09/10/2021.  Clear liquid diet.  Protonix 40 mg IV daily.  Hold home aspirin for now.  Repeat BMP in the morning, with close monitoring of interval trend in BUN level.  Continue outpatient midodrine.        #) Anemia of chronic disease: Documented history/associated with baseline hemoglobin range of 10-12, presenting hemoglobin found to be within his baseline range, and actually increased from most recent prior value of 8.6 on 08/30/2021.  Associated with normocytic/normochromic findings, consistent with underlying anemia of chronic disease.  Plan: Further evaluation management of presenting suspected acute lower gastroscopy, as above.  Every 4 hours H&H's ordered through 9 AM today.  Status post type and screen.         #) Type 2 Diabetes Mellitus: documented history of such. Home insulin regimen: Insulin glargine 5 units SQ daily as well as sliding scale Humalog 3 times daily with meals. Home oral hypoglycemic agents: None. presenting blood sugar: 152. Most recent A1c noted to be 10.4% when checked on 07/03/2021.   Plan: In setting of current clear liquid diet, have ordered Accu-Cheks every 6 hours with low-dose sliding scale insulin.  Hold home basal insulin for now.        #) hyperlipidemia: Documented history of such, on high intensity rosuvastatin as well as Zetia.  Plan: Continue home statin as well as Zetia.       DVT prophylaxis: SCD's   Code Status: Full code Family Communication: none Disposition Plan: Per Rounding Team Consults called: EDP has consulted on-call [place] gastroenterology, as further detailed above;  Admission status: Observation; med telemetry   PLEASE NOTE THAT DRAGON DICTATION SOFTWARE WAS USED IN THE CONSTRUCTION OF THIS NOTE.   [place] DO [facility]  From [place]   09/10/2021, 6:43 AM

## 2021-09-10 NOTE — ED Notes (Addendum)
Pt sleeping/ resting, spontaneously arousable, NAD, calm, VSS.

## 2021-09-10 NOTE — ED Provider Notes (Signed)
[facility] EMERGENCY DEPARTMENT Provider Note   CSN: [id number] Arrival date & time: 09/09/21  2334     History  Chief Complaint  Patient presents with   GI Problem    Laurent Cargile is a 61 y.o. male.  The history is provided by the patient and medical records.  GI Problem   61 year old male with history of GI bleeding, coronary artery disease, hyperlipidemia, coronary artery disease status post CABG, diabetes, urinary retention, presenting to the ED with GI bleeding.  He was admitted for same 6/10 - 6/21 for same.  He states he was unaware of bleeding until CNA was changing him tonight.  He denies any rectal or abdominal pain.  He did eat/drink today as normal.  No vomiting.  He takes daily ASA, no other anticoagulation.  Followed by [facility], Dr. Candis Schatz.  Underwent EGD/colonoscopy 08/21/21-- EGD was normal.  Colonoscopy with hemorrhoids, diverticula, ulcer in the rectum.  Did have polypectomy of ascending colon.  Home Medications Prior to Admission medications   Medication Sig Start Date End Date Taking? Authorizing Provider  aspirin EC 81 MG tablet Take 1 tablet (81 mg total) by mouth daily. Swallow whole. 09/07/21   Hosie Poisson, MD  blood glucose meter kit and supplies Dispense based on patient and insurance preference. Use up to four times daily as directed. (FOR ICD-10 E10.9, E11.9). 12/23/20   [place], Blondell Reveal, MD  clotrimazole-betamethasone (LOTRISONE) cream Apply topically 2 (two) times daily. Apply to foreskin Patient taking differently: Apply 1 application  topically 2 (two) times daily. 04/25/21   Rai, Vernelle Emerald, MD  Continuous Blood Gluc Sensor (FREESTYLE LIBRE 2 SENSOR) MISC 3 Units by Does not apply route 3 (three) times daily with meals. 04/25/21   Rai, Vernelle Emerald, MD  docusate sodium (COLACE) 100 MG capsule Take 100 mg by mouth at bedtime.    [provider]  dronabinol (MARINOL) 2.5 MG capsule Take 1 capsule (2.5 mg total) by  mouth 2 (two) times daily before lunch and supper for 10 days. 08/31/21 09/10/21  Hosie Poisson, MD  ezetimibe (ZETIA) 10 MG tablet Take 1 tablet (10 mg total) by mouth daily. 07/07/21   Thurnell Lose, MD  feeding supplement (ENSURE ENLIVE / ENSURE PLUS) LIQD Take 237 mLs by mouth 3 (three) times daily between meals. 08/31/21 11/29/21  Hosie Poisson, MD  HUMALOG KWIKPEN 100 UNIT/ML KwikPen Before each meal 3 times a day, 140-199 - 2 units, 200-250 - 4 units, 251-299 - 6 units,  300-349 - 8 units,  350 or above 10 units. Patient taking differently: Inject 2-10 Units into the skin See admin instructions. Before each meal 3 times a day, 140-199 - 2 units, 200-250 - 4 units, 251-299 - 6 units,  300-349 - 8 units,  350 or above 10 units. Call MD <70 or >350 07/07/21   Thurnell Lose, MD  Insulin Glarg[facility]) 100 UNIT/ML Inject 5 Units into the skin daily. 08/31/21   Hosie Poisson, MD  Lido-PE-Glycerin-Petrolatum (PREPARATION H RAPID RELIEF) 5-0.25-14.4-15 % CREA Place 1 Application rectally every 6 (six) hours as needed (hemorrhoids).    [provider]  midodrine (PROAMATINE) 10 MG tablet Take 1 tablet (10 mg total) by mouth 3 (three) times daily with meals. Patient taking differently: Take 10 mg by mouth daily. 07/07/21   Thurnell Lose, MD  Multiple Vitamin (MULTIVITAMIN WITH MINERALS) TABS tablet Take 1 tablet by mouth daily. 09/01/21   Hosie Poisson, MD  pantoprazole (  PROTONIX) 40 MG tablet Take 1 tablet (40 mg total) by mouth daily. Any generic PPI is okay to dispense Patient taking differently: Take 40 mg by mouth daily. 04/25/21   Rai, Ripudeep K, MD  polyethylene glycol (MIRALAX / GLYCOLAX) 17 g packet Take 17 g by mouth daily.    [provider]  rosuvastatin (CRESTOR) 40 MG tablet Take 1 tablet (40 mg total) by mouth daily. 04/25/21   Rai, Vernelle Emerald, MD  thiamine 100 MG tablet Take 1 tablet (100 mg total) by mouth daily. 09/01/21   Hosie Poisson, MD       Allergies    Patient has no known allergies.    Review of Systems   Review of Systems  Gastrointestinal:  Positive for blood in stool.  All other systems reviewed and are negative.   Physical Exam Updated Vital Signs BP 112/72   Pulse 97   Temp 97.6 F (36.4 C) (Oral)   Resp 16   Ht $R'5\' 7"'qC$  (1.702 m)   Wt 47.5 kg   SpO2 94%   BMI 16.40 kg/m   Physical Exam Vitals and nursing note reviewed.  Constitutional:      Appearance: He is well-developed.     Comments: Thin, cachectic appearing  HENT:     Head: Normocephalic and atraumatic.  Eyes:     Conjunctiva/sclera: Conjunctivae normal.     Pupils: Pupils are equal, round, and reactive to light.  Cardiovascular:     Rate and Rhythm: Normal rate and regular rhythm.     Heart sounds: Normal heart sounds.  Pulmonary:     Effort: Pulmonary effort is normal.     Breath sounds: Normal breath sounds.  Abdominal:     General: Bowel sounds are normal.     Palpations: Abdomen is soft.  Genitourinary:    Comments: Hemorrhoids noted on rectal exam but no apparent bleeding; bloody stool noted surrounding rectum Musculoskeletal:        General: Normal range of motion.     Cervical back: Normal range of motion.  Skin:    General: Skin is warm and dry.  Neurological:     Mental Status: He is alert and oriented to person, place, and time.     ED Results / Procedures / Treatments   Labs (all labs ordered are listed, but only abnormal results are displayed) Labs Reviewed  CBC WITH DIFFERENTIAL/PLATELET - Abnormal; Notable for the following components:      Result Value   RBC 3.75 (*)    Hemoglobin 10.7 (*)    HCT 33.1 (*)    RDW 16.4 (*)    Platelets 530 (*)    All other components within normal limits  COMPREHENSIVE METABOLIC PANEL - Abnormal; Notable for the following components:   Glucose, Bld 152 (*)    Albumin 2.8 (*)    All other components within normal limits  POC OCCULT BLOOD, ED - Abnormal; Notable for the  following components:   Fecal Occult Bld POSITIVE (*)    All other components within normal limits  AMMONIA  PROTIME-INR  APTT  TYPE AND SCREEN    EKG None  Radiology No results found.  Procedures Procedures    Medications Ordered in ED Medications  acetaminophen (TYLENOL) tablet 650 mg (has no administration in time range)    Or  acetaminophen (TYLENOL) suppository 650 mg (has no administration in time range)    ED Course/ Medical Decision Making/ A&P  Medical Decision Making Amount and/or Complexity of Data Reviewed Labs: ordered. Radiology: ordered and independent interpretation performed. ECG/medicine tests: ordered and independent interpretation performed.  Risk Decision regarding hospitalization.   61 y.o. M presenting to the ED for GI bleeding.  Admitted here for same 6/10 - 6/21 for same.  Did have colonoscopy and EGD on 08/21/2021-- hemorrhoids, diverticula, polypectomy of ascending colon, ulcer distal rectum.  EGD was essentially normal.  Patient afebrile, nontoxic.  He is thin and somewhat cachectic appearing.  Abdomen soft and nontender.  Rectal exam performed, does have grossly bloody stool present around the rectum.  HD stable at present.  Will check labs, type and screen.  Hemoglobin today 10.7, improved from prior on 6/20 at 8.6.  chemistry is reassuring.  Does not appear to need emergent transfusion.  As he did have recent procedure with GI including polypectomy, feel he will require admission for repeat GI evaluation.  Discussed with Dr. Velia Meyer-- will admit for ongoing care.  Secure message sent to [facility] on call physician for AM evaluation.  Final Clinical Impression(s) / ED Diagnoses Final diagnoses:  Gastrointestinal hemorrhage, unspecified gastrointestinal hemorrhage type    Rx / DC Orders ED Discharge Orders     None         Larene Pickett, PA-C 09/10/21 0150    Fatima Blank, MD 09/11/21  [phone]

## 2021-09-10 NOTE — ED Notes (Signed)
Pt had a BM black tarry stool

## 2021-09-10 NOTE — ED Notes (Addendum)
GI in to see pt, at [facility].

## 2021-09-10 NOTE — Consult Note (Addendum)
Consultation Note   Referring Provider: [facility] PCP: Darreld Mclean, MD Primary Gastroenterologist: Dustin Flock, MD Reason for consultation: GI bleed  Hospital Day: 2  Assessment   # Recurrent painless rectal bleeding. Suspect secondary to large internal hemorrhoids and / or known rectal ulcer found on colonoscopy last month (exam done for rectal bleeding). Though rectum not completely visualized on colonoscopy due to presence of stool the bleeding is still most likely from hemorrhoids or known ulcer   # History of hypotension. Significant orthostatic hypotension during recent admission . On midodrine.  # BPH, urinary obstruction . Has indwelling foley   See PMH for additional medical problems   Plan   Treat hemorrhoids with topical steroids.  He should be okay for discharge. He wants to go home. His hgb is a little confusing. It was 10.4 in ED last night. He got a unit of blood this am and follow up hgb this am was 9.4. Regardless, there is currently no major concern for significant GI bleeding  He denies constipation and stool is soft on DRE.  Continue daily Miralax    HPI   Charles [name] is a 61 y.o. male with a past medical history significant for CAD s/p CABG, diabetes, HTN, colon polyps, solitary rectal ulcer, diverticulosis, chronic constipation, BPH  . See PMH for any additional medical problems.  Patient evaluated in office Nov 2022 for hemorrhoids, bloating, weight loss. Plan was for EGD / colon after cardiac evaluation.   He was seen as inpatient on 08/20/21 for evaluation of rectal bleeding with associated anemia Reviewed consult note. CT scan was unremarkable.  EGD was unremarkable. Colonoscopy remarkable for diverticulosis, solitary distal rectal ulcer felt to be be source of bleeding. An 8 mm sessile serrated adenoma was removed from ascending colon. Rectum not completed visualized due to a large  amount of stool . He was discharged on 6/21  Patient presented to ED this am for evaluation of rectal bleeding. Bleeding found by CNA who changed him during the night. Hemorrhoids on EDP's exam but no apparently bleeding. Bloody stool around rectum was founds     Labs / imaging;  Hgb 10.7 in ED ( up from 8.6 on 6/20) BUN 13, Cr 0.65 [place] 2.8, LFTs otherwise normal.    Patient says that he felt like he was having rectal bleeding yesterday. Blood was found in his diaper. The characteristic / amount of blood is unknown. He has no associated rectal or abdominal pain.  He denies hard stool but does admits to straining at times.   Previous GI Evaluation     08/21/21 EGD and Colonoscopy for anemia, weight loss and rectal bleeding - Hemorrhoids found on perianal exam. - Large amount of solid stool found on digital rectal exam. - One 8 mm polyp in the ascending colon, removed with a cold snare.Resected and retrieved. - Diverticulosis in the ascending colon and in the cecum. - A single (solitary) ulcer in the distal rectum. This is likely a stercoral ulcer given the patient's report of constipation and difficulty evacuating. This is the source of the patient's hematochezia and acute drop in hemoglobin, although a right sided diverticular bleed could also have occurred. The diverticula visualized had no bleeding stigmata and  there was no blood seen outside of the rectum. - Blood in the rectum. - Stool in the rectum. - The examined portion of the ileum was normal. - Non-bleeding internal hemorrhoids and prominent hypertrophied anal papilla. - No findings to explain unintentional weight loss. --A portion of rectum was evaluated due to large amount of stool   FINAL MICROSCOPIC DIAGNOSIS:   A. ASCENDING COLON, POLYP, BIOPSY:  Sessile serrated adenoma without cytologic dysplasia    EGD  - Normal esophagus. - Normal stomach. - Normal examined duodenum. - No specimens collected. - No abnormalities  to explain anemia or unintentional weight loss.  Recent Labs and Imaging MR BRAIN W WO CONTRAST  Result Date: 08/22/2021 CLINICAL DATA:  Initial evaluation for brain/CNS neoplasm, staging. EXAM: MRI HEAD WITHOUT AND WITH CONTRAST TECHNIQUE: Multiplanar, multiecho pulse sequences of the brain and surrounding structures were obtained without and with intravenous contrast. CONTRAST:  4.66mL GADAVIST GADOBUTROL 1 MMOL/ML IV SOLN COMPARISON:  Prior study from 07/03/2021. FINDINGS: Brain: Age-related cerebral volume loss, stable. Small remote lacunar infarct present at the right basal ganglia. Hyperintensity mild T2/FLAIR hyperintensity involving periventricular white matter, stable. No evidence for acute or subacute ischemia. Gray-white matter differentiation maintained. No areas of remote cortical infarction. No acute intracranial hemorrhage. Few subtle punctate chronic micro hemorrhages noted about the periventricular white matter, likely small vessel/hypertensive in nature. 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 Diffuse ventriculomegaly again seen, somewhat out of proportion to cortical sulcation, similar to previous. No other mass lesion, mass effect, or midline shift. No extra-axial fluid collection. No abnormal enhancement. Pituitary gland suprasellar region normal. Vascular: Major intracranial vascular flow voids are maintained. Skull and upper cervical spine: Craniocervical junction within normal limits. Bone marrow signal intensity normal. No scalp soft tissue abnormality. Sinuses/Orbits: Prior bilateral ocular lens replacement. Scattered mucosal thickening noted about the ethmoidal air cells and maxillary sinuses. Small right mastoid effusion noted. Other: None. IMPRESSION: 1. No acute intracranial abnormality. 2. 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 3. Ventriculomegaly with mild periventricular T2/FLAIR signal abnormality, stable. 4. Small remote right basal ganglia lacunar  infarct. Electronically Signed   By: Jeannine Boga M.D.   On: 08/22/2021 19:26   CT Abdomen Pelvis Wo Contrast  Result Date: 08/19/2021 CLINICAL DATA:  Abdominal pain EXAM: CT ABDOMEN AND PELVIS WITHOUT CONTRAST TECHNIQUE: Multidetector CT imaging of the abdomen and pelvis was performed following the standard protocol without IV contrast. RADIATION DOSE REDUCTION: This exam was performed according to the departmental dose-optimization program which includes automated exposure control, adjustment of the mA and/or kV according to patient size and/or use of iterative reconstruction technique. COMPARISON:  07/03/2021 FINDINGS: Lower Chest: Normal. Hepatobiliary: Normal hepatic contours. No intra- or extrahepatic biliary dilatation. The gallbladder is normal. Pancreas: Normal pancreas. No ductal dilatation or peripancreatic fluid collection. Spleen: Normal. Adrenals/Urinary Tract: The adrenal glands are normal. No hydronephrosis, nephroureterolithiasis or solid renal mass. Decompressed by Foley catheter. Stomach/Bowel: There is no hiatal hernia. Normal duodenal course and caliber. No small bowel dilatation or inflammation. No focal colonic abnormality. Normal appendix. Vascular/Lymphatic: There is calcific atherosclerosis of the abdominal aorta. No lymphadenopathy. Reproductive: Normal prostate size with symmetric seminal vesicles. Other: None. Musculoskeletal: No bony spinal canal stenosis or focal osseous abnormality. IMPRESSION: 1. No acute abnormality of the abdomen or pelvis. 2. Aortic Atherosclerosis (ICD10-I70.0). Electronically Signed   By: Ulyses Jarred M.D.   On: 08/19/2021 19:23    Labs:  Recent Labs    09/09/21  2346 09/10/21 0428 09/10/21 0949  WBC 7.9 9.8  --   HGB 10.7* 10.4* 9.4*  HCT 33.1* 32.0* 30.0*  PLT 530* 436*  --    Recent Labs    09/09/21 2346 09/10/21 0428  NA 136 135  K 3.8 3.9  CL 101 101  CO2 25 24  GLUCOSE 152* 165*  BUN 14 14  CREATININE 0.65 0.66  CALCIUM  9.3 9.0   Recent Labs    09/10/21 0428  PROT 6.4*  ALBUMIN 2.6*  AST 22  ALT 15  ALKPHOS 94  BILITOT 0.4   No results for input(s): "HEPBSAG", "HCVAB", "HEPAIGM", "HEPBIGM" in the last 72 hours. Recent Labs    09/09/21 2346  LABPROT 12.9  INR 1.0    Past Medical History:  Diagnosis Date   Arthritis    Diabetes mellitus without complication ([place])    Heart disease    Hypertension     Past Surgical History:  Procedure Laterality Date   COLONOSCOPY WITH PROPOFOL N/A 08/21/2021   Procedure: COLONOSCOPY WITH PROPOFOL;  Surgeon: Daryel November, MD;  Location: [facility] ENDOSCOPY;  Service: Gastroenterology;  Laterality: N/A;   CORONARY ARTERY BYPASS GRAFT     ESOPHAGOGASTRODUODENOSCOPY (EGD) WITH PROPOFOL N/A 08/21/2021   Procedure: ESOPHAGOGASTRODUODENOSCOPY (EGD) WITH PROPOFOL;  Surgeon: Daryel November, MD;  Location: [place];  Service: Gastroenterology;  Laterality: N/A;   POLYPECTOMY  08/21/2021   Procedure: POLYPECTOMY;  Surgeon: Daryel November, MD;  Location: [facility] ENDOSCOPY;  Service: Gastroenterology;;    Family History  Problem Relation Age of Onset   Diabetes Mother    Hypertension Mother    Diabetes Father    Hypertension Father    Heart attack Father    Diabetes Sister    Diabetes Sister     Prior to Admission medications   Medication Sig Start Date End Date Taking? Authorizing Provider  aspirin EC 81 MG tablet Take 1 tablet (81 mg total) by mouth daily. Swallow whole. 09/07/21   Hosie Poisson, MD  blood glucose meter kit and supplies Dispense based on patient and insurance preference. Use up to four times daily as directed. (FOR ICD-10 E10.9, E11.9). 12/23/20   de [place], Blondell Reveal, MD  clotrimazole-betamethasone (LOTRISONE) cream Apply topically 2 (two) times daily. Apply to foreskin Patient taking differently: Apply 1 application  topically 2 (two) times daily. 04/25/21   Rai, Vernelle Emerald, MD  Continuous Blood Gluc Sensor (FREESTYLE LIBRE 2 SENSOR)  MISC 3 Units by Does not apply route 3 (three) times daily with meals. 04/25/21   Rai, Vernelle Emerald, MD  docusate sodium (COLACE) 100 MG capsule Take 100 mg by mouth at bedtime.    [provider]  dronabinol (MARINOL) 2.5 MG capsule Take 1 capsule (2.5 mg total) by mouth 2 (two) times daily before lunch and supper for 10 days. 08/31/21 09/10/21  Hosie Poisson, MD  ezetimibe (ZETIA) 10 MG tablet Take 1 tablet (10 mg total) by mouth daily. 07/07/21   Thurnell Lose, MD  feeding supplement (ENSURE ENLIVE / ENSURE PLUS) LIQD Take 237 mLs by mouth 3 (three) times daily between meals. 08/31/21 11/29/21  Hosie Poisson, MD  HUMALOG KWIKPEN 100 UNIT/ML KwikPen Before each meal 3 times a day, 140-199 - 2 units, 200-250 - 4 units, 251-299 - 6 units,  300-349 - 8 units,  350 or above 10 units. Patient taking differently: Inject 2-10 Units into the skin See admin instructions. Before each meal 3 times a day, 140-199 - 2  units, 200-250 - 4 units, 251-299 - 6 units,  300-349 - 8 units,  350 or above 10 units. Call MD <70 or >350 07/07/21   Thurnell Lose, MD  Insulin Glargine [facility]) 100 UNIT/ML Inject 5 Units into the skin daily. 08/31/21   Hosie Poisson, MD  Lido-PE-Glycerin-Petrolatum (PREPARATION H RAPID RELIEF) 5-0.25-14.4-15 % CREA Place 1 Application rectally every 6 (six) hours as needed (hemorrhoids).    [provider]  midodrine (PROAMATINE) 10 MG tablet Take 1 tablet (10 mg total) by mouth 3 (three) times daily with meals. Patient taking differently: Take 10 mg by mouth daily. 07/07/21   Thurnell Lose, MD  Multiple Vitamin (MULTIVITAMIN WITH MINERALS) TABS tablet Take 1 tablet by mouth daily. 09/01/21   Hosie Poisson, MD  pantoprazole (PROTONIX) 40 MG tablet Take 1 tablet (40 mg total) by mouth daily. Any generic PPI is okay to dispense Patient taking differently: Take 40 mg by mouth daily. 04/25/21   Rai, Ripudeep K, MD  polyethylene glycol (MIRALAX / GLYCOLAX) 17 g packet Take  17 g by mouth daily.    [provider]  rosuvastatin (CRESTOR) 40 MG tablet Take 1 tablet (40 mg total) by mouth daily. 04/25/21   Rai, Vernelle Emerald, MD  thiamine 100 MG tablet Take 1 tablet (100 mg total) by mouth daily. 09/01/21   Hosie Poisson, MD    Current Facility-Administered Medications  Medication Dose Route Frequency Provider Last Rate Last Admin   0.9 %  sodium chloride infusion (Manually program via Guardrails IV Fluids)   Intravenous Once Howerter, Justin B, DO       acetaminophen (TYLENOL) tablet 650 mg  650 mg Oral Q6H PRN Howerter, Justin B, DO       Or   acetaminophen (TYLENOL) suppository 650 mg  650 mg Rectal Q6H PRN Howerter, Justin B, DO       ezetimibe (ZETIA) tablet 10 mg  10 mg Oral Daily Howerter, Justin B, DO   10 mg at 09/10/21 1015   insulin aspart (novoLOG) injection 0-9 Units  0-9 Units Subcutaneous Q6H Howerter, Justin B, DO   1 Units at 09/10/21 0540   midodrine (PROAMATINE) tablet 10 mg  10 mg Oral Daily Howerter, Justin B, DO   10 mg at 09/10/21 0540   pantoprazole (PROTONIX) injection 40 mg  40 mg Intravenous Q24H Howerter, Justin B, DO   40 mg at 09/10/21 0753   rosuvastatin (CRESTOR) tablet 40 mg  40 mg Oral Daily Howerter, Justin B, DO   40 mg at 09/10/21 1011   thiamine tablet 100 mg  100 mg Oral Daily Howerter, Justin B, DO   100 mg at 09/10/21 1010   Current Outpatient Medications  Medication Sig Dispense Refill   aspirin EC 81 MG tablet Take 1 tablet (81 mg total) by mouth daily. Swallow whole. 30 tablet 11   blood glucose meter kit and supplies Dispense based on patient and insurance preference. Use up to four times daily as directed. (FOR ICD-10 E10.9, E11.9). 1 each 11   clotrimazole-betamethasone (LOTRISONE) cream Apply topically 2 (two) times daily. Apply to foreskin (Patient taking differently: Apply 1 application  topically 2 (two) times daily.) 45 g 4   Continuous Blood Gluc Sensor (FREESTYLE LIBRE 2 SENSOR) MISC 3 Units by Does not apply  route 3 (three) times daily with meals. 1 each 3   docusate sodium (COLACE) 100 MG capsule Take 100 mg by mouth at bedtime.     dronabinol (MARINOL)  2.5 MG capsule Take 1 capsule (2.5 mg total) by mouth 2 (two) times daily before lunch and supper for 10 days. 20 capsule 0   ezetimibe (ZETIA) 10 MG tablet Take 1 tablet (10 mg total) by mouth daily.     feeding supplement (ENSURE ENLIVE / ENSURE PLUS) LIQD Take 237 mLs by mouth 3 (three) times daily between meals. 21330 mL 2   HUMALOG KWIKPEN 100 UNIT/ML KwikPen Before each meal 3 times a day, 140-199 - 2 units, 200-250 - 4 units, 251-299 - 6 units,  300-349 - 8 units,  350 or above 10 units. (Patient taking differently: Inject 2-10 Units into the skin See admin instructions. Before each meal 3 times a day, 140-199 - 2 units, 200-250 - 4 units, 251-299 - 6 units,  300-349 - 8 units,  350 or above 10 units. Call MD <70 or >350) 15 mL 0   Insulin Glargine (BASAGLAR KWIKPEN) 100 UNIT/ML Inject 5 Units into the skin daily.     Lido-PE-Glycerin-Petrolatum (PREPARATION H RAPID RELIEF) 5-0.25-14.4-15 % CREA Place 1 Application rectally every 6 (six) hours as needed (hemorrhoids).     midodrine (PROAMATINE) 10 MG tablet Take 1 tablet (10 mg total) by mouth 3 (three) times daily with meals. (Patient taking differently: Take 10 mg by mouth daily.)     Multiple Vitamin (MULTIVITAMIN WITH MINERALS) TABS tablet Take 1 tablet by mouth daily.     pantoprazole (PROTONIX) 40 MG tablet Take 1 tablet (40 mg total) by mouth daily. Any generic PPI is okay to dispense (Patient taking differently: Take 40 mg by mouth daily.) 30 tablet 1   polyethylene glycol (MIRALAX / GLYCOLAX) 17 g packet Take 17 g by mouth daily.     rosuvastatin (CRESTOR) 40 MG tablet Take 1 tablet (40 mg total) by mouth daily. 30 tablet 3   thiamine 100 MG tablet Take 1 tablet (100 mg total) by mouth daily.      Allergies as of 09/09/2021   (No Known Allergies)    Social History   Socioeconomic  History   Marital status: Single    Spouse name: Not on file   Number of children: 0   Years of education: masters   Highest education level: Not on file  Occupational History   Occupation: semi retired/technology  Tobacco Use   Smoking status: Never   Smokeless tobacco: Never  Vaping Use   Vaping Use: Never used  Substance and Sexual Activity   Alcohol use: Not Currently    Comment: none in 6 months may open a bottle of wine tonight 11/1112022   Drug use: Never   Sexual activity: Not on file  Other Topics Concern   Not on file  Social History Narrative   Right handed-    Caffeine - 3 cups per day   Resides at [facility] on [place] Strain: Not on file  Food Insecurity: Not on file  Transportation Needs: Not on file  Physical Activity: Not on file  Stress: Not on file  Social Connections: Not on file  Intimate Partner Violence: Not on file    Review of Systems: All systems reviewed and negative except where noted in HPI.  Physical Exam: Vital signs in last 24 hours: Temp:  [97.6 F (36.4 C)] 97.6 F (36.4 C) (06/30 2341) Pulse Rate:  [62-97] 66 (07/01 1000) Resp:  [10-36] 13 (07/01 1000) BP: (85-142)/(56-86) 128/67 (07/01 1000) SpO2:  [  94 %-100 %] 100 % (07/01 1000) Weight:  [47.5 kg] 47.5 kg (06/30 2341)    General:  Alert thin male in NAD Psych:  Pleasant, cooperative. Normal mood and affect Eyes: Pupils equal, no icterus. Conjunctive pink Ears:  Normal auditory acuity Nose: No deformity, discharge or lesions Neck:  Supple, no masses felt Lungs:  Clear to auscultation.  Heart:  Regular rate, regular rhythm. No lower extremity edema Abdomen:  Soft, nondistended, nontender, active bowel sounds, no masses felt Rectal :  Incontinent of soft brown stool with slight rust tint. Swollen external / internal posterior hemorrhoid. Msk: Symmetrical without gross deformities.  Neurologic:  Alert,  oriented, grossly normal neurologically Skin:  Intact without significant lesions.    Intake/Output from previous day: 06/30 0701 - 07/01 0700 In: 999 [IV Piggyback:999] Out: -  Intake/Output this shift:  No intake/output data recorded.    Principal Problem:   Acute lower GI bleeding Active Problems:   DM2 (diabetes mellitus, type 2) ([place])   HLD (hyperlipidemia)   Anemia of chronic disease    Tye Savoy, NP-C @  09/10/2021, 10:18 AM  GI ATTENDING  History, laboratories, x-rays reviewed.  Patient seen and examined.  Agree with comprehensive consultation note as outlined above.  Patient recently hospitalized with rectal bleeding.  Significant findings were stercoral ulcer of the rectum and hemorrhoids.  He presents today with minor rectal bleeding.  Stool is brown with rust tinge.  Hemoglobin higher than discharge a few weeks ago.  His bleeding is almost certainly from either hemorrhoids and/or residual from stercoral ulcer (which should be healing).  Agree that the primary treatment for this is topical therapy as outlined.  Please keep his bowels soft.  May need MiraLAX to achieve daily bowel movements.  He is going to continue to have intermittent bleeding until topical therapies have time to take effect.  If his bleeding persists despite adequate medical therapies, he could see a colorectal surgeon regarding hemorrhoidectomy.  We will sign off.  Thanks  Docia Chuck. Geri Seminole., M.D. [facility] Division of Gastroenterology

## 2021-09-10 NOTE — ED Provider Notes (Incomplete)
[place] EMERGENCY DEPARTMENT Provider Note   CSN: [id number] Arrival date & time: 09/09/21  2334     History {Add pertinent medical, surgical, social history, OB history to HPI:1} Chief Complaint  Patient presents with  . GI Problem    Charles [name] is a 61 y.o. male.  The history is provided by the patient and medical records.  GI Problem     Home Medications Prior to Admission medications   Medication Sig Start Date End Date Taking? Authorizing Provider  aspirin EC 81 MG tablet Take 1 tablet (81 mg total) by mouth daily. Swallow whole. 09/07/21   Hosie Poisson, MD  blood glucose meter kit and supplies Dispense based on patient and insurance preference. Use up to four times daily as directed. (FOR ICD-10 E10.9, E11.9). 12/23/20   de [place], Blondell Reveal, MD  clotrimazole-betamethasone (LOTRISONE) cream Apply topically 2 (two) times daily. Apply to foreskin Patient taking differently: Apply 1 application  topically 2 (two) times daily. 04/25/21   Rai, Vernelle Emerald, MD  Continuous Blood Gluc Sensor (FREESTYLE LIBRE 2 SENSOR) MISC 3 Units by Does not apply route 3 (three) times daily with meals. 04/25/21   Rai, Vernelle Emerald, MD  docusate sodium (COLACE) 100 MG capsule Take 100 mg by mouth at bedtime.    [provider]  dronabinol (MARINOL) 2.5 MG capsule Take 1 capsule (2.5 mg total) by mouth 2 (two) times daily before lunch and supper for 10 days. 08/31/21 09/10/21  Hosie Poisson, MD  ezetimibe (ZETIA) 10 MG tablet Take 1 tablet (10 mg total) by mouth daily. 07/07/21   Thurnell Lose, MD  feeding supplement (ENSURE ENLIVE / ENSURE PLUS) LIQD Take 237 mLs by mouth 3 (three) times daily between meals. 08/31/21 11/29/21  Hosie Poisson, MD  HUMALOG KWIKPEN 100 UNIT/ML KwikPen Before each meal 3 times a day, 140-199 - 2 units, 200-250 - 4 units, 251-299 - 6 units,  300-349 - 8 units,  350 or above 10 units. Patient taking differently: Inject 2-10 Units into the skin See  admin instructions. Before each meal 3 times a day, 140-199 - 2 units, 200-250 - 4 units, 251-299 - 6 units,  300-349 - 8 units,  350 or above 10 units. Call MD <70 or >350 07/07/21   Thurnell Lose, MD  Insulin Glargine [facility]) 100 UNIT/ML Inject 5 Units into the skin daily. 08/31/21   Hosie Poisson, MD  Lido-PE-Glycerin-Petrolatum (PREPARATION H RAPID RELIEF) 5-0.25-14.4-15 % CREA Place 1 Application rectally every 6 (six) hours as needed (hemorrhoids).    [provider]  midodrine (PROAMATINE) 10 MG tablet Take 1 tablet (10 mg total) by mouth 3 (three) times daily with meals. Patient taking differently: Take 10 mg by mouth daily. 07/07/21   Thurnell Lose, MD  Multiple Vitamin (MULTIVITAMIN WITH MINERALS) TABS tablet Take 1 tablet by mouth daily. 09/01/21   Hosie Poisson, MD  pantoprazole (PROTONIX) 40 MG tablet Take 1 tablet (40 mg total) by mouth daily. Any generic PPI is okay to dispense Patient taking differently: Take 40 mg by mouth daily. 04/25/21   Rai, Ripudeep K, MD  polyethylene glycol (MIRALAX / GLYCOLAX) 17 g packet Take 17 g by mouth daily.    [provider]  rosuvastatin (CRESTOR) 40 MG tablet Take 1 tablet (40 mg total) by mouth daily. 04/25/21   Rai, Vernelle Emerald, MD  thiamine 100 MG tablet Take 1 tablet (100 mg total) by mouth daily. 09/01/21   Hosie Poisson,  MD      Allergies    Patient has no known allergies.    Review of Systems   Review of Systems  Physical Exam Updated Vital Signs BP 112/72   Pulse 97   Temp 97.6 F (36.4 C) (Oral)   Resp 16   Ht $R'5\' 7"'SD$  (1.702 m)   Wt 47.5 kg   SpO2 94%   BMI 16.40 kg/m  Physical Exam  ED Results / Procedures / Treatments   Labs (all labs ordered are listed, but only abnormal results are displayed) Labs Reviewed  CBC WITH DIFFERENTIAL/PLATELET  COMPREHENSIVE METABOLIC PANEL  AMMONIA  PROTIME-INR  APTT  POC OCCULT BLOOD, ED  TYPE AND SCREEN    EKG None  Radiology No results  found.  Procedures Procedures  {Document cardiac monitor, telemetry assessment procedure when appropriate:1}  Medications Ordered in ED Medications - No data to display  ED Course/ Medical Decision Making/ A&P                           Medical Decision Making Amount and/or Complexity of Data Reviewed Labs: ordered. ECG/medicine tests: ordered.   ***  {Document critical care time when appropriate:1} {Document review of labs and clinical decision tools ie heart score, Chads2Vasc2 etc:1}  {Document your independent review of radiology images, and any outside records:1} {Document your discussion with family members, caretakers, and with consultants:1} {Document social determinants of health affecting pt's care:1} {Document your decision making why or why not admission, treatments were needed:1} Final Clinical Impression(s) / ED Diagnoses Final diagnoses:  None    Rx / DC Orders ED Discharge Orders     None

## 2021-09-11 DIAGNOSIS — E785 Hyperlipidemia, unspecified: Secondary | ICD-10-CM | POA: Diagnosis not present

## 2021-09-11 DIAGNOSIS — D638 Anemia in other chronic diseases classified elsewhere: Secondary | ICD-10-CM | POA: Diagnosis not present

## 2021-09-11 DIAGNOSIS — E119 Type 2 diabetes mellitus without complications: Secondary | ICD-10-CM | POA: Diagnosis not present

## 2021-09-11 DIAGNOSIS — K922 Gastrointestinal hemorrhage, unspecified: Secondary | ICD-10-CM | POA: Diagnosis not present

## 2021-09-11 LAB — BASIC METABOLIC PANEL
Anion gap: 13 (ref 5–15)
BUN: 11 mg/dL (ref 6–20)
CO2: 21 mmol/L — ABNORMAL LOW (ref 22–32)
Calcium: 9 mg/dL (ref 8.9–10.3)
Chloride: 102 mmol/L (ref 98–111)
Creatinine, Ser: 0.7 mg/dL (ref 0.61–1.24)
GFR, Estimated: >60 mL/min (ref >60)
Glucose, Bld: 158 mg/dL — ABNORMAL HIGH (ref 70–99)
Potassium: 3.9 mmol/L (ref 3.5–5.1)
Sodium: 136 mmol/L (ref 135–145)

## 2021-09-11 LAB — MAGNESIUM: Magnesium: 1.6 mg/dL — ABNORMAL LOW (ref 1.7–2.4)

## 2021-09-11 LAB — GLUCOSE, CAPILLARY
Glucose-Capillary: 118 mg/dL — ABNORMAL HIGH (ref 70–99)
Glucose-Capillary: 126 mg/dL — ABNORMAL HIGH (ref 70–99)
Glucose-Capillary: 160 mg/dL — ABNORMAL HIGH (ref 70–99)

## 2021-09-11 LAB — HEMOGLOBIN AND HEMATOCRIT, BLOOD
HCT: 31.4 % — ABNORMAL LOW (ref 39.0–52.0)
Hemoglobin: 9.8 g/dL — ABNORMAL LOW (ref 13.0–17.0)

## 2021-09-11 MED ORDER — DOCUSATE SODIUM 100 MG PO CAPS: 100.0000 mg | ORAL_CAPSULE | Freq: Every day | ORAL | 0 refills | Status: AC

## 2021-09-11 MED ORDER — ORAL CARE MOUTH RINSE: 15.0000 mL | OROMUCOSAL | Status: DC | PRN

## 2021-09-11 MED ORDER — HYDROCORTISONE (PERIANAL) 2.5 % EX CREA: TOPICAL_CREAM | Freq: Two times a day (BID) | CUTANEOUS | 2 refills | Status: DC

## 2021-09-11 MED ORDER — POLYETHYLENE GLYCOL 3350 17 G PO PACK: 17.0000 g | PACK | Freq: Every day | ORAL | Status: AC

## 2021-09-11 MED ORDER — CHLORHEXIDINE GLUCONATE CLOTH 2 % EX PADS
6.0000 | MEDICATED_PAD | Freq: Every day | CUTANEOUS | Status: DC
  Administered 2021-09-11: 6 via TOPICAL

## 2021-09-11 MED ORDER — ASPIRIN EC 81 MG PO TBEC: 81.0000 mg | DELAYED_RELEASE_TABLET | Freq: Every day | ORAL | 11 refills | Status: AC

## 2021-09-11 NOTE — TOC Transition Note (Signed)
Transition of Care [facility]) - CM/SW Discharge Note   Patient Details  Name: Charles [name] MRN: [id number] Date of Birth: Jan 10, 1961  Transition [facility] Care [facility]) CM/SW Contact:  Deatra Robinson, [place] Phone Number: 09/11/2021, 11:02 AM   Clinical Narrative:  Pt for dc back to [facility] today. Spoke to [place] at [facility] who confirmed they are prepared to admit pt to room 138A. Voicemail left for pt's sister notifying her of dc. Packet complete and RN provided with number for report. SW signing off at dc.   Dellie Burns, MSW, LCSW [phone] (coverage)       Final next level of care: Skilled Nursing Facility Barriers to Discharge: No Barriers Identified   Patient Goals and CMS Choice        Discharge Placement                Patient to be transferred to facility by: [facility] Name of family member notified: [name]/guardian Patient and family notified of of transfer: 09/11/21  Discharge Plan and Services                                     Social Determinants of Health (SDOH) Interventions     Readmission Risk Interventions    07/04/2021    2:49 PM  Readmission Risk Prevention Plan  Transportation Screening Complete  PCP or Specialist Appt within 5-7 Days Complete  Home Care Screening Complete

## 2021-09-11 NOTE — Discharge Summary (Signed)
Physician Discharge Summary  Charles [name] IDP:[id number] DOB: 1960/08/26 DOA: 09/09/2021  PCP: Darreld Mclean, MD  Admit date: 09/09/2021 Discharge date: 09/11/2021  Admitted From: Skilled nursing facility  Discharge disposition: Skilled nursing facility   Recommendations for Outpatient Follow-Up:   Follow up with your primary care provider at the skilled nursing facility in 3 to 5 days. Check CBC, BMP, magnesium in the next visit If patient continues to have rectal bleed might need a colorectal surgeon.  His bleeding is likely from stercoral ulcer and hemorrhoids.   Discharge Diagnosis:   Principal Problem:   Acute lower GI bleeding Active Problems:   DM2 (diabetes mellitus, type 2) (HCC)   HLD (hyperlipidemia)   Anemia of chronic disease   Discharge Condition: Improved.  Diet recommendation:  Carbohydrate-modified.    Wound care: None.  Code status: Full.   History of Present Illness:  Patient is a 61 years old male with past medical history of diabetes mellitus type 2, hyperlipidemia, anemia of chronic disease with baseline hemoglobin around 10-12 was admitted to hospital with complaints of bright red blood per rectum.  During his previous hospitalization on 08/21/2021 he underwent EGD and colonoscopy which showed diverticula ulceration associated with the rectum and underwent polypectomy.  On this admission patient had stable vitals.  Creatinine of 0.6.  Hemoglobin was 10.7.  GI was consulted from the ED and patient was admitted to hospital for overnight observation.  Hospital Course:   Following conditions were addressed during hospitalization as listed below,  Lower GI bleed.  Mostly spotting.  Likely from stercoral ulcer and hemorrhoids.  Steroid suppository has been recommended by GI which is the main form of treatment at this time.  Hemoglobin has largely remained stable and patient was hemodynamically stable.  Patient did have recent. colonoscopy which was  notable for diverticula and ulceration with polypectomy at the time of colonoscopy.  Continue Protonix.  Will need to monitor CBC as outpatient.  Anemia of chronic disease with rectal bleed..  Latest hemoglobin of 9.8 we will continue to monitor as outpatient..   Type 2 diabetes mellitus.  On insulin glargine at home including sliding scale.  Recent hemoglobin A1c of 10.4.  Continue on discharge.  Hyperlipidemia.  Continue Crestor and Zetia.  Disposition.  At this time, patient is stable for disposition to skilled nursing facility.  Medical Consultants:   GI  Procedures:    None Subjective:   Today, patient was seen and examined at bedside.  Had mild spotting from the rectal area while having a bowel movement.  Denies any dizziness lightheadedness shortness of breath.  Discharge Exam:   Vitals:   09/11/21 0453 09/11/21 0522  BP: (!) 87/63 (!) 96/58  Pulse: 71   Resp: 16   Temp: 98 F (36.7 C)   SpO2: 100%    Vitals:   09/10/21 1400 09/10/21 1635 09/11/21 0453 09/11/21 0522  BP: 106/77 113/85 (!) 87/63 (!) 96/58  Pulse: 77 74 71   Resp: $Remo'15 19 16   'RQnVn$ Temp:  98 F (36.7 C) 98 F (36.7 C)   TempSrc:      SpO2: 100% 100% 100%   Weight:      Height:        General: Alert awake, not in obvious distress, thinly built HENT: pupils equally reacting to light, mild pallor noted.  Oral mucosa is moist.  Chest:  Clear breath sounds.  Diminished breath sounds bilaterally. No crackles or wheezes.  CVS: S1 &S2 heard. No murmur.  Regular rate and rhythm. Abdomen: Soft, nontender, nondistended.  Bowel sounds are heard.  Rectal examination was not done Extremities: No cyanosis, clubbing or edema.  Peripheral pulses are palpable. Psych: Alert, awake and oriented, normal mood CNS:  No cranial nerve deficits.  Power equal in all extremities.   Skin: Warm and dry.  No rashes noted.  The results of significant diagnostics from this hospitalization (including imaging, microbiology,  ancillary and laboratory) are listed below for reference.     Diagnostic Studies:   No results found.   Labs:   Basic Metabolic Panel: Recent Labs  Lab 09/09/21 2346 09/10/21 0428 09/11/21 0550  NA 136 135 136  K 3.8 3.9 3.9  CL 101 101 102  CO2 25 24 21*  GLUCOSE 152* 165* 158*  BUN $Re'14 14 11  'ThX$ CREATININE 0.65 0.66 0.70  CALCIUM 9.3 9.0 9.0  MG  --  1.7 1.6*   GFR Estimated Creatinine Clearance: 67.6 mL/min (by C-G formula based on SCr of 0.7 mg/dL). Liver Function Tests: Recent Labs  Lab 09/09/21 2346 09/10/21 0428  AST 21 22  ALT 17 15  ALKPHOS 102 94  BILITOT 0.4 0.4  PROT 6.5 6.4*  ALBUMIN 2.8* 2.6*   No results for input(s): "LIPASE", "AMYLASE" in the last 168 hours. Recent Labs  Lab 09/10/21 0110  AMMONIA <10   Coagulation profile Recent Labs  Lab 09/09/21 2346  INR 1.0    CBC: Recent Labs  Lab 09/09/21 2346 09/10/21 0428 09/10/21 0949 09/10/21 1317 09/10/21 1733 09/11/21 0550  WBC 7.9 9.8  --   --   --   --   NEUTROABS 5.6 7.6  --   --   --   --   HGB 10.7* 10.4* 9.4* 10.8* 10.6* 9.8*  HCT 33.1* 32.0* 30.0* 33.0* 32.4* 31.4*  MCV 88.3 89.4  --   --   --   --   PLT 530* 436*  --   --   --   --    Cardiac Enzymes: No results for input(s): "CKTOTAL", "CKMB", "CKMBINDEX", "TROPONINI" in the last 168 hours. BNP: Invalid input(s): "POCBNP" CBG: Recent Labs  Lab 09/10/21 0753 09/10/21 1214 09/10/21 1633 09/11/21 0046 09/11/21 0602  GLUCAP 127* 146* 165* 160* 126*   D-Dimer No results for input(s): "DDIMER" in the last 72 hours. Hgb A1c No results for input(s): "HGBA1C" in the last 72 hours. Lipid Profile No results for input(s): "CHOL", "HDL", "LDLCALC", "TRIG", "CHOLHDL", "LDLDIRECT" in the last 72 hours. Thyroid function studies No results for input(s): "TSH", "T4TOTAL", "T3FREE", "THYROIDAB" in the last 72 hours.  Invalid input(s): "FREET3" Anemia work up No results for input(s): "VITAMINB12", "FOLATE", "FERRITIN",  "TIBC", "IRON", "RETICCTPCT" in the last 72 hours. Microbiology No results found for this or any previous visit (from the past 240 hour(s)).   Discharge Instructions:   Discharge Instructions     Call MD for:  persistant nausea and vomiting   Complete by: As directed    Call MD for:  severe uncontrolled pain   Complete by: As directed    Diet Carb Modified   Complete by: As directed    Discharge instructions   Complete by: As directed    Follow-up with your primary care provider in 1 week.  Check blood work at that time.  Use a suppository as prescribed.  Seek medical attention for worsening symptoms.   Increase activity slowly   Complete by: As directed       Allergies as of 09/11/2021  No Known Allergies      Medication List     STOP taking these medications    clotrimazole 1 % external solution Commonly known as: LOTRIMIN   clotrimazole-betamethasone cream Commonly known as: LOTRISONE   dronabinol 2.5 MG capsule Commonly known as: MARINOL   HumaLOG KwikPen 100 UNIT/ML KwikPen Generic drug: insulin lispro       TAKE these medications    aspirin EC 81 MG tablet Take 1 tablet (81 mg total) by mouth daily. Swallow whole. Start taking on: September 14, 2021 What changed: These instructions start on September 14, 2021. If you are unsure what to do until then, ask your doctor or other care provider.   blood glucose meter kit and supplies Dispense based on patient and insurance preference. Use up to four times daily as directed. (FOR ICD-10 E10.9, E11.9).   docusate sodium 100 MG capsule Commonly known as: COLACE Take 1 capsule (100 mg total) by mouth at bedtime.   ezetimibe 10 MG tablet Commonly known as: ZETIA Take 1 tablet (10 mg total) by mouth daily.   feeding supplement Liqd Take 237 mLs by mouth 3 (three) times daily between meals.   hydrocortisone 2.5 % rectal cream Commonly known as: ANUSOL-HC Place rectally 2 (two) times daily.   Lantus SoloStar 100  UNIT/ML Solostar Pen Generic drug: insulin glargine Inject 5 Units into the skin daily. What changed: Another medication with the same name was removed. Continue taking this medication, and follow the directions you see here.   midodrine 10 MG tablet Commonly known as: PROAMATINE Take 1 tablet (10 mg total) by mouth 3 (three) times daily with meals. What changed: when to take this   multivitamin with minerals Tabs tablet Take 1 tablet by mouth daily.   pantoprazole 40 MG tablet Commonly known as: PROTONIX Take 1 tablet (40 mg total) by mouth daily. Any generic PPI is okay to dispense What changed: additional instructions   polyethylene glycol 17 g packet Commonly known as: MIRALAX / GLYCOLAX Take 17 g by mouth daily. What changed:  when to take this reasons to take this   Preparation H Rapid Relief 5-0.25-14.4-15 % Crea Place 1 Application rectally every 6 (six) hours as needed (hemorrhoids).   rosuvastatin 40 MG tablet Commonly known as: CRESTOR Take 1 tablet (40 mg total) by mouth daily.   thiamine 100 MG tablet Take 1 tablet (100 mg total) by mouth daily.        Follow-up Information     [name], [name], MD Follow up.   Specialty: Family Medicine Contact information: [address] [place] [address] [phone]         Buford Dresser, MD .   Specialty: Cardiology Contact information: [address] [place] [address] [phone]                  Time coordinating discharge: 39 minutes  Signed:  Sharisse Rantz  [facility] 09/11/2021, 9:02 AM

## 2021-09-11 NOTE — Progress Notes (Signed)
Report called to [place] nurse at [facility], piv d/c, sister Maxine Glenn called and notified of the transfer. No distress noted. Foley in place, pt on room air.

## 2021-09-12 LAB — BPAM RBC
Blood Product Expiration Date: 202307102359
Unit Type and Rh: 6200

## 2021-09-12 LAB — TYPE AND SCREEN
ABO/RH(D): A POS
Antibody Screen: NEGATIVE
Unit division: 0

## 2021-10-24 ENCOUNTER — Ambulatory Visit (INDEPENDENT_AMBULATORY_CARE_PROVIDER_SITE_OTHER): Payer: Commercial Managed Care - HMO | Admitting: Cardiology

## 2021-10-24 ENCOUNTER — Encounter (HOSPITAL_BASED_OUTPATIENT_CLINIC_OR_DEPARTMENT_OTHER): Payer: Self-pay | Admitting: Cardiology

## 2021-10-24 VITALS — BP 80/60 | HR 81 | Ht 67.0 in | Wt 96.0 lb

## 2021-10-24 DIAGNOSIS — R531 Weakness: Secondary | ICD-10-CM

## 2021-10-24 DIAGNOSIS — R627 Adult failure to thrive: Secondary | ICD-10-CM

## 2021-10-24 DIAGNOSIS — Z951 Presence of aortocoronary bypass graft: Secondary | ICD-10-CM

## 2021-10-24 DIAGNOSIS — Z794 Long term (current) use of insulin: Secondary | ICD-10-CM

## 2021-10-24 DIAGNOSIS — I2510 Atherosclerotic heart disease of native coronary artery without angina pectoris: Secondary | ICD-10-CM

## 2021-10-24 DIAGNOSIS — I959 Hypotension, unspecified: Secondary | ICD-10-CM

## 2021-10-24 DIAGNOSIS — E118 Type 2 diabetes mellitus with unspecified complications: Secondary | ICD-10-CM

## 2021-10-24 NOTE — Patient Instructions (Signed)
Medication Instructions:  Your Physician recommend you continue on your current medication as directed.    *If you need a refill on your cardiac medications before your next appointment, please call your pharmacy*  Follow-Up: At [facility], you and your health needs are our priority.  As part of our continuing mission to provide you with exceptional heart care, we have created designated Provider Care Teams.  These Care Teams include your primary Cardiologist (physician) and Advanced Practice Providers (APPs -  Physician Assistants and Nurse Practitioners) who all work together to provide you with the care you need, when you need it.  We recommend signing up for the patient portal called "MyChart".  Sign up information is provided on this After Visit Summary.  MyChart is used to connect with patients for Virtual Visits (Telemedicine).  Patients are able to view lab/test results, encounter notes, upcoming appointments, etc.  Non-urgent messages can be sent to your provider as well.   To learn more about what you can do with MyChart, go to [email or web address]://[email or web address].com.    Your next appointment:   1 year(s)  The format for your next appointment:   In Person  Provider:   Bridgette Christopher, MD{  Other Instructions Heart Healthy Diet Recommendations: A low-salt diet is recommended. Meats should be grilled, baked, or boiled. Avoid fried foods. Focus on lean protein sources like fish or chicken with vegetables and fruits. The American Heart Association is a GREAT resource!  American Heart Association Diet and Lifeystyle Recommendations   Exercise recommendations: The American Heart Association recommends 150 minutes of moderate intensity exercise weekly. Try 30 minutes of moderate intensity exercise 4-5 times per week. This could include walking, jogging, or swimming.   Important Information About Sugar       

## 2021-10-24 NOTE — Progress Notes (Signed)
Cardiology Office Note:    Date:  10/24/2021   ID:  Charles [name], DOB Oct 31, 1960, MRN [id number]  PCP:  Darreld Mclean, MD  Cardiologist:  Buford Dresser, MD  CC: Follow-up  History of Present Illness:    Charles [name] is a 61 y.o. male with a hx of hypertension, diabetes, and arthritis, who is seen for follow-up. I initially met him 03/22/2021 as a new consult at the request of Dr. [place] for the evaluation and management of hypertension and coronary artery disease.  Cardiovascular risk factors: Prior clinical ASCVD: CAD s/p CABG in 2004 Comorbid conditions: Diabetes- First diagnosed as type 2 one year following CABG x4 in 2004, In 2022 he was told he had type 1 diabetes. Previously, he was off of Insulin and lost 20 lbs. He has gained some weight since restarting Insulin. Hypertension - Diagnosed 3 years  ago. Not controlled by medication currently, previously was on lisinopril. Exercise level: He states he is generally not able to walk due to imbalance, and he becomes short of breath with minimal exertion. This is concerning for him because he is no longer able to exercise, work, or walk without issues. He walks with a cane for assistance. He notes having extreme weakness, and getting out of bed in the morning or getting into his car can be difficult and painful. Also, he has fallen quite a few times. He no longer travels for work after he had a severe fall in a hotel room. 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   Hospital admission 04/21/2021 with profound diabetic ketoacidosis, nausea, vomiting, and dehydration.  Patient discharged from hospital on 07/07/21. Discharge summary and hospital course reviewed. He was admitted from 4/20-4/27/23 for acute metabolic encephalopathy. He was found at home by home health, was confused, hypotensive, and hypothermic. Testing suggested sepsis from UTI from chronic indwelling foley catheter.    At his last appointment, he was in skilled nursing at [facility]. Working with physical therapy. Has long term foley catheter. He had a fall one day prior, hit a wall. Did not lose consciousness. However, he did complain of feeling like he is going to pass out "all of the time". He was not eating/drinking well, not much appetite. Sugars had been well controlled. Weight down 10 lbs. Felt like he didn't drink much water. Discussed Boost and Ensure, he hadn't tried these, will recommend.  Today: At this time he denies any recurrent GI bleeding issues. This seems to have resolved since his recent hospital admission.   He confirms that lately he has struggled with controlling his diabetes and blood pressure. Right now he feels dizziness and feels like he may pass out. He has had nothing to eat or drink yet today; he states he was rushed to this appointment this morning.  He denies any palpitations, chest pain, shortness of breath, or peripheral edema. No headaches, orthopnea, or PND.   Past Medical History:  Diagnosis Date   Arthritis    Diabetes mellitus without complication ([place])    Heart disease    Hypertension     Past Surgical History:  Procedure Laterality Date   COLONOSCOPY WITH PROPOFOL N/A 08/21/2021   Procedure: COLONOSCOPY WITH PROPOFOL;  Surgeon: Daryel November, MD;  Location: [facility] ENDOSCOPY;  Service: Gastroenterology;  Laterality: N/A;   CORONARY ARTERY BYPASS GRAFT     ESOPHAGOGASTRODUODENOSCOPY (EGD) WITH PROPOFOL N/A 08/21/2021   Procedure: ESOPHAGOGASTRODUODENOSCOPY (EGD) WITH PROPOFOL;  Surgeon:  Daryel November, MD;  Location: [facility] ENDOSCOPY;  Service: Gastroenterology;  Laterality: N/A;   POLYPECTOMY  08/21/2021   Procedure: POLYPECTOMY;  Surgeon: Daryel November, MD;  Location: [facility] ENDOSCOPY;  Service: Gastroenterology;;    Current Medications: Current Outpatient Medications on File Prior to Visit  Medication Sig   aspirin EC 81 MG tablet Take 1 tablet  (81 mg total) by mouth daily. Swallow whole.   blood glucose meter kit and supplies Dispense based on patient and insurance preference. Use up to four times daily as directed. (FOR ICD-10 E10.9, E11.9).   docusate sodium (COLACE) 100 MG capsule Take 1 capsule (100 mg total) by mouth at bedtime.   dronabinol (MARINOL) 2.5 MG capsule Take 2.5 mg by mouth 2 (two) times daily before a meal.   ezetimibe (ZETIA) 10 MG tablet Take 1 tablet (10 mg total) by mouth daily.   feeding supplement (ENSURE ENLIVE / ENSURE PLUS) LIQD Take 237 mLs by mouth 3 (three) times daily between meals.   hydrocortisone (ANUSOL-HC) 2.5 % rectal cream Place rectally 2 (two) times daily.   insulin glargine (LANTUS SOLOSTAR) 100 UNIT/ML Solostar Pen Inject 5 Units into the skin daily.   Lido-PE-Glycerin-Petrolatum (PREPARATION H RAPID RELIEF) 5-0.25-14.4-15 % CREA Place 1 Application rectally every 6 (six) hours as needed (hemorrhoids).   midodrine (PROAMATINE) 10 MG tablet Take 1 tablet (10 mg total) by mouth 3 (three) times daily with meals.   Multiple Vitamin (MULTIVITAMIN WITH MINERALS) TABS tablet Take 1 tablet by mouth daily.   pantoprazole (PROTONIX) 40 MG tablet Take 1 tablet (40 mg total) by mouth daily. Any generic PPI is okay to dispense (Patient taking differently: Take 40 mg by mouth daily.)   polyethylene glycol (MIRALAX / GLYCOLAX) 17 g packet Take 17 g by mouth daily.   rosuvastatin (CRESTOR) 40 MG tablet Take 1 tablet (40 mg total) by mouth daily.   thiamine 100 MG tablet Take 1 tablet (100 mg total) by mouth daily.   No current facility-administered medications on file prior to visit.     Allergies:   Patient has no known allergies.   Social History   Tobacco Use   Smoking status: Never   Smokeless tobacco: Never  Vaping Use   Vaping Use: Never used  Substance Use Topics   Alcohol use: Not Currently    Comment: none in 6 months may open a bottle of wine tonight 11/1112022   Drug use: Never     Family History: family history includes Diabetes in his father, mother, sister, and sister; Heart attack in his father; Hypertension in his father and mother.  Severe, Mother and Father died of heart issues. "Everyone" in his family has hyperlipidemia.  ROS:   Please see the history of present illness. (+) Dizziness (+) Presyncope All other systems are reviewed and negative.    EKGs/Labs/Other Studies Reviewed:    The following studies were reviewed today:  CT Chest/Abdomen/Pelvis  07/03/2021: IMPRESSION: 1. Severe hepatic steatosis. 2. Small volume of ascites and diffuse mesenteric edema. 3. Small bilateral pleural effusions lying dependently with some passive subsegmental atelectasis in the lower lobes of the lungs bilaterally. 4. Aortic atherosclerosis, in addition to left main and three-vessel coronary artery disease. Status post median sternotomy for CABG including LIMA to the LAD.  Echo 03/24/2021: Sonographer Comments: Suboptimal parasternal window and suboptimal  subcostal window.  IMPRESSIONS    1. Left ventricular ejection fraction, by estimation, is 60 to 65%. The  left ventricle has normal function. The  left ventricle has no regional  wall motion abnormalities. Left ventricular diastolic parameters are  consistent with Grade I diastolic  dysfunction (impaired relaxation).   2. Right ventricular systolic function is normal. The right ventricular  size is mildly enlarged.   3. The mitral valve is normal in structure. No evidence of mitral valve  regurgitation. No evidence of mitral stenosis.   4. The aortic valve is normal in structure. Aortic valve regurgitation is  not visualized. No aortic stenosis is present.   5. The inferior vena cava is normal in size with greater than 50%  respiratory variability, suggesting right atrial pressure of 3 mmHg.   EKG:  EKG is personally reviewed.   10/24/2021:  EKG was not ordered. 05/04/2021: NSR at 85 bpm with t wave  abnormalities as noted previously 03/22/2021: NSR at 94 bpm, abnormal T wave in inferolateral leads, no prior  Recent Labs: 07/06/2021: B Natriuretic Peptide 115.1 08/22/2021: TSH 3.784 09/10/2021: ALT 15; Platelets 436 09/11/2021: BUN 11; Creatinine, Ser 0.70; Hemoglobin 9.8; Magnesium 1.6; Potassium 3.9; Sodium 136   Recent Lipid Panel    Component Value Date/Time   CHOL 138 07/03/2021 0142   CHOL 328 (H) 12/23/2020 1140   TRIG 95 07/03/2021 0142   HDL 34 (L) 07/03/2021 0142   HDL 46 12/23/2020 1140   CHOLHDL 4.1 07/03/2021 0142   VLDL 19 07/03/2021 0142   LDLCALC 85 07/03/2021 0142   LDLCALC 231 (H) 12/23/2020 1140    Physical Exam:    VS:  BP (!) 80/60 (BP Location: Right Arm, Patient Position: Sitting, Cuff Size: Normal)   Pulse 81   Ht $R'5\' 7"'vs$  (1.702 m)   Wt 96 lb (43.5 kg) Comment: verbal  SpO2 100%   BMI 15.04 kg/m     Wt Readings from Last 3 Encounters:  10/24/21 96 lb (43.5 kg)  09/09/21 104 lb 11.5 oz (47.5 kg)  08/31/21 104 lb 11.5 oz (47.5 kg)    GEN: frail, muscle wasting, in no acute distress NECK: No JVD CARDIAC: regular rhythm, normal S1 and S2, no rubs or gallops. No murmur. VASCULAR: Radial pulses 2+ bilaterally.  RESPIRATORY:  Clear to auscultation without rales, wheezing or rhonchi  ABDOMEN: Soft, non-tender, non-distended MUSCULOSKELETAL:  Moves all 4 limbs independently but requires wheelchair. SKIN: Warm and dry, no edema NEUROLOGIC:  No focal neuro deficits noted. PSYCHIATRIC:  Normal affect    ASSESSMENT:    1. FTT (failure to thrive) in adult   2. Generalized weakness   3. Type 2 diabetes mellitus with complication, with long-term current use of insulin ([place])   4. Coronary artery disease involving native coronary artery of native heart without angina pectoris   5. Hx of CABG   6. Hypotension, unspecified hypotension type     PLAN:    Fatigue, generalized weakness, frequent falls, multiple recent hospitalizations -has had precipitous  decline. Has been seen by neurology, endocrinology. Follows with Dr. Lorelei Pont -currently in skilled nursing. He is wheelchair bound today and reports no strength, trys to work with PT -he endorses poor appetite, has 10 lb weight loss. Overall he continues to decline, consistent with adult failure to thrive  Hypotension: -now on midodrine TID, on no antihypertensives -this plus bladder issues concerning for neuropathy -encouraged oral intake, PT, and continued midodrine  History of CAD History of 4V CABG 2004 at [facility] in [place]. 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 Now following with Dr. Chalmers Cater.  -A1c 12/2020 >15.5 -A1c 04/22/21 10.4 -A1c 07/03/21 10.4 -admission for DKA 04/2021  Cardiac risk counseling and prevention recommendations: -recommend heart healthy/Mediterranean diet, with whole grains, fruits, vegetable, fish, lean meats, nuts, and olive oil. Limit salt. -recommend moderate walking, 3-5 times/week for 30-50 minutes each session. Aim for at least 150 minutes.week. Goal should be pace of 3 miles/hours, or walking 1.5 miles in 30 minutes -recommend avoidance of tobacco products. Avoid excess alcohol.    Overall he continues to have precipitous decline. His issues have not been cardiac, but we discussed signs/symptoms related to his heart for which he should contact me. I am available to his team for any questions or concerns.  Plan for follow up: 1 year or sooner as needed.  Buford Dresser, MD, PhD, [facility]    Medication Adjustments/Labs and Tests Ordered: Current medicines are reviewed at length with the patient today.  Concerns regarding medicines are outlined above.   No orders of the defined types were placed in this encounter.  No orders of the defined types were placed in this encounter.  Patient Instructions  Medication Instructions:  Your Physician recommend you continue on your current medication as directed.    *If you need a refill on your cardiac medications before your next appointment, please call your pharmacy*  Follow-Up: At [facility], you and your health needs are our priority.  As part of our continuing mission to provide you with exceptional heart care, we have created designated Provider Care Teams.  These Care Teams include your primary Cardiologist (physician) and Advanced Practice Providers (APPs -  Physician Assistants and Nurse Practitioners) who all work together to provide you with the care you need, when you need it.  We recommend signing up for the patient portal called "MyChart".  Sign up information is provided on this After Visit Summary.  MyChart is used to connect with patients for Virtual Visits (Telemedicine).  Patients are able to view lab/test results, encounter notes, upcoming appointments, etc.  Non-urgent messages can be sent to your provider as well.   To learn more about what you can do with MyChart, go to [email or web address].    Your next appointment:   1 year(s)  The format for your next appointment:   In Person  Provider:   Buford Dresser, MD{  Other Instructions Heart Healthy Diet Recommendations: A low-salt diet is recommended. Meats should be grilled, baked, or boiled. Avoid fried foods. Focus on lean protein sources like fish or chicken with vegetables and fruits. The American Heart Association is a Microbiologist!  American Heart Association Diet and Lifeystyle Recommendations   Exercise  recommendations: The American Heart Association recommends 150 minutes of moderate intensity exercise weekly. Try 30 minutes of moderate intensity exercise 4-5 times per week. This could include walking, jogging, or swimming.   Important Information About Sugar         I,Mathew Stumpf,acting as a scribe for [employer], MD.,have documented all relevant documentation on the behalf of Buford Dresser, MD,as directed by  Buford Dresser, MD while in the presence of [place],  MD.  Deetta Perla, MD, have reviewed all documentation for this visit. The documentation on 11/28/21 for the exam, diagnosis, procedures, and orders are all accurate and complete.   Signed, Buford Dresser, MD PhD 10/24/2021     [place]

## 2021-11-28 ENCOUNTER — Encounter (HOSPITAL_BASED_OUTPATIENT_CLINIC_OR_DEPARTMENT_OTHER): Payer: Self-pay | Admitting: Cardiology

## 2021-11-29 ENCOUNTER — Inpatient Hospital Stay (HOSPITAL_COMMUNITY)
Admission: EM | Admit: 2021-11-29 | Discharge: 2021-12-05 | DRG: 698 | Disposition: A | Discharge status: Skilled Nursing Facility | Payer: Commercial Managed Care - HMO | Source: Skilled Nursing Facility | Attending: Family Medicine | Admitting: Family Medicine

## 2021-11-29 ENCOUNTER — Emergency Department (HOSPITAL_COMMUNITY): Payer: Commercial Managed Care - HMO

## 2021-11-29 DIAGNOSIS — N3000 Acute cystitis without hematuria: Secondary | ICD-10-CM

## 2021-11-29 DIAGNOSIS — Z8249 Family history of ischemic heart disease and other diseases of the circulatory system: Secondary | ICD-10-CM

## 2021-11-29 DIAGNOSIS — E785 Hyperlipidemia, unspecified: Secondary | ICD-10-CM | POA: Diagnosis present

## 2021-11-29 DIAGNOSIS — N390 Urinary tract infection, site not specified: Secondary | ICD-10-CM

## 2021-11-29 DIAGNOSIS — T83518A Infection and inflammatory reaction due to other urinary catheter, initial encounter: Secondary | ICD-10-CM | POA: Diagnosis not present

## 2021-11-29 DIAGNOSIS — S2242XA Multiple fractures of ribs, left side, initial encounter for closed fracture: Secondary | ICD-10-CM | POA: Diagnosis present

## 2021-11-29 DIAGNOSIS — Z951 Presence of aortocoronary bypass graft: Secondary | ICD-10-CM

## 2021-11-29 DIAGNOSIS — N320 Bladder-neck obstruction: Secondary | ICD-10-CM | POA: Diagnosis present

## 2021-11-29 DIAGNOSIS — Z993 Dependence on wheelchair: Secondary | ICD-10-CM

## 2021-11-29 DIAGNOSIS — Z79899 Other long term (current) drug therapy: Secondary | ICD-10-CM

## 2021-11-29 DIAGNOSIS — T83511A Infection and inflammatory reaction due to indwelling urethral catheter, initial encounter: Secondary | ICD-10-CM | POA: Diagnosis not present

## 2021-11-29 DIAGNOSIS — Z833 Family history of diabetes mellitus: Secondary | ICD-10-CM

## 2021-11-29 DIAGNOSIS — L24A9 Irritant contact dermatitis due friction or contact with other specified body fluids: Secondary | ICD-10-CM | POA: Diagnosis present

## 2021-11-29 DIAGNOSIS — Z681 Body mass index (BMI) 19 or less, adult: Secondary | ICD-10-CM

## 2021-11-29 DIAGNOSIS — D638 Anemia in other chronic diseases classified elsewhere: Secondary | ICD-10-CM | POA: Diagnosis present

## 2021-11-29 DIAGNOSIS — I951 Orthostatic hypotension: Secondary | ICD-10-CM | POA: Diagnosis present

## 2021-11-29 DIAGNOSIS — R64 Cachexia: Secondary | ICD-10-CM | POA: Diagnosis present

## 2021-11-29 DIAGNOSIS — N400 Enlarged prostate without lower urinary tract symptoms: Secondary | ICD-10-CM | POA: Diagnosis present

## 2021-11-29 DIAGNOSIS — F0390 Unspecified dementia without behavioral disturbance: Secondary | ICD-10-CM | POA: Diagnosis present

## 2021-11-29 DIAGNOSIS — Z20822 Contact with and (suspected) exposure to covid-19: Secondary | ICD-10-CM | POA: Diagnosis present

## 2021-11-29 DIAGNOSIS — E876 Hypokalemia: Secondary | ICD-10-CM | POA: Diagnosis not present

## 2021-11-29 DIAGNOSIS — Z66 Do not resuscitate: Secondary | ICD-10-CM | POA: Diagnosis present

## 2021-11-29 DIAGNOSIS — Z1624 Resistance to multiple antibiotics: Secondary | ICD-10-CM | POA: Diagnosis present

## 2021-11-29 DIAGNOSIS — G9341 Metabolic encephalopathy: Secondary | ICD-10-CM | POA: Diagnosis present

## 2021-11-29 DIAGNOSIS — Z794 Long term (current) use of insulin: Secondary | ICD-10-CM

## 2021-11-29 DIAGNOSIS — E11649 Type 2 diabetes mellitus with hypoglycemia without coma: Secondary | ICD-10-CM | POA: Diagnosis not present

## 2021-11-29 DIAGNOSIS — R627 Adult failure to thrive: Secondary | ICD-10-CM | POA: Diagnosis present

## 2021-11-29 DIAGNOSIS — M1990 Unspecified osteoarthritis, unspecified site: Secondary | ICD-10-CM | POA: Diagnosis present

## 2021-11-29 DIAGNOSIS — Y846 Urinary catheterization as the cause of abnormal reaction of the patient, or of later complication, without mention of misadventure at the time of the procedure: Secondary | ICD-10-CM | POA: Diagnosis present

## 2021-11-29 DIAGNOSIS — I10 Essential (primary) hypertension: Secondary | ICD-10-CM | POA: Diagnosis present

## 2021-11-29 DIAGNOSIS — E119 Type 2 diabetes mellitus without complications: Secondary | ICD-10-CM

## 2021-11-29 DIAGNOSIS — Z7401 Bed confinement status: Secondary | ICD-10-CM

## 2021-11-29 DIAGNOSIS — Z7982 Long term (current) use of aspirin: Secondary | ICD-10-CM

## 2021-11-29 DIAGNOSIS — L89152 Pressure ulcer of sacral region, stage 2: Secondary | ICD-10-CM | POA: Diagnosis present

## 2021-11-29 DIAGNOSIS — I2510 Atherosclerotic heart disease of native coronary artery without angina pectoris: Secondary | ICD-10-CM | POA: Diagnosis present

## 2021-11-29 DIAGNOSIS — E43 Unspecified severe protein-calorie malnutrition: Secondary | ICD-10-CM | POA: Diagnosis present

## 2021-11-29 LAB — URINALYSIS, ROUTINE W REFLEX MICROSCOPIC
Bilirubin Urine: NEGATIVE
Glucose, UA: >=500 mg/dL — AB
Hgb urine dipstick: NEGATIVE
Ketones, ur: NEGATIVE mg/dL
Nitrite: NEGATIVE
Protein, ur: 100 mg/dL — AB
Specific Gravity, Urine: 1.015 (ref 1.005–1.030)
WBC, UA: >50 WBC/hpf — ABNORMAL HIGH (ref 0–5)
pH: 7 (ref 5.0–8.0)

## 2021-11-29 LAB — CBC WITH DIFFERENTIAL/PLATELET
Abs Immature Granulocytes: 0.03 10*3/uL (ref 0.00–0.07)
Basophils Absolute: 0 10*3/uL (ref 0.0–0.1)
Basophils Relative: 0 %
Eosinophils Absolute: 0 10*3/uL (ref 0.0–0.5)
Eosinophils Relative: 0 %
HCT: 29.7 % — ABNORMAL LOW (ref 39.0–52.0)
Hemoglobin: 9.6 g/dL — ABNORMAL LOW (ref 13.0–17.0)
Immature Granulocytes: 0 %
Lymphocytes Relative: 19 %
Lymphs Abs: 1.6 10*3/uL (ref 0.7–4.0)
MCH: 26.7 pg (ref 26.0–34.0)
MCHC: 32.3 g/dL (ref 30.0–36.0)
MCV: 82.7 fL (ref 80.0–100.0)
Monocytes Absolute: 1 10*3/uL (ref 0.1–1.0)
Monocytes Relative: 12 %
Neutro Abs: 5.9 10*3/uL (ref 1.7–7.7)
Neutrophils Relative %: 69 %
Platelets: 375 10*3/uL (ref 150–400)
RBC: 3.59 MIL/uL — ABNORMAL LOW (ref 4.22–5.81)
RDW: 15.8 % — ABNORMAL HIGH (ref 11.5–15.5)
WBC: 8.6 10*3/uL (ref 4.0–10.5)
nRBC: 0 % (ref 0.0–0.2)

## 2021-11-29 LAB — CBG MONITORING, ED: Glucose-Capillary: 253 mg/dL — ABNORMAL HIGH (ref 70–99)

## 2021-11-29 LAB — PROTIME-INR
INR: 1 (ref 0.8–1.2)
Prothrombin Time: 13.4 seconds (ref 11.4–15.2)

## 2021-11-29 LAB — LACTIC ACID, PLASMA: Lactic Acid, Venous: 1.6 mmol/L (ref 0.5–1.9)

## 2021-11-29 LAB — COMPREHENSIVE METABOLIC PANEL
ALT: 21 U/L (ref 0–44)
AST: 24 U/L (ref 15–41)
Albumin: 2.5 g/dL — ABNORMAL LOW (ref 3.5–5.0)
Alkaline Phosphatase: 103 U/L (ref 38–126)
Anion gap: 8 (ref 5–15)
BUN: 16 mg/dL (ref 6–20)
CO2: 22 mmol/L (ref 22–32)
Calcium: 8.3 mg/dL — ABNORMAL LOW (ref 8.9–10.3)
Chloride: 105 mmol/L (ref 98–111)
Creatinine, Ser: 0.75 mg/dL (ref 0.61–1.24)
GFR, Estimated: >60 mL/min (ref >60)
Glucose, Bld: 267 mg/dL — ABNORMAL HIGH (ref 70–99)
Potassium: 3.7 mmol/L (ref 3.5–5.1)
Sodium: 135 mmol/L (ref 135–145)
Total Bilirubin: 0.4 mg/dL (ref 0.3–1.2)
Total Protein: 6.5 g/dL (ref 6.5–8.1)

## 2021-11-29 LAB — RESP PANEL BY RT-PCR (FLU A&B, COVID) ARPGX2
Influenza A by PCR: NEGATIVE
Influenza B by PCR: NEGATIVE
SARS Coronavirus 2 by RT PCR: NEGATIVE

## 2021-11-29 LAB — GLUCOSE, CAPILLARY: Glucose-Capillary: 223 mg/dL — ABNORMAL HIGH (ref 70–99)

## 2021-11-29 MED ORDER — ONDANSETRON HCL 4 MG PO TABS: 4.0000 mg | ORAL_TABLET | Freq: Four times a day (QID) | ORAL | Status: DC | PRN

## 2021-11-29 MED ORDER — ONDANSETRON HCL 4 MG/2ML IJ SOLN: 4.0000 mg | Freq: Four times a day (QID) | INTRAMUSCULAR | Status: DC | PRN

## 2021-11-29 MED ORDER — SENNOSIDES-DOCUSATE SODIUM 8.6-50 MG PO TABS: 1.0000 | ORAL_TABLET | Freq: Every evening | ORAL | Status: DC | PRN

## 2021-11-29 MED ORDER — DOCUSATE SODIUM 100 MG PO CAPS
100.0000 mg | ORAL_CAPSULE | Freq: Every day | ORAL | Status: DC
  Administered 2021-11-30 – 2021-12-03 (×3): 100 mg via ORAL
  Filled 2021-11-29 (×3): qty 1

## 2021-11-29 MED ORDER — ROSUVASTATIN CALCIUM 20 MG PO TABS
40.0000 mg | ORAL_TABLET | Freq: Every day | ORAL | Status: DC
  Administered 2021-11-30 – 2021-12-02 (×3): 40 mg via ORAL
  Filled 2021-11-29 (×3): qty 2

## 2021-11-29 MED ORDER — LACTATED RINGERS IV SOLN: INTRAVENOUS | Status: DC

## 2021-11-29 MED ORDER — DRONABINOL 5 MG PO CAPS
5.0000 mg | ORAL_CAPSULE | Freq: Two times a day (BID) | ORAL | Status: DC
  Administered 2021-11-30 – 2021-12-05 (×10): 5 mg via ORAL
  Filled 2021-11-29 (×10): qty 1

## 2021-11-29 MED ORDER — MIDODRINE HCL 5 MG PO TABS
10.0000 mg | ORAL_TABLET | Freq: Three times a day (TID) | ORAL | Status: DC
  Administered 2021-11-30 – 2021-12-02 (×8): 10 mg via ORAL
  Filled 2021-11-29 (×10): qty 2

## 2021-11-29 MED ORDER — THIAMINE HCL 100 MG PO TABS
100.0000 mg | ORAL_TABLET | Freq: Every day | ORAL | Status: DC
  Administered 2021-11-30 – 2021-12-05 (×6): 100 mg via ORAL
  Filled 2021-11-29 (×10): qty 1

## 2021-11-29 MED ORDER — ASPIRIN 81 MG PO TBEC
81.0000 mg | DELAYED_RELEASE_TABLET | Freq: Every day | ORAL | Status: DC
  Administered 2021-11-30 – 2021-12-05 (×6): 81 mg via ORAL
  Filled 2021-11-29 (×6): qty 1

## 2021-11-29 MED ORDER — LACTATED RINGERS IV BOLUS: 1000.0000 mL | Freq: Once | INTRAVENOUS | Status: DC

## 2021-11-29 MED ORDER — INSULIN ASPART 100 UNIT/ML IJ SOLN
0.0000 [IU] | Freq: Every day | INTRAMUSCULAR | Status: DC
  Administered 2021-11-29 – 2021-12-03 (×4): 2 [IU] via SUBCUTANEOUS

## 2021-11-29 MED ORDER — INSULIN ASPART 100 UNIT/ML IJ SOLN
0.0000 [IU] | Freq: Three times a day (TID) | INTRAMUSCULAR | Status: DC
  Administered 2021-11-30: 2 [IU] via SUBCUTANEOUS
  Administered 2021-11-30 – 2021-12-01 (×2): 3 [IU] via SUBCUTANEOUS
  Administered 2021-12-01 (×2): 1 [IU] via SUBCUTANEOUS
  Administered 2021-12-02: 3 [IU] via SUBCUTANEOUS
  Administered 2021-12-02: 1 [IU] via SUBCUTANEOUS
  Administered 2021-12-03: 3 [IU] via SUBCUTANEOUS
  Administered 2021-12-03 (×2): 2 [IU] via SUBCUTANEOUS
  Administered 2021-12-04 (×2): 3 [IU] via SUBCUTANEOUS
  Administered 2021-12-04 – 2021-12-05 (×2): 2 [IU] via SUBCUTANEOUS

## 2021-11-29 MED ORDER — ACETAMINOPHEN 650 MG RE SUPP: 650.0000 mg | Freq: Four times a day (QID) | RECTAL | Status: DC | PRN

## 2021-11-29 MED ORDER — SODIUM CHLORIDE 0.9 % IV SOLN
1.0000 g | INTRAVENOUS | Status: DC
  Administered 2021-11-30: 1 g via INTRAVENOUS
  Filled 2021-11-29: qty 10

## 2021-11-29 MED ORDER — SODIUM CHLORIDE 0.9 % IV SOLN
2.0000 g | Freq: Once | INTRAVENOUS | Status: AC
  Administered 2021-11-29: 2 g via INTRAVENOUS
  Filled 2021-11-29: qty 20

## 2021-11-29 MED ORDER — ACETAMINOPHEN 325 MG PO TABS: 650.0000 mg | ORAL_TABLET | Freq: Four times a day (QID) | ORAL | Status: DC | PRN

## 2021-11-29 MED ORDER — POLYETHYLENE GLYCOL 3350 17 G PO PACK
17.0000 g | PACK | Freq: Every day | ORAL | Status: DC
  Administered 2021-11-30 – 2021-12-03 (×3): 17 g via ORAL
  Filled 2021-11-29 (×3): qty 1

## 2021-11-29 MED ORDER — EZETIMIBE 10 MG PO TABS
10.0000 mg | ORAL_TABLET | Freq: Every day | ORAL | Status: DC
  Administered 2021-11-30 – 2021-12-02 (×3): 10 mg via ORAL
  Filled 2021-11-29 (×3): qty 1

## 2021-11-29 MED ORDER — PANTOPRAZOLE SODIUM 40 MG PO TBEC
40.0000 mg | DELAYED_RELEASE_TABLET | Freq: Every day | ORAL | Status: DC
  Administered 2021-11-30 – 2021-12-02 (×3): 40 mg via ORAL
  Filled 2021-11-29 (×3): qty 1

## 2021-11-29 MED ORDER — LACTATED RINGERS IV BOLUS
1000.0000 mL | Freq: Once | INTRAVENOUS | Status: AC
  Administered 2021-11-29: 1000 mL via INTRAVENOUS

## 2021-11-29 NOTE — Assessment & Plan Note (Signed)
X-ray shows recent fractures in the posterior lateral aspects of left fourth and fifth ribs.  No pneumothorax. -Incentive spirometer and supplemental oxygen as needed

## 2021-11-29 NOTE — ED Provider Notes (Signed)
[place] DEPT Provider Note   CSN: [id number] Arrival date & time: 11/29/21  1723     History  Chief Complaint  Patient presents with   Fever   Altered Mental Status    [facility] [name] is a 61 y.o. male.   Fever Altered Mental Status Associated symptoms: fever     Patient denies any medical history significant for hypertension, CAD s/p CABG, DM2, HLD, metabolic encephalopathy, GI bleed, malnutrition, adult FTT, chronic catheter (BPH / bladder outlet obstruction)  [facility] at [facility] - he's been there for >6 months. For rehab after ?  According to EMS patient was found of a fever and to be more confused and slightly combative this morning at rehab facility.  Was sent to the emergency room because he has history of encephalopathy in the past--most recently 5 months ago--due to UTI.  Seems to have history of BPH/bladder outlet obstruction Dr. Louis Meckel urology follows.   - this AM was having tremors and had fever but no fever documented.   Unable to contact patient's sister for additional information.  Level 5 caveat due to altered mental status      Home Medications Prior to Admission medications   Medication Sig Start Date End Date Taking? Authorizing Provider  aspirin EC 81 MG tablet Take 1 tablet (81 mg total) by mouth daily. Swallow whole. 09/14/21   Pokhrel, Corrie Mckusick, MD  blood glucose meter kit and supplies Dispense based on patient and insurance preference. Use up to four times daily as directed. (FOR ICD-10 E10.9, E11.9). 12/23/20   de [place], Blondell Reveal, MD  docusate sodium (COLACE) 100 MG capsule Take 1 capsule (100 mg total) by mouth at bedtime. 09/11/21 12/20/21  Pokhrel, Corrie Mckusick, MD  dronabinol (MARINOL) 2.5 MG capsule Take 2.5 mg by mouth 2 (two) times daily before a meal.    [provider]  ezetimibe (ZETIA) 10 MG tablet Take 1 tablet (10 mg total) by mouth daily. 07/07/21   Thurnell Lose, MD  feeding supplement (ENSURE  ENLIVE / ENSURE PLUS) LIQD Take 237 mLs by mouth 3 (three) times daily between meals. 08/31/21 11/29/21  Hosie Poisson, MD  hydrocortisone (ANUSOL-HC) 2.5 % rectal cream Place rectally 2 (two) times daily. 09/11/21   Pokhrel, Laxman, MD  insulin glargine (LANTUS SOLOSTAR) 100 UNIT/ML Solostar Pen Inject 5 Units into the skin daily.    [provider]  Lido-PE-Glycerin-Petrolatum (PREPARATION H RAPID RELIEF) 5-0.25-14.4-15 % CREA Place 1 Application rectally every 6 (six) hours as needed (hemorrhoids).    [provider]  midodrine (PROAMATINE) 10 MG tablet Take 1 tablet (10 mg total) by mouth 3 (three) times daily with meals. 07/07/21   Thurnell Lose, MD  Multiple Vitamin (MULTIVITAMIN WITH MINERALS) TABS tablet Take 1 tablet by mouth daily. 09/01/21   Hosie Poisson, MD  pantoprazole (PROTONIX) 40 MG tablet Take 1 tablet (40 mg total) by mouth daily. Any generic PPI is okay to dispense Patient taking differently: Take 40 mg by mouth daily. 04/25/21   Rai, Ripudeep K, MD  polyethylene glycol (MIRALAX / GLYCOLAX) 17 g packet Take 17 g by mouth daily. 09/11/21   Pokhrel, Corrie Mckusick, MD  rosuvastatin (CRESTOR) 40 MG tablet Take 1 tablet (40 mg total) by mouth daily. 04/25/21   Rai, Vernelle Emerald, MD  thiamine 100 MG tablet Take 1 tablet (100 mg total) by mouth daily. 09/01/21   Hosie Poisson, MD      Allergies    Patient has no known allergies.  Review of Systems   Review of Systems  Constitutional:  Positive for fever.    Physical Exam Updated Vital Signs BP 123/73   Pulse 74   Temp 98.9 F (37.2 C) (Rectal)   Resp 19   SpO2 99%  Physical Exam Vitals and nursing note reviewed.  Constitutional:      General: He is not in acute distress.    Comments: Cachectic 61 year old male Somewhat contracted Follows commands as best he can  HENT:     Head: Normocephalic and atraumatic.     Nose: Nose normal.     Mouth/Throat:     Mouth: Mucous membranes are dry.     Comments:  Significantly dry oral mucosa Eyes:     General: No scleral icterus. Cardiovascular:     Rate and Rhythm: Normal rate and regular rhythm.     Pulses: Normal pulses.     Heart sounds: Normal heart sounds.  Pulmonary:     Effort: Pulmonary effort is normal. No respiratory distress.     Breath sounds: No wheezing.  Abdominal:     Palpations: Abdomen is soft.     Tenderness: There is no abdominal tenderness. There is no guarding or rebound.  Musculoskeletal:     Cervical back: Normal range of motion.     Right lower leg: No edema.     Left lower leg: No edema.     Comments: Diffusely weak and somewhat contracted in bilateral hands, upper extremities and legs.  I am able to straighten out his extremities however he does seem to have occasional spasms.   Skin:    General: Skin is warm and dry.     Capillary Refill: Capillary refill takes less than 2 seconds.     Comments: No decub ulcers to sacrum  Neurological:     Mental Status: He is alert.     Comments: Oriented to self  Psychiatric:        Mood and Affect: Mood normal.        Behavior: Behavior normal.     ED Results / Procedures / Treatments   Labs (all labs ordered are listed, but only abnormal results are displayed) Labs Reviewed  COMPREHENSIVE METABOLIC PANEL - Abnormal; Notable for the following components:      Result Value   Glucose, Bld 267 (*)    Calcium 8.3 (*)    Albumin 2.5 (*)    All other components within normal limits  CBC WITH DIFFERENTIAL/PLATELET - Abnormal; Notable for the following components:   RBC 3.59 (*)    Hemoglobin 9.6 (*)    HCT 29.7 (*)    RDW 15.8 (*)    All other components within normal limits  URINALYSIS, ROUTINE W REFLEX MICROSCOPIC - Abnormal; Notable for the following components:   APPearance CLOUDY (*)    Glucose, UA >=500 (*)    Protein, ur 100 (*)    Leukocytes,Ua MODERATE (*)    WBC, UA >50 (*)    Bacteria, UA RARE (*)    All other components within normal limits  CBG  MONITORING, ED - Abnormal; Notable for the following components:   Glucose-Capillary 253 (*)    All other components within normal limits  RESP PANEL BY RT-PCR (FLU A&B, COVID) ARPGX2  CULTURE, BLOOD (ROUTINE X 2)  CULTURE, BLOOD (ROUTINE X 2)  URINE CULTURE  LACTIC ACID, PLASMA  PROTIME-INR    EKG EKG Interpretation  Date/Time:  Tuesday November 29 2021 17:38:37 EDT Ventricular Rate:  83  PR Interval:  154 QRS Duration: 80 QT Interval:  370 QTC Calculation: 435 R Axis:   72 Text Interpretation: Sinus rhythm Borderline T abnormalities, inferior leads Confirmed by Dene Gentry [phone]) on 11/29/2021 6:04:04 PM  Radiology DG Chest Portable 1 View  Result Date: 11/29/2021 CLINICAL DATA:  Altered mental status EXAM: PORTABLE CHEST 1 VIEW COMPARISON:  07/02/2021 FINDINGS: The heart size and mediastinal contours are within normal limits. Both lungs are clear. There is previous coronary bypass surgery. Recent fractures are seen in the posterolateral aspects of left fourth and fifth ribs. There is minimal offset in alignment of fracture fragments in the fourth rib. IMPRESSION: There are no signs of pulmonary edema or focal pulmonary consolidation. Recent fractures are seen in the posterolateral aspects of left fourth and fifth ribs. Electronically Signed   By: Elmer Picker M.D.   On: 11/29/2021 18:30    Procedures Procedures    Medications Ordered in ED Medications  cefTRIAXone (ROCEPHIN) 2 g in sodium chloride 0.9 % 100 mL IVPB (has no administration in time range)  lactated ringers bolus 1,000 mL (1,000 mLs Intravenous New Bag/Given 11/29/21 1822)    ED Course/ Medical Decision Making/ A&P Clinical Course as of 11/29/21 2000  Tue Nov 29, 2021  1923 Attempted to call sister - left VM   [WF]  1924 Urine --> likely DC home [WF]    Clinical Course User Index [WF] Tedd Sias, [place]                           Medical Decision Making Amount and/or Complexity of Data  Reviewed Labs: ordered. Radiology: ordered.  Risk Decision regarding hospitalization.   This patient presents to the ED for concern of AMS/Fever, this involves a number of treatment options, and is a complaint that carries with it a high risk of complications and morbidity.  The differential diagnosis includes sepsis/metabolic encephalopathy.    Co morbidities: Discussed in HPI   Brief History:  Patient denies any medical history significant for hypertension, CAD s/p CABG, DM2, HLD, metabolic encephalopathy, GI bleed, malnutrition, adult FTT, chronic catheter (BPH / bladder outlet obstruction)  [facility] at [facility] - he's been there for >6 months. For rehab after ?  According to EMS patient was found of a fever and to be more confused and slightly combative this morning at rehab facility.  Was sent to the emergency room because he has history of encephalopathy in the past--most recently 5 months ago--due to UTI.  Seems to have history of BPH/bladder outlet obstruction Dr. Louis Meckel urology follows.   - this AM was having tremors and had fever but no fever documented.   Unable to contact patient's sister for additional information.    EMR reviewed including pt PMHx, past surgical history and past visits to ER.   See HPI for more details   Lab Tests:   I ordered and independently interpreted labs. Labs notable for Urinalysis with WBC greater than 50 moderate leukocytes with bacteria.  CMP with hyperglycemia likely reactive secondary to infection.  Lactic within normal limits blood cultures and urine culture pending.  Imaging Studies:  No imaging studies ordered for this patient    Cardiac Monitoring:  The patient was maintained on a cardiac monitor.  I personally viewed and interpreted the cardiac monitored which showed an underlying rhythm of: NSR occasionally sinus tachycardia EKG nonischemic   Medicines ordered:  I ordered medication including crystalloid  fluids, Rocephin for  UTI Reevaluation of the patient after these medicines showed that the patient improved I have reviewed the patients home medicines and have made adjustments as needed   Critical Interventions:     Consults/Attending Physician   I discussed this case with my attending physician who cosigned this note including patient's presenting symptoms, physical exam, and planned diagnostics and interventions. Attending physician stated agreement with plan or made changes to plan which were implemented.   Admitted to hospitalist _____  Reevaluation:  After the interventions noted above I re-evaluated patient and found that they have :improved   Social Determinants of Health:      Problem List / ED Course:  Encephalopathy likely secondary to UTI.  Labs overall reassuring.  Will admit for observation given uncertain treatment and monitoring at home/SNF and transient tachycardia here in the ER and altered mental status.   Dispostion:  After consideration of the diagnostic results and the patients response to treatment, I feel that the patent would benefit from admission   Final Clinical Impression(s) / ED Diagnoses Final diagnoses:  Acute cystitis without hematuria    Rx / DC Orders ED Discharge Orders     None         Tedd Sias, [place] 11/29/21 2013    Valarie Merino, MD 11/29/21 [phone]

## 2021-11-29 NOTE — Assessment & Plan Note (Signed)
Continue rosuvastatin and Zetia. 

## 2021-11-29 NOTE — H&P (Signed)
History and Physical    Charles [name] KKX:[id number] DOB: [birth year] DOA: 11/29/2021  PCP: Darreld Mclean, MD  [name]  I have personally briefly reviewed patient's old medical records in [place]  Chief Complaint: Altered mental status  HPI: Charles [name] is a 61 y.o. male with medical history significant for CAD s/p CABG, insulin-dependent T2DM, chronic orthostatic hypotension on midodrine, anemia of chronic disease, BPH/BOO with chronic indwelling catheter, failure to thrive, suspected underlying cognitive impairment who presented to the ED from SNF for evaluation of altered mental status.  History limited from patient is otherwise supplemented by EDP and chart review.  Patient has been residing at [facility] reportedly for over 6 months.  He was noted to have a fever and appeared more confused and slightly combative this morning at the SNF.  EMS were called and he was brought to the ED for further evaluation.  There was concern for recurrent encephalopathy due to UTI.  Patient appears chronically ill and cachectic.  He is awake but lethargic.  Speech is slow.  He can tell me his name but otherwise not oriented to place, year, situation.  Per previous documentation it appears that his baseline orientation is to person and place.  Appears to have significant functional decline since last hospitalization, now wheelchair or bedbound.  ED Course  Labs/Imaging on admission: I have personally reviewed following labs and imaging studies.  Initial vitals showed BP 132/79, pulse 80, RR 10, temp 98.9 F, SPO2 99% on room air.  Labs show WBC 8.6, hemoglobin 9.6, platelets 375,000, sodium 135, potassium 3.7, bicarb 22, BUN 16, creatinine 0.75, serum glucose 267, LFTs within normal limits, lactic acid 1.6.  Urinalysis shows negative nitrates, moderate leukocytes, 0-5 RBC/hpf, >50 WBC/hpf, rare bacteria microscopy.  Blood and urine cultures in process.   SARS-CoV-2 and influenza PCR negative.  Portable chest x-ray negative for focal consolidation, edema, effusion.  Recent fracture seen in the posterolateral aspects of left fourth and fifth ribs.  Patient was given 1 L LR and IV ceftriaxone.  Foley catheter exchanged in the ED.  The hospitalist service was consulted to admit for further evaluation and management.  Review of Systems:  All systems reviewed and are negative except as documented in history of present illness above.   Past Medical History:  Diagnosis Date   Arthritis    Diabetes mellitus without complication ([place])    Heart disease    Hypertension     Past Surgical History:  Procedure Laterality Date   COLONOSCOPY WITH PROPOFOL N/A 08/21/2021   Procedure: COLONOSCOPY WITH PROPOFOL;  Surgeon: Daryel November, MD;  Location: [facility] ENDOSCOPY;  Service: Gastroenterology;  Laterality: N/A;   CORONARY ARTERY BYPASS GRAFT     ESOPHAGOGASTRODUODENOSCOPY (EGD) WITH PROPOFOL N/A 08/21/2021   Procedure: ESOPHAGOGASTRODUODENOSCOPY (EGD) WITH PROPOFOL;  Surgeon: Daryel November, MD;  Location: [facility];  Service: Gastroenterology;  Laterality: N/A;   POLYPECTOMY  08/21/2021   Procedure: POLYPECTOMY;  Surgeon: Daryel November, MD;  Location: [facility] ENDOSCOPY;  Service: Gastroenterology;;    Social History:  reports that he has never smoked. He has never used smokeless tobacco. He reports that he does not currently use alcohol. He reports that he does not use drugs.  No Known Allergies  Family History  Problem Relation Age of Onset   Diabetes Mother    Hypertension Mother    Diabetes Father    Hypertension Father    Heart attack Father  Diabetes Sister    Diabetes Sister      Prior to Admission medications   Medication Sig Start Date End Date Taking? Authorizing Provider  aspirin EC 81 MG tablet Take 1 tablet (81 mg total) by mouth daily. Swallow whole. 09/14/21   Pokhrel, Corrie Mckusick, MD  blood glucose meter kit and  supplies Dispense based on patient and insurance preference. Use up to four times daily as directed. (FOR ICD-10 E10.9, E11.9). 12/23/20   de [place], Blondell Reveal, MD  docusate sodium (COLACE) 100 MG capsule Take 1 capsule (100 mg total) by mouth at bedtime. 09/11/21 12/20/21  Pokhrel, Corrie Mckusick, MD  dronabinol (MARINOL) 2.5 MG capsule Take 2.5 mg by mouth 2 (two) times daily before a meal.    [provider]  ezetimibe (ZETIA) 10 MG tablet Take 1 tablet (10 mg total) by mouth daily. 07/07/21   Thurnell Lose, MD  feeding supplement (ENSURE ENLIVE / ENSURE PLUS) LIQD Take 237 mLs by mouth 3 (three) times daily between meals. 08/31/21 11/29/21  Hosie Poisson, MD  hydrocortisone (ANUSOL-HC) 2.5 % rectal cream Place rectally 2 (two) times daily. 09/11/21   Pokhrel, Laxman, MD  insulin glargine (LANTUS SOLOSTAR) 100 UNIT/ML Solostar Pen Inject 5 Units into the skin daily.    [provider]  Lido-PE-Glycerin-Petrolatum (PREPARATION H RAPID RELIEF) 5-0.25-14.4-15 % CREA Place 1 Application rectally every 6 (six) hours as needed (hemorrhoids).    [provider]  midodrine (PROAMATINE) 10 MG tablet Take 1 tablet (10 mg total) by mouth 3 (three) times daily with meals. 07/07/21   Thurnell Lose, MD  Multiple Vitamin (MULTIVITAMIN WITH MINERALS) TABS tablet Take 1 tablet by mouth daily. 09/01/21   Hosie Poisson, MD  pantoprazole (PROTONIX) 40 MG tablet Take 1 tablet (40 mg total) by mouth daily. Any generic PPI is okay to dispense Patient taking differently: Take 40 mg by mouth daily. 04/25/21   Rai, Ripudeep K, MD  polyethylene glycol (MIRALAX / GLYCOLAX) 17 g packet Take 17 g by mouth daily. 09/11/21   Pokhrel, Corrie Mckusick, MD  rosuvastatin (CRESTOR) 40 MG tablet Take 1 tablet (40 mg total) by mouth daily. 04/25/21   Rai, Vernelle Emerald, MD  thiamine 100 MG tablet Take 1 tablet (100 mg total) by mouth daily. 09/01/21   Hosie Poisson, MD    Physical Exam: Vitals:   11/29/21 1900 11/29/21 1945 11/29/21  2045 11/29/21 2128  BP: 123/73 119/74 105/64 110/68  Pulse: 74  77 81  Resp: _0 Temp:   98 F (36.7 C) 98.2 F (36.8 C)  TempSrc:   Oral Oral  SpO2: 99%  99% 100%   Constitutional: Chronically ill-appearing cachectic man resting in bed with head elevated. Eyes: EOMI, lids and conjunctivae normal ENMT: Mucous membranes are dry. Posterior pharynx clear of any exudate or lesions. Neck: normal, supple, no masses. Respiratory: clear to auscultation anteriorly.  Normal respiratory effort. No accessory muscle use.  Cardiovascular: Regular rate and rhythm, no murmurs / rubs / gallops. No extremity edema. 2+ pedal pulses. Abdomen: no tenderness, no masses palpated. GU: Foley catheter in place Musculoskeletal: Thin extremities with muscle wasting throughout, Skin: no rashes Neurologic: Sensation intact.  Generally weak, not moving extremities or following commands Psychiatric: Awake, alert.  Oriented to self but not place, year, situation.  EKG: Personally reviewed. Sinus rhythm, rate 88, no acute ischemic changes.  Assessment/Plan Principal Problem:   Catheter-associated urinary tract infection (HCC) Active Problems:   Acute metabolic encephalopathy   Insulin  dependent type 2 diabetes mellitus ([place])   CAD S/p remote CABG   BPH (benign prostatic hyperplasia)   HLD (hyperlipidemia)   Chronic orthostatic hypotension   Anemia of chronic disease   Multiple fractures of ribs, left side, initial encounter for closed fracture   Failure to thrive in adult   [facility] [name] is a 61 y.o. male with medical history significant for CAD s/p CABG, insulin-dependent T2DM, chronic orthostatic hypotension on midodrine, anemia of chronic disease, BPH/BOO with chronic indwelling catheter, failure to thrive, suspected underlying cognitive impairment who is admitted with acute metabolic encephalopathy due to UTI.  Assessment and Plan: * Catheter-associated urinary tract infection ([place]) BPH/BOO  with chronic indwelling Foley catheter Suspected UTI as cause of acute metabolic encephalopathy on background of likely underlying cognitive impairment. -Foley exchanged in the ED -Continue empiric IV ceftriaxone -Follow urine culture  Acute metabolic encephalopathy In setting of UTI with delirium on background of likely underlying cognitive impairment.  Oriented only to self on admission. -Continue treatment as above -Delirium precautions  Insulin dependent type 2 diabetes mellitus ([place]) Place on SSI with HS coverage.  Failure to thrive in adult Cachectic with documented significant functional decline last several months.  Seen by palliative care previously while in hospital, would benefit from continued palliative care discussions.  Multiple fractures of ribs, left side, initial encounter for closed fracture X-ray shows recent fractures in the posterior lateral aspects of left fourth and fifth ribs.  No pneumothorax. -Incentive spirometer and supplemental oxygen as needed  Anemia of chronic disease Hemoglobin stable at 9.6.  Admitted twice earlier this year for lower GI bleeding. -Continue to monitor while on aspirin  Chronic orthostatic hypotension Continue midodrine.  HLD (hyperlipidemia) Continue rosuvastatin and Zetia.  CAD S/p remote CABG Continue aspirin, rosuvastatin, Zetia.  DVT prophylaxis: SCDs Start: 11/29/21 2113 Code Status: Full code based on prior Family Communication: None available on admission Disposition Plan: From SNF and likely discharge to SNF pending clinical progress Consults called: None Severity of Illness: The appropriate patient status for this patient is OBSERVATION. Observation status is judged to be reasonable and necessary in order to provide the required intensity of service to ensure the patient's safety. The patient's presenting symptoms, physical exam findings, and initial radiographic and laboratory data in the context of their medical  condition is felt to place them at decreased risk for further clinical deterioration. Furthermore, it is anticipated that the patient will be medically stable for discharge from the hospital within 2 midnights of admission.   Zada Finders MD [facility]  If 7PM-7AM, please contact night-coverage [email or web address]  11/29/2021, 9:33 PM

## 2021-11-29 NOTE — Assessment & Plan Note (Addendum)
BPH/BOO with chronic indwelling Foley catheter Suspected UTI as cause of acute metabolic encephalopathy on background of likely underlying cognitive impairment. -Foley exchanged in the ED -Continue empiric IV ceftriaxone -Follow urine culture

## 2021-11-29 NOTE — Assessment & Plan Note (Signed)
Continue midodrine  

## 2021-11-29 NOTE — Assessment & Plan Note (Signed)
Continue aspirin, rosuvastatin, Zetia.

## 2021-11-29 NOTE — Assessment & Plan Note (Signed)
Patient has had "precipitous decline" in last 9 months, both physically and mentally.  It seems that within the last year even, he was still working.    He has been evaluated by Neurology (Dr. Leta Baptist several times in the office and also in consultation in the hospital) and I am surprised no diagnosis of neurodegenerative disease was made, because it certainly seems like the time course and severity of it are more than the nominal working diagnosis of "diabetes related complications" which is what all of his symptoms have been attributed to, and which is certainly possible.  Regardless, extensive work up for reversible/correctable causes of dementia and weakness and weight loss by hospitalists/Neurology/GI have all been negative.   Since May, has lost >20% of body weight and is now with severe diffuse loss of subcutaneous muscle mass and fat.     Appears Hospice appropriate.  Sister [name] is involved in his care and is arranging for him to move to [facility] in 2 weeks, to be close to her. - Consult Palliative Care - Continue Glucerna

## 2021-11-29 NOTE — Assessment & Plan Note (Signed)
Hemoglobin stable at 9.6.  Admitted twice earlier this year for lower GI bleeding. -Continue to monitor while on aspirin

## 2021-11-29 NOTE — Assessment & Plan Note (Signed)
Place on SSI with HS coverage.

## 2021-11-29 NOTE — Hospital Course (Signed)
Charles [name] is an unfortunate 61 y.o. M with hx IDDM, CAD s/p CABG, orthostasis on midodrine, anemia and BPH now with chronic indwelling foley as well as progressive dementia, leg weakness and failure to thrive who presented with confusion.     9/19: Admitted, UA appeared infected, foley replaced and started on antibiotics 9/20: No improvement 9/21: Improving mentation, noted to have right sided weakness

## 2021-11-29 NOTE — Assessment & Plan Note (Signed)
In setting of UTI with delirium on background of likely underlying cognitive impairment.  Oriented only to self on admission. -Continue treatment as above -Delirium precautions

## 2021-11-29 NOTE — ED Triage Notes (Signed)
Pt from [facility], BIB EMS  Called out d/t fever and AMS noticed at 0700 this morning. Unclear if started earlier than that because no night shift report available.

## 2021-11-30 DIAGNOSIS — E11649 Type 2 diabetes mellitus with hypoglycemia without coma: Secondary | ICD-10-CM | POA: Diagnosis not present

## 2021-11-30 DIAGNOSIS — L89152 Pressure ulcer of sacral region, stage 2: Secondary | ICD-10-CM

## 2021-11-30 DIAGNOSIS — Z1624 Resistance to multiple antibiotics: Secondary | ICD-10-CM | POA: Diagnosis present

## 2021-11-30 DIAGNOSIS — N320 Bladder-neck obstruction: Secondary | ICD-10-CM | POA: Diagnosis present

## 2021-11-30 DIAGNOSIS — N390 Urinary tract infection, site not specified: Secondary | ICD-10-CM | POA: Diagnosis not present

## 2021-11-30 DIAGNOSIS — Z681 Body mass index (BMI) 19 or less, adult: Secondary | ICD-10-CM | POA: Diagnosis not present

## 2021-11-30 DIAGNOSIS — S2242XA Multiple fractures of ribs, left side, initial encounter for closed fracture: Secondary | ICD-10-CM | POA: Diagnosis present

## 2021-11-30 DIAGNOSIS — E876 Hypokalemia: Secondary | ICD-10-CM | POA: Diagnosis not present

## 2021-11-30 DIAGNOSIS — F0390 Unspecified dementia without behavioral disturbance: Secondary | ICD-10-CM

## 2021-11-30 DIAGNOSIS — I951 Orthostatic hypotension: Secondary | ICD-10-CM | POA: Diagnosis present

## 2021-11-30 DIAGNOSIS — N400 Enlarged prostate without lower urinary tract symptoms: Secondary | ICD-10-CM | POA: Diagnosis present

## 2021-11-30 DIAGNOSIS — E43 Unspecified severe protein-calorie malnutrition: Secondary | ICD-10-CM | POA: Diagnosis present

## 2021-11-30 DIAGNOSIS — L24A9 Irritant contact dermatitis due friction or contact with other specified body fluids: Secondary | ICD-10-CM | POA: Diagnosis present

## 2021-11-30 DIAGNOSIS — I10 Essential (primary) hypertension: Secondary | ICD-10-CM | POA: Diagnosis present

## 2021-11-30 DIAGNOSIS — Z794 Long term (current) use of insulin: Secondary | ICD-10-CM | POA: Diagnosis not present

## 2021-11-30 DIAGNOSIS — I2510 Atherosclerotic heart disease of native coronary artery without angina pectoris: Secondary | ICD-10-CM | POA: Diagnosis present

## 2021-11-30 DIAGNOSIS — G9341 Metabolic encephalopathy: Secondary | ICD-10-CM | POA: Diagnosis present

## 2021-11-30 DIAGNOSIS — R627 Adult failure to thrive: Secondary | ICD-10-CM | POA: Diagnosis present

## 2021-11-30 DIAGNOSIS — E785 Hyperlipidemia, unspecified: Secondary | ICD-10-CM | POA: Diagnosis present

## 2021-11-30 DIAGNOSIS — T83518A Infection and inflammatory reaction due to other urinary catheter, initial encounter: Secondary | ICD-10-CM | POA: Diagnosis present

## 2021-11-30 DIAGNOSIS — E119 Type 2 diabetes mellitus without complications: Secondary | ICD-10-CM | POA: Diagnosis present

## 2021-11-30 DIAGNOSIS — Z20822 Contact with and (suspected) exposure to covid-19: Secondary | ICD-10-CM | POA: Diagnosis present

## 2021-11-30 DIAGNOSIS — D638 Anemia in other chronic diseases classified elsewhere: Secondary | ICD-10-CM | POA: Diagnosis present

## 2021-11-30 DIAGNOSIS — M1990 Unspecified osteoarthritis, unspecified site: Secondary | ICD-10-CM | POA: Diagnosis present

## 2021-11-30 DIAGNOSIS — Z66 Do not resuscitate: Secondary | ICD-10-CM | POA: Diagnosis present

## 2021-11-30 DIAGNOSIS — N3000 Acute cystitis without hematuria: Secondary | ICD-10-CM | POA: Diagnosis present

## 2021-11-30 DIAGNOSIS — R64 Cachexia: Secondary | ICD-10-CM | POA: Diagnosis present

## 2021-11-30 DIAGNOSIS — T83511A Infection and inflammatory reaction due to indwelling urethral catheter, initial encounter: Secondary | ICD-10-CM | POA: Diagnosis not present

## 2021-11-30 DIAGNOSIS — N401 Enlarged prostate with lower urinary tract symptoms: Secondary | ICD-10-CM | POA: Diagnosis not present

## 2021-11-30 DIAGNOSIS — Z7189 Other specified counseling: Secondary | ICD-10-CM | POA: Diagnosis not present

## 2021-11-30 DIAGNOSIS — Y846 Urinary catheterization as the cause of abnormal reaction of the patient, or of later complication, without mention of misadventure at the time of the procedure: Secondary | ICD-10-CM | POA: Diagnosis present

## 2021-11-30 LAB — BASIC METABOLIC PANEL
Anion gap: 10 (ref 5–15)
BUN: 13 mg/dL (ref 6–20)
CO2: 25 mmol/L (ref 22–32)
Calcium: 8.6 mg/dL — ABNORMAL LOW (ref 8.9–10.3)
Chloride: 105 mmol/L (ref 98–111)
Creatinine, Ser: 0.52 mg/dL — ABNORMAL LOW (ref 0.61–1.24)
GFR, Estimated: >60 mL/min (ref >60)
Glucose, Bld: 186 mg/dL — ABNORMAL HIGH (ref 70–99)
Potassium: 3.5 mmol/L (ref 3.5–5.1)
Sodium: 140 mmol/L (ref 135–145)

## 2021-11-30 LAB — GLUCOSE, CAPILLARY
Glucose-Capillary: 196 mg/dL — ABNORMAL HIGH (ref 70–99)
Glucose-Capillary: 202 mg/dL — ABNORMAL HIGH (ref 70–99)
Glucose-Capillary: 86 mg/dL (ref 70–99)
Glucose-Capillary: 184 mg/dL — ABNORMAL HIGH (ref 70–99)

## 2021-11-30 LAB — CBC
HCT: 31.4 % — ABNORMAL LOW (ref 39.0–52.0)
Hemoglobin: 9.9 g/dL — ABNORMAL LOW (ref 13.0–17.0)
MCH: 27 pg (ref 26.0–34.0)
MCHC: 31.5 g/dL (ref 30.0–36.0)
MCV: 85.6 fL (ref 80.0–100.0)
Platelets: 332 10*3/uL (ref 150–400)
RBC: 3.67 MIL/uL — ABNORMAL LOW (ref 4.22–5.81)
RDW: 15.8 % — ABNORMAL HIGH (ref 11.5–15.5)
WBC: 10.2 10*3/uL (ref 4.0–10.5)
nRBC: 0 % (ref 0.0–0.2)

## 2021-11-30 MED ORDER — LOPERAMIDE HCL 2 MG PO CAPS
2.0000 mg | ORAL_CAPSULE | ORAL | Status: DC | PRN
  Administered 2021-12-01 – 2021-12-04 (×5): 2 mg via ORAL
  Filled 2021-11-30 (×5): qty 1

## 2021-11-30 MED ORDER — INSULIN GLARGINE-YFGN 100 UNIT/ML ~~LOC~~ SOLN
5.0000 [IU] | Freq: Every day | SUBCUTANEOUS | Status: DC
  Administered 2021-11-30 – 2021-12-01 (×2): 5 [IU] via SUBCUTANEOUS
  Filled 2021-11-30 (×3): qty 0.05

## 2021-11-30 MED ORDER — GLUCERNA SHAKE PO LIQD
237.0000 mL | Freq: Three times a day (TID) | ORAL | Status: DC
  Administered 2021-11-30 – 2021-12-05 (×13): 237 mL via ORAL
  Filled 2021-11-30 (×15): qty 237

## 2021-11-30 MED ORDER — CHLORHEXIDINE GLUCONATE CLOTH 2 % EX PADS
6.0000 | MEDICATED_PAD | Freq: Every day | CUTANEOUS | Status: DC
  Administered 2021-11-30 – 2021-12-04 (×5): 6 via TOPICAL

## 2021-11-30 NOTE — Consult Note (Signed)
WOC Nurse Consult Note: Reason for Consult:Stage 2 pressure injury to sacrum, irritant contact dermatitis to perineum related to incontinence  ICD-10 CM Codes for Irritant Dermatitis  L24A2 - Due to fecal, urinary or dual incontinence  Wound type:Pressure, moisture Pressure Injury POA: Yes Measurement:To be measured today and documented by Bedside RN onto Nursing Flow Sheet. Discussed with RN via [place]. Wound JQG:BEEF,EOFHQ Drainage (amount, consistency, odor) scant serosanguinous Periwound:intact, dry Dressing procedure/placement/frequency: House prevention measures as outlined in the standing order skin care order set are provided including bilateral Prevalon pressure redistribution heel boots, turning and repositioning to minimize time in the supine position, moisture barrier cream application to the perineal area and posterior and medial thighs. A silicone bordered foam is to be used for topical care to the partial thickness stage 2 pressure injury placed with the "tip" oriented pointing away from the anus. This will be changed every other day.  The assistance of the Bedside RN is appreciated.  [place] nursing team will not follow, but will remain available to this patient, the nursing and medical teams.  Please re-consult if needed.  Thank you for inviting [place] to participate in this patient's Plan of Care.  Maudie Flakes, MSN, RN, CNS, [place], Serita Grammes, [employer], [employer] phone:  [phone]

## 2021-11-30 NOTE — TOC Initial Note (Signed)
Transition of Care [facility]) - Initial/Assessment Note    Patient Details  Name: Charles [name] MRN: [id number] Date of Birth: 1960-08-24  Transition of Care [facility] CM/SW Contact:    Leeroy Cha, RN Phone Number: 11/30/2021, 8:03 AM  Clinical Narrative:                 Patient is from [facility].  Will require auth to return.  Expected Discharge Plan: [place] ([facility]) Barriers to Discharge: Continued Medical Work up   Patient Goals and CMS Choice Patient states their goals for this hospitalization and ongoing recovery are:: not stated CMS Medicare.gov Compare Post Acute Care list provided to:: Legal Guardian    Expected Discharge Plan and Services Expected Discharge Plan: [place] (from [facility])   Discharge Planning Services: CM Consult Post Acute Care Choice: [place] Living arrangements for the past 2 months: [place]                                      Prior Living Arrangements/Services Living arrangements for the past 2 months: [place] Lives with:: Facility Resident Patient language and need for interpreter reviewed:: Yes Do you feel safe going back to the place where you live?: Yes               Activities of Daily Living      Permission Sought/Granted                  Emotional Assessment Appearance:: Appears stated age Attitude/Demeanor/Rapport: Gracious Affect (typically observed): Calm Orientation: : Oriented to Self, Oriented to Place, Oriented to Situation Alcohol / Substance Use: Never Used Psych Involvement: No (comment)  Admission diagnosis:  Acute cystitis without hematuria [C62.37] Acute metabolic encephalopathy [S28.31] Patient Active Problem List   Diagnosis Date Noted   Rib fractures 11/30/2021   Multiple fractures of ribs, left side, initial encounter for closed fracture 11/29/2021   Failure to thrive in adult 11/29/2021    Anemia of chronic disease 09/10/2021   Catheter-associated urinary tract infection ([place]) 08/27/2021   Goals of care, counseling/discussion 08/26/2021   Delirium 08/23/2021   Confusion 08/23/2021   Unintentional weight loss of more than 10% body weight within 6 months 08/21/2021   Hemorrhoids 08/21/2021   Diverticulosis of colon 08/21/2021   Solitary rectal ulcer 08/21/2021   Polyp of ascending colon 08/21/2021   Hypokalemia 08/21/2021   Acute blood loss anemia 08/20/2021   Chronic orthostatic hypotension 08/20/2021   Protein-calorie malnutrition, severe ([place]) 08/20/2021   Hypomagnesemia 08/20/2021   Physical deconditioning 08/20/2021   Acute lower GI bleeding 08/19/2021   Pressure injury of skin 07/02/2021   Hypothermia 06/30/2021   Urinary retention 04/23/2021   Acute prerenal azotemia 04/22/2021   Leukocytosis 04/22/2021   HLD (hyperlipidemia) 51/76/1607   Acute metabolic encephalopathy 37/12/6267   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/23/2021   Insulin dependent type 2 diabetes mellitus ([place]) 03/23/2021   BPH (benign prostatic hyperplasia) 02/08/2021   Phimosis 01/27/2021   Hypertension 12/23/2020   CAD S/p remote CABG 12/23/2020   PCP:  [name], [name], MD Pharmacy:   [facility] [place] [address] [place] [address] Phone: [phone] Fax: [phone]     Social Determinants of Health (SDOH) Interventions    Readmission Risk Interventions   Row Labels 07/04/2021  2:49 PM  Readmission Risk Prevention Plan   Section Header. No data exists in this row.   Transportation Screening   Complete  PCP or Specialist Appt within 5-7 Days   Complete  Home Care Screening   Complete

## 2021-11-30 NOTE — Assessment & Plan Note (Signed)
Recent fractures noted incidentally on CXR at posterolateral left fourth and fifth ribs.

## 2021-11-30 NOTE — Assessment & Plan Note (Signed)
Foley exchanged in ER

## 2021-11-30 NOTE — Progress Notes (Addendum)
Progress Note   [name] VOZ:[id number] DOB: 1960-04-07 DOA: 11/29/2021     0 DOS: the patient was seen and examined on 11/30/2021 at 10:30AM      Brief hospital course: Mr. Reidinger is an unfortunate 61 y.o. M with hx IDDM, CAD s/p CABG, orthostasis on midodrine, anemia and BPH now with chronic indwelling foley as well as progressive dementia, leg weakness and failure to thrive who presented with confusion.     9/19: Admitted, UA appeared infected, foley replaced and started on antibiotics 9/20: No improvement     Assessment and Plan: * Catheter-associated urinary tract infection (HCC) BPH/BOO with chronic indwelling Foley catheter Foley exchanged in the ED. - Continue Rocephin - Follow urine culture      Acute metabolic encephalopathy At baseline, has mild cognitive impairment, but is interactive, oriented, approrpatie, can transfer with assistance and provide basic self cares.    Today, he remains oriented only to self.  No focal deficits to suggest stroke.  Extensive work up for identical presentation in April were notable for normal brain MRI, EEG, TSH, RPR, B12, HIV, folate, and acute hepatitis panel. - Treat underlygin infection - Continue IV fluids    Insulin dependent type 2 diabetes mellitus (HCC) Glucose remains high - Resume glargine - Continue SS corrections        Dementia Failure to thrive in adult Protein-calorie malnutrition, severe Patient has had "precipitous decline" in last 9 months, both physically and mentally.  It seems that within the last year even, he was still working.    He has been evaluated by Neurology (Dr. Leta Baptist several times in the office and also in consultation in the hospital) and I am surprised no diagnosis of neurodegenerative disease was made, because it certainly seems like the time course and severity of it are more than the nominal working diagnosis of "diabetes related complications" which is what all of  his symptoms have been attributed to, and which is certainly possible.  Regardless, extensive work up for reversible/correctable causes of dementia and weakness and weight loss by hospitalists/Neurology/GI have all been negative.   Since May, has lost >20% of body weight and is now with severe diffuse loss of subcutaneous muscle mass and fat.     Appears Hospice appropriate.  Sister [name] is involved in his care and is arranging for him to move to [facility] in 2 weeks, to be close to her. - Consult Palliative Care - Continue Glucerna     Multiple fractures of ribs, left side, initial encounter for closed fracture X-ray shows recent fractures in the posterior lateral aspects of left fourth and fifth ribs.  No pneumothorax. - Incentive spirometer and supplemental oxygen as needed  Anemia of chronic disease Hemoglobin stable    Pressure injury of sacral region, stage 2 (HCC) Present on admission  Chronic orthostatic hypotension - Continue midodrine.  HLD (hyperlipidemia) - Continue rosuvastatin and Zetia  BPH (benign prostatic hyperplasia) Foley exchanged in ER  CAD S/p remote CABG - Continue aspirin, rosuvastatin, Zetia.          Subjective: Patient oriented to self only.  No fever.  No focal weakness, no respiratory distress, no cough, no vomiting.  Nursing report no concerns.     Physical Exam: BP 108/64 (BP Location: Left Arm)   Pulse 91   Temp 97.8 F (36.6 C) (Oral)   Resp 14   SpO2 100%   Cachectic adult male, hunched on the side, opens eyes and looks at me,  mumbles his name, no other intelligible verbalizations. Heart rate slightly fast, regular, no murmurs, no peripheral edema Respiratory rate normal, lungs clear without rales or wheezes Abdomen soft without tenderness palpation or guarding, no ascites or distention Attention diminished, affect blunted, oriented to self only, no other intelligible verbalizations, extremely weak generally, no focal  weakness, face symmetric, eyes cross the midline, speech seems fluent but is little he is able to say    Data Reviewed: COVID-negative Glucose slightly elevated Basic metabolic panel and LFTs normal Complete blood count shows mild anemia Lactulose normal X-ray shows some rib fractures, no pneumonia    Family Communication: Sister by phone    Disposition: Status is: Observation The patient was admitted for acute metabolic encephalopathy, he remains densely encephalopathic, and I will continue IV antibiotics and IV fluids.        Author: Edwin Dada, MD 11/30/2021 1:46 PM  For on call review [email or web address].

## 2021-11-30 NOTE — Assessment & Plan Note (Signed)
Present on admission

## 2021-12-01 DIAGNOSIS — Z7189 Other specified counseling: Secondary | ICD-10-CM | POA: Diagnosis not present

## 2021-12-01 DIAGNOSIS — G9341 Metabolic encephalopathy: Secondary | ICD-10-CM | POA: Diagnosis not present

## 2021-12-01 DIAGNOSIS — D638 Anemia in other chronic diseases classified elsewhere: Secondary | ICD-10-CM

## 2021-12-01 DIAGNOSIS — F0390 Unspecified dementia without behavioral disturbance: Secondary | ICD-10-CM

## 2021-12-01 DIAGNOSIS — N3000 Acute cystitis without hematuria: Secondary | ICD-10-CM | POA: Diagnosis not present

## 2021-12-01 DIAGNOSIS — N401 Enlarged prostate with lower urinary tract symptoms: Secondary | ICD-10-CM

## 2021-12-01 DIAGNOSIS — T83511A Infection and inflammatory reaction due to indwelling urethral catheter, initial encounter: Secondary | ICD-10-CM | POA: Diagnosis not present

## 2021-12-01 DIAGNOSIS — N138 Other obstructive and reflux uropathy: Secondary | ICD-10-CM

## 2021-12-01 DIAGNOSIS — I2510 Atherosclerotic heart disease of native coronary artery without angina pectoris: Secondary | ICD-10-CM

## 2021-12-01 LAB — COMPREHENSIVE METABOLIC PANEL
ALT: 17 U/L (ref 0–44)
AST: 23 U/L (ref 15–41)
Albumin: 2.2 g/dL — ABNORMAL LOW (ref 3.5–5.0)
Alkaline Phosphatase: 100 U/L (ref 38–126)
Anion gap: 6 (ref 5–15)
BUN: 12 mg/dL (ref 6–20)
CO2: 25 mmol/L (ref 22–32)
Calcium: 8.4 mg/dL — ABNORMAL LOW (ref 8.9–10.3)
Chloride: 108 mmol/L (ref 98–111)
Creatinine, Ser: 0.68 mg/dL (ref 0.61–1.24)
GFR, Estimated: >60 mL/min (ref >60)
Glucose, Bld: 146 mg/dL — ABNORMAL HIGH (ref 70–99)
Potassium: 3.2 mmol/L — ABNORMAL LOW (ref 3.5–5.1)
Sodium: 139 mmol/L (ref 135–145)
Total Bilirubin: 0.4 mg/dL (ref 0.3–1.2)
Total Protein: 6.1 g/dL — ABNORMAL LOW (ref 6.5–8.1)

## 2021-12-01 LAB — CBC
HCT: 29.3 % — ABNORMAL LOW (ref 39.0–52.0)
Hemoglobin: 9.3 g/dL — ABNORMAL LOW (ref 13.0–17.0)
MCH: 26.7 pg (ref 26.0–34.0)
MCHC: 31.7 g/dL (ref 30.0–36.0)
MCV: 84.2 fL (ref 80.0–100.0)
Platelets: 329 10*3/uL (ref 150–400)
RBC: 3.48 MIL/uL — ABNORMAL LOW (ref 4.22–5.81)
RDW: 15.9 % — ABNORMAL HIGH (ref 11.5–15.5)
WBC: 8.9 10*3/uL (ref 4.0–10.5)
nRBC: 0 % (ref 0.0–0.2)

## 2021-12-01 LAB — URINE CULTURE: Culture: >=100000 — AB

## 2021-12-01 LAB — GLUCOSE, CAPILLARY
Glucose-Capillary: 149 mg/dL — ABNORMAL HIGH (ref 70–99)
Glucose-Capillary: 219 mg/dL — ABNORMAL HIGH (ref 70–99)
Glucose-Capillary: 178 mg/dL — ABNORMAL HIGH (ref 70–99)
Glucose-Capillary: 223 mg/dL — ABNORMAL HIGH (ref 70–99)

## 2021-12-01 MED ORDER — POTASSIUM CHLORIDE IN NACL 20-0.9 MEQ/L-% IV SOLN
INTRAVENOUS | Status: DC
  Filled 2021-12-01 (×9): qty 1000

## 2021-12-01 MED ORDER — POTASSIUM CHLORIDE 10 MEQ/100ML IV SOLN
10.0000 meq | INTRAVENOUS | Status: AC
  Administered 2021-12-01 (×4): 10 meq via INTRAVENOUS
  Filled 2021-12-01 (×4): qty 100

## 2021-12-01 MED ORDER — PIPERACILLIN-TAZOBACTAM 3.375 G IVPB
3.3750 g | Freq: Three times a day (TID) | INTRAVENOUS | Status: DC
  Administered 2021-12-01 – 2021-12-05 (×12): 3.375 g via INTRAVENOUS
  Filled 2021-12-01 (×12): qty 50

## 2021-12-01 NOTE — Consult Note (Signed)
Consultation Note Date: 12/01/2021   Patient Name: Charles [name]  DOB: 1960-07-01  MRN: [id number]  Age / Sex: 62 y.o., male  PCP: [name], [name], MD Referring Physician: Edwin Dada, *  Reason for Consultation:   HPI/Patient Profile: 61 y.o. male  with past medical history of neurologic decline related to complications of diabetes- has had extensive neuro workup, CAD s/p CABG, BOO, BPH with chronic foley admitted on 11/29/2021 from SNF with altered mental status, UTI. This is his second hospitalization this year for UTI. He was also hospitalized in June with GI bleed. Palliative consulted for [place].    Primary Decision Maker NEXT OF KIN - sister- Charles [name]  Discussion: Chart reviewed including labs, progress notes from this and prior admissions.  Evaluated patient- he was awake and alert, disoriented. Tells me he is in [place] in an office building awaiting to meet with a Chiropodist.  He appears frail, cachectic.  He was seen  by Palliative during his June admission and at that time goals of care were full scope, full code.  Called and spoke with his sister, [name]. She visited him about 2 weeks ago and felt that he was doing well.  We discussed his acute and chronic illnesses, possible trajectories and goals of care.  Advanced care planning was discussed- Charles [name] wishes to continue hospitalizations and treating what is treatable.  She thought he had a DNR order in place- we discussed he doesn't currently but it is very appropriate given his overall decline and likely trajectory of continued decline.  Her hopes are for him to stabilize and then she plans to have him moved to [place] in two weeks. She has family that is going to assist with transporting him.  She is agreeable to outpatient Palliative follow-up.  SUMMARY OF RECOMMENDATIONS -Continue current care with goal of stabilization,  discharge and d/c to SNF with ultimate goal of moving to [place] to be closer to sister -DNR -Outpatient palliative- I am concerned about his stability to travel given his poor functional and cognitive status- will be good to be followed by outpatient Palliative to ensure this goal can be met and if not then aid in further [place] discussions    Code Status/Advance Care Planning: DNR   Prognosis:   Unable to determine  Discharge Planning: back to SNF with Palliative  Primary Diagnoses: Present on Admission:  Acute metabolic encephalopathy  Catheter-associated urinary tract infection ([place])  Multiple fractures of ribs, left side, initial encounter for closed fracture  Anemia of chronic disease  HLD (hyperlipidemia)  CAD S/p remote CABG  BPH (benign prostatic hyperplasia)  Chronic orthostatic hypotension  Failure to thrive in adult  Protein-calorie malnutrition, severe ([place])  Hypokalemia   Review of Systems  Unable to perform ROS: Mental status change    Physical Exam Vitals and nursing note reviewed.  Constitutional:      General: He is not in acute distress.    Comments: frail  Neurological:     Mental Status: He is alert. He is  disoriented.     Vital Signs: BP 100/83 (BP Location: Left Arm)   Pulse 74   Temp 99.1 F (37.3 C) (Oral)   Resp 18   Wt 45.6 kg   SpO2 100%   BMI 15.74 kg/m  Pain Scale: Faces       SpO2: SpO2: 100 % O2 Device:SpO2: 100 % O2 Flow Rate: .   IO: Intake/output summary:  Intake/Output Summary (Last 24 hours) at 12/01/2021 1515 Last data filed at 12/01/2021 1514 Gross per 24 hour  Intake 260 ml  Output 1950 ml  Net -1690 ml    LBM: Last BM Date : 12/01/21 Baseline Weight: Weight: 45.6 kg Most recent weight: Weight: 45.6 kg       Thank you for this consult. Palliative medicine will continue to follow and assist as needed.   Greater than 50%  of this time was spent counseling and coordinating care related to the above assessment  and plan.  Signed by:  , AGNP-C Palliative Medicine    Please contact Palliative Medicine Team phone at [phone] for questions and concerns.  For individual provider: See Amion               

## 2021-12-01 NOTE — Plan of Care (Signed)
  Problem: Education: Goal: Knowledge of General Education information will improve Description: Including pain rating scale, medication(s)/side effects and non-pharmacologic comfort measures Outcome: Not Progressing   Problem: Health Behavior/Discharge Planning: Goal: Ability to manage health-related needs will improve Outcome: Not Progressing   Problem: Clinical Measurements: Goal: Ability to maintain clinical measurements within normal limits will improve Outcome: Progressing Goal: Will remain free from infection Outcome: Progressing Goal: Diagnostic test results will improve Outcome: Progressing Goal: Respiratory complications will improve Outcome: Progressing Goal: Cardiovascular complication will be avoided Outcome: Progressing   Problem: Activity: Goal: Risk for activity intolerance will decrease Outcome: Not Progressing   Problem: Nutrition: Goal: Adequate nutrition will be maintained Outcome: Progressing   Problem: Coping: Goal: Level of anxiety will decrease Outcome: Progressing   Problem: Elimination: Goal: Will not experience complications related to bowel motility Outcome: Progressing Goal: Will not experience complications related to urinary retention Outcome: Progressing   Problem: Pain Managment: Goal: General experience of comfort will improve Outcome: Progressing   Problem: Safety: Goal: Ability to remain free from injury will improve Outcome: Progressing   Problem: Skin Integrity: Goal: Risk for impaired skin integrity will decrease Outcome: Progressing   Problem: Coping: Goal: Ability to adjust to condition or change in health will improve Outcome: Progressing   Problem: Fluid Volume: Goal: Ability to maintain a balanced intake and output will improve Outcome: Progressing   Problem: Nutritional: Goal: Maintenance of adequate nutrition will improve Outcome: Progressing Goal: Progress toward achieving an optimal weight will  improve Outcome: Progressing   Problem: Skin Integrity: Goal: Risk for impaired skin integrity will decrease Outcome: Progressing   Problem: Tissue Perfusion: Goal: Adequacy of tissue perfusion will improve Outcome: Progressing

## 2021-12-01 NOTE — Progress Notes (Signed)
  Progress Note   [name] [id number]:[id number] DOB: Apr 18, 1960 DOA: 11/29/2021     1 DOS: the patient was seen and examined on 12/01/2021 at 11:45AM      Brief hospital course: Charles [name] is an unfortunate 61 y.o. M with hx IDDM, CAD s/p CABG, orthostasis on midodrine, anemia and BPH now with chronic indwelling foley as well as progressive dementia, leg weakness and failure to thrive who presented with confusion.     9/19: Admitted, UA appeared infected, foley replaced and started on antibiotics 9/20: No improvement 9/21: Improving mentation, noted to have right sided weakness     Assessment and Plan: * Catheter-associated urinary tract infection ([place]) BPH/BOO with chronic indwelling Foley catheter Foley exchanged in the ED. Urine growing morganella and Ecoli - Broaden to [employer] for morganella - Follow urine culture to finalized     Acute metabolic encephalopathy See prior note  Today, more alert and able to follow commands and more mobile in bed.  Noted by OT to have marked spasticity on right, that was not observed by me. - Obtain MRI brain - Continue IV fluids - Continue antibiotics    Insulin dependent type 2 diabetes mellitus (HCC) Glucose mostly controlled - Continue glargine - Continue SS corrections   Dementia (HCC) Failure to thrive in adult Protein-calorie malnutrition, severe ([place]) See prior summary  - Consult Palliative Care - Continue Glucerna     Hypokalemia - Supplement K   Chronic orthostatic hypotension - Continue midodrine.  HLD (hyperlipidemia) - Continue rosuvastatin and Zetia   CAD S/p remote CABG - Continue aspirin, rosuvastatin, Zetia.          Subjective: Patient states he has no appetite, he still seems to have some psychomotor slowing.  Occupational therapy this afternoon of note is that he seems more spastic on the right side.  No fever, vomiting, respiratory distress, speech change.     Physical  Exam: BP 100/83 (BP Location: Left Arm)   Pulse 74   Temp 99.1 F (37.3 C) (Oral)   Resp 18   Wt 45.6 kg   SpO2 100%   BMI 15.74 kg/m   Cachectic elderly adult male, lying in bed, curled on his side, RRR, no murmurs, no peripheral edema Respiratory rate normal, lungs clear without rales or wheezes Abdomen soft without tenderness palpation or guarding, no ascites or distention Mostly does not comply with exam, seems extremely debilitated and weak, face symmetric, speech fluent although halting and with some psychomotor slowing that clear exam    Data Reviewed: Basic metabolic panel shows hypokalemia, normal renal function Urine culture growing Morganella and E. coli Hemogram shows hemoglobin 9, no change      Disposition: Status is: Inpatient         Author: Edwin Dada, MD 12/01/2021 4:32 PM  For on call review [email or web address].

## 2021-12-01 NOTE — Progress Notes (Signed)
PHARMACY NOTE -  [facility] has been assisting with dosing of Zosyn for UTI. Dosage remains stable at 3.375 g IV q8 hr and further renal adjustments per institutional Pharmacy antibiotic protocol   Pharmacy will sign off, following peripherally for culture results or dose adjustments. Please reconsult if a change in clinical status warrants re-evaluation of dosage.  Reuel Boom, PharmD, BCPS [phone] 12/01/2021, 2:45 PM

## 2021-12-01 NOTE — Evaluation (Signed)
Occupational Therapy Evaluation Patient Details Name: Charles [name] MRN: [id number] DOB: 1960-04-25 Today's Date: 12/01/2021   History of Present Illness Charles [name] is an unfortunate 61 y.o. M with hx IDDM, CAD s/p CABG, orthostasis on midodrine, anemia and BPH now with chronic indwelling foley as well as progressive dementia, leg weakness and failure to thrive who presented with confusion   Clinical Impression   Charles [name] is a 61 year old man who presents with cognitive impairments, generalized weakness, decreased activity tolerance, impaired balance, and decreased functional use of right dominant upper extremity and right lower extremity. Assessment of upper and lower extremities limited due to patient's cognition. Patient able to grossly reach out and hold a drink with left arm. Doesn't exhibit ability to reach out, elbow noted to have a catch and release in elbow but able to grossly open and close fingers. Twitching noted in right upper and lower extremity. Patient total assist to transfer to edge of bed with max assist to keep him upright due to right lateral lean. His neck is laterally flexed to the right. He was able to grossly kick out his left leg but not his right. He was total assist for return to bed and found to be incontinent. He is total assist for toileting, UB and LB bathing and dressing and max assist for upper body dressing. He is max assist for feeding and grooming. Facility reports he was able to feed himself, carry on a conversation, and pivot to wheelchair with assistance. Patient will benefit from skilled OT services while in hospital to improve deficits and learn compensatory strategies as needed in order to return to PLOF.  Recommend return to facility at discharge.      Recommendations for follow up therapy are one component of a multi-disciplinary discharge planning process, led by the attending physician.  Recommendations may be updated based on patient status,  additional functional criteria and insurance authorization.   Follow Up Recommendations  Skilled nursing-short term rehab (<3 hours/day)    Assistance Recommended at Discharge Frequent or constant Supervision/Assistance  Patient can return home with the following A lot of help with bathing/dressing/bathroom;Two people to help with walking and/or transfers;Assistance with cooking/housework;Direct supervision/assist for financial management;Assist for transportation;Help with stairs or ramp for entrance    Functional Status Assessment  Patient has had a recent decline in their functional status and/or demonstrates limited ability to make significant improvements in function in a reasonable and predictable amount of time  Equipment Recommendations  None recommended by OT    Recommendations for Other Services       Precautions / Restrictions Precautions Precautions: Fall Precaution Comments: indwelling catheter, incontinent of BM Restrictions Weight Bearing Restrictions: No      Mobility Bed Mobility Overal bed mobility: Needs Assistance Bed Mobility: Supine to Sit, Sit to Supine     Supine to sit: Total assist Sit to supine: Total assist, +2 for safety/equipment        Transfers                          Balance Overall balance assessment: Needs assistance Sitting-balance support: No upper extremity supported, Feet supported Sitting balance-Leahy Scale: Zero   Postural control: Right lateral lean     Standing balance comment: not tested                           ADL either performed or assessed with clinical  judgement   ADL Overall ADL's : Needs assistance/impaired Eating/Feeding: Maximal assistance;Sitting Eating/Feeding Details (indicate cue type and reason): able to hold drink with left hand and sip out of cup sitting at edge of bed with support Grooming: Maximal assistance;Sitting Grooming Details (indicate cue type and reason): at edge of  bed with support Upper Body Bathing: Total assistance;Bed level   Lower Body Bathing: Total assistance;Bed level   Upper Body Dressing : Maximal assistance;Bed level   Lower Body Dressing: Total assistance;Bed level   Toilet Transfer: Total assistance;BSC/3in1   Toileting- Clothing Manipulation and Hygiene: Total assistance;Bed level       Functional mobility during ADLs: Total assistance General ADL Comments: Patient total assist to transfer to edge of bed with +1 assist. +2 to return to supine.     Vision   Vision Assessment?: No apparent visual deficits     Perception     Praxis      Pertinent Vitals/Pain Pain Assessment Pain Assessment: PAINAD Faces Pain Scale: Hurts a little bit Breathing: normal Negative Vocalization: occasional moan/groan, low speech, negative/disapproving quality Facial Expression: sad, frightened, frown Body Language: relaxed Consolability: no need to console PAINAD Score: 2 Pain Location: R shoulder with PROM Pain Descriptors / Indicators: Grimacing Pain Intervention(s): Limited activity within patient's tolerance     Hand Dominance Right   Extremity/Trunk Assessment Upper Extremity Assessment Upper Extremity Assessment: RUE deficits/detail;LUE deficits/detail;Difficult to assess due to impaired cognition RUE Deficits / Details: Decreased shoulder ROM, increased tone (catch and release) in elbow , able to grossly open and close fingers. Didn't exhibit functional reach in RUE RUE Sensation:  (unabel to assess due to cognition) RUE Coordination: decreased fine motor;decreased gross motor LUE Deficits / Details: grossly functional PROM, able to reach and retrieve drink in front of him and hold container. Able to open and close fingers. No tone notified. LUE Sensation:  (unable to assess due to cognition)   Lower Extremity Assessment Lower Extremity Assessment: Defer to PT evaluation   Cervical / Trunk Assessment Cervical / Trunk  Assessment:  (right lateral neck flexion tightness/contraction)   Communication Communication Communication: Expressive difficulties   Cognition Arousal/Alertness: Awake/alert Behavior During Therapy: Flat affect Overall Cognitive Status: Difficult to assess                                 General Comments: Unsure of prior cognition. Patient's verbalizations minimal today.Per facility he is able to talk fluently.     General Comments       Exercises     Shoulder Instructions      Home Living Family/patient expects to be discharged to:: Skilled nursing facility                                 Additional Comments: At [facility] since April.      Prior Functioning/Environment               Mobility Comments: pivoted with 1 assist to wc. But mostly in the bed per facility. ADLs Comments: Assistance for ADLs. Incontinent. Could feed himself. Able to have fluent conversation typically.        OT Problem List: Decreased strength;Decreased range of motion;Decreased activity tolerance;Impaired balance (sitting and/or standing);Decreased coordination;Decreased cognition;Decreased safety awareness;Decreased knowledge of use of DME or AE;Pain;Impaired UE functional use;Impaired sensation;Impaired tone      OT Treatment/Interventions: Self-care/ADL training;Therapeutic  exercise;Neuromuscular education;DME and/or AE instruction;Cognitive remediation/compensation;Therapeutic activities;Balance training;Patient/family education;Manual therapy    OT Goals(Current goals can be found in the care plan section) Acute Rehab OT Goals OT Goal Formulation: Patient unable to participate in goal setting Time For Goal Achievement: 12/15/21 Potential to Achieve Goals: Fair  OT Frequency: Min 2X/week    Co-evaluation              AM-PAC OT "6 Clicks" Daily Activity     Outcome Measure Help from another person eating meals?: A Lot Help from another person  taking care of personal grooming?: A Lot Help from another person toileting, which includes using toliet, bedpan, or urinal?: Total Help from another person bathing (including washing, rinsing, drying)?: Total Help from another person to put on and taking off regular upper body clothing?: A Lot Help from another person to put on and taking off regular lower body clothing?: Total 6 Click Score: 9   End of Session Nurse Communication: Mobility status  Activity Tolerance: Patient tolerated treatment well Patient left: in bed;with call bell/phone within reach  OT Visit Diagnosis: Muscle weakness (generalized) (M62.81);Adult, failure to thrive (R62.7)                Time: 3546-5681 OT Time Calculation (min): 17 min Charges:  OT General Charges $OT Visit: 1 Visit OT Evaluation $OT Eval Moderate Complexity: 1 Mod  Donnella Sham, OTR/L Acute Care Rehab Services  Office [phone]   Kelli Churn 12/01/2021, 4:52 PM

## 2021-12-01 NOTE — Assessment & Plan Note (Signed)
Resolved with supplementation and starting spironolactone. 

## 2021-12-02 ENCOUNTER — Inpatient Hospital Stay (HOSPITAL_COMMUNITY): Payer: Commercial Managed Care - HMO

## 2021-12-02 DIAGNOSIS — T83511A Infection and inflammatory reaction due to indwelling urethral catheter, initial encounter: Secondary | ICD-10-CM | POA: Diagnosis not present

## 2021-12-02 DIAGNOSIS — N390 Urinary tract infection, site not specified: Secondary | ICD-10-CM | POA: Diagnosis not present

## 2021-12-02 LAB — GLUCOSE, CAPILLARY
Glucose-Capillary: 100 mg/dL — ABNORMAL HIGH (ref 70–99)
Glucose-Capillary: 54 mg/dL — ABNORMAL LOW (ref 70–99)
Glucose-Capillary: 130 mg/dL — ABNORMAL HIGH (ref 70–99)
Glucose-Capillary: 243 mg/dL — ABNORMAL HIGH (ref 70–99)
Glucose-Capillary: 210 mg/dL — ABNORMAL HIGH (ref 70–99)

## 2021-12-02 LAB — BASIC METABOLIC PANEL WITH GFR
Anion gap: 5 (ref 5–15)
BUN: 9 mg/dL (ref 6–20)
CO2: 23 mmol/L (ref 22–32)
Calcium: 8.2 mg/dL — ABNORMAL LOW (ref 8.9–10.3)
Chloride: 107 mmol/L (ref 98–111)
Creatinine, Ser: 0.55 mg/dL — ABNORMAL LOW (ref 0.61–1.24)
GFR, Estimated: >60 mL/min
Glucose, Bld: 56 mg/dL — ABNORMAL LOW (ref 70–99)
Potassium: 3.6 mmol/L (ref 3.5–5.1)
Sodium: 135 mmol/L (ref 135–145)

## 2021-12-02 LAB — CBC
HCT: 27.6 % — ABNORMAL LOW (ref 39.0–52.0)
Hemoglobin: 8.8 g/dL — ABNORMAL LOW (ref 13.0–17.0)
MCH: 27.1 pg (ref 26.0–34.0)
MCHC: 31.9 g/dL (ref 30.0–36.0)
MCV: 84.9 fL (ref 80.0–100.0)
Platelets: 322 10*3/uL (ref 150–400)
RBC: 3.25 MIL/uL — ABNORMAL LOW (ref 4.22–5.81)
RDW: 15.4 % (ref 11.5–15.5)
WBC: 9.3 10*3/uL (ref 4.0–10.5)
nRBC: 0 % (ref 0.0–0.2)

## 2021-12-02 NOTE — Progress Notes (Signed)
Hypoglycemic Event  CBG: 54  Treatment: 8 oz juice/soda  Symptoms: None  Follow-up CBG: Time:0725 CBG Result: 100  Possible Reasons for Event: Unknown  Comments/MD notified: Danford, MD notified    Manuella Ghazi

## 2021-12-02 NOTE — Evaluation (Signed)
Physical Therapy Evaluation Patient Details Name: Charles [name] MRN: [id number] DOB: February 12, 1961 Today's Date: 12/02/2021  History of Present Illness  Pt is a64yo male presenting on 9/19 from SNF with confusion and AMS. CXR revealed recent L 4th & 5th rib fxs.   PMH: DM, HTN, CAD s/p CABG, chronic indwelling foley, progressive dementia, leg weakness, failure to thrive.  Clinical Impression  Pt presents with the problems listed above and functional impairments below. Pt alert and oriented only to self and reporting no pain. Required total assist +2 for bed mobility and transfers, found to be incontinent of bowel so completed sit to stand transfer with steady x4 for pericare, multiple attempts secondary to pt fatigue. Pt demonstrated RUE and RLE tone in catch and release pattern with steady, "beat-like" firing approximately 1x/sec, pt reporting awareness of muscular firing but inability to control it. Pt generally very cachectic in all extremities and demonstrated BLE knee flexion contractures at ~45deg. Recommending return to SNF-level therapies upon discharge. We will continue to follow him acutely.      Recommendations for follow up therapy are one component of a multi-disciplinary discharge planning process, led by the attending physician.  Recommendations may be updated based on patient status, additional functional criteria and insurance authorization.  Follow Up Recommendations Skilled nursing-short term rehab (<3 hours/day) Can patient physically be transported by private vehicle: No    Assistance Recommended at Discharge Frequent or constant Supervision/Assistance  Patient can return home with the following  Two people to help with walking and/or transfers;Two people to help with bathing/dressing/bathroom;Assistance with cooking/housework;Assistance with feeding;Direct supervision/assist for medications management;Direct supervision/assist for financial management;Assist for  transportation;Help with stairs or ramp for entrance    Equipment Recommendations None recommended by PT  Recommendations for Other Services       Functional Status Assessment Patient has had a recent decline in their functional status and demonstrates the ability to make significant improvements in function in a reasonable and predictable amount of time.     Precautions / Restrictions Precautions Precautions: Fall Precaution Comments: indwelling catheter, incontinent of BM Restrictions Weight Bearing Restrictions: No      Mobility  Bed Mobility Overal bed mobility: Needs Assistance Bed Mobility: Supine to Sit, Sit to Supine     Supine to sit: Total assist Sit to supine: Total assist, +2 for safety/equipment, +2 for physical assistance   General bed mobility comments: Pt required total assist +2 for bed mobility.    Transfers Overall transfer level: Needs assistance Equipment used: Ambulation equipment used Transfers: Sit to/from Stand Sit to Stand: Total assist, +2 safety/equipment, +2 physical assistance           General transfer comment: Pt total assist +2 for sit to stand transfer with steady, required total assist to maintain upright stance for pericare. Pt completed transfer x4 secondary to pt fatigue, pt exhibited some motor control: was able to reach up to [facility] bar and pull up but not enough to categorize PT assist as Max. Pt incontinent of bowel and was unaware, some sacral pressure wounding, RN notified. Return pt to supine positioning.    Ambulation/Gait               General Gait Details: unsafe at present  Stairs            Wheelchair Mobility    Modified Rankin (Stroke Patients Only)       Balance Overall balance assessment: Needs assistance Sitting-balance support: No upper extremity supported, Feet supported Sitting balance-Leahy Scale:  Zero   Postural control: Right lateral lean, Posterior lean (requires max assist to correct,  pt unable to self-correct. Occasionally posterior lean as well.) Standing balance support: Reliant on assistive device for balance, During functional activity, Bilateral upper extremity supported Standing balance-Leahy Scale: Zero Standing balance comment: Pt unable to maintain standing balance without total assist                             Pertinent Vitals/Pain Pain Assessment Pain Assessment: No/denies pain Faces Pain Scale: Hurts a little bit Breathing: normal Negative Vocalization: none Facial Expression: smiling or inexpressive Body Language: relaxed Consolability: no need to console PAINAD Score: 0 Pain Location: R shoulder with PROM, RLE with PROM Pain Descriptors / Indicators: Grimacing Pain Intervention(s): Monitored during session, Patient requesting pain meds-RN notified    Home Living Family/patient expects to be discharged to:: Skilled nursing facility                   Additional Comments: At [facility] since April.    Prior Function Prior Level of Function : Independent/Modified Independent             Mobility Comments: Pt pivoted with +1 assist to WC. But mostly in the bed per facility. ADLs Comments: Assistance for ADLs. Incontinent. Could feed himself. Able to have fluent conversation typically.     Hand Dominance   Dominant Hand: Right    Extremity/Trunk Assessment   Upper Extremity Assessment Upper Extremity Assessment: Defer to OT evaluation RUE Deficits / Details: Pt's bicep and long wrist extensors firing in a measured, beat-like pattern at rest, increased tone with passive movement. Pt reports he is aware of the movement but is not controlling it. LUE Deficits / Details: Tone more normal and ROM functional    Lower Extremity Assessment Lower Extremity Assessment: RLE deficits/detail;LLE deficits/detail;Generalized weakness;Difficult to assess due to impaired cognition RLE Deficits / Details: Pt's PFs firing in  beat-like tone approximately 1x/sec, continues with attempts to weightbear and with PROM. Pt unable to fully extend legs, knee flexion contracture starts at ~45deg flexion, able to push past with PT pressure to ~30deg. Pt very cachexic. negative clonus, negative babinski. RLE Sensation:  (difficult to assess secondary to cog) LLE Deficits / Details: Pt unable to fully extend legs, knee flexion contracture starts at ~45deg flexion, able to push past with PT pressure to ~30deg. Pt very cachexic. negative clonus, negative babinski. LLE Sensation:  (Pt reports decreased sensation on L>R)    Cervical / Trunk Assessment Cervical / Trunk Assessment: Kyphotic (right lateral neck flexion tightness/contraction, pt hold neck in flexion)  Communication   Communication: Expressive difficulties  Cognition Arousal/Alertness: Awake/alert Behavior During Therapy: Flat affect Overall Cognitive Status: Difficult to assess                                 General Comments: Pt Alert, Oriented only to self (name and birthday), cannot report month or facility without cuing. Per MD yesterday he could name [facility].        General Comments      Exercises     Assessment/Plan    PT Assessment Patient needs continued PT services  PT Problem List Decreased range of motion;Decreased strength;Decreased activity tolerance;Decreased balance;Decreased mobility;Decreased coordination;Decreased cognition;Impaired sensation;Pain;Impaired tone       PT Treatment Interventions DME instruction;Gait training;Stair training;Functional mobility training;Therapeutic activities;Therapeutic exercise;Balance training;Neuromuscular  re-education;Cognitive remediation;Patient/family education;Wheelchair mobility training    PT Goals (Current goals can be found in the Care Plan section)  Acute Rehab PT Goals Patient Stated Goal: none stated PT Goal Formulation: Patient unable to participate in goal setting Time  For Goal Achievement: 12/16/21 Potential to Achieve Goals: Good    Frequency Min 3X/week     Co-evaluation               AM-PAC PT "6 Clicks" Mobility  Outcome Measure Help needed turning from your back to your side while in a flat bed without using bedrails?: Total Help needed moving from lying on your back to sitting on the side of a flat bed without using bedrails?: Total Help needed moving to and from a bed to a chair (including a wheelchair)?: Total Help needed standing up from a chair using your arms (e.g., wheelchair or bedside chair)?: Total Help needed to walk in hospital room?: Total Help needed climbing 3-5 steps with a railing? : Total 6 Click Score: 6    End of Session Equipment Utilized During Treatment: Other (comment);Gait belt (Stedy) Activity Tolerance: Patient limited by fatigue Patient left: in bed;with call bell/phone within reach;with bed alarm set;with SCD's reapplied Nurse Communication: Mobility status;Other (comment) (Sacral wound) PT Visit Diagnosis: Muscle weakness (generalized) (M62.81);Unsteadiness on feet (R26.81);Other symptoms and signs involving the nervous system (R29.898)    Time: 2536-6440 PT Time Calculation (min) (ACUTE ONLY): 23 min   Charges:   PT Evaluation $PT Eval Moderate Complexity: 1 Mod PT Treatments $Therapeutic Activity: 8-22 mins        Jamesetta Geralds, PT, DPT [facility] Rehabilitation Department Office: [phone]  Jamesetta Geralds 12/02/2021, 11:24 AM

## 2021-12-02 NOTE — Progress Notes (Signed)
  Progress Note   [name] QIW:[id number] DOB: 08-12-1960 DOA: 11/29/2021     2 DOS: the patient was seen and examined on 12/02/2021 at 10:06AM      Brief hospital course: Charles [name] is an unfortunate 61 y.o. M with hx IDDM, CAD s/p CABG, orthostasis on midodrine, anemia and BPH now with chronic indwelling foley as well as progressive dementia, leg weakness and failure to thrive who presented with confusion.     9/22: Minimal mental imrpovement     Assessment and Plan: * Catheter-associated urinary tract infection (Charles [name]) BPH/BOO with chronic indwelling Foley catheter Foley exchanged in the ED. Urine growing Charles [name] and Charles [name], sensitive only to FQs and Zosyn - Continue Zosyn       Acute metabolic encephalopathy Acute change prior to admission, Radiology refused to perform MRI brain given unknown medical history.  CT head today unchanged.  Doubt stroke.  Patient has prominent spasticity, more on the right than the left.  Discussed with Neurology, this can be due to hyperglycemia.  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     At present, he seems to be improving slightly/gradually with antibiotics, fluids. - Continuent aibiotics and fluids   Insulin dependent type 2 diabetes mellitus (Charles [name]) Glucose los - Hold glargine - Continue SS corrections   Dementia (Charles [name]) Failure to thrive in adult Protein-calorie malnutrition, severe (Charles [name]) See prior summary  - Consult Palliative Care - Continue Glucerna - Consult dietitian - Liberalize diet    Chronic orthostatic hypotension - Hold midodrine  HLD (hyperlipidemia) - HOld rosuvastatin and Zetia   CAD S/p remote CABG - Continue aspirin - HOld rosuvastatin, Zetia.          Subjective: No fever, no vomiting, no respiratory distress.  Patient remains confused.     Physical  Exam: BP 120/69 (BP Location: Left Arm)   Pulse 70   Temp 98 F (36.7 C)   Resp 15   Wt 47.8 kg   SpO2 100%   BMI 16.49 kg/m   Cachectic elderly adult male, lying in bed, curled on his side, RRR, no murmurs, no peripheral edema Respiratory rate normal, lungs clear without rales or wheezes Abdomen soft without tenderness palpation or guarding, no ascites or distention Mostly does not comply with exam, seems extremely debilitated and weak, face symmetric, speech fluent although halting and with some psychomotor slowing that clear exam    Data Reviewed: CT head shows atrophy, otherwise unremarkable Basic metabolic panel shows resolved hypokalemia Hemogram shows mild anemia, slightly worse   Family communication: Discussed with sister by phone   Disposition: Status is: Inpatient         Author: Edwin Dada, MD 12/02/2021 3:29 PM  For on call review [email or web address].

## 2021-12-03 DIAGNOSIS — D638 Anemia in other chronic diseases classified elsewhere: Secondary | ICD-10-CM | POA: Diagnosis not present

## 2021-12-03 DIAGNOSIS — G9341 Metabolic encephalopathy: Secondary | ICD-10-CM | POA: Diagnosis not present

## 2021-12-03 DIAGNOSIS — N401 Enlarged prostate with lower urinary tract symptoms: Secondary | ICD-10-CM | POA: Diagnosis not present

## 2021-12-03 DIAGNOSIS — T83511A Infection and inflammatory reaction due to indwelling urethral catheter, initial encounter: Secondary | ICD-10-CM | POA: Diagnosis not present

## 2021-12-03 LAB — COMPREHENSIVE METABOLIC PANEL
ALT: 19 U/L (ref 0–44)
AST: 24 U/L (ref 15–41)
Albumin: 2.1 g/dL — ABNORMAL LOW (ref 3.5–5.0)
Alkaline Phosphatase: 90 U/L (ref 38–126)
Anion gap: 7 (ref 5–15)
BUN: 11 mg/dL (ref 6–20)
CO2: 22 mmol/L (ref 22–32)
Calcium: 8.1 mg/dL — ABNORMAL LOW (ref 8.9–10.3)
Chloride: 108 mmol/L (ref 98–111)
Creatinine, Ser: 0.73 mg/dL (ref 0.61–1.24)
GFR, Estimated: >60 mL/min (ref >60)
Glucose, Bld: 213 mg/dL — ABNORMAL HIGH (ref 70–99)
Potassium: 4.1 mmol/L (ref 3.5–5.1)
Sodium: 137 mmol/L (ref 135–145)
Total Bilirubin: 0.4 mg/dL (ref 0.3–1.2)
Total Protein: 6.1 g/dL — ABNORMAL LOW (ref 6.5–8.1)

## 2021-12-03 LAB — GLUCOSE, CAPILLARY
Glucose-Capillary: 196 mg/dL — ABNORMAL HIGH (ref 70–99)
Glucose-Capillary: 214 mg/dL — ABNORMAL HIGH (ref 70–99)
Glucose-Capillary: 160 mg/dL — ABNORMAL HIGH (ref 70–99)
Glucose-Capillary: 219 mg/dL — ABNORMAL HIGH (ref 70–99)

## 2021-12-03 LAB — CBC
HCT: 27.6 % — ABNORMAL LOW (ref 39.0–52.0)
Hemoglobin: 8.8 g/dL — ABNORMAL LOW (ref 13.0–17.0)
MCH: 26.9 pg (ref 26.0–34.0)
MCHC: 31.9 g/dL (ref 30.0–36.0)
MCV: 84.4 fL (ref 80.0–100.0)
Platelets: 307 10*3/uL (ref 150–400)
RBC: 3.27 MIL/uL — ABNORMAL LOW (ref 4.22–5.81)
RDW: 15.5 % (ref 11.5–15.5)
WBC: 9.2 10*3/uL (ref 4.0–10.5)
nRBC: 0 % (ref 0.0–0.2)

## 2021-12-03 LAB — MAGNESIUM: Magnesium: 1.5 mg/dL — ABNORMAL LOW (ref 1.7–2.4)

## 2021-12-03 MED ORDER — ADULT MULTIVITAMIN W/MINERALS CH
1.0000 | ORAL_TABLET | Freq: Every day | ORAL | Status: DC
  Administered 2021-12-03 – 2021-12-05 (×3): 1 via ORAL
  Filled 2021-12-03 (×3): qty 1

## 2021-12-03 NOTE — Progress Notes (Signed)
Initial Nutrition Assessment  DOCUMENTATION CODES:   Underweight  INTERVENTION:  Continue regular diet with feeding assist until pt is at baseline Glucerna Shake po TID, each supplement provides 220 kcal and 10 grams of protein MVI with minerals daily  NUTRITION DIAGNOSIS:   Inadequate oral intake related to decreased appetite as evidenced by percent weight loss.  GOAL:   Patient will meet greater than or equal to 90% of their needs  MONITOR:   PO intake, Supplement acceptance, Labs  REASON FOR ASSESSMENT:   Consult Assessment of nutrition requirement/status  ASSESSMENT:   Pt with hx of CAD, DM type 2, FTT, dementia and HTN presented to ED from SNF with AMS and a fever.  Pt found to have UTI and remains altered but improiving. Palliative care consulting and pt is now a DNR. Sister hopeful to be able to move pt to [place] in the coming weeks to be closer to her.   Reviewed chart, pt is underweight with a 17.7% weight loss noted x 6 months which is severe (05/2021-11/2021). Likely has malnutrition, will follow-up with physical exam to assess for fat and muscle deficits.   Intake seems to have improved after having diet liberalized yesterday to regular. Glucerna in place TID with good acceptance, will leave this for now as PO meal intake is improving.   Average Meal Intake: 9/20-9/22: 63% intake x 5 recorded meals  Nutritionally Relevant Medications: Scheduled Meds:  docusate sodium  100 mg Oral QHS   dronabinol  5 mg Oral BID AC   feeding supplement (GLUCERNA SHAKE)  237 mL Oral TID WC   insulin aspart  0-5 Units Subcutaneous QHS   insulin aspart  0-9 Units Subcutaneous TID WC   polyethylene glycol  17 g Oral Daily   thiamine  100 mg Oral Daily   Continuous Infusions:  0.9 % NaCl with KCl 20 mEq / L 75 mL/hr at 12/03/21 0406   piperacillin-tazobactam (ZOSYN)  IV 3.375 g (12/03/21 1322)    Labs Reviewed: Mg 1.5 CBG ranges from 54-243 mg/dL over the last 24  hours HgbA1c 10.4% (4/23)  NUTRITION - FOCUSED PHYSICAL EXAM: Defer to in-person assessment  Diet Order:   Diet Order             Diet regular Room service appropriate? Yes; Fluid consistency: Thin  Diet effective now                   EDUCATION NEEDS:   Not appropriate for education at this time  Skin:  Skin Assessment: Reviewed RN Assessment (stage 2 to the sacrum)  Last BM:  9/23 - type 6  Height:   Ht Readings from Last 1 Encounters:  12/03/21 5\' 7"  (1.702 m)    Weight:   Wt Readings from Last 1 Encounters:  12/03/21 46.1 kg    Ideal Body Weight:  67.3 kg  BMI:  Body mass index is 15.93 kg/m.  Estimated Nutritional Needs:  Kcal:  1600-1800 kcal/d Protein:  80-100g/d Fluid:  >/=1.8L/d    Ranell Patrick, RD, LDN Clinical Dietitian RD pager # available in [facility]  After hours/weekend pager # available in [facility]

## 2021-12-03 NOTE — Plan of Care (Signed)
  Problem: Education: Goal: Knowledge of General Education information will improve Description: Including pain rating scale, medication(s)/side effects and non-pharmacologic comfort measures Outcome: Not Progressing   Problem: Health Behavior/Discharge Planning: Goal: Ability to manage health-related needs will improve Outcome: Not Progressing   Problem: Clinical Measurements: Goal: Ability to maintain clinical measurements within normal limits will improve Outcome: Progressing Goal: Will remain free from infection Outcome: Progressing Goal: Diagnostic test results will improve Outcome: Progressing Goal: Respiratory complications will improve Outcome: Progressing Goal: Cardiovascular complication will be avoided Outcome: Progressing   Problem: Activity: Goal: Risk for activity intolerance will decrease Outcome: Not Progressing   Problem: Nutrition: Goal: Adequate nutrition will be maintained Outcome: Progressing   Problem: Coping: Goal: Level of anxiety will decrease Outcome: Progressing   Problem: Elimination: Goal: Will not experience complications related to bowel motility Outcome: Progressing Goal: Will not experience complications related to urinary retention Outcome: Not Progressing   Problem: Pain Managment: Goal: General experience of comfort will improve Outcome: Progressing   Problem: Safety: Goal: Ability to remain free from injury will improve Outcome: Progressing   Problem: Skin Integrity: Goal: Risk for impaired skin integrity will decrease Outcome: Progressing   Problem: Education: Goal: Ability to describe self-care measures that may prevent or decrease complications (Diabetes Survival Skills Education) will improve Outcome: Not Progressing Goal: Individualized Educational Video(s) Outcome: Not Progressing   Problem: Coping: Goal: Ability to adjust to condition or change in health will improve Outcome: Progressing   Problem: Fluid  Volume: Goal: Ability to maintain a balanced intake and output will improve Outcome: Progressing   Problem: Health Behavior/Discharge Planning: Goal: Ability to identify and utilize available resources and services will improve Outcome: Progressing Goal: Ability to manage health-related needs will improve Outcome: Progressing   Problem: Metabolic: Goal: Ability to maintain appropriate glucose levels will improve Outcome: Progressing   Problem: Nutritional: Goal: Maintenance of adequate nutrition will improve Outcome: Progressing Goal: Progress toward achieving an optimal weight will improve Outcome: Progressing   Problem: Skin Integrity: Goal: Risk for impaired skin integrity will decrease Outcome: Progressing   Problem: Tissue Perfusion: Goal: Adequacy of tissue perfusion will improve Outcome: Progressing

## 2021-12-03 NOTE — Progress Notes (Signed)
  Progress Note   [name] [id number]:[id number] DOB: [birth year] DOA: 11/29/2021     3 DOS: the patient was seen and examined on 12/03/2021 at at 9:25AM      Brief hospital course: Mr. Bowland is an unfortunate 61 y.o. M with hx IDDM, CAD s/p CABG, orthostasis on midodrine, anemia and BPH now with chronic indwelling foley as well as progressive dementia, leg weakness and failure to thrive who presented with confusion.         Assessment and Plan: * Catheter-associated urinary tract infection (HCC) BPH/BOO with chronic indwelling Foley catheter - Continue Zosyn day 3 of 7       Acute metabolic encephalopathy Seems somewhat improved. - Continue IV fluids and antibiotics   Insulin dependent type 2 diabetes mellitus (HCC) Glucose controlled mostly - Hold glargine - Continue SS corrections   Dementia (HCC) Failure to thrive in adult Protein-calorie malnutrition, severe ([place]) See prior summary  - Continue Glucerna - Consult dietitian    Chronic orthostatic hypotension BP low normal off midodrine  HLD (hyperlipidemia) - HOld rosuvastatin and Zetia   CAD S/p remote CABG - Continue aspirin - Hold rosuvastatin, Zetia.          Subjective: No further fever, no respiratory distress.  His mentation seems to be slowly improving.    Physical Exam: BP (!) 96/56 (BP Location: Right Arm)   Pulse 72   Temp 98.9 F (37.2 C) (Oral)   Resp 17   Wt 46.1 kg   SpO2 99%   BMI 15.93 kg/m   Cachectic adult male, sitting up in bed, feeding himself breakfast RRR, no murmurs, no peripheral edema Respiratory rate normal, lungs clear without rales or wheezes Abdomen with involuntary guarding, no ascites or distention, no tenderness Speech is fluent but he is confused and rambling about something that I do not understand, still disoriented, but today able to follow commands and eat lunch and seems more attentive to conversation     Data Reviewed: Complete  metabolic panel normal CBC shows anemia, no change   Family communication: will call sister by phone   Disposition: Status is: Inpatient         Author: Edwin Dada, MD 12/03/2021 11:31 AM  For on call review [email or web address].

## 2021-12-04 DIAGNOSIS — N401 Enlarged prostate with lower urinary tract symptoms: Secondary | ICD-10-CM | POA: Diagnosis not present

## 2021-12-04 DIAGNOSIS — R627 Adult failure to thrive: Secondary | ICD-10-CM | POA: Diagnosis not present

## 2021-12-04 DIAGNOSIS — G9341 Metabolic encephalopathy: Secondary | ICD-10-CM | POA: Diagnosis not present

## 2021-12-04 DIAGNOSIS — F0390 Unspecified dementia without behavioral disturbance: Secondary | ICD-10-CM | POA: Diagnosis not present

## 2021-12-04 DIAGNOSIS — D638 Anemia in other chronic diseases classified elsewhere: Secondary | ICD-10-CM | POA: Diagnosis not present

## 2021-12-04 DIAGNOSIS — E43 Unspecified severe protein-calorie malnutrition: Secondary | ICD-10-CM

## 2021-12-04 DIAGNOSIS — T83511A Infection and inflammatory reaction due to indwelling urethral catheter, initial encounter: Secondary | ICD-10-CM | POA: Diagnosis not present

## 2021-12-04 LAB — CBC
HCT: 29.6 % — ABNORMAL LOW (ref 39.0–52.0)
Hemoglobin: 9.4 g/dL — ABNORMAL LOW (ref 13.0–17.0)
MCH: 27 pg (ref 26.0–34.0)
MCHC: 31.8 g/dL (ref 30.0–36.0)
MCV: 85.1 fL (ref 80.0–100.0)
Platelets: 330 10*3/uL (ref 150–400)
RBC: 3.48 MIL/uL — ABNORMAL LOW (ref 4.22–5.81)
RDW: 15.4 % (ref 11.5–15.5)
WBC: 13.9 10*3/uL — ABNORMAL HIGH (ref 4.0–10.5)
nRBC: 0 % (ref 0.0–0.2)

## 2021-12-04 LAB — CULTURE, BLOOD (ROUTINE X 2)
Culture: NO GROWTH
Culture: NO GROWTH
Special Requests: ADEQUATE
Special Requests: ADEQUATE

## 2021-12-04 LAB — BASIC METABOLIC PANEL
Anion gap: 6 (ref 5–15)
BUN: 7 mg/dL (ref 6–20)
CO2: 23 mmol/L (ref 22–32)
Calcium: 7.9 mg/dL — ABNORMAL LOW (ref 8.9–10.3)
Chloride: 105 mmol/L (ref 98–111)
Creatinine, Ser: 0.54 mg/dL — ABNORMAL LOW (ref 0.61–1.24)
GFR, Estimated: >60 mL/min (ref >60)
Glucose, Bld: 209 mg/dL — ABNORMAL HIGH (ref 70–99)
Potassium: 3.8 mmol/L (ref 3.5–5.1)
Sodium: 134 mmol/L — ABNORMAL LOW (ref 135–145)

## 2021-12-04 LAB — GLUCOSE, CAPILLARY
Glucose-Capillary: 187 mg/dL — ABNORMAL HIGH (ref 70–99)
Glucose-Capillary: 226 mg/dL — ABNORMAL HIGH (ref 70–99)
Glucose-Capillary: 208 mg/dL — ABNORMAL HIGH (ref 70–99)
Glucose-Capillary: 158 mg/dL — ABNORMAL HIGH (ref 70–99)

## 2021-12-04 NOTE — Progress Notes (Signed)
Daily Progress Note   Patient Name: Charles [name]       Date: 12/04/2021 DOB: [birth year]  Age: 61 y.o. MRN#: [id number] Attending Physician: Edwin Dada, * Primary Care Physician: Darreld Mclean, MD Admit Date: 11/29/2021  Reason for Consultation/Follow-up: Establishing goals of care  Patient Profile/HPI:  61 y.o. male  with past medical history of neurologic decline related to complications of diabetes- has had extensive neuro workup, CAD s/p CABG, BOO, BPH with chronic foley admitted on 11/29/2021 from SNF with altered mental status, UTI. This is his second hospitalization this year for UTI. He was also hospitalized in June with GI bleed. Palliative consulted for [place].    Subjective: [name] is sitting up in his chair today. Lunch tray is untouched. He is focused on his tv control. He is able to tell me he feels "much better"- but doesn't respond when I ask him if he'd like help setting up his lunch tray. He didn't engage in further in depth conversation.  I fed him a bite of his lunch that he accepted, but he had very prolonged mastication and multiple swallows- I had to cue him to swallow. I handed him his fork, but he dropped it.  Chart review shows he has had 17% weight loss in 6 months.  I called his sister for further discussion. She continues to hope to transition him to be closer to her in [place]- I discussed with her my concerns regarding his ability to travel that distance, even by airplane- given his contractures, incontinent, and overall poor level of functioning- he required 2 person assist just for moving in the bed.   I discussed with her the concern that Samba is rapidly declining and I worry he may be limited in his time of life. [name] had not considered that Rangel may  die from his illness, she shared she had expected she would outlive him and had been concerned about how he would be cared for after she died.  I discussed advanced care planning decisions that may occur as his health deteriorates.  She does not think he would want a feeding tube- but she would want [place] to ask him about this. I shared with her that Mariah's cognition may not return to a place where he can comprehend and make these in depth decisions and  that would mean that she would need to be the decision maker.  For now- [place] wishes to continue all interventions and would want [place] rehospitalized if his condition worsened again.  I discussed outpatient Palliative services and she agrees to referral.   Review of Systems  Unable to perform ROS: Mental status change     Physical Exam Vitals and nursing note reviewed.  Constitutional:      Appearance: He is ill-appearing.     Comments: cachetic  Neurological:     Mental Status: He is alert. He is disoriented.             Vital Signs: BP 124/77 (BP Location: Right Arm)   Pulse 82   Temp 97.8 F (36.6 C)   Resp 17   Ht 5\' 7"  (1.702 m)   Wt 43.6 kg   SpO2 100%   BMI 15.05 kg/m  SpO2: SpO2: 100 % O2 Device: O2 Device: Room Air O2 Flow Rate:    Intake/output summary:  Intake/Output Summary (Last 24 hours) at 12/04/2021 1212 Last data filed at 12/04/2021 0900 Gross per 24 hour  Intake 477 ml  Output 2200 ml  Net -1723 ml   LBM: Last BM Date : 12/04/21 Baseline Weight: Weight: 45.6 kg Most recent weight: Weight: 43.6 kg       Palliative Assessment/Data: PPS: 30%      Patient Active Problem List   Diagnosis Date Noted   Dementia ([place]) 11/30/2021   Pressure injury of sacral region, stage 2 ([place]) 11/30/2021   Multiple fractures of ribs, left side, initial encounter for closed fracture 11/29/2021   Failure to thrive in adult 11/29/2021   Anemia of chronic disease 09/10/2021   Catheter-associated urinary tract infection  ([place]) 08/27/2021   Goals of care, counseling/discussion 08/26/2021   Delirium 08/23/2021   Confusion 08/23/2021   Unintentional weight loss of more than 10% body weight within 6 months 08/21/2021   Hemorrhoids 08/21/2021   Diverticulosis of colon 08/21/2021   Solitary rectal ulcer 08/21/2021   Polyp of ascending colon 08/21/2021   Hypokalemia 08/21/2021   Acute blood loss anemia 08/20/2021   Chronic orthostatic hypotension 08/20/2021   Protein-calorie malnutrition, severe ([place]) 08/20/2021   Hypomagnesemia 08/20/2021   Physical deconditioning 08/20/2021   Acute lower GI bleeding 08/19/2021   Pressure injury of skin 07/02/2021   Hypothermia 06/30/2021   Urinary retention 04/23/2021   Acute prerenal azotemia 04/22/2021   Leukocytosis 04/22/2021   HLD (hyperlipidemia) 16/60/6301   Acute metabolic encephalopathy 60/12/9321   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/23/2021   Insulin dependent type 2 diabetes mellitus ([place]) 03/23/2021   BPH (benign prostatic hyperplasia) 02/08/2021   Phimosis 01/27/2021   Hypertension 12/23/2020   CAD S/p remote CABG 12/23/2020    Palliative Care Assessment & Plan    Assessment/Recommendations/Plan  Continue current care Given his prolonged mastication- he may benefit from a softer diet- I placed order for dysphagia 3 diet to see if that will help improve his oral intake TOC referral for outpatient Palliative   Code Status: DNR  Prognosis:  Unable to determine  Discharge Planning: To Be Determined  Care plan was discussed with patient's sister and care team  Thank you for allowing the Palliative Medicine Team to assist in the care of this patient.  Total time:  70 minutes  Greater than 50%  of this time was spent counseling and coordinating care related to the above assessment and plan.  Mariana Kaufman, AGNP-C Palliative Medicine   Please contact Palliative  Medicine Team phone at [phone] for questions and concerns.

## 2021-12-04 NOTE — Progress Notes (Signed)
  Progress Note   [name] [id number]:[id number] DOB: [birth year] DOA: 11/29/2021     4 DOS: the patient was seen and examined on 12/04/2021 at 10:30AM      Brief hospital course: Charles [name] is an unfortunate 61 y.o. M with hx IDDM, CAD s/p CABG, orthostasis on midodrine, anemia and BPH now with chronic indwelling foley as well as progressive dementia, leg weakness and failure to thrive who presented with confusion.         Assessment and Plan: * Catheter-associated urinary tract infection ([name]) BPH/BOO with chronic indwelling Foley catheter - Continue Zosyn day 4 of 7       Acute metabolic encephalopathy Still gradually improving - Continue IV fluids and antibiotics   Insulin dependent type 2 diabetes mellitus (HCC) Glucose labile but mostly controlled - Hold home glargine - Continue sliding scale corrections  Dementia (HCC) Failure to thrive in adult Protein-calorie malnutrition, severe (HCC) -Consult palliative care    Chronic orthostatic hypotension BP low normal off midodrine  HLD (hyperlipidemia) - Hold Crestor and Zetia   CAD S/p remote CABG - Continue aspirin - Hold rosuvastatin, Zetia.          Subjective: No fever, respiratory distress, vomiting.  Mentation function slightly better, but still disoriented.    Physical Exam: BP 133/80 (BP Location: Right Arm)   Pulse (!) 103   Temp 97.6 F (36.4 C) (Oral)   Resp 18   Ht 5\' 7"  (1.702 m)   Wt 43.6 kg   SpO2 100%   BMI 15.05 kg/m   Cachectic adult male, sitting up in recliner, watching infomerciaols on Television, does not seem nobody is watching Tachycardic, regular, no murmurs, no peripheral edema Respiratory rate normal, lungs clear without rales or wheezes Abdomen soft without tenderness palpation or guarding, no ascites or distention Makes eye contact, responds to questions, oriented to "hospital", "[place]", but otherwise t loses track of our conversation,  remembers    Data Reviewed: White blood cell count up to 13, hemoglobin 9.4, no change Sodium 135, insignificant from a clinical standpoint, creatinine stable   Family communication: will call sister by phone   Disposition: Status is: Inpatient         Author: Edwin Dada, MD 12/04/2021 3:49 PM  For on call review [email or web address].

## 2021-12-04 NOTE — Plan of Care (Signed)
  Problem: Education: Goal: Knowledge of General Education information will improve Description: Including pain rating scale, medication(s)/side effects and non-pharmacologic comfort measures Outcome: Not Progressing   Problem: Health Behavior/Discharge Planning: Goal: Ability to manage health-related needs will improve Outcome: Progressing   Problem: Clinical Measurements: Goal: Ability to maintain clinical measurements within normal limits will improve Outcome: Progressing Goal: Will remain free from infection Outcome: Progressing Goal: Diagnostic test results will improve Outcome: Progressing Goal: Respiratory complications will improve Outcome: Progressing Goal: Cardiovascular complication will be avoided Outcome: Progressing   Problem: Activity: Goal: Risk for activity intolerance will decrease Outcome: Not Progressing   Problem: Nutrition: Goal: Adequate nutrition will be maintained Outcome: Progressing   Problem: Coping: Goal: Level of anxiety will decrease Outcome: Progressing   Problem: Elimination: Goal: Will not experience complications related to bowel motility Outcome: Progressing Goal: Will not experience complications related to urinary retention Outcome: Progressing   Problem: Pain Managment: Goal: General experience of comfort will improve Outcome: Progressing   Problem: Safety: Goal: Ability to remain free from injury will improve Outcome: Progressing   Problem: Skin Integrity: Goal: Risk for impaired skin integrity will decrease Outcome: Progressing   Problem: Education: Goal: Ability to describe self-care measures that may prevent or decrease complications (Diabetes Survival Skills Education) will improve Outcome: Progressing Goal: Individualized Educational Video(s) Outcome: Progressing   Problem: Coping: Goal: Ability to adjust to condition or change in health will improve Outcome: Progressing   Problem: Fluid Volume: Goal: Ability  to maintain a balanced intake and output will improve Outcome: Progressing   Problem: Health Behavior/Discharge Planning: Goal: Ability to identify and utilize available resources and services will improve Outcome: Progressing Goal: Ability to manage health-related needs will improve Outcome: Progressing   Problem: Metabolic: Goal: Ability to maintain appropriate glucose levels will improve Outcome: Progressing   Problem: Nutritional: Goal: Maintenance of adequate nutrition will improve Outcome: Progressing Goal: Progress toward achieving an optimal weight will improve Outcome: Progressing   Problem: Skin Integrity: Goal: Risk for impaired skin integrity will decrease Outcome: Progressing   Problem: Tissue Perfusion: Goal: Adequacy of tissue perfusion will improve Outcome: Progressing

## 2021-12-05 DIAGNOSIS — N390 Urinary tract infection, site not specified: Secondary | ICD-10-CM | POA: Diagnosis not present

## 2021-12-05 DIAGNOSIS — T83511A Infection and inflammatory reaction due to indwelling urethral catheter, initial encounter: Secondary | ICD-10-CM | POA: Diagnosis not present

## 2021-12-05 LAB — GLUCOSE, CAPILLARY
Glucose-Capillary: 184 mg/dL — ABNORMAL HIGH (ref 70–99)
Glucose-Capillary: 223 mg/dL — ABNORMAL HIGH (ref 70–99)

## 2021-12-05 LAB — COMPREHENSIVE METABOLIC PANEL
ALT: 16 U/L (ref 0–44)
AST: 14 U/L — ABNORMAL LOW (ref 15–41)
Albumin: 2 g/dL — ABNORMAL LOW (ref 3.5–5.0)
Alkaline Phosphatase: 79 U/L (ref 38–126)
Anion gap: 8 (ref 5–15)
BUN: 8 mg/dL (ref 6–20)
CO2: 23 mmol/L (ref 22–32)
Calcium: 8.3 mg/dL — ABNORMAL LOW (ref 8.9–10.3)
Chloride: 107 mmol/L (ref 98–111)
Creatinine, Ser: 0.64 mg/dL (ref 0.61–1.24)
GFR, Estimated: >60 mL/min (ref >60)
Glucose, Bld: 148 mg/dL — ABNORMAL HIGH (ref 70–99)
Potassium: 3.6 mmol/L (ref 3.5–5.1)
Sodium: 138 mmol/L (ref 135–145)
Total Bilirubin: 0.4 mg/dL (ref 0.3–1.2)
Total Protein: 5.9 g/dL — ABNORMAL LOW (ref 6.5–8.1)

## 2021-12-05 LAB — CBC
HCT: 26.8 % — ABNORMAL LOW (ref 39.0–52.0)
Hemoglobin: 8.5 g/dL — ABNORMAL LOW (ref 13.0–17.0)
MCH: 27 pg (ref 26.0–34.0)
MCHC: 31.7 g/dL (ref 30.0–36.0)
MCV: 85.1 fL (ref 80.0–100.0)
Platelets: 340 10*3/uL (ref 150–400)
RBC: 3.15 MIL/uL — ABNORMAL LOW (ref 4.22–5.81)
RDW: 15.7 % — ABNORMAL HIGH (ref 11.5–15.5)
WBC: 11.5 10*3/uL — ABNORMAL HIGH (ref 4.0–10.5)
nRBC: 0 % (ref 0.0–0.2)

## 2021-12-05 MED ORDER — LEVOFLOXACIN 750 MG PO TABS: 750.0000 mg | ORAL_TABLET | Freq: Every day | ORAL | 0 refills | Status: AC

## 2021-12-05 MED ORDER — LEVOFLOXACIN 750 MG PO TABS: 750.0000 mg | ORAL_TABLET | Freq: Every day | ORAL | 0 refills | Status: DC

## 2021-12-05 MED ORDER — LOPERAMIDE HCL 2 MG PO CAPS: 2.0000 mg | ORAL_CAPSULE | ORAL | 0 refills | Status: AC | PRN

## 2021-12-05 NOTE — Progress Notes (Signed)
[id number]) Hospital liaison note  Notified by [facility] Evette Cristal, LCSW, of patient/family request for [facility] Palliative services at home after discharge.  [facility] hospital liaison will follow patient for discharge disposition.  Please call with any hospice or outpatient palliative care related questions.   Thank you for the opportunity to participate in this patient's care.  [place]  [facility] liaison  [phone]

## 2021-12-05 NOTE — Progress Notes (Signed)
Charles [name] continues to have chronic diarrhea, Dr. Loleta Books aware. Patient sat up for a couple hours today, tolerated well. Foley care done. Foam dressing changed for Stage 2 on sacrum. Charles [name] swallows pills with water.He needs assistance feeding himself. Report called to [facility], report given to [facility]. Charles [name] was informed that Charles [name] had not eaten lunch and his CBG was-223, I did not give him insulin coverage because he hadn't eaten. [facility] transported patient to [facility].

## 2021-12-05 NOTE — Discharge Summary (Signed)
Physician Discharge Summary   [name] MRN: [id number] DOB: 08-28-1960  Admit date:     11/29/2021  Discharge date: 12/05/21  Discharge Physician: Edwin Dada   PCP: Darreld Mclean, MD     Recommendations at discharge:  Recommend patient to be followed by Palliative Care at SNF within 10 days Take Levaquin nightly for 6 more days then stop Glargine held at d/c due to hypoglycemia; please resume Glargine if persistently hyperglycemic Please schedule for urodynamics with Urology     Discharge Diagnoses: Principal Problem:   Acute metabolic encephalopathy due to catheter-associated urinary tract infection Active Problems:   Protein-calorie malnutrition, severe (HCC)   Failure to thrive in adult   Insulin dependent type 2 diabetes mellitus ([place])   CAD S/p remote CABG   BPH (benign prostatic hyperplasia)   HLD (hyperlipidemia)   Chronic orthostatic hypotension   Hypokalemia   Anemia of chronic disease   Multiple fractures of ribs, left side, initial encounter for closed fracture   Dementia ([place])   Pressure injury of sacral region, stage 2 [facility])         Hospital Course: Mr. Offerdahl is an unfortunate 61 y.o. M with hx IDDM, CAD s/p CABG, orthostasis on midodrine, anemia and BPH now with chronic indwelling foley as well as progressive dementia, leg weakness and failure to thrive who presented with confusion.        * Acute metabolic encephalopathy due to catheter-associated urinary tract infection  BPH/BOO with chronic indwelling Foley catheter At baseline, patient is interactive and appropriate.  On admission, he was somnolent and disoriented, could not answer questions appropriately.     Foley exchanged in the ED and started on antibiotics.  Blood cultures negative, urine growing morganella and Ecoli, broadened to Zosyn for Morganella.   Extensive work up for identical presentation in April were notable for normal brain MRI, EEG, TSH, RPR,  B12, HIV, folate, and acute hepatitis panel.  MRI brain this admission normal.  Completed 4 days Zosyn in the hospital and mentation returned to baseline.  Taking orals well, afebrile.  Discharged with 6 more days Levaquin    Insulin dependent type 2 diabetes mellitus (HCC) Glucose labile.  Hypoglycemic with glargine, so this was held.       Dementia ([place]) Severe protein calorie malnutrition  Failure to thrive in adult Patient has had "precipitous decline" in last 9 months, both physically and mentally.  It seems that within the last year even, he was still working.    He has been evaluated by Neurology (Dr. Leta Baptist several times in the office and also in consultation in the hospital) and I am surprised no diagnosis of neurodegenerative disease was made, because it certainly seems like the time course and severity of it are more than the nominal working diagnosis of "diabetes related complications" which is what all of his symptoms have been attributed to, and which is certainly possible.  Regardless, extensive work up for reversible/correctable causes of dementia and weakness and weight loss by hospitalists/Neurology/GI have all been negative.   Since May, has lost >20% of body weight and is now with severe diffuse loss of subcutaneous muscle mass and fat.     Appears Hospice appropriate.  Sister [name] is involved in his care and was hoping to arrange for him to move to [facility] in 2 weeks, to be close to her, although these plans currently may be on hold.     Multiple fractures of ribs, left side, initial  encounter for closed fracture X-ray shows recent fractures in the posterior lateral aspects of left fourth and fifth ribs.  No pneumothorax.  Anemia of chronic disease Hemoglobin stable   BPH (benign prostatic hyperplasia) Foley exchanged in ER.  He has followed with Urology and failed at least two in office voiding trials, but lately not been healthy enough to complete  urodynamic studies to figure out definitive management of his bladder outlet obstruction.              The [facility] Controlled Substances Registry was reviewed for this patient prior to discharge.     Disposition: Skilled nursing facility   DISCHARGE MEDICATION: Allergies as of 12/05/2021   No Known Allergies      Medication List     STOP taking these medications    insulin glargine-yfgn 100 UNIT/ML injection Commonly known as: SEMGLEE   Levemir FlexPen 100 UNIT/ML FlexPen Generic drug: insulin detemir       TAKE these medications    aspirin EC 81 MG tablet Take 1 tablet (81 mg total) by mouth daily. Swallow whole.   blood glucose meter kit and supplies Dispense based on patient and insurance preference. Use up to four times daily as directed. (FOR ICD-10 E10.9, E11.9).   docusate sodium 100 MG capsule Commonly known as: COLACE Take 1 capsule (100 mg total) by mouth at bedtime.   dronabinol 5 MG capsule Commonly known as: MARINOL Take 5 mg by mouth 2 (two) times daily before a meal.   ezetimibe 10 MG tablet Commonly known as: ZETIA Take 1 tablet (10 mg total) by mouth daily.   Glucerna Liqd Take 237 mLs by mouth 3 (three) times daily with meals. What changed: Another medication with the same name was removed. Continue taking this medication, and follow the directions you see here.   hydrocortisone 2.5 % rectal cream Commonly known as: ANUSOL-HC Place rectally 2 (two) times daily.   insulin lispro 100 UNIT/ML KwikPen Commonly known as: HUMALOG Inject 2-10 Units into the skin See admin instructions. Inject 2-10 units into the skin daily with meals, PER SLIDING SCALE: BGL 140-199 = 2 units; 200-250 = 4 units; 251-299 = 6 units; 300-349 = 8 units; 350+ = 10 units; <70 or >350, CALL MD   levofloxacin 750 MG tablet Commonly known as: Levaquin Take 1 tablet (750 mg total) by mouth at bedtime for 6 days.   loperamide 2 MG capsule Commonly known  as: IMODIUM Take 1 capsule (2 mg total) by mouth as needed for diarrhea or loose stools.   methenamine 1 g tablet Commonly known as: HIPREX Take 1 g by mouth 2 (two) times daily with a meal.   midodrine 10 MG tablet Commonly known as: PROAMATINE Take 1 tablet (10 mg total) by mouth 3 (three) times daily with meals.   multivitamin with minerals Tabs tablet Take 1 tablet by mouth daily.   NON FORMULARY Take 118 mLs by mouth See admin instructions. Magic Cup desert cup- Eat 1 "cup" (118 ml's) by mouth 2 times a day- with lunch and supper   pantoprazole 40 MG tablet Commonly known as: PROTONIX Take 1 tablet (40 mg total) by mouth daily. Any generic PPI is okay to dispense What changed:  when to take this additional instructions   polyethylene glycol 17 g packet Commonly known as: MIRALAX / GLYCOLAX Take 17 g by mouth daily.   Preparation H Rapid Relief 5-0.25-14.4-15 % Crea Place 1 Application rectally every 6 (six) hours as  needed (hemorrhoids).   rosuvastatin 40 MG tablet Commonly known as: CRESTOR Take 1 tablet (40 mg total) by mouth daily.   thiamine 100 MG tablet Commonly known as: VITAMIN B1 Take 1 tablet (100 mg total) by mouth daily.               Discharge Care Instructions  (From admission, onward)           Start     Ordered   12/05/21 0000  Discharge wound care:       Comments: Cleanse with saline Cover with silicone foam  Change every other day or for soiling   12/05/21 1023             Discharge Instructions     Discharge wound care:   Complete by: As directed    Cleanse with saline Cover with silicone foam  Change every other day or for soiling   Increase activity slowly   Complete by: As directed        Discharge Exam: Filed Weights   12/03/21 0547 12/04/21 0500 12/05/21 0518  Weight: 46.1 kg 43.6 kg 45.1 kg    General: Pt is alert, awake, not in acute distress, cachectic, watching television, searching for  golf Cardiovascular: RRR, nl S1-S2, no murmurs appreciated.   No LE edema.   Respiratory: Normal respiratory rate and rhythm.  CTAB without rales or wheezes. Abdominal: Abdomen soft and non-tender.  No distension or HSM.   Neuro/Psych: Strength symmetrically extremely weak in upper and lower extremities, contractures in all four extremities.  Judgment and insight appear normal.   Condition at discharge: stable  The results of significant diagnostics from this hospitalization (including imaging, microbiology, ancillary and laboratory) are listed below for reference.   Imaging Studies: CT HEAD WO CONTRAST (5MM)  Result Date: 12/02/2021 CLINICAL DATA:  Neuro deficit, acute, stroke suspected EXAM: CT HEAD WITHOUT CONTRAST TECHNIQUE: Contiguous axial images were obtained from the base of the skull through the vertex without intravenous contrast. RADIATION DOSE REDUCTION: This exam was performed according to the departmental dose-optimization program which includes automated exposure control, adjustment of the mA and/or kV according to patient size and/or use of iterative reconstruction technique. COMPARISON:  MRI head August 22, 2021. CT head July 02, 2021. FINDINGS: Brain: No evidence of acute large vascular territory infarction, hemorrhage, hydrocephalus, extra-axial collection or intraparenchymal mass lesion/mass effect. Similar cerebral atrophy with ex vacuo ventricular dilation. Similar patchy white matter hypodensities, nonspecific but compatible with chronic microvascular ischemic disease. Similar remote lacunar infarcts in the right basal ganglia. 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 Vascular: No hyperdense vessel identified. Calcific intracranial atherosclerosis. Skull: No acute fracture. Sinuses/Orbits: Right maxillary sinus mucosal thickening. No acute orbital findings. Other: No mastoid effusions. IMPRESSION: 1. No evidence of acute intracranial abnormality. 2. 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 3. Remote right basal ganglia lacunar infarcts and chronic microvascular ischemic disease. 4. Similar cerebral atrophy (ICD10-G31.9) and ex vacuo ventricular dilation. Electronically Signed   By: Margaretha Sheffield M.D.   On: 12/02/2021 11:14   DG Chest Portable 1 View  Result Date: 11/29/2021 CLINICAL DATA:  Altered mental status EXAM: PORTABLE CHEST 1 VIEW COMPARISON:  07/02/2021 FINDINGS: The heart size and mediastinal contours are within normal limits. Both lungs are clear. There is previous coronary bypass surgery. Recent fractures are seen in the posterolateral aspects of left fourth and fifth ribs. There is minimal offset in alignment of fracture fragments in  the fourth rib. IMPRESSION: There are no signs of pulmonary edema or focal pulmonary consolidation. Recent fractures are seen in the posterolateral aspects of left fourth and fifth ribs. Electronically Signed   By: Elmer Picker M.D.   On: 11/29/2021 18:30    Microbiology: Results for orders placed or performed during the hospital encounter of 11/29/21  Culture, blood (Routine x 2)     Status: None   Collection Time: 11/29/21  5:37 PM   Specimen: BLOOD  Result Value Ref Range Status   Specimen Description   Final    BLOOD BLOOD RIGHT FOREARM Performed at [facility], [place] [address], [place], [place] [address]    Special Requests   Final    BOTTLES DRAWN AEROBIC AND ANAEROBIC Blood Culture adequate volume Performed at [place] [address] [place], [place] [address]    Culture   Final    NO GROWTH 5 DAYS Performed at [facility], [place] [address], [place], [place] [address]    Report Status 12/04/2021 FINAL  Final  Culture, blood (Routine x 2)     Status: None   Collection Time: 11/29/21  5:42 PM   Specimen: BLOOD  Result Value Ref Range Status   Specimen Description   Final    BLOOD LEFT ANTECUBITAL Performed at [place] [address], [place], [place] [address]    Special Requests   Final    BOTTLES DRAWN AEROBIC AND ANAEROBIC Blood Culture adequate volume Performed at [place] [address], [place], [place] [address]    Culture   Final    NO GROWTH 5 DAYS Performed at [facility], [place] [address], [place], [place] [address]    Report Status 12/04/2021 FINAL  Final  Urine Culture     Status: Abnormal   Collection Time: 11/29/21  6:49 PM   Specimen: Urine, Catheterized  Result Value Ref Range Status   Specimen Description   Final    URINE, CATHETERIZED Performed at [place] [address], [place], [place] [address]    Special Requests   Final    NONE Performed at [facility], [place] [address] [place], [place] [address]    Culture (A)  Final    >=100,000 COLONIES/mL ESCHERICHIA COLI >=100,000 COLONIES/mL MORGANELLA MORGANII    Report Status 12/01/2021 FINAL  Final   Organism ID, Bacteria ESCHERICHIA COLI (A)  Final   Organism ID, Bacteria MORGANELLA MORGANII (A)  Final      Susceptibility   Escherichia coli - MIC*    AMPICILLIN >=32 RESISTANT Resistant     CEFAZOLIN >=64 RESISTANT Resistant     CEFEPIME <=0.12 SENSITIVE Sensitive     CEFTRIAXONE <=0.25 SENSITIVE Sensitive     CIPROFLOXACIN <=0.25 SENSITIVE Sensitive     GENTAMICIN >=16 RESISTANT Resistant     IMIPENEM <=0.25 SENSITIVE Sensitive     NITROFURANTOIN <=16 SENSITIVE Sensitive     TRIMETH/SULFA >=320 RESISTANT Resistant     AMPICILLIN/SULBACTAM 16 INTERMEDIATE Intermediate     PIP/TAZO <=4 SENSITIVE Sensitive     * >=100,000 COLONIES/mL ESCHERICHIA COLI   Morganella morganii - MIC*    AMPICILLIN >=32 RESISTANT Resistant     CEFAZOLIN >=64 RESISTANT Resistant     CIPROFLOXACIN <=0.25 SENSITIVE Sensitive     GENTAMICIN <=1 SENSITIVE Sensitive     IMIPENEM 2 SENSITIVE Sensitive     NITROFURANTOIN 128 RESISTANT Resistant  TRIMETH/SULFA <=20 SENSITIVE Sensitive     AMPICILLIN/SULBACTAM 8 SENSITIVE Sensitive     PIP/TAZO <=4 SENSITIVE Sensitive     * >=100,000 COLONIES/mL MORGANELLA MORGANII  Resp Panel by RT-PCR (Flu A&B, Covid) Anterior Nasal Swab     Status: None   Collection Time: 11/29/21  6:50 PM   Specimen: Anterior Nasal Swab  Result Value Ref Range Status   SARS Coronavirus 2 by RT PCR NEGATIVE NEGATIVE Final    Comment: (NOTE) SARS-CoV-2 target nucleic acids are NOT DETECTED.  The SARS-CoV-2 RNA is generally detectable in upper respiratory specimens during the acute phase of infection. The lowest concentration of SARS-CoV-2 viral copies this assay can detect is 138 copies/mL. A negative result does not preclude SARS-Cov-2 infection and should not be used as the sole basis for treatment or other patient management decisions. A negative result may occur with  improper specimen collection/handling, submission of specimen other than nasopharyngeal swab, presence of viral mutation(s) within the areas targeted by this assay, and inadequate number of viral copies(<138 copies/mL). A negative result must be combined with clinical observations, patient history, and epidemiological information. The expected result is Negative.  Fact Sheet for Patients:  EntrepreneurPulse.com.au  Fact Sheet for Healthcare Providers:  IncredibleEmployment.be  This test is no t yet approved or cleared by the [place] FDA and  has been authorized for detection and/or diagnosis of SARS-CoV-2 by FDA under an Emergency Use Authorization (EUA). This EUA will remain  in effect (meaning this test can be used) for the duration of the COVID-19 declaration under Section 564(b)(1) of the Act, 21 U.S.C.section 360bbb-3(b)(1), unless the authorization is terminated  or revoked sooner.       Influenza A by PCR NEGATIVE NEGATIVE Final   Influenza B by PCR NEGATIVE NEGATIVE Final     Comment: (NOTE) The Xpert Xpress SARS-CoV-2/FLU/RSV plus assay is intended as an aid in the diagnosis of influenza from Nasopharyngeal swab specimens and should not be used as a sole basis for treatment. Nasal washings and aspirates are unacceptable for Xpert Xpress SARS-CoV-2/FLU/RSV testing.  Fact Sheet for Patients: EntrepreneurPulse.com.au  Fact Sheet for Healthcare Providers: IncredibleEmployment.be  This test is not yet approved or cleared by the [place] FDA and has been authorized for detection and/or diagnosis of SARS-CoV-2 by FDA under an Emergency Use Authorization (EUA). This EUA will remain in effect (meaning this test can be used) for the duration of the COVID-19 declaration under Section 564(b)(1) of the Act, 21 U.S.C. section 360bbb-3(b)(1), unless the authorization is terminated or revoked.  Performed at [facility], [place] [address] [place], [place] [address]     Labs: CBC: Recent Labs  Lab 11/29/21 1737 11/30/21 0617 12/01/21 0558 12/02/21 0551 12/03/21 0623 12/04/21 0545 12/05/21 0542  WBC 8.6   < > 8.9 9.3 9.2 13.9* 11.5*  NEUTROABS 5.9  --   --   --   --   --   --   HGB 9.6*   < > 9.3* 8.8* 8.8* 9.4* 8.5*  HCT 29.7*   < > 29.3* 27.6* 27.6* 29.6* 26.8*  MCV 82.7   < > 84.2 84.9 84.4 85.1 85.1  PLT 375   < > 329 322 307 330 340   < > = values in this interval not displayed.   Basic Metabolic Panel: Recent Labs  Lab 12/01/21 0558 12/02/21 0551 12/03/21 0623 12/04/21 0545 12/05/21 0542  NA 139 135 137 134* 138  K 3.2* 3.6 4.1 3.8 3.6  CL 108 107 108 105 107  CO2 $Re'25 23 22 23 23  'nvT$ GLUCOSE 146* 56* 213* 209* 148*  BUN $Re'12 9 11 7 8  'Hcr$ CREATININE 0.68 0.55* 0.73 0.54* 0.64  CALCIUM 8.4* 8.2* 8.1* 7.9* 8.3*  MG  --   --  1.5*  --   --    Liver Function Tests: Recent Labs  Lab 11/29/21 1737 12/01/21 0558 12/03/21 0623 12/05/21 0542  AST $Re'24 23 24 'LFb$ 14*  ALT $Re'21 17 19 16  'Iqn$ ALKPHOS 103  100 90 79  BILITOT 0.4 0.4 0.4 0.4  PROT 6.5 6.1* 6.1* 5.9*  ALBUMIN 2.5* 2.2* 2.1* 2.0*   CBG: Recent Labs  Lab 12/04/21 0746 12/04/21 1121 12/04/21 1602 12/04/21 2123 12/05/21 0801  GLUCAP 187* 226* 208* 158* 184*    Discharge time spent: approximately 35 minutes spent on discharge counseling, evaluation of patient on day of discharge, and coordination of discharge planning with nursing, social work, pharmacy and case management  Signed: Edwin Dada, MD [facility] 12/05/2021

## 2021-12-05 NOTE — Progress Notes (Addendum)
Physical Therapy Treatment Patient Details Name: Charles [name] MRN: [id number] DOB: 07/28/1960 Today's Date: 12/05/2021   History of Present Illness Pt is a57yo male presenting on 9/19 from SNF with confusion and AMS. CXR revealed recent L 4th & 5th rib fxs.   PMH: DM, HTN, CAD s/p CABG, chronic indwelling foley, progressive dementia, leg weakness, failure to thrive.    PT Comments    General Comments: AxO x 3 aware of his current situtation.  Pt stated he was born in [place], was married but now divorced with NO children.  States he has a sister but she lives in [place]. Assisted OOB was difficult.  General bed mobility comments: Total Assist + 1 present with stiffness and "Fetal Positioning" even in sitting EOB. General transfer comment: Total Asisst + 2 from elevated bed to [facility] with poor ability to support his weight and poor flexed hips/knees.  Pt incont of bowel.  + 2 asisst for hygiene. Positioned in recliner with multiple pillows for comfort and pressure relief.   Pt will need to return to his SNF.   Recommendations for follow up therapy are one component of a multi-disciplinary discharge planning process, led by the attending physician.  Recommendations may be updated based on patient status, additional functional criteria and insurance authorization.  Follow Up Recommendations  Skilled nursing-short term rehab (<3 hours/day) Can patient physically be transported by private vehicle: No   Assistance Recommended at Discharge Frequent or constant Supervision/Assistance  Patient can return home with the following Two people to help with walking and/or transfers;Two people to help with bathing/dressing/bathroom;Assistance with cooking/housework;Assistance with feeding;Direct supervision/assist for medications management;Direct supervision/assist for financial management;Assist for transportation;Help with stairs or ramp for entrance   Equipment Recommendations  None recommended by PT     Recommendations for Other Services       Precautions / Restrictions       Mobility  Bed Mobility Overal bed mobility: Needs Assistance Bed Mobility: Supine to Sit     Supine to sit: Total assist     General bed mobility comments: Total Assist + 1 present with stiffness and "Fetal Positioning" even in sitting EOB.    Transfers Overall transfer level: Needs assistance Equipment used: Ambulation equipment used Transfers: Sit to/from Stand             General transfer comment: Total Asisst + 2 from elevated bed to [facility] with poor ability to support his weight and poor flexed hips/knees.  Pt incont of bowel.  + 2 asisst for hygiene.    Ambulation/Gait               General Gait Details: non amb   Stairs             Wheelchair Mobility    Modified Rankin (Stroke Patients Only)       Balance                                            Cognition Arousal/Alertness: Awake/alert                                     General Comments: AxO x 3 aware of his current situtation.  Pt stated he was born in [place], was married but now divorced with NO choidren.  States he has a  sister but she lives in [place].        Exercises      General Comments        Pertinent Vitals/Pain      Home Living                          Prior Function            PT Goals (current goals can now be found in the care plan section) Progress towards PT goals: Progressing toward goals    Frequency    Min 3X/week      PT Plan Current plan remains appropriate    Co-evaluation              AM-PAC PT "6 Clicks" Mobility   Outcome Measure  Help needed turning from your back to your side while in a flat bed without using bedrails?: Total Help needed moving from lying on your back to sitting on the side of a flat bed without using bedrails?: Total Help needed moving to and from a bed to a chair (including a  wheelchair)?: Total Help needed standing up from a chair using your arms (e.g., wheelchair or bedside chair)?: Total Help needed to walk in hospital room?: Total Help needed climbing 3-5 steps with a railing? : Total 6 Click Score: 6    End of Session Equipment Utilized During Treatment: Gait belt Activity Tolerance: Patient limited by fatigue Patient left: with call bell/phone within reach;with bed alarm set;with SCD's reapplied Nurse Communication: Mobility status;Other (comment) PT Visit Diagnosis: Muscle weakness (generalized) (M62.81);Unsteadiness on feet (R26.81);Other symptoms and signs involving the nervous system (R29.898)     Time: 2423-5361 PT Time Calculation (min) (ACUTE ONLY): 25 min  Charges:  $Therapeutic Activity: 23-37 mins    Rica Koyanagi  PTA Acute  Rehabilitation Services Office M-F          [phone] Weekend pager [phone]

## 2021-12-05 NOTE — NC FL2 (Signed)
[place] LEVEL OF CARE SCREENING TOOL     IDENTIFICATION  Patient Name: Charles [name] Birthdate: 10-04-1960 Sex: male Admission Date (Current Location): 11/29/2021  [facility] and [place] Number:  Herbalist and Address:  [facility],  [address], [place]      Provider Number: [id number]  Attending Physician Name and Address:  Edwin Dada, *  Relative Name and Phone Number:  Lorrin Goodell   [place]   [phone]  [place] Other   [phone]    Current Level of Care: Hospital Recommended Level of Care: [place] Prior Approval Number:    Date Approved/Denied:   PASRR Number: [id number]  Discharge Plan: SNF    Current Diagnoses: Patient Active Problem List   Diagnosis Date Noted   Dementia ([place]) 11/30/2021   Pressure injury of sacral region, stage 2 ([place]) 11/30/2021   Multiple fractures of ribs, left side, initial encounter for closed fracture 11/29/2021   Failure to thrive in adult 11/29/2021   Anemia of chronic disease 09/10/2021   Catheter-associated urinary tract infection ([place]) 08/27/2021   Goals of care, counseling/discussion 08/26/2021   Delirium 08/23/2021   Confusion 08/23/2021   Unintentional weight loss of more than 10% body weight within 6 months 08/21/2021   Hemorrhoids 08/21/2021   Diverticulosis of colon 08/21/2021   Solitary rectal ulcer 08/21/2021   Polyp of ascending colon 08/21/2021   Hypokalemia 08/21/2021   Acute blood loss anemia 08/20/2021   Chronic orthostatic hypotension 08/20/2021   Protein-calorie malnutrition, severe ([place]) 08/20/2021   Hypomagnesemia 08/20/2021   Physical deconditioning 08/20/2021   Acute lower GI bleeding 08/19/2021   Pressure injury of skin 07/02/2021   Hypothermia 06/30/2021   Urinary retention 04/23/2021   Acute prerenal azotemia 04/22/2021   Leukocytosis 04/22/2021   HLD (hyperlipidemia)  0000000   Acute metabolic encephalopathy 0000000   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/23/2021   Insulin dependent type 2 diabetes mellitus ([place]) 03/23/2021   BPH (benign prostatic hyperplasia) 02/08/2021   Phimosis 01/27/2021   Hypertension 12/23/2020   CAD S/p remote CABG 12/23/2020    Orientation RESPIRATION BLADDER Height & Weight     Self, Place  Normal Incontinent Weight: 99 lb 8 oz (45.1 kg) Height:  5\' 7"  (170.2 cm)  BEHAVIORAL SYMPTOMS/MOOD NEUROLOGICAL BOWEL NUTRITION STATUS      Incontinent Diet (Dysphagia 3)  AMBULATORY STATUS COMMUNICATION OF NEEDS Skin   Limited Assist Verbally PU Stage and Appropriate Care   PU Stage 2 Dressing:  (Every other day)                   Personal Care Assistance Level of Assistance  Bathing, Feeding, Dressing Bathing Assistance: Limited assistance Feeding assistance: Independent Dressing Assistance: Limited assistance     Functional Limitations Info  Sight, Hearing, Speech Sight Info: Adequate Hearing Info: Adequate Speech Info: Adequate    SPECIAL CARE FACTORS FREQUENCY  PT (By licensed PT), OT (By licensed OT)     PT Frequency: Minimum 5x a week OT Frequency: Minimum 5x a week            Contractures Contractures Info: Not present    Additional Factors Info  Code Status, Allergies, Insulin Sliding Scale Code Status Info: DNR Allergies Info: NKA   Insulin Sliding Scale Info: insulin aspart (novoLOG) injection 0-9 Units 3x a day with meals.       Current Medications (12/05/2021):  This is the current hospital active medication list Current Facility-Administered  Medications  Medication Dose Route Frequency Provider Last Rate Last Admin   0.9 % NaCl with KCl 20 mEq/ L  infusion   Intravenous Continuous Edwin Dada, MD 75 mL/hr at 12/05/21 0529 New Bag at 12/05/21 0529   acetaminophen (TYLENOL) tablet 650 mg  650 mg Oral Q6H PRN Lenore Cordia, MD       Or   acetaminophen (TYLENOL) suppository 650 mg  650 mg Rectal  Q6H PRN Lenore Cordia, MD       aspirin EC tablet 81 mg  81 mg Oral Daily Lenore Cordia, MD   81 mg at 12/04/21 0800   Chlorhexidine Gluconate Cloth 2 % PADS 6 each  6 each Topical Daily Danford, Suann Larry, MD   6 each at 12/04/21 0800   docusate sodium (COLACE) capsule 100 mg  100 mg Oral QHS Zada Finders R, MD   100 mg at 12/03/21 2114   dronabinol (MARINOL) capsule 5 mg  5 mg Oral BID AC Zada Finders R, MD   5 mg at 12/05/21 0837   feeding supplement (GLUCERNA SHAKE) (GLUCERNA SHAKE) liquid 237 mL  237 mL Oral TID WC Danford, Suann Larry, MD   237 mL at 12/05/21 0837   insulin aspart (novoLOG) injection 0-5 Units  0-5 Units Subcutaneous QHS Zada Finders R, MD   2 Units at 12/03/21 2145   insulin aspart (novoLOG) injection 0-9 Units  0-9 Units Subcutaneous TID WC Lenore Cordia, MD   2 Units at 12/05/21 3875   loperamide (IMODIUM) capsule 2 mg  2 mg Oral PRN Edwin Dada, MD   2 mg at 12/04/21 2224   multivitamin with minerals tablet 1 tablet  1 tablet Oral Daily Danford, Suann Larry, MD   1 tablet at 12/04/21 0800   ondansetron (ZOFRAN) tablet 4 mg  4 mg Oral Q6H PRN Lenore Cordia, MD       Or   ondansetron (ZOFRAN) injection 4 mg  4 mg Intravenous Q6H PRN Lenore Cordia, MD       piperacillin-tazobactam (ZOSYN) IVPB 3.375 g  3.375 g Intravenous Q8H Wofford, Drew A, RPH 12.5 mL/hr at 12/05/21 0527 3.375 g at 12/05/21 0527   polyethylene glycol (MIRALAX / GLYCOLAX) packet 17 g  17 g Oral Daily Zada Finders R, MD   17 g at 12/03/21 0801   senna-docusate (Senokot-S) tablet 1 tablet  1 tablet Oral QHS PRN Lenore Cordia, MD       thiamine (VITAMIN B1) tablet 100 mg  100 mg Oral Daily Zada Finders R, MD   100 mg at 12/04/21 0800     Discharge Medications: Please see discharge summary for a list of discharge medications.  Relevant Imaging Results:  Relevant Lab Results:   Additional Information SSN [id number]  Ross Ludwig, LCSW

## 2021-12-05 NOTE — Plan of Care (Signed)
  Problem: Education: Goal: Knowledge of General Education information will improve Description: Including pain rating scale, medication(s)/side effects and non-pharmacologic comfort measures Outcome: Not Progressing   Problem: Health Behavior/Discharge Planning: Goal: Ability to manage health-related needs will improve Outcome: Not Progressing   Problem: Clinical Measurements: Goal: Ability to maintain clinical measurements within normal limits will improve Outcome: Progressing Goal: Diagnostic test results will improve Outcome: Progressing Goal: Respiratory complications will improve Outcome: Progressing Goal: Cardiovascular complication will be avoided Outcome: Progressing   Problem: Activity: Goal: Risk for activity intolerance will decrease Outcome: Not Progressing   Problem: Nutrition: Goal: Adequate nutrition will be maintained Outcome: Progressing   Problem: Coping: Goal: Level of anxiety will decrease Outcome: Progressing   Problem: Elimination: Goal: Will not experience complications related to bowel motility Outcome: Progressing Goal: Will not experience complications related to urinary retention Outcome: Progressing   Problem: Pain Managment: Goal: General experience of comfort will improve Outcome: Progressing   Problem: Safety: Goal: Ability to remain free from injury will improve Outcome: Progressing   Problem: Skin Integrity: Goal: Risk for impaired skin integrity will decrease Outcome: Progressing   Problem: Coping: Goal: Ability to adjust to condition or change in health will improve Outcome: Progressing   Problem: Fluid Volume: Goal: Ability to maintain a balanced intake and output will improve Outcome: Progressing   Problem: Metabolic: Goal: Ability to maintain appropriate glucose levels will improve Outcome: Progressing   Problem: Education: Goal: Ability to describe self-care measures that may prevent or decrease complications  (Diabetes Survival Skills Education) will improve Outcome: Not Progressing   Problem: Skin Integrity: Goal: Risk for impaired skin integrity will decrease Outcome: Progressing   Problem: Nutritional: Goal: Maintenance of adequate nutrition will improve Outcome: Progressing Goal: Progress toward achieving an optimal weight will improve Outcome: Progressing

## 2021-12-05 NOTE — TOC Transition Note (Addendum)
Transition of Care [facility]) - CM/SW Discharge Note   Patient Details  Name: Charles [name] MRN: [id number] Date of Birth: 09-09-1960  [name] CM/SW Contact:  Charles Cleaver, LCSW Phone Number: 12/05/2021, 11:06 AM   Clinical Narrative:     9:42am  CSW asked Charles [name] at [name] via secure text, if patient was LTC or short term rehab.    10:15am  Per Charles [name] via secure text she said patient is LTC resident.  CSW then asked if he can come back today, she said,  "yes, yes, yes,"   CSW told her MD will be notified, she said thank you.    10:33am CSW notified attending physician via secure chat that patient is LTC per Charles [name] and he can return today.  Physician completed DC summary and put DC order in for patient to go to SNF.  10:53am  CSW sent DC summary to SNF via [name].  10:59am  CSW notified Charles [name] via secure text that DC summary was just sent, and CSW asked for room and report, Charles [name] secure texted CSW room 138A [facility] nurse report, [phone].  11:00am  CSW spoke to patient's sister  Charles [name] and made her aware that patient is discharging today.  Patient's sister wanted to speak to attending physician,   11:02am CSW notified attending physician sister wanted to speak to him, he said he said he will call her.   11:04am CSW contacted Charles [name] at [name] to give referral for outpatient palliative to follow patient at [name].   11:23am  CSW called [facility] and set up EMS transport for patient back to SNF, CSW notified bedside nurse that EMS has been set up.  CSW also  12:30pm  [facility] arrived to transport patient to SNF.  12:48pm  CSW received phone call from Charles [name] at [name], she said, "Patient has arrived, but we have to get insurance authorization."  CSW informed and reminded Charles [name] that she told CSW patient was under LTC and can return today.  CSW informed her that PT did work with patient today, but they are going to  have to pursue insurance authorization since she told me he was LTC.  Charles [name] said she will take care of it.    Per Charles [name] she said they have not been able to get a hold of patient's sister, and it goes to voice mail.  CSW provided Charles [name] with the phone number for sister that was listed in chart.  Charles [name] then finished speaking to CSW, and hung up.  3:32pm  CSW received secure chat message from [name] from [facility], stating she just received a telephone call from Charles [name] with Charles [name] asking who approved SNF.  Charles [name] requested to have CSW call her at [phone]  x: [phone].  CSW informed Charles [name] that Charles [name] in admissions at SNF said patient was LTC and has been living there for awhile.  CSW told Charles [name] that this CSW Will call Charles [name] and talk to her.  4:25pm  CSW contacted Charles [name] from [name] and told her that SNF admissions worker informed this CSW that patient is LTC and can return today.  Charles [name] then said she has already spoken to SNF.  CSW was then asked by Charles [name], what difference does it make if patient is LTC or not.  CSW explained to her, that if SNF tells CSW that patient is LTC they have to accept patient back and do not have to get insurance authorization prior  to discharge back to SNF.  CSW then told Charles [name] that when patients are considered LTC, they are usually paying privately some how, or have Medicaid.  CSW then told Charles [name] she is going to have to work that out with SNF and CSW provided the phone number again for SNF so Charles [name] from [name] can talk with facility.    Patient to be d/c'ed today to [facility] room 138A.  Patient and family agreeable to plans will transport via ems RN to call report to [name] nurse [phone].  CSW spoke to patient's sister she is aware that patient will be discharging today.    Palliative recommending outpatient palliative to follow at SNF, referral made to Charles [name] at [name].  CSW signing off, please reconsult if other social work  needs arise.  Final next level of care: Skilled Nursing Facility Barriers to Discharge: Barriers Resolved   Patient Goals and CMS Choice Patient states their goals for this hospitalization and ongoing recovery are:: To return back to SNF. CMS Medicare.gov Compare Post Acute Care list provided to:: Patient Represenative (must comment) Choice offered to / list presented to : Charles [name] / Guardian  Discharge Placement   Existing PASRR number confirmed : 12/05/21          Patient chooses bed at: Other - please specify in the comment section below: ([name]) Patient to be transferred to facility by: [facility] Name of family member notified: Charles [name] [phone] Patient and family notified of of transfer: 12/05/21  Discharge Plan and Services   Discharge Planning Services: CM Consult Post Acute Care Choice: [place]                               Social Determinants of Health (SDOH) Interventions     Readmission Risk Interventions    07/04/2021    2:49 PM  Readmission Risk Prevention Plan  Transportation Screening Complete  PCP or Specialist Appt within 5-7 Days Complete  Home Care Screening Complete

## 2021-12-19 ENCOUNTER — Emergency Department (HOSPITAL_COMMUNITY): Payer: Commercial Managed Care - HMO

## 2021-12-19 ENCOUNTER — Other Ambulatory Visit: Payer: Self-pay

## 2021-12-19 ENCOUNTER — Emergency Department (HOSPITAL_COMMUNITY)
Admission: EM | Admit: 2021-12-19 | Discharge: 2021-12-19 | Disposition: A | Discharge status: Home / Self Care | Payer: Commercial Managed Care - HMO | Attending: Emergency Medicine | Admitting: Emergency Medicine

## 2021-12-19 DIAGNOSIS — S0993XA Unspecified injury of face, initial encounter: Secondary | ICD-10-CM | POA: Diagnosis present

## 2021-12-19 DIAGNOSIS — F0390 Unspecified dementia without behavioral disturbance: Secondary | ICD-10-CM | POA: Diagnosis not present

## 2021-12-19 DIAGNOSIS — Z7982 Long term (current) use of aspirin: Secondary | ICD-10-CM | POA: Diagnosis not present

## 2021-12-19 DIAGNOSIS — E119 Type 2 diabetes mellitus without complications: Secondary | ICD-10-CM | POA: Insufficient documentation

## 2021-12-19 DIAGNOSIS — S0083XA Contusion of other part of head, initial encounter: Secondary | ICD-10-CM | POA: Diagnosis not present

## 2021-12-19 DIAGNOSIS — I10 Essential (primary) hypertension: Secondary | ICD-10-CM | POA: Diagnosis not present

## 2021-12-19 DIAGNOSIS — Z951 Presence of aortocoronary bypass graft: Secondary | ICD-10-CM | POA: Diagnosis not present

## 2021-12-19 DIAGNOSIS — Y92003 Bedroom of unspecified non-institutional (private) residence as the place of occurrence of the external cause: Secondary | ICD-10-CM | POA: Insufficient documentation

## 2021-12-19 DIAGNOSIS — I2510 Atherosclerotic heart disease of native coronary artery without angina pectoris: Secondary | ICD-10-CM | POA: Insufficient documentation

## 2021-12-19 DIAGNOSIS — W06XXXA Fall from bed, initial encounter: Secondary | ICD-10-CM | POA: Diagnosis not present

## 2021-12-19 MED ORDER — ACETAMINOPHEN 500 MG PO TABS: 1000.0000 mg | ORAL_TABLET | Freq: Once | ORAL | Status: DC

## 2021-12-19 NOTE — ED Triage Notes (Signed)
Pt BIB EMS due to a fall. Pt was rolling over in bed, receiving insulin and rolled out of bed. Pt fell on face, no blood thinners, no LOC. Pt is not in any pain. Axox4. Pt is hypotensive.

## 2021-12-19 NOTE — ED Notes (Signed)
[facility] called  

## 2021-12-19 NOTE — ED Provider Notes (Signed)
[facility] EMERGENCY DEPARTMENT Provider Note  CSN: [id number] Arrival date & time: 12/19/21 0818  Chief Complaint(s) Fall  HPI Charles [name] is a 61 y.o. male with history of diabetes, coronary artery disease, BPH, catheter dependent presenting with fall.  Patient was at his nursing facility, he reports that he reached over to grab something on the side table when he began to slide off and from then fell off the bed.  He fell on his left side.  He denies any pain currently.  No loss of consciousness.  He remembers the whole incident.  Symptoms mild.  His facility wanted him to come to the emergency department for evaluation.   Past Medical History Past Medical History:  Diagnosis Date   Arthritis    Diabetes mellitus without complication ([place])    Heart disease    Hypertension    Patient Active Problem List   Diagnosis Date Noted   Dementia ([place]) 11/30/2021   Pressure injury of sacral region, stage 2 ([place]) 11/30/2021   Multiple fractures of ribs, left side, initial encounter for closed fracture 11/29/2021   Failure to thrive in adult 11/29/2021   Anemia of chronic disease 09/10/2021   Catheter-associated urinary tract infection ([place]) 08/27/2021   Goals of care, counseling/discussion 08/26/2021   Delirium 08/23/2021   Confusion 08/23/2021   Unintentional weight loss of more than 10% body weight within 6 months 08/21/2021   Hemorrhoids 08/21/2021   Diverticulosis of colon 08/21/2021   Solitary rectal ulcer 08/21/2021   Polyp of ascending colon 08/21/2021   Hypokalemia 08/21/2021   Acute blood loss anemia 08/20/2021   Chronic orthostatic hypotension 08/20/2021   Protein-calorie malnutrition, severe ([place]) 08/20/2021   Hypomagnesemia 08/20/2021   Physical deconditioning 08/20/2021   Acute lower GI bleeding 08/19/2021   Pressure injury of skin 07/02/2021   Hypothermia 06/30/2021   Urinary retention 04/23/2021   Acute prerenal azotemia 04/22/2021    Leukocytosis 04/22/2021   HLD (hyperlipidemia) 92/42/6834   Acute metabolic encephalopathy 19/62/2297   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/23/2021   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 Authorizing Provider  aspirin EC 81 MG tablet Take 1 tablet (81 mg total) by mouth daily. Swallow whole. 09/14/21   Pokhrel, Corrie Mckusick, MD  blood glucose meter kit and supplies Dispense based on patient and insurance preference. Use up to four times daily as directed. (FOR ICD-10 E10.9, E11.9). 12/23/20   [place], Blondell Reveal, MD  docusate sodium (COLACE) 100 MG capsule Take 1 capsule (100 mg total) by mouth at bedtime. 09/11/21 12/20/21  Pokhrel, Corrie Mckusick, MD  dronabinol (MARINOL) 5 MG capsule Take 5 mg by mouth 2 (two) times daily before a meal.    [provider]  ezetimibe (ZETIA) 10 MG tablet Take 1 tablet (10 mg total) by mouth daily. 07/07/21   Thurnell Lose, MD  Glucerna (GLUCERNA) LIQD Take 237 mLs by mouth 3 (three) times daily with meals.    [provider]  hydrocortisone (ANUSOL-HC) 2.5 % rectal cream Place rectally 2 (two) times daily. 09/11/21   Pokhrel, Corrie Mckusick, MD  insulin lispro (HUMALOG) 100 UNIT/ML KwikPen Inject 2-10 Units into the skin See admin instructions. Inject 2-10 units into the skin daily with meals, PER SLIDING SCALE: BGL 140-199 = 2 units; 200-250 =  4 units; 251-299 = 6 units; 300-349 = 8 units; 350+ = 10 units; <70 or >350, CALL MD    [provider]  Lido-PE-Glycerin-Petrolatum (PREPARATION H RAPID RELIEF) 5-0.25-14.4-15 % CREA Place 1 Application rectally every 6 (six) hours as needed (hemorrhoids).    [provider]  loperamide (IMODIUM) 2 MG capsule Take 1 capsule (2 mg total) by mouth as needed for diarrhea or loose stools.  12/05/21   Danford, Suann Larry, MD  methenamine (HIPREX) 1 g tablet Take 1 g by mouth 2 (two) times daily with a meal.    [provider]  midodrine (PROAMATINE) 10 MG tablet Take 1 tablet (10 mg total) by mouth 3 (three) times daily with meals. 07/07/21   Thurnell Lose, MD  Multiple Vitamin (MULTIVITAMIN WITH MINERALS) TABS tablet Take 1 tablet by mouth daily. 09/01/21   Hosie Poisson, MD  NON FORMULARY Take 118 mLs by mouth See admin instructions. Magic Cup desert cup- Eat 1 "cup" (118 ml's) by mouth 2 times a day- with lunch and supper    [provider]  pantoprazole (PROTONIX) 40 MG tablet Take 1 tablet (40 mg total) by mouth daily. Any generic PPI is okay to dispense Patient taking differently: Take 40 mg by mouth daily before breakfast. 04/25/21   Rai, Ripudeep K, MD  polyethylene glycol (MIRALAX / GLYCOLAX) 17 g packet Take 17 g by mouth daily. 09/11/21   Pokhrel, Corrie Mckusick, MD  rosuvastatin (CRESTOR) 40 MG tablet Take 1 tablet (40 mg total) by mouth daily. 04/25/21   Rai, Vernelle Emerald, MD  thiamine 100 MG tablet Take 1 tablet (100 mg total) by mouth daily. 09/01/21   Hosie Poisson, MD                                                                                                                                    Past Surgical History Past Surgical History:  Procedure Laterality Date   COLONOSCOPY WITH PROPOFOL N/A 08/21/2021   Procedure: COLONOSCOPY WITH PROPOFOL;  Surgeon: Daryel November, MD;  Location: [facility];  Service: Gastroenterology;  Laterality: N/A;   CORONARY ARTERY BYPASS GRAFT     ESOPHAGOGASTRODUODENOSCOPY (EGD) WITH PROPOFOL N/A 08/21/2021   Procedure: ESOPHAGOGASTRODUODENOSCOPY (EGD) WITH PROPOFOL;  Surgeon: Daryel November, MD;  Location: [facility];  Service: Gastroenterology;  Laterality: N/A;   POLYPECTOMY  08/21/2021   Procedure: POLYPECTOMY;  Surgeon: Daryel November, MD;  Location[facility] ENDOSCOPY;  Service: Gastroenterology;;   Family  History Family History  Problem Relation Age of Onset   Diabetes Mother    Hypertension Mother    Diabetes Father    Hypertension Father    Heart attack Father    Diabetes Sister    Diabetes Sister     Social History Social History   Tobacco Use   Smoking status: Never   Smokeless tobacco: Never  Vaping Use   Vaping Use: Never used  Substance Use Topics   Alcohol use: Not Currently    Comment: none in 6 months may open a bottle of wine tonight 11/1112022   Drug use: Never   Allergies Patient has no known allergies.  Review of Systems Review of Systems  All other systems reviewed and are negative.   Physical Exam Vital Signs  I have reviewed the triage vital signs BP 105/70   Pulse 66   Temp (!) 97.4 F (36.3 C) (Oral)   Resp 16   SpO2 100%  Physical Exam Vitals and nursing note reviewed.  Constitutional:      General: He is not in acute distress.    Appearance: Normal appearance.     Comments: Cachectic appearing  HENT:     Head:     Comments: Small abrasion to left face.  No laceration    Mouth/Throat:     Mouth: Mucous membranes are moist.  Eyes:     Conjunctiva/sclera: Conjunctivae normal.     Comments: Left eye lateral subconjunctival hemorrhage.  Vision grossly intact.  Pupils equal and reactive to light.  Extraocular movements intact  Neck:     Comments: No midline C, T, L-spine tenderness Cardiovascular:     Rate and Rhythm: Normal rate and regular rhythm.  Pulmonary:     Effort: Pulmonary effort is normal. No respiratory distress.     Breath sounds: Normal breath sounds.  Abdominal:     General: Abdomen is flat.     Palpations: Abdomen is soft.     Tenderness: There is no abdominal tenderness.  Musculoskeletal:     Right lower leg: No edema.     Left lower leg: No edema.     Comments: Normal range of motion of the bilateral upper and lower extremities without focal pain or tenderness  Skin:    General: Skin is warm and dry.      Capillary Refill: Capillary refill takes less than 2 seconds.  Neurological:     Mental Status: He is alert and oriented to person, place, and time. Mental status is at baseline.     Comments: Generalized weakness but no focal deficit.  Moves all extremities equally.  No facial droop  Psychiatric:        Mood and Affect: Mood normal.        Behavior: Behavior normal.     ED Results and Treatments Labs (all labs ordered are listed, but only abnormal results are displayed) Labs Reviewed - No data to display                                                                                                                        Radiology CT Head Wo Contrast  Result Date: 12/19/2021 CLINICAL DATA:  Fall out of bed with head trauma. EXAM: CT HEAD WITHOUT CONTRAST CT CERVICAL SPINE WITHOUT CONTRAST TECHNIQUE: Multidetector CT imaging of the head and cervical spine was performed following the standard protocol without intravenous contrast. Multiplanar CT image reconstructions  of the cervical spine were also generated. RADIATION DOSE REDUCTION: This exam was performed according to the departmental dose-optimization program which includes automated exposure control, adjustment of the mA and/or kV according to patient size and/or use of iterative reconstruction technique. COMPARISON:  07/02/2021 FINDINGS: CT HEAD FINDINGS Brain: Mild stable prominence of the lateral and third ventricles. Remaining cisterns and CSF spaces are within normal. There is mild chronic ischemic microvascular disease. Small lacunar infarct over the right basal ganglia. 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 No evidence of mass effect, shift of midline structures or acute hemorrhage. No acute infarction. Vascular: No hyperdense vessel or unexpected calcification. Skull: Normal. Sinuses/Orbits: Orbits are normal. Paranasal sinuses are well developed and demonstrate mucosal membrane thickening involving the right maxillary sinus. Other: None. CT  CERVICAL SPINE FINDINGS Alignment: Normal. Skull base and vertebrae: Vertebral body heights are normal. There is mild spondylosis throughout the cervical spine to include moderate uncovertebral joint spurring and mild facet arthropathy.6 severe right-sided neural foraminal narrowing at the C3-4 level due to uncovertebral joint spurring. No acute fracture. Soft tissues and spinal canal: Narrowing of the right-side of the spinal canal at the C3-4 level due to the moderate uncovertebral joint spurring. Prevertebral soft tissues are normal. Disc levels:  No focal abnormality. Upper chest: No acute findings. Other: None. IMPRESSION: 1. No acute brain injury. 2. Mild chronic ischemic microvascular disease. 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 3. No acute cervical spine injury. 4. Mild spondylosis throughout the cervical spine with severe right-sided neural foraminal narrowing at the C3-4 level due to uncovertebral joint spurring. Electronically Signed   By: Marin Olp M.D.   On: 12/19/2021 09:30   CT Cervical Spine Wo Contrast  Result Date: 12/19/2021 CLINICAL DATA:  Fall out of bed with head trauma. EXAM: CT HEAD WITHOUT CONTRAST CT CERVICAL SPINE WITHOUT CONTRAST TECHNIQUE: Multidetector CT imaging of the head and cervical spine was performed following the standard protocol without intravenous contrast. Multiplanar CT image reconstructions of the cervical spine were also generated. RADIATION DOSE REDUCTION: This exam was performed according to the departmental dose-optimization program which includes automated exposure control, adjustment of the mA and/or kV according to patient size and/or use of iterative reconstruction technique. COMPARISON:  07/02/2021 FINDINGS: CT HEAD FINDINGS Brain: Mild stable prominence of the lateral and third ventricles. Remaining cisterns and CSF spaces are within normal. There is mild chronic ischemic microvascular disease.  Small lacunar infarct over the right basal ganglia. 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 No evidence of mass effect, shift of midline structures or acute hemorrhage. No acute infarction. Vascular: No hyperdense vessel or unexpected calcification. Skull: Normal. Sinuses/Orbits: Orbits are normal. Paranasal sinuses are well developed and demonstrate mucosal membrane thickening involving the right maxillary sinus. Other: None. CT CERVICAL SPINE FINDINGS Alignment: Normal. Skull base and vertebrae: Vertebral body heights are normal. There is mild spondylosis throughout the cervical spine to include moderate uncovertebral joint spurring and mild facet arthropathy.6 severe right-sided neural foraminal narrowing at the C3-4 level due to uncovertebral joint spurring. No acute fracture. Soft tissues and spinal canal: Narrowing of the right-side of the spinal canal at the C3-4 level due to the moderate uncovertebral joint spurring. Prevertebral soft tissues are normal. Disc levels:  No focal abnormality. Upper chest: No acute findings. Other: None. IMPRESSION: 1. No acute brain injury. 2. Mild chronic ischemic microvascular disease. 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 3. No  acute cervical spine injury. 4. Mild spondylosis throughout the cervical spine with severe right-sided neural foraminal narrowing at the C3-4 level due to uncovertebral joint spurring. Electronically Signed   By: Marin Olp M.D.   On: 12/19/2021 09:30    Pertinent labs & imaging results that were available during my care of the patient were reviewed by me and considered in my medical decision making (see MDM for details).  Medications Ordered in ED Medications  acetaminophen (TYLENOL) tablet 1,000 mg (1,000 mg Oral Patient Refused/Not Given 12/19/21 0829)                                                                                                                                      Procedures Procedures  (including critical care time)  Medical Decision Making / ED Course   MDM:  61 year old male presenting after fall.  Patient chronically ill-appearing but in no acute distress.  Oriented and remembers events of fall.  He denies any pain currently.  He has a small abrasion on his face and a left-sided conjunctival hemorrhage.  Given his overall frailty will obtain CT head and neck.  No evidence of alternative traumatic injury on exam.  No signs of open globe. if this is negative likely discharge back to facility.  Clinical Course as of 12/19/21 0959  Mon Dec 19, 2021  0957 CT head and CT C-spine negative. Will discharge patient to facility. All questions answered. Patient comfortable with plan of discharge. Return precautions discussed with patient and specified on the after visit summary.  [WS]    Clinical Course User Index [WS] Cristie Hem, MD     Additional history obtained: -Additional history obtained from ems -External records from outside source obtained and reviewed including: Chart review including previous notes, labs, imaging, consultation notes   Imaging Studies ordered: I ordered imaging studies including CT head and neck On my interpretation imaging demonstrates no acute fracture/bleeding I independently visualized and interpreted imaging. I agree with the radiologist interpretation   Medicines ordered and prescription drug management: Meds ordered this encounter  Medications   acetaminophen (TYLENOL) tablet 1,000 mg    -I have reviewed the patients home medicines and have made adjustments as needed   Social Determinants of Health:  Factors impacting patients care include: self care deficit   Reevaluation: After the interventions noted above, I reevaluated the patient and found that they have improved  Co morbidities that complicate the patient evaluation  Past Medical History:  Diagnosis Date   Arthritis     Diabetes mellitus without complication ([place])    Heart disease    Hypertension       Dispostion: Discharge    Final Clinical Impression(s) / ED Diagnoses Final diagnoses:  Fall from bed, initial encounter  Contusion of face, initial encounter     This chart was dictated using voice recognition software.  Despite best efforts to proofread,  errors  can occur which can change the documentation meaning.    Cristie Hem, MD 12/19/21 [phone]

## 2022-01-26 ENCOUNTER — Emergency Department (HOSPITAL_COMMUNITY): Payer: Commercial Managed Care - HMO

## 2022-01-26 ENCOUNTER — Other Ambulatory Visit: Payer: Self-pay

## 2022-01-26 ENCOUNTER — Inpatient Hospital Stay (HOSPITAL_COMMUNITY)
Admission: EM | Admit: 2022-01-26 | Discharge: 2022-01-30 | DRG: 698 | Disposition: A | Discharge status: Skilled Nursing Facility | Payer: Commercial Managed Care - HMO | Source: Skilled Nursing Facility | Attending: Internal Medicine | Admitting: Internal Medicine

## 2022-01-26 ENCOUNTER — Encounter (HOSPITAL_COMMUNITY): Payer: Self-pay | Admitting: Internal Medicine

## 2022-01-26 DIAGNOSIS — Y846 Urinary catheterization as the cause of abnormal reaction of the patient, or of later complication, without mention of misadventure at the time of the procedure: Secondary | ICD-10-CM | POA: Diagnosis present

## 2022-01-26 DIAGNOSIS — F03A3 Unspecified dementia, mild, with mood disturbance: Secondary | ICD-10-CM | POA: Diagnosis present

## 2022-01-26 DIAGNOSIS — E785 Hyperlipidemia, unspecified: Secondary | ICD-10-CM | POA: Diagnosis present

## 2022-01-26 DIAGNOSIS — Z1152 Encounter for screening for COVID-19: Secondary | ICD-10-CM

## 2022-01-26 DIAGNOSIS — A4159 Other Gram-negative sepsis: Secondary | ICD-10-CM | POA: Diagnosis present

## 2022-01-26 DIAGNOSIS — E8809 Other disorders of plasma-protein metabolism, not elsewhere classified: Secondary | ICD-10-CM | POA: Diagnosis present

## 2022-01-26 DIAGNOSIS — G9341 Metabolic encephalopathy: Secondary | ICD-10-CM | POA: Diagnosis present

## 2022-01-26 DIAGNOSIS — Z1624 Resistance to multiple antibiotics: Secondary | ICD-10-CM | POA: Diagnosis present

## 2022-01-26 DIAGNOSIS — N390 Urinary tract infection, site not specified: Secondary | ICD-10-CM | POA: Diagnosis present

## 2022-01-26 DIAGNOSIS — Z597 Insufficient social insurance and welfare support: Secondary | ICD-10-CM

## 2022-01-26 DIAGNOSIS — R6520 Severe sepsis without septic shock: Secondary | ICD-10-CM | POA: Diagnosis present

## 2022-01-26 DIAGNOSIS — E119 Type 2 diabetes mellitus without complications: Secondary | ICD-10-CM | POA: Diagnosis not present

## 2022-01-26 DIAGNOSIS — Z87440 Personal history of urinary (tract) infections: Secondary | ICD-10-CM | POA: Diagnosis not present

## 2022-01-26 DIAGNOSIS — Z951 Presence of aortocoronary bypass graft: Secondary | ICD-10-CM

## 2022-01-26 DIAGNOSIS — A419 Sepsis, unspecified organism: Secondary | ICD-10-CM | POA: Diagnosis not present

## 2022-01-26 DIAGNOSIS — E43 Unspecified severe protein-calorie malnutrition: Secondary | ICD-10-CM | POA: Diagnosis present

## 2022-01-26 DIAGNOSIS — Z79899 Other long term (current) drug therapy: Secondary | ICD-10-CM

## 2022-01-26 DIAGNOSIS — D638 Anemia in other chronic diseases classified elsewhere: Secondary | ICD-10-CM | POA: Diagnosis present

## 2022-01-26 DIAGNOSIS — E782 Mixed hyperlipidemia: Secondary | ICD-10-CM | POA: Diagnosis not present

## 2022-01-26 DIAGNOSIS — I110 Hypertensive heart disease with heart failure: Secondary | ICD-10-CM | POA: Diagnosis present

## 2022-01-26 DIAGNOSIS — F32A Depression, unspecified: Secondary | ICD-10-CM | POA: Diagnosis present

## 2022-01-26 DIAGNOSIS — Z681 Body mass index (BMI) 19 or less, adult: Secondary | ICD-10-CM | POA: Diagnosis not present

## 2022-01-26 DIAGNOSIS — Z8679 Personal history of other diseases of the circulatory system: Secondary | ICD-10-CM | POA: Diagnosis not present

## 2022-01-26 DIAGNOSIS — E860 Dehydration: Secondary | ICD-10-CM | POA: Diagnosis present

## 2022-01-26 DIAGNOSIS — T83511A Infection and inflammatory reaction due to indwelling urethral catheter, initial encounter: Secondary | ICD-10-CM | POA: Diagnosis present

## 2022-01-26 DIAGNOSIS — Z8249 Family history of ischemic heart disease and other diseases of the circulatory system: Secondary | ICD-10-CM

## 2022-01-26 DIAGNOSIS — N3000 Acute cystitis without hematuria: Secondary | ICD-10-CM | POA: Diagnosis not present

## 2022-01-26 DIAGNOSIS — Z66 Do not resuscitate: Secondary | ICD-10-CM | POA: Diagnosis present

## 2022-01-26 DIAGNOSIS — E1165 Type 2 diabetes mellitus with hyperglycemia: Secondary | ICD-10-CM | POA: Diagnosis present

## 2022-01-26 DIAGNOSIS — I951 Orthostatic hypotension: Secondary | ICD-10-CM | POA: Diagnosis present

## 2022-01-26 DIAGNOSIS — Z7982 Long term (current) use of aspirin: Secondary | ICD-10-CM

## 2022-01-26 DIAGNOSIS — Z833 Family history of diabetes mellitus: Secondary | ICD-10-CM

## 2022-01-26 DIAGNOSIS — M1990 Unspecified osteoarthritis, unspecified site: Secondary | ICD-10-CM | POA: Diagnosis present

## 2022-01-26 DIAGNOSIS — Z794 Long term (current) use of insulin: Secondary | ICD-10-CM

## 2022-01-26 DIAGNOSIS — I5032 Chronic diastolic (congestive) heart failure: Secondary | ICD-10-CM | POA: Diagnosis present

## 2022-01-26 DIAGNOSIS — R531 Weakness: Principal | ICD-10-CM

## 2022-01-26 HISTORY — DX: Chronic diastolic (congestive) heart failure: I50.32

## 2022-01-26 HISTORY — DX: Unspecified dementia, unspecified severity, without behavioral disturbance, psychotic disturbance, mood disturbance, and anxiety: F03.90

## 2022-01-26 HISTORY — DX: Personal history of urinary (tract) infections: Z87.440

## 2022-01-26 LAB — CBC WITH DIFFERENTIAL/PLATELET
Abs Immature Granulocytes: 0.05 10*3/uL (ref 0.00–0.07)
Basophils Absolute: 0.1 10*3/uL (ref 0.0–0.1)
Basophils Relative: 0 %
Eosinophils Absolute: 0.1 10*3/uL (ref 0.0–0.5)
Eosinophils Relative: 1 %
HCT: 32.6 % — ABNORMAL LOW (ref 39.0–52.0)
Hemoglobin: 10.5 g/dL — ABNORMAL LOW (ref 13.0–17.0)
Immature Granulocytes: 0 %
Lymphocytes Relative: 7 %
Lymphs Abs: 0.9 10*3/uL (ref 0.7–4.0)
MCH: 27.9 pg (ref 26.0–34.0)
MCHC: 32.2 g/dL (ref 30.0–36.0)
MCV: 86.5 fL (ref 80.0–100.0)
Monocytes Absolute: 0.9 10*3/uL (ref 0.1–1.0)
Monocytes Relative: 7 %
Neutro Abs: 11.1 10*3/uL — ABNORMAL HIGH (ref 1.7–7.7)
Neutrophils Relative %: 85 %
Platelets: 314 10*3/uL (ref 150–400)
RBC: 3.77 MIL/uL — ABNORMAL LOW (ref 4.22–5.81)
RDW: 15.7 % — ABNORMAL HIGH (ref 11.5–15.5)
WBC: 13.1 10*3/uL — ABNORMAL HIGH (ref 4.0–10.5)
nRBC: 0 % (ref 0.0–0.2)

## 2022-01-26 LAB — URINALYSIS, ROUTINE W REFLEX MICROSCOPIC
Bilirubin Urine: NEGATIVE
Glucose, UA: >=500 mg/dL — AB
Ketones, ur: NEGATIVE mg/dL
Nitrite: POSITIVE — AB
Protein, ur: 30 mg/dL — AB
RBC / HPF: >50 RBC/hpf — ABNORMAL HIGH (ref 0–5)
Specific Gravity, Urine: 1.015 (ref 1.005–1.030)
WBC, UA: >50 WBC/hpf — ABNORMAL HIGH (ref 0–5)
pH: 8 (ref 5.0–8.0)

## 2022-01-26 LAB — COMPREHENSIVE METABOLIC PANEL
ALT: 26 U/L (ref 0–44)
AST: 20 U/L (ref 15–41)
Albumin: 2.8 g/dL — ABNORMAL LOW (ref 3.5–5.0)
Alkaline Phosphatase: 99 U/L (ref 38–126)
Anion gap: 7 (ref 5–15)
BUN: 10 mg/dL (ref 6–20)
CO2: 28 mmol/L (ref 22–32)
Calcium: 8.9 mg/dL (ref 8.9–10.3)
Chloride: 100 mmol/L (ref 98–111)
Creatinine, Ser: 0.66 mg/dL (ref 0.61–1.24)
GFR, Estimated: >60 mL/min (ref >60)
Glucose, Bld: 193 mg/dL — ABNORMAL HIGH (ref 70–99)
Potassium: 4.4 mmol/L (ref 3.5–5.1)
Sodium: 135 mmol/L (ref 135–145)
Total Bilirubin: 0.5 mg/dL (ref 0.3–1.2)
Total Protein: 7.9 g/dL (ref 6.5–8.1)

## 2022-01-26 LAB — RESP PANEL BY RT-PCR (FLU A&B, COVID) ARPGX2
Influenza A by PCR: NEGATIVE
Influenza B by PCR: NEGATIVE
SARS Coronavirus 2 by RT PCR: NEGATIVE

## 2022-01-26 LAB — APTT: aPTT: 41 seconds — ABNORMAL HIGH (ref 24–36)

## 2022-01-26 LAB — LACTIC ACID, PLASMA
Lactic Acid, Venous: 2.5 mmol/L (ref 0.5–1.9)
Lactic Acid, Venous: 1.3 mmol/L (ref 0.5–1.9)

## 2022-01-26 LAB — PROTIME-INR
INR: 1 (ref 0.8–1.2)
Prothrombin Time: 12.9 seconds (ref 11.4–15.2)

## 2022-01-26 MED ORDER — GLUCERNA 1.2 CAL PO LIQD
237.0000 mL | Freq: Three times a day (TID) | ORAL | Status: DC
  Filled 2022-01-26: qty 237

## 2022-01-26 MED ORDER — ROSUVASTATIN CALCIUM 10 MG PO TABS
20.0000 mg | ORAL_TABLET | Freq: Every day | ORAL | Status: DC
  Administered 2022-01-27 – 2022-01-30 (×4): 20 mg via ORAL
  Filled 2022-01-26 (×4): qty 2

## 2022-01-26 MED ORDER — MIDODRINE HCL 5 MG PO TABS
10.0000 mg | ORAL_TABLET | Freq: Three times a day (TID) | ORAL | Status: DC
  Administered 2022-01-26 – 2022-01-30 (×12): 10 mg via ORAL
  Filled 2022-01-26 (×12): qty 2

## 2022-01-26 MED ORDER — THIAMINE HCL 100 MG PO TABS
100.0000 mg | ORAL_TABLET | Freq: Every day | ORAL | Status: DC
  Administered 2022-01-27 – 2022-01-30 (×4): 100 mg via ORAL
  Filled 2022-01-26 (×7): qty 1

## 2022-01-26 MED ORDER — DOCUSATE SODIUM 100 MG PO CAPS
100.0000 mg | ORAL_CAPSULE | Freq: Every day | ORAL | Status: DC
  Filled 2022-01-26: qty 1

## 2022-01-26 MED ORDER — PIPERACILLIN-TAZOBACTAM 3.375 G IVPB
3.3750 g | Freq: Three times a day (TID) | INTRAVENOUS | Status: DC
  Administered 2022-01-26 – 2022-01-27 (×3): 3.375 g via INTRAVENOUS
  Filled 2022-01-26 (×3): qty 50

## 2022-01-26 MED ORDER — ACETAMINOPHEN 325 MG PO TABS: 650.0000 mg | ORAL_TABLET | Freq: Four times a day (QID) | ORAL | Status: DC | PRN

## 2022-01-26 MED ORDER — INSULIN ASPART 100 UNIT/ML IJ SOLN
0.0000 [IU] | Freq: Three times a day (TID) | INTRAMUSCULAR | Status: DC
  Administered 2022-01-27: 1 [IU] via SUBCUTANEOUS
  Administered 2022-01-27: 2 [IU] via SUBCUTANEOUS
  Administered 2022-01-27: 4 [IU] via SUBCUTANEOUS
  Administered 2022-01-28: 3 [IU] via SUBCUTANEOUS
  Administered 2022-01-28: 4 [IU] via SUBCUTANEOUS
  Administered 2022-01-28: 5 [IU] via SUBCUTANEOUS
  Administered 2022-01-29 (×2): 3 [IU] via SUBCUTANEOUS
  Administered 2022-01-29: 5 [IU] via SUBCUTANEOUS
  Administered 2022-01-30: 4 [IU] via SUBCUTANEOUS
  Administered 2022-01-30: 2 [IU] via SUBCUTANEOUS
  Filled 2022-01-26: qty 0.06

## 2022-01-26 MED ORDER — SODIUM CHLORIDE 0.9 % IV SOLN: INTRAVENOUS | Status: AC

## 2022-01-26 MED ORDER — ACETAMINOPHEN 650 MG RE SUPP: 650.0000 mg | Freq: Four times a day (QID) | RECTAL | Status: DC | PRN

## 2022-01-26 MED ORDER — DRONABINOL 5 MG PO CAPS
5.0000 mg | ORAL_CAPSULE | Freq: Two times a day (BID) | ORAL | Status: DC
  Administered 2022-01-27 – 2022-01-30 (×6): 5 mg via ORAL
  Filled 2022-01-26 (×8): qty 1

## 2022-01-26 MED ORDER — MELATONIN 3 MG PO TABS: 3.0000 mg | ORAL_TABLET | Freq: Every evening | ORAL | Status: DC | PRN

## 2022-01-26 MED ORDER — SODIUM CHLORIDE 0.9 % IV BOLUS
500.0000 mL | Freq: Once | INTRAVENOUS | Status: AC
  Administered 2022-01-26: 500 mL via INTRAVENOUS

## 2022-01-26 MED ORDER — EZETIMIBE 10 MG PO TABS
10.0000 mg | ORAL_TABLET | Freq: Every day | ORAL | Status: DC
  Administered 2022-01-27 – 2022-01-30 (×4): 10 mg via ORAL
  Filled 2022-01-26 (×4): qty 1

## 2022-01-26 MED ORDER — GLUCERNA SHAKE PO LIQD
237.0000 mL | Freq: Three times a day (TID) | ORAL | Status: DC
  Administered 2022-01-27 – 2022-01-30 (×8): 237 mL via ORAL
  Filled 2022-01-26 (×14): qty 237

## 2022-01-26 MED ORDER — ASPIRIN 81 MG PO TBEC
81.0000 mg | DELAYED_RELEASE_TABLET | Freq: Every day | ORAL | Status: DC
  Administered 2022-01-27 – 2022-01-30 (×4): 81 mg via ORAL
  Filled 2022-01-26 (×4): qty 1

## 2022-01-26 MED ORDER — SODIUM CHLORIDE 0.9 % IV BOLUS
1000.0000 mL | Freq: Once | INTRAVENOUS | Status: AC
  Administered 2022-01-26: 1000 mL via INTRAVENOUS

## 2022-01-26 MED ORDER — POLYETHYLENE GLYCOL 3350 17 G PO PACK: 17.0000 g | PACK | Freq: Every day | ORAL | Status: DC | PRN

## 2022-01-26 NOTE — ED Provider Notes (Signed)
[facility] DEPT Provider Note   CSN: [id number] Arrival date & time: 01/26/22  1653     History  Chief Complaint  Patient presents with   Weakness    Charles [name] is a 61 y.o. male w/ hx of MDR recurring UTI's, indwelling foley catheters, presenting from [facility] with concern for fever and possible UTI.  Patient feels his urine is darker and possibly infected again.  He denies vomiting.  He has indwelling foley.  Bedside medication records from [facility] show he was treated with IM cepefime for UTI for 7 days beginning 01/06/22, which should be completed now.   HPI     Home Medications Prior to Admission medications   Medication Sig Start Date End Date Taking? Authorizing Provider  aspirin EC 81 MG tablet Take 1 tablet (81 mg total) by mouth daily. Swallow whole. 09/14/21  Yes Pokhrel, Laxman, MD  docusate sodium (COLACE) 100 MG capsule Take 100 mg by mouth at bedtime.   Yes [provider]  dronabinol (MARINOL) 5 MG capsule Take 5 mg by mouth 2 (two) times daily before a meal.   Yes [provider]  ezetimibe (ZETIA) 10 MG tablet Take 1 tablet (10 mg total) by mouth daily. 07/07/21  Yes Thurnell Lose, MD  Glucerna (GLUCERNA) LIQD Take 237 mLs by mouth 3 (three) times daily with meals.   Yes [provider]  hydrocortisone (ANUSOL-HC) 2.5 % rectal cream Place rectally 2 (two) times daily. 09/11/21  Yes Pokhrel, Laxman, MD  insulin lispro (HUMALOG) 100 UNIT/ML KwikPen Inject 2-10 Units into the skin See admin instructions. Inject 2-10 units into the skin daily with meals, PER SLIDING SCALE: BGL 140-199 = 2 units; 200-250 = 4 units; 251-299 = 6 units; 300-349 = 8 units; 350+ = 10 units; <70 or >350, CALL MD   Yes [provider]  Lido-PE-Glycerin-Petrolatum (PREPARATION H RAPID RELIEF) 5-0.25-14.4-15 % CREA Place 1 Application rectally every 6 (six) hours as needed (hemorrhoids).   Yes [provider]   loperamide (IMODIUM) 2 MG capsule Take 1 capsule (2 mg total) by mouth as needed for diarrhea or loose stools. 12/05/21  Yes Danford, Suann Larry, MD  magnesium oxide (MAG-OX) 400 (240 Mg) MG tablet Take 400 mg by mouth daily.   Yes [provider]  methenamine (HIPREX) 1 g tablet Take 1 g by mouth 2 (two) times daily with a meal.   Yes [provider]  midodrine (PROAMATINE) 10 MG tablet Take 1 tablet (10 mg total) by mouth 3 (three) times daily with meals. 07/07/21  Yes Thurnell Lose, MD  Multiple Vitamin (MULTIVITAMIN WITH MINERALS) TABS tablet Take 1 tablet by mouth daily. 09/01/21  Yes Hosie Poisson, MD  NON FORMULARY Take 118 mLs by mouth See admin instructions. Magic Cup desert cup- Eat 1 "cup" (118 ml's) by mouth 2 times a day- with lunch and supper   Yes [provider]  pantoprazole (PROTONIX) 40 MG tablet Take 1 tablet (40 mg total) by mouth daily. Any generic PPI is okay to dispense Patient taking differently: Take 40 mg by mouth daily before breakfast. 04/25/21  Yes Rai, Ripudeep K, MD  polyethylene glycol (MIRALAX / GLYCOLAX) 17 g packet Take 17 g by mouth daily. 09/11/21  Yes Pokhrel, Laxman, MD  rosuvastatin (CRESTOR) 20 MG tablet Take 20 mg by mouth daily.   Yes [provider]  thiamine 100 MG tablet Take 1 tablet (100 mg total) by mouth daily. 09/01/21  Yes Hosie Poisson, MD  blood glucose meter kit and supplies Dispense based on patient and insurance preference. Use up to four times daily as directed. (FOR ICD-10 E10.9, E11.9). 12/23/20   de [place], Blondell Reveal, MD  rosuvastatin (CRESTOR) 40 MG tablet Take 1 tablet (40 mg total) by mouth daily. Patient not taking: Reported on 01/26/2022 04/25/21   Mendel Corning, MD      Allergies    Patient has no known allergies.    Review of Systems   Review of Systems  Physical Exam Updated Vital Signs BP 98/61   Pulse 90   Temp 100.2 F (37.9 C) (Oral)   Resp 15   Ht _0  (1.702 m)   Wt 45.1 kg    SpO2 100%   BMI 15.57 kg/m  Physical Exam Constitutional:      General: He is not in acute distress.    Comments: Chronically ill appearing  HENT:     Head: Normocephalic and atraumatic.  Eyes:     Conjunctiva/sclera: Conjunctivae normal.     Pupils: Pupils are equal, round, and reactive to light.  Cardiovascular:     Rate and Rhythm: Normal rate and regular rhythm.  Pulmonary:     Effort: Pulmonary effort is normal. No respiratory distress.  Abdominal:     General: There is no distension.     Tenderness: There is no abdominal tenderness.  Genitourinary:    Comments: Indwelling foley Skin:    General: Skin is warm and dry.  Neurological:     General: No focal deficit present.     Mental Status: He is alert. Mental status is at baseline.  Psychiatric:        Mood and Affect: Mood normal.        Behavior: Behavior normal.     ED Results / Procedures / Treatments   Labs (all labs ordered are listed, but only abnormal results are displayed) Labs Reviewed  LACTIC ACID, PLASMA - Abnormal; Notable for the following components:      Result Value   Lactic Acid, Venous 2.5 (*)    All other components within normal limits  COMPREHENSIVE METABOLIC PANEL - Abnormal; Notable for the following components:   Glucose, Bld 193 (*)    Albumin 2.8 (*)    All other components within normal limits  CBC WITH DIFFERENTIAL/PLATELET - Abnormal; Notable for the following components:   WBC 13.1 (*)    RBC 3.77 (*)    Hemoglobin 10.5 (*)    HCT 32.6 (*)    RDW 15.7 (*)    Neutro Abs 11.1 (*)    All other components within normal limits  APTT - Abnormal; Notable for the following components:   aPTT 41 (*)    All other components within normal limits  URINALYSIS, ROUTINE W REFLEX MICROSCOPIC - Abnormal; Notable for the following components:   APPearance CLOUDY (*)    Glucose, UA >=500 (*)    Hgb urine dipstick SMALL (*)    Protein, ur 30 (*)    Nitrite POSITIVE (*)    Leukocytes,Ua  LARGE (*)    RBC / HPF >50 (*)    WBC, UA >50 (*)    Bacteria, UA MANY (*)    All other components within normal limits  RESP PANEL BY RT-PCR (FLU A&B, COVID) ARPGX2  CULTURE, BLOOD (ROUTINE X 2)  CULTURE, BLOOD (ROUTINE X 2)  URINE CULTURE  LACTIC ACID, PLASMA  PROTIME-INR  CBC WITH DIFFERENTIAL/PLATELET  COMPREHENSIVE METABOLIC  PANEL  MAGNESIUM  MAGNESIUM  PROCALCITONIN  TSH  HEMOGLOBIN A1C    EKG EKG Interpretation  Date/Time:  Thursday January 26 2022 20:30:11 EST Ventricular Rate:  88 PR Interval:  180 QRS Duration: 108 QT Interval:  361 QTC Calculation: 437 R Axis:   104 Text Interpretation: Sinus rhythm Probable lateral infarct, age indeterminate Anterior infarct, old Abnormal T, seen on prior tracings Confirmed by Octaviano Glow [phone]) on 01/26/2022 9:11:08 PM  Radiology DG Chest Port 1 View  Result Date: 01/26/2022 CLINICAL DATA:  Sepsis, weakness EXAM: PORTABLE CHEST 1 VIEW COMPARISON:  11/29/2021 FINDINGS: Single frontal view of the chest demonstrates an unremarkable cardiac silhouette. No airspace disease, effusion, or pneumothorax. Subacute healing left posterior fourth rib fracture. No acute bony abnormalities. IMPRESSION: 1. No acute intrathoracic process. Electronically Signed   By: Randa Ngo M.D.   On: 01/26/2022 17:53    Procedures .Critical Care  Performed by: Wyvonnia Dusky, MD Authorized by: Wyvonnia Dusky, MD   Critical care provider statement:    Critical care time (minutes):  45   Critical care time was exclusive of:  Separately billable procedures and treating other patients   Critical care was necessary to treat or prevent imminent or life-threatening deterioration of the following conditions:  Sepsis   Critical care was time spent personally by me on the following activities:  Ordering and performing treatments and interventions, ordering and review of laboratory studies, ordering and review of radiographic studies, pulse  oximetry, review of old charts, examination of patient and evaluation of patient's response to treatment     Medications Ordered in ED Medications  piperacillin-tazobactam (ZOSYN) IVPB 3.375 g (0 g Intravenous Stopped 01/26/22 2202)  acetaminophen (TYLENOL) tablet 650 mg (has no administration in time range)    Or  acetaminophen (TYLENOL) suppository 650 mg (has no administration in time range)  melatonin tablet 3 mg (has no administration in time range)  0.9 %  sodium chloride infusion (has no administration in time range)  midodrine (PROAMATINE) tablet 10 mg (has no administration in time range)  insulin aspart (novoLOG) injection 0-6 Units (has no administration in time range)  aspirin EC tablet 81 mg (has no administration in time range)  dronabinol (MARINOL) capsule 5 mg (has no administration in time range)  ezetimibe (ZETIA) tablet 10 mg (has no administration in time range)  feeding supplement (GLUCERNA 1.2 CAL) liquid 237 mL (has no administration in time range)  polyethylene glycol (MIRALAX / GLYCOLAX) packet 17 g (has no administration in time range)  docusate sodium (COLACE) capsule 100 mg (has no administration in time range)  rosuvastatin (CRESTOR) tablet 20 mg (has no administration in time range)  thiamine (VITAMIN B1) tablet 100 mg (has no administration in time range)  sodium chloride 0.9 % bolus 1,000 mL (0 mLs Intravenous Stopped 01/26/22 2112)  sodium chloride 0.9 % bolus 500 mL (0 mLs Intravenous Stopped 01/26/22 2112)    ED Course/ Medical Decision Making/ A&P Clinical Course as of 01/26/22 2237  Thu Jan 26, 2022  1726 I am unable to reach a staff member at [facility]  [MT]  [phone] Nursing staff here was able to reach [facility] staff and confirmed patient has not had antibiotics since 11/4 after completing IM cefepime.  Their staff could not confirm last time foley was exchanged.  He was discharged from the hospital 7 weeks ago. [MT]    Clinical Course User  Index [MT] Chares Slaymaker, Carola Rhine, MD  Medical Decision Making Amount and/or Complexity of Data Reviewed Labs: ordered. Radiology: ordered. ECG/medicine tests: ordered.  Risk Prescription drug management. Decision regarding hospitalization.   This patient presents to the ED with concern for fever, foul urine,. This involves an extensive number of treatment options, and is a complaint that carries with it a high risk of complications and morbidity.  The differential diagnosis includes urosepsis versus UTI versus viral illness or URI versus other  Co-morbidities that complicate the patient evaluation: History of multidrug-resistant UTIs and indwelling Foley at high risk of UTI  Additional history obtained from EMS  External records from outside source obtained and reviewed including medication records from [facility]  I ordered and personally interpreted labs.  The pertinent results include: Leukocytosis, lactate 2.5.  UA which is consistent with infection, although this may also be colonized that is a Foley sample.  Repeat lactate after fluids improved to 1.3, during sepsis reassessment.  I ordered imaging studies including x-ray of the chest I independently visualized and interpreted imaging which showed no focal infiltrate I agree with the radiologist interpretation  The patient was maintained on a cardiac monitor.  I personally viewed and interpreted the cardiac monitored which showed an underlying rhythm of: Sinus rhythm  Per my interpretation the patient's ECG shows sinus rhythm with chronic T wave inversions noted on prior tracings and inferior lateral leads.  No STEMI  I ordered medication including zosyn for BS antibiotics for MDR UTI/sepsis.  1.5 L fluid bolus for sepsis fluid bolus.  I have reviewed the patients home medicines and have made adjustments as needed  Test Considered: I do not see an indication for emergent CT imaging of the abdomen at  this time.   After the interventions noted above, I reevaluated the patient and found that they have: stayed the same    Dispostion:  After consideration of the diagnostic results and the patients response to treatment, I feel that the patent would benefit from medical admission.         Final Clinical Impression(s) / ED Diagnoses Final diagnoses:  Weakness  Acute cystitis without hematuria  Sepsis, due to unspecified organism, unspecified whether acute organ dysfunction present [facility])    Rx / DC Orders ED Discharge Orders          Ordered    Insert foley catheter        01/26/22 2108              Wyvonnia Dusky, MD 01/26/22 2237

## 2022-01-26 NOTE — H&P (Signed)
History and Physical      Charles [name] RWE:[id number] DOB: [birth year] DOA: 01/26/2022  PCP: Pcp, No (will further assess) Patient coming from: SNF  I have personally briefly reviewed patient's old medical records in [place]  Chief Complaint: fever  HPI: Charles [name] is a 61 y.o. male with medical history significant for  chronic indwelling Foley catheter, chronic diastolic heart failure, dementia, type 2 diabetes mellitus anemia of chronic disease with baseline hemoglobin 8 to 11, who is admitted to [facility] on 01/26/2022 with severe sepsis d/t UTI after presenting from SNF to [facility] ED  for evaluation of objective fever.    in the setting of the patient's altered mental status, the following history is provided by SNF staff, discussions with the EDP, and via chart review.   Patient with a reported history of mild dementia as well as chronic Foley catheter completed by a history of multidrug-resistant urinary tract infections.  earlier today, SNF staff noted the patient to develop new onset objective fever, reportedly around 101.  this was concomitant with 1 day of progressive confusion relative to his baseline mental status, acknowledging baseline mild dementia.  the patient is socially brought to [facility] emergency department for further evaluation management of the above.   The patient is currently without complaint, specifically denying any chest pain or shortness of breath.  Most recent reported urine culture from October 2023 reportedly prompted ED 1 week course of IM cefepime administered at bid frequency as an outpatient,  with final dose reported to have occurred on 01/14/22.  no report of any insulin of attics for acute urinary tract infection for following final doses of cefepime on 11 4 2423.  Of note, during the patient's history hospitalization, which occurred in September 2023, he underwent palliative care consultation for establishment of goals of care.  This  is a palliative care consult review, and per chart review was conducted with the  he did the patient's medical power of attorney, who is reportedly his sister.  sister  reportedlyconfirmed DNR/DNI status, and also conveyed that the patient would not want a feeding tube,  but that he will be amenable to additional  hospitalizations for acute medical care.       ED Course:  Vital signs in the ED were notable for the following:  temperature max 101; heart rate 86 and 97; systolic blood pressures in the 61P to 509T systolic and respiratory rate 15-26, O2 sats  Labs were notable for the following:  97 to 100% on room air.  CMP notable for the following creatinine 0.66, calcium, just for mild hypoalbuminemia noted to be 9.8, avidin 2.8, otherwise, liver enzymes within normal limits.  Initial lactate 2.5, with the value trending down to 1.3.  CBC notable for white blood cell count 13,100 with 85% neutrophils, hemoglobin 10.5 associated Neuraceq/normochromic properties and relative to most recent prior globin value of 8.5 on 9/25/20209/25/2023.  INR 1.0 patient just with the patient's chronic indwelling Foley catheter, urinalysis was collected today and associated with cloudy appearing specimen that was notable for 3 for blood cells, many bacteria, large leukocyte esterase, nitrate positive, and no squamous epithelial cells but cultures x2 and urine culture were collected prior to initiation of IV antibiotics. Covid/  Influenza PCR negative.   Per my interpretation, EKG in ED demonstrated the following:   in comparison to most recent prior EKG from 11/29/2021, presenting EKG shows sinus rhythm, heart rate 88, normal intervals, nonspecific T wave inversions in inferior/lateral  leads, similar in appearance to most recent EKG,  potentiallyslightly more pronoucned per today's EKG;  additionally, less than 1 mm to depression in the above also appears similar to most recent prior, and relative to most recent prior EKG,  disease shows nonspecific less than 1 L ST elevation limited to V3,  noting unchanged less than 1 mm ST elevation in V2.  Imaging and additional notable ED work-up:   Chest x-ray showed no evidence of acute cardiopulmonary process, including no evidence of infiltrate, edema, effusion, or pneumothorax.  While in the ED, the following were administered:   Following EDPs review of most recent prior urine culture/studies from late October 2023, the patient was started on Zosyn.  Also  Subsequently, the patient was admitted   due to 1.5 L normal saline bolus.  For further evaluate and management of suspected severe sepsis due to urinary tract infection, presentation also notable for acute metabolic encephalopathy the superimposed on baseline mild dementia.   Review of Systems: As per HPI otherwise 10 point review of systems negative.   Past Medical History:  Diagnosis Date   Arthritis    Diabetes mellitus without complication ([place])    Heart disease    Hypertension     Past Surgical History:  Procedure Laterality Date   COLONOSCOPY WITH PROPOFOL N/A 08/21/2021   Procedure: COLONOSCOPY WITH PROPOFOL;  Surgeon: Daryel November, MD;  Location: [facility] ENDOSCOPY;  Service: Gastroenterology;  Laterality: N/A;   CORONARY ARTERY BYPASS GRAFT     ESOPHAGOGASTRODUODENOSCOPY (EGD) WITH PROPOFOL N/A 08/21/2021   Procedure: ESOPHAGOGASTRODUODENOSCOPY (EGD) WITH PROPOFOL;  Surgeon: Daryel November, MD;  Location: [place];  Service: Gastroenterology;  Laterality: N/A;   POLYPECTOMY  08/21/2021   Procedure: POLYPECTOMY;  Surgeon: Daryel November, MD;  Location: [facility] ENDOSCOPY;  Service: Gastroenterology;;    Social History:  reports that he has never smoked. He has never used smokeless tobacco. He reports that he does not currently use alcohol. He reports that he does not use drugs.   No Known Allergies  Family History  Problem Relation Age of Onset   Diabetes Mother    Hypertension Mother     Diabetes Father    Hypertension Father    Heart attack Father    Diabetes Sister    Diabetes Sister     Family history reviewed and not pertinent    Prior to Admission medications   Medication Sig Start Date End Date Taking? Authorizing Provider  aspirin EC 81 MG tablet Take 1 tablet (81 mg total) by mouth daily. Swallow whole. 09/14/21  Yes Pokhrel, Laxman, MD  docusate sodium (COLACE) 100 MG capsule Take 100 mg by mouth at bedtime.   Yes [provider]  dronabinol (MARINOL) 5 MG capsule Take 5 mg by mouth 2 (two) times daily before a meal.   Yes [provider]  ezetimibe (ZETIA) 10 MG tablet Take 1 tablet (10 mg total) by mouth daily. 07/07/21  Yes Thurnell Lose, MD  Glucerna (GLUCERNA) LIQD Take 237 mLs by mouth 3 (three) times daily with meals.   Yes [provider]  hydrocortisone (ANUSOL-HC) 2.5 % rectal cream Place rectally 2 (two) times daily. 09/11/21  Yes Pokhrel, Laxman, MD  insulin lispro (HUMALOG) 100 UNIT/ML KwikPen Inject 2-10 Units into the skin See admin instructions. Inject 2-10 units into the skin daily with meals, PER SLIDING SCALE: BGL 140-199 = 2 units; 200-250 = 4 units; 251-299 = 6 units; 300-349 = 8 units; 350+ = 10  units; <70 or >350, CALL MD   Yes [provider]  Lido-PE-Glycerin-Petrolatum (PREPARATION H RAPID RELIEF) 5-0.25-14.4-15 % CREA Place 1 Application rectally every 6 (six) hours as needed (hemorrhoids).   Yes [provider]  loperamide (IMODIUM) 2 MG capsule Take 1 capsule (2 mg total) by mouth as needed for diarrhea or loose stools. 12/05/21  Yes Danford, Suann Larry, MD  magnesium oxide (MAG-OX) 400 (240 Mg) MG tablet Take 400 mg by mouth daily.   Yes [provider]  methenamine (HIPREX) 1 g tablet Take 1 g by mouth 2 (two) times daily with a meal.   Yes [provider]  midodrine (PROAMATINE) 10 MG tablet Take 1 tablet (10 mg total) by mouth 3 (three) times daily with meals. 07/07/21   Yes Thurnell Lose, MD  Multiple Vitamin (MULTIVITAMIN WITH MINERALS) TABS tablet Take 1 tablet by mouth daily. 09/01/21  Yes Hosie Poisson, MD  NON FORMULARY Take 118 mLs by mouth See admin instructions. Magic Cup desert cup- Eat 1 "cup" (118 ml's) by mouth 2 times a day- with lunch and supper   Yes [provider]  pantoprazole (PROTONIX) 40 MG tablet Take 1 tablet (40 mg total) by mouth daily. Any generic PPI is okay to dispense Patient taking differently: Take 40 mg by mouth daily before breakfast. 04/25/21  Yes Rai, Ripudeep K, MD  polyethylene glycol (MIRALAX / GLYCOLAX) 17 g packet Take 17 g by mouth daily. 09/11/21  Yes Pokhrel, Laxman, MD  rosuvastatin (CRESTOR) 20 MG tablet Take 20 mg by mouth daily.   Yes [provider]  thiamine 100 MG tablet Take 1 tablet (100 mg total) by mouth daily. 09/01/21  Yes Hosie Poisson, MD  blood glucose meter kit and supplies Dispense based on patient and insurance preference. Use up to four times daily as directed. (FOR ICD-10 E10.9, E11.9). 12/23/20   de [place], Blondell Reveal, MD  rosuvastatin (CRESTOR) 40 MG tablet Take 1 tablet (40 mg total) by mouth daily. Patient not taking: Reported on 01/26/2022 04/25/21   Mendel Corning, MD     Objective    Physical Exam: Vitals:   01/26/22 1900 01/26/22 2030 01/26/22 2100 01/26/22 2159  BP: (!) 125/52 113/74 (!) 99/54 98/61  Pulse: 96 87 86 90  Resp: 18 15 (!) 26 15  Temp:    100.2 F (37.9 C)  TempSrc:    Oral  SpO2: 100% 97% 98% 100%  Weight:      Height:        General: appears to be stated age; alert,   Confused Skin: warm, dry, no rash Head:  AT/[place] Mouth:  Oral mucosa membranes appear  dry, normal dentition Neck: supple; trachea midline Heart:  RRR; did not appreciate any M/R/G Lungs: CTAB, did not appreciate any wheezes, rales, or rhonchi Abdomen: + BS; soft, ND, NT Vascular: 2+ pedal pulses b/l; 2+ radial pulses b/l Extremities: no peripheral edema, no muscle  wasting Neuro: strength and sensation intact in upper and lower extremities b/l      Labs on Admission: I have personally reviewed following labs and imaging studies  CBC: Recent Labs  Lab 01/26/22 1718  WBC 13.1*  NEUTROABS 11.1*  HGB 10.5*  HCT 32.6*  MCV 86.5  PLT 540   Basic Metabolic Panel: Recent Labs  Lab 01/26/22 1718  NA 135  K 4.4  CL 100  CO2 28  GLUCOSE 193*  BUN 10  CREATININE 0.66  CALCIUM 8.9   GFR: Estimated  Creatinine Clearance: 64.2 mL/min (by C-G formula based on SCr of 0.66 mg/dL). Liver Function Tests: Recent Labs  Lab 01/26/22 1718  AST 20  ALT 26  ALKPHOS 99  BILITOT 0.5  PROT 7.9  ALBUMIN 2.8*   No results for input(s): "LIPASE", "AMYLASE" in the last 168 hours. No results for input(s): "AMMONIA" in the last 168 hours. Coagulation Profile: Recent Labs  Lab 01/26/22 1718  INR 1.0   Cardiac Enzymes: No results for input(s): "CKTOTAL", "CKMB", "CKMBINDEX", "TROPONINI" in the last 168 hours. BNP (last 3 results) No results for input(s): "PROBNP" in the last 8760 hours. HbA1C: No results for input(s): "HGBA1C" in the last 72 hours. CBG: No results for input(s): "GLUCAP" in the last 168 hours. Lipid Profile: No results for input(s): "CHOL", "HDL", "LDLCALC", "TRIG", "CHOLHDL", "LDLDIRECT" in the last 72 hours. Thyroid Function Tests: No results for input(s): "TSH", "T4TOTAL", "FREET4", "T3FREE", "THYROIDAB" in the last 72 hours. Anemia Panel: No results for input(s): "VITAMINB12", "FOLATE", "FERRITIN", "TIBC", "IRON", "RETICCTPCT" in the last 72 hours. Urine analysis:    Component Value Date/Time   COLORURINE YELLOW 01/26/2022 2031   APPEARANCEUR CLOUDY (A) 01/26/2022 2031   APPEARANCEUR Hazy (A) 05/09/2021 1006   LABSPEC 1.015 01/26/2022 2031   PHURINE 8.0 01/26/2022 2031   GLUCOSEU >=500 (A) 01/26/2022 2031   HGBUR SMALL (A) 01/26/2022 2031   BILIRUBINUR NEGATIVE 01/26/2022 2031   BILIRUBINUR Negative 05/09/2021 [place] 01/26/2022 2031   PROTEINUR 30 (A) 01/26/2022 2031   NITRITE POSITIVE (A) 01/26/2022 2031   LEUKOCYTESUR LARGE (A) 01/26/2022 2031    Radiological Exams on Admission: DG Chest Port 1 View  Result Date: 01/26/2022 CLINICAL DATA:  Sepsis, weakness EXAM: PORTABLE CHEST 1 VIEW COMPARISON:  11/29/2021 FINDINGS: Single frontal view of the chest demonstrates an unremarkable cardiac silhouette. No airspace disease, effusion, or pneumothorax. Subacute healing left posterior fourth rib fracture. No acute bony abnormalities. IMPRESSION: 1. No acute intrathoracic process. Electronically Signed   By: Randa Ngo M.D.   On: 01/26/2022 17:53      Assessment/Plan    Principal Problem:   UTI (urinary tract infection) Active Problems:   Acute metabolic encephalopathy   DM2 (diabetes mellitus, type 2) (HCC)   HLD (hyperlipidemia)   Severe sepsis (HCC)   Anemia of chronic disease   Chronic diastolic heart failure (HCC)   History of orthostatic hypotension      #) Severe sepsis due to acute cystitis: Suspected diagnosis in the setting of 1 day of new onset objective fever, concomitant with worsening of confusion relative to baseline mental status, with the symptoms similar to that which the patient has experienced at times of previous urinary tract infections, noting that he does have a chronic indwelling Foley catheter with a history of recurrent UTIs, including multidrug-resistant UTI's.  Following Foley catheter exchanged in the ED, so urinalysis appears consistent with urinary tract infection, noting significant pyuria, many bacteria, large leukocyte esterase, nitrate positive, and no evidence of squamous epithelial cells to suggest a contaminated specimen.  Given these findings on UA in tandem with clinical features similar to those at times of previous urinary tract infections, including objective fever, will pursue IV antibiotics for suspected UTI.   SIRS criteria met via  objective fever, leukocytosis, tachycardia. Lactic acid level: Initially 2.5, with repeat value trending down to 1.3, following 30 mL/kg IVF bolus. Of note, given the associated presence of suspected end organ damage in the form of concominant presenting elevated lactate along with  presenting acute metabolic encephalopathy superimposed on baseline dementia, criteria are met for pt's sepsis to be considered severe in nature.  No evidence of objective hypotension at this time.  Following collection of blood cultures x2 as well as urine culture in the ED today, patient was started on Zosyn on the basis of results of urine culture/sensitivities from the second half of October 2023, while also acknowledging recently completed 1 week course of IM cefepime as an outpatient, as above.  Consequently, we will continue Zosyn, and closely monitor for results of urine culture/sensitivity to further adjust antibiotic intervention.  No e/o additional infectious process at this time, including chest x-ray, which showed no evidence of acute cardiopulmonary process, including no evidence of infiltrate, while COVID-19/influenza PCR were found to be negative.  Given that the patient does appear to be slightly dehydrated, increasing risk for false negative finding on presenting chest x-ray, will also add on procalcitonin level.   Plan: CBC w/ diff and CMP in AM.  Follow for results of blood cx's x 2 as well as urine culture. Abx: Continue Zosyn, as above.  Continuous NS at 125 cc/h x 8 hours.  Add on procalcitonin level.  Prn acetaminophen for fever.  Monitor on telemetry.                #) Acute metabolic encephalopathy: Per SNF staff, the patient has exhibited evidence of 1 day of progressive confusion relative to his baseline mild dementia, with his acute worsening of confusion appearing to be on the basis of physiologic stressors stemming from presenting severe sepsis due to UTI, as above.  No obvious  additional contributory underlying infectious process at this time.   No additional overt metabolic/electrolyte contributions at this time.  No obvious contributory pharmacologic factors. No overt acute focal neurologic deficits to suggest a contribution from an underlying acute CVA. Seizures are also felt to be less likely. .      Plan: fall precautions. Repeat CMP/CBC in the AM. Check magnesium level. check TSH.  Further evaluation management of presenting severe sepsis due to UTI, as above.  Add on procalcitonin level.  Delirium precautions ordered.  Fall precautions.               #) Chronic diastolic heart failure: documented history of such, with most recent echocardiogram performed in January 2023, which is notable for LVEF 60 to 65%, no focal motion abnormalities, grade 1 diastolic dysfunction, and normal right ventricular systolic function. No clinical or radiographic evidence to suggest acutely decompensated heart failure at this time. home diuretic regimen reportedly consists of the following: None.  Clinically, the patient appears mildly intravascularly dry, particular in the context of worsening severe sepsis.  Will provide additional IV fluids overnight, will closely monitoring for ensuing evidence to suggest development of acute volume overload, as further detailed below.  Plan: monitor strict I's & O's and daily weights. Repeat CMP in AM. Check serum mag level.  Monitor on telemetry.           #) Type 2 Diabetes Mellitus: documented history of such. Home insulin regimen: Humalog sliding scale 3 times daily with meals. Home oral hypoglycemic agents: None. presenting blood sugar: 193.  Appears to be poorly controlled as an outpatient, with most recent A1c noted to be 10.4% in April 2023, potentially posing an additional risk factor for recurrent infections.  Plan: accuchecks QAC and HS with low dose SSI.  Add on hemoglobin A1c.            #)  Hyperlipidemia: documented h/o such. On high intensity rosuvastatin as outpatient.   Plan: continue home statin.            #) History of orthostatic hypotension: Documented history of such, on scheduled midodrine as an outpatient.  Potentially autonomic in nature in the setting of a history of poorly controlled type 2 diabetes mellitus.   Plan: Resume home midodrine, first dose now.  Fall precautions.  Monitor strict I's and O's and daily weights.          #) Anemia of chronic disease: Documented history of such, a/w with baseline hgb range 8-11, with presenting hgb consistent with this range, in the absence of any overt evidence of active bleed.  Additionally, presenting INR nonelevated.   Plan: Repeat CBC in the morning.        DVT prophylaxis: SCD's   Code Status: DNR/DNI (as confirmed  per formal palliative care consult 12/04/2021, during most recent prior hospitalization, as  further detailed above).  Family Communication: none Disposition Plan: Per Rounding Team Consults called: none;  Admission status: inpatient;     Rhetta Mura DO [facility] [place]   01/26/2022, 10:00 PM

## 2022-01-26 NOTE — ED Notes (Signed)
Patient had feces in his diaper , patient cleaned and foley changed with Techs's assistance.  Patient states she feels much better .

## 2022-01-26 NOTE — ED Triage Notes (Signed)
Pt bib ems from [facility] c/o weakness and recent uti.

## 2022-01-27 DIAGNOSIS — R6520 Severe sepsis without septic shock: Secondary | ICD-10-CM | POA: Diagnosis not present

## 2022-01-27 DIAGNOSIS — A419 Sepsis, unspecified organism: Secondary | ICD-10-CM | POA: Diagnosis not present

## 2022-01-27 DIAGNOSIS — G9341 Metabolic encephalopathy: Secondary | ICD-10-CM | POA: Diagnosis not present

## 2022-01-27 LAB — BLOOD CULTURE ID PANEL (REFLEXED) - BCID2
A.calcoaceticus-baumannii: NOT DETECTED
Bacteroides fragilis: NOT DETECTED
CTX-M ESBL: DETECTED — AB
Candida albicans: NOT DETECTED
Candida auris: NOT DETECTED
Candida glabrata: NOT DETECTED
Candida krusei: NOT DETECTED
Candida parapsilosis: NOT DETECTED
Candida tropicalis: NOT DETECTED
Carbapenem resist OXA 48 LIKE: NOT DETECTED
Carbapenem resistance IMP: NOT DETECTED
Carbapenem resistance KPC: NOT DETECTED
Carbapenem resistance NDM: NOT DETECTED
Carbapenem resistance VIM: NOT DETECTED
Cryptococcus neoformans/gattii: NOT DETECTED
Enterobacter cloacae complex: NOT DETECTED
Enterobacterales: DETECTED — AB
Enterococcus Faecium: NOT DETECTED
Enterococcus faecalis: NOT DETECTED
Escherichia coli: NOT DETECTED
Haemophilus influenzae: NOT DETECTED
Klebsiella aerogenes: NOT DETECTED
Klebsiella oxytoca: NOT DETECTED
Klebsiella pneumoniae: NOT DETECTED
Listeria monocytogenes: NOT DETECTED
Neisseria meningitidis: NOT DETECTED
Proteus species: NOT DETECTED
Pseudomonas aeruginosa: NOT DETECTED
Salmonella species: NOT DETECTED
Serratia marcescens: NOT DETECTED
Staphylococcus aureus (BCID): NOT DETECTED
Staphylococcus epidermidis: NOT DETECTED
Staphylococcus lugdunensis: NOT DETECTED
Staphylococcus species: NOT DETECTED
Stenotrophomonas maltophilia: NOT DETECTED
Streptococcus agalactiae: NOT DETECTED
Streptococcus pneumoniae: NOT DETECTED
Streptococcus pyogenes: NOT DETECTED
Streptococcus species: NOT DETECTED

## 2022-01-27 LAB — CBC WITH DIFFERENTIAL/PLATELET
Abs Immature Granulocytes: 0.03 10*3/uL (ref 0.00–0.07)
Basophils Absolute: 0.1 10*3/uL (ref 0.0–0.1)
Basophils Relative: 1 %
Eosinophils Absolute: 0.1 10*3/uL (ref 0.0–0.5)
Eosinophils Relative: 1 %
HCT: 29.1 % — ABNORMAL LOW (ref 39.0–52.0)
Hemoglobin: 9.4 g/dL — ABNORMAL LOW (ref 13.0–17.0)
Immature Granulocytes: 0 %
Lymphocytes Relative: 12 %
Lymphs Abs: 1.3 10*3/uL (ref 0.7–4.0)
MCH: 27.8 pg (ref 26.0–34.0)
MCHC: 32.3 g/dL (ref 30.0–36.0)
MCV: 86.1 fL (ref 80.0–100.0)
Monocytes Absolute: 1.2 10*3/uL — ABNORMAL HIGH (ref 0.1–1.0)
Monocytes Relative: 11 %
Neutro Abs: 8.2 10*3/uL — ABNORMAL HIGH (ref 1.7–7.7)
Neutrophils Relative %: 75 %
Platelets: 236 10*3/uL (ref 150–400)
RBC: 3.38 MIL/uL — ABNORMAL LOW (ref 4.22–5.81)
RDW: 15.7 % — ABNORMAL HIGH (ref 11.5–15.5)
WBC: 10.9 10*3/uL — ABNORMAL HIGH (ref 4.0–10.5)
nRBC: 0 % (ref 0.0–0.2)

## 2022-01-27 LAB — GLUCOSE, CAPILLARY
Glucose-Capillary: 220 mg/dL — ABNORMAL HIGH (ref 70–99)
Glucose-Capillary: 185 mg/dL — ABNORMAL HIGH (ref 70–99)
Glucose-Capillary: 231 mg/dL — ABNORMAL HIGH (ref 70–99)
Glucose-Capillary: 326 mg/dL — ABNORMAL HIGH (ref 70–99)
Glucose-Capillary: 230 mg/dL — ABNORMAL HIGH (ref 70–99)

## 2022-01-27 LAB — MRSA NEXT GEN BY PCR, NASAL: MRSA by PCR Next Gen: DETECTED — AB

## 2022-01-27 LAB — COMPREHENSIVE METABOLIC PANEL
ALT: 24 U/L (ref 0–44)
AST: 27 U/L (ref 15–41)
Albumin: 2.4 g/dL — ABNORMAL LOW (ref 3.5–5.0)
Alkaline Phosphatase: 82 U/L (ref 38–126)
Anion gap: 4 — ABNORMAL LOW (ref 5–15)
BUN: 9 mg/dL (ref 6–20)
CO2: 27 mmol/L (ref 22–32)
Calcium: 8.5 mg/dL — ABNORMAL LOW (ref 8.9–10.3)
Chloride: 102 mmol/L (ref 98–111)
Creatinine, Ser: 0.66 mg/dL (ref 0.61–1.24)
GFR, Estimated: >60 mL/min (ref >60)
Glucose, Bld: 214 mg/dL — ABNORMAL HIGH (ref 70–99)
Potassium: 3.9 mmol/L (ref 3.5–5.1)
Sodium: 133 mmol/L — ABNORMAL LOW (ref 135–145)
Total Bilirubin: 0.4 mg/dL (ref 0.3–1.2)
Total Protein: 6.7 g/dL (ref 6.5–8.1)

## 2022-01-27 LAB — HEMOGLOBIN A1C
Hgb A1c MFr Bld: 7.7 % — ABNORMAL HIGH (ref 4.8–5.6)
Mean Plasma Glucose: 174.29 mg/dL

## 2022-01-27 LAB — TSH: TSH: 2.84 u[IU]/mL (ref 0.350–4.500)

## 2022-01-27 LAB — MAGNESIUM
Magnesium: 1.6 mg/dL — ABNORMAL LOW (ref 1.7–2.4)
Magnesium: 1.7 mg/dL (ref 1.7–2.4)

## 2022-01-27 LAB — PROCALCITONIN: Procalcitonin: 0.24 ng/mL

## 2022-01-27 MED ORDER — SODIUM CHLORIDE 0.9 % IV SOLN
1.0000 g | Freq: Three times a day (TID) | INTRAVENOUS | Status: DC
  Administered 2022-01-27 – 2022-01-30 (×9): 1 g via INTRAVENOUS
  Filled 2022-01-27 (×10): qty 20

## 2022-01-27 NOTE — Progress Notes (Signed)
PHARMACY - PHYSICIAN COMMUNICATION CRITICAL VALUE ALERT - BLOOD CULTURE IDENTIFICATION (BCID)  Charles [name] is an 61 y.o. male with indwelling foley cath and hx recurrent UTI who presented to [facility] on 01/26/2022 with a chief complaint of generalized weakness and fever. He was started on zosyn on admission for empiric coverage for sepsis.   1 of 4 bottles collected on 01/26/22 has GNR (BCID= enterobacterales, CTX-M RSBL dectected)  Name of physician (or Provider) Contacted: Dr. Jerral Ralph  Current antibiotics: zosyn   Changes to prescribed antibiotics recommended:  - change abx to meropem 1gm q8h  Results for orders placed or performed during the hospital encounter of 01/26/22  Blood Culture ID Panel (Reflexed) (Collected: 01/26/2022  5:18 PM)  Result Value Ref Range   Enterococcus faecalis NOT DETECTED NOT DETECTED   Enterococcus Faecium NOT DETECTED NOT DETECTED   Listeria monocytogenes NOT DETECTED NOT DETECTED   Staphylococcus species NOT DETECTED NOT DETECTED   Staphylococcus aureus (BCID) NOT DETECTED NOT DETECTED   Staphylococcus epidermidis NOT DETECTED NOT DETECTED   Staphylococcus lugdunensis NOT DETECTED NOT DETECTED   Streptococcus species NOT DETECTED NOT DETECTED   Streptococcus agalactiae NOT DETECTED NOT DETECTED   Streptococcus pneumoniae NOT DETECTED NOT DETECTED   Streptococcus pyogenes NOT DETECTED NOT DETECTED   A.calcoaceticus-baumannii NOT DETECTED NOT DETECTED   Bacteroides fragilis NOT DETECTED NOT DETECTED   Enterobacterales DETECTED (A) NOT DETECTED   Enterobacter cloacae complex NOT DETECTED NOT DETECTED   Escherichia coli NOT DETECTED NOT DETECTED   Klebsiella aerogenes NOT DETECTED NOT DETECTED   Klebsiella oxytoca NOT DETECTED NOT DETECTED   Klebsiella pneumoniae NOT DETECTED NOT DETECTED   Proteus species NOT DETECTED NOT DETECTED   Salmonella species NOT DETECTED NOT DETECTED   Serratia marcescens NOT DETECTED NOT DETECTED   Haemophilus  influenzae NOT DETECTED NOT DETECTED   Neisseria meningitidis NOT DETECTED NOT DETECTED   Pseudomonas aeruginosa NOT DETECTED NOT DETECTED   Stenotrophomonas maltophilia NOT DETECTED NOT DETECTED   Candida albicans NOT DETECTED NOT DETECTED   Candida auris NOT DETECTED NOT DETECTED   Candida glabrata NOT DETECTED NOT DETECTED   Candida krusei NOT DETECTED NOT DETECTED   Candida parapsilosis NOT DETECTED NOT DETECTED   Candida tropicalis NOT DETECTED NOT DETECTED   Cryptococcus neoformans/gattii NOT DETECTED NOT DETECTED   CTX-M ESBL DETECTED (A) NOT DETECTED   Carbapenem resistance IMP NOT DETECTED NOT DETECTED   Carbapenem resistance KPC NOT DETECTED NOT DETECTED   Carbapenem resistance NDM NOT DETECTED NOT DETECTED   Carbapenem resist OXA 48 LIKE NOT DETECTED NOT DETECTED   Carbapenem resistance VIM NOT DETECTED NOT DETECTED    [name], [name] 01/27/2022  3:13 PM

## 2022-01-27 NOTE — Progress Notes (Signed)
Initial Nutrition Assessment  DOCUMENTATION CODES:   Severe malnutrition in context of chronic illness  INTERVENTION:   Glucerna Shake po TID, each supplement provides 220 kcal and 10 grams of protein - called and spoke with Pharmacy who will send supplements to unit  Pt preferred DM type formula but would consider changing to ensure if needed for additional kcal/protein   Magic cup TID with meals, each supplement provides 290 kcal and 9 grams of protein  Encouraged pt to order food preferences  Recommend treating any high blood sugars instead of restricting diet.   NUTRITION DIAGNOSIS:   Severe Malnutrition related to chronic illness as evidenced by severe muscle depletion, severe fat depletion.  GOAL:   Patient will meet greater than or equal to 90% of their needs  MONITOR:   PO intake, Supplement acceptance  REASON FOR ASSESSMENT:   Malnutrition Screening Tool    ASSESSMENT:   Pt resides at SNF with PMH of mild dementia chronic indwelling Foley catheter, CHF, DM, anemia of chronic disease admitted with severe sepsis due to UTI. Per Palliative care pt is a DNR/DNI and does not want a feeding tube now or in the future but would accept acute medical care.   Spoke with pt who is able to provide hx. Per pt he does not have any family in the area. Currently resides in SNF for the last 6 months and does not like the food choices there and does not eat much due to this. He reports appetite is good with good intake here. He has been able to order his meals and feed himself. Reports he lost 80 lb over a year and has been unable to regain any weight. His usual weight is 160-165 lb and thinks he is down to around 80 lb now. He is unable to walk but was able to move his right leg during exam.  Encouraged pt to speak with RD at SNF to help him find foods he can tolerate.   Medications reviewed and include: colace, marinol, SSI, thiamine   Labs reviewed: Na 133 A1C: 7.7 CBG's: 185    NUTRITION - FOCUSED PHYSICAL EXAM:  Flowsheet Row Most Recent Value  Orbital Region Severe depletion  Upper Arm Region Severe depletion  Thoracic and Lumbar Region Severe depletion  Buccal Region Severe depletion  Temple Region Severe depletion  Clavicle Bone Region Severe depletion  Clavicle and Acromion Bone Region Severe depletion  Scapular Bone Region Severe depletion  Dorsal Hand Severe depletion  Patellar Region Severe depletion  Anterior Thigh Region Severe depletion  Posterior Calf Region Severe depletion  Edema (RD Assessment) None  Hair Reviewed  Eyes Reviewed  Mouth Reviewed  Skin Reviewed  Nails Reviewed       Diet Order:   Diet Order             Diet regular Room service appropriate? Yes; Fluid consistency: Thin  Diet effective now                   EDUCATION NEEDS:   Education needs have been addressed  Skin:  Skin Assessment: Reviewed RN Assessment (abrasions)  Last BM:  11/16  Height:   Ht Readings from Last 1 Encounters:  01/26/22 5\' 7"  (1.702 m)    Weight:   Wt Readings from Last 1 Encounters:  01/27/22 41 kg    BMI:  Body mass index is 14.16 kg/m.  Estimated Nutritional Needs:   Kcal:  1400-600  Protein:  70-90 grams  Fluid:  >  1.5 L/day   Cammy Copa., RD, LDN, CNSC See AMiON for contact information

## 2022-01-27 NOTE — TOC Initial Note (Addendum)
Transition of Care [facility]) - Initial/Assessment Note    Patient Details  Name: Charles [name] MRN: [id number] Date of Birth: 02-14-1961  Transition [facility]) CM/SW Contact:    Vassie Moselle, LCSW Phone Number: 01/27/2022, 10:37 AM  Clinical Narrative:                 Pt coming from [facility] where he is a LTC resident at their SNF. Confirmed pt is able to return at discharge.  Pt was referred to [facility] for palliative care services on last hospital admission. CSW has contacted liaison to confirm services are still active.  CSW spoke with pt's sister, Debby Bud, who shares she lives in [place] and is an hour behind [place] so she may not answer the phone depending on the time she is called. She confirms pt is from [facility] and wants him to return at discharge.    Update 2:40pm- Per [facility] pt was unable to be established with their palliative care services due to not being able to obtain an order from MD at [facility] for services. CSW has contacted Scientist, physiological at [facility] for assistance with obtaining this order.   Update 3:10pm- Per [place] pt declined palliative care services and this is why no order was placed. CSW met with pt to confirm. Pt shares he is not satisfied with [name] and plans to move to [place] to be closer to family. Pt agreed that he does not want palliative care services until after he is able to move to [facility].   Expected Discharge Plan: [place] Barriers to Discharge: Continued Medical Work up   Patient Goals and CMS Choice Patient states their goals for this hospitalization and ongoing recovery are:: For pt to return to [employer].gov Compare Post Acute Care list provided to:: Patient Represenative (must comment) (Sister) Choice offered to / list presented to : Sibling  Expected Discharge Plan and Services Expected Discharge Plan: [place] In-house Referral: NA Discharge Planning Services:  NA Post Acute Care Choice: Resumption of Svcs/PTA Provider, [place] arrangements for the past 2 months: [place]                 DME Arranged: N/A DME Agency: NA                  Prior Living Arrangements/Services Living arrangements for the past 2 months: [place], [place] Lives with:: Facility Resident Patient language and need for interpreter reviewed:: Yes Do you feel safe going back to the place where you live?: Yes      Need for Family Participation in Patient Care: Yes (Comment) Care giver support system in place?: Yes (comment) Current home services: DME, Other (comment) (Palliative Care) Criminal Activity/Legal Involvement Pertinent to Current Situation/Hospitalization: No - Comment as needed  Activities of Daily Living Home Assistive Devices/Equipment: Bedside commode/3-in-1 ADL Screening (condition at time of admission) Patient's cognitive ability adequate to safely complete daily activities?: Yes Is the patient deaf or have difficulty hearing?: No Does the patient have difficulty seeing, even when wearing glasses/contacts?: No Does the patient have difficulty concentrating, remembering, or making decisions?: No Patient able to express need for assistance with ADLs?: Yes Does the patient have difficulty dressing or bathing?: No Independently performs ADLs?: No Communication: Independent Dressing (OT): Independent Grooming: Independent Feeding: Independent Bathing: Independent Toileting: Needs assistance Is this a change from baseline?: Pre-admission baseline In/Out Bed:  Needs assistance Is this a change from baseline?: Pre-admission baseline Walks in Home: Needs assistance Is this a change from baseline?: Pre-admission baseline Does the patient have difficulty walking or climbing stairs?: Yes Weakness of Legs: Both Weakness of Arms/Hands:  Both  Permission Sought/Granted Permission sought to share information with : Family Supports, Chartered certified accountant granted to share information with : No              Emotional Assessment   Attitude/Demeanor/Rapport: Unable to Assess Affect (typically observed): Unable to Assess Orientation: : Oriented to Self Alcohol / Substance Use: Not Applicable Psych Involvement: No (comment)  Admission diagnosis:  UTI (urinary tract infection) [N39.0] Weakness [R53.1] Acute cystitis without hematuria [N30.00] Sepsis, due to unspecified organism, unspecified whether acute organ dysfunction present [facility]) [A41.9] Patient Active Problem List   Diagnosis Date Noted   UTI (urinary tract infection) 01/26/2022   Chronic diastolic heart failure ([place]) 01/26/2022   History of orthostatic hypotension 01/26/2022   Dementia ([place]) 11/30/2021   Pressure injury of sacral region, stage 2 ([place]) 11/30/2021   Multiple fractures of ribs, left side, initial encounter for closed fracture 11/29/2021   Failure to thrive in adult 11/29/2021   Anemia of chronic disease 09/10/2021   Catheter-associated urinary tract infection ([place]) 08/27/2021   Goals of care, counseling/discussion 08/26/2021   Delirium 08/23/2021   Confusion 08/23/2021   Unintentional weight loss of more than 10% body weight within 6 months 08/21/2021   Hemorrhoids 08/21/2021   Diverticulosis of colon 08/21/2021   Solitary rectal ulcer 08/21/2021   Polyp of ascending colon 08/21/2021   Hypokalemia 08/21/2021   Acute blood loss anemia 08/20/2021   Chronic orthostatic hypotension 08/20/2021   Protein-calorie malnutrition, severe ([place]) 08/20/2021   Hypomagnesemia 08/20/2021   Physical deconditioning 08/20/2021   Acute lower GI bleeding 08/19/2021   Pressure injury of skin 07/02/2021   Severe sepsis ([place]) 06/30/2021   Hypothermia 06/30/2021   Urinary retention 04/23/2021   Acute prerenal azotemia 04/22/2021    Leukocytosis 04/22/2021   HLD (hyperlipidemia) 78/29/5621   Acute metabolic encephalopathy 30/86/5784   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/23/2021   DM2 (diabetes mellitus, type 2) ([place]) 03/23/2021   BPH (benign prostatic hyperplasia) 02/08/2021   Phimosis 01/27/2021   Hypertension 12/23/2020   CAD S/p remote CABG 12/23/2020   PCP:  Pcp, No Pharmacy:   [place], [place] [address] [place] [address] Phone: [phone] Fax: [phone]     Social Determinants of Health (SDOH) Interventions    Readmission Risk Interventions    01/27/2022   10:32 AM 07/04/2021    2:49 PM  Readmission Risk Prevention Plan  Transportation Screening Complete Complete  PCP or Specialist Appt within 5-7 Days  Complete  Home Care Screening  Complete  Medication Review (RN Care Manager) Complete   PCP or Specialist appointment within 3-5 days of discharge Complete   HRI or Home Care Consult Complete   SW Recovery Care/Counseling Consult Complete   Palliative Care Screening Complete   Skilled Nursing Facility Complete

## 2022-01-27 NOTE — Progress Notes (Signed)
PROGRESS NOTE    Charles [name]  ZOX:[id number] DOB: 1960/07/25 DOA: 01/26/2022 PCP: Pcp, No    Brief Narrative:  61 year old gentleman with progressive neurological decline, type 2 diabetes, anemia of chronic disease, chronic urine retention and multiple failed voiding trials and recent UTI brought to the emergency room due to patient being more confused.  He is a long-term nursing home resident, he was found to be more confused from his baseline.  In the emergency room temperature 101, heart rate 97, blood pressure stable.  Urine was grossly abnormal.  COVID influenza negative.   Assessment & Plan:   Acute UTI present on admission secondary to indwelling Foley catheter, suspect multidrug-resistant UTI with previous history.   Sepsis present on admission secondary to UTI. Acute metabolic encephalopathy secondary to UTI.  Foley catheter exchanged 10/16, blood cultures and urine cultures are pending.  Currently remains on Zosyn depending upon recent MDR UTI.  Clinically improving.  Sepsis physiology is improving. Previously failed voiding trials, outpatient urology follow-up.  Anticipate longer course of antibiotics this time depends on the culture.  Mental status is improving and normalized.  Type 2 diabetes: Poor appetite.  Keep on sliding scale insulin.  New A1c pending.  Chronic medical issues including Chronic diastolic heart failure, well compensated Hyperlipidemia: On rosuvastatin For history hypertension, on midodrine Anemia of chronic disease, stable  Progressive neurological decline: Extensively investigated with no cause found.  Followed by outpatient neurology.  Continue to work with PT OT.   DVT prophylaxis: SCDs Start: 01/26/22 2158   Code Status: DNR Family Communication: None, called patient's sister and unable to pick up. Disposition Plan: Status is: Inpatient Remains inpatient appropriate because: Severe sepsis, IV antibiotics     Consultants:   None  Procedures:  None  Antimicrobials:  Zosyn 11/16----   Subjective: Patient was seen and examined.  He is more alert and awake.  He tells me that I should do research on his case and find out why he is so weak.  Currently denies any nausea vomiting abdominal pain.  He tells me that his right leg is mostly dead.  Remains afebrile overnight.  Objective: Vitals:   01/26/22 2300 01/26/22 2355 01/27/22 0405 01/27/22 0810  BP: 132/68 122/68 115/72 103/68  Pulse: 78 74 72 76  Resp: 14 15 14 16   Temp:  98.9 F (37.2 C) (!) 97.4 F (36.3 C) 97.8 F (36.6 C)  TempSrc:  Oral Oral Oral  SpO2: 100% 100% 100% 100%  Weight:  41.2 kg 41 kg   Height:        Intake/Output Summary (Last 24 hours) at 01/27/2022 1105 Last data filed at 01/27/2022 1059 Gross per 24 hour  Intake 893.25 ml  Output 800 ml  Net 93.25 ml   Filed Weights   01/26/22 1700 01/26/22 2355 01/27/22 0405  Weight: 45.1 kg 41.2 kg 41 kg    Examination:  General exam: Appears calm and comfortable .  Patient is alert and oriented.  Does not see any evidence of confusion. Respiratory system: Clear to auscultation. Respiratory effort normal. Cardiovascular system: S1 & S2 heard, RRR. No JVD, murmurs, rubs, gallops or clicks. No pedal edema. Gastrointestinal system: Abdomen is nondistended, soft and nontender. No organomegaly or masses felt. Normal bowel sounds heard. Foley catheter with clear urine. Central nervous system: Alert and oriented.  Generalized weakness, lower extremities are more weaker than upper extremities.  Right lower extremity is weaker than the left lower extremity.    Data Reviewed: I have personally  reviewed following labs and imaging studies  CBC: Recent Labs  Lab 01/26/22 1718 01/27/22 0532  WBC 13.1* 10.9*  NEUTROABS 11.1* 8.2*  HGB 10.5* 9.4*  HCT 32.6* 29.1*  MCV 86.5 86.1  PLT 314 236   Basic Metabolic Panel: Recent Labs  Lab 01/26/22 1718 01/27/22 0532 01/27/22 0533   NA 135 133*  --   K 4.4 3.9  --   CL 100 102  --   CO2 28 27  --   GLUCOSE 193* 214*  --   BUN 10 9  --   CREATININE 0.66 0.66  --   CALCIUM 8.9 8.5*  --   MG  --  1.6* 1.7   GFR: Estimated Creatinine Clearance: 58.4 mL/min (by C-G formula based on SCr of 0.66 mg/dL). Liver Function Tests: Recent Labs  Lab 01/26/22 1718 01/27/22 0532  AST 20 27  ALT 26 24  ALKPHOS 99 82  BILITOT 0.5 0.4  PROT 7.9 6.7  ALBUMIN 2.8* 2.4*   No results for input(s): "LIPASE", "AMYLASE" in the last 168 hours. No results for input(s): "AMMONIA" in the last 168 hours. Coagulation Profile: Recent Labs  Lab 01/26/22 1718  INR 1.0   Cardiac Enzymes: No results for input(s): "CKTOTAL", "CKMB", "CKMBINDEX", "TROPONINI" in the last 168 hours. BNP (last 3 results) No results for input(s): "PROBNP" in the last 8760 hours. HbA1C: Recent Labs    01/27/22 0532  HGBA1C 7.7*   CBG: Recent Labs  Lab 01/27/22 0107 01/27/22 0756  GLUCAP 220* 185*   Lipid Profile: No results for input(s): "CHOL", "HDL", "LDLCALC", "TRIG", "CHOLHDL", "LDLDIRECT" in the last 72 hours. Thyroid Function Tests: Recent Labs    01/27/22 0532  TSH 2.840   Anemia Panel: No results for input(s): "VITAMINB12", "FOLATE", "FERRITIN", "TIBC", "IRON", "RETICCTPCT" in the last 72 hours. Sepsis Labs: Recent Labs  Lab 01/26/22 1718 01/26/22 2118 01/27/22 0533  PROCALCITON  --   --  0.24  LATICACIDVEN 2.5* 1.3  --     Recent Results (from the past 240 hour(s))  Blood Culture (routine x 2)     Status: None (Preliminary result)   Collection Time: 01/26/22  5:18 PM   Specimen: BLOOD  Result Value Ref Range Status   Specimen Description   Final    BLOOD RIGHT ANTECUBITAL Performed at [facility], [address] [place], [place] [address]    Special Requests   Final    BOTTLES DRAWN AEROBIC AND ANAEROBIC Blood Culture adequate volume Performed at [facility], [address], [place], [place] [address]    Culture   Final    NO GROWTH < 12 HOURS Performed at [facility], [address], [place], [place] [address]    Report Status PENDING  Incomplete  Blood Culture (routine x 2)     Status: None (Preliminary result)   Collection Time: 01/26/22  5:58 PM   Specimen: BLOOD  Result Value Ref Range Status   Specimen Description   Final    BLOOD SITE NOT SPECIFIED Performed at [facility], [address] [place], [place] [address]    Special Requests   Final    BOTTLES DRAWN AEROBIC AND ANAEROBIC Blood Culture adequate volume Performed at [facility], [address], [place], [place] [address]    Culture   Final    NO GROWTH < 12 HOURS Performed at [facility], [address], [place], [place] [address]  Report Status PENDING  Incomplete  Resp Panel by RT-PCR (Flu A&B, Covid) Anterior Nasal Swab     Status: None   Collection Time: 01/26/22  8:31 PM   Specimen: Anterior Nasal Swab  Result Value Ref Range Status   SARS Coronavirus 2 by RT PCR NEGATIVE NEGATIVE Final    Comment: (NOTE) SARS-CoV-2 target nucleic acids are NOT DETECTED.  The SARS-CoV-2 RNA is generally detectable in upper respiratory specimens during the acute phase of infection. The lowest concentration of SARS-CoV-2 viral copies this assay can detect is 138 copies/mL. A negative result does not preclude SARS-Cov-2 infection and should not be used as the sole basis for treatment or other patient management decisions. A negative result may occur with  improper specimen collection/handling, submission of specimen other than nasopharyngeal swab, presence of viral mutation(s) within the areas targeted by this assay, and inadequate number of viral copies(<138 copies/mL). A negative result must be combined with clinical observations, patient history, and epidemiological information. The expected result is Negative.  Fact Sheet for  Patients:  BloggerCourse.com  Fact Sheet for Healthcare Providers:  SeriousBroker.it  This test is no t yet approved or cleared by the [place] FDA and  has been authorized for detection and/or diagnosis of SARS-CoV-2 by FDA under an Emergency Use Authorization (EUA). This EUA will remain  in effect (meaning this test can be used) for the duration of the COVID-19 declaration under Section 564(b)(1) of the Act, 21 U.S.C.section 360bbb-3(b)(1), unless the authorization is terminated  or revoked sooner.       Influenza A by PCR NEGATIVE NEGATIVE Final   Influenza B by PCR NEGATIVE NEGATIVE Final    Comment: (NOTE) The Xpert Xpress SARS-CoV-2/FLU/RSV plus assay is intended as an aid in the diagnosis of influenza from Nasopharyngeal swab specimens and should not be used as a sole basis for treatment. Nasal washings and aspirates are unacceptable for Xpert Xpress SARS-CoV-2/FLU/RSV testing.  Fact Sheet for Patients: BloggerCourse.com  Fact Sheet for Healthcare Providers: SeriousBroker.it  This test is not yet approved or cleared by the [place] FDA and has been authorized for detection and/or diagnosis of SARS-CoV-2 by FDA under an Emergency Use Authorization (EUA). This EUA will remain in effect (meaning this test can be used) for the duration of the COVID-19 declaration under Section 564(b)(1) of the Act, 21 U.S.C. section 360bbb-3(b)(1), unless the authorization is terminated or revoked.  Performed at [facility], [address] [place] [address], [place], [place] [address]   MRSA Next Gen by PCR, Nasal     Status: Abnormal   Collection Time: 01/27/22  1:20 AM  Result Value Ref Range Status   MRSA by PCR Next Gen DETECTED (A) NOT DETECTED Final    Comment: (NOTE) The GeneXpert MRSA Assay (FDA approved for NASAL specimens only), is one component of a comprehensive  MRSA colonization surveillance program. It is not intended to diagnose MRSA infection nor to guide or monitor treatment for MRSA infections. Test performance is not FDA approved in patients less than 72 years old. Performed at [facility], [address] [place], [place] [address]          Radiology Studies: DG Chest Port 1 View  Result Date: 01/26/2022 CLINICAL DATA:  Sepsis, weakness EXAM: PORTABLE CHEST 1 VIEW COMPARISON:  11/29/2021 FINDINGS: Single frontal view of the chest demonstrates an unremarkable cardiac silhouette. No airspace disease, effusion, or pneumothorax. Subacute healing left posterior fourth rib fracture. No acute bony abnormalities. IMPRESSION: 1. No  acute intrathoracic process. Electronically Signed   By: Sharlet Salina M.D.   On: 01/26/2022 17:53        Scheduled Meds:  aspirin EC  81 mg Oral Daily   docusate sodium  100 mg Oral QHS   dronabinol  5 mg Oral BID AC   ezetimibe  10 mg Oral Daily   feeding supplement (GLUCERNA SHAKE)  237 mL Oral TID BM   insulin aspart  0-6 Units Subcutaneous TID WC   midodrine  10 mg Oral TID WC   rosuvastatin  20 mg Oral Daily   thiamine  100 mg Oral Daily   Continuous Infusions:  piperacillin-tazobactam (ZOSYN)  IV 3.375 g (01/27/22 0635)     LOS: 1 day    Time spent: 35 minutes    Dorcas Carrow, MD [facility] Pager [phone]

## 2022-01-28 DIAGNOSIS — R6520 Severe sepsis without septic shock: Secondary | ICD-10-CM | POA: Diagnosis not present

## 2022-01-28 DIAGNOSIS — G9341 Metabolic encephalopathy: Secondary | ICD-10-CM | POA: Diagnosis not present

## 2022-01-28 DIAGNOSIS — A419 Sepsis, unspecified organism: Secondary | ICD-10-CM | POA: Diagnosis not present

## 2022-01-28 LAB — CBC WITH DIFFERENTIAL/PLATELET
Abs Immature Granulocytes: 0.04 10*3/uL (ref 0.00–0.07)
Basophils Absolute: 0.1 10*3/uL (ref 0.0–0.1)
Basophils Relative: 1 %
Eosinophils Absolute: 0.2 10*3/uL (ref 0.0–0.5)
Eosinophils Relative: 2 %
HCT: 31.2 % — ABNORMAL LOW (ref 39.0–52.0)
Hemoglobin: 10 g/dL — ABNORMAL LOW (ref 13.0–17.0)
Immature Granulocytes: 0 %
Lymphocytes Relative: 16 %
Lymphs Abs: 1.5 10*3/uL (ref 0.7–4.0)
MCH: 27.5 pg (ref 26.0–34.0)
MCHC: 32.1 g/dL (ref 30.0–36.0)
MCV: 85.7 fL (ref 80.0–100.0)
Monocytes Absolute: 1.2 10*3/uL — ABNORMAL HIGH (ref 0.1–1.0)
Monocytes Relative: 13 %
Neutro Abs: 6.2 10*3/uL (ref 1.7–7.7)
Neutrophils Relative %: 68 %
Platelets: 277 10*3/uL (ref 150–400)
RBC: 3.64 MIL/uL — ABNORMAL LOW (ref 4.22–5.81)
RDW: 15.5 % (ref 11.5–15.5)
WBC: 9.2 10*3/uL (ref 4.0–10.5)
nRBC: 0 % (ref 0.0–0.2)

## 2022-01-28 LAB — COMPREHENSIVE METABOLIC PANEL
ALT: 26 U/L (ref 0–44)
AST: 29 U/L (ref 15–41)
Albumin: 2.4 g/dL — ABNORMAL LOW (ref 3.5–5.0)
Alkaline Phosphatase: 87 U/L (ref 38–126)
Anion gap: 6 (ref 5–15)
BUN: 10 mg/dL (ref 6–20)
CO2: 26 mmol/L (ref 22–32)
Calcium: 8.7 mg/dL — ABNORMAL LOW (ref 8.9–10.3)
Chloride: 101 mmol/L (ref 98–111)
Creatinine, Ser: 0.54 mg/dL — ABNORMAL LOW (ref 0.61–1.24)
GFR, Estimated: >60 mL/min (ref >60)
Glucose, Bld: 273 mg/dL — ABNORMAL HIGH (ref 70–99)
Potassium: 3.9 mmol/L (ref 3.5–5.1)
Sodium: 133 mmol/L — ABNORMAL LOW (ref 135–145)
Total Bilirubin: 0.4 mg/dL (ref 0.3–1.2)
Total Protein: 6.8 g/dL (ref 6.5–8.1)

## 2022-01-28 LAB — GLUCOSE, CAPILLARY
Glucose-Capillary: 313 mg/dL — ABNORMAL HIGH (ref 70–99)
Glucose-Capillary: 282 mg/dL — ABNORMAL HIGH (ref 70–99)
Glucose-Capillary: 344 mg/dL — ABNORMAL HIGH (ref 70–99)
Glucose-Capillary: 332 mg/dL — ABNORMAL HIGH (ref 70–99)

## 2022-01-28 LAB — MAGNESIUM: Magnesium: 1.7 mg/dL (ref 1.7–2.4)

## 2022-01-28 LAB — PHOSPHORUS: Phosphorus: 3 mg/dL (ref 2.5–4.6)

## 2022-01-28 MED ORDER — CHLORHEXIDINE GLUCONATE CLOTH 2 % EX PADS
6.0000 | MEDICATED_PAD | Freq: Every day | CUTANEOUS | Status: DC
  Administered 2022-01-28 – 2022-01-30 (×3): 6 via TOPICAL

## 2022-01-28 NOTE — Progress Notes (Signed)
PROGRESS NOTE    Charles [name]  VHQ:[id number] DOB: 12-06-1960 DOA: 01/26/2022 PCP: Pcp, No    Brief Narrative:  61 year old gentleman with progressive neurological decline, type 2 diabetes, anemia of chronic disease, chronic urine retention and multiple failed voiding trials and recent UTI brought to the emergency room due to patient being more confused.  He is a long-term nursing home resident, he was found to be more confused from his baseline.  In the emergency room temperature 101, heart rate 97, blood pressure stable.  Urine was grossly abnormal.  COVID influenza negative.   Assessment & Plan:   Acute UTI present on admission secondary to indwelling Foley catheter,  Sepsis present on admission secondary to UTI. Acute metabolic encephalopathy secondary to UTI. Blood cultures 11/16 with Prodencia Urine cultures 11/16 with more than 100,000 colonies of gram-negative bacteria.  Foley catheter exchanged 10/16.  Patient was on Zosyn.  Currently remains on meropenem until final cultures.  Clinically improving.  Sepsis physiology is improving. Previously failed voiding trials, outpatient urology follow-up.  Anticipate longer course of antibiotics this time depends on the culture.  Mental status is improving and normalized.  Type 2 diabetes: Poor appetite.  Keep on sliding scale insulin.  New A1c pending.  Chronic medical issues including Chronic diastolic heart failure, well compensated Hyperlipidemia: On rosuvastatin For history hypertension, on midodrine Anemia of chronic disease, stable  Progressive neurological decline: Extensively investigated with no cause found.  Followed by outpatient neurology.  Continue to work with PT OT.   DVT prophylaxis: SCDs Start: 01/26/22 2158   Code Status: DNR Family Communication: None, called patient's sister but she was unable to pick up again. Disposition Plan: Status is: Inpatient Remains inpatient appropriate because: Severe sepsis, IV  antibiotics     Consultants:  None  Procedures:  None  Antimicrobials:  Zosyn 11/16----11/16 Meropenem 11/17-----   Subjective:  Patient seen and examined.  No overnight events.  Afebrile.  Denies any complaints.  Today is his birthday and he was expecting call from his sister. He tells me that he is planning to transfer to [place] sometimes in the future.  He wants to explore options of going to a new nursing home.  Social worker on board.  Objective: Vitals:   01/27/22 2027 01/28/22 0500 01/28/22 0509 01/28/22 1217  BP: 127/74  112/61 110/78  Pulse: 73  73 84  Resp: 18  16 20   Temp: 98.1 F (36.7 C)  98.1 F (36.7 C)   TempSrc: Oral  Oral   SpO2: 100%  100% 100%  Weight:  46.2 kg    Height:        Intake/Output Summary (Last 24 hours) at 01/28/2022 1331 Last data filed at 01/28/2022 1133 Gross per 24 hour  Intake 1027 ml  Output 1600 ml  Net -573 ml   Filed Weights   01/26/22 2355 01/27/22 0405 01/28/22 0500  Weight: 41.2 kg 41 kg 46.2 kg    Examination:  General exam: Appears calm and comfortable .  Interactive.  Alert and oriented x4. Respiratory system: Clear to auscultation. Respiratory effort normal. Cardiovascular system: S1 & S2 heard, RRR. No JVD, murmurs, rubs, gallops or clicks. No pedal edema. Gastrointestinal system: Abdomen is nondistended, soft and nontender. No organomegaly or masses felt. Normal bowel sounds heard. Foley catheter with clear urine. Central nervous system: Alert and oriented.  Generalized weakness, lower extremities are more weaker than upper extremities.  Right lower extremity is weaker than the left lower extremity.    Data  Reviewed: I have personally reviewed following labs and imaging studies  CBC: Recent Labs  Lab 01/26/22 1718 01/27/22 0532 01/28/22 0537  WBC 13.1* 10.9* 9.2  NEUTROABS 11.1* 8.2* 6.2  HGB 10.5* 9.4* 10.0*  HCT 32.6* 29.1* 31.2*  MCV 86.5 86.1 85.7  PLT 314 236 277   Basic Metabolic  Panel: Recent Labs  Lab 01/26/22 1718 01/27/22 0532 01/27/22 0533 01/28/22 0537  NA 135 133*  --  133*  K 4.4 3.9  --  3.9  CL 100 102  --  101  CO2 28 27  --  26  GLUCOSE 193* 214*  --  273*  BUN 10 9  --  10  CREATININE 0.66 0.66  --  0.54*  CALCIUM 8.9 8.5*  --  8.7*  MG  --  1.6* 1.7 1.7  PHOS  --   --   --  3.0   GFR: Estimated Creatinine Clearance: 65 mL/min (A) (by C-G formula based on SCr of 0.54 mg/dL (L)). Liver Function Tests: Recent Labs  Lab 01/26/22 1718 01/27/22 0532 01/28/22 0537  AST 20 27 29   ALT 26 24 26   ALKPHOS 99 82 87  BILITOT 0.5 0.4 0.4  PROT 7.9 6.7 6.8  ALBUMIN 2.8* 2.4* 2.4*   No results for input(s): "LIPASE", "AMYLASE" in the last 168 hours. No results for input(s): "AMMONIA" in the last 168 hours. Coagulation Profile: Recent Labs  Lab 01/26/22 1718  INR 1.0   Cardiac Enzymes: No results for input(s): "CKTOTAL", "CKMB", "CKMBINDEX", "TROPONINI" in the last 168 hours. BNP (last 3 results) No results for input(s): "PROBNP" in the last 8760 hours. HbA1C: Recent Labs    01/27/22 0532  HGBA1C 7.7*   CBG: Recent Labs  Lab 01/27/22 1110 01/27/22 1628 01/27/22 2132 01/28/22 0752 01/28/22 1144  GLUCAP 231* 326* 230* 313* 282*   Lipid Profile: No results for input(s): "CHOL", "HDL", "LDLCALC", "TRIG", "CHOLHDL", "LDLDIRECT" in the last 72 hours. Thyroid Function Tests: Recent Labs    01/27/22 0532  TSH 2.840   Anemia Panel: No results for input(s): "VITAMINB12", "FOLATE", "FERRITIN", "TIBC", "IRON", "RETICCTPCT" in the last 72 hours. Sepsis Labs: Recent Labs  Lab 01/26/22 1718 01/26/22 2118 01/27/22 0533  PROCALCITON  --   --  0.24  LATICACIDVEN 2.5* 1.3  --     Recent Results (from the past 240 hour(s))  Blood Culture (routine x 2)     Status: Abnormal (Preliminary result)   Collection Time: 01/26/22  5:18 PM   Specimen: BLOOD  Result Value Ref Range Status   Specimen Description   Final    BLOOD RIGHT  ANTECUBITAL Performed at [facility], [address] [place], [place]    Special Requests   Final    BOTTLES DRAWN AEROBIC AND ANAEROBIC Blood Culture adequate volume Performed at [facility], [address] [place], [place]    Culture  Setup Time   Final    GRAM NEGATIVE RODS ANAEROBIC BOTTLE ONLY CRITICAL RESULT CALLED TO, READ BACK BY AND VERIFIED WITH: PHARMD C.SHEAD AT 1434 ON 01/27/2022 BY T.SAAD.    Culture (A)  Final    PROVIDENCIA RETTGERI SUSCEPTIBILITIES TO FOLLOW Performed at [facility], [address], [place], [address] [place]    Report Status PENDING  Incomplete  Blood Culture ID Panel (Reflexed)     Status: Abnormal   Collection Time: 01/26/22  5:18 PM  Result Value Ref Range Status   Enterococcus faecalis  NOT DETECTED NOT DETECTED Final   Enterococcus Faecium NOT DETECTED NOT DETECTED Final   Listeria monocytogenes NOT DETECTED NOT DETECTED Final   Staphylococcus species NOT DETECTED NOT DETECTED Final   Staphylococcus aureus (BCID) NOT DETECTED NOT DETECTED Final   Staphylococcus epidermidis NOT DETECTED NOT DETECTED Final   Staphylococcus lugdunensis NOT DETECTED NOT DETECTED Final   Streptococcus species NOT DETECTED NOT DETECTED Final   Streptococcus agalactiae NOT DETECTED NOT DETECTED Final   Streptococcus pneumoniae NOT DETECTED NOT DETECTED Final   Streptococcus pyogenes NOT DETECTED NOT DETECTED Final   A.calcoaceticus-baumannii NOT DETECTED NOT DETECTED Final   Bacteroides fragilis NOT DETECTED NOT DETECTED Final   Enterobacterales DETECTED (A) NOT DETECTED Final    Comment: Enterobacterales represent a large order of gram negative bacteria, not a single organism. Refer to culture for further identification. CRITICAL RESULT CALLED TO, READ BACK BY AND VERIFIED WITH: PHARMD C.SHEAD AT 1434 ON 01/27/2022 BY T.SAAD.    Enterobacter cloacae complex NOT DETECTED NOT DETECTED Final    Escherichia coli NOT DETECTED NOT DETECTED Final   Klebsiella aerogenes NOT DETECTED NOT DETECTED Final   Klebsiella oxytoca NOT DETECTED NOT DETECTED Final   Klebsiella pneumoniae NOT DETECTED NOT DETECTED Final   Proteus species NOT DETECTED NOT DETECTED Final   Salmonella species NOT DETECTED NOT DETECTED Final   Serratia marcescens NOT DETECTED NOT DETECTED Final   Haemophilus influenzae NOT DETECTED NOT DETECTED Final   Neisseria meningitidis NOT DETECTED NOT DETECTED Final   Pseudomonas aeruginosa NOT DETECTED NOT DETECTED Final   Stenotrophomonas maltophilia NOT DETECTED NOT DETECTED Final   Candida albicans NOT DETECTED NOT DETECTED Final   Candida auris NOT DETECTED NOT DETECTED Final   Candida glabrata NOT DETECTED NOT DETECTED Final   Candida krusei NOT DETECTED NOT DETECTED Final   Candida parapsilosis NOT DETECTED NOT DETECTED Final   Candida tropicalis NOT DETECTED NOT DETECTED Final   Cryptococcus neoformans/gattii NOT DETECTED NOT DETECTED Final   CTX-M ESBL DETECTED (A) NOT DETECTED Final    Comment: CRITICAL RESULT CALLED TO, READ BACK BY AND VERIFIED WITH: PHARMD C.SHEAD AT 1434 ON 01/27/2022 BY T.SAAD. (NOTE) Extended spectrum beta-lactamase detected. Recommend a carbapenem as initial therapy.      Carbapenem resistance IMP NOT DETECTED NOT DETECTED Final   Carbapenem resistance KPC NOT DETECTED NOT DETECTED Final   Carbapenem resistance NDM NOT DETECTED NOT DETECTED Final   Carbapenem resist OXA 48 LIKE NOT DETECTED NOT DETECTED Final   Carbapenem resistance VIM NOT DETECTED NOT DETECTED Final    Comment: Performed at [facility], [address], [place], [place] [address]  Blood Culture (routine x 2)     Status: None (Preliminary result)   Collection Time: 01/26/22  5:58 PM   Specimen: BLOOD  Result Value Ref Range Status   Specimen Description   Final    BLOOD SITE NOT SPECIFIED Performed at [facility], [address],  [place], [place] [address]    Special Requests   Final    BOTTLES DRAWN AEROBIC AND ANAEROBIC Blood Culture adequate volume Performed at [facility], [address] [place], [place] [address]    Culture   Final    NO GROWTH 2 DAYS Performed at [facility], [address], [place], [place] [address]    Report Status PENDING  Incomplete  Urine Culture     Status: Abnormal (Preliminary result)   Collection Time: 01/26/22  8:31 PM   Specimen:  In/Out Cath Urine  Result Value Ref Range Status   Specimen Description   Final    IN/OUT CATH URINE Performed at [facility], [address], [place], [place] [address]    Special Requests   Final    NONE Performed at [facility], [address], [place], [place] [address]    Culture (A)  Final    >=100,000 COLONIES/mL GRAM NEGATIVE RODS CULTURE REINCUBATED FOR BETTER GROWTH SUSCEPTIBILITIES TO FOLLOW Performed at [facility], [address] [place], [place] [address]    Report Status PENDING  Incomplete  Resp Panel by RT-PCR (Flu A&B, Covid) Anterior Nasal Swab     Status: None   Collection Time: 01/26/22  8:31 PM   Specimen: Anterior Nasal Swab  Result Value Ref Range Status   SARS Coronavirus 2 by RT PCR NEGATIVE NEGATIVE Final    Comment: (NOTE) SARS-CoV-2 target nucleic acids are NOT DETECTED.  The SARS-CoV-2 RNA is generally detectable in upper respiratory specimens during the acute phase of infection. The lowest concentration of SARS-CoV-2 viral copies this assay can detect is 138 copies/mL. A negative result does not preclude SARS-Cov-2 infection and should not be used as the sole basis for treatment or other patient management decisions. A negative result may occur with  improper specimen collection/handling, submission of specimen other than nasopharyngeal swab, presence of viral mutation(s) within the areas targeted by this assay, and inadequate number of  viral copies(<138 copies/mL). A negative result must be combined with clinical observations, patient history, and epidemiological information. The expected result is Negative.  Fact Sheet for Patients:  BloggerCourse.com  Fact Sheet for Healthcare Providers:  SeriousBroker.it  This test is no t yet approved or cleared by the [place] FDA and  has been authorized for detection and/or diagnosis of SARS-CoV-2 by FDA under an Emergency Use Authorization (EUA). This EUA will remain  in effect (meaning this test can be used) for the duration of the COVID-19 declaration under Section 564(b)(1) of the Act, 21 U.S.C.section 360bbb-3(b)(1), unless the authorization is terminated  or revoked sooner.       Influenza A by PCR NEGATIVE NEGATIVE Final   Influenza B by PCR NEGATIVE NEGATIVE Final    Comment: (NOTE) The Xpert Xpress SARS-CoV-2/FLU/RSV plus assay is intended as an aid in the diagnosis of influenza from Nasopharyngeal swab specimens and should not be used as a sole basis for treatment. Nasal washings and aspirates are unacceptable for Xpert Xpress SARS-CoV-2/FLU/RSV testing.  Fact Sheet for Patients: BloggerCourse.com  Fact Sheet for Healthcare Providers: SeriousBroker.it  This test is not yet approved or cleared by the [place] FDA and has been authorized for detection and/or diagnosis of SARS-CoV-2 by FDA under an Emergency Use Authorization (EUA). This EUA will remain in effect (meaning this test can be used) for the duration of the COVID-19 declaration under Section 564(b)(1) of the Act, 21 U.S.C. section 360bbb-3(b)(1), unless the authorization is terminated or revoked.  Performed at [facility], [address] [place], [place] [address]   MRSA Next Gen by PCR, Nasal     Status: Abnormal   Collection Time: 01/27/22  1:20 AM  Result Value  Ref Range Status   MRSA by PCR Next Gen DETECTED (A) NOT DETECTED Final    Comment: (NOTE) The GeneXpert MRSA Assay (FDA approved for NASAL specimens only), is one component of a comprehensive MRSA colonization surveillance program. It is not intended to diagnose MRSA infection nor  to guide or monitor treatment for MRSA infections. Test performance is not FDA approved in patients less than 66 years old. Performed at [facility], [address] [place], [place] [address]          Radiology Studies: DG Chest Port 1 View  Result Date: 01/26/2022 CLINICAL DATA:  Sepsis, weakness EXAM: PORTABLE CHEST 1 VIEW COMPARISON:  11/29/2021 FINDINGS: Single frontal view of the chest demonstrates an unremarkable cardiac silhouette. No airspace disease, effusion, or pneumothorax. Subacute healing left posterior fourth rib fracture. No acute bony abnormalities. IMPRESSION: 1. No acute intrathoracic process. Electronically Signed   By: Sharlet Salina M.D.   On: 01/26/2022 17:53        Scheduled Meds:  aspirin EC  81 mg Oral Daily   Chlorhexidine Gluconate Cloth  6 each Topical Daily   docusate sodium  100 mg Oral QHS   dronabinol  5 mg Oral BID AC   ezetimibe  10 mg Oral Daily   feeding supplement (GLUCERNA SHAKE)  237 mL Oral TID BM   insulin aspart  0-6 Units Subcutaneous TID WC   midodrine  10 mg Oral TID WC   rosuvastatin  20 mg Oral Daily   thiamine  100 mg Oral Daily   Continuous Infusions:  meropenem (MERREM) IV 1 g (01/28/22 0843)     LOS: 2 days    Time spent: 35 minutes    Dorcas Carrow, MD [facility] Pager [phone]

## 2022-01-28 NOTE — Evaluation (Addendum)
Physical Therapy Evaluation Patient Details Name: Charles [name] MRN: [id number] DOB: [birth year] Today's Date: 01/28/2022  History of Present Illness  61 yo male admitted with UTI, sepsis. Hx of chronic foley, CHF, dementia, DM, anemia, malnutrition. Pt is LTC from SNF  Clinical Impression  On eval, pt required Mod A +2 for mobility. He was able to sit EOB with Min-Mod A for static sitting balance. Assisted pt into recliner with +2 A and lateral scoot (drop arm recliner). Pt continues to have dizziness with change in position. He is at risk for falls when mobilizing. Will follow on a limited basis during this hospital stay. Will need to d/c to a SNF once medically stable.       Recommendations for follow up therapy are one component of a multi-disciplinary discharge planning process, led by the attending physician.  Recommendations may be updated based on patient status, additional functional criteria and insurance authorization.  Follow Up Recommendations Skilled Nursing Facility   Can patient physically be transported by private vehicle: No    Assistance Recommended at Discharge Frequent or constant Supervision/Assistance  Patient can return home with the following       Equipment Recommendations None recommended by PT  Recommendations for Other Services       Functional Status Assessment Patient has had a recent decline in their functional status and/or demonstrates limited ability to make significant improvements in function in a reasonable and predictable amount of time     Precautions / Restrictions Precautions Precautions: Fall Precaution Comments: incontinent Restrictions Weight Bearing Restrictions: No      Mobility  Bed Mobility Overal bed mobility: Needs Assistance Bed Mobility: Supine to Sit     Supine to sit: Min assist, +2 for physical assistance, +2 for safety/equipment, HOB elevated     General bed mobility comments: Assist for trunk and bil LEs.  Increased time. Cues for safety. Poor sitting and dynamic balance. LOB x 2, especially when pt released bil UE support from bed. Initially had dizziness that resolved enough to attempt transfer to recliner.    Transfers Overall transfer level: Needs assistance   Transfers: Bed to chair/wheelchair/BSC            Lateral/Scoot Transfers: Mod assist, +2 safety/equipment, +2 physical assistance General transfer comment: Assist to shift weight anteriorly and to transition, using multiple scoots, from bed to recliner. Utilized bedpad to aid with scooting. Increased time.    Ambulation/Gait               General Gait Details: NT  Stairs            Wheelchair Mobility    Modified Rankin (Stroke Patients Only)       Balance Overall balance assessment: Needs assistance Sitting-balance support: Bilateral upper extremity supported, Feet supported Sitting balance-Leahy Scale: Poor   Postural control: Posterior lean                                   Pertinent Vitals/Pain Pain Assessment Pain Assessment: Faces Faces Pain Scale: Hurts little more Pain Location: buttocks Pain Descriptors / Indicators: Discomfort, Sore, Grimacing Pain Intervention(s): Limited activity within patient's tolerance, Repositioned (pillows placed in seat of recliner)    Home Living Family/patient expects to be discharged to:: Skilled nursing facility     Type of Home: Skilled Nursing Facility             Additional Comments: At [facility]  since April.    Prior Function Prior Level of Function : Needs assist             Mobility Comments: +2 assist on last admission. Per chart review, mostly in bed per facility ADLs Comments: Assistance for ADLs. Incontinent. Could feed himself. Able to have fluent conversation typically.     Hand Dominance        Extremity/Trunk Assessment   Upper Extremity Assessment Upper Extremity Assessment: Generalized weakness     Lower Extremity Assessment Lower Extremity Assessment: RLE deficits/detail RLE Deficits / Details: knee ext ~15 degrees from 0. hip ~2/5.leg tends to adduct quite a bit resulting in leg crossing    Cervical / Trunk Assessment Cervical / Trunk Assessment: Normal  Communication      Cognition Arousal/Alertness: Awake/alert Behavior During Therapy: WFL for tasks assessed/performed Overall Cognitive Status: History of cognitive impairments - at baseline                                          General Comments      Exercises     Assessment/Plan    PT Assessment Patient needs continued PT services  PT Problem List Decreased strength;Decreased mobility;Decreased range of motion;Decreased activity tolerance;Decreased balance;Decreased knowledge of use of DME;Pain;Decreased skin integrity;Decreased cognition;Decreased coordination;Impaired sensation       PT Treatment Interventions Therapeutic activities;Therapeutic exercise;Patient/family education;Balance training;Functional mobility training;Wheelchair mobility training    PT Goals (Current goals can be found in the Care Plan section)  Acute Rehab PT Goals Patient Stated Goal: to get better PT Goal Formulation: With patient Time For Goal Achievement: 02/11/22 Potential to Achieve Goals: Poor    Frequency Min 2X/week     Co-evaluation               AM-PAC PT "6 Clicks" Mobility  Outcome Measure Help needed turning from your back to your side while in a flat bed without using bedrails?: A Lot Help needed moving from lying on your back to sitting on the side of a flat bed without using bedrails?: A Lot Help needed moving to and from a bed to a chair (including a wheelchair)?: Total Help needed standing up from a chair using your arms (e.g., wheelchair or bedside chair)?: Total Help needed to walk in hospital room?: Total Help needed climbing 3-5 steps with a railing? : Total 6 Click Score: 8     End of Session   Activity Tolerance: Patient tolerated treatment well Patient left: in chair;with call bell/phone within reach Nurse Communication: Need for lift equipment      Time: 1142-1210 PT Time Calculation (min) (ACUTE ONLY): 28 min   Charges:   PT Evaluation $PT Eval Moderate Complexity: 1 Mod            Faye Ramsay, PT Acute Rehabilitation  Office: [phone]

## 2022-01-29 ENCOUNTER — Inpatient Hospital Stay: Payer: Self-pay

## 2022-01-29 DIAGNOSIS — N3000 Acute cystitis without hematuria: Secondary | ICD-10-CM | POA: Diagnosis not present

## 2022-01-29 LAB — GLUCOSE, CAPILLARY
Glucose-Capillary: 280 mg/dL — ABNORMAL HIGH (ref 70–99)
Glucose-Capillary: 289 mg/dL — ABNORMAL HIGH (ref 70–99)
Glucose-Capillary: 393 mg/dL — ABNORMAL HIGH (ref 70–99)
Glucose-Capillary: 297 mg/dL — ABNORMAL HIGH (ref 70–99)

## 2022-01-29 LAB — CULTURE, BLOOD (ROUTINE X 2): Special Requests: ADEQUATE

## 2022-01-29 MED ORDER — SODIUM CHLORIDE 0.9% FLUSH
10.0000 mL | Freq: Two times a day (BID) | INTRAVENOUS | Status: DC
  Administered 2022-01-30: 10 mL

## 2022-01-29 MED ORDER — SODIUM CHLORIDE 0.9% FLUSH: 10.0000 mL | INTRAVENOUS | Status: DC | PRN

## 2022-01-29 MED ORDER — INSULIN GLARGINE-YFGN 100 UNIT/ML ~~LOC~~ SOLN
10.0000 [IU] | Freq: Every day | SUBCUTANEOUS | Status: DC
  Administered 2022-01-29 – 2022-01-30 (×2): 10 [IU] via SUBCUTANEOUS
  Filled 2022-01-29 (×2): qty 0.1

## 2022-01-29 NOTE — H&P (Addendum)
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  Would not treat in any further for the faecalis. - I think 10 days of antibiotics for complicated UTI sufficient can complete the course with ertapenem.  Opted for a longer course as he has a chronic indwelling Foley with history of recurrent UTI Recommendations:  -Continue merrem while inpatient(transition to ertapenem  1gm q24h) -Place PICC, transition to ertapenem on discharge  to complete 10 days of antibiotics -ID F/U on11/27 -Needs urology referral. He states he had been followed by Urology in the past. Has not seen them recently (2 months or so) due ot insurance issues.    ID will sign off, please engage with any question or concerns.   OPAT ORDERS:  Diagnosis: Provedencia retgerri bacteremia 2/2 UTI    No Known Allergies   Discharge antibiotics to be given via PICC line:  Per pharmacy protocol ertapenem 1gm q24h   Duration: 10 days End Date: 11/25  [facility] Care Per Protocol with Biopatch Use: Home health RN for IV administration and teaching, line care and labs.    Labs weekly while on IV antibiotics: _x_ CBC with ifferential _x_ CMP _x_ CRP _x_ ESR   x__ Please pull PIC at  completion of IV antibiotics   Fax weekly labs to [phone]  Clinic Follow Up Appt: 11/27  @ [facility] with Dr. Candiss Norse  Microbiology:   Antibiotics: Merrem Piperacillin tazobactam Cultures: Blood  1/16 1/2 prevention of Rytary Urine  /16 procidentia and a faecalis Other   HPI: Charles [name] is a 61 y.o. male with chronic indwelling Foley catheter, recurrent UTI, heart failure, dementia, diabetes, anemia of chronic disease admitted to [facility] for sepsis secondary to UTI.  He had presented from SNF with new onset fever and confusion on arrival to the ED patient had a temp of 101, WBC 13 K.  Initially started on PIP Tazo.  He was transitioned to meropenem as urine and blood cultures returned positive for retention (ESBL.  ID engaged for antibiotic recommendations.  Review of Systems: Review of Systems  All other systems reviewed and are negative.   Past Medical History:  Diagnosis Date   Arthritis    Chronic diastolic heart failure (HCC)    Dementia (HCC)    Diabetes mellitus without complication ([place])    Heart disease    History of recurrent UTIs    (has chronic indwelling foley catheter)   Hypertension     Social History   Tobacco Use   Smoking status: Never   Smokeless tobacco: Never  Vaping Use   Vaping Use: Never used  Substance Use Topics   Alcohol use: Not Currently    Comment: none in  6 months may open a bottle of wine tonight 11/1112022   Drug use: Never    Family History  Problem Relation Age of Onset   Diabetes Mother    Hypertension Mother    Diabetes Father    Hypertension Father    Heart attack Father    Diabetes Sister    Diabetes Sister    Scheduled Meds:  aspirin EC  81 mg Oral Daily   Chlorhexidine Gluconate Cloth  6 each Topical Daily   docusate sodium  100 mg Oral QHS   dronabinol  5 mg Oral BID AC   ezetimibe  10 mg Oral Daily   feeding supplement (GLUCERNA SHAKE)  237 mL Oral TID BM   insulin aspart  0-6 Units Subcutaneous  TID WC   midodrine  10 mg Oral TID WC   rosuvastatin  20 mg Oral Daily   thiamine  100 mg Oral Daily   Continuous Infusions:  meropenem (MERREM) IV 1 g (01/29/22 0829)   PRN Meds:.acetaminophen **OR** acetaminophen, melatonin, polyethylene glycol No Known Allergies  OBJECTIVE: Blood pressure 107/75, pulse 90, temperature 97.6 F (36.4 C), temperature source Oral, resp. rate 16, height _0  (1.702 m), weight 44.8 kg, SpO2 100 %.  Physical Exam Constitutional:      General: He is not in acute distress.    Appearance: He is not toxic-appearing.     Comments: frail  HENT:     Head: Normocephalic and atraumatic.     Right Ear: External ear normal.     Left Ear: External ear normal.     Nose: No congestion or rhinorrhea.     Mouth/Throat:     Mouth: Mucous membranes are moist.     Pharynx: Oropharynx is clear.  Eyes:     Extraocular Movements: Extraocular movements intact.     Conjunctiva/sclera: Conjunctivae normal.     Pupils: Pupils are equal, round, and reactive to light.  Cardiovascular:     Rate and Rhythm: Normal rate and regular rhythm.     Heart sounds: No murmur heard.    No friction rub. No gallop.  Pulmonary:     Effort: Pulmonary effort is normal.     Breath sounds: Normal breath sounds.  Abdominal:     General: Abdomen is flat. Bowel sounds are normal.     Palpations: Abdomen is soft.  Musculoskeletal:        General: No swelling. Normal range of motion.     Cervical back: Normal range of motion and neck supple.  Skin:    General: Skin is warm and dry.  Neurological:     General: No focal deficit present.     Mental Status: He is oriented to person, place, and time.  Psychiatric:        Mood and Affect: Mood normal.     Lab Results Lab Results  Component Value Date   WBC 9.2 01/28/2022   HGB 10.0 (L) 01/28/2022   HCT 31.2 (L) 01/28/2022   MCV 85.7 01/28/2022   PLT 277 01/28/2022    Lab Results  Component Value Date   CREATININE 0.54 (L)  01/28/2022   BUN 10 01/28/2022   NA 133 (L) 01/28/2022   K 3.9 01/28/2022   CL 101 01/28/2022   CO2 26 01/28/2022    Lab Results  Component Value Date   ALT 26 01/28/2022   AST 29 01/28/2022   ALKPHOS 87 01/28/2022   BILITOT 0.4 01/28/2022       Charles [name]  Candiss Norse, [facility] 01/29/2022, 9:42 AM

## 2022-01-29 NOTE — Evaluation (Signed)
Occupational Therapy Evaluation Patient Details Name: Charles [name] MRN: [id number] DOB: February 17, 1961 Today's Date: 01/29/2022   History of Present Illness Charles [name] is a 61 year old male admitted with UTI, sepsis. Hx of chronic foley, CHF, dementia, DM, anemia, malnutrition. Pt is a LTC resident at a SNF.   Clinical Impression   The patient required min assist for rolling left and right in bed, min assist for supine to sit, max assist for simulated lower body dressing, and mod assist x2 to squat-pivot to the bedside chair. He reported a history of baseline R LE weakness and difficulty controlling motor movements. He was further noted to be with deconditioning, sitting balance insecurity seated EOB, generalized strength deficits, impaired functional mobility, and impaired ADL performance.  He will benefit from further OT services to facilitate improved ADL performance and to decrease the risk for restricted ADL participation.      Recommendations for follow up therapy are one component of a multi-disciplinary discharge planning process, led by the attending physician.  Recommendations may be updated based on patient status, additional functional criteria and insurance authorization.   Follow Up Recommendations  Skilled nursing-short term rehab (<3 hours/day)     Assistance Recommended at Discharge Frequent or constant Supervision/Assistance  Patient can return home with the following Direct supervision/assist for medications management;A lot of help with walking and/or transfers;A little help with bathing/dressing/bathroom    Functional Status Assessment  Patient has had a recent decline in their functional status and demonstrates the ability to make significant improvements in function in a reasonable and predictable amount of time.  Equipment Recommendations  None recommended by OT       Precautions / Restrictions Precautions Precautions: Fall Restrictions Weight Bearing  Restrictions: No      Mobility Bed Mobility Overal bed mobility: Needs Assistance Bed Mobility: Supine to Sit, Rolling Rolling: Min assist   Supine to sit: Min assist          Transfers Overall transfer level: Needs assistance   Transfers: Bed to chair/wheelchair/BSC            Lateral/Scoot Transfers: Mod assist, +2 physical assistance General transfer comment: He required instruction on reaching for the arm of the chair, pushing with his B LE, and increasing his B LE base of support      Balance Overall balance assessment: Needs assistance   Sitting balance-Leahy Scale: Fair         ADL either performed or assessed with clinical judgement   ADL Overall ADL's : Needs assistance/impaired Eating/Feeding: Set up;Sitting Eating/Feeding Details (indicate cue type and reason): based on clinical judgement Grooming: Set up;Sitting Grooming Details (indicate cue type and reason): simulated at chair level         Upper Body Dressing : Moderate assistance Upper Body Dressing Details (indicate cue type and reason): simulated at bed level Lower Body Dressing: Maximal assistance       Toileting- Clothing Manipulation and Hygiene: Bed level               Vision Patient Visual Report: No change from baseline Additional Comments: He correctly read the time depicted on the wall clock            Pertinent Vitals/Pain Pain Assessment Pain Assessment: No/denies pain     Hand Dominance Right   Extremity/Trunk Assessment Upper Extremity Assessment Upper Extremity Assessment: RUE deficits/detail;LUE deficits/detail RUE Deficits / Details: AROM WFL. Grip strength 4/5 LUE Deficits / Details: AROM WFL. Grip strength 4/5  Lower Extremity Assessment Lower Extremity Assessment: LLE deficits/detail RLE Deficits / Details: he reports a history of baseline weakness with difficulty controlling motor movements LLE Deficits / Details: AROM WFL       Communication  Communication Communication: Expressive difficulties   Cognition Arousal/Alertness: Awake/alert Behavior During Therapy: WFL for tasks assessed/performed Overall Cognitive Status: History of cognitive impairments - at baseline (medical records indicate a history of dementia)            General Comments: Cooperative, able to follow 1 step commands, oriented to person, place, and time.         Home Living Family/patient expects to be discharged to:: Skilled nursing facility     Type of Home: Skilled Nursing Facility          Additional Comments: Has been at a SNF for ~6 months. He lived at home alone and was independent prior to 6 months ago.      Prior Functioning/Environment Prior Level of Function : Needs assist             Mobility Comments: He reported being mostly in the bed for the past few weeks, as therapy stopped working with him. ADLs Comments: Required assist from staff for toileting, bathing, and dressing at bed level. He could feed himself independently.        OT Problem List: Decreased strength;Decreased range of motion;Decreased activity tolerance;Impaired balance (sitting and/or standing);Decreased coordination;Decreased cognition;Decreased knowledge of use of DME or AE      OT Treatment/Interventions: Self-care/ADL training;Therapeutic exercise;Therapeutic activities;Cognitive remediation/compensation;Neuromuscular education;Energy conservation;DME and/or AE instruction;Patient/family education;Balance training    OT Goals(Current goals can be found in the care plan section) Acute Rehab OT Goals Patient Stated Goal: To eventually return home OT Goal Formulation: With patient Time For Goal Achievement: 02/12/22 Potential to Achieve Goals: Fair ADL Goals Pt Will Perform Grooming: with set-up;sitting Pt Will Perform Upper Body Bathing: with set-up;sitting Pt Will Perform Upper Body Dressing: with set-up;sitting Pt Will Transfer to Toilet: with min  assist;squat pivot transfer;bedside commode  OT Frequency: Min 2X/week       AM-PAC OT "6 Clicks" Daily Activity     Outcome Measure Help from another person eating meals?: None Help from another person taking care of personal grooming?: A Little Help from another person toileting, which includes using toliet, bedpan, or urinal?: A Lot Help from another person bathing (including washing, rinsing, drying)?: A Lot Help from another person to put on and taking off regular upper body clothing?: A Lot Help from another person to put on and taking off regular lower body clothing?: Total 6 Click Score: 14   End of Session Nurse Communication: Mobility status (nurse tech updated)  Activity Tolerance: Patient tolerated treatment well Patient left: in chair;with call bell/phone within reach;Other (comment) (nurse techs present)  OT Visit Diagnosis: Muscle weakness (generalized) (M62.81)                Time: 2563-8937 OT Time Calculation (min): 22 min Charges:  OT General Charges $OT Visit: 1 Visit OT Evaluation $OT Eval Moderate Complexity: 1 Mod    Zackariah Vanderpol L Erby Sanderson, OTR/L 01/29/2022, 2:21 PM

## 2022-01-29 NOTE — TOC Progression Note (Signed)
Transition of Care [facility]) - Progression Note    Patient Details  Name: Neythan Kozlov MRN: [id number] Date of Birth: May 11, 1960  Transition of Care [facility] CM/SW Contact  Princella Ion, LCSW Phone Number: 01/29/2022, 1:59 PM  Clinical Narrative:    Irving Burton, admissions rep, at [facility] reported the pt could return today If d/c. Dr. Claire Shown is uncertain if pt will d/c today due to needing Picc line and unsure how soon it will be placed. If Picc line is placed at decent time, pt can d/c back today. If not, pt should return back to [facility] on 11/20 if medically stable.    Expected Discharge Plan: Skilled Nursing Facility Barriers to Discharge: Continued Medical Work up  Expected Discharge Plan and Services Expected Discharge Plan: Skilled Nursing Facility In-house Referral: NA Discharge Planning Services: NA Post Acute Care Choice: Resumption of Svcs/PTA Provider, Skilled Nursing Facility, Hospice Living arrangements for the past 2 months: Assisted Living Facility, Skilled Nursing Facility                 DME Arranged: N/A DME Agency: NA                   Social Determinants of Health (SDOH) Interventions    Readmission Risk Interventions    01/27/2022   10:32 AM 07/04/2021    2:49 PM  Readmission Risk Prevention Plan  Transportation Screening Complete Complete  PCP or Specialist Appt within 5-7 Days  Complete  Home Care Screening  Complete  Medication Review (RN Care Manager) Complete   PCP or Specialist appointment within 3-5 days of discharge Complete   HRI or Home Care Consult Complete   SW Recovery Care/Counseling Consult Complete   Palliative Care Screening Complete   Skilled Nursing Facility Complete

## 2022-01-29 NOTE — Progress Notes (Signed)
Peripherally Inserted Central Catheter Placement  The IV Nurse has discussed with the patient and/or persons authorized to consent for the patient, the purpose of this procedure and the potential benefits and risks involved with this procedure.  The benefits include less needle sticks, lab draws from the catheter, and the patient may be discharged home with the catheter. Risks include, but not limited to, infection, bleeding, blood clot (thrombus formation), and puncture of an artery; nerve damage and irregular heartbeat and possibility to perform a PICC exchange if needed/ordered by physician.  Alternatives to this procedure were also discussed.  Bard Power PICC patient education guide, fact sheet on infection prevention and patient information card has been provided to patient /or left at bedside.    PICC Placement Documentation  PICC Single Lumen 01/29/22 Right Basilic 37 cm 0 cm (Active)  Indication for Insertion or Continuance of Line Home intravenous therapies (PICC only) 01/29/22 1810  Exposed Catheter (cm) 0 cm 01/29/22 1810  Site Assessment Clean, Dry, Intact 01/29/22 1810  Line Status Saline locked;Flushed;Blood return noted 01/29/22 1810  Dressing Type Transparent;Securing device 01/29/22 1810  Dressing Status Antimicrobial disc in place;Clean, Dry, Intact 01/29/22 1810  Safety Lock Not Applicable 01/29/22 1810  Line Care Connections checked and tightened 01/29/22 1810  Line Adjustment (NICU/IV Team Only) No 01/29/22 1810  Dressing Intervention New dressing 01/29/22 1810  Dressing Change Due 02/05/22 01/29/22 1810       Burnard Bunting Chenice 01/29/2022, 6:12 PM

## 2022-01-29 NOTE — Progress Notes (Signed)
PROGRESS NOTE  Charles [name]  TDS:[id number] DOB: Aug 17, 1960 DOA: 01/26/2022 PCP: Pcp, No   Brief Narrative:  61 year old gentleman with progressive neurological decline, type 2 diabetes, anemia of chronic disease, chronic urine retention and multiple failed voiding trials and recent UTI brought to the emergency room due to patient being more confused.  He is a long-term nursing home resident, he was found to be more confused from his baseline.  In the emergency room temperature 101, heart rate 97, blood pressure stable.  Urine was grossly abnormal.  Suspected to have UTI and was started on broad-spectrum antibiotics.  Both urine culture and blood cultures now showing Providencia Rettgeri.  ID following, plan for treating with ertapenem for 10 days, waiting for PICC line  Assessment & Plan:    Acute UTI present on admission secondary to indwelling Foley catheter/Sepsis present on admission secondary to UTI/ Acute metabolic encephalopathy secondary to UTI: Has chronic indwelling Foley catheter Foley catheter exchanged 10/16.    Currently remains on meropenem. Clinically improving.  Sepsis physiology is improving. Previously failed voiding trials, we recommend outpatient urology follow-up.  Encephalopathy has resolved. Both urine culture and blood cultures now showing Providencia Rettgeri.  ID following, plan for treating with ertapenem for 10 days, waiting for PICC line   Type 2 diabetes: Poor appetite.  Keep on sliding scale insulin.  New A1c of 7.7. hyperglycemic so added long-acting insulin today   Chronic medical issues: Chronic diastolic heart failure, well compensated  Hyperlipidemia: On rosuvastatin  H/O chronic hypotension: on midodrine  Anemia of chronic disease:  stable   Progressive neurological decline: Extensively investigated with no cause found.  Followed by outpatient neurology.  Continue to work with PT OT.Lives in nursing facility.  Has not ambulated for last 3  months because of weakness  Severe protein calorie monitor: BMI of 15.4.  Nutritionist following       Nutrition Problem: Severe Malnutrition Etiology: chronic illness    DVT prophylaxis:SCDs Start: 01/26/22 2158     Code Status: DNR  Family Communication: None at bedside  Patient status:Inpatient  Patient is from :SNF  Anticipated discharge to:SNF  Estimated DC date:as soon as picc line is placed   Consultants: ID  Procedures:None  Antimicrobials:  Anti-infectives (From admission, onward)    Start     Dose/Rate Route Frequency Ordered Stop   01/27/22 1600  meropenem (MERREM) 1 g in sodium chloride 0.9 % 100 mL IVPB        1 g 200 mL/hr over 30 Minutes Intravenous Every 8 hours 01/27/22 1521     01/26/22 2130  piperacillin-tazobactam (ZOSYN) IVPB 3.375 g  Status:  Discontinued        3.375 g 12.5 mL/hr over 240 Minutes Intravenous Every 8 hours 01/26/22 2118 01/27/22 1521       Subjective: Patient seen and examined at the bedside this afternoon.  Hemodynamically stable.  Sitting in the chair.  Complains of weakness.  Looks with deconditioned, weak.  Mental status has returned to baseline and is currently alert and oriented.  Denies any abdomen pain, nausea or vomiting.  Objective: Vitals:   01/28/22 2005 01/29/22 0455 01/29/22 0500 01/29/22 1346  BP: (!) 145/89 107/75  114/60  Pulse: 85 90  89  Resp: 18 16  16   Temp: 97.9 F (36.6 C) 97.6 F (36.4 C)  (!) 97.4 F (36.3 C)  TempSrc: Oral Oral  Oral  SpO2: 100% 100%  100%  Weight:   44.8 kg   Height:  Intake/Output Summary (Last 24 hours) at 01/29/2022 1435 Last data filed at 01/29/2022 0920 Gross per 24 hour  Intake 1162 ml  Output 2675 ml  Net -1513 ml   Filed Weights   01/27/22 0405 01/28/22 0500 01/29/22 0500  Weight: 41 kg 46.2 kg 44.8 kg    Examination:  General exam: Overall comfortable, not in distress, appears weak, chronically ill, severely malnourished HEENT:  PERRL Respiratory system:  no wheezes or crackles  Cardiovascular system: S1 & S2 heard, RRR.  Gastrointestinal system: Abdomen is nondistended, soft and nontender. Central nervous system: Alert and oriented Extremities: No edema, no clubbing ,no cyanosis Skin: No rashes, no ulcers,no icterus   GU: Foley   Data Reviewed: I have personally reviewed following labs and imaging studies  CBC: Recent Labs  Lab 01/26/22 1718 01/27/22 0532 01/28/22 0537  WBC 13.1* 10.9* 9.2  NEUTROABS 11.1* 8.2* 6.2  HGB 10.5* 9.4* 10.0*  HCT 32.6* 29.1* 31.2*  MCV 86.5 86.1 85.7  PLT 314 236 277   Basic Metabolic Panel: Recent Labs  Lab 01/26/22 1718 01/27/22 0532 01/27/22 0533 01/28/22 0537  NA 135 133*  --  133*  K 4.4 3.9  --  3.9  CL 100 102  --  101  CO2 28 27  --  26  GLUCOSE 193* 214*  --  273*  BUN 10 9  --  10  CREATININE 0.66 0.66  --  0.54*  CALCIUM 8.9 8.5*  --  8.7*  MG  --  1.6* 1.7 1.7  PHOS  --   --   --  3.0     Recent Results (from the past 240 hour(s))  Blood Culture (routine x 2)     Status: Abnormal   Collection Time: 01/26/22  5:18 PM   Specimen: BLOOD  Result Value Ref Range Status   Specimen Description   Final    BLOOD RIGHT ANTECUBITAL Performed at [facility], [address], [place], [place] [address]    Special Requests   Final    BOTTLES DRAWN AEROBIC AND ANAEROBIC Blood Culture adequate volume Performed at [facility], [address], [place], [place] [address]    Culture  Setup Time   Final    GRAM NEGATIVE RODS ANAEROBIC BOTTLE ONLY CRITICAL RESULT CALLED TO, READ BACK BY AND VERIFIED WITH: PHARMD C.SHEAD AT 1434 ON 01/27/2022 BY T.SAAD. Performed at [facility], [address], [place], [place] [address]    Culture PROVIDENCIA RETTGERI (A)  Final   Report Status 01/29/2022 FINAL  Final   Organism ID, Bacteria PROVIDENCIA RETTGERI  Final      Susceptibility   Providencia rettgeri - MIC*     AMPICILLIN >=32 RESISTANT Resistant     CEFAZOLIN >=64 RESISTANT Resistant     CEFEPIME 4 INTERMEDIATE Intermediate     CEFTAZIDIME 4 SENSITIVE Sensitive     CIPROFLOXACIN >=4 RESISTANT Resistant     GENTAMICIN <=1 SENSITIVE Sensitive     IMIPENEM 1 SENSITIVE Sensitive     TRIMETH/SULFA >=320 RESISTANT Resistant     AMPICILLIN/SULBACTAM >=32 RESISTANT Resistant     PIP/TAZO <=4 SENSITIVE Sensitive     * PROVIDENCIA RETTGERI  Blood Culture ID Panel (Reflexed)     Status: Abnormal   Collection Time: 01/26/22  5:18 PM  Result Value Ref Range Status   Enterococcus faecalis NOT DETECTED NOT DETECTED Final   Enterococcus Faecium NOT DETECTED NOT DETECTED Final   Listeria monocytogenes NOT DETECTED NOT  DETECTED Final   Staphylococcus species NOT DETECTED NOT DETECTED Final   Staphylococcus aureus (BCID) NOT DETECTED NOT DETECTED Final   Staphylococcus epidermidis NOT DETECTED NOT DETECTED Final   Staphylococcus lugdunensis NOT DETECTED NOT DETECTED Final   Streptococcus species NOT DETECTED NOT DETECTED Final   Streptococcus agalactiae NOT DETECTED NOT DETECTED Final   Streptococcus pneumoniae NOT DETECTED NOT DETECTED Final   Streptococcus pyogenes NOT DETECTED NOT DETECTED Final   A.calcoaceticus-baumannii NOT DETECTED NOT DETECTED Final   Bacteroides fragilis NOT DETECTED NOT DETECTED Final   Enterobacterales DETECTED (A) NOT DETECTED Final    Comment: Enterobacterales represent a large order of gram negative bacteria, not a single organism. Refer to culture for further identification. CRITICAL RESULT CALLED TO, READ BACK BY AND VERIFIED WITH: PHARMD C.SHEAD AT 1434 ON 01/27/2022 BY T.SAAD.    Enterobacter cloacae complex NOT DETECTED NOT DETECTED Final   Escherichia coli NOT DETECTED NOT DETECTED Final   Klebsiella aerogenes NOT DETECTED NOT DETECTED Final   Klebsiella oxytoca NOT DETECTED NOT DETECTED Final   Klebsiella pneumoniae NOT DETECTED NOT DETECTED Final   Proteus species  NOT DETECTED NOT DETECTED Final   Salmonella species NOT DETECTED NOT DETECTED Final   Serratia marcescens NOT DETECTED NOT DETECTED Final   Haemophilus influenzae NOT DETECTED NOT DETECTED Final   Neisseria meningitidis NOT DETECTED NOT DETECTED Final   Pseudomonas aeruginosa NOT DETECTED NOT DETECTED Final   Stenotrophomonas maltophilia NOT DETECTED NOT DETECTED Final   Candida albicans NOT DETECTED NOT DETECTED Final   Candida auris NOT DETECTED NOT DETECTED Final   Candida glabrata NOT DETECTED NOT DETECTED Final   Candida krusei NOT DETECTED NOT DETECTED Final   Candida parapsilosis NOT DETECTED NOT DETECTED Final   Candida tropicalis NOT DETECTED NOT DETECTED Final   Cryptococcus neoformans/gattii NOT DETECTED NOT DETECTED Final   CTX-M ESBL DETECTED (A) NOT DETECTED Final    Comment: CRITICAL RESULT CALLED TO, READ BACK BY AND VERIFIED WITH: PHARMD C.SHEAD AT 1434 ON 01/27/2022 BY T.SAAD. (NOTE) Extended spectrum beta-lactamase detected. Recommend a carbapenem as initial therapy.      Carbapenem resistance IMP NOT DETECTED NOT DETECTED Final   Carbapenem resistance KPC NOT DETECTED NOT DETECTED Final   Carbapenem resistance NDM NOT DETECTED NOT DETECTED Final   Carbapenem resist OXA 48 LIKE NOT DETECTED NOT DETECTED Final   Carbapenem resistance VIM NOT DETECTED NOT DETECTED Final    Comment: Performed at [facility], [address] [place], [place] [address]  Blood Culture (routine x 2)     Status: None (Preliminary result)   Collection Time: 01/26/22  5:58 PM   Specimen: BLOOD  Result Value Ref Range Status   Specimen Description   Final    BLOOD SITE NOT SPECIFIED Performed at [facility], [address] [place], [place] [address]    Special Requests   Final    BOTTLES DRAWN AEROBIC AND ANAEROBIC Blood Culture adequate volume Performed at [facility], [address] [place], [place] [address]    Culture   Final     NO GROWTH 3 DAYS Performed at [facility], [address] [place], [place] [address]    Report Status PENDING  Incomplete  Urine Culture     Status: Abnormal (Preliminary result)   Collection Time: 01/26/22  8:31 PM   Specimen: In/Out Cath Urine  Result Value Ref Range Status   Specimen Description   Final    IN/OUT CATH  URINE Performed at [facility], [address] [place], [place] [address]    Special Requests   Final    NONE Performed at [facility], [address], [place], [place] [address]    Culture (A)  Final    >=100,000 COLONIES/mL PROVIDENCIA RETTGERI >=100,000 COLONIES/mL ENTEROCOCCUS FAECALIS SUSCEPTIBILITIES TO FOLLOW Performed at [facility], [address], [place], [place] [address]    Report Status PENDING  Incomplete  Resp Panel by RT-PCR (Flu A&B, Covid) Anterior Nasal Swab     Status: None   Collection Time: 01/26/22  8:31 PM   Specimen: Anterior Nasal Swab  Result Value Ref Range Status   SARS Coronavirus 2 by RT PCR NEGATIVE NEGATIVE Final    Comment: (NOTE) SARS-CoV-2 target nucleic acids are NOT DETECTED.  The SARS-CoV-2 RNA is generally detectable in upper respiratory specimens during the acute phase of infection. The lowest concentration of SARS-CoV-2 viral copies this assay can detect is 138 copies/mL. A negative result does not preclude SARS-Cov-2 infection and should not be used as the sole basis for treatment or other patient management decisions. A negative result may occur with  improper specimen collection/handling, submission of specimen other than nasopharyngeal swab, presence of viral mutation(s) within the areas targeted by this assay, and inadequate number of viral copies(<138 copies/mL). A negative result must be combined with clinical observations, patient history, and epidemiological information. The expected result is Negative.  Fact Sheet for Patients:   BloggerCourse.com  Fact Sheet for Healthcare Providers:  SeriousBroker.it  This test is no t yet approved or cleared by the [place] FDA and  has been authorized for detection and/or diagnosis of SARS-CoV-2 by FDA under an Emergency Use Authorization (EUA). This EUA will remain  in effect (meaning this test can be used) for the duration of the COVID-19 declaration under Section 564(b)(1) of the Act, 21 U.S.C.section 360bbb-3(b)(1), unless the authorization is terminated  or revoked sooner.       Influenza A by PCR NEGATIVE NEGATIVE Final   Influenza B by PCR NEGATIVE NEGATIVE Final    Comment: (NOTE) The Xpert Xpress SARS-CoV-2/FLU/RSV plus assay is intended as an aid in the diagnosis of influenza from Nasopharyngeal swab specimens and should not be used as a sole basis for treatment. Nasal washings and aspirates are unacceptable for Xpert Xpress SARS-CoV-2/FLU/RSV testing.  Fact Sheet for Patients: BloggerCourse.com  Fact Sheet for Healthcare Providers: SeriousBroker.it  This test is not yet approved or cleared by the [place] FDA and has been authorized for detection and/or diagnosis of SARS-CoV-2 by FDA under an Emergency Use Authorization (EUA). This EUA will remain in effect (meaning this test can be used) for the duration of the COVID-19 declaration under Section 564(b)(1) of the Act, 21 U.S.C. section 360bbb-3(b)(1), unless the authorization is terminated or revoked.  Performed at [facility], [address] [place], [place] [address]   MRSA Next Gen by PCR, Nasal     Status: Abnormal   Collection Time: 01/27/22  1:20 AM  Result Value Ref Range Status   MRSA by PCR Next Gen DETECTED (A) NOT DETECTED Final    Comment: (NOTE) The GeneXpert MRSA Assay (FDA approved for NASAL specimens only), is one component of a comprehensive MRSA  colonization surveillance program. It is not intended to diagnose MRSA infection nor to guide or monitor treatment for MRSA infections. Test performance is not FDA approved in patients less than 39 years old. Performed at  [facility], [address] [place], [place] [address]      Radiology Studies: [place] EKG SITE RITE  Result Date: 01/29/2022 If Site Rite image not attached, placement could not be confirmed due to current cardiac rhythm.   Scheduled Meds:  aspirin EC  81 mg Oral Daily   Chlorhexidine Gluconate Cloth  6 each Topical Daily   docusate sodium  100 mg Oral QHS   dronabinol  5 mg Oral BID AC   ezetimibe  10 mg Oral Daily   feeding supplement (GLUCERNA SHAKE)  237 mL Oral TID BM   insulin aspart  0-6 Units Subcutaneous TID WC   insulin glargine-yfgn  10 Units Subcutaneous Daily   midodrine  10 mg Oral TID WC   rosuvastatin  20 mg Oral Daily   thiamine  100 mg Oral Daily   Continuous Infusions:  meropenem (MERREM) IV 1 g (01/29/22 0829)     LOS: 3 days   Burnadette Pop, MD [facility] P11/19/2023, 2:35 PM

## 2022-01-30 DIAGNOSIS — N3000 Acute cystitis without hematuria: Secondary | ICD-10-CM | POA: Diagnosis not present

## 2022-01-30 LAB — URINE CULTURE: Culture: >=100000 — AB

## 2022-01-30 LAB — BASIC METABOLIC PANEL
Anion gap: 7 (ref 5–15)
BUN: 12 mg/dL (ref 6–20)
CO2: 26 mmol/L (ref 22–32)
Calcium: 8.9 mg/dL (ref 8.9–10.3)
Chloride: 100 mmol/L (ref 98–111)
Creatinine, Ser: 0.54 mg/dL — ABNORMAL LOW (ref 0.61–1.24)
GFR, Estimated: >60 mL/min (ref >60)
Glucose, Bld: 332 mg/dL — ABNORMAL HIGH (ref 70–99)
Potassium: 4.6 mmol/L (ref 3.5–5.1)
Sodium: 133 mmol/L — ABNORMAL LOW (ref 135–145)

## 2022-01-30 LAB — GLUCOSE, CAPILLARY
Glucose-Capillary: 218 mg/dL — ABNORMAL HIGH (ref 70–99)
Glucose-Capillary: 344 mg/dL — ABNORMAL HIGH (ref 70–99)

## 2022-01-30 MED ORDER — ERTAPENEM IV (FOR PTA / DISCHARGE USE ONLY): 1.0000 g | INTRAVENOUS | 0 refills | Status: AC

## 2022-01-30 MED ORDER — INSULIN GLARGINE-YFGN 100 UNIT/ML ~~LOC~~ SOLN: 18.0000 [IU] | Freq: Every day | SUBCUTANEOUS | 11 refills | Status: AC

## 2022-01-30 NOTE — TOC Transition Note (Signed)
Transition of Care [facility]) - CM/SW Discharge Note   Patient Details  Name: Charles [name] MRN: [id number] Date of Birth: 1961-02-24  Transition of Care [facility]) CM/SW Contact:  [name], LCSW Phone Number: 01/30/2022, 1:20 PM   Clinical Narrative:    Pt is to return to [facility] for LTC SNF. Pt will be going to room 138A. RN to call report to [phone]. Pt will be transported to facility via [facility]. Spoke with pt's sister and confirmed discharge plans.    Final next level of care: Skilled Nursing Facility Barriers to Discharge: Barriers Resolved   Patient Goals and CMS Choice Patient states their goals for this hospitalization and ongoing recovery are:: For [facility] to [facility] to [facility].gov Compare Post Acute Care list provided to:: Patient Choice offered to / list presented to : Patient, Sibling  Discharge Placement   Existing PASRR number confirmed : 01/30/22          Patient chooses bed at: Other - please specify in the comment section below: [facility]) Patient to be transferred to facility by: [facility] Name of family member notified: Danise Mina Patient and family notified of of transfer: 01/30/22  Discharge Plan and Services In-house Referral: NA Discharge Planning Services: NA Post Acute Care Choice: Resumption of Svcs/PTA Provider, Skilled Nursing Facility, Hospice          DME Arranged: N/A DME Agency: NA                  Social Determinants of Health (SDOH) Interventions     Readmission Risk Interventions    01/30/2022    1:19 PM 01/27/2022   10:32 AM 07/04/2021    2:49 PM  Readmission Risk Prevention Plan  Transportation Screening Complete Complete Complete  PCP or Specialist Appt within 5-7 Days   Complete  Home Care Screening   Complete  Medication Review (RN Care Manager) Complete Complete   PCP or Specialist appointment within 3-5 days of discharge Complete Complete   HRI or Home Care Consult Complete Complete   SW  Recovery Care/Counseling Consult Complete Complete   Palliative Care Screening Patient Refused Complete   Skilled Nursing Facility Complete Complete

## 2022-01-30 NOTE — [place] FL2 (Signed)
[place] MEDICAID FL2 LEVEL OF CARE SCREENING TOOL     IDENTIFICATION  Patient Name: Charles [name] Birthdate: 01/07/1961 Sex: male Admission Date (Current Location): 01/26/2022  [facility] and [place] Number:  Producer, television/film/video and Address:  [facility],  [address] [place], [place] [address]      Provider Number: [id number]  Attending Physician Name and Address:  Burnadette Pop, MD  Relative Name and Phone Number:  Maxine Glenn (sister) -[phone]    Current Level of Care: Hospital Recommended Level of Care: Skilled Nursing Facility Prior Approval Number:    Date Approved/Denied:   PASRR Number: [id number]  Discharge Plan: SNF    Current Diagnoses: Patient Active Problem List   Diagnosis Date Noted   UTI (urinary tract infection) 01/26/2022   Chronic diastolic heart failure (HCC) 01/26/2022   History of orthostatic hypotension 01/26/2022   Dementia (HCC) 11/30/2021   Pressure injury of sacral region, stage 2 (HCC) 11/30/2021   Multiple fractures of ribs, left side, initial encounter for closed fracture 11/29/2021   Failure to thrive in adult 11/29/2021   Anemia of chronic disease 09/10/2021   Catheter-associated urinary tract infection (HCC) 08/27/2021   Goals of care, counseling/discussion 08/26/2021   Delirium 08/23/2021   Confusion 08/23/2021   Unintentional weight loss of more than 10% body weight within 6 months 08/21/2021   Hemorrhoids 08/21/2021   Diverticulosis of colon 08/21/2021   Solitary rectal ulcer 08/21/2021   Polyp of ascending colon 08/21/2021   Hypokalemia 08/21/2021   Acute blood loss anemia 08/20/2021   Chronic orthostatic hypotension 08/20/2021   Protein-calorie malnutrition, severe (HCC) 08/20/2021   Hypomagnesemia 08/20/2021   Physical deconditioning 08/20/2021   Acute lower GI bleeding 08/19/2021   Pressure injury of skin 07/02/2021   Severe sepsis (HCC) 06/30/2021   Hypothermia 06/30/2021   Urinary retention 04/23/2021    Acute prerenal azotemia 04/22/2021   Leukocytosis 04/22/2021   HLD (hyperlipidemia) 04/22/2021   Acute metabolic encephalopathy 04/22/2021   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/23/2021   DM2 (diabetes mellitus, type 2) (HCC) 03/23/2021   BPH (benign prostatic hyperplasia) 02/08/2021   Phimosis 01/27/2021   Hypertension 12/23/2020   CAD S/p remote CABG 12/23/2020    Orientation RESPIRATION BLADDER Height & Weight     Self, Time, Situation, Place  Normal Incontinent, External catheter Weight: 94 lb 2.2 oz (42.7 kg) Height:  5\' 7"  (170.2 cm)  BEHAVIORAL SYMPTOMS/MOOD NEUROLOGICAL BOWEL NUTRITION STATUS      Incontinent Diet (Regular)  AMBULATORY STATUS COMMUNICATION OF NEEDS Skin   Total Care Verbally Normal                       Personal Care Assistance Level of Assistance  Bathing, Feeding, Dressing Bathing Assistance: Maximum assistance Feeding assistance: Limited assistance Dressing Assistance: Maximum assistance     Functional Limitations Info  Sight, Hearing, Speech Sight Info: Adequate Hearing Info: Adequate Speech Info: Adequate    SPECIAL CARE FACTORS FREQUENCY  PT (By licensed PT), OT (By licensed OT)     PT Frequency: 5x/wk OT Frequency: 5x/wk            Contractures Contractures Info: Not present    Additional Factors Info  Code Status, Allergies Code Status Info: DNR Allergies Info: No Known Allergies Psychotropic Info: None         Current Medications (01/30/2022):  This is the current hospital active medication list Current Facility-Administered Medications  Medication Dose Route Frequency Provider Last Rate Last Admin  acetaminophen (TYLENOL) tablet 650 mg  650 mg Oral Q6H PRN Howerter, Justin B, DO       Or   acetaminophen (TYLENOL) suppository 650 mg  650 mg Rectal Q6H PRN Howerter, Justin B, DO       aspirin EC tablet 81 mg  81 mg Oral Daily Howerter, Justin B, DO   81 mg at 01/30/22 0910   Chlorhexidine Gluconate Cloth 2 % PADS 6 each  6 each  Topical Daily Dorcas Carrow, MD   6 each at 01/30/22 0912   docusate sodium (COLACE) capsule 100 mg  100 mg Oral QHS Howerter, Justin B, DO       dronabinol (MARINOL) capsule 5 mg  5 mg Oral BID AC Howerter, Justin B, DO   5 mg at 01/30/22 0754   ezetimibe (ZETIA) tablet 10 mg  10 mg Oral Daily Howerter, Justin B, DO   10 mg at 01/30/22 0911   feeding supplement (GLUCERNA SHAKE) (GLUCERNA SHAKE) liquid 237 mL  237 mL Oral TID BM Howerter, Justin B, DO   237 mL at 01/30/22 0913   insulin aspart (novoLOG) injection 0-6 Units  0-6 Units Subcutaneous TID WC Howerter, Justin B, DO   2 Units at 01/30/22 0800   insulin glargine-yfgn (SEMGLEE) injection 10 Units  10 Units Subcutaneous Daily Burnadette Pop, MD   10 Units at 01/30/22 0912   melatonin tablet 3 mg  3 mg Oral QHS PRN Howerter, Justin B, DO       meropenem (MERREM) 1 g in sodium chloride 0.9 % 100 mL IVPB  1 g Intravenous Q8H Ghimire, Lyndel Safe, MD 200 mL/hr at 01/30/22 0757 1 g at 01/30/22 0757   midodrine (PROAMATINE) tablet 10 mg  10 mg Oral TID WC Howerter, Justin B, DO   10 mg at 01/30/22 0753   polyethylene glycol (MIRALAX / GLYCOLAX) packet 17 g  17 g Oral Daily PRN Howerter, Justin B, DO       rosuvastatin (CRESTOR) tablet 20 mg  20 mg Oral Daily Howerter, Justin B, DO   20 mg at 01/30/22 0911   sodium chloride flush (NS) 0.9 % injection 10-40 mL  10-40 mL Intracatheter Q12H Adhikari, Amrit, MD   10 mL at 01/30/22 0912   sodium chloride flush (NS) 0.9 % injection 10-40 mL  10-40 mL Intracatheter PRN Burnadette Pop, MD       thiamine (VITAMIN B1) tablet 100 mg  100 mg Oral Daily Howerter, Justin B, DO   100 mg at 01/30/22 0910     Discharge Medications: Please see discharge summary for a list of discharge medications.  Relevant Imaging Results:  Relevant Lab Results:   Additional Information SSN: [id number]  [name], LCSW

## 2022-01-30 NOTE — Progress Notes (Signed)
PHARMACY CONSULT NOTE FOR:  OUTPATIENT  PARENTERAL ANTIBIOTIC THERAPY (OPAT)  Indication: Providencia rettgeri bacteremia secondary to UTI Regimen: Ertapenem 1gm IV q 24hr End date: 02/04/2022  IV antibiotic discharge orders are pended. To discharging provider:  please sign these orders via discharge navigator,  Select New Orders & click on the button choice - Manage This Unsigned Work.     Thank you for allowing pharmacy to be a part of this patient's care.  Caryl Asp [facility] 01/30/2022, 12:18 PM

## 2022-01-30 NOTE — Inpatient Diabetes Management (Addendum)
Inpatient Diabetes Program Recommendations  AACE/ADA: New Consensus Statement on Inpatient Glycemic Control (2015)  Target Ranges:  Prepandial:   less than 140 mg/dL      Peak postprandial:   less than 180 mg/dL (1-2 hours)      Critically ill patients:  140 - 180 mg/dL   Lab Results  Component Value Date   GLUCAP 218 (H) 01/30/2022   HGBA1C 7.7 (H) 01/27/2022    Review of Glycemic Control  Latest Reference Range & Units 01/29/22 11:34 01/29/22 16:56 01/29/22 21:07 01/30/22 07:52  Glucose-Capillary 70 - 99 mg/dL 098 (H) 119 (H) 147 (H) 218 (H)  (H): Data is abnormally high Diabetes history: Type 2 Dm Outpatient Diabetes medications: Humalog 2-10 units TID Current orders for Inpatient glycemic control: Novolog 0-6 units TID, Semglee 10 units QD  Inpatient Diabetes Program Recommendations:    If to remain inpatient, consider: -Changing diet to carb modified -Increasing Semglee to 18 units QD  Addendum: Spoke with patient regarding insulin and dosages in preparation for discharge. Patient does not know dosages, however, reports that he on basal insulin and sliding scale. Attempted to call [facility] Lessen place to verify dosages; no answer x 2. Per chart review from previous hospitalization home medications were listed to include Basaglar 14 units QD. At this time would continue with daily recommendation for discharge.   Thanks, Lujean Rave, MSN, RNC-OB Diabetes Coordinator [phone] ([phone])

## 2022-01-30 NOTE — Discharge Summary (Signed)
Physician Discharge Summary  Charles [name] KVQ:[id number] DOB: 1960/08/07 DOA: 01/26/2022  PCP: Merryl Hacker, No  Admit date: 01/26/2022 Discharge date: 01/30/2022  Admitted From: Home Disposition:  Home  Discharge Condition:Stable CODE STATUS:DNR Diet recommendation: Carb Modified   Brief/Interim Summary: 61 year old gentleman with progressive neurological decline, type 2 diabetes, anemia of chronic disease, chronic urine retention and multiple failed voiding trials and recent UTI brought to the emergency room due to patient being more confused.  He is a long-term nursing home resident, he was found to be more confused from his baseline.  In the emergency room temperature 101, heart rate 97, blood pressure stable.  Urine was grossly abnormal.  Suspected to have UTI and was started on broad-spectrum antibiotics.  Both urine culture and blood cultures now showing Providencia Rettgeri.  ID following, plan for treating with ertapenem for 5 more days, underwent PICC line placement.  Following problems were addressed during his hospitalization:  Acute UTI present on admission secondary to indwelling Foley catheter/Sepsis present on admission secondary to UTI/ Acute metabolic encephalopathy secondary to UTI: Has chronic indwelling Foley catheter Foley catheter exchanged 10/16.  Started on meropenem. Clinically improved.  Sepsis physiology has resolved Previously failed voiding trials, we recommend outpatient urology follow-up.  Encephalopathy has resolved. Both urine culture and blood cultures now showing Providencia Rettgeri.  ID following, plan for treating with ertapenem,S/P  PICC line   Type 2 diabetes: Poor appetite.    New A1c of 7.7. hyperglycemic .  Continue sliding scale and long-acting insulin on discharge   Chronic medical issues: Chronic diastolic heart failure, well compensated   Hyperlipidemia: On rosuvastatin   H/O chronic hypotension: on midodrine   Anemia of chronic disease:   stable   Progressive neurological decline: Extensively investigated with no cause found.  Followed by outpatient neurology.  Continue to work with PT OT.Lives in nursing facility.  Has not ambulated for last 3 months because of weakness   Severe protein calorie monitor: BMI of 15.4.  Nutritionist was following      Discharge Diagnoses:  Principal Problem:   UTI (urinary tract infection) Active Problems:   Acute metabolic encephalopathy   DM2 (diabetes mellitus, type 2) (HCC)   HLD (hyperlipidemia)   Severe sepsis (HCC)   Anemia of chronic disease   Chronic diastolic heart failure ([place])   History of orthostatic hypotension    Discharge Instructions  Discharge Instructions     [facility] pharmacist to adjust dose for Vancomycin, Aminoglycosides and other anti-infective therapies as requested by physician.   Complete by: As directed    [facility] to provide Cath Flo 80m   Complete by: As directed    Administer for PICC line occlusion and as ordered by physician for other access device issues.   Anaphylaxis Kit: Provided to treat any anaphylactic reaction to the medication being provided to the patient if First Dose or when requested by physician   Complete by: As directed    Epinephrine 17mml vial / amp: Administer 0.57m54m0.57ml61mubcutaneously once for moderate to severe anaphylaxis, nurse to call physician and pharmacy when reaction occurs and call 911 if needed for immediate care   Diphenhydramine 50mg257mIV vial: Administer 25-50mg 21mM PRN for first dose reaction, rash, itching, mild reaction, nurse to call physician and pharmacy when reaction occurs   Sodium Chloride 0.9% NS 500ml I89mdminister if needed for hypovolemic blood pressure drop or as ordered by physician after call to physician with anaphylactic reaction   Change dressing on  IV access line weekly and PRN   Complete by: As directed    Diet Carb Modified   Complete by: As directed     Discharge instructions   Complete by: As directed    1)Please take prescribed medications as instructed 2)Follow up with urology as an outpatient.  Name and number of the provider group has been attached 3)Monitor your blood sugars   Flush IV access with Sodium Chloride 0.9% and Heparin 10 units/ml or 100 units/ml   Complete by: As directed    Home infusion instructions - [facility]   Complete by: As directed    Instructions: Flush IV access with Sodium Chloride 0.9% and Heparin 10units/ml or 100units/ml   Change dressing on IV access line: Weekly and PRN   Instructions Cath Flo 83m: Administer for PICC Line occlusion and as ordered by physician for other access device   [facility] pharmacist to adjust dose for: Vancomycin, Aminoglycosides and other anti-infective therapies as requested by physician   Increase activity slowly   Complete by: As directed    Insert foley catheter   Complete by: As directed    Method of administration may be changed at the discretion of home infusion pharmacist based upon assessment of the patient and/or caregiver's ability to self-administer the medication ordered   Complete by: As directed       Allergies as of 01/30/2022   No Known Allergies      Medication List     TAKE these medications    aspirin EC 81 MG tablet Take 1 tablet (81 mg total) by mouth daily. Swallow whole.   blood glucose meter kit and supplies Dispense based on patient and insurance preference. Use up to four times daily as directed. (FOR ICD-10 E10.9, E11.9).   docusate sodium 100 MG capsule Commonly known as: COLACE Take 100 mg by mouth at bedtime.   dronabinol 5 MG capsule Commonly known as: MARINOL Take 5 mg by mouth 2 (two) times daily before a meal.   ertapenem  IVPB Commonly known as: INVANZ Inject 1 g into the vein daily for 5 days. Indication:  Providencia rettgeri bacteremia 2nd UTI First Dose: Yes Last Day of Therapy:   02/04/2022 Labs - Once weekly:  CBC/D and BMP, Labs - Every other week:  ESR and CRP Method of administration: Mini-Bag Plus / Gravity Method of administration may be changed at the discretion of home infusion pharmacist based upon assessment of the patient and/or caregiver's ability to self-administer the medication ordered.   ezetimibe 10 MG tablet Commonly known as: ZETIA Take 1 tablet (10 mg total) by mouth daily.   Glucerna Liqd Take 237 mLs by mouth 3 (three) times daily with meals.   hydrocortisone 2.5 % rectal cream Commonly known as: ANUSOL-HC Place rectally 2 (two) times daily.   insulin glargine-yfgn 100 UNIT/ML injection Commonly known as: SEMGLEE Inject 0.18 mLs (18 Units total) into the skin daily. Start taking on: January 31, 2022   insulin lispro 100 UNIT/ML KwikPen Commonly known as: HUMALOG Inject 2-10 Units into the skin See admin instructions. Inject 2-10 units into the skin daily with meals, PER SLIDING SCALE: BGL 140-199 = 2 units; 200-250 = 4 units; 251-299 = 6 units; 300-349 = 8 units; 350+ = 10 units; <70 or >350, CALL MD   loperamide 2 MG capsule Commonly known as: IMODIUM Take 1 capsule (2 mg total) by mouth as needed for diarrhea or loose stools.   magnesium oxide 400 (240  Mg) MG tablet Commonly known as: MAG-OX Take 400 mg by mouth daily.   methenamine 1 g tablet Commonly known as: HIPREX Take 1 g by mouth 2 (two) times daily with a meal.   midodrine 10 MG tablet Commonly known as: PROAMATINE Take 1 tablet (10 mg total) by mouth 3 (three) times daily with meals.   multivitamin with minerals Tabs tablet Take 1 tablet by mouth daily.   NON FORMULARY Take 118 mLs by mouth See admin instructions. Magic Cup desert cup- Eat 1 "cup" (118 ml's) by mouth 2 times a day- with lunch and supper   pantoprazole 40 MG tablet Commonly known as: PROTONIX Take 1 tablet (40 mg total) by mouth daily. Any generic PPI is okay to dispense What changed:  when  to take this additional instructions   polyethylene glycol 17 g packet Commonly known as: MIRALAX / GLYCOLAX Take 17 g by mouth daily.   Preparation H Rapid Relief 5-0.25-14.4-15 % Crea Place 1 Application rectally every 6 (six) hours as needed (hemorrhoids).   rosuvastatin 20 MG tablet Commonly known as: CRESTOR Take 20 mg by mouth daily. What changed: Another medication with the same name was removed. Continue taking this medication, and follow the directions you see here.   thiamine 100 MG tablet Commonly known as: VITAMIN B1 Take 1 tablet (100 mg total) by mouth daily.               Discharge Care Instructions  (From admission, onward)           Start     Ordered   01/30/22 0000  Change dressing on IV access line weekly and PRN  (Home infusion instructions - [facility] )        01/30/22 1310            Follow-up Information     [facility]. Schedule an appointment as soon as possible for a visit in 1 week(s).   Contact information: [place] [phone]               No Known Allergies  Consultations:    Procedures/Studies: [place] EKG SITE RITE  Result Date: 01/29/2022 If Site Rite image not attached, placement could not be confirmed due to current cardiac rhythm.  DG Chest Port 1 View  Result Date: 01/26/2022 CLINICAL DATA:  Sepsis, weakness EXAM: PORTABLE CHEST 1 VIEW COMPARISON:  11/29/2021 FINDINGS: Single frontal view of the chest demonstrates an unremarkable cardiac silhouette. No airspace disease, effusion, or pneumothorax. Subacute healing left posterior fourth rib fracture. No acute bony abnormalities. IMPRESSION: 1. No acute intrathoracic process. Electronically Signed   By: Randa Ngo M.D.   On: 01/26/2022 17:53      Subjective: Patient seen and examined at bedside today.  Remains comfortable without any complaints.  Hemodynamically stable for  discharge  Discharge Exam: Vitals:   01/29/22 2029 01/30/22 0554  BP: 131/85 116/75  Pulse: 86 79  Resp: 16 18  Temp: 97.8 F (36.6 C) (!) 97.3 F (36.3 C)  SpO2: 100% 100%   Vitals:   01/29/22 0500 01/29/22 1346 01/29/22 2029 01/30/22 0554  BP:  114/60 131/85 116/75  Pulse:  89 86 79  Resp:  _0 Temp:  (!) 97.4 F (36.3 C) 97.8 F (36.6 C) (!) 97.3 F (36.3 C)  TempSrc:  Oral Oral Oral  SpO2:  100% 100% 100%  Weight: 44.8 kg   42.7  kg  Height:        General: Pt is alert, awake, not in acute distress, thin built, cachectic Cardiovascular: RRR, S1/S2 +, no rubs, no gallops Respiratory: CTA bilaterally, no wheezing, no rhonchi Abdominal: Soft, NT, ND, bowel sounds + Extremities: no edema, no cyanosis GU: Foley    The results of significant diagnostics from this hospitalization (including imaging, microbiology, ancillary and laboratory) are listed below for reference.     Microbiology: Recent Results (from the past 240 hour(s))  Blood Culture (routine x 2)     Status: Abnormal   Collection Time: 01/26/22  5:18 PM   Specimen: BLOOD  Result Value Ref Range Status   Specimen Description   Final    BLOOD RIGHT ANTECUBITAL Performed at [facility] [address] [place], [place] [address]    Special Requests   Final    BOTTLES DRAWN AEROBIC AND ANAEROBIC Blood Culture adequate volume Performed at [facility] [address] [place], [place] [address]    Culture  Setup Time   Final    GRAM NEGATIVE RODS ANAEROBIC BOTTLE ONLY CRITICAL RESULT CALLED TO, READ BACK BY AND VERIFIED WITH: PHARMD C.SHEAD AT 1434 ON 01/27/2022 BY T.SAAD. Performed at [facility], [place] [address], [place], [place] [address]    Culture PROVIDENCIA RETTGERI (A)  Final   Report Status 01/29/2022 FINAL  Final   Organism ID, Bacteria PROVIDENCIA RETTGERI  Final      Susceptibility   Providencia rettgeri - MIC*    AMPICILLIN >=32  RESISTANT Resistant     CEFAZOLIN >=64 RESISTANT Resistant     CEFEPIME 4 INTERMEDIATE Intermediate     CEFTAZIDIME 4 SENSITIVE Sensitive     CIPROFLOXACIN >=4 RESISTANT Resistant     GENTAMICIN <=1 SENSITIVE Sensitive     IMIPENEM 1 SENSITIVE Sensitive     TRIMETH/SULFA >=320 RESISTANT Resistant     AMPICILLIN/SULBACTAM >=32 RESISTANT Resistant     PIP/TAZO <=4 SENSITIVE Sensitive     * PROVIDENCIA RETTGERI  Blood Culture ID Panel (Reflexed)     Status: Abnormal   Collection Time: 01/26/22  5:18 PM  Result Value Ref Range Status   Enterococcus faecalis NOT DETECTED NOT DETECTED Final   Enterococcus Faecium NOT DETECTED NOT DETECTED Final   Listeria monocytogenes NOT DETECTED NOT DETECTED Final   Staphylococcus species NOT DETECTED NOT DETECTED Final   Staphylococcus aureus (BCID) NOT DETECTED NOT DETECTED Final   Staphylococcus epidermidis NOT DETECTED NOT DETECTED Final   Staphylococcus lugdunensis NOT DETECTED NOT DETECTED Final   Streptococcus species NOT DETECTED NOT DETECTED Final   Streptococcus agalactiae NOT DETECTED NOT DETECTED Final   Streptococcus pneumoniae NOT DETECTED NOT DETECTED Final   Streptococcus pyogenes NOT DETECTED NOT DETECTED Final   A.calcoaceticus-baumannii NOT DETECTED NOT DETECTED Final   Bacteroides fragilis NOT DETECTED NOT DETECTED Final   Enterobacterales DETECTED (A) NOT DETECTED Final    Comment: Enterobacterales represent a large order of gram negative bacteria, not a single organism. Refer to culture for further identification. CRITICAL RESULT CALLED TO, READ BACK BY AND VERIFIED WITH: PHARMD C.SHEAD AT 1434 ON 01/27/2022 BY T.SAAD.    Enterobacter cloacae complex NOT DETECTED NOT DETECTED Final   Escherichia coli NOT DETECTED NOT DETECTED Final   Klebsiella aerogenes NOT DETECTED NOT DETECTED Final   Klebsiella oxytoca NOT DETECTED NOT DETECTED Final   Klebsiella pneumoniae NOT DETECTED NOT DETECTED Final   Proteus species NOT DETECTED NOT  DETECTED Final  Salmonella species NOT DETECTED NOT DETECTED Final   Serratia marcescens NOT DETECTED NOT DETECTED Final   Haemophilus influenzae NOT DETECTED NOT DETECTED Final   Neisseria meningitidis NOT DETECTED NOT DETECTED Final   Pseudomonas aeruginosa NOT DETECTED NOT DETECTED Final   Stenotrophomonas maltophilia NOT DETECTED NOT DETECTED Final   Candida albicans NOT DETECTED NOT DETECTED Final   Candida auris NOT DETECTED NOT DETECTED Final   Candida glabrata NOT DETECTED NOT DETECTED Final   Candida krusei NOT DETECTED NOT DETECTED Final   Candida parapsilosis NOT DETECTED NOT DETECTED Final   Candida tropicalis NOT DETECTED NOT DETECTED Final   Cryptococcus neoformans/gattii NOT DETECTED NOT DETECTED Final   CTX-M ESBL DETECTED (A) NOT DETECTED Final    Comment: CRITICAL RESULT CALLED TO, READ BACK BY AND VERIFIED WITH: PHARMD C.SHEAD AT 1434 ON 01/27/2022 BY T.SAAD. (NOTE) Extended spectrum beta-lactamase detected. Recommend a carbapenem as initial therapy.      Carbapenem resistance IMP NOT DETECTED NOT DETECTED Final   Carbapenem resistance KPC NOT DETECTED NOT DETECTED Final   Carbapenem resistance NDM NOT DETECTED NOT DETECTED Final   Carbapenem resist OXA 48 LIKE NOT DETECTED NOT DETECTED Final   Carbapenem resistance VIM NOT DETECTED NOT DETECTED Final    Comment: Performed at [facility], [place] [address] [place], [place] [address]  Blood Culture (routine x 2)     Status: None (Preliminary result)   Collection Time: 01/26/22  5:58 PM   Specimen: BLOOD  Result Value Ref Range Status   Specimen Description   Final    BLOOD SITE NOT SPECIFIED Performed at [facility] [address], [place], [place] [address]    Special Requests   Final    BOTTLES DRAWN AEROBIC AND ANAEROBIC Blood Culture adequate volume Performed at [facility] [address], [place], [place] [address]    Culture   Final    NO GROWTH 4  DAYS Performed at [facility], [place] [address] [place], [place] [address]    Report Status PENDING  Incomplete  Urine Culture     Status: Abnormal   Collection Time: 01/26/22  8:31 PM   Specimen: In/Out Cath Urine  Result Value Ref Range Status   Specimen Description   Final    IN/OUT CATH URINE Performed at [facility] [address], [place], [place] [address]    Special Requests   Final    NONE Performed at [facility], [place] [address], [place],  [address]    Culture (A)  Final    >=100,000 COLONIES/mL PROVIDENCIA RETTGERI >=100,000 COLONIES/mL ENTEROCOCCUS FAECALIS    Report Status 01/30/2022 FINAL  Final   Organism ID, Bacteria ENTEROCOCCUS FAECALIS (A)  Final   Organism ID, Bacteria PROVIDENCIA RETTGERI (A)  Final      Susceptibility   Enterococcus faecalis - MIC*    AMPICILLIN <=2 SENSITIVE Sensitive     NITROFURANTOIN <=16 SENSITIVE Sensitive     VANCOMYCIN <=0.5 SENSITIVE Sensitive     * >=100,000 COLONIES/mL ENTEROCOCCUS FAECALIS   Providencia rettgeri - MIC*    AMPICILLIN >=32 RESISTANT Resistant     CEFAZOLIN >=64 RESISTANT Resistant     CEFEPIME 2 SENSITIVE Sensitive     CIPROFLOXACIN >=4 RESISTANT Resistant     GENTAMICIN <=1 SENSITIVE Sensitive     IMIPENEM 1 SENSITIVE Sensitive     NITROFURANTOIN 128 RESISTANT Resistant     TRIMETH/SULFA >=320 RESISTANT Resistant  AMPICILLIN/SULBACTAM >=32 RESISTANT Resistant     PIP/TAZO <=4 SENSITIVE Sensitive     * >=100,000 COLONIES/mL PROVIDENCIA RETTGERI  Resp Panel by RT-PCR (Flu A&B, Covid) Anterior Nasal Swab     Status: None   Collection Time: 01/26/22  8:31 PM   Specimen: Anterior Nasal Swab  Result Value Ref Range Status   SARS Coronavirus 2 by RT PCR NEGATIVE NEGATIVE Final    Comment: (NOTE) SARS-CoV-2 target nucleic acids are NOT DETECTED.  The SARS-CoV-2 RNA is generally detectable in upper respiratory specimens during the acute phase of  infection. The lowest concentration of SARS-CoV-2 viral copies this assay can detect is 138 copies/mL. A negative result does not preclude SARS-Cov-2 infection and should not be used as the sole basis for treatment or other patient management decisions. A negative result may occur with  improper specimen collection/handling, submission of specimen other than nasopharyngeal swab, presence of viral mutation(s) within the areas targeted by this assay, and inadequate number of viral copies(<138 copies/mL). A negative result must be combined with clinical observations, patient history, and epidemiological information. The expected result is Negative.  Fact Sheet for Patients:  EntrepreneurPulse.com.au  Fact Sheet for Healthcare Providers:  IncredibleEmployment.be  This test is no t yet approved or cleared by the [place] FDA and  has been authorized for detection and/or diagnosis of SARS-CoV-2 by FDA under an Emergency Use Authorization (EUA). This EUA will remain  in effect (meaning this test can be used) for the duration of the COVID-19 declaration under Section 564(b)(1) of the Act, 21 U.S.C.section 360bbb-3(b)(1), unless the authorization is terminated  or revoked sooner.       Influenza A by PCR NEGATIVE NEGATIVE Final   Influenza B by PCR NEGATIVE NEGATIVE Final    Comment: (NOTE) The Xpert Xpress SARS-CoV-2/FLU/RSV plus assay is intended as an aid in the diagnosis of influenza from Nasopharyngeal swab specimens and should not be used as a sole basis for treatment. Nasal washings and aspirates are unacceptable for Xpert Xpress SARS-CoV-2/FLU/RSV testing.  Fact Sheet for Patients: EntrepreneurPulse.com.au  Fact Sheet for Healthcare Providers: IncredibleEmployment.be  This test is not yet approved or cleared by the [place] FDA and has been authorized for detection and/or diagnosis of SARS-CoV-2  by FDA under an Emergency Use Authorization (EUA). This EUA will remain in effect (meaning this test can be used) for the duration of the COVID-19 declaration under Section 564(b)(1) of the Act, 21 U.S.C. section 360bbb-3(b)(1), unless the authorization is terminated or revoked.  Performed at [facility], [place] [address] [place], [place] [address]   MRSA Next Gen by PCR, Nasal     Status: Abnormal   Collection Time: 01/27/22  1:20 AM  Result Value Ref Range Status   MRSA by PCR Next Gen DETECTED (A) NOT DETECTED Final    Comment: (NOTE) The GeneXpert MRSA Assay (FDA approved for NASAL specimens only), is one component of a comprehensive MRSA colonization surveillance program. It is not intended to diagnose MRSA infection nor to guide or monitor treatment for MRSA infections. Test performance is not FDA approved in patients less than 47 years old. Performed at [facility], [place] [address] [place], [place] [address]      Labs: BNP (last 3 results) Recent Labs    07/04/21 0117 07/05/21 0124 07/06/21 0118  BNP 134.1* 138.0* 761.9*   Basic Metabolic Panel: Recent Labs  Lab 01/26/22 1718 01/27/22 0532 01/27/22 0533 01/28/22 0537 01/30/22 0256  NA 135 133*  --  133* 133*  K 4.4 3.9  --  3.9 4.6  CL 100 102  --  101 100  CO2 28 27  --  26 26  GLUCOSE 193* 214*  --  273* 332*  BUN 10 9  --  10 12  CREATININE 0.66 0.66  --  0.54* 0.54*  CALCIUM 8.9 8.5*  --  8.7* 8.9  MG  --  1.6* 1.7 1.7  --   PHOS  --   --   --  3.0  --    Liver Function Tests: Recent Labs  Lab 01/26/22 1718 01/27/22 0532 01/28/22 0537  AST _0 ALT _1 ALKPHOS 99 82 87  BILITOT 0.5 0.4 0.4  PROT 7.9 6.7 6.8  ALBUMIN 2.8* 2.4* 2.4*   No results for input(s): "LIPASE", "AMYLASE" in the last 168 hours. No results for input(s): "AMMONIA" in the last 168 hours. CBC: Recent Labs  Lab 01/26/22 1718 01/27/22 0532 01/28/22 0537  WBC 13.1*  10.9* 9.2  NEUTROABS 11.1* 8.2* 6.2  HGB 10.5* 9.4* 10.0*  HCT 32.6* 29.1* 31.2*  MCV 86.5 86.1 85.7  PLT 314 236 277   Cardiac Enzymes: No results for input(s): "CKTOTAL", "CKMB", "CKMBINDEX", "TROPONINI" in the last 168 hours. BNP: Invalid input(s): "POCBNP" CBG: Recent Labs  Lab 01/29/22 1134 01/29/22 1656 01/29/22 2107 01/30/22 0752 01/30/22 1149  GLUCAP 289* 393* 297* 218* 344*   D-Dimer No results for input(s): "DDIMER" in the last 72 hours. Hgb A1c No results for input(s): "HGBA1C" in the last 72 hours. Lipid Profile No results for input(s): "CHOL", "HDL", "LDLCALC", "TRIG", "CHOLHDL", "LDLDIRECT" in the last 72 hours. Thyroid function studies No results for input(s): "TSH", "T4TOTAL", "T3FREE", "THYROIDAB" in the last 72 hours.  Invalid input(s): "FREET3" Anemia work up No results for input(s): "VITAMINB12", "FOLATE", "FERRITIN", "TIBC", "IRON", "RETICCTPCT" in the last 72 hours. Urinalysis    Component Value Date/Time   COLORURINE YELLOW 01/26/2022 2031   APPEARANCEUR CLOUDY (A) 01/26/2022 2031   APPEARANCEUR Hazy (A) 05/09/2021 1006   LABSPEC 1.015 01/26/2022 2031   PHURINE 8.0 01/26/2022 2031   GLUCOSEU >=500 (A) 01/26/2022 2031   HGBUR SMALL (A) 01/26/2022 2031   BILIRUBINUR NEGATIVE 01/26/2022 2031   BILIRUBINUR Negative 05/09/2021 [place] 01/26/2022 2031   PROTEINUR 30 (A) 01/26/2022 2031   NITRITE POSITIVE (A) 01/26/2022 2031   LEUKOCYTESUR LARGE (A) 01/26/2022 2031   Sepsis Labs Recent Labs  Lab 01/26/22 1718 01/27/22 0532 01/28/22 0537  WBC 13.1* 10.9* 9.2   Microbiology Recent Results (from the past 240 hour(s))  Blood Culture (routine x 2)     Status: Abnormal   Collection Time: 01/26/22  5:18 PM   Specimen: BLOOD  Result Value Ref Range Status   Specimen Description   Final    BLOOD RIGHT ANTECUBITAL Performed at [facility], [place] [address], [place], [place] [address]    Special Requests    Final    BOTTLES DRAWN AEROBIC AND ANAEROBIC Blood Culture adequate volume Performed at [facility] [address], [place], [place] [address]    Culture  Setup Time   Final    GRAM NEGATIVE RODS ANAEROBIC BOTTLE ONLY CRITICAL RESULT CALLED TO, READ BACK BY AND VERIFIED WITH: PHARMD C.SHEAD AT 1434 ON 01/27/2022 BY T.SAAD. Performed at [facility], [place] [address], [place], [place] [address]    Culture PROVIDENCIA RETTGERI (A)  Final   Report Status 01/29/2022 FINAL  Final   Organism ID, Bacteria PROVIDENCIA RETTGERI  Final      Susceptibility   Providencia rettgeri - MIC*    AMPICILLIN >=32 RESISTANT Resistant     CEFAZOLIN >=64 RESISTANT Resistant     CEFEPIME 4 INTERMEDIATE Intermediate     CEFTAZIDIME 4 SENSITIVE Sensitive     CIPROFLOXACIN >=4 RESISTANT Resistant     GENTAMICIN <=1 SENSITIVE Sensitive     IMIPENEM 1 SENSITIVE Sensitive     TRIMETH/SULFA >=320 RESISTANT Resistant     AMPICILLIN/SULBACTAM >=32 RESISTANT Resistant     PIP/TAZO <=4 SENSITIVE Sensitive     * PROVIDENCIA RETTGERI  Blood Culture ID Panel (Reflexed)     Status: Abnormal   Collection Time: 01/26/22  5:18 PM  Result Value Ref Range Status   Enterococcus faecalis NOT DETECTED NOT DETECTED Final   Enterococcus Faecium NOT DETECTED NOT DETECTED Final   Listeria monocytogenes NOT DETECTED NOT DETECTED Final   Staphylococcus species NOT DETECTED NOT DETECTED Final   Staphylococcus aureus (BCID) NOT DETECTED NOT DETECTED Final   Staphylococcus epidermidis NOT DETECTED NOT DETECTED Final   Staphylococcus lugdunensis NOT DETECTED NOT DETECTED Final   Streptococcus species NOT DETECTED NOT DETECTED Final   Streptococcus agalactiae NOT DETECTED NOT DETECTED Final   Streptococcus pneumoniae NOT DETECTED NOT DETECTED Final   Streptococcus pyogenes NOT DETECTED NOT DETECTED Final   A.calcoaceticus-baumannii NOT DETECTED NOT DETECTED Final   Bacteroides fragilis NOT DETECTED NOT  DETECTED Final   Enterobacterales DETECTED (A) NOT DETECTED Final    Comment: Enterobacterales represent a large order of gram negative bacteria, not a single organism. Refer to culture for further identification. CRITICAL RESULT CALLED TO, READ BACK BY AND VERIFIED WITH: PHARMD C.SHEAD AT 1434 ON 01/27/2022 BY T.SAAD.    Enterobacter cloacae complex NOT DETECTED NOT DETECTED Final   Escherichia coli NOT DETECTED NOT DETECTED Final   Klebsiella aerogenes NOT DETECTED NOT DETECTED Final   Klebsiella oxytoca NOT DETECTED NOT DETECTED Final   Klebsiella pneumoniae NOT DETECTED NOT DETECTED Final   Proteus species NOT DETECTED NOT DETECTED Final   Salmonella species NOT DETECTED NOT DETECTED Final   Serratia marcescens NOT DETECTED NOT DETECTED Final   Haemophilus influenzae NOT DETECTED NOT DETECTED Final   Neisseria meningitidis NOT DETECTED NOT DETECTED Final   Pseudomonas aeruginosa NOT DETECTED NOT DETECTED Final   Stenotrophomonas maltophilia NOT DETECTED NOT DETECTED Final   Candida albicans NOT DETECTED NOT DETECTED Final   Candida auris NOT DETECTED NOT DETECTED Final   Candida glabrata NOT DETECTED NOT DETECTED Final   Candida krusei NOT DETECTED NOT DETECTED Final   Candida parapsilosis NOT DETECTED NOT DETECTED Final   Candida tropicalis NOT DETECTED NOT DETECTED Final   Cryptococcus neoformans/gattii NOT DETECTED NOT DETECTED Final   CTX-M ESBL DETECTED (A) NOT DETECTED Final    Comment: CRITICAL RESULT CALLED TO, READ BACK BY AND VERIFIED WITH: PHARMD C.SHEAD AT 1434 ON 01/27/2022 BY T.SAAD. (NOTE) Extended spectrum beta-lactamase detected. Recommend a carbapenem as initial therapy.      Carbapenem resistance IMP NOT DETECTED NOT DETECTED Final   Carbapenem resistance KPC NOT DETECTED NOT DETECTED Final   Carbapenem resistance NDM NOT DETECTED NOT DETECTED Final   Carbapenem resist OXA 48 LIKE NOT DETECTED NOT DETECTED Final   Carbapenem resistance VIM NOT DETECTED NOT  DETECTED Final    Comment: Performed at [facility], [place] [address], [place], [place] [address]  Blood Culture (routine x 2)     Status:  None (Preliminary result)   Collection Time: 01/26/22  5:58 PM   Specimen: BLOOD  Result Value Ref Range Status   Specimen Description   Final    BLOOD SITE NOT SPECIFIED Performed at [facility] [address], [place], [place] [address]    Special Requests   Final    BOTTLES DRAWN AEROBIC AND ANAEROBIC Blood Culture adequate volume Performed at [facility] [address] [place], [place] [address]    Culture   Final    NO GROWTH 4 DAYS Performed at [facility], [place] [address] [place], [place] [address]    Report Status PENDING  Incomplete  Urine Culture     Status: Abnormal   Collection Time: 01/26/22  8:31 PM   Specimen: In/Out Cath Urine  Result Value Ref Range Status   Specimen Description   Final    IN/OUT CATH URINE Performed at [facility] [address], [place], [place] [address]    Special Requests   Final    NONE Performed at [facility], [place] [address], [place], [place] [address]    Culture (A)  Final    >=100,000 COLONIES/mL PROVIDENCIA RETTGERI >=100,000 COLONIES/mL ENTEROCOCCUS FAECALIS    Report Status 01/30/2022 FINAL  Final   Organism ID, Bacteria ENTEROCOCCUS FAECALIS (A)  Final   Organism ID, Bacteria PROVIDENCIA RETTGERI (A)  Final      Susceptibility   Enterococcus faecalis - MIC*    AMPICILLIN <=2 SENSITIVE Sensitive     NITROFURANTOIN <=16 SENSITIVE Sensitive     VANCOMYCIN <=0.5 SENSITIVE Sensitive     * >=100,000 COLONIES/mL ENTEROCOCCUS FAECALIS   Providencia rettgeri - MIC*    AMPICILLIN >=32 RESISTANT Resistant     CEFAZOLIN >=64 RESISTANT Resistant     CEFEPIME 2 SENSITIVE Sensitive     CIPROFLOXACIN >=4 RESISTANT Resistant     GENTAMICIN <=1 SENSITIVE Sensitive     IMIPENEM 1 SENSITIVE Sensitive      NITROFURANTOIN 128 RESISTANT Resistant     TRIMETH/SULFA >=320 RESISTANT Resistant     AMPICILLIN/SULBACTAM >=32 RESISTANT Resistant     PIP/TAZO <=4 SENSITIVE Sensitive     * >=100,000 COLONIES/mL PROVIDENCIA RETTGERI  Resp Panel by RT-PCR (Flu A&B, Covid) Anterior Nasal Swab     Status: None   Collection Time: 01/26/22  8:31 PM   Specimen: Anterior Nasal Swab  Result Value Ref Range Status   SARS Coronavirus 2 by RT PCR NEGATIVE NEGATIVE Final    Comment: (NOTE) SARS-CoV-2 target nucleic acids are NOT DETECTED.  The SARS-CoV-2 RNA is generally detectable in upper respiratory specimens during the acute phase of infection. The lowest concentration of SARS-CoV-2 viral copies this assay can detect is 138 copies/mL. A negative result does not preclude SARS-Cov-2 infection and should not be used as the sole basis for treatment or other patient management decisions. A negative result may occur with  improper specimen collection/handling, submission of specimen other than nasopharyngeal swab, presence of viral mutation(s) within the areas targeted by this assay, and inadequate number of viral copies(<138 copies/mL). A negative result must be combined with clinical observations, patient history, and epidemiological information. The expected result is Negative.  Fact Sheet for Patients:  EntrepreneurPulse.com.au  Fact Sheet for Healthcare Providers:  IncredibleEmployment.be  This test is no t yet approved or cleared by the [place] FDA and  has been authorized for detection and/or diagnosis of SARS-CoV-2 by FDA under an Emergency Use Authorization (EUA).  This EUA will remain  in effect (meaning this test can be used) for the duration of the COVID-19 declaration under Section 564(b)(1) of the Act, 21 U.S.C.section 360bbb-3(b)(1), unless the authorization is terminated  or revoked sooner.       Influenza A by PCR NEGATIVE NEGATIVE Final    Influenza B by PCR NEGATIVE NEGATIVE Final    Comment: (NOTE) The Xpert Xpress SARS-CoV-2/FLU/RSV plus assay is intended as an aid in the diagnosis of influenza from Nasopharyngeal swab specimens and should not be used as a sole basis for treatment. Nasal washings and aspirates are unacceptable for Xpert Xpress SARS-CoV-2/FLU/RSV testing.  Fact Sheet for Patients: EntrepreneurPulse.com.au  Fact Sheet for Healthcare Providers: IncredibleEmployment.be  This test is not yet approved or cleared by the [place] FDA and has been authorized for detection and/or diagnosis of SARS-CoV-2 by FDA under an Emergency Use Authorization (EUA). This EUA will remain in effect (meaning this test can be used) for the duration of the COVID-19 declaration under Section 564(b)(1) of the Act, 21 U.S.C. section 360bbb-3(b)(1), unless the authorization is terminated or revoked.  Performed at [facility], [place] [address] [place], [place] [address]   MRSA Next Gen by PCR, Nasal     Status: Abnormal   Collection Time: 01/27/22  1:20 AM  Result Value Ref Range Status   MRSA by PCR Next Gen DETECTED (A) NOT DETECTED Final    Comment: (NOTE) The GeneXpert MRSA Assay (FDA approved for NASAL specimens only), is one component of a comprehensive MRSA colonization surveillance program. It is not intended to diagnose MRSA infection nor to guide or monitor treatment for MRSA infections. Test performance is not FDA approved in patients less than 39 years old. Performed at [facility], [place] [address] [place],  [address]     Please note: You were cared for by a hospitalist during your hospital stay. Once you are discharged, your primary care physician will handle any further medical issues. Please note that NO REFILLS for any discharge medications will be authorized once you are discharged, as it is imperative that you return to  your primary care physician (or establish a relationship with a primary care physician if you do not have one) for your post hospital discharge needs so that they can reassess your need for medications and monitor your lab values.    Time coordinating discharge: 40 minutes  SIGNED:   Shelly Coss, MD  [facility] 01/30/2022, 1:11 PM Pager [phone]  If 7PM-7AM, please contact night-coverage [email or web address] Password [email or web address]

## 2022-01-31 LAB — CULTURE, BLOOD (ROUTINE X 2)
Culture: NO GROWTH
Special Requests: ADEQUATE

## 2022-02-06 ENCOUNTER — Ambulatory Visit (INDEPENDENT_AMBULATORY_CARE_PROVIDER_SITE_OTHER): Payer: Commercial Managed Care - HMO | Admitting: Internal Medicine

## 2022-02-06 ENCOUNTER — Encounter: Payer: Self-pay | Admitting: Internal Medicine

## 2022-02-06 ENCOUNTER — Other Ambulatory Visit: Payer: Self-pay

## 2022-02-06 VITALS — BP 111/71 | HR 84 | Temp 97.6°F | Resp 16

## 2022-02-06 DIAGNOSIS — N390 Urinary tract infection, site not specified: Secondary | ICD-10-CM

## 2022-02-06 NOTE — Progress Notes (Addendum)
Patient Active Problem List   Diagnosis Date Noted   UTI (urinary tract infection) 01/26/2022   Chronic diastolic heart failure ([place]) 01/26/2022   History of orthostatic hypotension 01/26/2022   Dementia ([place]) 11/30/2021   Pressure injury of sacral region, stage 2 ([place]) 11/30/2021   Multiple fractures of ribs, left side, initial encounter for closed fracture 11/29/2021   Failure to thrive in adult 11/29/2021   Anemia of chronic disease 09/10/2021   Catheter-associated urinary tract infection ([place]) 08/27/2021   Goals of care, counseling/discussion 08/26/2021   Delirium 08/23/2021   Confusion 08/23/2021   Unintentional weight loss of more than 10% body weight within 6 months 08/21/2021   Hemorrhoids 08/21/2021   Diverticulosis of colon 08/21/2021   Solitary rectal ulcer 08/21/2021   Polyp of ascending colon 08/21/2021   Hypokalemia 08/21/2021   Acute blood loss anemia 08/20/2021   Chronic orthostatic hypotension 08/20/2021   Protein-calorie malnutrition, severe ([place]) 08/20/2021   Hypomagnesemia 08/20/2021   Physical deconditioning 08/20/2021   Acute lower GI bleeding 08/19/2021   Pressure injury of skin 07/02/2021   Severe sepsis ([place]) 06/30/2021   Hypothermia 06/30/2021   Urinary retention 04/23/2021   Acute prerenal azotemia 04/22/2021   Leukocytosis 04/22/2021   HLD (hyperlipidemia) 78/67/6720   Acute metabolic encephalopathy 94/70/9628   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/23/2021   DM2 (diabetes mellitus, type 2) ([place]) 03/23/2021   BPH (benign prostatic hyperplasia) 02/08/2021   Phimosis 01/27/2021   Hypertension 12/23/2020   CAD S/p remote CABG 12/23/2020    Patient's Medications  New Prescriptions   No medications on file  Previous Medications   ASPIRIN EC 81 MG TABLET    Take 1 tablet (81 mg total) by mouth daily. Swallow whole.   BLOOD GLUCOSE METER KIT AND SUPPLIES    Dispense based on patient and insurance preference. Use up to four times daily as directed. (FOR  ICD-10 E10.9, E11.9).   DOCUSATE SODIUM (COLACE) 100 MG CAPSULE    Take 100 mg by mouth at bedtime.   DRONABINOL (MARINOL) 5 MG CAPSULE    Take 5 mg by mouth 2 (two) times daily before a meal.   EZETIMIBE (ZETIA) 10 MG TABLET    Take 1 tablet (10 mg total) by mouth daily.   GLUCERNA (GLUCERNA) LIQD    Take 237 mLs by mouth 3 (three) times daily with meals.   HYDROCORTISONE (ANUSOL-HC) 2.5 % RECTAL CREAM    Place rectally 2 (two) times daily.   INSULIN GLARGINE-YFGN (SEMGLEE) 100 UNIT/ML INJECTION    Inject 0.18 mLs (18 Units total) into the skin daily.   INSULIN LISPRO (HUMALOG) 100 UNIT/ML KWIKPEN    Inject 2-10 Units into the skin See admin instructions. Inject 2-10 units into the skin daily with meals, PER SLIDING SCALE: BGL 140-199 = 2 units; 200-250 = 4 units; 251-299 = 6 units; 300-349 = 8 units; 350+ = 10 units; <70 or >350, CALL MD   LIDO-PE-GLYCERIN-PETROLATUM (PREPARATION H RAPID RELIEF) 5-0.25-14.4-15 % CREA    Place 1 Application rectally every 6 (six) hours as needed (hemorrhoids).   LOPERAMIDE (IMODIUM) 2 MG CAPSULE    Take 1 capsule (2 mg total) by mouth as needed for diarrhea or loose stools.   MAGNESIUM OXIDE (MAG-OX) 400 (240 MG) MG TABLET    Take 400 mg by mouth daily.   METHENAMINE (HIPREX) 1 G TABLET    Take 1 g by mouth 2 (two) times daily with a meal.  MIDODRINE (PROAMATINE) 10 MG TABLET    Take 1 tablet (10 mg total) by mouth 3 (three) times daily with meals.   MULTIPLE VITAMIN (MULTIVITAMIN WITH MINERALS) TABS TABLET    Take 1 tablet by mouth daily.   NON FORMULARY    Take 118 mLs by mouth See admin instructions. Magic Cup desert cup- Eat 1 "cup" (118 ml's) by mouth 2 times a day- with lunch and supper   PANTOPRAZOLE (PROTONIX) 40 MG TABLET    Take 1 tablet (40 mg total) by mouth daily. Any generic PPI is okay to dispense   POLYETHYLENE GLYCOL (MIRALAX / GLYCOLAX) 17 G PACKET    Take 17 g by mouth daily.   ROSUVASTATIN (CRESTOR) 20 MG TABLET    Take 20 mg by mouth daily.    THIAMINE 100 MG TABLET    Take 1 tablet (100 mg total) by mouth daily.  Modified Medications   No medications on file  Discontinued Medications   No medications on file    Subjective: 61 year old male with chronic indwelling catheter tenderness to repair UTI, heart failure, dementia, diabetes, anemia of chronic disease presents for hospital follow-up of Providencia rettgeri bacteremia secondary to UTI     He was admitted to [facility] 11/16 - 11/20 for sepsis secondary to UTI.  On arrival he had a temp of 101, WBC 13 K.  Initially started on pip-tazo transition meropenem as urine cultures grew Providencia rettgeri ESBL.  Infectious diseases engaged patient transition to ertapenem to complete 10 days of antibiotics for complicated UTI EOT 82/95.  Opted for a longer course of treatment due to chronic indwelling Foley and history of recurrent UTI.  Recommended urology follow-up on discharge. Today: Patient denies fever, chills.  He has a catheter in place and states things has urology follow-up.  PICC line in place no issues. Review of Systems: ROS  Past Medical History:  Diagnosis Date   Arthritis    Chronic diastolic heart failure (HCC)    Dementia (HCC)    Diabetes mellitus without complication (HCC)    Heart disease    History of recurrent UTIs    (has chronic indwelling foley catheter)   Hypertension     Social History   Tobacco Use   Smoking status: Never   Smokeless tobacco: Never  Vaping Use   Vaping Use: Never used  Substance Use Topics   Alcohol use: Not Currently    Comment: none in 6 months may open a bottle of wine tonight 11/1112022   Drug use: Never    Family History  Problem Relation Age of Onset   Diabetes Mother    Hypertension Mother    Diabetes Father    Hypertension Father    Heart attack Father    Diabetes Sister    Diabetes Sister     No Known Allergies  Health Maintenance  Topic Date Due   COVID-19 Vaccine (1) Never done   FOOT EXAM   Never done   OPHTHALMOLOGY EXAM  Never done   Diabetic kidney evaluation - Urine ACR  Never done   INFLUENZA VACCINE  10/11/2021   HEMOGLOBIN A1C  07/28/2022   Diabetic kidney evaluation - GFR measurement  01/31/2023   COLONOSCOPY (Pts 45-58yr Insurance coverage will need to be confirmed)  08/22/2031   Hepatitis C Screening  Completed   HIV Screening  Completed   Zoster Vaccines- Shingrix  Completed   HPV VACCINES  Aged Out    Objective:  There were no vitals  filed for this visit. There is no height or weight on file to calculate BMI.  Physical Exam  Lab Results Lab Results  Component Value Date   WBC 9.2 01/28/2022   HGB 10.0 (L) 01/28/2022   HCT 31.2 (L) 01/28/2022   MCV 85.7 01/28/2022   PLT 277 01/28/2022    Lab Results  Component Value Date   CREATININE 0.54 (L) 01/30/2022   BUN 12 01/30/2022   NA 133 (L) 01/30/2022   K 4.6 01/30/2022   CL 100 01/30/2022   CO2 26 01/30/2022    Lab Results  Component Value Date   ALT 26 01/28/2022   AST 29 01/28/2022   ALKPHOS 87 01/28/2022   BILITOT 0.4 01/28/2022    Lab Results  Component Value Date   CHOL 138 07/03/2021   HDL 34 (L) 07/03/2021   LDLCALC 85 07/03/2021   TRIG 95 07/03/2021   CHOLHDL 4.1 07/03/2021   Lab Results  Component Value Date   LABRPR NON REACTIVE 07/03/2021   No results found for: "HIV1RNAQUANT", "HIV1RNAVL", "CD4TABS"   Problem List Items Addressed This Visit   None  61 year old male with chronic indwelling Foley presents for hospital follow-up of: #Sepsis secondary to bacteremia/UTI #Providencia rettgeri bacteremia secondary to UTI #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  Regardless he was treated with 3 days of ceftriaxone which would have been sufficient. - Discharged on ertapenem to complete 10 weeks of antibiotics for complicated UTI.  Plan: - DC ertapenem - Pull PICC line, F/U with ID in one  week to ensure PICC line is pulled - Labs today - Follow-up with urology   Laurice Record, [facility] 02/06/2022, 4:15 PM

## 2022-02-07 LAB — CBC WITH DIFFERENTIAL/PLATELET
Absolute Monocytes: 706 cells/uL (ref 200–950)
Basophils Absolute: 38 cells/uL (ref 0–200)
Basophils Relative: 0.3 %
Eosinophils Absolute: 227 cells/uL (ref 15–500)
Eosinophils Relative: 1.8 %
HCT: 35.1 % — ABNORMAL LOW (ref 38.5–50.0)
Hemoglobin: 11.1 g/dL — ABNORMAL LOW (ref 13.2–17.1)
Lymphs Abs: 1247 cells/uL (ref 850–3900)
MCH: 27.7 pg (ref 27.0–33.0)
MCHC: 31.6 g/dL — ABNORMAL LOW (ref 32.0–36.0)
MCV: 87.5 fL (ref 80.0–100.0)
MPV: 9.5 fL (ref 7.5–12.5)
Monocytes Relative: 5.6 %
Neutro Abs: 10382 cells/uL — ABNORMAL HIGH (ref 1500–7800)
Neutrophils Relative %: 82.4 %
Platelets: 414 10*3/uL — ABNORMAL HIGH (ref 140–400)
RBC: 4.01 10*6/uL — ABNORMAL LOW (ref 4.20–5.80)
RDW: 14.3 % (ref 11.0–15.0)
Total Lymphocyte: 9.9 %
WBC: 12.6 10*3/uL — ABNORMAL HIGH (ref 3.8–10.8)

## 2022-02-07 LAB — COMPLETE METABOLIC PANEL WITH GFR
AG Ratio: 0.8 (calc) — ABNORMAL LOW (ref 1.0–2.5)
ALT: 24 U/L (ref 9–46)
AST: 25 U/L (ref 10–35)
Albumin: 3.2 g/dL — ABNORMAL LOW (ref 3.6–5.1)
Alkaline phosphatase (APISO): 113 U/L (ref 35–144)
BUN: 11 mg/dL (ref 7–25)
CO2: 28 mmol/L (ref 20–32)
Calcium: 9 mg/dL (ref 8.6–10.3)
Chloride: 101 mmol/L (ref 98–110)
Creat: 0.72 mg/dL (ref 0.70–1.30)
Globulin: 4.1 g/dL (calc) — ABNORMAL HIGH (ref 1.9–3.7)
Glucose, Bld: 344 mg/dL — ABNORMAL HIGH (ref 65–99)
Potassium: 4.8 mmol/L (ref 3.5–5.3)
Sodium: 136 mmol/L (ref 135–146)
Total Bilirubin: 0.2 mg/dL (ref 0.2–1.2)
Total Protein: 7.3 g/dL (ref 6.1–8.1)
eGFR: 105 mL/min/{1.73_m2} (ref >=60)

## 2022-02-07 LAB — C-REACTIVE PROTEIN: CRP: 23.4 mg/L — ABNORMAL HIGH (ref <8.0)

## 2022-02-07 LAB — SEDIMENTATION RATE: Sed Rate: 74 mm/h — ABNORMAL HIGH (ref 0–20)

## 2022-02-08 ENCOUNTER — Telehealth: Payer: Self-pay

## 2022-02-08 NOTE — Telephone Encounter (Signed)
Charles [name] with [facility] called ([phone]) requesting labs that were drawn here. She would like them to be faxed to [phone], which I will fax. I also asked Charles [name] to fax labs that have been drawn by their facility. Charles [name] also confirmed that patient's picc line has been removed and I canceled patient's appointment for next week which was to confirm picc removal.  Charles [name] Pricilla Loveless

## 2022-02-13 ENCOUNTER — Ambulatory Visit: Payer: Commercial Managed Care - HMO | Admitting: Internal Medicine

## 2022-02-20 ENCOUNTER — Ambulatory Visit (INDEPENDENT_AMBULATORY_CARE_PROVIDER_SITE_OTHER): Payer: Commercial Managed Care - HMO | Admitting: Internal Medicine

## 2022-02-20 ENCOUNTER — Telehealth: Payer: Self-pay

## 2022-02-20 ENCOUNTER — Encounter: Payer: Self-pay | Admitting: Internal Medicine

## 2022-02-20 ENCOUNTER — Other Ambulatory Visit: Payer: Self-pay

## 2022-02-20 VITALS — BP 99/66 | HR 93 | Temp 97.9°F | Ht 66.0 in

## 2022-02-20 DIAGNOSIS — R7881 Bacteremia: Secondary | ICD-10-CM

## 2022-02-20 NOTE — Progress Notes (Signed)
Patient Active Problem List   Diagnosis Date Noted   UTI (urinary tract infection) 01/26/2022   Chronic diastolic heart failure ([place]) 01/26/2022   History of orthostatic hypotension 01/26/2022   Dementia ([place]) 11/30/2021   Pressure injury of sacral region, stage 2 ([place]) 11/30/2021   Multiple fractures of ribs, left side, initial encounter for closed fracture 11/29/2021   Failure to thrive in adult 11/29/2021   Anemia of chronic disease 09/10/2021   Catheter-associated urinary tract infection ([place]) 08/27/2021   Goals of care, counseling/discussion 08/26/2021   Delirium 08/23/2021   Confusion 08/23/2021   Unintentional weight loss of more than 10% body weight within 6 months 08/21/2021   Hemorrhoids 08/21/2021   Diverticulosis of colon 08/21/2021   Solitary rectal ulcer 08/21/2021   Polyp of ascending colon 08/21/2021   Hypokalemia 08/21/2021   Acute blood loss anemia 08/20/2021   Chronic orthostatic hypotension 08/20/2021   Protein-calorie malnutrition, severe ([place]) 08/20/2021   Hypomagnesemia 08/20/2021   Physical deconditioning 08/20/2021   Acute lower GI bleeding 08/19/2021   Pressure injury of skin 07/02/2021   Severe sepsis ([place]) 06/30/2021   Hypothermia 06/30/2021   Urinary retention 04/23/2021   Acute prerenal azotemia 04/22/2021   Leukocytosis 04/22/2021   HLD (hyperlipidemia) 78/67/6720   Acute metabolic encephalopathy 94/70/9628   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/23/2021   DM2 (diabetes mellitus, type 2) ([place]) 03/23/2021   BPH (benign prostatic hyperplasia) 02/08/2021   Phimosis 01/27/2021   Hypertension 12/23/2020   CAD S/p remote CABG 12/23/2020    Patient's Medications  New Prescriptions   No medications on file  Previous Medications   ASPIRIN EC 81 MG TABLET    Take 1 tablet (81 mg total) by mouth daily. Swallow whole.   BLOOD GLUCOSE METER KIT AND SUPPLIES    Dispense based on patient and insurance preference. Use up to four times daily as directed. (FOR  ICD-10 E10.9, E11.9).   DOCUSATE SODIUM (COLACE) 100 MG CAPSULE    Take 100 mg by mouth at bedtime.   DRONABINOL (MARINOL) 5 MG CAPSULE    Take 5 mg by mouth 2 (two) times daily before a meal.   EZETIMIBE (ZETIA) 10 MG TABLET    Take 1 tablet (10 mg total) by mouth daily.   GLUCERNA (GLUCERNA) LIQD    Take 237 mLs by mouth 3 (three) times daily with meals.   HYDROCORTISONE (ANUSOL-HC) 2.5 % RECTAL CREAM    Place rectally 2 (two) times daily.   INSULIN GLARGINE-YFGN (SEMGLEE) 100 UNIT/ML INJECTION    Inject 0.18 mLs (18 Units total) into the skin daily.   INSULIN LISPRO (HUMALOG) 100 UNIT/ML KWIKPEN    Inject 2-10 Units into the skin See admin instructions. Inject 2-10 units into the skin daily with meals, PER SLIDING SCALE: BGL 140-199 = 2 units; 200-250 = 4 units; 251-299 = 6 units; 300-349 = 8 units; 350+ = 10 units; <70 or >350, CALL MD   LIDO-PE-GLYCERIN-PETROLATUM (PREPARATION H RAPID RELIEF) 5-0.25-14.4-15 % CREA    Place 1 Application rectally every 6 (six) hours as needed (hemorrhoids).   LOPERAMIDE (IMODIUM) 2 MG CAPSULE    Take 1 capsule (2 mg total) by mouth as needed for diarrhea or loose stools.   MAGNESIUM OXIDE (MAG-OX) 400 (240 MG) MG TABLET    Take 400 mg by mouth daily.   METHENAMINE (HIPREX) 1 G TABLET    Take 1 g by mouth 2 (two) times daily with a meal.  MIDODRINE (PROAMATINE) 10 MG TABLET    Take 1 tablet (10 mg total) by mouth 3 (three) times daily with meals.   MULTIPLE VITAMIN (MULTIVITAMIN WITH MINERALS) TABS TABLET    Take 1 tablet by mouth daily.   NON FORMULARY    Take 118 mLs by mouth See admin instructions. Magic Cup desert cup- Eat 1 "cup" (118 ml's) by mouth 2 times a day- with lunch and supper   PANTOPRAZOLE (PROTONIX) 40 MG TABLET    Take 1 tablet (40 mg total) by mouth daily. Any generic PPI is okay to dispense   POLYETHYLENE GLYCOL (MIRALAX / GLYCOLAX) 17 G PACKET    Take 17 g by mouth daily.   ROSUVASTATIN (CRESTOR) 20 MG TABLET    Take 20 mg by mouth daily.    THIAMINE 100 MG TABLET    Take 1 tablet (100 mg total) by mouth daily.  Modified Medications   No medications on file  Discontinued Medications   No medications on file    Subjective: 61 year old male with chronic indwelling catheter tenderness to repair UTI, heart failure, dementia, diabetes, anemia of chronic disease presents for hospital follow-up of Providencia rettgeri bacteremia secondary to UTI     He was admitted to [facility] 11/16 - 11/20 for sepsis secondary to UTI.  On arrival he had a temp of 101, WBC 13 K.  Initially started on pip-tazo transition meropenem as urine cultures grew Providencia rettgeri ESBL.  Infectious diseases engaged patient transition to ertapenem to complete 10 days of antibiotics for complicated UTI EOT 67/12.  Opted for a longer course of treatment due to chronic indwelling Foley and history of recurrent UTI.  Recommended urology follow-up on discharge. 11/27: Patient denies fever, chills.  He has a catheter in place and states things has urology follow-up.  PICC line in place no issues.  Today 12/11: Pt  reports he feels well. No new complaint. PICC line out. Reports he was seen by Urology in the interim. Review of Systems: Review of Systems  All other systems reviewed and are negative.   Past Medical History:  Diagnosis Date   Arthritis    Chronic diastolic heart failure (HCC)    Dementia (HCC)    Diabetes mellitus without complication ([place])    Heart disease    History of recurrent UTIs    (has chronic indwelling foley catheter)   Hypertension     Social History   Tobacco Use   Smoking status: Never   Smokeless tobacco: Never  Vaping Use   Vaping Use: Never used  Substance Use Topics   Alcohol use: Not Currently    Comment: none in 6 months may open a bottle of wine tonight 11/1112022   Drug use: Never    Family History  Problem Relation Age of Onset   Diabetes Mother    Hypertension Mother    Diabetes Father    Hypertension  Father    Heart attack Father    Diabetes Sister    Diabetes Sister     No Known Allergies  Health Maintenance  Topic Date Due   COVID-19 Vaccine (1) Never done   FOOT EXAM  Never done   OPHTHALMOLOGY EXAM  Never done   Diabetic kidney evaluation - Urine ACR  Never done   DTaP/Tdap/Td (1 - Tdap) Never done   INFLUENZA VACCINE  10/11/2021   HEMOGLOBIN A1C  07/28/2022   Diabetic kidney evaluation - eGFR measurement  02/07/2023   COLONOSCOPY (Pts 45-28yr Insurance coverage  will need to be confirmed)  08/22/2031   Hepatitis C Screening  Completed   HIV Screening  Completed   Zoster Vaccines- Shingrix  Completed   HPV VACCINES  Aged Out    Objective:  There were no vitals filed for this visit. There is no height or weight on file to calculate BMI.  Physical Exam Constitutional:      General: He is not in acute distress.    Appearance: He is normal weight. He is not toxic-appearing.  HENT:     Head: Normocephalic and atraumatic.     Right Ear: External ear normal.     Left Ear: External ear normal.     Nose: No congestion or rhinorrhea.     Mouth/Throat:     Mouth: Mucous membranes are moist.     Pharynx: Oropharynx is clear.  Eyes:     Extraocular Movements: Extraocular movements intact.     Conjunctiva/sclera: Conjunctivae normal.     Pupils: Pupils are equal, round, and reactive to light.  Cardiovascular:     Rate and Rhythm: Normal rate and regular rhythm.     Heart sounds: No murmur heard.    No friction rub. No gallop.  Pulmonary:     Effort: Pulmonary effort is normal.     Breath sounds: Normal breath sounds.  Abdominal:     General: Abdomen is flat. Bowel sounds are normal.     Palpations: Abdomen is soft.  Musculoskeletal:        General: No swelling. Normal range of motion.     Cervical back: Normal range of motion and neck supple.  Skin:    General: Skin is warm and dry.  Neurological:     General: No focal deficit present.     Mental Status: He is  oriented to person, place, and time.  Psychiatric:        Mood and Affect: Mood normal.     Lab Results Lab Results  Component Value Date   WBC 12.6 (H) 02/06/2022   HGB 11.1 (L) 02/06/2022   HCT 35.1 (L) 02/06/2022   MCV 87.5 02/06/2022   PLT 414 (H) 02/06/2022    Lab Results  Component Value Date   CREATININE 0.72 02/06/2022   BUN 11 02/06/2022   NA 136 02/06/2022   K 4.8 02/06/2022   CL 101 02/06/2022   CO2 28 02/06/2022    Lab Results  Component Value Date   ALT 24 02/06/2022   AST 25 02/06/2022   ALKPHOS 87 01/28/2022   BILITOT 0.2 02/06/2022    Lab Results  Component Value Date   CHOL 138 07/03/2021   HDL 34 (L) 07/03/2021   LDLCALC 85 07/03/2021   TRIG 95 07/03/2021   CHOLHDL 4.1 07/03/2021   Lab Results  Component Value Date   LABRPR NON REACTIVE 07/03/2021   No results found for: "HIV1RNAQUANT", "HIV1RNAVL", "CD4TABS"   Problem List Items Addressed This Visit   None    Expand All Collapse All                    Patient Active Problem List    Diagnosis Date Noted   UTI (urinary tract infection) 01/26/2022   Chronic diastolic heart failure ([place]) 01/26/2022   History of orthostatic hypotension 01/26/2022   Dementia ([place]) 11/30/2021   Pressure injury of sacral region, stage 2 ([place]) 11/30/2021   Multiple fractures of ribs, left side, initial encounter for closed fracture 11/29/2021   Failure to thrive  in adult 11/29/2021   Anemia of chronic disease 09/10/2021   Catheter-associated urinary tract infection ([place]) 08/27/2021   Goals of care, counseling/discussion 08/26/2021   Delirium 08/23/2021   Confusion 08/23/2021   Unintentional weight loss of more than 10% body weight within 6 months 08/21/2021   Hemorrhoids 08/21/2021   Diverticulosis of colon 08/21/2021   Solitary rectal ulcer 08/21/2021   Polyp of ascending colon 08/21/2021   Hypokalemia 08/21/2021   Acute blood loss anemia 08/20/2021   Chronic orthostatic hypotension 08/20/2021    Protein-calorie malnutrition, severe ([place]) 08/20/2021   Hypomagnesemia 08/20/2021   Physical deconditioning 08/20/2021   Acute lower GI bleeding 08/19/2021   Pressure injury of skin 07/02/2021   Severe sepsis ([place]) 06/30/2021   Hypothermia 06/30/2021   Urinary retention 04/23/2021   Acute prerenal azotemia 04/22/2021   Leukocytosis 04/22/2021   HLD (hyperlipidemia) 25/63/8937   Acute metabolic encephalopathy 34/28/7681   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/23/2021   DM2 (diabetes mellitus, type 2) ([place]) 03/23/2021   BPH (benign prostatic hyperplasia) 02/08/2021   Phimosis 01/27/2021   Hypertension 12/23/2020   CAD S/p remote CABG 12/23/2020          Patient's Medications  New Prescriptions    No medications on file  Previous Medications    ASPIRIN EC 81 MG TABLET    Take 1 tablet (81 mg total) by mouth daily. Swallow whole.    BLOOD GLUCOSE METER KIT AND SUPPLIES    Dispense based on patient and insurance preference. Use up to four times daily as directed. (FOR ICD-10 E10.9, E11.9).    DOCUSATE SODIUM (COLACE) 100 MG CAPSULE    Take 100 mg by mouth at bedtime.    DRONABINOL (MARINOL) 5 MG CAPSULE    Take 5 mg by mouth 2 (two) times daily before a meal.    EZETIMIBE (ZETIA) 10 MG TABLET    Take 1 tablet (10 mg total) by mouth daily.    GLUCERNA (GLUCERNA) LIQD    Take 237 mLs by mouth 3 (three) times daily with meals.    HYDROCORTISONE (ANUSOL-HC) 2.5 % RECTAL CREAM    Place rectally 2 (two) times daily.    INSULIN GLARGINE-YFGN (SEMGLEE) 100 UNIT/ML INJECTION    Inject 0.18 mLs (18 Units total) into the skin daily.    INSULIN LISPRO (HUMALOG) 100 UNIT/ML KWIKPEN    Inject 2-10 Units into the skin See admin instructions. Inject 2-10 units into the skin daily with meals, PER SLIDING SCALE: BGL 140-199 = 2 units; 200-250 = 4 units; 251-299 = 6 units; 300-349 = 8 units; 350+ = 10 units; <70 or >350, CALL MD    LIDO-PE-GLYCERIN-PETROLATUM (PREPARATION H RAPID RELIEF) 5-0.25-14.4-15 % CREA    Place 1  Application rectally every 6 (six) hours as needed (hemorrhoids).    LOPERAMIDE (IMODIUM) 2 MG CAPSULE    Take 1 capsule (2 mg total) by mouth as needed for diarrhea or loose stools.    MAGNESIUM OXIDE (MAG-OX) 400 (240 MG) MG TABLET    Take 400 mg by mouth daily.    METHENAMINE (HIPREX) 1 G TABLET    Take 1 g by mouth 2 (two) times daily with a meal.    MIDODRINE (PROAMATINE) 10 MG TABLET    Take 1 tablet (10 mg total) by mouth 3 (three) times daily with meals.    MULTIPLE VITAMIN (MULTIVITAMIN WITH MINERALS) TABS TABLET    Take 1 tablet by mouth daily.    NON FORMULARY    Take 118 mLs  by mouth See admin instructions. Magic Cup desert cup- Eat 1 "cup" (118 ml's) by mouth 2 times a day- with lunch and supper    PANTOPRAZOLE (PROTONIX) 40 MG TABLET    Take 1 tablet (40 mg total) by mouth daily. Any generic PPI is okay to dispense    POLYETHYLENE GLYCOL (MIRALAX / GLYCOLAX) 17 G PACKET    Take 17 g by mouth daily.    ROSUVASTATIN (CRESTOR) 20 MG TABLET    Take 20 mg by mouth daily.    THIAMINE 100 MG TABLET    Take 1 tablet (100 mg total) by mouth daily.  Modified Medications    No medications on file  Discontinued Medications    No medications on file      Subjective: 61 year old male with chronic indwelling catheter tenderness to repair UTI, heart failure, dementia, diabetes, anemia of chronic disease presents for hospital follow-up of Providencia rettgeri bacteremia secondary to UTI     He was admitted to [facility] 11/16 - 11/20 for sepsis secondary to UTI.  On arrival he had a temp of 101, WBC 13 K.  Initially started on pip-tazo transition meropenem as urine cultures grew Providencia rettgeri ESBL.  Infectious diseases engaged patient transition to ertapenem to complete 10 days of antibiotics for complicated UTI EOT 88/28.  Opted for a longer course of treatment due to chronic indwelling Foley and history of recurrent UTI.  Recommended urology follow-up on discharge. Today: Patient denies  fever, chills.  He has a catheter in place and states things has urology follow-up.  PICC line in place no issues. Review of Systems: ROS       Past Medical History:  Diagnosis Date   Arthritis     Chronic diastolic heart failure (HCC)     Dementia (HCC)     Diabetes mellitus without complication (HCC)     Heart disease     History of recurrent UTIs      (has chronic indwelling foley catheter)   Hypertension        Social History         Tobacco Use   Smoking status: Never   Smokeless tobacco: Never  Vaping Use   Vaping Use: Never used  Substance Use Topics   Alcohol use: Not Currently      Comment: none in 6 months may open a bottle of wine tonight 11/1112022   Drug use: Never           Family History  Problem Relation Age of Onset   Diabetes Mother     Hypertension Mother     Diabetes Father     Hypertension Father     Heart attack Father     Diabetes Sister     Diabetes Sister        No Known Allergies       Health Maintenance  Topic Date Due   COVID-19 Vaccine (1) Never done   FOOT EXAM  Never done   OPHTHALMOLOGY EXAM  Never done   Diabetic kidney evaluation - Urine ACR  Never done   INFLUENZA VACCINE  10/11/2021   HEMOGLOBIN A1C  07/28/2022   Diabetic kidney evaluation - GFR measurement  01/31/2023   COLONOSCOPY (Pts 45-75yr Insurance coverage will need to be confirmed)  08/22/2031   Hepatitis C Screening  Completed   HIV Screening  Completed   Zoster Vaccines- Shingrix  Completed   HPV VACCINES  Aged Out      Objective:  There were no vitals filed for this visit. There is no height or weight on file to calculate BMI.   Physical Exam   Lab Results Recent Labs       Lab Results  Component Value Date    WBC 9.2 01/28/2022    HGB 10.0 (L) 01/28/2022    HCT 31.2 (L) 01/28/2022    MCV 85.7 01/28/2022    PLT 277 01/28/2022      Recent Labs       Lab Results  Component Value Date    CREATININE 0.54 (L) 01/30/2022    BUN 12  01/30/2022    NA 133 (L) 01/30/2022    K 4.6 01/30/2022    CL 100 01/30/2022    CO2 26 01/30/2022      Recent Labs       Lab Results  Component Value Date    ALT 26 01/28/2022    AST 29 01/28/2022    ALKPHOS 87 01/28/2022    BILITOT 0.4 01/28/2022      Recent Labs       Lab Results  Component Value Date    CHOL 138 07/03/2021    HDL 34 (L) 07/03/2021    LDLCALC 85 07/03/2021    TRIG 95 07/03/2021    CHOLHDL 4.1 07/03/2021      Recent Labs       Lab Results  Component Value Date    LABRPR NON REACTIVE 07/03/2021      Last Labs  No results found for: "HIV1RNAQUANT", "HIV1RNAVL", "CD4TABS"         Assessment/Plan: 60 year old male with chronic indwelling Foley presents for hospital follow-up of: #Sepsis secondary to bacteremia/UTI #Providencia rettgeri bacteremia secondary to UTI #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  Regardless he was treated with 3 days of ceftriaxone which would have been sufficient. - Discharged on ertapenem to complete 10 weeks of antibiotics for complicated UTI. PICC was pulled.  -Per Pt he was seen by Urology in the interim.    Plan: - Repeat labs today as wbc 12.6 and  crp elevated at last visit could be in the setting of recent illness.  - Follow-up with ID PRN -Continue to follow-with Urology   I have personally spent 31 minutes involved in face-to-face and non-face-to-face activities for this patient on the day of the visit. Professional time spent includes the following activities: Preparing to see the patient (review of tests), Obtaining and/or reviewing separately obtained history (admission/discharge record), Performing a medically appropriate examination and/or evaluation , Ordering medications/tests/procedures, referring and communicating with other health care professionals, Documenting clinical information in the EMR, Independently  interpreting results (not separately reported), Communicating results to the patient/family/caregiver, Counseling and educating the patient/family/caregiver and Care coordination (not separately reported).    Laurice Record, MD [place] [facility] 02/20/2022, 9:10 AM

## 2022-02-20 NOTE — Telephone Encounter (Signed)
Made two attempts to speak with patient's RN at [facility] [phone]). Initially placed on hold; call never picked up. Second attempt went to a generic voicemail. Did not leave a message. Report of consultation, AVS, and copy of visit note sent back with patient (patient aware).  Labs collected today. Reviewed AVS with patient, who verbalized understanding to follow up only if needed.  Wyvonne Lenz, RN

## 2022-02-20 NOTE — Telephone Encounter (Signed)
Patient rescheduled in error for 02/20/22 (picc already removed). Attempted to contact patient and SNF twice [facility], [phone]). No answer. Left HIPAA-compliant voicemail for patient, and also left voicemail for facility requesting call back.   Wyvonne Lenz, RN

## 2022-02-21 LAB — CBC WITH DIFFERENTIAL/PLATELET
Absolute Monocytes: 1388 cells/uL — ABNORMAL HIGH (ref 200–950)
Basophils Absolute: 74 cells/uL (ref 0–200)
Basophils Relative: 0.4 %
Eosinophils Absolute: 204 cells/uL (ref 15–500)
Eosinophils Relative: 1.1 %
HCT: 35.1 % — ABNORMAL LOW (ref 38.5–50.0)
Hemoglobin: 11.5 g/dL — ABNORMAL LOW (ref 13.2–17.1)
Lymphs Abs: 1240 cells/uL (ref 850–3900)
MCH: 27.8 pg (ref 27.0–33.0)
MCHC: 32.8 g/dL (ref 32.0–36.0)
MCV: 84.8 fL (ref 80.0–100.0)
MPV: 9.6 fL (ref 7.5–12.5)
Monocytes Relative: 7.5 %
Neutro Abs: 15596 cells/uL — ABNORMAL HIGH (ref 1500–7800)
Neutrophils Relative %: 84.3 %
Platelets: 382 10*3/uL (ref 140–400)
RBC: 4.14 10*6/uL — ABNORMAL LOW (ref 4.20–5.80)
RDW: 14.5 % (ref 11.0–15.0)
Total Lymphocyte: 6.7 %
WBC: 18.5 10*3/uL — ABNORMAL HIGH (ref 3.8–10.8)

## 2022-02-21 LAB — COMPLETE METABOLIC PANEL WITH GFR
AG Ratio: 0.8 (calc) — ABNORMAL LOW (ref 1.0–2.5)
ALT: 16 U/L (ref 9–46)
AST: 16 U/L (ref 10–35)
Albumin: 3.3 g/dL — ABNORMAL LOW (ref 3.6–5.1)
Alkaline phosphatase (APISO): 100 U/L (ref 35–144)
BUN/Creatinine Ratio: 15 (calc) (ref 6–22)
BUN: 10 mg/dL (ref 7–25)
CO2: 29 mmol/L (ref 20–32)
Calcium: 8.8 mg/dL (ref 8.6–10.3)
Chloride: 102 mmol/L (ref 98–110)
Creat: 0.66 mg/dL — ABNORMAL LOW (ref 0.70–1.30)
Globulin: 4.1 g/dL (calc) — ABNORMAL HIGH (ref 1.9–3.7)
Glucose, Bld: 118 mg/dL — ABNORMAL HIGH (ref 65–99)
Potassium: 3.5 mmol/L (ref 3.5–5.3)
Sodium: 138 mmol/L (ref 135–146)
Total Bilirubin: 0.3 mg/dL (ref 0.2–1.2)
Total Protein: 7.4 g/dL (ref 6.1–8.1)
eGFR: 108 mL/min/{1.73_m2} (ref >=60)

## 2022-02-21 LAB — SEDIMENTATION RATE: Sed Rate: 74 mm/h — ABNORMAL HIGH (ref 0–20)

## 2022-02-21 LAB — C-REACTIVE PROTEIN: CRP: 39.1 mg/L — ABNORMAL HIGH (ref <8.0)

## 2022-02-26 ENCOUNTER — Other Ambulatory Visit: Payer: Self-pay | Admitting: Internal Medicine

## 2022-02-26 DIAGNOSIS — D72829 Elevated white blood cell count, unspecified: Secondary | ICD-10-CM

## 2022-02-26 NOTE — Progress Notes (Signed)
Referral to heme-onc

## 2022-02-27 ENCOUNTER — Telehealth: Payer: Self-pay

## 2022-02-27 NOTE — Telephone Encounter (Signed)
Attempted to call [facility] to relay verbal orders for blood cultures x 2 per Dr. Thedore Mins. No answer and unable to leave voicemail. Will also need to let them know about heme-onc referral.   SNF [phone]  Sandie Ano, RN

## 2022-02-27 NOTE — Telephone Encounter (Signed)
-----   Message from Danelle Earthly, MD sent at 02/26/2022  4:45 PM EST ----- Wbc is elevated. Will refer to heme-onc as pt is generally fatigued, I think should be evaluated for underlying malignancy.Triage: Please have SNF draw blood Cx x 2.

## 2022-02-28 NOTE — Telephone Encounter (Signed)
Orders given to [place] S, RN blood cultures x2 and heme-onc referral. Orders read back and understood. Orders also faxed to [phone]

## 2022-03-08 ENCOUNTER — Encounter (HOSPITAL_COMMUNITY): Payer: Self-pay

## 2022-03-08 ENCOUNTER — Inpatient Hospital Stay (HOSPITAL_COMMUNITY)
Admission: EM | Admit: 2022-03-08 | Discharge: 2022-03-15 | DRG: 698 | Disposition: A | Discharge status: Skilled Nursing Facility | Payer: Commercial Managed Care - HMO | Source: Skilled Nursing Facility | Attending: Family Medicine | Admitting: Family Medicine

## 2022-03-08 ENCOUNTER — Emergency Department (HOSPITAL_COMMUNITY): Payer: Commercial Managed Care - HMO

## 2022-03-08 ENCOUNTER — Other Ambulatory Visit: Payer: Self-pay

## 2022-03-08 DIAGNOSIS — R5381 Other malaise: Secondary | ICD-10-CM | POA: Diagnosis present

## 2022-03-08 DIAGNOSIS — Z833 Family history of diabetes mellitus: Secondary | ICD-10-CM

## 2022-03-08 DIAGNOSIS — R188 Other ascites: Secondary | ICD-10-CM | POA: Diagnosis present

## 2022-03-08 DIAGNOSIS — R29818 Other symptoms and signs involving the nervous system: Secondary | ICD-10-CM | POA: Diagnosis present

## 2022-03-08 DIAGNOSIS — N390 Urinary tract infection, site not specified: Secondary | ICD-10-CM | POA: Diagnosis present

## 2022-03-08 DIAGNOSIS — E43 Unspecified severe protein-calorie malnutrition: Secondary | ICD-10-CM | POA: Diagnosis present

## 2022-03-08 DIAGNOSIS — I5032 Chronic diastolic (congestive) heart failure: Secondary | ICD-10-CM | POA: Diagnosis present

## 2022-03-08 DIAGNOSIS — T83511A Infection and inflammatory reaction due to indwelling urethral catheter, initial encounter: Secondary | ICD-10-CM | POA: Diagnosis not present

## 2022-03-08 DIAGNOSIS — L8990 Pressure ulcer of unspecified site, unspecified stage: Secondary | ICD-10-CM | POA: Diagnosis present

## 2022-03-08 DIAGNOSIS — K861 Other chronic pancreatitis: Secondary | ICD-10-CM | POA: Diagnosis present

## 2022-03-08 DIAGNOSIS — M009 Pyogenic arthritis, unspecified: Secondary | ICD-10-CM

## 2022-03-08 DIAGNOSIS — B9620 Unspecified Escherichia coli [E. coli] as the cause of diseases classified elsewhere: Secondary | ICD-10-CM | POA: Diagnosis present

## 2022-03-08 DIAGNOSIS — Z66 Do not resuscitate: Secondary | ICD-10-CM | POA: Diagnosis present

## 2022-03-08 DIAGNOSIS — L89152 Pressure ulcer of sacral region, stage 2: Secondary | ICD-10-CM | POA: Diagnosis present

## 2022-03-08 DIAGNOSIS — Z951 Presence of aortocoronary bypass graft: Secondary | ICD-10-CM

## 2022-03-08 DIAGNOSIS — E785 Hyperlipidemia, unspecified: Secondary | ICD-10-CM | POA: Diagnosis present

## 2022-03-08 DIAGNOSIS — N12 Tubulo-interstitial nephritis, not specified as acute or chronic: Principal | ICD-10-CM

## 2022-03-08 DIAGNOSIS — M4854XA Collapsed vertebra, not elsewhere classified, thoracic region, initial encounter for fracture: Secondary | ICD-10-CM | POA: Diagnosis present

## 2022-03-08 DIAGNOSIS — R296 Repeated falls: Secondary | ICD-10-CM | POA: Diagnosis present

## 2022-03-08 DIAGNOSIS — Z79899 Other long term (current) drug therapy: Secondary | ICD-10-CM

## 2022-03-08 DIAGNOSIS — R627 Adult failure to thrive: Secondary | ICD-10-CM | POA: Diagnosis present

## 2022-03-08 DIAGNOSIS — Y846 Urinary catheterization as the cause of abnormal reaction of the patient, or of later complication, without mention of misadventure at the time of the procedure: Secondary | ICD-10-CM | POA: Diagnosis present

## 2022-03-08 DIAGNOSIS — Z1612 Extended spectrum beta lactamase (ESBL) resistance: Secondary | ICD-10-CM | POA: Diagnosis present

## 2022-03-08 DIAGNOSIS — I951 Orthostatic hypotension: Secondary | ICD-10-CM | POA: Diagnosis present

## 2022-03-08 DIAGNOSIS — N10 Acute tubulo-interstitial nephritis: Secondary | ICD-10-CM | POA: Diagnosis present

## 2022-03-08 DIAGNOSIS — Z681 Body mass index (BMI) 19 or less, adult: Secondary | ICD-10-CM

## 2022-03-08 DIAGNOSIS — S7292XA Unspecified fracture of left femur, initial encounter for closed fracture: Secondary | ICD-10-CM

## 2022-03-08 DIAGNOSIS — Z8249 Family history of ischemic heart disease and other diseases of the circulatory system: Secondary | ICD-10-CM

## 2022-03-08 DIAGNOSIS — R339 Retention of urine, unspecified: Secondary | ICD-10-CM

## 2022-03-08 DIAGNOSIS — N3289 Other specified disorders of bladder: Secondary | ICD-10-CM | POA: Diagnosis present

## 2022-03-08 DIAGNOSIS — K5900 Constipation, unspecified: Secondary | ICD-10-CM | POA: Diagnosis present

## 2022-03-08 DIAGNOSIS — E119 Type 2 diabetes mellitus without complications: Secondary | ICD-10-CM

## 2022-03-08 DIAGNOSIS — G8929 Other chronic pain: Secondary | ICD-10-CM | POA: Diagnosis present

## 2022-03-08 DIAGNOSIS — I110 Hypertensive heart disease with heart failure: Secondary | ICD-10-CM | POA: Diagnosis present

## 2022-03-08 LAB — COMPREHENSIVE METABOLIC PANEL
ALT: 17 U/L (ref 0–44)
AST: 24 U/L (ref 15–41)
Albumin: 2.5 g/dL — ABNORMAL LOW (ref 3.5–5.0)
Alkaline Phosphatase: 90 U/L (ref 38–126)
Anion gap: 13 (ref 5–15)
BUN: 18 mg/dL (ref 6–20)
CO2: 22 mmol/L (ref 22–32)
Calcium: 9.2 mg/dL (ref 8.9–10.3)
Chloride: 100 mmol/L (ref 98–111)
Creatinine, Ser: 0.99 mg/dL (ref 0.61–1.24)
GFR, Estimated: >60 mL/min (ref >60)
Glucose, Bld: 203 mg/dL — ABNORMAL HIGH (ref 70–99)
Potassium: 4.4 mmol/L (ref 3.5–5.1)
Sodium: 135 mmol/L (ref 135–145)
Total Bilirubin: 0.5 mg/dL (ref 0.3–1.2)
Total Protein: 7.6 g/dL (ref 6.5–8.1)

## 2022-03-08 LAB — LACTIC ACID, PLASMA: Lactic Acid, Venous: 1.3 mmol/L (ref 0.5–1.9)

## 2022-03-08 LAB — CBC WITH DIFFERENTIAL/PLATELET
Abs Immature Granulocytes: 0.06 10*3/uL (ref 0.00–0.07)
Basophils Absolute: 0.1 10*3/uL (ref 0.0–0.1)
Basophils Relative: 0 %
Eosinophils Absolute: 0.3 10*3/uL (ref 0.0–0.5)
Eosinophils Relative: 2 %
HCT: 31.8 % — ABNORMAL LOW (ref 39.0–52.0)
Hemoglobin: 10.5 g/dL — ABNORMAL LOW (ref 13.0–17.0)
Immature Granulocytes: 0 %
Lymphocytes Relative: 11 %
Lymphs Abs: 1.7 10*3/uL (ref 0.7–4.0)
MCH: 27.4 pg (ref 26.0–34.0)
MCHC: 33 g/dL (ref 30.0–36.0)
MCV: 83 fL (ref 80.0–100.0)
Monocytes Absolute: 1.1 10*3/uL — ABNORMAL HIGH (ref 0.1–1.0)
Monocytes Relative: 7 %
Neutro Abs: 12 10*3/uL — ABNORMAL HIGH (ref 1.7–7.7)
Neutrophils Relative %: 80 %
Platelets: 455 10*3/uL — ABNORMAL HIGH (ref 150–400)
RBC: 3.83 MIL/uL — ABNORMAL LOW (ref 4.22–5.81)
RDW: 14.9 % (ref 11.5–15.5)
WBC: 15.1 10*3/uL — ABNORMAL HIGH (ref 4.0–10.5)
nRBC: 0 % (ref 0.0–0.2)

## 2022-03-08 LAB — GLUCOSE, CAPILLARY
Glucose-Capillary: 160 mg/dL — ABNORMAL HIGH (ref 70–99)
Glucose-Capillary: 248 mg/dL — ABNORMAL HIGH (ref 70–99)

## 2022-03-08 LAB — URINALYSIS, ROUTINE W REFLEX MICROSCOPIC
Bilirubin Urine: NEGATIVE
Glucose, UA: 150 mg/dL — AB
Ketones, ur: NEGATIVE mg/dL
Nitrite: POSITIVE — AB
Protein, ur: 100 mg/dL — AB
RBC / HPF: >50 RBC/hpf — ABNORMAL HIGH (ref 0–5)
Specific Gravity, Urine: 1.015 (ref 1.005–1.030)
WBC, UA: >50 WBC/hpf — ABNORMAL HIGH (ref 0–5)
pH: 5 (ref 5.0–8.0)

## 2022-03-08 LAB — MRSA NEXT GEN BY PCR, NASAL: MRSA by PCR Next Gen: DETECTED — AB

## 2022-03-08 LAB — LIPASE, BLOOD: Lipase: 29 U/L (ref 11–51)

## 2022-03-08 MED ORDER — OXYCODONE HCL 5 MG PO TABS
5.0000 mg | ORAL_TABLET | ORAL | Status: DC | PRN
  Administered 2022-03-09: 5 mg via ORAL
  Filled 2022-03-08: qty 1

## 2022-03-08 MED ORDER — ONDANSETRON HCL 4 MG PO TABS: 4.0000 mg | ORAL_TABLET | Freq: Four times a day (QID) | ORAL | Status: DC | PRN

## 2022-03-08 MED ORDER — INSULIN ASPART 100 UNIT/ML IJ SOLN
0.0000 [IU] | Freq: Three times a day (TID) | INTRAMUSCULAR | Status: DC
  Administered 2022-03-08: 3 [IU] via SUBCUTANEOUS
  Administered 2022-03-08: 2 [IU] via SUBCUTANEOUS
  Administered 2022-03-09: 5 [IU] via SUBCUTANEOUS
  Administered 2022-03-09 – 2022-03-10 (×3): 2 [IU] via SUBCUTANEOUS
  Administered 2022-03-10 – 2022-03-11 (×4): 1 [IU] via SUBCUTANEOUS
  Administered 2022-03-12: 3 [IU] via SUBCUTANEOUS
  Administered 2022-03-12 – 2022-03-13 (×2): 2 [IU] via SUBCUTANEOUS
  Administered 2022-03-13: 1 [IU] via SUBCUTANEOUS
  Administered 2022-03-14 – 2022-03-15 (×3): 3 [IU] via SUBCUTANEOUS

## 2022-03-08 MED ORDER — ACETAMINOPHEN 650 MG RE SUPP: 650.0000 mg | Freq: Four times a day (QID) | RECTAL | Status: DC | PRN

## 2022-03-08 MED ORDER — THIAMINE MONONITRATE 100 MG PO TABS
100.0000 mg | ORAL_TABLET | Freq: Every day | ORAL | Status: DC
  Administered 2022-03-08 – 2022-03-15 (×8): 100 mg via ORAL
  Filled 2022-03-08 (×9): qty 1

## 2022-03-08 MED ORDER — PANTOPRAZOLE SODIUM 40 MG PO TBEC
40.0000 mg | DELAYED_RELEASE_TABLET | Freq: Every day | ORAL | Status: DC
  Administered 2022-03-08 – 2022-03-15 (×8): 40 mg via ORAL
  Filled 2022-03-08 (×8): qty 1

## 2022-03-08 MED ORDER — INSULIN GLARGINE-YFGN 100 UNIT/ML ~~LOC~~ SOLN
18.0000 [IU] | Freq: Every day | SUBCUTANEOUS | Status: DC
  Administered 2022-03-08 – 2022-03-12 (×5): 18 [IU] via SUBCUTANEOUS
  Filled 2022-03-08 (×5): qty 0.18

## 2022-03-08 MED ORDER — DOCUSATE SODIUM 100 MG PO CAPS
100.0000 mg | ORAL_CAPSULE | Freq: Two times a day (BID) | ORAL | Status: DC
  Administered 2022-03-08 – 2022-03-14 (×4): 100 mg via ORAL
  Filled 2022-03-08 (×8): qty 1

## 2022-03-08 MED ORDER — DRONABINOL 2.5 MG PO CAPS
5.0000 mg | ORAL_CAPSULE | Freq: Two times a day (BID) | ORAL | Status: DC
  Administered 2022-03-08 – 2022-03-15 (×15): 5 mg via ORAL
  Filled 2022-03-08 (×15): qty 2

## 2022-03-08 MED ORDER — ENOXAPARIN SODIUM 300 MG/3ML IJ SOLN
20.0000 mg | INTRAMUSCULAR | Status: DC
  Administered 2022-03-08 – 2022-03-15 (×8): 20 mg via SUBCUTANEOUS
  Filled 2022-03-08 (×9): qty 0.2

## 2022-03-08 MED ORDER — MIDODRINE HCL 5 MG PO TABS
10.0000 mg | ORAL_TABLET | Freq: Three times a day (TID) | ORAL | Status: DC
  Administered 2022-03-08 – 2022-03-15 (×21): 10 mg via ORAL
  Filled 2022-03-08 (×21): qty 2

## 2022-03-08 MED ORDER — HYDRALAZINE HCL 20 MG/ML IJ SOLN: 5.0000 mg | INTRAMUSCULAR | Status: DC | PRN

## 2022-03-08 MED ORDER — ADULT MULTIVITAMIN W/MINERALS CH
1.0000 | ORAL_TABLET | Freq: Every day | ORAL | Status: DC
  Administered 2022-03-09 – 2022-03-15 (×7): 1 via ORAL
  Filled 2022-03-08 (×7): qty 1

## 2022-03-08 MED ORDER — EZETIMIBE 10 MG PO TABS
10.0000 mg | ORAL_TABLET | Freq: Every day | ORAL | Status: DC
  Administered 2022-03-08 – 2022-03-15 (×8): 10 mg via ORAL
  Filled 2022-03-08 (×8): qty 1

## 2022-03-08 MED ORDER — ROSUVASTATIN CALCIUM 20 MG PO TABS
20.0000 mg | ORAL_TABLET | Freq: Every day | ORAL | Status: DC
  Administered 2022-03-08 – 2022-03-15 (×8): 20 mg via ORAL
  Filled 2022-03-08 (×8): qty 1

## 2022-03-08 MED ORDER — SODIUM CHLORIDE 0.9 % IV BOLUS
500.0000 mL | Freq: Once | INTRAVENOUS | Status: AC
  Administered 2022-03-08: 500 mL via INTRAVENOUS

## 2022-03-08 MED ORDER — ASPIRIN 81 MG PO TBEC
81.0000 mg | DELAYED_RELEASE_TABLET | Freq: Every day | ORAL | Status: DC
  Administered 2022-03-08 – 2022-03-15 (×8): 81 mg via ORAL
  Filled 2022-03-08 (×8): qty 1

## 2022-03-08 MED ORDER — OXYBUTYNIN CHLORIDE 5 MG PO TABS
5.0000 mg | ORAL_TABLET | Freq: Two times a day (BID) | ORAL | Status: DC
  Administered 2022-03-08 – 2022-03-15 (×14): 5 mg via ORAL
  Filled 2022-03-08 (×16): qty 1

## 2022-03-08 MED ORDER — BISACODYL 5 MG PO TBEC: 5.0000 mg | DELAYED_RELEASE_TABLET | Freq: Every day | ORAL | Status: DC | PRN

## 2022-03-08 MED ORDER — ONDANSETRON HCL 4 MG/2ML IJ SOLN: 4.0000 mg | Freq: Four times a day (QID) | INTRAMUSCULAR | Status: DC | PRN

## 2022-03-08 MED ORDER — GLUCERNA SHAKE PO LIQD
237.0000 mL | Freq: Three times a day (TID) | ORAL | Status: DC
  Administered 2022-03-08 – 2022-03-15 (×15): 237 mL via ORAL
  Filled 2022-03-08 (×4): qty 237

## 2022-03-08 MED ORDER — SODIUM CHLORIDE 0.9 % IV SOLN
1.0000 g | Freq: Two times a day (BID) | INTRAVENOUS | Status: DC
  Administered 2022-03-08 – 2022-03-10 (×5): 1 g via INTRAVENOUS
  Filled 2022-03-08 (×6): qty 20

## 2022-03-08 MED ORDER — ACETAMINOPHEN 325 MG PO TABS: 650.0000 mg | ORAL_TABLET | Freq: Four times a day (QID) | ORAL | Status: DC | PRN

## 2022-03-08 MED ORDER — ONDANSETRON HCL 4 MG/2ML IJ SOLN
4.0000 mg | Freq: Once | INTRAMUSCULAR | Status: AC
  Administered 2022-03-08: 4 mg via INTRAVENOUS
  Filled 2022-03-08: qty 2

## 2022-03-08 MED ORDER — MAGNESIUM OXIDE -MG SUPPLEMENT 400 (240 MG) MG PO TABS
400.0000 mg | ORAL_TABLET | Freq: Every day | ORAL | Status: DC
  Administered 2022-03-09 – 2022-03-15 (×7): 400 mg via ORAL
  Filled 2022-03-08 (×7): qty 1

## 2022-03-08 MED ORDER — LACTATED RINGERS IV SOLN: INTRAVENOUS | Status: AC

## 2022-03-08 MED ORDER — TIZANIDINE HCL 4 MG PO TABS: 2.0000 mg | ORAL_TABLET | Freq: Three times a day (TID) | ORAL | Status: DC | PRN

## 2022-03-08 MED ORDER — POLYETHYLENE GLYCOL 3350 17 G PO PACK
17.0000 g | PACK | Freq: Every day | ORAL | Status: DC
  Administered 2022-03-08 – 2022-03-11 (×3): 17 g via ORAL
  Filled 2022-03-08 (×4): qty 1

## 2022-03-08 MED ORDER — SODIUM CHLORIDE 0.9 % IV SOLN
1.0000 g | Freq: Once | INTRAVENOUS | Status: AC
  Administered 2022-03-08: 1 g via INTRAVENOUS
  Filled 2022-03-08: qty 10

## 2022-03-08 MED ORDER — MORPHINE SULFATE (PF) 4 MG/ML IV SOLN
4.0000 mg | Freq: Once | INTRAVENOUS | Status: AC
  Administered 2022-03-08: 4 mg via INTRAVENOUS
  Filled 2022-03-08: qty 1

## 2022-03-08 MED ORDER — METHENAMINE HIPPURATE 1 G PO TABS: 1.0000 g | ORAL_TABLET | Freq: Two times a day (BID) | ORAL | Status: DC

## 2022-03-08 MED ORDER — IOHEXOL 350 MG/ML SOLN
65.0000 mL | Freq: Once | INTRAVENOUS | Status: AC | PRN
  Administered 2022-03-08: 65 mL via INTRAVENOUS

## 2022-03-08 NOTE — Progress Notes (Signed)
Pharmacy Antibiotic Note  Lucian Baswell is a 61 y.o. male admitted on 03/08/2022 with UTI . Pharmacy has been consulted for Meropenem dosing.  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 Cefepime 1gm IV given in ED this am. Creatinine is up some from baseline 0.5-0.7 last admit. Chronic indwelling foley, exchanged today.  Plan: Meropenem 1gm IV q12hrs. Follow renal function, culture data, clinical progress and antibiotic plans.  Height: 5\' 6"  (167.6 cm) Weight: 40.8 kg (90 lb) IBW/kg (Calculated) : 63.8  Temp (24hrs), Avg:98.8 F (37.1 C), Min:98.1 F (36.7 C), Max:100.4 F (38 C)  Recent Labs  Lab 03/08/22 0301  WBC 15.1*  CREATININE 0.99  LATICACIDVEN 1.3    Estimated Creatinine Clearance: 46.4 mL/min (by C-G formula based on SCr of 0.99 mg/dL).    No Known Allergies  Antimicrobials this admission: Cefepime x 1 on 12/27 Meropenem 12/27 >>  Dose adjustments this admission: n/a  Microbiology results: 12/27 blood: 12/27 urine: 12/27 MRSA PCR:  Thank you for allowing pharmacy to be a part of this patient's care.  1/28, Dennie Fetters 03/08/2022 2:33 PM

## 2022-03-08 NOTE — ED Provider Notes (Signed)
Emergency Department Provider Note   I have reviewed the triage vital signs and the nursing notes.   HISTORY  Chief Complaint Abdominal Pain (BIB EMS from [facility] for c/o abd pain x4 days, mostly L sided pain, pt reports it feels like his bladder is going to explode, has indwelling foley in place, A&O x4, hx of dementia and UTI's)   HPI Charles [name] is a 61 y.o. 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 He feels pain in the lower abdomen without radiation to the upper abdomen or chest. No SOB. No fever/chills. No vomiting. He has not had issues with foley malfunction recently and is currently staying at a SNF.    Past Medical History:  Diagnosis Date   Arthritis    Chronic diastolic heart failure (HCC)    Dementia (HCC)    Diabetes mellitus without complication (HCC)    Heart disease    History of recurrent UTIs    (has chronic indwelling foley catheter)   Hypertension     Review of Systems  Constitutional: No fever/chills Eyes: No visual changes. ENT: No sore throat. Cardiovascular: Denies chest pain. Respiratory: Denies shortness of breath. Gastrointestinal: Positive abdominal pain.  No nausea, no vomiting.  No diarrhea.  No constipation. Musculoskeletal: Negative for back pain. Skin: Negative for rash. Neurological: Negative for headaches.  ____________________________________________   PHYSICAL EXAM:  VITAL SIGNS: ED Triage Vitals  Enc Vitals Group     BP 03/08/22 0246 116/70     Pulse Rate 03/08/22 0246 96     Resp 03/08/22 0246 15     Temp 03/08/22 0246 98.1 F (36.7 C)     Temp Source 03/08/22 0246 Oral     SpO2 03/08/22 0246 99 %     Weight 03/08/22 0247 90 lb (40.8 kg)     Height 03/08/22 0247 5\' 6"  (1.676 m)   Constitutional: Alert and oriented. Well appearing and in no  acute distress. Eyes: Conjunctivae are normal.  Head: Atraumatic. Nose: No congestion/rhinnorhea. Mouth/Throat: Mucous membranes are moist.  Neck: No stridor.   Cardiovascular: Normal rate, regular rhythm. Good peripheral circulation. Grossly normal heart sounds.   Respiratory: Normal respiratory effort.  No retractions. Lungs CTAB. Gastrointestinal: Soft with tenderness in the lower abdomen and guarding. Soft upper abdomen. No distention.  Musculoskeletal: No lower extremity tenderness nor edema. No gross deformities of extremities. Neurologic:  Normal speech and language. Skin:  Skin is warm, dry and intact. No rash noted.  ____________________________________________   LABS (all labs ordered are listed, but only abnormal results are displayed)  Labs Reviewed  COMPREHENSIVE METABOLIC PANEL - Abnormal; Notable for the following components:      Result Value   Glucose, Bld 203 (*)    Albumin 2.5 (*)    All other components within normal limits  CBC WITH DIFFERENTIAL/PLATELET - Abnormal; Notable for the following components:   WBC 15.1 (*)    RBC 3.83 (*)    Hemoglobin 10.5 (*)    HCT 31.8 (*)    Platelets 455 (*)    Neutro Abs 12.0 (*)    Monocytes Absolute 1.1 (*)    All other components within normal limits  URINALYSIS, ROUTINE W REFLEX MICROSCOPIC - Abnormal; Notable for the following components:   Color, Urine AMBER (*)    APPearance TURBID (*)  Glucose, UA 150 (*)    Hgb urine dipstick MODERATE (*)    Protein, ur 100 (*)    Nitrite POSITIVE (*)    Leukocytes,Ua LARGE (*)    RBC / HPF >50 (*)    WBC, UA >50 (*)    Bacteria, UA FEW (*)    All other components within normal limits  URINE CULTURE  CULTURE, BLOOD (ROUTINE X 2)  CULTURE, BLOOD (ROUTINE X 2)  LACTIC ACID, PLASMA  LIPASE, BLOOD  LACTIC ACID, PLASMA   ____________________________________________  RADIOLOGY  CT ABDOMEN PELVIS W CONTRAST  Result Date: 03/08/2022 CLINICAL DATA:  Abdominal pain.  EXAM: CT ABDOMEN AND PELVIS WITH CONTRAST TECHNIQUE: Multidetector CT imaging of the abdomen and pelvis was performed using the standard protocol following bolus administration of intravenous contrast. RADIATION DOSE REDUCTION: This exam was performed according to the departmental dose-optimization program which includes automated exposure control, adjustment of the mA and/or kV according to patient size and/or use of iterative reconstruction technique. CONTRAST:  54mL OMNIPAQUE IOHEXOL 350 MG/ML SOLN COMPARISON:  CT without contrast 08/19/2021, CT with IV contrast 07/03/2021 FINDINGS: Lower chest: The cardiac size is normal. There are three-vessel coronary artery calcifications. No pericardial effusion. Lung bases are clear. Hepatobiliary: No focal liver abnormality is seen. No calcified gallstones, gallbladder wall thickening, or biliary dilatation. Pancreas: There are calcifications in the pancreatic head consistent with chronic calcific pancreatitis. There is no evidence of acute inflammatory change, mass enhancement or ductal dilatation. Spleen: Normal. Adrenals/Urinary Tract: There is no adrenal mass. There is faint patchy hypoenhancement right kidney most likely due to pyelonephritis with asymmetric perinephric stranding. There is no urinary stone or hydroureteronephrosis. No renal mass enhancement. There is a 5 mm hypodensity in the inferior pole of the left kidney which is too small to characterize but most likely representing a cyst. No follow-up imaging is recommended. No findings of left-sided pyelonephritis. The bladder is catheterized but again appears diffusely thickened, which was seen previously. Stomach/Bowel: There is fold thickening of the proximal to mid stomach and in some of the left abdominal small bowel. There is no small bowel dilatation or inflammation. The appendix is normal. There is moderate diffuse retained stool. Scattered diverticula noted without evidence of acute diverticulitis.  Vascular/Lymphatic: Aortic atherosclerosis. No enlarged abdominal or pelvic lymph nodes. Reproductive: Mild prostatomegaly. TURP defect appears new from 08/19/2021. Other: There is minimal ascites in the posterior pelvis. Diffuse mesenteric haziness. This could be congestive or due to malnutrition and/or hepatic dysfunction. There is no free hemorrhage, free air or focal acute inflammatory change. Musculoskeletal: First seen on 08/19/2021, again noted is a subcapital right femoral fracture with the distal fragment increasingly moderately cephalad displaced, multiple heterotopic bone formations in the area, and moderate fluid in joint space with bony debris. There are increased lucencies in right femoral which could be due to evolution of osteonecrosis of the femoral head or could be on an infectious basis. There are small scattered erosions of right femoral head articular surface but not of the acetabulum. There are mild chronic upper plate anterior wedge compression fractures of the T12, L1 and L3 vertebral bodies, mild degenerative changes of the spine. There are bridging osteophytes of the SI joints. IMPRESSION: 1. Faint patchy hypoenhancement of the right kidney most likely due to pyelonephritis with asymmetric perinephric stranding. No hydronephrosis or urinary stone. 2. Cystitis versus bladder nondistention. 3. Constipation and diverticulosis. 4. Aortic and coronary artery atherosclerosis. 5. Diffuse mesenteric haziness which could be congestive or due to malnutrition  and/or hepatic dysfunction. Minimal ascites in the posterior pelvis. 6. Chronic calcific pancreatitis, only seen in the pancreatic head. No acute inflammatory change. 7. Subcapital right femoral fracture, first seen on CT abdomen and pelvis 08/19/2021, with increasingly moderately superiorly displaced distal fragment, multiple heterotopic bone formations in the area, and moderate fluid in the joint space with bony debris. 8. Increased lucencies  in the right femoral head are noted today which could be due to evolution of osteonecrosis or could be on an infectious basis. 9. Mild prostatomegaly with new TURP defect. 10. Chronic mild upper plate anterior wedge compression fractures of the T12, L1 and L3 vertebral bodies. Aortic Atherosclerosis (ICD10-I70.0). Electronically Signed   By: Almira Bar M.D.   On: 03/08/2022 06:28    ____________________________________________   PROCEDURES  Procedure(s) performed:   Procedures  None  ____________________________________________   INITIAL IMPRESSION / ASSESSMENT AND PLAN / ED COURSE  Pertinent labs & imaging results that were available during my care of the patient were reviewed by me and considered in my medical decision making (see chart for details).   This patient is Presenting for Evaluation of abdominal pain, which does require a range of treatment options, and is a complaint that involves a high risk of morbidity and mortality.  The Differential Diagnoses includes but is not exclusive to acute appendicitis, renal colic, testicular torsion, urinary tract infection, prostatitis,  diverticulitis, small bowel obstruction, colitis, abdominal aortic aneurysm, gastroenteritis, constipation etc.   Critical Interventions-    Medications  ceFEPIme (MAXIPIME) 1 g in sodium chloride 0.9 % 100 mL IVPB (1 g Intravenous New Bag/Given 03/08/22 0700)  sodium chloride 0.9 % bolus 500 mL (0 mLs Intravenous Stopped 03/08/22 0610)  morphine (PF) 4 MG/ML injection 4 mg (4 mg Intravenous Given 03/08/22 0337)  ondansetron (ZOFRAN) injection 4 mg (4 mg Intravenous Given 03/08/22 0337)  iohexol (OMNIPAQUE) 350 MG/ML injection 65 mL (65 mLs Intravenous Contrast Given 03/08/22 0506)    Reassessment after intervention: Pain symptoms improved with catheter placement.    I did obtain Additional Historical Information from EMS.  I decided to review pertinent External Data, and in summary last ID  note reviewed. PICC removed and meropenem course completed. Urine culture at that time grew Providencia rettgeri ESBL.   Clinical Laboratory Tests Ordered, included UA suggesting UTI. Leukocytosis noted. No AKI.   Radiologic Tests Ordered, included CT abdomen/pelvis. I independently interpreted the images and agree with radiology interpretation.   Cardiac Monitor Tracing which shows NSR.    Social Determinants of Health Risk patient is a non-smoker. Currently living in a SNF.   Consult complete with Hospitalist. Plan for admit.   Medical Decision Making: Summary:  Patient presents to the ED with lower abdominal pain, indwelling foley, and history of ESBL UTI and sepsis within the last month. Foley appears to be functioning but will confirm with bladder scan followed by CT abdomen/pelvis. No SIRS vitals at the moment.   Reevaluation with update and discussion with patient. Foley exchanged with elevated bladder scan. Pain much improved with 1400 ml of urine obtained. Suspect retention was the cause of pain but given medical complexity and recent infection history, will move forward with CT.   CT without hydronephrosis.   Patient's presentation is most consistent with acute presentation with potential threat to life or bodily function.   Disposition: admit  ____________________________________________  FINAL CLINICAL IMPRESSION(S) / ED DIAGNOSES  Final diagnoses:  Pyelonephritis  Urinary retention   Note:  This document was prepared using  [employer] and may include unintentional dictation errors.  Alona Bene, MD, [facility]    Ashanna Heinsohn, Arlyss Repress, MD 03/09/22 0630

## 2022-03-08 NOTE — ED Notes (Signed)
Charles [name],Charles [name]  family Maxine Glenn want you to call with up date.

## 2022-03-08 NOTE — H&P (Signed)
History and Physical    [name] ERX:[id number] DOB: 19-May-1960 DOA: 03/08/2022 DOS: the patient was seen and examined on 03/08/2022 PCP: Pcp, No  Patient coming from: [name]; NOK: Lorrin Goodell, [phone]   Chief Complaint: Abdominal pain  HPI: Isaia Hassell is a 61 y.o. male with medical history significant of progressive neurologic decline, DM, chronic urine retention with indwelling foley presenting with abdominal pain.  He was last admitted from 11/16-20 with [facility], treated with Ertapenem via PICC line.  He reports having had PICC line removed.  He has been doing ok at his facility but started feeling abdominal fullness.  Ultimately, he felt like his bladder would explode.  He feels much better after foley exchange.    ER Course:  Degenerative neurologic issue, has foley.  Recurrent ESBL UTIs.  Bladder scan with 600 cc, swapped out foley.  WBC 15, UA abnormal - ?colonization.  CT with ?pyelo on the right.  Likely active UTI, prior urine cultures with no PO options.       Review of Systems: As mentioned in the history of present illness. All other systems reviewed and are negative. Past Medical History:  Diagnosis Date   Arthritis    Chronic diastolic heart failure (HCC)    Dementia (HCC)    Diabetes mellitus without complication ([place])    Heart disease    History of recurrent UTIs    (has chronic indwelling foley catheter)   Hypertension    Past Surgical History:  Procedure Laterality Date   COLONOSCOPY WITH PROPOFOL N/A 08/21/2021   Procedure: COLONOSCOPY WITH PROPOFOL;  Surgeon: Daryel November, MD;  Location: [place];  Service: Gastroenterology;  Laterality: N/A;   CORONARY ARTERY BYPASS GRAFT     ESOPHAGOGASTRODUODENOSCOPY (EGD) WITH PROPOFOL N/A 08/21/2021   Procedure: ESOPHAGOGASTRODUODENOSCOPY (EGD) WITH PROPOFOL;  Surgeon: Daryel November, MD;  Location: [place];  Service: Gastroenterology;   Laterality: N/A;   POLYPECTOMY  08/21/2021   Procedure: POLYPECTOMY;  Surgeon: Daryel November, MD;  Location: [facility];  Service: Gastroenterology;;   Social History:  reports that he has never smoked. He has never used smokeless tobacco. He reports that he does not currently use alcohol. He reports that he does not use drugs.  No Known Allergies  Family History  Problem Relation Age of Onset   Diabetes Mother    Hypertension Mother    Diabetes Father    Hypertension Father    Heart attack Father    Diabetes Sister    Diabetes Sister     Prior to Admission medications   Medication Sig Start Date End Date Taking? Authorizing Provider  aspirin EC 81 MG tablet Take 1 tablet (81 mg total) by mouth daily. Swallow whole. 09/14/21   Pokhrel, Corrie Mckusick, MD  blood glucose meter kit and supplies Dispense based on patient and insurance preference. Use up to four times daily as directed. (FOR ICD-10 E10.9, E11.9). 12/23/20   de [place], Blondell Reveal, MD  docusate sodium (COLACE) 100 MG capsule Take 100 mg by mouth at bedtime.    [provider]  dronabinol (MARINOL) 5 MG capsule Take 5 mg by mouth 2 (two) times daily before a meal.    [provider]  ezetimibe (ZETIA) 10 MG tablet Take 1 tablet (10 mg total) by mouth daily. 07/07/21   Thurnell Lose, MD  Glucerna (GLUCERNA) LIQD Take 237 mLs by mouth 3 (three) times daily with meals.    [provider]  hydrocortisone (ANUSOL-HC) 2.5 % rectal cream Place rectally 2 (two) times daily. Patient not taking: Reported on 02/20/2022 09/11/21   Pokhrel, Corrie Mckusick, MD  insulin glargine-yfgn (SEMGLEE) 100 UNIT/ML injection Inject 0.18 mLs (18 Units total) into the skin daily. 01/31/22   Shelly Coss, MD  insulin lispro (HUMALOG) 100 UNIT/ML KwikPen Inject 2-10 Units into the skin See admin instructions. Inject 2-10 units into the skin daily with meals, PER SLIDING SCALE: BGL 140-199 = 2 units; 200-250 = 4 units; 251-299 = 6 units;  300-349 = 8 units; 350+ = 10 units; <70 or >350, CALL MD Patient not taking: Reported on 02/20/2022    [provider]  insulin lispro (HUMALOG) 200 UNIT/ML KwikPen Inject into the skin in the morning, at noon, in the evening, and at bedtime. Inject as per sliding scale: if 121-150 = 3 units; 151-200 = 4 units; 201-250 = 7 units; 251-300 = 11 units; 301-350 = 15 units; 351-400 = 20 units. If CBG>400 call MD/NP if < 70 notify MD/NP    [provider]  Lido-PE-Glycerin-Petrolatum (PREPARATION H RAPID RELIEF) 5-0.25-14.4-15 % CREA Place 1 Application rectally every 6 (six) hours as needed (hemorrhoids). Patient not taking: Reported on 02/20/2022    [provider]  loperamide (IMODIUM) 2 MG capsule Take 1 capsule (2 mg total) by mouth as needed for diarrhea or loose stools. 12/05/21   Danford, Suann Larry, MD  magnesium oxide (MAG-OX) 400 (240 Mg) MG tablet Take 400 mg by mouth daily.    [provider]  methenamine (HIPREX) 1 g tablet Take 1 g by mouth 2 (two) times daily with a meal.    [provider]  midodrine (PROAMATINE) 10 MG tablet Take 1 tablet (10 mg total) by mouth 3 (three) times daily with meals. 07/07/21   Thurnell Lose, MD  Multiple Vitamin (MULTIVITAMIN WITH MINERALS) TABS tablet Take 1 tablet by mouth daily. 09/01/21   Hosie Poisson, MD  NON FORMULARY Take 118 mLs by mouth See admin instructions. Magic Cup desert cup- Eat 1 "cup" (118 ml's) by mouth 2 times a day- with lunch and supper    [provider]  pantoprazole (PROTONIX) 40 MG tablet Take 1 tablet (40 mg total) by mouth daily. Any generic PPI is okay to dispense Patient taking differently: Take 40 mg by mouth daily before breakfast. 04/25/21   Rai, Ripudeep K, MD  polyethylene glycol (MIRALAX / GLYCOLAX) 17 g packet Take 17 g by mouth daily. 09/11/21   Pokhrel, Corrie Mckusick, MD  rosuvastatin (CRESTOR) 20 MG tablet Take 20 mg by mouth daily.    [provider]  thiamine  100 MG tablet Take 1 tablet (100 mg total) by mouth daily. 09/01/21   Hosie Poisson, MD  zinc oxide 20 % ointment Apply 1 Application topically 2 (two) times daily. Apply to scrotum topically every shift for excoriation    [provider]    Physical Exam: Vitals:   03/08/22 0815 03/08/22 1000 03/08/22 1115 03/08/22 1653  BP: 132/77 109/84 112/71 108/74  Pulse: 87 (!) 109 (!) 110 (!) 109  Resp: _0 Temp:  98.4 F (36.9 C) (!) 100.4 F (38 C) (!) 100.6 F (38.1 C)  TempSrc:  Oral Oral Oral  SpO2: 100% 100% 100% 100%  Weight:      Height:       General:  Appears frail, chronically ill, pleasant Eyes:   EOMI, normal lids, iris ENT:  grossly normal hearing, lips &  tongue, mmm Neck:  no LAD, masses or thyromegaly Cardiovascular:  RRR, no m/r/g. No LE edema.  Respiratory:   CTA bilaterally with no wheezes/rales/rhonchi.  Normal respiratory effort. Abdomen:  soft, NT, ND Skin:  no rash or induration seen on limited exam Musculoskeletal:  atrophic tone BUE/BLE, no bony abnormality Psychiatric:  blunted mood and affect, speech fluent and appropriate, AOx3 Neurologic:  CN 2-12 grossly intact   Radiological Exams on Admission: Independently reviewed - see discussion in A/P where applicable  CT ABDOMEN PELVIS W CONTRAST  Result Date: 03/08/2022 CLINICAL DATA:  Abdominal pain. EXAM: CT ABDOMEN AND PELVIS WITH CONTRAST TECHNIQUE: Multidetector CT imaging of the abdomen and pelvis was performed using the standard protocol following bolus administration of intravenous contrast. RADIATION DOSE REDUCTION: This exam was performed according to the departmental dose-optimization program which includes automated exposure control, adjustment of the mA and/or kV according to patient size and/or use of iterative reconstruction technique. CONTRAST:  67m OMNIPAQUE IOHEXOL 350 MG/ML SOLN COMPARISON:  CT without contrast 08/19/2021, CT with IV contrast 07/03/2021 FINDINGS: Lower chest: The  cardiac size is normal. There are three-vessel coronary artery calcifications. No pericardial effusion. Lung bases are clear. Hepatobiliary: No focal liver abnormality is seen. No calcified gallstones, gallbladder wall thickening, or biliary dilatation. Pancreas: There are calcifications in the pancreatic head consistent with chronic calcific pancreatitis. There is no evidence of acute inflammatory change, mass enhancement or ductal dilatation. Spleen: Normal. Adrenals/Urinary Tract: There is no adrenal mass. There is faint patchy hypoenhancement right kidney most likely due to pyelonephritis with asymmetric perinephric stranding. There is no urinary stone or hydroureteronephrosis. No renal mass enhancement. There is a 5 mm hypodensity in the inferior pole of the left kidney which is too small to characterize but most likely representing a cyst. No follow-up imaging is recommended. No findings of left-sided pyelonephritis. The bladder is catheterized but again appears diffusely thickened, which was seen previously. Stomach/Bowel: There is fold thickening of the proximal to mid stomach and in some of the left abdominal small bowel. There is no small bowel dilatation or inflammation. The appendix is normal. There is moderate diffuse retained stool. Scattered diverticula noted without evidence of acute diverticulitis. Vascular/Lymphatic: Aortic atherosclerosis. No enlarged abdominal or pelvic lymph nodes. Reproductive: Mild prostatomegaly. TURP defect appears new from 08/19/2021. Other: There is minimal ascites in the posterior pelvis. Diffuse mesenteric haziness. This could be congestive or due to malnutrition and/or hepatic dysfunction. There is no free hemorrhage, free air or focal acute inflammatory change. Musculoskeletal: First seen on 08/19/2021, again noted is a subcapital right femoral fracture with the distal fragment increasingly moderately cephalad displaced, multiple heterotopic bone formations in the  area, and moderate fluid in joint space with bony debris. There are increased lucencies in right femoral which could be due to evolution of osteonecrosis of the femoral head or could be on an infectious basis. There are small scattered erosions of right femoral head articular surface but not of the acetabulum. There are mild chronic upper plate anterior wedge compression fractures of the T12, L1 and L3 vertebral bodies, mild degenerative changes of the spine. There are bridging osteophytes of the SI joints. IMPRESSION: 1. Faint patchy hypoenhancement of the right kidney most likely due to pyelonephritis with asymmetric perinephric stranding. No hydronephrosis or urinary stone. 2. Cystitis versus bladder nondistention. 3. Constipation and diverticulosis. 4. Aortic and coronary artery atherosclerosis. 5. Diffuse mesenteric haziness which could be congestive or due to malnutrition and/or hepatic dysfunction. Minimal ascites in the posterior  pelvis. 6. Chronic calcific pancreatitis, only seen in the pancreatic head. No acute inflammatory change. 7. Subcapital right femoral fracture, first seen on CT abdomen and pelvis 08/19/2021, with increasingly moderately superiorly displaced distal fragment, multiple heterotopic bone formations in the area, and moderate fluid in the joint space with bony debris. 8. Increased lucencies in the right femoral head are noted today which could be due to evolution of osteonecrosis or could be on an infectious basis. 9. Mild prostatomegaly with new TURP defect. 10. Chronic mild upper plate anterior wedge compression fractures of the T12, L1 and L3 vertebral bodies. Aortic Atherosclerosis (ICD10-I70.0). Electronically Signed   By: Telford Nab M.D.   On: 03/08/2022 06:28    EKG: not done   Labs on Admission: I have personally reviewed the available labs and imaging studies at the time of the admission.  Pertinent labs:    Glucose 203 Albumin 2.5 Lactate 1.3 WBC 15.1 Hgb  10.5 Platelets 455 UA: 150 glucose, moderate Hgb, large LE, + nitrite, 100 protein; few bacteria, >50 WBC/RBC Blood and urine cultures pending   Assessment and Plan: Principal Problem:   Complicated UTI (urinary tract infection) Active Problems:   DM2 (diabetes mellitus, type 2) (HCC)   HLD (hyperlipidemia)   Pressure injury of skin   Chronic orthostatic hypotension   Physical deconditioning   Progressive neurologic decline    Complicated UTI -Has chronic indwelling Foley catheter -Previously admitted with [facility]TI from [facility], treated with Ertapenem via PICC line -He presented today with bladder distention and pain despite foley -Found to have 600 cc urinary retention -Foley catheter exchanged and patient is having marked symptom relief -UA is abnormal -Mild fever, tachycardia, leukocytosis but no indication of sepsis yet -Urine culture and blood cultures pending -Will need replacement of PICC line once fever has resolved -For now, will treat with meropenem -Hold methenamine for now while actively treating for UTI -Continue oxybutynin  Progressive neurological decline -Patient is a young person with severe neurologic progression that has been thoroughly investigated without apparent cause -No longer ambulatory -PT/OT consults pending -Continue Marinol, tizanidine  DM -Recent A1c 7.7 -Continue glargine -Cover with sensitive-scale SSI    HLD -Continue rosuvastatin, Zetia  Chronic hypotension -Continue midodrine  Pressure ulcer -Identified by nursing -Needs wound care consult   DNR -I have discussed code status with the patient and he would prefer to die a natural death should that situation arise. -She will need a gold out of facility DNR form at the time of discharge    Advance Care Planning:   Code Status: DNR   Consults: PT/OT; nutrition; TOC team  DVT Prophylaxis: Lovenox  Family Communication: None present; he declined to have me call his  sister at the time of admission  Severity of Illness: The appropriate patient status for this patient is OBSERVATION. Observation status is judged to be reasonable and necessary in order to provide the required intensity of service to ensure the patient's safety. The patient's presenting symptoms, physical exam findings, and initial radiographic and laboratory data in the context of their medical condition is felt to place them at decreased risk for further clinical deterioration. Furthermore, it is anticipated that the patient will be medically stable for discharge from the hospital within 2 midnights of admission.   Author: Karmen Bongo, MD 03/08/2022 6:45 PM  For on call review [email or web address].

## 2022-03-08 NOTE — ED Notes (Signed)
Bladder scan shows over of urine.

## 2022-03-08 NOTE — Evaluation (Signed)
Physical Therapy Evaluation Patient Details Name: Charles [name] MRN: [id number] DOB: April 08, 1960 Today's Date: 03/08/2022  History of Present Illness  61 yo male [facility] to [facility] on 12/27 with complicated UTI. PMH significant of insulin-dependent type 2 diabetes, hypotension on midodrine, CAD status post CABG, BPH/ bladder outflow obstruction with chronic indwelling Foley catheter, hyperlipidemia, GERD, cognitive decline/ ?dementia, severe malnutrition  Clinical Impression   Pt presents with debility, impaired knee extension ROM R>L, poor sitting balance, and decreased activity tolerance. Pt to benefit from acute PT to address deficits. Pt requiring mod assist for bed-level mobility and to sit EOB, pt declines any attempt at standing at this time. Unsure of pt true baseline, pt endorsing he was walking as of yesterday at ALF. PT to progress mobility as tolerated, and will continue to follow acutely.         Recommendations for follow up therapy are one component of a multi-disciplinary discharge planning process, led by the attending physician.  Recommendations may be updated based on patient status, additional functional criteria and insurance authorization.  Follow Up Recommendations Skilled nursing-short term rehab (<3 hours/day) Can patient physically be transported by private vehicle: No    Assistance Recommended at Discharge Frequent or constant Supervision/Assistance  Patient can return home with the following  A lot of help with walking and/or transfers;A lot of help with bathing/dressing/bathroom;Assistance with cooking/housework;Direct supervision/assist for medications management;Direct supervision/assist for financial management;Assist for transportation    Equipment Recommendations None recommended by PT  Recommendations for Other Services       Functional Status Assessment Patient has had a recent decline in their functional status and/or demonstrates limited ability to make  significant improvements in function in a reasonable and predictable amount of time     Precautions / Restrictions Precautions Precautions: Fall Restrictions Weight Bearing Restrictions: No      Mobility  Bed Mobility Overal bed mobility: Needs Assistance Bed Mobility: Supine to Sit, Sit to Supine, Rolling Rolling: Min assist   Supine to sit: Mod assist Sit to supine: Mod assist   General bed mobility comments: assist for truncal translation during roll, trunk and LE management during supine<>sit. Increased time, pt with difficulty scooting anteriorly once sitting EOB and requires PT truncal support to maintain upright    Transfers                   General transfer comment: pt declines attempt today, states he "stood yesterday"    Ambulation/Gait                  Stairs            Wheelchair Mobility    Modified Rankin (Stroke Patients Only)       Balance Overall balance assessment: Needs assistance Sitting-balance support: Bilateral upper extremity supported, Feet unsupported Sitting balance-Leahy Scale: Poor Sitting balance - Comments: pt reliant on PT truncal support to maintain upright Postural control: Posterior lean                                   Pertinent Vitals/Pain Pain Assessment Pain Assessment: Faces Faces Pain Scale: Hurts a little bit Pain Location: RLE with knee extension Pain Descriptors / Indicators: Sore Pain Intervention(s): Limited activity within patient's tolerance, Monitored during session, Repositioned    Home Living Family/patient expects to be discharged to:: Assisted [facility])  Prior Function Prior Level of Function : Needs assist;Patient poor historian/Family not available             Mobility Comments: pt reports getting OOB and walking with a RW with therapy, then later states he no longer receives therapy services ADLs Comments: pt  reporting assist for toileting, bathing, and dressing at bed level. He could feed himself independently.     Hand Dominance   Dominant Hand: Right    Extremity/Trunk Assessment   Upper Extremity Assessment Upper Extremity Assessment: Defer to OT evaluation    Lower Extremity Assessment Lower Extremity Assessment: Generalized weakness;Difficult to assess due to impaired cognition;RLE deficits/detail RLE Deficits / Details: lacking approx 25 degrees knee extension with tight hamstrings noted RLE: Unable to fully assess due to pain    Cervical / Trunk Assessment Cervical / Trunk Assessment: Kyphotic;Other exceptions Cervical / Trunk Exceptions: cachectic appearance  Communication   Communication: No difficulties  Cognition Arousal/Alertness: Awake/alert Behavior During Therapy: Restless Overall Cognitive Status: No family/caregiver present to determine baseline cognitive functioning                                 General Comments: pt repeating back PT questions as statements at times, unsure of pt baseline cognitive status. Questionable historian        General Comments      Exercises     Assessment/Plan    PT Assessment Patient needs continued PT services  PT Problem List Decreased strength;Decreased mobility;Decreased activity tolerance;Decreased balance;Decreased range of motion;Decreased cognition;Decreased knowledge of use of DME;Pain       PT Treatment Interventions DME instruction;Therapeutic activities;Gait training;Therapeutic exercise;Patient/family education;Balance training;Functional mobility training;Neuromuscular re-education    PT Goals (Current goals can be found in the Care Plan section)  Acute Rehab PT Goals PT Goal Formulation: With patient Time For Goal Achievement: 03/22/22 Potential to Achieve Goals: Fair    Frequency Min 2X/week     Co-evaluation               AM-PAC PT "6 Clicks" Mobility  Outcome Measure Help  needed turning from your back to your side while in a flat bed without using bedrails?: A Little Help needed moving from lying on your back to sitting on the side of a flat bed without using bedrails?: A Lot Help needed moving to and from a bed to a chair (including a wheelchair)?: Total Help needed standing up from a chair using your arms (e.g., wheelchair or bedside chair)?: Total Help needed to walk in hospital room?: Total Help needed climbing 3-5 steps with a railing? : Total 6 Click Score: 9    End of Session   Activity Tolerance: Patient limited by fatigue Patient left: in bed;with call bell/phone within reach;with bed alarm set Nurse Communication: Mobility status PT Visit Diagnosis: Other abnormalities of gait and mobility (R26.89);Muscle weakness (generalized) (M62.81)    Time: 4431-5400 PT Time Calculation (min) (ACUTE ONLY): 12 min   Charges:   PT Evaluation $PT Eval Low Complexity: 1 Low          Kerrianne Jeng S, PT DPT Acute Rehabilitation Services Pager [phone]  Office [phone]   Magnum Lunde E Christain [place] 03/08/2022, 5:08 PM

## 2022-03-09 DIAGNOSIS — E785 Hyperlipidemia, unspecified: Secondary | ICD-10-CM | POA: Diagnosis present

## 2022-03-09 DIAGNOSIS — S7292XD Unspecified fracture of left femur, subsequent encounter for closed fracture with routine healing: Secondary | ICD-10-CM | POA: Diagnosis not present

## 2022-03-09 DIAGNOSIS — Z794 Long term (current) use of insulin: Secondary | ICD-10-CM | POA: Diagnosis not present

## 2022-03-09 DIAGNOSIS — Z833 Family history of diabetes mellitus: Secondary | ICD-10-CM | POA: Diagnosis not present

## 2022-03-09 DIAGNOSIS — S7292XA Unspecified fracture of left femur, initial encounter for closed fracture: Secondary | ICD-10-CM | POA: Diagnosis not present

## 2022-03-09 DIAGNOSIS — M4854XA Collapsed vertebra, not elsewhere classified, thoracic region, initial encounter for fracture: Secondary | ICD-10-CM | POA: Diagnosis present

## 2022-03-09 DIAGNOSIS — I110 Hypertensive heart disease with heart failure: Secondary | ICD-10-CM | POA: Diagnosis present

## 2022-03-09 DIAGNOSIS — G8929 Other chronic pain: Secondary | ICD-10-CM | POA: Diagnosis present

## 2022-03-09 DIAGNOSIS — E119 Type 2 diabetes mellitus without complications: Secondary | ICD-10-CM | POA: Diagnosis present

## 2022-03-09 DIAGNOSIS — R188 Other ascites: Secondary | ICD-10-CM | POA: Diagnosis present

## 2022-03-09 DIAGNOSIS — T83511A Infection and inflammatory reaction due to indwelling urethral catheter, initial encounter: Secondary | ICD-10-CM | POA: Diagnosis present

## 2022-03-09 DIAGNOSIS — B9620 Unspecified Escherichia coli [E. coli] as the cause of diseases classified elsewhere: Secondary | ICD-10-CM | POA: Diagnosis present

## 2022-03-09 DIAGNOSIS — Z66 Do not resuscitate: Secondary | ICD-10-CM | POA: Diagnosis present

## 2022-03-09 DIAGNOSIS — R339 Retention of urine, unspecified: Secondary | ICD-10-CM | POA: Diagnosis not present

## 2022-03-09 DIAGNOSIS — Z1612 Extended spectrum beta lactamase (ESBL) resistance: Secondary | ICD-10-CM | POA: Diagnosis present

## 2022-03-09 DIAGNOSIS — Z951 Presence of aortocoronary bypass graft: Secondary | ICD-10-CM | POA: Diagnosis not present

## 2022-03-09 DIAGNOSIS — N10 Acute tubulo-interstitial nephritis: Secondary | ICD-10-CM | POA: Diagnosis present

## 2022-03-09 DIAGNOSIS — R627 Adult failure to thrive: Secondary | ICD-10-CM | POA: Diagnosis present

## 2022-03-09 DIAGNOSIS — N390 Urinary tract infection, site not specified: Secondary | ICD-10-CM | POA: Diagnosis present

## 2022-03-09 DIAGNOSIS — N12 Tubulo-interstitial nephritis, not specified as acute or chronic: Secondary | ICD-10-CM | POA: Diagnosis not present

## 2022-03-09 DIAGNOSIS — S7292XS Unspecified fracture of left femur, sequela: Secondary | ICD-10-CM | POA: Diagnosis not present

## 2022-03-09 DIAGNOSIS — E43 Unspecified severe protein-calorie malnutrition: Secondary | ICD-10-CM | POA: Diagnosis present

## 2022-03-09 DIAGNOSIS — M009 Pyogenic arthritis, unspecified: Secondary | ICD-10-CM | POA: Diagnosis not present

## 2022-03-09 DIAGNOSIS — L89152 Pressure ulcer of sacral region, stage 2: Secondary | ICD-10-CM | POA: Diagnosis present

## 2022-03-09 DIAGNOSIS — Z681 Body mass index (BMI) 19 or less, adult: Secondary | ICD-10-CM | POA: Diagnosis not present

## 2022-03-09 DIAGNOSIS — K861 Other chronic pancreatitis: Secondary | ICD-10-CM | POA: Diagnosis present

## 2022-03-09 DIAGNOSIS — I5032 Chronic diastolic (congestive) heart failure: Secondary | ICD-10-CM | POA: Diagnosis present

## 2022-03-09 DIAGNOSIS — Z79899 Other long term (current) drug therapy: Secondary | ICD-10-CM | POA: Diagnosis not present

## 2022-03-09 DIAGNOSIS — K5900 Constipation, unspecified: Secondary | ICD-10-CM | POA: Diagnosis present

## 2022-03-09 DIAGNOSIS — Y846 Urinary catheterization as the cause of abnormal reaction of the patient, or of later complication, without mention of misadventure at the time of the procedure: Secondary | ICD-10-CM | POA: Diagnosis present

## 2022-03-09 DIAGNOSIS — I951 Orthostatic hypotension: Secondary | ICD-10-CM | POA: Diagnosis present

## 2022-03-09 LAB — CBC
HCT: 27.1 % — ABNORMAL LOW (ref 39.0–52.0)
Hemoglobin: 8.7 g/dL — ABNORMAL LOW (ref 13.0–17.0)
MCH: 26.8 pg (ref 26.0–34.0)
MCHC: 32.1 g/dL (ref 30.0–36.0)
MCV: 83.4 fL (ref 80.0–100.0)
Platelets: 354 10*3/uL (ref 150–400)
RBC: 3.25 MIL/uL — ABNORMAL LOW (ref 4.22–5.81)
RDW: 14.8 % (ref 11.5–15.5)
WBC: 28.1 10*3/uL — ABNORMAL HIGH (ref 4.0–10.5)
nRBC: 0 % (ref 0.0–0.2)

## 2022-03-09 LAB — BASIC METABOLIC PANEL
Anion gap: 7 (ref 5–15)
BUN: 11 mg/dL (ref 6–20)
CO2: 27 mmol/L (ref 22–32)
Calcium: 8.4 mg/dL — ABNORMAL LOW (ref 8.9–10.3)
Chloride: 98 mmol/L (ref 98–111)
Creatinine, Ser: 0.84 mg/dL (ref 0.61–1.24)
GFR, Estimated: >60 mL/min (ref >60)
Glucose, Bld: 133 mg/dL — ABNORMAL HIGH (ref 70–99)
Potassium: 4.2 mmol/L (ref 3.5–5.1)
Sodium: 132 mmol/L — ABNORMAL LOW (ref 135–145)

## 2022-03-09 LAB — GLUCOSE, CAPILLARY
Glucose-Capillary: 159 mg/dL — ABNORMAL HIGH (ref 70–99)
Glucose-Capillary: 165 mg/dL — ABNORMAL HIGH (ref 70–99)
Glucose-Capillary: 272 mg/dL — ABNORMAL HIGH (ref 70–99)
Glucose-Capillary: 111 mg/dL — ABNORMAL HIGH (ref 70–99)

## 2022-03-09 MED ORDER — SODIUM CHLORIDE 0.9 % IV SOLN: INTRAVENOUS | Status: AC

## 2022-03-09 MED ORDER — METOPROLOL TARTRATE 5 MG/5ML IV SOLN: 5.0000 mg | INTRAVENOUS | Status: DC | PRN

## 2022-03-09 MED ORDER — GUAIFENESIN 100 MG/5ML PO LIQD: 5.0000 mL | ORAL | Status: DC | PRN

## 2022-03-09 MED ORDER — TRAZODONE HCL 50 MG PO TABS
50.0000 mg | ORAL_TABLET | Freq: Every evening | ORAL | Status: DC | PRN
  Filled 2022-03-09: qty 1

## 2022-03-09 MED ORDER — FOSFOMYCIN TROMETHAMINE 3 G PO PACK
3.0000 g | PACK | Freq: Once | ORAL | Status: DC
  Filled 2022-03-09: qty 3

## 2022-03-09 MED ORDER — HYDRALAZINE HCL 20 MG/ML IJ SOLN: 10.0000 mg | INTRAMUSCULAR | Status: DC | PRN

## 2022-03-09 MED ORDER — IPRATROPIUM-ALBUTEROL 0.5-2.5 (3) MG/3ML IN SOLN: 3.0000 mL | RESPIRATORY_TRACT | Status: DC | PRN

## 2022-03-09 NOTE — Progress Notes (Signed)
Request received for consultation.  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 Prior scan in August 19, 2021, showed chronic nonunion and significant displacement. April 23rd scan did not definitively show a fracture.  Plan for aspiration for infection. If ambulatory and appropriate surgical risk then THA could be considered but of course high risk of complications including infection given ongoing and recurrent resistant bacterial infection. There is no role for repair. Surgical debridement with conversion to girdlestone could be considered, as well.   Myrene Galas, MD [facility], [facility] [phone]

## 2022-03-09 NOTE — Evaluation (Signed)
Occupational Therapy Evaluation Patient Details Name: Charles [name] MRN: [id number] DOB: [birth year] Today's Date: 03/09/2022   History of Present Illness 61 yo male presents to [facility] on 12/27 with complicated UTI. PMH significant of insulin-dependent type 2 diabetes, hypotension on midodrine, CAD status post CABG, BPH/ bladder outflow obstruction with chronic indwelling Foley catheter, hyperlipidemia, GERD, cognitive decline/ ?dementia, severe malnutrition   Clinical Impression   Patient admitted from SNF, where he needed assist for basic transfers and ADL completion.  Deficits impacting independence are listed below, and currently he may be very close to his baseline.  OT will follow in the acute setting to maximize his functional status, and assist with his return to SNF for attempted rehab trial.        Recommendations for follow up therapy are one component of a multi-disciplinary discharge planning process, led by the attending physician.  Recommendations may be updated based on patient status, additional functional criteria and insurance authorization.   Follow Up Recommendations  Skilled nursing-short term rehab (<3 hours/day)     Assistance Recommended at Discharge Frequent or constant Supervision/Assistance  Patient can return home with the following Help with stairs or ramp for entrance;Assist for transportation;A lot of help with walking and/or transfers;A lot of help with bathing/dressing/bathroom    Functional Status Assessment  Patient has had a recent decline in their functional status and demonstrates the ability to make significant improvements in function in a reasonable and predictable amount of time.  Equipment Recommendations  None recommended by OT    Recommendations for Other Services       Precautions / Restrictions Precautions Precautions: Fall      Mobility Bed Mobility Overal bed mobility: Needs Assistance Bed Mobility: Supine to Sit     Supine to  sit: Mod assist          Transfers Overall transfer level: Needs assistance   Transfers: Bed to chair/wheelchair/BSC     Squat pivot transfers: Max assist, Total assist       General transfer comment: near total, unable to push through L leg to assist.      Balance Overall balance assessment: Needs assistance Sitting-balance support: Bilateral upper extremity supported, Feet unsupported Sitting balance-Leahy Scale: Fair Sitting balance - Comments: min guard at times Postural control: Posterior lean                                 ADL either performed or assessed with clinical judgement   ADL       Grooming: Wash/dry hands;Wash/dry face;Set up;Bed level           Upper Body Dressing : Minimal assistance;Bed level   Lower Body Dressing: Moderate assistance;Bed level   Toilet Transfer: Maximal assistance;Squat-pivot                   Vision Patient Visual Report: No change from baseline       Perception     Praxis      Pertinent Vitals/Pain Pain Assessment Pain Assessment: Faces Faces Pain Scale: Hurts a little bit Pain Location: legs Pain Descriptors / Indicators: Aching, Tender Pain Intervention(s): Monitored during session     Hand Dominance Right   Extremity/Trunk Assessment Upper Extremity Assessment Upper Extremity Assessment: Generalized weakness   Lower Extremity Assessment Lower Extremity Assessment: Defer to PT evaluation   Cervical / Trunk Assessment Cervical / Trunk Assessment: Kyphotic   Communication Communication Communication: No difficulties  Cognition Arousal/Alertness: Awake/alert Behavior During Therapy: WFL for tasks assessed/performed Overall Cognitive Status: Within Functional Limits for tasks assessed                                       General Comments   VSS on RA    Exercises     Shoulder Instructions      Home Living Family/patient expects to be discharged to::  Skilled nursing facility                                        Prior Functioning/Environment               Mobility Comments: pt reports getting OOB to wheelchair ADLs Comments: pt reporting assist for toileting, bathing, and dressing at bed level. He could feed himself independently.        OT Problem List: Decreased strength;Decreased activity tolerance;Impaired balance (sitting and/or standing);Pain      OT Treatment/Interventions: Self-care/ADL training;Therapeutic activities;Therapeutic exercise;Patient/family education;Balance training    OT Goals(Current goals can be found in the care plan section) Acute Rehab OT Goals Patient Stated Goal: Be able to do things again OT Goal Formulation: With patient Time For Goal Achievement: 03/23/22 Potential to Achieve Goals: Fair ADL Goals Pt Will Perform Grooming: with set-up;sitting Pt Will Perform Upper Body Dressing: with set-up;sitting Pt Will Perform Lower Body Dressing: with min guard assist;sitting/lateral leans Pt Will Transfer to Toilet: with min assist;squat pivot transfer;bedside commode Pt/caregiver will Perform Home Exercise Program: Increased strength;Both right and left upper extremity;With theraband;With Supervision  OT Frequency: Min 2X/week    Co-evaluation              AM-PAC OT "6 Clicks" Daily Activity     Outcome Measure Help from another person eating meals?: None Help from another person taking care of personal grooming?: A Little Help from another person toileting, which includes using toliet, bedpan, or urinal?: A Lot Help from another person bathing (including washing, rinsing, drying)?: A Lot Help from another person to put on and taking off regular upper body clothing?: A Lot Help from another person to put on and taking off regular lower body clothing?: A Lot 6 Click Score: 15   End of Session Equipment Utilized During Treatment: Gait belt Nurse Communication: Mobility  status  Activity Tolerance: Patient tolerated treatment well Patient left: in chair;with call bell/phone within reach  OT Visit Diagnosis: Unsteadiness on feet (R26.81);Muscle weakness (generalized) (M62.81);Pain Pain - part of body: Leg                Time: 0902-0921 OT Time Calculation (min): 19 min Charges:  OT General Charges $OT Visit: 1 Visit OT Evaluation $OT Eval Moderate Complexity: 1 Mod  03/09/2022  RP, OTR/L  Acute Rehabilitation Services  Office:  [phone]   Suzanna Obey 03/09/2022, 9:31 AM

## 2022-03-09 NOTE — Progress Notes (Signed)
Interventional Radiology Brief Note:  Fluoro-guided right hip joint aspiration requested in patient with chronic right hip fracture with multiple bone fragments.  Reviewed with DG Radiologist, Dr. Nadene Rubins as well as Dr. Archer Asa.  Plan made to proceed with joint/fluid aspiration in CT.  Anticipate procedure 12/29 pending IR schedule.   Ordering service notified.   Loyce Dys, MS RD PA-C

## 2022-03-09 NOTE — Progress Notes (Signed)
Initial Nutrition Assessment  DOCUMENTATION CODES:   Severe malnutrition in context of chronic illness  INTERVENTION:  - Continue Glucerna Shake po TID, each supplement provides 220 kcal and 10 grams of protein  - Add Magic q day, each supplement provides 290 kcal and 9 grams of protein  NUTRITION DIAGNOSIS:   Severe Malnutrition related to chronic illness as evidenced by percent weight loss, severe muscle depletion, severe fat depletion.  GOAL:   Patient will meet greater than or equal to 90% of their needs  MONITOR:   PO intake, Supplement acceptance  REASON FOR ASSESSMENT:   Consult Assessment of nutrition requirement/status  ASSESSMENT:   61 y.o. male admits related to abdominal pain. PMH includes: arthritis, CHF, dementia, DM, HTN. Pt is currently receiving medical management for complicated UTI.  Meds reviewed: colace, marinol, sliding scale insulin, semglee (18 units), mag-ox, MVI, miralax, Thiamine. Labs reviewed: Na low.   The pt reports that he has a decent appetite and has been eating fair. He states that he has been eating mostly 40-60% of his needs. Pt states that he is drinking about 2 Glucerna shakes per day. Per record, pt has experienced a 14% wt loss in 6 months, which is significant. Pt also agreed to trial [employer]. Pt prefers vanilla for supplements. Pt is also receiving scheduled Marinol.   NUTRITION - FOCUSED PHYSICAL EXAM:  Flowsheet Row Most Recent Value  Orbital Region Severe depletion  Upper Arm Region Moderate depletion  Thoracic and Lumbar Region Unable to assess  Buccal Region Severe depletion  Temple Region Severe depletion  Clavicle Bone Region Severe depletion  Clavicle and Acromion Bone Region Moderate depletion  Scapular Bone Region Unable to assess  Dorsal Hand Moderate depletion  Patellar Region Moderate depletion  Anterior Thigh Region Moderate depletion  Posterior Calf Region Moderate depletion  Edema (RD Assessment) None   Hair Reviewed  Eyes Reviewed  Mouth Reviewed  Skin Reviewed  Nails Reviewed       Diet Order:   Diet Order             Diet regular Room service appropriate? Yes; Fluid consistency: Thin  Diet effective now                   EDUCATION NEEDS:   Not appropriate for education at this time  Skin:  Skin Assessment: Skin Integrity Issues: Skin Integrity Issues:: Stage II Stage II: mid sacrum  Last BM:  03/08/22  Height:   Ht Readings from Last 1 Encounters:  03/08/22 5\' 6"  (1.676 m)    Weight:   Wt Readings from Last 1 Encounters:  03/08/22 40.8 kg    Ideal Body Weight:     BMI:  Body mass index is 14.53 kg/m.  Estimated Nutritional Needs:   Kcal:  03/10/22  Protein:  60-70 gm  Fluid:  >/= 1.2 L  [id number], RD, LDN, CNSC.

## 2022-03-09 NOTE — Progress Notes (Signed)
CSW spoke with Irving Burton at [facility] who confirms patient is from the facility as long term care. Irving Burton confirms the patient can return to the facility once medically cleared for discharge.  Edwin Dada, MSW, LCSW Transitions of Care  Clinical Social Worker II [phone]

## 2022-03-09 NOTE — Progress Notes (Signed)
PROGRESS NOTE    Charles [name]  EXN:[id number] DOB: April 24, 1960 DOA: 03/08/2022 PCP: Pcp, No   Brief Narrative:  61 year old with history of progressive neurologic decline, DM 2, chronic urinary retention with indwelling Foley presented to the hospital with abdominal pain.  Patient was also admitted about 6 weeks ago for UTI from Providencia and Enterococcus, treated with ertapenem.  At first she was doing okay but now having symptoms again.  CT scan showed acute pyelonephritis with other multiple abnormal findings.   Assessment & Plan:  Principal Problem:   Complicated UTI (urinary tract infection) Active Problems:   DM2 (diabetes mellitus, type 2) (HCC)   HLD (hyperlipidemia)   Pressure injury of skin   Chronic orthostatic hypotension   Physical deconditioning   Progressive neurologic decline      Complicated UTI, previous history of Providencia, ESBL and Enterococcus. Acute pyelonephritis, right-sided -Patient has chronic indwelling Foley catheter.  This was exchanged in the ER and relieved his urinary retention.  Previous cultures have been reviewed therefore we will continue meropenem, no need for fosfomycin at this time per ID if no evidence of bacteremia, will place PICC line to complete his antibiotic course. ID consulted  Right hip fracture -Appears that this was present on CT scan back in June 2023.  No follow-up or mention of that since.  I discussed case with Dr. Carola Frost from orthopedic who reviewed CAT scan from today and back in June.  At this time given active infection, best approach would be joint aspiration to make sure the space is not infected thereafter will need some sort of joint replacement.  Progressive neurological decline -Patient is a young person with severe neurologic progression that has been thoroughly investigated without apparent cause -No longer ambulatory -PT/OT consults pending -Continue Marinol, tizanidine   DM 2 Recent A1c 7.7.  On  long-acting and sliding scale   HLD -Continue rosuvastatin, Zetia   Chronic hypotension -Continue midodrine   Pressure ulcer, sacral stage II.  PTA -Supportive care  Abnormal CT scan finding - Chronic subcapital right femoral fracture previously seen in June 2023.  Has increasing lucencies of that area concerning for osteonecrosis versus infection, management as mentioned above.  Continue supportive care - Has chronic anterior wedge compression fracture of thoracic and lumbar vertebrae.  Pain control - Constipation.  Bowel regimen - Mesenteric haziness/ascites.  Likely from malnutrition - Chronic calcified pancreatitis.  Supportive care   DNR -I have discussed code status with the patient and he would prefer to die a natural death should that situation arise. -She will need a gold out of facility DNR form at the time of discharge       DVT prophylaxis: Lovenox Code Status: DNR Family Communication:    Ongoing management for ESBL and evaluation for right hip fracture        Body mass index is 14.53 kg/m.  Pressure Injury 03/08/22 Sacrum Mid Stage 2 -  Partial thickness loss of dermis presenting as a shallow open injury with a red, pink wound bed without slough. Pink but not open (Active)  03/08/22 1115  Location: Sacrum  Location Orientation: Mid  Staging: Stage 2 -  Partial thickness loss of dermis presenting as a shallow open injury with a red, pink wound bed without slough.  Wound Description (Comments): Pink but not open  Present on Admission: Yes        Subjective: Seen and examined at bedside.  He mentions that he has been having pain and difficulty of his  bilateral especially right hip for the past several months and deal with that he has had tendency to fall as well. He is also had Foley in place since April and tells me prior to this admission it was changed maybe about 2 months ago.   Examination:  Constitutional: Not in acute distress Respiratory:  Clear to auscultation bilaterally Cardiovascular: Normal sinus rhythm, no rubs Abdomen: Nontender nondistended good bowel sounds Musculoskeletal: No edema noted Skin: No rashes seen Neurologic: CN 2-12 grossly intact.  And nonfocal Psychiatric: Normal judgment and insight. Alert and oriented x 3. Normal mood. Chronic Foley in place, changed this admission Objective: Vitals:   03/08/22 1115 03/08/22 1653 03/08/22 1955 03/09/22 0532  BP: 112/71 108/74 103/64 (!) 92/59  Pulse: (!) 110 (!) 109 100 72  Resp: 18 18 16 18   Temp: (!) 100.4 F (38 C) (!) 100.6 F (38.1 C) 99.9 F (37.7 C) 98.1 F (36.7 C)  TempSrc: Oral Oral Oral Oral  SpO2: 100% 100% 100% 100%  Weight:      Height:        Intake/Output Summary (Last 24 hours) at 03/09/2022 0754 Last data filed at 03/09/2022 03/11/2022 Gross per 24 hour  Intake --  Output 3550 ml  Net -3550 ml   Filed Weights   03/08/22 0247  Weight: 40.8 kg     Data Reviewed:   CBC: Recent Labs  Lab 03/08/22 0301 03/09/22 0243  WBC 15.1* 28.1*  NEUTROABS 12.0*  --   HGB 10.5* 8.7*  HCT 31.8* 27.1*  MCV 83.0 83.4  PLT 455* 354   Basic Metabolic Panel: Recent Labs  Lab 03/08/22 0301 03/09/22 0243  NA 135 132*  K 4.4 4.2  CL 100 98  CO2 22 27  GLUCOSE 203* 133*  BUN 18 11  CREATININE 0.99 0.84  CALCIUM 9.2 8.4*   GFR: Estimated Creatinine Clearance: 54.6 mL/min (by C-G formula based on SCr of 0.84 mg/dL). Liver Function Tests: Recent Labs  Lab 03/08/22 0301  AST 24  ALT 17  ALKPHOS 90  BILITOT 0.5  PROT 7.6  ALBUMIN 2.5*   Recent Labs  Lab 03/08/22 0301  LIPASE 29   No results for input(s): "AMMONIA" in the last 168 hours. Coagulation Profile: No results for input(s): "INR", "PROTIME" in the last 168 hours. Cardiac Enzymes: No results for input(s): "CKTOTAL", "CKMB", "CKMBINDEX", "TROPONINI" in the last 168 hours. BNP (last 3 results) No results for input(s): "PROBNP" in the last 8760 hours. HbA1C: No results  for input(s): "HGBA1C" in the last 72 hours. CBG: Recent Labs  Lab 03/08/22 1208 03/08/22 1800  GLUCAP 160* 248*   Lipid Profile: No results for input(s): "CHOL", "HDL", "LDLCALC", "TRIG", "CHOLHDL", "LDLDIRECT" in the last 72 hours. Thyroid Function Tests: No results for input(s): "TSH", "T4TOTAL", "FREET4", "T3FREE", "THYROIDAB" in the last 72 hours. Anemia Panel: No results for input(s): "VITAMINB12", "FOLATE", "FERRITIN", "TIBC", "IRON", "RETICCTPCT" in the last 72 hours. Sepsis Labs: Recent Labs  Lab 03/08/22 0301  LATICACIDVEN 1.3    Recent Results (from the past 240 hour(s))  Culture, blood (routine x 2)     Status: None (Preliminary result)   Collection Time: 03/08/22  6:18 AM   Specimen: BLOOD LEFT ARM  Result Value Ref Range Status   Specimen Description BLOOD LEFT ARM  Final   Special Requests   Final    BOTTLES DRAWN AEROBIC AND ANAEROBIC Blood Culture results may not be optimal due to an inadequate volume of blood received in culture bottles  Culture   Final    NO GROWTH 1 DAY Performed at [facility], [address] [place], [place] [address]    Report Status PENDING  Incomplete  Culture, blood (routine x 2)     Status: None (Preliminary result)   Collection Time: 03/08/22  6:18 AM   Specimen: BLOOD RIGHT ARM  Result Value Ref Range Status   Specimen Description BLOOD RIGHT ARM  Final   Special Requests   Final    BOTTLES DRAWN AEROBIC AND ANAEROBIC Blood Culture results may not be optimal due to an inadequate volume of blood received in culture bottles   Culture   Final    NO GROWTH 1 DAY Performed at [facility], [address] [place], [place] [address]    Report Status PENDING  Incomplete  MRSA Next Gen by PCR, Nasal     Status: Abnormal   Collection Time: 03/08/22  5:58 PM   Specimen: Nasal Mucosa; Nasal Swab  Result Value Ref Range Status   MRSA by PCR Next Gen DETECTED (A) NOT DETECTED Final    Comment: RESULT CALLED TO, READ  BACK BY AND VERIFIED WITH: RN Catalina PizzaP. MARTIN [phone] @[phone]  FH (NOTE) The GeneXpert MRSA Assay (FDA approved for NASAL specimens only), is one component of a comprehensive MRSA colonization surveillance program. It is not intended to diagnose MRSA infection nor to guide or monitor treatment for MRSA infections. Test performance is not FDA approved in patients less than 59 years old. Performed at [facility], [address] [place], [place] [address]          Radiology Studies: CT ABDOMEN PELVIS W CONTRAST  Result Date: 03/08/2022 CLINICAL DATA:  Abdominal pain. EXAM: CT ABDOMEN AND PELVIS WITH CONTRAST TECHNIQUE: Multidetector CT imaging of the abdomen and pelvis was performed using the standard protocol following bolus administration of intravenous contrast. RADIATION DOSE REDUCTION: This exam was performed according to the departmental dose-optimization program which includes automated exposure control, adjustment of the mA and/or kV according to patient size and/or use of iterative reconstruction technique. CONTRAST:  65mL OMNIPAQUE IOHEXOL 350 MG/ML SOLN COMPARISON:  CT without contrast 08/19/2021, CT with IV contrast 07/03/2021 FINDINGS: Lower chest: The cardiac size is normal. There are three-vessel coronary artery calcifications. No pericardial effusion. Lung bases are clear. Hepatobiliary: No focal liver abnormality is seen. No calcified gallstones, gallbladder wall thickening, or biliary dilatation. Pancreas: There are calcifications in the pancreatic head consistent with chronic calcific pancreatitis. There is no evidence of acute inflammatory change, mass enhancement or ductal dilatation. Spleen: Normal. Adrenals/Urinary Tract: There is no adrenal mass. There is faint patchy hypoenhancement right kidney most likely due to pyelonephritis with asymmetric perinephric stranding. There is no urinary stone or hydroureteronephrosis. No renal mass enhancement. There is a 5 mm hypodensity in  the inferior pole of the left kidney which is too small to characterize but most likely representing a cyst. No follow-up imaging is recommended. No findings of left-sided pyelonephritis. The bladder is catheterized but again appears diffusely thickened, which was seen previously. Stomach/Bowel: There is fold thickening of the proximal to mid stomach and in some of the left abdominal small bowel. There is no small bowel dilatation or inflammation. The appendix is normal. There is moderate diffuse retained stool. Scattered diverticula noted without evidence of acute diverticulitis. Vascular/Lymphatic: Aortic atherosclerosis. No enlarged abdominal or pelvic lymph nodes. Reproductive: Mild prostatomegaly. TURP defect appears new from 08/19/2021. Other: There is minimal ascites in the posterior  pelvis. Diffuse mesenteric haziness. This could be congestive or due to malnutrition and/or hepatic dysfunction. There is no free hemorrhage, free air or focal acute inflammatory change. Musculoskeletal: First seen on 08/19/2021, again noted is a subcapital right femoral fracture with the distal fragment increasingly moderately cephalad displaced, multiple heterotopic bone formations in the area, and moderate fluid in joint space with bony debris. There are increased lucencies in right femoral which could be due to evolution of osteonecrosis of the femoral head or could be on an infectious basis. There are small scattered erosions of right femoral head articular surface but not of the acetabulum. There are mild chronic upper plate anterior wedge compression fractures of the T12, L1 and L3 vertebral bodies, mild degenerative changes of the spine. There are bridging osteophytes of the SI joints. IMPRESSION: 1. Faint patchy hypoenhancement of the right kidney most likely due to pyelonephritis with asymmetric perinephric stranding. No hydronephrosis or urinary stone. 2. Cystitis versus bladder nondistention. 3. Constipation and  diverticulosis. 4. Aortic and coronary artery atherosclerosis. 5. Diffuse mesenteric haziness which could be congestive or due to malnutrition and/or hepatic dysfunction. Minimal ascites in the posterior pelvis. 6. Chronic calcific pancreatitis, only seen in the pancreatic head. No acute inflammatory change. 7. Subcapital right femoral fracture, first seen on CT abdomen and pelvis 08/19/2021, with increasingly moderately superiorly displaced distal fragment, multiple heterotopic bone formations in the area, and moderate fluid in the joint space with bony debris. 8. Increased lucencies in the right femoral head are noted today which could be due to evolution of osteonecrosis or could be on an infectious basis. 9. Mild prostatomegaly with new TURP defect. 10. Chronic mild upper plate anterior wedge compression fractures of the T12, L1 and L3 vertebral bodies. Aortic Atherosclerosis (ICD10-I70.0). Electronically Signed   By: Almira Bar M.D.   On: 03/08/2022 06:28        Scheduled Meds:  aspirin EC  81 mg Oral Daily   docusate sodium  100 mg Oral BID   dronabinol  5 mg Oral BID AC   enoxaparin (LOVENOX) injection  20 mg Subcutaneous Q24H   ezetimibe  10 mg Oral Daily   feeding supplement (GLUCERNA SHAKE)  237 mL Oral TID WC   insulin aspart  0-9 Units Subcutaneous TID WC   insulin glargine-yfgn  18 Units Subcutaneous Daily   magnesium oxide  400 mg Oral Daily   midodrine  10 mg Oral TID WC   multivitamin with minerals  1 tablet Oral Daily   oxybutynin  5 mg Oral BID   pantoprazole  40 mg Oral QAC breakfast   polyethylene glycol  17 g Oral Daily   rosuvastatin  20 mg Oral Daily   thiamine  100 mg Oral Daily   Continuous Infusions:  meropenem (MERREM) IV 1 g (03/09/22 0210)     LOS: 0 days   Time spent= 35 mins    Gisell Buehrle Joline Maxcy, MD [facility]  If 7PM-7AM, please contact night-coverage  03/09/2022, 7:54 AM

## 2022-03-09 NOTE — Consult Note (Signed)
Reason for Consult:Right hip fx Referring Physician: Stephania Fragmin Time called: 1216 Time at bedside: 1313   Charles [name] is an 61 y.o. male.  HPI: Charles [name] was admitted this morning with UTI. As part of his workup he underwent CT abd/pelvic which showed a chronic right subcapital fx with joint effusion and femoral head lucencies and orthopedic surgery was consulted. He denies knowing about or when he broke his hip though he does admit to multiple falls over the past year or so. He c/o chronic pain in that hip, mostly when he sits. He has been ambulatory though is in a nursing home due to debility.   Past Medical History:  Diagnosis Date   Arthritis    Chronic diastolic heart failure (HCC)    Dementia (HCC)    Diabetes mellitus without complication (HCC)    Heart disease    History of recurrent UTIs    (has chronic indwelling foley catheter)   Hypertension     Past Surgical History:  Procedure Laterality Date   COLONOSCOPY WITH PROPOFOL N/A 08/21/2021   Procedure: COLONOSCOPY WITH PROPOFOL;  Surgeon: Jenel Lucks, MD;  Location: [facility] ENDOSCOPY;  Service: Gastroenterology;  Laterality: N/A;   CORONARY ARTERY BYPASS GRAFT     ESOPHAGOGASTRODUODENOSCOPY (EGD) WITH PROPOFOL N/A 08/21/2021   Procedure: ESOPHAGOGASTRODUODENOSCOPY (EGD) WITH PROPOFOL;  Surgeon: Jenel Lucks, MD;  Location: [facility] ENDOSCOPY;  Service: Gastroenterology;  Laterality: N/A;   POLYPECTOMY  08/21/2021   Procedure: POLYPECTOMY;  Surgeon: Jenel Lucks, MD;  Location: [facility] ENDOSCOPY;  Service: Gastroenterology;;    Family History  Problem Relation Age of Onset   Diabetes Mother    Hypertension Mother    Diabetes Father    Hypertension Father    Heart attack Father    Diabetes Sister    Diabetes Sister     Social History:  reports that he has never smoked. He has never used smokeless tobacco. He reports that he does not currently use alcohol. He reports that he does not use drugs.  Allergies: No Known  Allergies  Medications: I have reviewed the patient's current medications.  Results for orders placed or performed during the hospital encounter of 03/08/22 (from the past 48 hour(s))  Urine Culture     Status: Abnormal (Preliminary result)   Collection Time: 03/08/22  2:56 AM   Specimen: Urine, Catheterized  Result Value Ref Range   Specimen Description URINE, CATHETERIZED    Special Requests      NONE Performed at [facility], [address], [place], [place] [address]    Culture >=100,000 COLONIES/mL ESCHERICHIA COLI (A)    Report Status PENDING   Comprehensive metabolic panel     Status: Abnormal   Collection Time: 03/08/22  3:01 AM  Result Value Ref Range   Sodium 135 135 - 145 mmol/L   Potassium 4.4 3.5 - 5.1 mmol/L   Chloride 100 98 - 111 mmol/L   CO2 22 22 - 32 mmol/L   Glucose, Bld 203 (H) 70 - 99 mg/dL    Comment: Glucose reference range applies only to samples taken after fasting for at least 8 hours.   BUN 18 6 - 20 mg/dL   Creatinine, Ser 8.10 0.61 - 1.24 mg/dL   Calcium 9.2 8.9 - 17.5 mg/dL   Total Protein 7.6 6.5 - 8.1 g/dL   Albumin 2.5 (L) 3.5 - 5.0 g/dL   AST 24 15 - 41 U/L   ALT 17 0 - 44 U/L  Alkaline Phosphatase 90 38 - 126 U/L   Total Bilirubin 0.5 0.3 - 1.2 mg/dL   GFR, Estimated >60 >60 mL/min    Comment: (NOTE) Calculated using the CKD-EPI Creatinine Equation (2021)    Anion gap 13 5 - 15    Comment: Performed at [facility] [address], [place], [place] [address]  Lactic acid, plasma     Status: None   Collection Time: 03/08/22  3:01 AM  Result Value Ref Range   Lactic Acid, Venous 1.3 0.5 - 1.9 mmol/L    Comment: Performed at [facility] [address] [place], [place] [address]  Lipase, blood     Status: None   Collection Time: 03/08/22  3:01 AM  Result Value Ref Range   Lipase 29 11 - 51 U/L    Comment: Performed at [facility] [address] [place], [place] [address]  CBC with Differential      Status: Abnormal   Collection Time: 03/08/22  3:01 AM  Result Value Ref Range   WBC 15.1 (H) 4.0 - 10.5 K/uL   RBC 3.83 (L) 4.22 - 5.81 MIL/uL   Hemoglobin 10.5 (L) 13.0 - 17.0 g/dL   HCT 31.8 (L) 39.0 - 52.0 %   MCV 83.0 80.0 - 100.0 fL   MCH 27.4 26.0 - 34.0 pg   MCHC 33.0 30.0 - 36.0 g/dL   RDW 14.9 11.5 - 15.5 %   Platelets 455 (H) 150 - 400 K/uL   nRBC 0.0 0.0 - 0.2 %   Neutrophils Relative % 80 %   Neutro Abs 12.0 (H) 1.7 - 7.7 K/uL   Lymphocytes Relative 11 %   Lymphs Abs 1.7 0.7 - 4.0 K/uL   Monocytes Relative 7 %   Monocytes Absolute 1.1 (H) 0.1 - 1.0 K/uL   Eosinophils Relative 2 %   Eosinophils Absolute 0.3 0.0 - 0.5 K/uL   Basophils Relative 0 %   Basophils Absolute 0.1 0.0 - 0.1 K/uL   Immature Granulocytes 0 %   Abs Immature Granulocytes 0.06 0.00 - 0.07 K/uL    Comment: Performed at [facility], [place] [address], [place], [place] [address]  Urinalysis, Routine w reflex microscopic Urine, Catheterized     Status: Abnormal   Collection Time: 03/08/22  4:01 AM  Result Value Ref Range   Color, Urine AMBER (A) YELLOW    Comment: BIOCHEMICALS MAY BE AFFECTED BY COLOR   APPearance TURBID (A) CLEAR   Specific Gravity, Urine 1.015 1.005 - 1.030   pH 5.0 5.0 - 8.0   Glucose, UA 150 (A) NEGATIVE mg/dL   Hgb urine dipstick MODERATE (A) NEGATIVE   Bilirubin Urine NEGATIVE NEGATIVE   Ketones, ur NEGATIVE NEGATIVE mg/dL   Protein, ur 100 (A) NEGATIVE mg/dL   Nitrite POSITIVE (A) NEGATIVE   Leukocytes,Ua LARGE (A) NEGATIVE   RBC / HPF >50 (H) 0 - 5 RBC/hpf   WBC, UA >50 (H) 0 - 5 WBC/hpf   Bacteria, UA FEW (A) NONE SEEN   Squamous Epithelial / LPF 0-5 0 - 5 /HPF   Mucus PRESENT     Comment: Performed at [facility], [address], [place], [place] [address]  Culture, blood (routine x 2)     Status: None (Preliminary result)   Collection Time: 03/08/22  6:18 AM   Specimen: BLOOD LEFT ARM  Result Value Ref Range   Specimen Description BLOOD LEFT ARM     Special  Requests      BOTTLES DRAWN AEROBIC AND ANAEROBIC Blood Culture results may not be optimal due to an inadequate volume of blood received in culture bottles   Culture      NO GROWTH 1 DAY Performed at [facility] [address], [place], [place] [address]    Report Status PENDING   Culture, blood (routine x 2)     Status: None (Preliminary result)   Collection Time: 03/08/22  6:18 AM   Specimen: BLOOD RIGHT ARM  Result Value Ref Range   Specimen Description BLOOD RIGHT ARM    Special Requests      BOTTLES DRAWN AEROBIC AND ANAEROBIC Blood Culture results may not be optimal due to an inadequate volume of blood received in culture bottles   Culture      NO GROWTH 1 DAY Performed at [facility], [place] [address] [place],  [address]    Report Status PENDING   Glucose, capillary     Status: Abnormal   Collection Time: 03/08/22 12:08 PM  Result Value Ref Range   Glucose-Capillary 160 (H) 70 - 99 mg/dL    Comment: Glucose reference range applies only to samples taken after fasting for at least 8 hours.  MRSA Next Gen by PCR, Nasal     Status: Abnormal   Collection Time: 03/08/22  5:58 PM   Specimen: Nasal Mucosa; Nasal Swab  Result Value Ref Range   MRSA by PCR Next Gen DETECTED (A) NOT DETECTED    Comment: RESULT CALLED TO, READ BACK BY AND VERIFIED WITH: RN Vilma Prader [phone] @[phone]  FH (NOTE) The GeneXpert MRSA Assay (FDA approved for NASAL specimens only), is one component of a comprehensive MRSA colonization surveillance program. It is not intended to diagnose MRSA infection nor to guide or monitor treatment for MRSA infections. Test performance is not FDA approved in patients less than 41 years old. Performed at [facility], [place] [address], [place], [place] [address]   Glucose, capillary     Status: Abnormal   Collection Time: 03/08/22  6:00 PM  Result Value Ref Range   Glucose-Capillary 248 (H) 70 - 99 mg/dL    Comment: Glucose reference  range applies only to samples taken after fasting for at least 8 hours.  Basic metabolic panel     Status: Abnormal   Collection Time: 03/09/22  2:43 AM  Result Value Ref Range   Sodium 132 (L) 135 - 145 mmol/L   Potassium 4.2 3.5 - 5.1 mmol/L   Chloride 98 98 - 111 mmol/L   CO2 27 22 - 32 mmol/L   Glucose, Bld 133 (H) 70 - 99 mg/dL    Comment: Glucose reference range applies only to samples taken after fasting for at least 8 hours.   BUN 11 6 - 20 mg/dL   Creatinine, Ser 0.84 0.61 - 1.24 mg/dL   Calcium 8.4 (L) 8.9 - 10.3 mg/dL   GFR, Estimated >60 >60 mL/min    Comment: (NOTE) Calculated using the CKD-EPI Creatinine Equation (2021)    Anion gap 7 5 - 15    Comment: Performed at [facility] [address] [place] [address]  CBC     Status: Abnormal   Collection Time: 03/09/22  2:43 AM  Result Value Ref Range   WBC 28.1 (H) 4.0 - 10.5 K/uL   RBC 3.25 (L) 4.22 - 5.81 MIL/uL   Hemoglobin 8.7 (L) 13.0 - 17.0 g/dL  HCT 27.1 (L) 39.0 - 52.0 %   MCV 83.4 80.0 - 100.0 fL   MCH 26.8 26.0 - 34.0 pg   MCHC 32.1 30.0 - 36.0 g/dL   RDW 14.8 11.5 - 15.5 %   Platelets 354 150 - 400 K/uL   nRBC 0.0 0.0 - 0.2 %    Comment: Performed at [facility], [place] [address], [place], [place] [address]  Glucose, capillary     Status: Abnormal   Collection Time: 03/09/22  8:50 AM  Result Value Ref Range   Glucose-Capillary 159 (H) 70 - 99 mg/dL    Comment: Glucose reference range applies only to samples taken after fasting for at least 8 hours.  Glucose, capillary     Status: Abnormal   Collection Time: 03/09/22 11:46 AM  Result Value Ref Range   Glucose-Capillary 165 (H) 70 - 99 mg/dL    Comment: Glucose reference range applies only to samples taken after fasting for at least 8 hours.    CT ABDOMEN PELVIS W CONTRAST  Result Date: 03/08/2022 CLINICAL DATA:  Abdominal pain. EXAM: CT ABDOMEN AND PELVIS WITH CONTRAST TECHNIQUE: Multidetector CT imaging of the abdomen and  pelvis was performed using the standard protocol following bolus administration of intravenous contrast. RADIATION DOSE REDUCTION: This exam was performed according to the departmental dose-optimization program which includes automated exposure control, adjustment of the mA and/or kV according to patient size and/or use of iterative reconstruction technique. CONTRAST:  61mL OMNIPAQUE IOHEXOL 350 MG/ML SOLN COMPARISON:  CT without contrast 08/19/2021, CT with IV contrast 07/03/2021 FINDINGS: Lower chest: The cardiac size is normal. There are three-vessel coronary artery calcifications. No pericardial effusion. Lung bases are clear. Hepatobiliary: No focal liver abnormality is seen. No calcified gallstones, gallbladder wall thickening, or biliary dilatation. Pancreas: There are calcifications in the pancreatic head consistent with chronic calcific pancreatitis. There is no evidence of acute inflammatory change, mass enhancement or ductal dilatation. Spleen: Normal. Adrenals/Urinary Tract: There is no adrenal mass. There is faint patchy hypoenhancement right kidney most likely due to pyelonephritis with asymmetric perinephric stranding. There is no urinary stone or hydroureteronephrosis. No renal mass enhancement. There is a 5 mm hypodensity in the inferior pole of the left kidney which is too small to characterize but most likely representing a cyst. No follow-up imaging is recommended. No findings of left-sided pyelonephritis. The bladder is catheterized but again appears diffusely thickened, which was seen previously. Stomach/Bowel: There is fold thickening of the proximal to mid stomach and in some of the left abdominal small bowel. There is no small bowel dilatation or inflammation. The appendix is normal. There is moderate diffuse retained stool. Scattered diverticula noted without evidence of acute diverticulitis. Vascular/Lymphatic: Aortic atherosclerosis. No enlarged abdominal or pelvic lymph nodes.  Reproductive: Mild prostatomegaly. TURP defect appears new from 08/19/2021. Other: There is minimal ascites in the posterior pelvis. Diffuse mesenteric haziness. This could be congestive or due to malnutrition and/or hepatic dysfunction. There is no free hemorrhage, free air or focal acute inflammatory change. Musculoskeletal: First seen on 08/19/2021, again noted is a subcapital right femoral fracture with the distal fragment increasingly moderately cephalad displaced, multiple heterotopic bone formations in the area, and moderate fluid in joint space with bony debris. There are increased lucencies in right femoral which could be due to evolution of osteonecrosis of the femoral head or could be on an infectious basis. There are small scattered erosions of right femoral head articular surface but not of the acetabulum. There are  mild chronic upper plate anterior wedge compression fractures of the T12, L1 and L3 vertebral bodies, mild degenerative changes of the spine. There are bridging osteophytes of the SI joints. IMPRESSION: 1. Faint patchy hypoenhancement of the right kidney most likely due to pyelonephritis with asymmetric perinephric stranding. No hydronephrosis or urinary stone. 2. Cystitis versus bladder nondistention. 3. Constipation and diverticulosis. 4. Aortic and coronary artery atherosclerosis. 5. Diffuse mesenteric haziness which could be congestive or due to malnutrition and/or hepatic dysfunction. Minimal ascites in the posterior pelvis. 6. Chronic calcific pancreatitis, only seen in the pancreatic head. No acute inflammatory change. 7. Subcapital right femoral fracture, first seen on CT abdomen and pelvis 08/19/2021, with increasingly moderately superiorly displaced distal fragment, multiple heterotopic bone formations in the area, and moderate fluid in the joint space with bony debris. 8. Increased lucencies in the right femoral head are noted today which could be due to evolution of osteonecrosis  or could be on an infectious basis. 9. Mild prostatomegaly with new TURP defect. 10. Chronic mild upper plate anterior wedge compression fractures of the T12, L1 and L3 vertebral bodies. Aortic Atherosclerosis (ICD10-I70.0). Electronically Signed   By: Telford Nab M.D.   On: 03/08/2022 06:28    Review of Systems  Constitutional:  Negative for chills, diaphoresis and fever.  HENT:  Negative for ear discharge, ear pain, hearing loss and tinnitus.   Eyes:  Negative for photophobia and pain.  Respiratory:  Negative for cough and shortness of breath.   Cardiovascular:  Negative for chest pain.  Gastrointestinal:  Negative for abdominal pain, nausea and vomiting.  Genitourinary:  Negative for dysuria, flank pain, frequency and urgency.  Musculoskeletal:  Positive for arthralgias (Right hip, chronic). Negative for back pain, myalgias and neck pain.  Neurological:  Negative for dizziness and headaches.  Hematological:  Does not bruise/bleed easily.  Psychiatric/Behavioral:  The patient is not nervous/anxious.    Blood pressure (!) 92/59, pulse 72, temperature 98.1 F (36.7 C), temperature source Oral, resp. rate 18, height 5\' 6"  (1.676 m), weight 40.8 kg, SpO2 100 %. Physical Exam Constitutional:      General: He is not in acute distress.    Appearance: He is well-developed. He is not diaphoretic.  HENT:     Head: Normocephalic and atraumatic.  Eyes:     General: No scleral icterus.       Right eye: No discharge.        Left eye: No discharge.     Conjunctiva/sclera: Conjunctivae normal.  Cardiovascular:     Rate and Rhythm: Normal rate and regular rhythm.  Pulmonary:     Effort: Pulmonary effort is normal. No respiratory distress.  Musculoskeletal:     Cervical back: Normal range of motion.     Comments: RLE No traumatic wounds, ecchymosis, or rash  Nontender, mild pain with AROM hip, no pain with PROM hip, leg w/severe atrophy  No knee or ankle effusion  Knee stable to varus/ valgus  and anterior/posterior stress  Sens DPN, SPN, TN intact  Motor EHL, ext, flex, evers 5/5  DP 2+, PT 2+, No significant edema  Skin:    General: Skin is warm and dry.  Neurological:     Mental Status: He is alert.  Psychiatric:        Mood and Affect: Mood normal.        Behavior: Behavior normal.     Assessment/Plan: Right hip effusion -- Exam not c/w septic joint but will have IR tap and send  for cell count and culture. Will likely need referral to joint surgeon for evaluation of next steps.    Lisette Abu, PA-C Orthopedic Surgery [phone] 03/09/2022, 1:29 PM

## 2022-03-10 ENCOUNTER — Inpatient Hospital Stay (HOSPITAL_COMMUNITY): Payer: Commercial Managed Care - HMO

## 2022-03-10 DIAGNOSIS — N12 Tubulo-interstitial nephritis, not specified as acute or chronic: Secondary | ICD-10-CM

## 2022-03-10 DIAGNOSIS — R339 Retention of urine, unspecified: Secondary | ICD-10-CM | POA: Diagnosis not present

## 2022-03-10 DIAGNOSIS — Z1612 Extended spectrum beta lactamase (ESBL) resistance: Secondary | ICD-10-CM

## 2022-03-10 DIAGNOSIS — S7292XA Unspecified fracture of left femur, initial encounter for closed fracture: Secondary | ICD-10-CM | POA: Diagnosis not present

## 2022-03-10 DIAGNOSIS — B9629 Other Escherichia coli [E. coli] as the cause of diseases classified elsewhere: Secondary | ICD-10-CM

## 2022-03-10 DIAGNOSIS — N390 Urinary tract infection, site not specified: Secondary | ICD-10-CM | POA: Diagnosis not present

## 2022-03-10 LAB — BASIC METABOLIC PANEL
Anion gap: 4 — ABNORMAL LOW (ref 5–15)
BUN: 11 mg/dL (ref 6–20)
CO2: 27 mmol/L (ref 22–32)
Calcium: 8.6 mg/dL — ABNORMAL LOW (ref 8.9–10.3)
Chloride: 104 mmol/L (ref 98–111)
Creatinine, Ser: 0.62 mg/dL (ref 0.61–1.24)
GFR, Estimated: >60 mL/min (ref >60)
Glucose, Bld: 90 mg/dL (ref 70–99)
Potassium: 3.9 mmol/L (ref 3.5–5.1)
Sodium: 135 mmol/L (ref 135–145)

## 2022-03-10 LAB — MAGNESIUM: Magnesium: 1.6 mg/dL — ABNORMAL LOW (ref 1.7–2.4)

## 2022-03-10 LAB — URINE CULTURE: Culture: >=100000 — AB

## 2022-03-10 LAB — GLUCOSE, CAPILLARY
Glucose-Capillary: 104 mg/dL — ABNORMAL HIGH (ref 70–99)
Glucose-Capillary: 148 mg/dL — ABNORMAL HIGH (ref 70–99)
Glucose-Capillary: 179 mg/dL — ABNORMAL HIGH (ref 70–99)
Glucose-Capillary: 199 mg/dL — ABNORMAL HIGH (ref 70–99)

## 2022-03-10 LAB — CBC
HCT: 30.3 % — ABNORMAL LOW (ref 39.0–52.0)
Hemoglobin: 9.7 g/dL — ABNORMAL LOW (ref 13.0–17.0)
MCH: 26.8 pg (ref 26.0–34.0)
MCHC: 32 g/dL (ref 30.0–36.0)
MCV: 83.7 fL (ref 80.0–100.0)
Platelets: 347 10*3/uL (ref 150–400)
RBC: 3.62 MIL/uL — ABNORMAL LOW (ref 4.22–5.81)
RDW: 14.6 % (ref 11.5–15.5)
WBC: 19 10*3/uL — ABNORMAL HIGH (ref 4.0–10.5)
nRBC: 0 % (ref 0.0–0.2)

## 2022-03-10 MED ORDER — SODIUM CHLORIDE 0.9 % IV SOLN
1.0000 g | Freq: Three times a day (TID) | INTRAVENOUS | Status: AC
  Administered 2022-03-10 – 2022-03-15 (×14): 1 g via INTRAVENOUS
  Filled 2022-03-10 (×15): qty 20

## 2022-03-10 MED ORDER — MUPIROCIN 2 % EX OINT
1.0000 | TOPICAL_OINTMENT | Freq: Two times a day (BID) | CUTANEOUS | Status: AC
  Administered 2022-03-10 – 2022-03-14 (×9): 1 via NASAL
  Filled 2022-03-10 (×3): qty 22

## 2022-03-10 MED ORDER — CHLORHEXIDINE GLUCONATE CLOTH 2 % EX PADS
6.0000 | MEDICATED_PAD | Freq: Every day | CUTANEOUS | Status: AC
  Administered 2022-03-10 – 2022-03-14 (×5): 6 via TOPICAL

## 2022-03-10 MED ORDER — MAGNESIUM SULFATE 2 GM/50ML IV SOLN
2.0000 g | Freq: Once | INTRAVENOUS | Status: AC
  Administered 2022-03-10: 2 g via INTRAVENOUS
  Filled 2022-03-10: qty 50

## 2022-03-10 MED ORDER — GADOBUTROL 1 MMOL/ML IV SOLN
4.0000 mL | Freq: Once | INTRAVENOUS | Status: AC | PRN
  Administered 2022-03-10: 4 mL via INTRAVENOUS

## 2022-03-10 NOTE — Progress Notes (Signed)
Interventional Radiology Brief Note:  Pt s/p small volume hip aspiration this AM. PA notified by cytology sample from hip aspiration has been sent for gram stain, culture, and cell count. Asking for direction for remaining sample in light of several remaining lab orders. Have reached out to [facility] and ortho for prioritization if needed.   Loyce Dys, MS RD PA-C

## 2022-03-10 NOTE — Progress Notes (Signed)
Pharmacy Antibiotic Note  Charles [name] is a 61 y.o. male admitted on 03/08/2022 with UTI . Pharmacy has been consulted for Meropenem dosing.  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 Cefepime 1gm IV given in ED on 12/27, then changed to Meropenem. Chronic indwelling foley, exchanged 12/27  Creatinine 0.99 on admit, up from baseline, now back to baseline 0.5-0.7. ESBL E coli in urine culture. S/p R hip aspiration today.  Plan: Increase Meropenem 1gm IV q12 > q8hrs. Follow renal function, culture data, clinical progress and antibiotic plans. ID expecting 10-day course.  Height: 5\' 6"  (167.6 cm) Weight: 40.8 kg (90 lb) IBW/kg (Calculated) : 63.8  Temp (24hrs), Avg:97.9 F (36.6 C), Min:97.7 F (36.5 C), Max:98.1 F (36.7 C)  Recent Labs  Lab 03/08/22 0301 03/09/22 0243 03/10/22 0415  WBC 15.1* 28.1* 19.0*  CREATININE 0.99 0.84 0.62  LATICACIDVEN 1.3  --   --      Estimated Creatinine Clearance: 57.4 mL/min (by C-G formula based on SCr of 0.62 mg/dL).    No Known Allergies  Antimicrobials this admission: Cefepime x 1 on 12/27 Meropenem 12/27 >> Mupirocin/CHG 12/29>>(1/2)  Dose adjustments this admission: 12/29: Meropenem q12 > q8h for improved renal funcion  Microbiology results: 12/27 blood: no growth x 2 days to date 12/27 urine: >100K/ml E coli - ESBL, sensitive to Imipenem 12/27 MRSA PCR: detected 12/29 R hip aspiraton: no organisims on gram stain, pending  Thank you for allowing pharmacy to be a part of this patient's care.  1/30, RPh 03/10/2022 3:30 PM

## 2022-03-10 NOTE — Progress Notes (Signed)
[facility] 2W30 [facility]) Hospital Liaison Note  [facility] has received a referral from [facility] for this patient to return to the facility with hospice services.  Hospital Liaison will continue to follow for discharge disposition.  Please call with any hospice questions, concerns or questions.  Thank you. Haynes Bast, BSN, RN [facility] Liaison [phone]

## 2022-03-10 NOTE — Procedures (Signed)
Interventional Radiology Procedure Note  Procedure: CT RT HIP ASP    Complications: None  Estimated Blood Loss:  0  Findings: 2CC CLEAR SYNOVIAL FLD CX SENT    Sharen Counter, MD

## 2022-03-10 NOTE — Consult Note (Signed)
[facility]    Date of Admission:  03/08/2022     Total days of antibiotics 3  Meropenem  12/27 > c            Reason for Consult: ESBL bacteriuria, ?hip infection     Referring Provider: Nelson Chimes Primary Care Provider: Pcp, No   Assessment: Charles [name] is a 61 y.o. male is a very nice gentleman here for hospitalization 2/2 to abdominal pain in the setting of what sounds to have been a malfunctioning/clotted chronic foley catheter. Urine culture with 100K ESBL producing E Coli. Since his foley was exchanged the abdominal pain and urinary retention have resolved.  He does not report any infectious symptoms prior to admission - general fatigue, chronic leukocytosis for 67m now and overall a FTT picture.   CT scan mentions asymmetric stranding of the right kidney concerning for possible pyelonephritis. He denies any fevers/rigors/chills, nausea, vomiting or flank pain. Not bacteremic @ 48h. Could have had ascending infection d/t mechanical obstruction of foley - would likely need tx x 10d again with ertapenam.   Low back pain - always a concern with chronic UTIs for vertebral infections, however he has compression fractures present of the T12, L1 and L3 vertebral bodies that explain his discomfort.   Chronic subcapital Rt femoral fracture (first seen on CT 08/19/2021 which has been increasingly displaced and now multiple heterotopic bone formations in the area with moderate fluid in the joint space + bony debris. Concern that these lucencies in the right femoral had could be due to osteonecrosis or potentially infectious. He has undergone hip aspiration to help determine. Has been on abx x 48h now but this should not influence the cell count to where it would be difficult to interpret results. Will follow - fluid described to be clear/transparent; hopeful this is only a reactive effusion.    Plan: Continue meropenem - presumed 10d course  Follow up hip  aspiration cell count / GS/cx Monitor back pain - low threshold to image to ensure no infection here.  Needs close outpatient FU for chronic FTT picture    Principal Problem:   Complicated UTI (urinary tract infection) Active Problems:   DM2 (diabetes mellitus, type 2) (HCC)   HLD (hyperlipidemia)   Pressure injury of skin   Chronic orthostatic hypotension   Physical deconditioning   Progressive neurologic decline   UTI due to extended-spectrum beta lactamase (ESBL) producing Escherichia coli    aspirin EC  81 mg Oral Daily   Chlorhexidine Gluconate Cloth  6 each Topical Q0600   docusate sodium  100 mg Oral BID   dronabinol  5 mg Oral BID AC   enoxaparin (LOVENOX) injection  20 mg Subcutaneous Q24H   ezetimibe  10 mg Oral Daily   feeding supplement (GLUCERNA SHAKE)  237 mL Oral TID WC   insulin aspart  0-9 Units Subcutaneous TID WC   insulin glargine-yfgn  18 Units Subcutaneous Daily   magnesium oxide  400 mg Oral Daily   midodrine  10 mg Oral TID WC   multivitamin with minerals  1 tablet Oral Daily   mupirocin ointment  1 Application Nasal BID   oxybutynin  5 mg Oral BID   pantoprazole  40 mg Oral QAC breakfast   polyethylene glycol  17 g Oral Daily   rosuvastatin  20 mg Oral Daily   thiamine  100 mg Oral Daily    HPI: Charles [name] is  a 61 y.o. male admitted with bladder fullness, lower back pain and fever.   Charles [name] states he has been having hip pain and inability to walk for months now and up until now he was not clear as to why. Makes sense now knowing he has fracture. He has had falls at home more recently as a result of this. He has been in a SNF recently due to debility and chronic catheter use. He states that his foley catheter stopped draining shortly before admission. On eval in ER he was noted to have >600 mL urine in bladder. Foley was exchanged and no further problems since this was done. He reports low back pain that is new over the last 2 months. It was  pretty bad before he came in. Now it is hard to know if it is improved or not for him.  He does not report having any chills or fevers or sweating episodes over the last few months.   CT scan in the ER done of A/P for abdominal pain - inflammatory changes to right kidney indicating possible pyelo - he does not have any upper back/flank pain.   He has been seeing Dr. Thedore Mins in [facility] this month for ESBL producing UTIs with recent 10-d course of ertapenem completed 12/11. Ongoing leukocytosis and elevated inflammatory markers with generalized fatigue complaint - referral to heme-onc planned outpatient.   He works in [employer] and does a lot of traveling back and forth all over the country.     Review of Systems: Review of Systems  Constitutional:  Positive for fever (x 1) and malaise/fatigue. Negative for chills.  Respiratory: Negative.    Cardiovascular: Negative.   Genitourinary:  Negative for flank pain and hematuria.       Retention with chronic foley catheter  Musculoskeletal:  Positive for back pain, falls and joint pain.  Skin:  Negative for itching and rash.    Past Medical History:  Diagnosis Date   Arthritis    Chronic diastolic heart failure (HCC)    Dementia (HCC)    Diabetes mellitus without complication (HCC)    Heart disease    History of recurrent UTIs    (has chronic indwelling foley catheter)   Hypertension     Social History   Tobacco Use   Smoking status: Never   Smokeless tobacco: Never  Vaping Use   Vaping Use: Never used  Substance Use Topics   Alcohol use: Not Currently    Comment: none in 6 months may open a bottle of wine tonight 11/1112022   Drug use: Never    Family History  Problem Relation Age of Onset   Diabetes Mother    Hypertension Mother    Diabetes Father    Hypertension Father    Heart attack Father    Diabetes Sister    Diabetes Sister    No Known Allergies  OBJECTIVE: Blood pressure 99/71, pulse 73, temperature 97.7 F  (36.5 C), resp. rate 16, height 5\' 6"  (1.676 m), weight 40.8 kg, SpO2 100 %.  Physical Exam Constitutional:      Comments: Thin appearing, malnourished man. Pleasant.   Cardiovascular:     Rate and Rhythm: Normal rate and regular rhythm.  Pulmonary:     Effort: Pulmonary effort is normal. No respiratory distress.     Breath sounds: Normal breath sounds.  Abdominal:     General: There is no distension.     Palpations: Abdomen is soft.     Tenderness:  There is no abdominal tenderness.  Skin:    General: Skin is warm and dry.     Capillary Refill: Capillary refill takes less than 2 seconds.  Neurological:     Mental Status: He is alert and oriented to person, place, and time.     Lab Results Lab Results  Component Value Date   WBC 19.0 (H) 03/10/2022   HGB 9.7 (L) 03/10/2022   HCT 30.3 (L) 03/10/2022   MCV 83.7 03/10/2022   PLT 347 03/10/2022    Lab Results  Component Value Date   CREATININE 0.62 03/10/2022   BUN 11 03/10/2022   NA 135 03/10/2022   K 3.9 03/10/2022   CL 104 03/10/2022   CO2 27 03/10/2022    Lab Results  Component Value Date   ALT 17 03/08/2022   AST 24 03/08/2022   ALKPHOS 90 03/08/2022   BILITOT 0.5 03/08/2022     Microbiology: Recent Results (from the past 240 hour(s))  Urine Culture     Status: Abnormal   Collection Time: 03/08/22  2:56 AM   Specimen: Urine, Catheterized  Result Value Ref Range Status   Specimen Description URINE, CATHETERIZED  Final   Special Requests   Final    NONE Performed at [facility], [address] [place], [place] [address]    Culture (A)  Final    >=100,000 COLONIES/mL ESCHERICHIA COLI Confirmed Extended Spectrum Beta-Lactamase Producer (ESBL).  In bloodstream infections from ESBL organisms, carbapenems are preferred over piperacillin/tazobactam. They are shown to have a lower risk of mortality.    Report Status 03/10/2022 FINAL  Final   Organism ID, Bacteria ESCHERICHIA COLI (A)  Final       Susceptibility   Escherichia coli - MIC*    AMPICILLIN >=32 RESISTANT Resistant     CEFAZOLIN >=64 RESISTANT Resistant     CEFEPIME 16 RESISTANT Resistant     CEFTRIAXONE >=64 RESISTANT Resistant     CIPROFLOXACIN >=4 RESISTANT Resistant     GENTAMICIN <=1 SENSITIVE Sensitive     IMIPENEM <=0.25 SENSITIVE Sensitive     NITROFURANTOIN <=16 SENSITIVE Sensitive     TRIMETH/SULFA >=320 RESISTANT Resistant     AMPICILLIN/SULBACTAM >=32 RESISTANT Resistant     PIP/TAZO 64 INTERMEDIATE Intermediate     * >=100,000 COLONIES/mL ESCHERICHIA COLI  Culture, blood (routine x 2)     Status: None (Preliminary result)   Collection Time: 03/08/22  6:18 AM   Specimen: BLOOD LEFT ARM  Result Value Ref Range Status   Specimen Description BLOOD LEFT ARM  Final   Special Requests   Final    BOTTLES DRAWN AEROBIC AND ANAEROBIC Blood Culture results may not be optimal due to an inadequate volume of blood received in culture bottles   Culture   Final    NO GROWTH 2 DAYS Performed at [facility], [address] [place], [place] [address]    Report Status PENDING  Incomplete  Culture, blood (routine x 2)     Status: None (Preliminary result)   Collection Time: 03/08/22  6:18 AM   Specimen: BLOOD RIGHT ARM  Result Value Ref Range Status   Specimen Description BLOOD RIGHT ARM  Final   Special Requests   Final    BOTTLES DRAWN AEROBIC AND ANAEROBIC Blood Culture results may not be optimal due to an inadequate volume of blood received in culture bottles   Culture   Final    NO GROWTH 2 DAYS Performed at [facility], [address] [place], [place] [address]    Report Status PENDING  Incomplete  MRSA Next Gen by PCR, Nasal     Status: Abnormal   Collection Time: 03/08/22  5:58 PM   Specimen: Nasal Mucosa; Nasal Swab  Result Value Ref Range Status   MRSA by PCR Next Gen DETECTED (A) NOT DETECTED Final    Comment: RESULT CALLED TO, READ BACK BY AND VERIFIED WITH: RN Catalina PizzaP. MARTIN [phone] @[phone]   FH (NOTE) The GeneXpert MRSA Assay (FDA approved for NASAL specimens only), is one component of a comprehensive MRSA colonization surveillance program. It is not intended to diagnose MRSA infection nor to guide or monitor treatment for MRSA infections. Test performance is not FDA approved in patients less than 59 years old. Performed at [facility], [address] [place], [place] [address]     Rexene AlbertsStephanie Tangie Stay, MSN, NP-C [facility] Pager: [phone]  03/10/2022 1:13 PM

## 2022-03-10 NOTE — H&P (Signed)
Patient Status: [facility] - In-pt  Assessment and Plan: Hip fracture, fluid collection Patient with fluid collection vs. Effusion by CT.  Patient assessed by ortho who has requested aspiration.  Case reviewed by Dr. Miles Costain who has approved aspiration only with CT guidance.   Patient assessed in CT.   Agreeable to proceed.  No sedation anticipated.   Risks and benefits was discussed with the patient and/or patient's family including, but not limited to bleeding, infection, damage to adjacent structures or low yield requiring additional tests.  All of the questions were answered and there is agreement to proceed.  Consent signed and in chart.  ______________________________________________________________________   History of Present Illness: Charles [name] is a 60 y.o. male with chronic debility who is admitted with UTI/urosepsis.  He does have a history of several falls and was incidentally found to have right hip fracture with joint effusion.  Traditional aspiration with fluoro requested, however due to severity of fracture request reviewed by IR MD.  Dr. Miles Costain has approved for aspiration in CT.   Allergies and medications reviewed.   Review of Systems: A 12 point ROS discussed and pertinent positives are indicated in the HPI above.  All other systems are negative.  Review of Systems  Constitutional:  Positive for fatigue. Negative for fever.  Respiratory:  Negative for cough and shortness of breath.   Cardiovascular:  Negative for chest pain.  Gastrointestinal:  Negative for abdominal pain.  Genitourinary:  Positive for difficulty urinating.  Musculoskeletal:  Positive for arthralgias and back pain.  Psychiatric/Behavioral:  Negative for behavioral problems and confusion.     Vital Signs: BP 111/64 (BP Location: Left Arm)   Pulse 74   Temp 98 F (36.7 C) (Oral)   Resp 15   Ht 5\' 6"  (1.676 m)   Wt 90 lb (40.8 kg)   SpO2 100%   BMI 14.53 kg/m   Physical Exam Vitals and  nursing note reviewed.  Constitutional:      Appearance: He is not ill-appearing.  Cardiovascular:     Rate and Rhythm: Normal rate and regular rhythm.  Skin:    General: Skin is warm and dry.  Neurological:     General: No focal deficit present.     Mental Status: He is alert and oriented to person, place, and time.  Psychiatric:        Mood and Affect: Mood normal.        Behavior: Behavior normal.      Imaging reviewed.   Labs:  COAGS: Recent Labs    06/30/21 1722 07/03/21 1332 08/24/21 0540 09/09/21 2346 11/29/21 1737 01/26/22 1718  INR 1.1   < > 1.0 1.0 1.0 1.0  APTT 40*  --   --  35  --  41*   < > = values in this interval not displayed.    BMP: Recent Labs    01/30/22 0256 02/06/22 0449 02/20/22 0928 03/08/22 0301 03/09/22 0243 03/10/22 0415  NA 133*   < > 138 135 132* 135  K 4.6   < > 3.5 4.4 4.2 3.9  CL 100   < > 102 100 98 104  CO2 26   < > 29 22 27 27   GLUCOSE 332*   < > 118* 203* 133* 90  BUN 12   < > 10 18 11 11   CALCIUM 8.9   < > 8.8 9.2 8.4* 8.6*  CREATININE 0.54*   < > 0.66* 0.99 0.84 0.62  GFRNONAA >60  --   --  >  60 >60 >60   < > = values in this interval not displayed.       Electronically Signed: Hoyt Koch, PA 03/10/2022, 8:59 AM   I spent a total of 15 minutes in face to face in clinical consultation, greater than 50% of which was counseling/coordinating care for hip aspiration.

## 2022-03-10 NOTE — Plan of Care (Signed)

## 2022-03-10 NOTE — Progress Notes (Signed)
PROGRESS NOTE    Charles [name]  [id number] DOB: March 05, 1961 DOA: 03/08/2022 PCP: Pcp, No   Brief Narrative:  61 year old with history of progressive neurologic decline, DM 2, chronic urinary retention with indwelling Foley presented to the hospital with abdominal pain.  Patient was also admitted about 6 weeks ago for UTI from Providencia and Enterococcus, treated with ertapenem.  At first she was doing okay but now having symptoms again.  CT scan showed acute pyelonephritis with other multiple abnormal findings.  Due to right hip fracture and fluid collection, Ortho consulted and planned hip/joint aspiration to evaluate for any sort of infection.   Assessment & Plan:  Principal Problem:   Complicated UTI (urinary tract infection) Active Problems:   DM2 (diabetes mellitus, type 2) (HCC)   HLD (hyperlipidemia)   Pressure injury of skin   Chronic orthostatic hypotension   Physical deconditioning   Progressive neurologic decline   UTI due to extended-spectrum beta lactamase (ESBL) producing Escherichia coli      Complicated UTI, previous history of Providencia, ESBL and Enterococcus. Acute pyelonephritis, right-sided -Patient has chronic indwelling Foley catheter.  This was exchanged in the ER and relieved his urinary retention.  Previous cultures reviewed, currently on empiric meropenem.  ID consulted.  Once cleared of bacteremia, likely will need PICC line Urine cultures growing ESBL  Right hip fracture -Appears that this was present on CT scan back in June 2023.  No follow-up or mention of that since.  I discussed case with Dr. Marcelino Scot from orthopedic who reviewed CAT scan from today and back in June.  Given concerns of active infection, s/p joint aspiration today.   Progressive neurological decline Chronic urinary retention -Patient is a young person with severe neurologic progression that has been thoroughly investigated without apparent cause -He has had Foley in place since  April 2023, changed during this admission -PT/OT consulted, pending further orthopedic eval -Continue Marinol, tizanidine   DM 2 Recent A1c 7.7.  On long-acting and sliding scale   HLD -Continue rosuvastatin, Zetia   Chronic hypotension -Continue midodrine   Pressure ulcer, sacral stage II.  PTA -Supportive care  Abnormal CT scan finding - Chronic subcapital right femoral fracture previously seen in June 2023.  Has increasing lucencies of that area concerning for osteonecrosis versus infection, management as mentioned above.  Continue supportive care - Has chronic anterior wedge compression fracture of thoracic and lumbar vertebrae.  Pain control - Constipation.  Bowel regimen - Mesenteric haziness/ascites.  Likely from malnutrition - Chronic calcified pancreatitis.  Supportive care   DNR -I have discussed code status with the patient and he would prefer to die a natural death should that situation arise. -She will need a gold out of facility DNR form at the time of discharge    Hypomagnesemia-repletion   DVT prophylaxis: Lovenox Code Status: DNR Family Communication:    Ongoing management for ESBL and evaluation for right hip fracture  Signs/Symptoms: percent weight loss, severe muscle depletion, severe fat depletion Percent weight loss: 14 % (in 6 months)  Interventions: Glucerna shake, Magic cup  Body mass index is 14.53 kg/m.  Pressure Injury 03/08/22 Sacrum Mid Stage 2 -  Partial thickness loss of dermis presenting as a shallow open injury with a red, pink wound bed without slough. Pink but not open (Active)  03/08/22 1115  Location: Sacrum  Location Orientation: Mid  Staging: Stage 2 -  Partial thickness loss of dermis presenting as a shallow open injury with a red, pink wound bed without  slough.  Wound Description (Comments): Pink but not open  Present on Admission: Yes        Subjective:  Feels ok, tolerated joint aspiration today.    Examination: Constitutional: Not in acute distress Respiratory: Clear to auscultation bilaterally Cardiovascular: Normal sinus rhythm, no rubs Abdomen: Nontender nondistended good bowel sounds Musculoskeletal: No edema noted Skin: No rashes seen Neurologic: CN 2-12 grossly intact.  And nonfocal Psychiatric: Normal judgment and insight. Alert and oriented x 3. Normal mood.    Chronic Foley in place, changed this admission Objective: Vitals:   03/09/22 0532 03/09/22 1624 03/09/22 2011 03/10/22 0412  BP: (!) 92/59 102/65 101/62 111/64  Pulse: 72 71 67 74  Resp: 18 15 20 15   Temp: 98.1 F (36.7 C) 98.1 F (36.7 C) 97.8 F (36.6 C) 98 F (36.7 C)  TempSrc: Oral Oral Oral Oral  SpO2: 100% 100% 100% 100%  Weight:      Height:        Intake/Output Summary (Last 24 hours) at 03/10/2022 0752 Last data filed at 03/10/2022 0210 Gross per 24 hour  Intake 814.94 ml  Output 1150 ml  Net -335.06 ml   Filed Weights   03/08/22 0247  Weight: 40.8 kg     Data Reviewed:   CBC: Recent Labs  Lab 03/08/22 0301 03/09/22 0243 03/10/22 0415  WBC 15.1* 28.1* 19.0*  NEUTROABS 12.0*  --   --   HGB 10.5* 8.7* 9.7*  HCT 31.8* 27.1* 30.3*  MCV 83.0 83.4 83.7  PLT 455* 354 347   Basic Metabolic Panel: Recent Labs  Lab 03/08/22 0301 03/09/22 0243 03/10/22 0415  NA 135 132* 135  K 4.4 4.2 3.9  CL 100 98 104  CO2 22 27 27   GLUCOSE 203* 133* 90  BUN 18 11 11   CREATININE 0.99 0.84 0.62  CALCIUM 9.2 8.4* 8.6*  MG  --   --  1.6*   GFR: Estimated Creatinine Clearance: 57.4 mL/min (by C-G formula based on SCr of 0.62 mg/dL). Liver Function Tests: Recent Labs  Lab 03/08/22 0301  AST 24  ALT 17  ALKPHOS 90  BILITOT 0.5  PROT 7.6  ALBUMIN 2.5*   Recent Labs  Lab 03/08/22 0301  LIPASE 29   No results for input(s): "AMMONIA" in the last 168 hours. Coagulation Profile: No results for input(s): "INR", "PROTIME" in the last 168 hours. Cardiac Enzymes: No results for  input(s): "CKTOTAL", "CKMB", "CKMBINDEX", "TROPONINI" in the last 168 hours. BNP (last 3 results) No results for input(s): "PROBNP" in the last 8760 hours. HbA1C: No results for input(s): "HGBA1C" in the last 72 hours. CBG: Recent Labs  Lab 03/08/22 1800 03/09/22 0850 03/09/22 1146 03/09/22 1650 03/09/22 2010  GLUCAP 248* 159* 165* 272* 111*   Lipid Profile: No results for input(s): "CHOL", "HDL", "LDLCALC", "TRIG", "CHOLHDL", "LDLDIRECT" in the last 72 hours. Thyroid Function Tests: No results for input(s): "TSH", "T4TOTAL", "FREET4", "T3FREE", "THYROIDAB" in the last 72 hours. Anemia Panel: No results for input(s): "VITAMINB12", "FOLATE", "FERRITIN", "TIBC", "IRON", "RETICCTPCT" in the last 72 hours. Sepsis Labs: Recent Labs  Lab 03/08/22 0301  LATICACIDVEN 1.3    Recent Results (from the past 240 hour(s))  Urine Culture     Status: Abnormal (Preliminary result)   Collection Time: 03/08/22  2:56 AM   Specimen: Urine, Catheterized  Result Value Ref Range Status   Specimen Description URINE, CATHETERIZED  Final   Special Requests   Final    NONE Performed at [facility],  [address] [place], [place] [address]    Culture >=100,000 COLONIES/mL ESCHERICHIA COLI (A)  Final   Report Status PENDING  Incomplete  Culture, blood (routine x 2)     Status: None (Preliminary result)   Collection Time: 03/08/22  6:18 AM   Specimen: BLOOD LEFT ARM  Result Value Ref Range Status   Specimen Description BLOOD LEFT ARM  Final   Special Requests   Final    BOTTLES DRAWN AEROBIC AND ANAEROBIC Blood Culture results may not be optimal due to an inadequate volume of blood received in culture bottles   Culture   Final    NO GROWTH 1 DAY Performed at [facility], [place] [address] [place], [place] [address]    Report Status PENDING  Incomplete  Culture, blood (routine x 2)     Status: None (Preliminary result)   Collection Time: 03/08/22  6:18 AM   Specimen: BLOOD RIGHT  ARM  Result Value Ref Range Status   Specimen Description BLOOD RIGHT ARM  Final   Special Requests   Final    BOTTLES DRAWN AEROBIC AND ANAEROBIC Blood Culture results may not be optimal due to an inadequate volume of blood received in culture bottles   Culture   Final    NO GROWTH 1 DAY Performed at [facility], [place] [address], [place], [place] [address]    Report Status PENDING  Incomplete  MRSA Next Gen by PCR, Nasal     Status: Abnormal   Collection Time: 03/08/22  5:58 PM   Specimen: Nasal Mucosa; Nasal Swab  Result Value Ref Range Status   MRSA by PCR Next Gen DETECTED (A) NOT DETECTED Final    Comment: RESULT CALLED TO, READ BACK BY AND VERIFIED WITH: RN Vilma Prader [phone] @2106  FH (NOTE) The GeneXpert MRSA Assay (FDA approved for NASAL specimens only), is one component of a comprehensive MRSA colonization surveillance program. It is not intended to diagnose MRSA infection nor to guide or monitor treatment for MRSA infections. Test performance is not FDA approved in patients less than 58 years old. Performed at [facility], [place] [address] [place], [place] [address]          Radiology Studies: No results found.      Scheduled Meds:  aspirin EC  81 mg Oral Daily   docusate sodium  100 mg Oral BID   dronabinol  5 mg Oral BID AC   enoxaparin (LOVENOX) injection  20 mg Subcutaneous Q24H   ezetimibe  10 mg Oral Daily   feeding supplement (GLUCERNA SHAKE)  237 mL Oral TID WC   insulin aspart  0-9 Units Subcutaneous TID WC   insulin glargine-yfgn  18 Units Subcutaneous Daily   magnesium oxide  400 mg Oral Daily   midodrine  10 mg Oral TID WC   multivitamin with minerals  1 tablet Oral Daily   oxybutynin  5 mg Oral BID   pantoprazole  40 mg Oral QAC breakfast   polyethylene glycol  17 g Oral Daily   rosuvastatin  20 mg Oral Daily   thiamine  100 mg Oral Daily   Continuous Infusions:  sodium chloride 75 mL/hr at 03/09/22 1629   meropenem (MERREM)  IV 1 g (03/09/22 2111)     LOS: 1 day   Time spent= 35 mins    Laekyn Rayos Arsenio Loader, MD [facility]  If 7PM-7AM, please contact night-coverage  03/10/2022, 7:52 AM

## 2022-03-11 DIAGNOSIS — N390 Urinary tract infection, site not specified: Secondary | ICD-10-CM | POA: Diagnosis not present

## 2022-03-11 LAB — CBC
HCT: 30 % — ABNORMAL LOW (ref 39.0–52.0)
Hemoglobin: 9.5 g/dL — ABNORMAL LOW (ref 13.0–17.0)
MCH: 26 pg (ref 26.0–34.0)
MCHC: 31.7 g/dL (ref 30.0–36.0)
MCV: 82.2 fL (ref 80.0–100.0)
Platelets: 351 10*3/uL (ref 150–400)
RBC: 3.65 MIL/uL — ABNORMAL LOW (ref 4.22–5.81)
RDW: 14.3 % (ref 11.5–15.5)
WBC: 12.3 10*3/uL — ABNORMAL HIGH (ref 4.0–10.5)
nRBC: 0 % (ref 0.0–0.2)

## 2022-03-11 LAB — BASIC METABOLIC PANEL
Anion gap: 10 (ref 5–15)
BUN: 8 mg/dL (ref 6–20)
CO2: 22 mmol/L (ref 22–32)
Calcium: 8.6 mg/dL — ABNORMAL LOW (ref 8.9–10.3)
Chloride: 102 mmol/L (ref 98–111)
Creatinine, Ser: 0.64 mg/dL (ref 0.61–1.24)
GFR, Estimated: >60 mL/min (ref >60)
Glucose, Bld: 235 mg/dL — ABNORMAL HIGH (ref 70–99)
Potassium: 4 mmol/L (ref 3.5–5.1)
Sodium: 134 mmol/L — ABNORMAL LOW (ref 135–145)

## 2022-03-11 LAB — GLUCOSE, CAPILLARY
Glucose-Capillary: 135 mg/dL — ABNORMAL HIGH (ref 70–99)
Glucose-Capillary: 150 mg/dL — ABNORMAL HIGH (ref 70–99)
Glucose-Capillary: 123 mg/dL — ABNORMAL HIGH (ref 70–99)
Glucose-Capillary: 156 mg/dL — ABNORMAL HIGH (ref 70–99)

## 2022-03-11 LAB — MAGNESIUM: Magnesium: 2 mg/dL (ref 1.7–2.4)

## 2022-03-11 MED ORDER — LACTULOSE 10 GM/15ML PO SOLN
30.0000 g | Freq: Two times a day (BID) | ORAL | Status: AC
  Administered 2022-03-11 (×2): 30 g via ORAL
  Filled 2022-03-11 (×3): qty 45

## 2022-03-11 NOTE — [place] FL2 (Cosign Needed)
[place] MEDICAID FL2 LEVEL OF CARE FORM     IDENTIFICATION  Patient Name: Charles [name] Birthdate: May 27, 1960 Sex: male Admission Date (Current Location): 03/08/2022  [facility] [place] Number:  Producer, television/film/video and Address:  [facility], [address] [place], [place] [address]      Provider Number: [id number]  Attending Physician Name and Address:  Dimple Nanas, MD  Relative Name and Phone Number:       Current Level of Care: Hospital Recommended Level of Care: Skilled Nursing Facility Prior Approval Number:    Date Approved/Denied:   PASRR Number: [id number]  Discharge Plan: SNF    Current Diagnoses: Patient Active Problem List   Diagnosis Date Noted   Pyelonephritis 03/10/2022   Closed fracture of left femur (HCC) 03/10/2022   UTI due to extended-spectrum beta lactamase (ESBL) producing Escherichia coli 03/09/2022   Complicated UTI (urinary tract infection) 03/08/2022   Progressive neurologic decline 03/08/2022   UTI (urinary tract infection) 01/26/2022   Chronic diastolic heart failure (HCC) 01/26/2022   History of orthostatic hypotension 01/26/2022   Dementia (HCC) 11/30/2021   Pressure injury of sacral region, stage 2 (HCC) 11/30/2021   Multiple fractures of ribs, left side, initial encounter for closed fracture 11/29/2021   Failure to thrive in adult 11/29/2021   Anemia of chronic disease 09/10/2021   Catheter-associated urinary tract infection (HCC) 08/27/2021   Goals of care, counseling/discussion 08/26/2021   Delirium 08/23/2021   Confusion 08/23/2021   Unintentional weight loss of more than 10% body weight within 6 months 08/21/2021   Hemorrhoids 08/21/2021   Diverticulosis of colon 08/21/2021   Solitary rectal ulcer 08/21/2021   Polyp of ascending colon 08/21/2021   Hypokalemia 08/21/2021   Acute blood loss anemia 08/20/2021   Chronic orthostatic hypotension 08/20/2021   Protein-calorie malnutrition,  severe (HCC) 08/20/2021   Hypomagnesemia 08/20/2021   Physical deconditioning 08/20/2021   Acute lower GI bleeding 08/19/2021   Pressure injury of skin 07/02/2021   Severe sepsis (HCC) 06/30/2021   Hypothermia 06/30/2021   Urinary retention 04/23/2021   Acute prerenal azotemia 04/22/2021   Leukocytosis 04/22/2021   HLD (hyperlipidemia) 04/22/2021   Acute metabolic encephalopathy 04/22/2021   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/23/2021   DM2 (diabetes mellitus, type 2) (HCC) 03/23/2021   BPH (benign prostatic hyperplasia) 02/08/2021   Phimosis 01/27/2021   Hypertension 12/23/2020   CAD S/p remote CABG 12/23/2020    Orientation RESPIRATION BLADDER Height & Weight     Self, Time, Situation, Place  Normal Incontinent, External catheter Weight: 90 lb (40.8 kg) Height:  5\' 6"  (167.6 cm)  BEHAVIORAL SYMPTOMS/MOOD NEUROLOGICAL BOWEL NUTRITION STATUS      Continent Diet (See dc summary)  AMBULATORY STATUS COMMUNICATION OF NEEDS Skin   Extensive Assist Verbally Normal                       Personal Care Assistance Level of Assistance  Bathing, Feeding, Dressing Bathing Assistance: Maximum assistance Feeding assistance: Limited assistance Dressing Assistance: Maximum assistance     Functional Limitations Info  Sight, Speech, Hearing Sight Info: Adequate Hearing Info: Adequate Speech Info: Adequate    SPECIAL CARE FACTORS FREQUENCY                       Contractures Contractures Info: Not present    Additional Factors Info  Code Status Code Status Info: DNR  Current Medications (03/11/2022):  This is the current hospital active medication list Current Facility-Administered Medications  Medication Dose Route Frequency Provider Last Rate Last Admin   acetaminophen (TYLENOL) tablet 650 mg  650 mg Oral Q6H PRN Jonah Blue, MD       Or   acetaminophen (TYLENOL) suppository 650 mg  650 mg Rectal Q6H PRN Jonah Blue, MD       aspirin EC tablet 81 mg  81 mg Oral  Daily Jonah Blue, MD   81 mg at 03/11/22 1009   bisacodyl (DULCOLAX) EC tablet 5 mg  5 mg Oral Daily PRN Jonah Blue, MD       Chlorhexidine Gluconate Cloth 2 % PADS 6 each  6 each Topical Q0600 Dimple Nanas, MD   6 each at 03/11/22 0509   docusate sodium (COLACE) capsule 100 mg  100 mg Oral BID Jonah Blue, MD   100 mg at 03/11/22 1009   dronabinol (MARINOL) capsule 5 mg  5 mg Oral BID Magda Paganini, MD   5 mg at 03/11/22 0845   enoxaparin (LOVENOX) 100 mg/mL injection 20 mg  20 mg Subcutaneous Q24H Jonah Blue, MD   20 mg at 03/10/22 1759   ezetimibe (ZETIA) tablet 10 mg  10 mg Oral Daily Jonah Blue, MD   10 mg at 03/11/22 1009   feeding supplement (GLUCERNA SHAKE) (GLUCERNA SHAKE) liquid 237 mL  237 mL Oral TID WC Amin, Ankit Chirag, MD   237 mL at 03/11/22 1011   guaiFENesin (ROBITUSSIN) 100 MG/5ML liquid 5 mL  5 mL Oral Q4H PRN Amin, Ankit Chirag, MD       hydrALAZINE (APRESOLINE) injection 10 mg  10 mg Intravenous Q4H PRN Amin, Ankit Chirag, MD       insulin aspart (novoLOG) injection 0-9 Units  0-9 Units Subcutaneous TID WC Jonah Blue, MD   1 Units at 03/11/22 0846   insulin glargine-yfgn (SEMGLEE) injection 18 Units  18 Units Subcutaneous Daily Jonah Blue, MD   18 Units at 03/11/22 1009   ipratropium-albuterol (DUONEB) 0.5-2.5 (3) MG/3ML nebulizer solution 3 mL  3 mL Nebulization Q4H PRN Amin, Ankit Chirag, MD       lactulose (CHRONULAC) 10 GM/15ML solution 30 g  30 g Oral BID Amin, Ankit Chirag, MD   30 g at 03/11/22 0845   magnesium oxide (MAG-OX) tablet 400 mg  400 mg Oral Daily Jonah Blue, MD   400 mg at 03/11/22 1010   meropenem (MERREM) 1 g in sodium chloride 0.9 % 100 mL IVPB  1 g Intravenous Q8H Amin, Ankit Chirag, MD 200 mL/hr at 03/11/22 0513 1 g at 03/11/22 0513   metoprolol tartrate (LOPRESSOR) injection 5 mg  5 mg Intravenous Q4H PRN Amin, Ankit Chirag, MD       midodrine (PROAMATINE) tablet 10 mg  10 mg Oral TID WC Jonah Blue, MD   10 mg at 03/11/22 0846   multivitamin with minerals tablet 1 tablet  1 tablet Oral Daily Jonah Blue, MD   1 tablet at 03/11/22 1009   mupirocin ointment (BACTROBAN) 2 % 1 Application  1 Application Nasal BID Dimple Nanas, MD   1 Application at 03/11/22 1010   ondansetron (ZOFRAN) tablet 4 mg  4 mg Oral Q6H PRN Jonah Blue, MD       Or   ondansetron [facility]) injection 4 mg  4 mg Intravenous Q6H PRN Jonah Blue, MD       oxybutynin (DITROPAN) tablet 5 mg  5 mg Oral BID Jonah Blue, MD   5 mg at 03/11/22 1010   oxyCODONE (Oxy IR/ROXICODONE) immediate release tablet 5 mg  5 mg Oral Q4H PRN Jonah Blue, MD   5 mg at 03/09/22 1432   pantoprazole (PROTONIX) EC tablet 40 mg  40 mg Oral QAC breakfast Jonah Blue, MD   40 mg at 03/11/22 0846   polyethylene glycol (MIRALAX / GLYCOLAX) packet 17 g  17 g Oral Daily Jonah Blue, MD   17 g at 03/11/22 1010   rosuvastatin (CRESTOR) tablet 20 mg  20 mg Oral Daily Jonah Blue, MD   20 mg at 03/11/22 1009   thiamine (VITAMIN B1) tablet 100 mg  100 mg Oral Daily Jonah Blue, MD   100 mg at 03/11/22 1009   tiZANidine (ZANAFLEX) tablet 2 mg  2 mg Oral Q8H PRN Jonah Blue, MD       traZODone (DESYREL) tablet 50 mg  50 mg Oral QHS PRN Dimple Nanas, MD         Discharge Medications: Please see discharge summary for a list of discharge medications.  Relevant Imaging Results:  Relevant Lab Results:   Additional Information SSN: [id number] with hospice  Oletta Lamas, MSW, LCSWA, LCASA Transitions of Care  Clinical Social Worker I

## 2022-03-11 NOTE — Plan of Care (Signed)

## 2022-03-11 NOTE — Progress Notes (Addendum)
Brief ID note:  Patient afebrile over the last 24 hours, Tmax 98.1 Labs this morning with improved leukocytosis, stable creatinine.  His blood cultures from admission remain negative and left hip aspiration cultures with no organisms on Gram stain and cultures negative thus far.  Cell count from the aspiration is pending (hopefully this was able to be obtained).  Lumbar MRI was obtained which fortunately showed no evidence of infection involving the lumbar spine.  No new changes today.  Will continue with meropenem pending further results from his left hip aspirate.  This is also currently treating his recurrent UTI in the setting of Foley catheter obstruction.  Dr Thedore Mins here tomorrow and Monday.    Vedia Coffer for Infectious Disease [facility] 03/11/2022, 10:33 AM

## 2022-03-11 NOTE — Progress Notes (Signed)
PROGRESS NOTE    Charles [name]  QIH:[id number] DOB: Sep 29, 1960 DOA: 03/08/2022 PCP: Pcp, No   Brief Narrative:  61 year old with history of progressive neurologic decline, DM 2, chronic urinary retention with indwelling Foley presented to the hospital with abdominal pain.  Patient was also admitted about 6 weeks ago for UTI from Providencia and Enterococcus, treated with ertapenem.  At first she was doing okay but now having symptoms again.  CT scan showed acute pyelonephritis with other multiple abnormal findings.  Due to right hip fracture and fluid collection, Ortho consulted and planned hip/joint aspiration to evaluate for any sort of infection.  Hip aspiration performed on 12/29 which does not show any obvious evidence of infection.   Assessment & Plan:  Principal Problem:   Complicated UTI (urinary tract infection) Active Problems:   DM2 (diabetes mellitus, type 2) (HCC)   HLD (hyperlipidemia)   Pressure injury of skin   Chronic orthostatic hypotension   Physical deconditioning   Progressive neurologic decline   UTI due to extended-spectrum beta lactamase (ESBL) producing Escherichia coli   Pyelonephritis   Closed fracture of left femur (HCC)      Complicated UTI, previous history of Providencia, ESBL and Enterococcus. Acute pyelonephritis, right-sided -Patient has chronic indwelling Foley catheter.  This was exchanged in the ER and relieved his urinary retention.  Currently on meropenem, ID is following.  Cultures are growing ESBL again  Right hip fracture -Appears that this was present on CT scan back in June 2023.  No follow-up or mention of that since.  I discussed case with Dr. Mardelle Matte from orthopedic, Gram stain is negative but it does not look like cell count was ran because of lack of enough sampling.  Eventually will require hip replacement MRI lumbar spine does not show any evidence of osteomyelitis/discitis   Progressive neurological decline Chronic urinary  retention -Patient is a young person with severe neurologic progression that has been thoroughly investigated without apparent cause -He has had Foley in place since April 2023, changed during this admission -PT/OT eventually -Continue Marinol, tizanidine   DM 2 Recent A1c 7.7.  On long-acting and sliding scale   HLD -Continue rosuvastatin, Zetia   Chronic hypotension -Continue midodrine   Pressure ulcer, sacral stage II.  PTA -Supportive care  Abnormal CT scan finding - Chronic subcapital right femoral fracture previously seen in June 2023.  Has increasing lucencies of that area concerning for osteonecrosis versus infection, management as mentioned above.  Continue supportive care - Has chronic anterior wedge compression fracture of thoracic and lumbar vertebrae.  Pain control - Constipation.  Bowel regimen.  Will also give lactulose today - Mesenteric haziness/ascites.  Likely from malnutrition - Chronic calcified pancreatitis.  Supportive care   DNR -I have discussed code status with the patient and he would prefer to die a natural death should that situation arise. -She will need a gold out of facility DNR form at the time of discharge    Hypomagnesemia-repletion   DVT prophylaxis: Lovenox Code Status: DNR Family Communication:    Ongoing management for ESBL and evaluation for right hip fracture  Signs/Symptoms: percent weight loss, severe muscle depletion, severe fat depletion Percent weight loss: 14 % (in 6 months)  Interventions: Glucerna shake, Magic cup  Body mass index is 14.53 kg/m.  Pressure Injury 03/08/22 Sacrum Mid Stage 2 -  Partial thickness loss of dermis presenting as a shallow open injury with a red, pink wound bed without slough. Pink but not open (Active)  03/08/22  1115  Location: Sacrum  Location Orientation: Mid  Staging: Stage 2 -  Partial thickness loss of dermis presenting as a shallow open injury with a red, pink wound bed without slough.   Wound Description (Comments): Pink but not open  Present on Admission: Yes        Subjective:  Tells me his bladder feels full, bladder scan does not show any urinary retention.  He is constipated looking and MRI results.  I advised him to take oral lactulose and bowel regimen today which has been prescribed.  Examination: Constitutional: Not in acute distress Respiratory: Clear to auscultation bilaterally Cardiovascular: Normal sinus rhythm, no rubs Abdomen: Nontender nondistended good bowel sounds Musculoskeletal: No edema noted Skin: No rashes seen Neurologic: CN 2-12 grossly intact.  And nonfocal Psychiatric: Normal judgment and insight. Alert and oriented x 3. Normal mood. Chronic Foley in place, changed this admission Objective: Vitals:   03/10/22 1657 03/10/22 2034 03/11/22 0332 03/11/22 0750  BP: (!) 125/90 121/65 101/67 (!) 110/51  Pulse: 81 79 73 71  Resp: Temp: 97.6 F (36.4 C) 98.1 F (36.7 C) 97.6 F (36.4 C) (!) 97.4 F (36.3 C)  TempSrc: Oral Oral Oral Oral  SpO2: 100% 100% 100% 100%  Weight:      Height:        Intake/Output Summary (Last 24 hours) at 03/11/2022 1103 Last data filed at 03/11/2022 0436 Gross per 24 hour  Intake --  Output 2 ml  Net -2 ml   Filed Weights   03/08/22 0247  Weight: 40.8 kg     Data Reviewed:   CBC: Recent Labs  Lab 03/08/22 0301 03/09/22 0243 03/10/22 0415 03/11/22 0401  WBC 15.1* 28.1* 19.0* 12.3*  NEUTROABS 12.0*  --   --   --   HGB 10.5* 8.7* 9.7* 9.5*  HCT 31.8* 27.1* 30.3* 30.0*  MCV 83.0 83.4 83.7 82.2  PLT 455* 354 347 351   Basic Metabolic Panel: Recent Labs  Lab 03/08/22 0301 03/09/22 0243 03/10/22 0415 03/11/22 0401  NA 135 132* 135 134*  K 4.4 4.2 3.9 4.0  CL 100 98 104 102  CO2 GLUCOSE 203* 133* 90 235*  BUN CREATININE 0.99 0.84 0.62 0.64  CALCIUM 9.2 8.4* 8.6* 8.6*  MG  --   --  1.6* 2.0   GFR: Estimated Creatinine Clearance: 57.4 mL/min  (by C-G formula based on SCr of 0.64 mg/dL). Liver Function Tests: Recent Labs  Lab 03/08/22 0301  AST 24  ALT 17  ALKPHOS 90  BILITOT 0.5  PROT 7.6  ALBUMIN 2.5*   Recent Labs  Lab 03/08/22 0301  LIPASE 29   No results for input(s): "AMMONIA" in the last 168 hours. Coagulation Profile: No results for input(s): "INR", "PROTIME" in the last 168 hours. Cardiac Enzymes: No results for input(s): "CKTOTAL", "CKMB", "CKMBINDEX", "TROPONINI" in the last 168 hours. BNP (last 3 results) No results for input(s): "PROBNP" in the last 8760 hours. HbA1C: No results for input(s): "HGBA1C" in the last 72 hours. CBG: Recent Labs  Lab 03/10/22 1031 03/10/22 1154 03/10/22 1733 03/10/22 2049 03/11/22 0751  GLUCAP 104* 148* 179* 199* 135*   Lipid Profile: No results for input(s): "CHOL", "HDL", "LDLCALC", "TRIG", "CHOLHDL", "LDLDIRECT" in the last 72 hours. Thyroid Function Tests: No results for input(s): "TSH", "T4TOTAL", "FREET4", "T3FREE", "THYROIDAB" in the last 72 hours. Anemia Panel: No results for input(s): "VITAMINB12", "FOLATE", "FERRITIN", "TIBC", "IRON", "  RETICCTPCT" in the last 72 hours. Sepsis Labs: Recent Labs  Lab 03/08/22 0301  LATICACIDVEN 1.3    Recent Results (from the past 240 hour(s))  Urine Culture     Status: Abnormal   Collection Time: 03/08/22  2:56 AM   Specimen: Urine, Catheterized  Result Value Ref Range Status   Specimen Description URINE, CATHETERIZED  Final   Special Requests   Final    NONE Performed at [facility], [address] [place], [place] [address]    Culture (A)  Final    >=100,000 COLONIES/mL ESCHERICHIA COLI Confirmed Extended Spectrum Beta-Lactamase Producer (ESBL).  In bloodstream infections from ESBL organisms, carbapenems are preferred over piperacillin/tazobactam. They are shown to have a lower risk of mortality.    Report Status 03/10/2022 FINAL  Final   Organism ID, Bacteria ESCHERICHIA COLI (A)  Final       Susceptibility   Escherichia coli - MIC*    AMPICILLIN >=32 RESISTANT Resistant     CEFAZOLIN >=64 RESISTANT Resistant     CEFEPIME 16 RESISTANT Resistant     CEFTRIAXONE >=64 RESISTANT Resistant     CIPROFLOXACIN >=4 RESISTANT Resistant     GENTAMICIN <=1 SENSITIVE Sensitive     IMIPENEM <=0.25 SENSITIVE Sensitive     NITROFURANTOIN <=16 SENSITIVE Sensitive     TRIMETH/SULFA >=320 RESISTANT Resistant     AMPICILLIN/SULBACTAM >=32 RESISTANT Resistant     PIP/TAZO 64 INTERMEDIATE Intermediate     * >=100,000 COLONIES/mL ESCHERICHIA COLI  Culture, blood (routine x 2)     Status: None (Preliminary result)   Collection Time: 03/08/22  6:18 AM   Specimen: BLOOD LEFT ARM  Result Value Ref Range Status   Specimen Description BLOOD LEFT ARM  Final   Special Requests   Final    BOTTLES DRAWN AEROBIC AND ANAEROBIC Blood Culture results may not be optimal due to an inadequate volume of blood received in culture bottles   Culture   Final    NO GROWTH 3 DAYS Performed at [facility], [address] [place], [place] [address]    Report Status PENDING  Incomplete  Culture, blood (routine x 2)     Status: None (Preliminary result)   Collection Time: 03/08/22  6:18 AM   Specimen: BLOOD RIGHT ARM  Result Value Ref Range Status   Specimen Description BLOOD RIGHT ARM  Final   Special Requests   Final    BOTTLES DRAWN AEROBIC AND ANAEROBIC Blood Culture results may not be optimal due to an inadequate volume of blood received in culture bottles   Culture   Final    NO GROWTH 3 DAYS Performed at [facility], [address] [place], [place] [address]    Report Status PENDING  Incomplete  MRSA Next Gen by PCR, Nasal     Status: Abnormal   Collection Time: 03/08/22  5:58 PM   Specimen: Nasal Mucosa; Nasal Swab  Result Value Ref Range Status   MRSA by PCR Next Gen DETECTED (A) NOT DETECTED Final    Comment: RESULT CALLED TO, READ BACK BY AND VERIFIED WITH: RN Catalina PizzaP. MARTIN [phone]   FH (NOTE) The GeneXpert MRSA Assay (FDA approved for NASAL specimens only), is one component of a comprehensive MRSA colonization surveillance program. It is not intended to diagnose MRSA infection nor to guide or monitor treatment for MRSA infections. Test performance is not FDA approved in patients less than 448 years old. Performed at [facility],  [address], [place], [place] [address]   Body fluid culture w Gram Stain     Status: None (Preliminary result)   Collection Time: 03/10/22  7:57 AM   Specimen: Other Source; Body Fluid  Result Value Ref Range Status   Specimen Description OTHER  Final   Special Requests  RIGHT HIP  Final   Gram Stain   Final    RARE WBC PRESENT, PREDOMINANTLY PMN NO ORGANISMS SEEN    Culture   Final    NO GROWTH < 24 HOURS Performed at [facility], [address] [place], [place] [address]    Report Status PENDING  Incomplete         Radiology Studies: MR Lumbar Spine W Wo Contrast  Result Date: 03/11/2022 CLINICAL DATA:  Low back pain, infection suspected EXAM: MRI LUMBAR SPINE WITHOUT AND WITH CONTRAST TECHNIQUE: Multiplanar and multiecho pulse sequences of the lumbar spine were obtained without and with intravenous contrast. CONTRAST:  29mL GADAVIST GADOBUTROL 1 MMOL/ML IV SOLN COMPARISON:  Lumbar spine MRI 04/12/2021 CT abdomen pelvis 08/19/2021 FINDINGS: Segmentation:  Standard Alignment:  Normal Vertebrae: Superior endplate Schmorl's nodes at L3 and L5 present on CT abdomen pelvis 08/19/2021. No acute abnormality. No discitis-osteomyelitis. Conus medullaris and cauda equina: Conus extends to the L1 level. Conus and cauda equina appear normal. Paraspinal and other soft tissues: Large amount of stool in the colon. Disc levels: T12-L1: Normal. L1-L2: Normal disc space and facet joints. No spinal canal stenosis. No neural foraminal stenosis. L2-L3: Normal disc space and facet joints. No spinal canal stenosis. No neural foraminal  stenosis. L3-L4: Normal disc space and facet joints. No spinal canal stenosis. No neural foraminal stenosis. L4-L5: Small disc bulge. Normal facets. No spinal canal stenosis. Mild left neural foraminal stenosis. L5-S1: Normal disc space and facet joints. No spinal canal stenosis. No neural foraminal stenosis. Visualized sacrum: Normal. IMPRESSION: 1. No discitis-osteomyelitis or epidural abscess. 2. Mild left L4-5 neural foraminal stenosis. 3. Large amount of stool in the colon. Correlate for symptoms of constipation. Electronically Signed   By: Deatra Robinson M.D.   On: 03/11/2022 00:38   CT GUIDED NEEDLE PLACEMENT  Result Date: 03/10/2022 INDICATION: Right hip nonunion fracture with enlarging complex effusion EXAM: CT ASPIRATION RIGHT HIP JOINT MEDICATIONS: 1% lidocaine local ANESTHESIA/SEDATION: None. COMPLICATIONS: None immediate. PROCEDURE: Informed written consent was obtained from the patient after a thorough discussion of the procedural risks, benefits and alternatives. All questions were addressed. Maximal Sterile Barrier Technique was utilized including caps, mask, sterile gowns, sterile gloves, sterile drape, hand hygiene and skin antiseptic. A timeout was performed prior to the initiation of the procedure. Previous imaging reviewed. Patient positioned supine. Noncontrast localization CT performed. The complex right hip joint effusion was localized and marked. This was correlated with the recent CT 2 days ago. Under sterile conditions and local anesthesia, an 18 gauge needle was advanced into the hip joint fluid along the nonunion fracture. Needle position confirmed with CT. Syringe aspiration and needle manipulation yielded 2 cc clear synovial fluid. Sample sent for laboratory analysis and culture as ordered. Needle removed. Patient tolerated procedure well. No immediate complication. IMPRESSION: Successful CT-guided right hip joint aspiration. Electronically Signed   By: Judie Petit.  Shick M.D.   On:  03/10/2022 10:24        Scheduled Meds:  aspirin EC  81 mg Oral Daily   Chlorhexidine Gluconate Cloth  6 each Topical Q0600   docusate sodium  100 mg Oral BID  dronabinol  5 mg Oral BID AC   enoxaparin (LOVENOX) injection  20 mg Subcutaneous Q24H   ezetimibe  10 mg Oral Daily   feeding supplement (GLUCERNA SHAKE)  237 mL Oral TID WC   insulin aspart  0-9 Units Subcutaneous TID WC   insulin glargine-yfgn  18 Units Subcutaneous Daily   lactulose  30 g Oral BID   magnesium oxide  400 mg Oral Daily   midodrine  10 mg Oral TID WC   multivitamin with minerals  1 tablet Oral Daily   mupirocin ointment  1 Application Nasal BID   oxybutynin  5 mg Oral BID   pantoprazole  40 mg Oral QAC breakfast   polyethylene glycol  17 g Oral Daily   rosuvastatin  20 mg Oral Daily   thiamine  100 mg Oral Daily   Continuous Infusions:  meropenem (MERREM) IV 1 g (03/11/22 0513)     LOS: 2 days   Time spent= 35 mins    Deyvi Bonanno Joline Maxcy, MD [facility]  If 7PM-7AM, please contact night-coverage  03/11/2022, 11:03 AM

## 2022-03-11 NOTE — Progress Notes (Signed)
  [facility] 2W30 [facility]) Hospital Liaison Note   [facility] has received a referral from [facility] for this patient to return to the facility with hospice services.   Hospital Liaison will continue to follow for discharge disposition.   Please call with any hospice questions, concerns or questions.   Thank you. Haynes Bast, BSN, RN [facility] Liaison [phone]

## 2022-03-12 DIAGNOSIS — N390 Urinary tract infection, site not specified: Secondary | ICD-10-CM | POA: Diagnosis not present

## 2022-03-12 DIAGNOSIS — S7292XS Unspecified fracture of left femur, sequela: Secondary | ICD-10-CM

## 2022-03-12 LAB — CBC
HCT: 29.4 % — ABNORMAL LOW (ref 39.0–52.0)
Hemoglobin: 9.7 g/dL — ABNORMAL LOW (ref 13.0–17.0)
MCH: 27.2 pg (ref 26.0–34.0)
MCHC: 33 g/dL (ref 30.0–36.0)
MCV: 82.4 fL (ref 80.0–100.0)
Platelets: 406 10*3/uL — ABNORMAL HIGH (ref 150–400)
RBC: 3.57 MIL/uL — ABNORMAL LOW (ref 4.22–5.81)
RDW: 14.4 % (ref 11.5–15.5)
WBC: 11.4 10*3/uL — ABNORMAL HIGH (ref 4.0–10.5)
nRBC: 0 % (ref 0.0–0.2)

## 2022-03-12 LAB — GLUCOSE, CAPILLARY
Glucose-Capillary: 201 mg/dL — ABNORMAL HIGH (ref 70–99)
Glucose-Capillary: 230 mg/dL — ABNORMAL HIGH (ref 70–99)
Glucose-Capillary: 161 mg/dL — ABNORMAL HIGH (ref 70–99)
Glucose-Capillary: 179 mg/dL — ABNORMAL HIGH (ref 70–99)

## 2022-03-12 LAB — BASIC METABOLIC PANEL WITH GFR
Anion gap: 10 (ref 5–15)
BUN: 9 mg/dL (ref 6–20)
CO2: 26 mmol/L (ref 22–32)
Calcium: 8.5 mg/dL — ABNORMAL LOW (ref 8.9–10.3)
Chloride: 100 mmol/L (ref 98–111)
Creatinine, Ser: 0.62 mg/dL (ref 0.61–1.24)
GFR, Estimated: >60 mL/min
Glucose, Bld: 262 mg/dL — ABNORMAL HIGH (ref 70–99)
Potassium: 3.4 mmol/L — ABNORMAL LOW (ref 3.5–5.1)
Sodium: 136 mmol/L (ref 135–145)

## 2022-03-12 LAB — MAGNESIUM: Magnesium: 1.9 mg/dL (ref 1.7–2.4)

## 2022-03-12 LAB — PHOSPHORUS: Phosphorus: 2.4 mg/dL — ABNORMAL LOW (ref 2.5–4.6)

## 2022-03-12 MED ORDER — INSULIN GLARGINE-YFGN 100 UNIT/ML ~~LOC~~ SOLN
22.0000 [IU] | Freq: Every day | SUBCUTANEOUS | Status: DC
  Administered 2022-03-13 – 2022-03-15 (×2): 22 [IU] via SUBCUTANEOUS
  Filled 2022-03-12 (×3): qty 0.22

## 2022-03-12 MED ORDER — POLYETHYLENE GLYCOL 3350 17 G PO PACK
17.0000 g | PACK | Freq: Two times a day (BID) | ORAL | Status: DC
  Filled 2022-03-12: qty 1

## 2022-03-12 MED ORDER — POTASSIUM CHLORIDE CRYS ER 20 MEQ PO TBCR
40.0000 meq | EXTENDED_RELEASE_TABLET | Freq: Once | ORAL | Status: AC
  Administered 2022-03-12: 40 meq via ORAL
  Filled 2022-03-12: qty 2

## 2022-03-12 NOTE — Progress Notes (Signed)
[facility] 2W30 [facility]) Hospice hospital liaison note  [facility] liaison following for discharge disposition back to [facility] with plans for [facility] hospice to follow in facility.   Please don't hesitate to call for any hospice related questions or concerns.   Thea Gist, BSN, [employer] hospital liaison [phone]

## 2022-03-12 NOTE — Progress Notes (Signed)
PROGRESS NOTE    Charles [name]  JKD:[id number] DOB: 1960/08/30 DOA: 03/08/2022 PCP: Pcp, No   Brief Narrative:  61 year old with history of progressive neurologic decline, DM 2, chronic urinary retention with indwelling Foley presented to the hospital with abdominal pain.  Patient was also admitted about 6 weeks ago for UTI from Providencia and Enterococcus, treated with ertapenem.  At first she was doing okay but now having symptoms again.  CT scan showed acute pyelonephritis with other multiple abnormal findings.  Due to right hip fracture and fluid collection, Ortho consulted and planned hip/joint aspiration to evaluate for any sort of infection.  Hip aspiration performed on 12/29 which does not show any obvious evidence of infection.   Assessment & Plan:  Principal Problem:   Complicated UTI (urinary tract infection) Active Problems:   DM2 (diabetes mellitus, type 2) (HCC)   HLD (hyperlipidemia)   Pressure injury of skin   Chronic orthostatic hypotension   Physical deconditioning   Progressive neurologic decline   UTI due to extended-spectrum beta lactamase (ESBL) producing Escherichia coli   Pyelonephritis   Closed fracture of left femur (HCC)      Complicated UTI, previous history of Providencia, ESBL and Enterococcus. Acute pyelonephritis, right-sided -Patient has chronic indwelling Foley catheter.  This was exchanged in the ER and relieved his urinary retention.   -Antibiotic management per ID, he remains on meropenem . -Urine culture growing ESBL again    Right hip fracture -Appears that this was present on CT scan back in June 2023.  No follow-up or mention of that since.  I discussed case with Dr. Mardelle Matte from orthopedic, Gram stain is negative but it does not look like cell count was ran because of lack of enough sampling.  Eventually will require hip replacement MRI lumbar spine does not show any evidence of osteomyelitis/discitis   Progressive neurological  decline Chronic urinary retention -Patient is a young person with severe neurologic progression that has been thoroughly investigated without apparent cause -He has had Foley in place since April 2023, changed during this admission -PT/OT eventually -Continue Marinol, tizanidine   DM 2 Recent A1c 7.7.  On long-acting and sliding scale   HLD -Continue rosuvastatin, Zetia   Chronic hypotension -Continue midodrine   Pressure ulcer, sacral stage II.  PTA -Supportive care  Abnormal CT scan finding - Chronic subcapital right femoral fracture previously seen in June 2023.  Has increasing lucencies of that area concerning for osteonecrosis versus infection, management as mentioned above.  Continue supportive care - Has chronic anterior wedge compression fracture of thoracic and lumbar vertebrae.  Pain control - Constipation.  Bowel regimen.  Will also give lactulose today - Mesenteric haziness/ascites.  Likely from malnutrition - Chronic calcified pancreatitis.  Supportive care   DNR -I have discussed code status with the patient and he would prefer to die a natural death should that situation arise. -She will need a gold out of facility DNR form at the time of discharge    Hypomagnesemia-repletion   DVT prophylaxis: Lovenox Code Status: DNR Family Communication:    Ongoing management for ESBL and evaluation for right hip fracture  Signs/Symptoms: percent weight loss, severe muscle depletion, severe fat depletion Percent weight loss: 14 % (in 6 months)  Interventions: Glucerna shake, Magic cup  Body mass index is 14.53 kg/m.  Pressure Injury 03/08/22 Sacrum Mid Stage 2 -  Partial thickness loss of dermis presenting as a shallow open injury with a red, pink wound bed without slough. Pink but  not open (Active)  03/08/22 1115  Location: Sacrum  Location Orientation: Mid  Staging: Stage 2 -  Partial thickness loss of dermis presenting as a shallow open injury with a red, pink  wound bed without slough.  Wound Description (Comments): Pink but not open  Present on Admission: Yes        Subjective:  No significant events overnight,   Examination:  Awake Alert, Oriented X 3, tremulous frail, chronically ill-appearing Symmetrical Chest wall movement, Good air movement bilaterally, CTAB RRR,No Gallops,Rubs or new Murmurs, No Parasternal Heave +ve B.Sounds, Abd Soft, No tenderness, No rebound - guarding or rigidity. No Cyanosis, Clubbing or edema, No new Rash or bruise   Foley present   Objective: Vitals:   03/11/22 1619 03/11/22 1955 03/12/22 0425 03/12/22 0748  BP: 114/72 106/72 137/75 125/73  Pulse: 75 82 89 78  Resp:   16   Temp: 97.6 F (36.4 C) 98 F (36.7 C) 98.6 F (37 C) 97.7 F (36.5 C)  TempSrc: Oral Oral Oral Oral  SpO2: 100% 100% 97% 100%  Weight:      Height:        Intake/Output Summary (Last 24 hours) at 03/12/2022 1247 Last data filed at 03/12/2022 1045 Gross per 24 hour  Intake 640 ml  Output 1450 ml  Net -810 ml   Filed Weights   03/08/22 0247  Weight: 40.8 kg     Data Reviewed:   CBC: Recent Labs  Lab 03/08/22 0301 03/09/22 0243 03/10/22 0415 03/11/22 0401 03/12/22 0202  WBC 15.1* 28.1* 19.0* 12.3* 11.4*  NEUTROABS 12.0*  --   --   --   --   HGB 10.5* 8.7* 9.7* 9.5* 9.7*  HCT 31.8* 27.1* 30.3* 30.0* 29.4*  MCV 83.0 83.4 83.7 82.2 82.4  PLT 455* 354 347 351 406*   Basic Metabolic Panel: Recent Labs  Lab 03/08/22 0301 03/09/22 0243 03/10/22 0415 03/11/22 0401 03/12/22 0202  NA 135 132* 135 134* 136  K 4.4 4.2 3.9 4.0 3.4*  CL 100 98 104 102 100  CO2 22 27 27 22 26   GLUCOSE 203* 133* 90 235* 262*  BUN 18 11 11 8 9   CREATININE 0.99 0.84 0.62 0.64 0.62  CALCIUM 9.2 8.4* 8.6* 8.6* 8.5*  MG  --   --  1.6* 2.0 1.9  PHOS  --   --   --   --  2.4*   GFR: Estimated Creatinine Clearance: 57.4 mL/min (by C-G formula based on SCr of 0.62 mg/dL). Liver Function Tests: Recent Labs  Lab 03/08/22 0301   AST 24  ALT 17  ALKPHOS 90  BILITOT 0.5  PROT 7.6  ALBUMIN 2.5*   Recent Labs  Lab 03/08/22 0301  LIPASE 29   No results for input(s): "AMMONIA" in the last 168 hours. Coagulation Profile: No results for input(s): "INR", "PROTIME" in the last 168 hours. Cardiac Enzymes: No results for input(s): "CKTOTAL", "CKMB", "CKMBINDEX", "TROPONINI" in the last 168 hours. BNP (last 3 results) No results for input(s): "PROBNP" in the last 8760 hours. HbA1C: No results for input(s): "HGBA1C" in the last 72 hours. CBG: Recent Labs  Lab 03/11/22 1212 03/11/22 1625 03/11/22 2316 03/12/22 0751 03/12/22 1138  GLUCAP 150* 123* 156* 201* 230*   Lipid Profile: No results for input(s): "CHOL", "HDL", "LDLCALC", "TRIG", "CHOLHDL", "LDLDIRECT" in the last 72 hours. Thyroid Function Tests: No results for input(s): "TSH", "T4TOTAL", "FREET4", "T3FREE", "THYROIDAB" in the last 72 hours. Anemia Panel: No results for input(s): "VITAMINB12", "  FOLATE", "FERRITIN", "TIBC", "IRON", "RETICCTPCT" in the last 72 hours. Sepsis Labs: Recent Labs  Lab 03/08/22 0301  LATICACIDVEN 1.3    Recent Results (from the past 240 hour(s))  Urine Culture     Status: Abnormal   Collection Time: 03/08/22  2:56 AM   Specimen: Urine, Catheterized  Result Value Ref Range Status   Specimen Description URINE, CATHETERIZED  Final   Special Requests   Final    NONE Performed at [facility], [address] [place], [place] [address]    Culture (A)  Final    >=100,000 COLONIES/mL ESCHERICHIA COLI Confirmed Extended Spectrum Beta-Lactamase Producer (ESBL).  In bloodstream infections from ESBL organisms, carbapenems are preferred over piperacillin/tazobactam. They are shown to have a lower risk of mortality.    Report Status 03/10/2022 FINAL  Final   Organism ID, Bacteria ESCHERICHIA COLI (A)  Final      Susceptibility   Escherichia coli - MIC*    AMPICILLIN >=32 RESISTANT Resistant     CEFAZOLIN >=64  RESISTANT Resistant     CEFEPIME 16 RESISTANT Resistant     CEFTRIAXONE >=64 RESISTANT Resistant     CIPROFLOXACIN >=4 RESISTANT Resistant     GENTAMICIN <=1 SENSITIVE Sensitive     IMIPENEM <=0.25 SENSITIVE Sensitive     NITROFURANTOIN <=16 SENSITIVE Sensitive     TRIMETH/SULFA >=320 RESISTANT Resistant     AMPICILLIN/SULBACTAM >=32 RESISTANT Resistant     PIP/TAZO 64 INTERMEDIATE Intermediate     * >=100,000 COLONIES/mL ESCHERICHIA COLI  Culture, blood (routine x 2)     Status: None (Preliminary result)   Collection Time: 03/08/22  6:18 AM   Specimen: BLOOD LEFT ARM  Result Value Ref Range Status   Specimen Description BLOOD LEFT ARM  Final   Special Requests   Final    BOTTLES DRAWN AEROBIC AND ANAEROBIC Blood Culture results may not be optimal due to an inadequate volume of blood received in culture bottles   Culture   Final    NO GROWTH 4 DAYS Performed at [facility], [address] [place], [place] [address]    Report Status PENDING  Incomplete  Culture, blood (routine x 2)     Status: None (Preliminary result)   Collection Time: 03/08/22  6:18 AM   Specimen: BLOOD RIGHT ARM  Result Value Ref Range Status   Specimen Description BLOOD RIGHT ARM  Final   Special Requests   Final    BOTTLES DRAWN AEROBIC AND ANAEROBIC Blood Culture results may not be optimal due to an inadequate volume of blood received in culture bottles   Culture   Final    NO GROWTH 4 DAYS Performed at [facility], [address] [place], [place] [address]    Report Status PENDING  Incomplete  MRSA Next Gen by PCR, Nasal     Status: Abnormal   Collection Time: 03/08/22  5:58 PM   Specimen: Nasal Mucosa; Nasal Swab  Result Value Ref Range Status   MRSA by PCR Next Gen DETECTED (A) NOT DETECTED Final    Comment: RESULT CALLED TO, READ BACK BY AND VERIFIED WITH: RN Catalina PizzaP. MARTIN [phone] @[phone]  FH (NOTE) The GeneXpert MRSA Assay (FDA approved for NASAL specimens only), is one component of a  comprehensive MRSA colonization surveillance program. It is not intended to diagnose MRSA infection nor to guide or monitor treatment for MRSA infections. Test performance is not FDA approved in patients less than 59 years old. Performed at  [facility], [address], [place], [place] [address]   Body fluid culture w Gram Stain     Status: None (Preliminary result)   Collection Time: 03/10/22  7:57 AM   Specimen: Other Source; Body Fluid  Result Value Ref Range Status   Specimen Description OTHER  Final   Special Requests  RIGHT HIP  Final   Gram Stain   Final    RARE WBC PRESENT, PREDOMINANTLY PMN NO ORGANISMS SEEN    Culture   Final    NO GROWTH 2 DAYS Performed at [facility], [address] [place], [place] [address]    Report Status PENDING  Incomplete         Radiology Studies: MR Lumbar Spine W Wo Contrast  Result Date: 03/11/2022 CLINICAL DATA:  Low back pain, infection suspected EXAM: MRI LUMBAR SPINE WITHOUT AND WITH CONTRAST TECHNIQUE: Multiplanar and multiecho pulse sequences of the lumbar spine were obtained without and with intravenous contrast. CONTRAST:  33mL GADAVIST GADOBUTROL 1 MMOL/ML IV SOLN COMPARISON:  Lumbar spine MRI 04/12/2021 CT abdomen pelvis 08/19/2021 FINDINGS: Segmentation:  Standard Alignment:  Normal Vertebrae: Superior endplate Schmorl's nodes at L3 and L5 present on CT abdomen pelvis 08/19/2021. No acute abnormality. No discitis-osteomyelitis. Conus medullaris and cauda equina: Conus extends to the L1 level. Conus and cauda equina appear normal. Paraspinal and other soft tissues: Large amount of stool in the colon. Disc levels: T12-L1: Normal. L1-L2: Normal disc space and facet joints. No spinal canal stenosis. No neural foraminal stenosis. L2-L3: Normal disc space and facet joints. No spinal canal stenosis. No neural foraminal stenosis. L3-L4: Normal disc space and facet joints. No spinal canal stenosis. No neural foraminal stenosis.  L4-L5: Small disc bulge. Normal facets. No spinal canal stenosis. Mild left neural foraminal stenosis. L5-S1: Normal disc space and facet joints. No spinal canal stenosis. No neural foraminal stenosis. Visualized sacrum: Normal. IMPRESSION: 1. No discitis-osteomyelitis or epidural abscess. 2. Mild left L4-5 neural foraminal stenosis. 3. Large amount of stool in the colon. Correlate for symptoms of constipation. Electronically Signed   By: Deatra Robinson M.D.   On: 03/11/2022 00:38        Scheduled Meds:  aspirin EC  81 mg Oral Daily   Chlorhexidine Gluconate Cloth  6 each Topical Q0600   docusate sodium  100 mg Oral BID   dronabinol  5 mg Oral BID AC   enoxaparin (LOVENOX) injection  20 mg Subcutaneous Q24H   ezetimibe  10 mg Oral Daily   feeding supplement (GLUCERNA SHAKE)  237 mL Oral TID WC   insulin aspart  0-9 Units Subcutaneous TID WC   [START ON 03/13/2022] insulin glargine-yfgn  22 Units Subcutaneous Daily   magnesium oxide  400 mg Oral Daily   midodrine  10 mg Oral TID WC   multivitamin with minerals  1 tablet Oral Daily   mupirocin ointment  1 Application Nasal BID   oxybutynin  5 mg Oral BID   pantoprazole  40 mg Oral QAC breakfast   polyethylene glycol  17 g Oral Daily   rosuvastatin  20 mg Oral Daily   thiamine  100 mg Oral Daily   Continuous Infusions:  meropenem (MERREM) IV 1 g (03/12/22 0423)     LOS: 3 days   Time spent= 35 mins    Huey Bienenstock, MD [facility]  If 7PM-7AM, please contact night-coverage  03/12/2022, 12:47 PM

## 2022-03-12 NOTE — Progress Notes (Signed)
Pharmacy Antibiotic Note  Charles [name] is a 61 y.o. male admitted on 03/08/2022 with UTI . Pharmacy has been consulted for Meropenem dosing.  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 Cefepime 1gm IV given in ED on 12/27, then changed to Meropenem. Chronic indwelling foley, exchanged 12/27.   12/31: Patient continues meropenem on approximately day 4 for pyelonephritis 2/2 prior obstruction  /  right hip osteonecrosis. ID is following. Blood cultures remain negative and MRI shows no evidence of infection in the lumbar spine. No organisms seen so far on right hip aspirate. Patient is afebrile today and his white blood cell count is down from a peak of 28.1k to 11.4k today. His Scr is stable around 0.6.   Plan: Continue Meropenem 1gm IV q8hrs Follow renal function, culture data, clinical progress and antibiotic plans. ID expecting 10-day course.  Height: 5\' 6"  (167.6 cm) Weight: 40.8 kg (90 lb) IBW/kg (Calculated) : 63.8  Temp (24hrs), Avg:97.9 F (36.6 C), Min:97.5 F (36.4 C), Max:98.6 F (37 C)  Recent Labs  Lab 03/08/22 0301 03/09/22 0243 03/10/22 0415 03/11/22 0401 03/12/22 0202  WBC 15.1* 28.1* 19.0* 12.3* 11.4*  CREATININE 0.99 0.84 0.62 0.64 0.62  LATICACIDVEN 1.3  --   --   --   --      Estimated Creatinine Clearance: 57.4 mL/min (by C-G formula based on SCr of 0.62 mg/dL).    No Known Allergies  Antimicrobials this admission: Cefepime x 1 on 12/27 Meropenem 12/27 >> Mupirocin/CHG 12/29>>(1/2)  Dose adjustments this admission: 12/29: Meropenem q12 > q8h for improved renal funcion  Microbiology results: 12/27 blood: no growth x 2 days to date 12/27 urine: >100K/ml E coli - ESBL, sensitive to Imipenem 12/27 MRSA PCR: detected 12/29 R hip aspiraton: NG x 2 days   Thank you for allowing pharmacy to be a part of this patient's care.  1/30, PharmD  PGY1 Pharmacy Resident

## 2022-03-13 DIAGNOSIS — N390 Urinary tract infection, site not specified: Secondary | ICD-10-CM | POA: Diagnosis not present

## 2022-03-13 DIAGNOSIS — S7292XS Unspecified fracture of left femur, sequela: Secondary | ICD-10-CM

## 2022-03-13 LAB — BODY FLUID CULTURE W GRAM STAIN: Culture: NO GROWTH

## 2022-03-13 LAB — CULTURE, BLOOD (ROUTINE X 2)
Culture: NO GROWTH
Culture: NO GROWTH

## 2022-03-13 LAB — MAGNESIUM: Magnesium: 1.9 mg/dL (ref 1.7–2.4)

## 2022-03-13 LAB — BASIC METABOLIC PANEL
Anion gap: 8 (ref 5–15)
BUN: 5 mg/dL — ABNORMAL LOW (ref 6–20)
CO2: 25 mmol/L (ref 22–32)
Calcium: 8.6 mg/dL — ABNORMAL LOW (ref 8.9–10.3)
Chloride: 101 mmol/L (ref 98–111)
Creatinine, Ser: 0.58 mg/dL — ABNORMAL LOW (ref 0.61–1.24)
GFR, Estimated: >60 mL/min (ref >60)
Glucose, Bld: 244 mg/dL — ABNORMAL HIGH (ref 70–99)
Potassium: 4 mmol/L (ref 3.5–5.1)
Sodium: 134 mmol/L — ABNORMAL LOW (ref 135–145)

## 2022-03-13 LAB — GLUCOSE, CAPILLARY
Glucose-Capillary: 160 mg/dL — ABNORMAL HIGH (ref 70–99)
Glucose-Capillary: 142 mg/dL — ABNORMAL HIGH (ref 70–99)
Glucose-Capillary: 95 mg/dL (ref 70–99)
Glucose-Capillary: 160 mg/dL — ABNORMAL HIGH (ref 70–99)

## 2022-03-13 LAB — CBC
HCT: 28.8 % — ABNORMAL LOW (ref 39.0–52.0)
Hemoglobin: 9.5 g/dL — ABNORMAL LOW (ref 13.0–17.0)
MCH: 26.9 pg (ref 26.0–34.0)
MCHC: 33 g/dL (ref 30.0–36.0)
MCV: 81.6 fL (ref 80.0–100.0)
Platelets: 400 10*3/uL (ref 150–400)
RBC: 3.53 MIL/uL — ABNORMAL LOW (ref 4.22–5.81)
RDW: 14.7 % (ref 11.5–15.5)
WBC: 12.1 10*3/uL — ABNORMAL HIGH (ref 4.0–10.5)
nRBC: 0 % (ref 0.0–0.2)

## 2022-03-13 MED ORDER — K PHOS MONO-SOD PHOS DI & MONO 155-852-130 MG PO TABS
250.0000 mg | ORAL_TABLET | Freq: Two times a day (BID) | ORAL | Status: DC
  Administered 2022-03-13 – 2022-03-15 (×5): 250 mg via ORAL
  Filled 2022-03-13 (×5): qty 1

## 2022-03-13 NOTE — Progress Notes (Signed)
[facility]) [facility] Liaison Note   [facility] liaison following for discharge disposition back to [facility]. [facility] to follow in facility after discahrge.    Please don't hesitate to call for any hospice related questions or concerns.    Thank you,   Nicholaus Corolla, RN Hospice hospital liaison [phone]

## 2022-03-13 NOTE — Progress Notes (Signed)
Physical Therapy Treatment Patient Details Name: Charles [name] MRN: [id number] DOB: February 18, 1961 Today's Date: 03/13/2022   History of Present Illness 62 yo male presents to [facility] on 40/98 with complicated UTI. Pt also with R hip fx, present on CT scan as far back as 08/2021, s/p aspiration showing no infection. PMH significant of insulin-dependent type 2 diabetes, hypotension on midodrine, CAD status post CABG, BPH/ bladder outflow obstruction with chronic indwelling Foley catheter, hyperlipidemia, GERD, cognitive decline/ ?dementia, severe malnutrition    PT Comments    Initially pt agreeable to attempt transfer OOB, once session started pt became reluctant and agreeable to bed-level only. Pt soiled in stool, requires min assist for rolling bilat for clean up and very fearful of falling even with rolling, PT providing total care for cleaning up BM. Pt tolerated RLE stretches and exercise well. PT to continue to follow.     Recommendations for follow up therapy are one component of a multi-disciplinary discharge planning process, led by the attending physician.  Recommendations may be updated based on patient status, additional functional criteria and insurance authorization.  Follow Up Recommendations  Skilled nursing-short term rehab (<3 hours/day)     Assistance Recommended at Discharge Frequent or constant Supervision/Assistance  Patient can return home with the following A lot of help with walking and/or transfers;A lot of help with bathing/dressing/bathroom;Assistance with cooking/housework;Direct supervision/assist for medications management;Direct supervision/assist for financial management;Assist for transportation   Equipment Recommendations  None recommended by PT    Recommendations for Other Services       Precautions / Restrictions Precautions Precautions: Fall Restrictions RLE Weight Bearing: Weight bearing as tolerated LLE Weight Bearing: Weight bearing as tolerated      Mobility  Bed Mobility Overal bed mobility: Needs Assistance Bed Mobility: Rolling Rolling: Min assist         General bed mobility comments: assist for completion of truncal translation, total care for cleaning up stool    Transfers                   General transfer comment: pt declines attempt    Ambulation/Gait                   Stairs             Wheelchair Mobility    Modified Rankin (Stroke Patients Only)       Balance Overall balance assessment: Needs assistance Sitting-balance support: Bilateral upper extremity supported, Feet unsupported Sitting balance-Leahy Scale: Fair   Postural control: Posterior lean                                  Cognition Arousal/Alertness: Awake/alert Behavior During Therapy: WFL for tasks assessed/performed Overall Cognitive Status: Within Functional Limits for tasks assessed                                          Exercises Other Exercises Other Exercises: R hip and knee extension prolonged stretch, with x15 quad sets to emphasize extension    General Comments General comments (skin integrity, edema, etc.): no sacral pad donned due to frequent stooling per RN, pt with areas of soreness and previous sores on buttocks, RN aware      Pertinent Vitals/Pain Pain Assessment Pain Assessment: Faces Faces Pain Scale: Hurts little more Pain Location: RLE during  hip and knee extension exercise Pain Descriptors / Indicators: Sore, Discomfort Pain Intervention(s): Monitored during session, Repositioned, Limited activity within patient's tolerance    Home Living                          Prior Function            PT Goals (current goals can now be found in the care plan section) Acute Rehab PT Goals PT Goal Formulation: With patient Time For Goal Achievement: 03/22/22 Potential to Achieve Goals: Fair Progress towards PT goals: Progressing toward goals     Frequency    Min 2X/week      PT Plan Current plan remains appropriate    Co-evaluation              AM-PAC PT "6 Clicks" Mobility   Outcome Measure  Help needed turning from your back to your side while in a flat bed without using bedrails?: A Little Help needed moving from lying on your back to sitting on the side of a flat bed without using bedrails?: Total Help needed moving to and from a bed to a chair (including a wheelchair)?: Total Help needed standing up from a chair using your arms (e.g., wheelchair or bedside chair)?: Total Help needed to walk in hospital room?: Total Help needed climbing 3-5 steps with a railing? : Total 6 Click Score: 8    End of Session   Activity Tolerance: Patient limited by fatigue Patient left: in bed;with call bell/phone within reach;with bed alarm set Nurse Communication: Mobility status PT Visit Diagnosis: Other abnormalities of gait and mobility (R26.89);Muscle weakness (generalized) (M62.81)     Time: 8676-1950 PT Time Calculation (min) (ACUTE ONLY): 21 min  Charges:  $Therapeutic Activity: 8-22 mins                     Stacie Glaze, PT DPT Acute Rehabilitation Services Pager [phone]  Office [phone]    Roxine Caddy E Ruffin Pyo 03/13/2022, 5:31 PM

## 2022-03-13 NOTE — Progress Notes (Signed)
PROGRESS NOTE    Charles [name]  ZOX:[id number] DOB: 05-26-1960 DOA: 03/08/2022 PCP: Pcp, No   Brief Narrative:   62 year old with history of progressive neurologic decline, DM 2, chronic urinary retention with indwelling Foley presented to the hospital with abdominal pain.  Patient was also admitted about 6 weeks ago for UTI from Providencia and Enterococcus, treated with ertapenem.  At first she was doing okay but now having symptoms again.  CT scan showed acute pyelonephritis with other multiple abnormal findings.  Due to right hip fracture and fluid collection, Ortho consulted and planned hip/joint aspiration to evaluate for any sort of infection.  Hip aspiration performed on 12/29 which does not show any obvious evidence of infection.   Assessment & Plan:  Principal Problem:   Complicated UTI (urinary tract infection) Active Problems:   DM2 (diabetes mellitus, type 2) (HCC)   HLD (hyperlipidemia)   Pressure injury of skin   Chronic orthostatic hypotension   Physical deconditioning   Progressive neurologic decline   UTI due to extended-spectrum beta lactamase (ESBL) producing Escherichia coli   Pyelonephritis   Closed fracture of left femur (HCC)      Complicated UTI, previous history of Providencia, ESBL and Enterococcus. Acute pyelonephritis, right-sided -Patient has chronic indwelling Foley catheter.  This was exchanged in the ER and relieved his urinary retention.   -Antibiotic management per ID, he remains on meropenem . -Urine culture growing ESBL again    Right hip fracture -Appears that this was present on CT scan back in June 2023.  No follow-up or mention of that since.  -Orthopedic and bents been consulted as well due to presence of fluid collection, this post aspiration by IR, limited amount of fluid aspirated, sent for stain and culture, so far remains negative . -Eventually will need hip replacement, have discussed with orthopedic, he will need total joint  surgeon as an outpatient to discuss possible total hip replacement, wont happen during this hospitalization, especially with concern of fluid collection and infection at this point .orthopedic to follow for final recommendations . -Patient can be weight-bear drink as tolerated and to mobilize with PT as discussed with orthopedic.    Progressive neurological decline Chronic urinary retention -Patient is a young person with severe neurologic progression that has been thoroughly investigated without apparent cause -He has had Foley in place since April 2023, changed during this admission -PT/OT eventually -Continue Marinol, tizanidine   DM 2 Recent A1c 7.7.  On long-acting and sliding scale   HLD -Continue rosuvastatin, Zetia   Chronic hypotension -Continue midodrine   Pressure ulcer, sacral stage II.  PTA -Supportive care  Abnormal CT scan finding - Chronic subcapital right femoral fracture previously seen in June 2023.  Has increasing lucencies of that area concerning for osteonecrosis versus infection, management as mentioned above.  Continue supportive care - Has chronic anterior wedge compression fracture of thoracic and lumbar vertebrae.  Pain control - Constipation.  Bowel regimen.  Will also give lactulose today - Mesenteric haziness/ascites.  Likely from malnutrition - Chronic calcified pancreatitis.  Supportive care   DNR -I have discussed code status with the patient and he would prefer to die a natural death should that situation arise. -he will need a gold out of facility DNR form at the time of discharge    Hypomagnesemia Hypophosphatemia -- repleted    DVT prophylaxis: Lovenox Code Status: DNR Family Communication:  none at ebdside  Ongoing management for ESBL and evaluation for right hip fracture  Signs/Symptoms: percent  weight loss, severe muscle depletion, severe fat depletion Percent weight loss: 14 % (in 6 months)  Interventions: Glucerna shake, Magic  cup  Body mass index is 14.53 kg/m.  Pressure Injury 03/08/22 Sacrum Mid Stage 2 -  Partial thickness loss of dermis presenting as a shallow open injury with a red, pink wound bed without slough. Pink but not open (Active)  03/08/22 1115  Location: Sacrum  Location Orientation: Mid  Staging: Stage 2 -  Partial thickness loss of dermis presenting as a shallow open injury with a red, pink wound bed without slough.  Wound Description (Comments): Pink but not open  Present on Admission: Yes        Subjective:  No significant events overnight,   Examination:   Awake Alert, Oriented X 3, frail, chronically ill appearing, deconditioned  Symmetrical Chest wall movement, Good air movement bilaterally, CTAB RRR,No Gallops,Rubs or new Murmurs, No Parasternal Heave +ve B.Sounds, Abd Soft, No tenderness, No rebound - guarding or rigidity. No Cyanosis, Clubbing or edema, No new Rash or bruise   Foley present   Objective: Vitals:   03/12/22 0425 03/12/22 0748 03/12/22 2043 03/13/22 0337  BP: 137/75 125/73 126/76 103/62  Pulse: 89 78 82 82  Resp: 16  18 18   Temp: 98.6 F (37 C) 97.7 F (36.5 C) (!) 97.5 F (36.4 C) 98.2 F (36.8 C)  TempSrc: Oral Oral Oral Oral  SpO2: 97% 100% 100% 100%  Weight:      Height:        Intake/Output Summary (Last 24 hours) at 03/13/2022 1316 Last data filed at 03/13/2022 0600 Gross per 24 hour  Intake 240 ml  Output 2300 ml  Net -2060 ml   Filed Weights   03/08/22 0247  Weight: 40.8 kg     Data Reviewed:   CBC: Recent Labs  Lab 03/08/22 0301 03/09/22 0243 03/10/22 0415 03/11/22 0401 03/12/22 0202 03/13/22 0213  WBC 15.1* 28.1* 19.0* 12.3* 11.4* 12.1*  NEUTROABS 12.0*  --   --   --   --   --   HGB 10.5* 8.7* 9.7* 9.5* 9.7* 9.5*  HCT 31.8* 27.1* 30.3* 30.0* 29.4* 28.8*  MCV 83.0 83.4 83.7 82.2 82.4 81.6  PLT 455* 354 347 351 406* 400   Basic Metabolic Panel: Recent Labs  Lab 03/09/22 0243 03/10/22 0415 03/11/22 0401  03/12/22 0202 03/13/22 0213  NA 132* 135 134* 136 134*  K 4.2 3.9 4.0 3.4* 4.0  CL 98 104 102 100 101  CO2 27 27 22 26 25   GLUCOSE 133* 90 235* 262* 244*  BUN 11 11 8 9  5*  CREATININE 0.84 0.62 0.64 0.62 0.58*  CALCIUM 8.4* 8.6* 8.6* 8.5* 8.6*  MG  --  1.6* 2.0 1.9 1.9  PHOS  --   --   --  2.4*  --    GFR: Estimated Creatinine Clearance: 57.4 mL/min (A) (by C-G formula based on SCr of 0.58 mg/dL (L)). Liver Function Tests: Recent Labs  Lab 03/08/22 0301  AST 24  ALT 17  ALKPHOS 90  BILITOT 0.5  PROT 7.6  ALBUMIN 2.5*   Recent Labs  Lab 03/08/22 0301  LIPASE 29   No results for input(s): "AMMONIA" in the last 168 hours. Coagulation Profile: No results for input(s): "INR", "PROTIME" in the last 168 hours. Cardiac Enzymes: No results for input(s): "CKTOTAL", "CKMB", "CKMBINDEX", "TROPONINI" in the last 168 hours. BNP (last 3 results) No results for input(s): "PROBNP" in the last 8760 hours. HbA1C: No  results for input(s): "HGBA1C" in the last 72 hours. CBG: Recent Labs  Lab 03/12/22 1138 03/12/22 1754 03/12/22 2044 03/13/22 0811 03/13/22 1203  GLUCAP 230* 161* 179* 160* 142*   Lipid Profile: No results for input(s): "CHOL", "HDL", "LDLCALC", "TRIG", "CHOLHDL", "LDLDIRECT" in the last 72 hours. Thyroid Function Tests: No results for input(s): "TSH", "T4TOTAL", "FREET4", "T3FREE", "THYROIDAB" in the last 72 hours. Anemia Panel: No results for input(s): "VITAMINB12", "FOLATE", "FERRITIN", "TIBC", "IRON", "RETICCTPCT" in the last 72 hours. Sepsis Labs: Recent Labs  Lab 03/08/22 0301  LATICACIDVEN 1.3    Recent Results (from the past 240 hour(s))  Urine Culture     Status: Abnormal   Collection Time: 03/08/22  2:56 AM   Specimen: Urine, Catheterized  Result Value Ref Range Status   Specimen Description URINE, CATHETERIZED  Final   Special Requests   Final    NONE Performed at [facility], [address], [place], [place] [address]    Culture  (A)  Final    >=100,000 COLONIES/mL ESCHERICHIA COLI Confirmed Extended Spectrum Beta-Lactamase Producer (ESBL).  In bloodstream infections from ESBL organisms, carbapenems are preferred over piperacillin/tazobactam. They are shown to have a lower risk of mortality.    Report Status 03/10/2022 FINAL  Final   Organism ID, Bacteria ESCHERICHIA COLI (A)  Final      Susceptibility   Escherichia coli - MIC*    AMPICILLIN >=32 RESISTANT Resistant     CEFAZOLIN >=64 RESISTANT Resistant     CEFEPIME 16 RESISTANT Resistant     CEFTRIAXONE >=64 RESISTANT Resistant     CIPROFLOXACIN >=4 RESISTANT Resistant     GENTAMICIN <=1 SENSITIVE Sensitive     IMIPENEM <=0.25 SENSITIVE Sensitive     NITROFURANTOIN <=16 SENSITIVE Sensitive     TRIMETH/SULFA >=320 RESISTANT Resistant     AMPICILLIN/SULBACTAM >=32 RESISTANT Resistant     PIP/TAZO 64 INTERMEDIATE Intermediate     * >=100,000 COLONIES/mL ESCHERICHIA COLI  Culture, blood (routine x 2)     Status: None   Collection Time: 03/08/22  6:18 AM   Specimen: BLOOD LEFT ARM  Result Value Ref Range Status   Specimen Description BLOOD LEFT ARM  Final   Special Requests   Final    BOTTLES DRAWN AEROBIC AND ANAEROBIC Blood Culture results may not be optimal due to an inadequate volume of blood received in culture bottles   Culture   Final    NO GROWTH 5 DAYS Performed at [facility], [address] [place], [place] [address]    Report Status 03/13/2022 FINAL  Final  Culture, blood (routine x 2)     Status: None   Collection Time: 03/08/22  6:18 AM   Specimen: BLOOD RIGHT ARM  Result Value Ref Range Status   Specimen Description BLOOD RIGHT ARM  Final   Special Requests   Final    BOTTLES DRAWN AEROBIC AND ANAEROBIC Blood Culture results may not be optimal due to an inadequate volume of blood received in culture bottles   Culture   Final    NO GROWTH 5 DAYS Performed at [facility], [address], [place], [place] [address]    Report  Status 03/13/2022 FINAL  Final  MRSA Next Gen by PCR, Nasal     Status: Abnormal   Collection Time: 03/08/22  5:58 PM   Specimen: Nasal Mucosa; Nasal Swab  Result Value Ref Range Status   MRSA by PCR Next Gen DETECTED (A) NOT DETECTED Final  Comment: RESULT CALLED TO, READ BACK BY AND VERIFIED WITH: RN Vilma Prader [phone] @2106  FH (NOTE) The GeneXpert MRSA Assay (FDA approved for NASAL specimens only), is one component of a comprehensive MRSA colonization surveillance program. It is not intended to diagnose MRSA infection nor to guide or monitor treatment for MRSA infections. Test performance is not FDA approved in patients less than 46 years old. Performed at [facility], [address] [place], [place] [address]   Body fluid culture w Gram Stain     Status: None   Collection Time: 03/10/22  7:57 AM   Specimen: Other Source; Body Fluid  Result Value Ref Range Status   Specimen Description OTHER  Final   Special Requests  RIGHT HIP  Final   Gram Stain   Final    RARE WBC PRESENT, PREDOMINANTLY PMN NO ORGANISMS SEEN    Culture   Final    NO GROWTH 3 DAYS Performed at [facility], [address], [place], [place] [address]    Report Status 03/13/2022 FINAL  Final         Radiology Studies: No results found.      Scheduled Meds:  aspirin EC  81 mg Oral Daily   Chlorhexidine Gluconate Cloth  6 each Topical Q0600   docusate sodium  100 mg Oral BID   dronabinol  5 mg Oral BID AC   enoxaparin (LOVENOX) injection  20 mg Subcutaneous Q24H   ezetimibe  10 mg Oral Daily   feeding supplement (GLUCERNA SHAKE)  237 mL Oral TID WC   insulin aspart  0-9 Units Subcutaneous TID WC   insulin glargine-yfgn  22 Units Subcutaneous Daily   magnesium oxide  400 mg Oral Daily   midodrine  10 mg Oral TID WC   multivitamin with minerals  1 tablet Oral Daily   mupirocin ointment  1 Application Nasal BID   oxybutynin  5 mg Oral BID   pantoprazole  40 mg Oral QAC breakfast    phosphorus  250 mg Oral BID   polyethylene glycol  17 g Oral BID   rosuvastatin  20 mg Oral Daily   thiamine  100 mg Oral Daily   Continuous Infusions:  meropenem (MERREM) IV 1 g (03/13/22 0332)     LOS: 4 days      Phillips Climes, MD [facility]  If 7PM-7AM, please contact night-coverage  03/13/2022, 1:16 PM

## 2022-03-14 DIAGNOSIS — Z1612 Extended spectrum beta lactamase (ESBL) resistance: Secondary | ICD-10-CM

## 2022-03-14 DIAGNOSIS — M009 Pyogenic arthritis, unspecified: Secondary | ICD-10-CM | POA: Diagnosis not present

## 2022-03-14 DIAGNOSIS — N390 Urinary tract infection, site not specified: Secondary | ICD-10-CM | POA: Diagnosis not present

## 2022-03-14 DIAGNOSIS — S7292XS Unspecified fracture of left femur, sequela: Secondary | ICD-10-CM | POA: Diagnosis not present

## 2022-03-14 DIAGNOSIS — N12 Tubulo-interstitial nephritis, not specified as acute or chronic: Secondary | ICD-10-CM

## 2022-03-14 DIAGNOSIS — S7292XD Unspecified fracture of left femur, subsequent encounter for closed fracture with routine healing: Secondary | ICD-10-CM

## 2022-03-14 DIAGNOSIS — B9629 Other Escherichia coli [E. coli] as the cause of diseases classified elsewhere: Secondary | ICD-10-CM

## 2022-03-14 LAB — CBC
HCT: 31.1 % — ABNORMAL LOW (ref 39.0–52.0)
Hemoglobin: 10.4 g/dL — ABNORMAL LOW (ref 13.0–17.0)
MCH: 27.2 pg (ref 26.0–34.0)
MCHC: 33.4 g/dL (ref 30.0–36.0)
MCV: 81.2 fL (ref 80.0–100.0)
Platelets: 396 10*3/uL (ref 150–400)
RBC: 3.83 MIL/uL — ABNORMAL LOW (ref 4.22–5.81)
RDW: 14.8 % (ref 11.5–15.5)
WBC: 18.6 10*3/uL — ABNORMAL HIGH (ref 4.0–10.5)
nRBC: 0 % (ref 0.0–0.2)

## 2022-03-14 LAB — GLUCOSE, CAPILLARY
Glucose-Capillary: 81 mg/dL (ref 70–99)
Glucose-Capillary: 80 mg/dL (ref 70–99)
Glucose-Capillary: 212 mg/dL — ABNORMAL HIGH (ref 70–99)
Glucose-Capillary: 158 mg/dL — ABNORMAL HIGH (ref 70–99)

## 2022-03-14 LAB — MAGNESIUM: Magnesium: 2 mg/dL (ref 1.7–2.4)

## 2022-03-14 LAB — BASIC METABOLIC PANEL
Anion gap: 11 (ref 5–15)
BUN: 11 mg/dL (ref 6–20)
CO2: 26 mmol/L (ref 22–32)
Calcium: 9 mg/dL (ref 8.9–10.3)
Chloride: 98 mmol/L (ref 98–111)
Creatinine, Ser: 0.61 mg/dL (ref 0.61–1.24)
GFR, Estimated: >60 mL/min (ref >60)
Glucose, Bld: 121 mg/dL — ABNORMAL HIGH (ref 70–99)
Potassium: 3.7 mmol/L (ref 3.5–5.1)
Sodium: 135 mmol/L (ref 135–145)

## 2022-03-14 NOTE — Progress Notes (Signed)
Occupational Therapy Treatment Patient Details Name: Charles [name] MRN: [id number] DOB: 12-26-1960 Today's Date: 03/14/2022   History of present illness 62 yo male presents to [facility] on 97/02 with complicated UTI. Pt also with R hip fx, present on CT scan as far back as 08/2021, s/p aspiration showing no infection. PMH significant of insulin-dependent type 2 diabetes, hypotension on midodrine, CAD status post CABG, BPH/ bladder outflow obstruction with chronic indwelling Foley catheter, hyperlipidemia, GERD, cognitive decline/ ?dementia, severe malnutrition   OT comments  Patient with incremental progress toward patient focused goals.  Deficits listed below remain, limiting progression.  Patient continues to need quite a bit of support for basic mobility and ADL support at bedlevel.  Patient continues to express wanting to return home, but has no social support system in the area.  He is unable to straighten his legs, and is not able to bear weight enough to functionally assist with transfers.  Patient needing Max A to total assist for peri care.  OT can continue efforts in the acute setting to assist with transfer to SNF level for continued rehab trial.  Patient would need 24 hour near total care at home, and he does not have this level of support.     Recommendations for follow up therapy are one component of a multi-disciplinary discharge planning process, led by the attending physician.  Recommendations may be updated based on patient status, additional functional criteria and insurance authorization.    Follow Up Recommendations  Skilled nursing-short term rehab (<3 hours/day)     Assistance Recommended at Discharge Frequent or constant Supervision/Assistance  Patient can return home with the following  Help with stairs or ramp for entrance;Assist for transportation;A lot of help with walking and/or transfers;A lot of help with bathing/dressing/bathroom   Equipment Recommendations  None  recommended by OT    Recommendations for Other Services      Precautions / Restrictions Precautions Precautions: Fall Restrictions Weight Bearing Restrictions: No       Mobility Bed Mobility Overal bed mobility: Needs Assistance Bed Mobility: Rolling Rolling: Min assist   Supine to sit: Mod assist Sit to supine: Mod assist        Transfers                         Balance Overall balance assessment: Needs assistance Sitting-balance support: Bilateral upper extremity supported, Feet unsupported Sitting balance-Leahy Scale: Fair   Postural control: Posterior lean                                 ADL either performed or assessed with clinical judgement   ADL               Lower Body Bathing: Maximal assistance;Bed level       Lower Body Dressing: Moderate assistance;Maximal assistance;Bed level       Toileting- Clothing Manipulation and Hygiene: Total assistance;Bed level              Extremity/Trunk Assessment Upper Extremity Assessment Upper Extremity Assessment: Generalized weakness   Lower Extremity Assessment Lower Extremity Assessment: Defer to PT evaluation                          Cognition Arousal/Alertness: Awake/alert Behavior During Therapy: WFL for tasks assessed/performed Overall Cognitive Status: Within Functional Limits for tasks assessed Area of Impairment: Memory  Memory: Decreased short-term memory                               General Comments  VSS on RA    Pertinent Vitals/ Pain       Pain Assessment Pain Assessment: Faces Faces Pain Scale: Hurts little more Pain Location: RLE during hip and knee extension exercise Pain Descriptors / Indicators: Discomfort, Grimacing Pain Intervention(s): Monitored during session                                                          Frequency  Min 2X/week        Progress  Toward Goals  OT Goals(current goals can now be found in the care plan section)  Progress towards OT goals: Progressing toward goals  Acute Rehab OT Goals OT Goal Formulation: With patient Time For Goal Achievement: 03/23/22 Potential to Achieve Goals: Fair ADL Goals Pt Will Transfer to Toilet: with mod assist;squat pivot transfer;bedside commode  Plan Discharge plan remains appropriate    Co-evaluation                 AM-PAC OT "6 Clicks" Daily Activity     Outcome Measure   Help from another person eating meals?: None Help from another person taking care of personal grooming?: None Help from another person toileting, which includes using toliet, bedpan, or urinal?: A Lot Help from another person bathing (including washing, rinsing, drying)?: A Lot Help from another person to put on and taking off regular upper body clothing?: A Lot Help from another person to put on and taking off regular lower body clothing?: A Lot 6 Click Score: 16    End of Session Equipment Utilized During Treatment: Gait belt  OT Visit Diagnosis: Unsteadiness on feet (R26.81);Muscle weakness (generalized) (M62.81);Pain Pain - part of body: Leg   Activity Tolerance No increased pain   Patient Left with call bell/phone within reach;in bed;with bed alarm set   Nurse Communication Mobility status        Time: 9562-1308 OT Time Calculation (min): 12 min  Charges: OT General Charges $OT Visit: 1 Visit OT Treatments $Self Care/Home Management : 8-22 mins  03/14/2022  RP, OTR/L  Acute Rehabilitation Services  Office:  [place] 03/14/2022, 10:44 AM

## 2022-03-14 NOTE — Plan of Care (Signed)

## 2022-03-14 NOTE — Progress Notes (Signed)
PROGRESS NOTE    Charles [name]  [id number] DOB: [birth year] DOA: 03/08/2022 PCP: Pcp, No   Brief Narrative:   62 year old with history of progressive neurologic decline, DM 2, chronic urinary retention with indwelling Foley presented to the hospital with abdominal pain.  Patient was also admitted about 6 weeks ago for UTI from Providencia and Enterococcus, treated with ertapenem.  At first she was doing okay but now having symptoms again.  CT scan showed acute pyelonephritis with other multiple abnormal findings.  Due to right hip fracture and fluid collection, Ortho consulted and planned hip/joint aspiration to evaluate for any sort of infection.  Hip aspiration performed on 12/29 which does not show any obvious evidence of infection.   Assessment & Plan:  Principal Problem:   Complicated UTI (urinary tract infection) Active Problems:   DM2 (diabetes mellitus, type 2) (HCC)   HLD (hyperlipidemia)   Pressure injury of skin   Chronic orthostatic hypotension   Physical deconditioning   Progressive neurologic decline   UTI due to extended-spectrum beta lactamase (ESBL) producing Escherichia coli   Pyelonephritis   Closed fracture of left femur (HCC)      Complicated UTI, previous history of Providencia, ESBL and Enterococcus. Acute pyelonephritis, right-sided -Patient has chronic indwelling Foley catheter.  This was exchanged in the ER and relieved his urinary retention.   -Antibiotic management per ID, he remains on meropenem . -Urine culture growing ESBL again    Right hip fracture -Appears that this was present on CT scan back in June 2023.  No follow-up or mention of that since.  -Orthopedic and bents been consulted as well due to presence of fluid collection, this post aspiration by IR, limited amount of fluid aspirated, sent for stain and culture, so far remains negative . -Eventually will need hip replacement, have discussed with orthopedic, sports Ortho, he will need  total joint surgeon as an outpatient to discuss possible total hip replacement, wont happen during this hospitalization, especially with concern of fluid collection and infection at this point .Patient can be weight bearing  as tolerated and to mobilize with PT as discussed with orthopedic.    Progressive neurological decline Chronic urinary retention -Patient is a young person with severe neurologic progression that has been thoroughly investigated without apparent cause -He has had Foley in place since April 2023, changed during this admission -PT/OT eventually -Continue Marinol, tizanidine   DM 2 Recent A1c 7.7.  On long-acting and sliding scale   HLD -Continue rosuvastatin, Zetia   Chronic hypotension -Continue midodrine   Pressure ulcer, sacral stage II.  PTA -Supportive care  Abnormal CT scan finding - Chronic subcapital right femoral fracture previously seen in June 2023.  Has increasing lucencies of that area concerning for osteonecrosis versus infection, management as mentioned above.  Continue supportive care - Has chronic anterior wedge compression fracture of thoracic and lumbar vertebrae.  Pain control - Constipation.  Bowel regimen.  Will also give lactulose today - Mesenteric haziness/ascites.  Likely from malnutrition - Chronic calcified pancreatitis.  Supportive care   DNR -I have discussed code status with the patient and he would prefer to die a natural death should that situation arise. -he will need a gold out of facility DNR form at the time of discharge    Hypomagnesemia Hypophosphatemia -- repleted    DVT prophylaxis: Lovenox Code Status: DNR Family Communication:  none at eb[place]ide  Ongoing management for ESBL and evaluation for right hip fracture  Signs/Symptoms: percent weight loss, severe muscle  depletion, severe fat depletion Percent weight loss: 14 % (in 6 months)  Interventions: Glucerna shake, Magic cup  Body mass index is 14.53  kg/m.  Pressure Injury 03/08/22 Sacrum Mid Stage 2 -  Partial thickness loss of dermis presenting as a shallow open injury with a red, pink wound bed without slough. Pink but not open (Active)  03/08/22 1115  Location: Sacrum  Location Orientation: Mid  Staging: Stage 2 -  Partial thickness loss of dermis presenting as a shallow open injury with a red, pink wound bed without slough.  Wound Description (Comments): Pink but not open  Present on Admission: Yes        Subjective:  No significant events overnight, denies any complaints today   Examination:  Awake Alert, Oriented X 3,, chronically ill-appearing, extremely frail, deconditioned. Symmetrical Chest wall movement, Good air movement bilaterally, CTAB RRR,No Gallops,Rubs or new Murmurs, No Parasternal Heave +ve B.Sounds, Abd Soft, No tenderness, No rebound - guarding or rigidity. No Cyanosis, Clubbing or edema, significant muscle wasting and generalized weakness, please see picture below Foley present     Objective: Vitals:   03/13/22 2006 03/14/22 0500 03/14/22 0526 03/14/22 0812  BP: 114/70 (!) 86/56 117/70 (!) 102/59  Pulse: 79 73  72  Resp: 16 16  18   Temp: 97.6 F (36.4 C) 98.3 F (36.8 C)  97.6 F (36.4 C)  TempSrc: Oral Oral  Oral  SpO2: 100% 100%  100%  Weight:      Height:        Intake/Output Summary (Last 24 hours) at 03/14/2022 1056 Last data filed at 03/14/2022 0211 Gross per 24 hour  Intake --  Output 1050 ml  Net -1050 ml   Filed Weights   03/08/22 0247  Weight: 40.8 kg     Data Reviewed:   CBC: Recent Labs  Lab 03/08/22 0301 03/09/22 0243 03/10/22 0415 03/11/22 0401 03/12/22 0202 03/13/22 0213 03/14/22 0330  WBC 15.1*   < > 19.0* 12.3* 11.4* 12.1* 18.6*  NEUTROABS 12.0*  --   --   --   --   --   --   HGB 10.5*   < > 9.7* 9.5* 9.7* 9.5* 10.4*  HCT 31.8*   < > 30.3* 30.0* 29.4* 28.8* 31.1*  MCV 83.0   < > 83.7 82.2 82.4 81.6 81.2  PLT 455*   < > 347 351 406* 400 396   < > =  values in this interval not displayed.   Basic Metabolic Panel: Recent Labs  Lab 03/10/22 0415 03/11/22 0401 03/12/22 0202 03/13/22 0213 03/14/22 0330  NA 135 134* 136 134* 135  K 3.9 4.0 3.4* 4.0 3.7  CL 104 102 100 101 98  CO2 27 22 26 25 26   GLUCOSE 90 235* 262* 244* 121*  BUN 11 8 9  5* 11  CREATININE 0.62 0.64 0.62 0.58* 0.61  CALCIUM 8.6* 8.6* 8.5* 8.6* 9.0  MG 1.6* 2.0 1.9 1.9 2.0  PHOS  --   --  2.4*  --   --    GFR: Estimated Creatinine Clearance: 57.4 mL/min (by C-G formula based on SCr of 0.61 mg/dL). Liver Function Tests: Recent Labs  Lab 03/08/22 0301  AST 24  ALT 17  ALKPHOS 90  BILITOT 0.5  PROT 7.6  ALBUMIN 2.5*   Recent Labs  Lab 03/08/22 0301  LIPASE 29   No results for input(s): "AMMONIA" in the last 168 hours. Coagulation Profile: No results for input(s): "INR", "PROTIME" in the last 168  hours. Cardiac Enzymes: No results for input(s): "CKTOTAL", "CKMB", "CKMBINDEX", "TROPONINI" in the last 168 hours. BNP (last 3 results) No results for input(s): "PROBNP" in the last 8760 hours. HbA1C: No results for input(s): "HGBA1C" in the last 72 hours. CBG: Recent Labs  Lab 03/13/22 0811 03/13/22 1203 03/13/22 1531 03/13/22 2017 03/14/22 0810  GLUCAP 160* 142* 95 160* 81   Lipid Profile: No results for input(s): "CHOL", "HDL", "LDLCALC", "TRIG", "CHOLHDL", "LDLDIRECT" in the last 72 hours. Thyroid Function Tests: No results for input(s): "TSH", "T4TOTAL", "FREET4", "T3FREE", "THYROIDAB" in the last 72 hours. Anemia Panel: No results for input(s): "VITAMINB12", "FOLATE", "FERRITIN", "TIBC", "IRON", "RETICCTPCT" in the last 72 hours. Sepsis Labs: Recent Labs  Lab 03/08/22 0301  LATICACIDVEN 1.3    Recent Results (from the past 240 hour(s))  Urine Culture     Status: Abnormal   Collection Time: 03/08/22  2:56 AM   Specimen: Urine, Catheterized  Result Value Ref Range Status   Specimen Description URINE, CATHETERIZED  Final   Special  Requests   Final    NONE Performed at [facility], [address], [place], [place] [address]    Culture (A)  Final    >=100,000 COLONIES/mL ESCHERICHIA COLI Confirmed Extended Spectrum Beta-Lactamase Producer (ESBL).  In bloodstream infections from ESBL organisms, carbapenems are preferred over piperacillin/tazobactam. They are shown to have a lower risk of mortality.    Report Status 03/10/2022 FINAL  Final   Organism ID, Bacteria ESCHERICHIA COLI (A)  Final      Susceptibility   Escherichia coli - MIC*    AMPICILLIN >=32 RESISTANT Resistant     CEFAZOLIN >=64 RESISTANT Resistant     CEFEPIME 16 RESISTANT Resistant     CEFTRIAXONE >=64 RESISTANT Resistant     CIPROFLOXACIN >=4 RESISTANT Resistant     GENTAMICIN <=1 SENSITIVE Sensitive     IMIPENEM <=0.25 SENSITIVE Sensitive     NITROFURANTOIN <=16 SENSITIVE Sensitive     TRIMETH/SULFA >=320 RESISTANT Resistant     AMPICILLIN/SULBACTAM >=32 RESISTANT Resistant     PIP/TAZO 64 INTERMEDIATE Intermediate     * >=100,000 COLONIES/mL ESCHERICHIA COLI  Culture, blood (routine x 2)     Status: None   Collection Time: 03/08/22  6:18 AM   Specimen: BLOOD LEFT ARM  Result Value Ref Range Status   Specimen Description BLOOD LEFT ARM  Final   Special Requests   Final    BOTTLES DRAWN AEROBIC AND ANAEROBIC Blood Culture results may not be optimal due to an inadequate volume of blood received in culture bottles   Culture   Final    NO GROWTH 5 DAYS Performed at [facility], [address], [place], [place] [address]    Report Status 03/13/2022 FINAL  Final  Culture, blood (routine x 2)     Status: None   Collection Time: 03/08/22  6:18 AM   Specimen: BLOOD RIGHT ARM  Result Value Ref Range Status   Specimen Description BLOOD RIGHT ARM  Final   Special Requests   Final    BOTTLES DRAWN AEROBIC AND ANAEROBIC Blood Culture results may not be optimal due to an inadequate volume of blood received in culture bottles   Culture    Final    NO GROWTH 5 DAYS Performed at [facility], [place] [address] [place], [place] [address]    Report Status 03/13/2022 FINAL  Final  MRSA Next Gen by PCR, Nasal     Status: Abnormal  Collection Time: 03/08/22  5:58 PM   Specimen: Nasal Mucosa; Nasal Swab  Result Value Ref Range Status   MRSA by PCR Next Gen DETECTED (A) NOT DETECTED Final    Comment: RESULT CALLED TO, READ BACK BY AND VERIFIED WITH: RN Vilma Prader [phone] @[phone]  FH (NOTE) The GeneXpert MRSA Assay (FDA approved for NASAL specimens only), is one component of a comprehensive MRSA colonization surveillance program. It is not intended to diagnose MRSA infection nor to guide or monitor treatment for MRSA infections. Test performance is not FDA approved in patients less than 36 years old. Performed at [facility], [place] [address] [place], [place] [address]   Body fluid culture w Gram Stain     Status: None   Collection Time: 03/10/22  7:57 AM   Specimen: Other Source; Body Fluid  Result Value Ref Range Status   Specimen Description OTHER  Final   Special Requests  RIGHT HIP  Final   Gram Stain   Final    RARE WBC PRESENT, PREDOMINANTLY PMN NO ORGANISMS SEEN    Culture   Final    NO GROWTH 3 DAYS Performed at [facility], [address], [place], [place] [address]    Report Status 03/13/2022 FINAL  Final         Radiology Studies: No results found.      Scheduled Meds:  aspirin EC  81 mg Oral Daily   docusate sodium  100 mg Oral BID   dronabinol  5 mg Oral BID AC   enoxaparin (LOVENOX) injection  20 mg Subcutaneous Q24H   ezetimibe  10 mg Oral Daily   feeding supplement (GLUCERNA SHAKE)  237 mL Oral TID WC   insulin aspart  0-9 Units Subcutaneous TID WC   insulin glargine-yfgn  22 Units Subcutaneous Daily   magnesium oxide  400 mg Oral Daily   midodrine  10 mg Oral TID WC   multivitamin with minerals  1 tablet Oral Daily   mupirocin ointment  1 Application Nasal BID    oxybutynin  5 mg Oral BID   pantoprazole  40 mg Oral QAC breakfast   phosphorus  250 mg Oral BID   rosuvastatin  20 mg Oral Daily   thiamine  100 mg Oral Daily   Continuous Infusions:  meropenem (MERREM) IV 1 g (03/14/22 0502)     LOS: 5 days      Phillips Climes, MD [facility]  If 7PM-7AM, please contact night-coverage  03/14/2022, 10:56 AM

## 2022-03-14 NOTE — Progress Notes (Signed)
Patient ID: Charles [name], male   DOB: Dec 18, 1960, 62 y.o.   MRN: [id number]  Agree with notes by hospitalist and ID. At this point pt needs OP f/u with joint surgeon (recommend Drs. Swinteck or Marchwiany) to discuss THA vs girdlestone vs non-operative management.    Lisette Abu, PA-C Orthopedic Surgery [phone]

## 2022-03-14 NOTE — Progress Notes (Signed)
Subjective: Cannot move his right leg   Antibiotics:  Anti-infectives (From admission, onward)    Start     Dose/Rate Route Frequency Ordered Stop   03/10/22 2000  meropenem (MERREM) 1 g in sodium chloride 0.9 % 100 mL IVPB        1 g 200 mL/hr over 30 Minutes Intravenous Every 8 hours 03/10/22 1424 03/15/22 1159   03/09/22 1000  fosfomycin (MONUROL) packet 3 g  Status:  Discontinued        3 g Oral  Once 03/09/22 0801 03/09/22 1159   03/08/22 1700  methenamine (HIPREX) tablet 1 g  Status:  Discontinued        1 g Oral 2 times daily with meals 03/08/22 1026 03/08/22 1325   03/08/22 1530  meropenem (MERREM) 1 g in sodium chloride 0.9 % 100 mL IVPB  Status:  Discontinued        1 g 200 mL/hr over 30 Minutes Intravenous Every 12 hours 03/08/22 1433 03/10/22 1424   03/08/22 0615  ceFEPIme (MAXIPIME) 1 g in sodium chloride 0.9 % 100 mL IVPB        1 g 200 mL/hr over 30 Minutes Intravenous  Once 03/08/22 0609 03/08/22 0730       Medications: Scheduled Meds:  aspirin EC  81 mg Oral Daily   docusate sodium  100 mg Oral BID   dronabinol  5 mg Oral BID AC   enoxaparin (LOVENOX) injection  20 mg Subcutaneous Q24H   ezetimibe  10 mg Oral Daily   feeding supplement (GLUCERNA SHAKE)  237 mL Oral TID WC   insulin aspart  0-9 Units Subcutaneous TID WC   insulin glargine-yfgn  22 Units Subcutaneous Daily   magnesium oxide  400 mg Oral Daily   midodrine  10 mg Oral TID WC   multivitamin with minerals  1 tablet Oral Daily   mupirocin ointment  1 Application Nasal BID   oxybutynin  5 mg Oral BID   pantoprazole  40 mg Oral QAC breakfast   phosphorus  250 mg Oral BID   rosuvastatin  20 mg Oral Daily   thiamine  100 mg Oral Daily   Continuous Infusions:  meropenem (MERREM) IV 1 g (03/14/22 0502)   PRN Meds:.acetaminophen **OR** acetaminophen, bisacodyl, guaiFENesin, hydrALAZINE, ipratropium-albuterol, metoprolol tartrate, ondansetron **OR** ondansetron (ZOFRAN) IV, oxyCODONE,  tiZANidine, traZODone    Objective: Weight change:   Intake/Output Summary (Last 24 hours) at 03/14/2022 1158 Last data filed at 03/14/2022 0211 Gross per 24 hour  Intake --  Output 1050 ml  Net -1050 ml   Blood pressure (!) 102/59, pulse 72, temperature 97.6 F (36.4 C), temperature source Oral, resp. rate 18, height 5\' 6"  (1.676 m), weight 40.8 kg, SpO2 100 %. Temp:  [97.6 F (36.4 C)-98.3 F (36.8 C)] 97.6 F (36.4 C) (01/02 0812) Pulse Rate:  [72-79] 72 (01/02 0812) Resp:  [16-18] 18 (01/02 0812) BP: (86-117)/(56-70) 102/59 (01/02 0812) SpO2:  [100 %] 100 % (01/02 6045)  Physical Exam: Physical Exam Constitutional:      Appearance: He is well-developed.  HENT:     Head: Normocephalic and atraumatic.  Eyes:     Conjunctiva/sclera: Conjunctivae normal.  Cardiovascular:     Rate and Rhythm: Normal rate and regular rhythm.  Pulmonary:     Effort: Pulmonary effort is normal. No respiratory distress.     Breath sounds: Normal breath sounds. No stridor. No wheezing.  Abdominal:  General: There is no distension.     Palpations: Abdomen is soft.  Musculoskeletal:     Cervical back: Normal range of motion and neck supple.  Skin:    General: Skin is warm and dry.     Findings: No erythema or rash.  Neurological:     General: No focal deficit present.     Mental Status: He is alert and oriented to person, place, and time.  Psychiatric:        Mood and Affect: Mood normal.        Behavior: Behavior normal.        Thought Content: Thought content normal.        Judgment: Judgment normal.      CBC:    BMET Recent Labs    03/13/22 0213 03/14/22 0330  NA 134* 135  K 4.0 3.7  CL 101 98  CO2 25 26  GLUCOSE 244* 121*  BUN 5* 11  CREATININE 0.58* 0.61  CALCIUM 8.6* 9.0     Liver Panel  No results for input(s): "PROT", "ALBUMIN", "AST", "ALT", "ALKPHOS", "BILITOT", "BILIDIR", "IBILI" in the last 72 hours.     Sedimentation Rate No results for  input(s): "ESRSEDRATE" in the last 72 hours. C-Reactive Protein No results for input(s): "CRP" in the last 72 hours.  Micro Results: Recent Results (from the past 720 hour(s))  Urine Culture     Status: Abnormal   Collection Time: 03/08/22  2:56 AM   Specimen: Urine, Catheterized  Result Value Ref Range Status   Specimen Description URINE, CATHETERIZED  Final   Special Requests   Final    NONE Performed at [facility], [address] [place], [place] [address]    Culture (A)  Final    >=100,000 COLONIES/mL ESCHERICHIA COLI Confirmed Extended Spectrum Beta-Lactamase Producer (ESBL).  In bloodstream infections from ESBL organisms, carbapenems are preferred over piperacillin/tazobactam. They are shown to have a lower risk of mortality.    Report Status 03/10/2022 FINAL  Final   Organism ID, Bacteria ESCHERICHIA COLI (A)  Final      Susceptibility   Escherichia coli - MIC*    AMPICILLIN >=32 RESISTANT Resistant     CEFAZOLIN >=64 RESISTANT Resistant     CEFEPIME 16 RESISTANT Resistant     CEFTRIAXONE >=64 RESISTANT Resistant     CIPROFLOXACIN >=4 RESISTANT Resistant     GENTAMICIN <=1 SENSITIVE Sensitive     IMIPENEM <=0.25 SENSITIVE Sensitive     NITROFURANTOIN <=16 SENSITIVE Sensitive     TRIMETH/SULFA >=320 RESISTANT Resistant     AMPICILLIN/SULBACTAM >=32 RESISTANT Resistant     PIP/TAZO 64 INTERMEDIATE Intermediate     * >=100,000 COLONIES/mL ESCHERICHIA COLI  Culture, blood (routine x 2)     Status: None   Collection Time: 03/08/22  6:18 AM   Specimen: BLOOD LEFT ARM  Result Value Ref Range Status   Specimen Description BLOOD LEFT ARM  Final   Special Requests   Final    BOTTLES DRAWN AEROBIC AND ANAEROBIC Blood Culture results may not be optimal due to an inadequate volume of blood received in culture bottles   Culture   Final    NO GROWTH 5 DAYS Performed at [facility], [address] [place], [place] [address]    Report Status 03/13/2022 FINAL   Final  Culture, blood (routine x 2)     Status: None   Collection Time: 03/08/22  6:18 AM   Specimen: BLOOD RIGHT ARM  Result Value Ref Range Status   Specimen Description BLOOD RIGHT ARM  Final   Special Requests   Final    BOTTLES DRAWN AEROBIC AND ANAEROBIC Blood Culture results may not be optimal due to an inadequate volume of blood received in culture bottles   Culture   Final    NO GROWTH 5 DAYS Performed at [facility], [place] [address], [place], [place] [address]    Report Status 03/13/2022 FINAL  Final  MRSA Next Gen by PCR, Nasal     Status: Abnormal   Collection Time: 03/08/22  5:58 PM   Specimen: Nasal Mucosa; Nasal Swab  Result Value Ref Range Status   MRSA by PCR Next Gen DETECTED (A) NOT DETECTED Final    Comment: RESULT CALLED TO, READ BACK BY AND VERIFIED WITH: RN Vilma Prader [phone] @2106  FH (NOTE) The GeneXpert MRSA Assay (FDA approved for NASAL specimens only), is one component of a comprehensive MRSA colonization surveillance program. It is not intended to diagnose MRSA infection nor to guide or monitor treatment for MRSA infections. Test performance is not FDA approved in patients less than 32 years old. Performed at [facility], [place] [address], [place], [place] [address]   Body fluid culture w Gram Stain     Status: None   Collection Time: 03/10/22  7:57 AM   Specimen: Other Source; Body Fluid  Result Value Ref Range Status   Specimen Description OTHER  Final   Special Requests  RIGHT HIP  Final   Gram Stain   Final    RARE WBC PRESENT, PREDOMINANTLY PMN NO ORGANISMS SEEN    Culture   Final    NO GROWTH 3 DAYS Performed at [facility], [address], [place], [place] [address]    Report Status 03/13/2022 FINAL  Final    Studies/Results: No results found.    Assessment/Plan:  INTERVAL HISTORY: Hip aspirate unrevealing   Principal Problem:   Complicated UTI (urinary tract infection) Active Problems:   DM2 (diabetes  mellitus, type 2) (HCC)   HLD (hyperlipidemia)   Pressure injury of skin   Chronic orthostatic hypotension   Physical deconditioning   Progressive neurologic decline   UTI due to extended-spectrum beta lactamase (ESBL) producing Escherichia coli   Pyelonephritis   Closed fracture of left femur ([place])    Charles [name] is a 62 y.o. male with history of [place] he had bacteremia, UTI admitted with obstructive Foley catheter.  He also sustained a fall with a left hip fracture and there was some concern for infection there.  The hip joint was aspirated but cell count not sent cultures were sent which have been unrevealing.  Urine yielded ESBL   #1 ESBL UTI:  Completed sufficient therapy for this  #2 ? Septic hip: cell would have been more helpful than the culture given he was already on antibiotics  I think the most rational thing at this point in time is to stop his antibiotics and observe him off of them if he truly has a septic hip this will declare itself over the next few days and weeks.  I spent 52 minutes with the patient including than 50% of the time in face to face counseling of the patient writing his recurrent urinary tract infections his recent 1 with ESBL the concern for infection of his hip where he has fracture along with review of medical records in preparation for the visit and during the visit and in coordination  of his care.    LOS: 5 days   Alcide Evener 03/14/2022, 11:58 AM

## 2022-03-15 DIAGNOSIS — M009 Pyogenic arthritis, unspecified: Secondary | ICD-10-CM | POA: Diagnosis not present

## 2022-03-15 DIAGNOSIS — S7292XD Unspecified fracture of left femur, subsequent encounter for closed fracture with routine healing: Secondary | ICD-10-CM

## 2022-03-15 DIAGNOSIS — E119 Type 2 diabetes mellitus without complications: Secondary | ICD-10-CM

## 2022-03-15 DIAGNOSIS — N390 Urinary tract infection, site not specified: Secondary | ICD-10-CM | POA: Diagnosis not present

## 2022-03-15 DIAGNOSIS — B9629 Other Escherichia coli [E. coli] as the cause of diseases classified elsewhere: Secondary | ICD-10-CM

## 2022-03-15 DIAGNOSIS — Z794 Long term (current) use of insulin: Secondary | ICD-10-CM

## 2022-03-15 DIAGNOSIS — Z1612 Extended spectrum beta lactamase (ESBL) resistance: Secondary | ICD-10-CM

## 2022-03-15 DIAGNOSIS — S7292XS Unspecified fracture of left femur, sequela: Secondary | ICD-10-CM | POA: Diagnosis not present

## 2022-03-15 DIAGNOSIS — N12 Tubulo-interstitial nephritis, not specified as acute or chronic: Secondary | ICD-10-CM | POA: Diagnosis not present

## 2022-03-15 LAB — MAGNESIUM: Magnesium: 1.9 mg/dL (ref 1.7–2.4)

## 2022-03-15 LAB — CBC
HCT: 30.4 % — ABNORMAL LOW (ref 39.0–52.0)
Hemoglobin: 9.8 g/dL — ABNORMAL LOW (ref 13.0–17.0)
MCH: 26.6 pg (ref 26.0–34.0)
MCHC: 32.2 g/dL (ref 30.0–36.0)
MCV: 82.6 fL (ref 80.0–100.0)
Platelets: 425 10*3/uL — ABNORMAL HIGH (ref 150–400)
RBC: 3.68 MIL/uL — ABNORMAL LOW (ref 4.22–5.81)
RDW: 15 % (ref 11.5–15.5)
WBC: 22.6 10*3/uL — ABNORMAL HIGH (ref 4.0–10.5)
nRBC: 0 % (ref 0.0–0.2)

## 2022-03-15 LAB — BASIC METABOLIC PANEL
Anion gap: 11 (ref 5–15)
BUN: 13 mg/dL (ref 6–20)
CO2: 23 mmol/L (ref 22–32)
Calcium: 8.4 mg/dL — ABNORMAL LOW (ref 8.9–10.3)
Chloride: 98 mmol/L (ref 98–111)
Creatinine, Ser: 0.65 mg/dL (ref 0.61–1.24)
GFR, Estimated: >60 mL/min (ref >60)
Glucose, Bld: 274 mg/dL — ABNORMAL HIGH (ref 70–99)
Potassium: 3.6 mmol/L (ref 3.5–5.1)
Sodium: 132 mmol/L — ABNORMAL LOW (ref 135–145)

## 2022-03-15 LAB — GLUCOSE, CAPILLARY
Glucose-Capillary: 250 mg/dL — ABNORMAL HIGH (ref 70–99)
Glucose-Capillary: 181 mg/dL — ABNORMAL HIGH (ref 70–99)
Glucose-Capillary: 57 mg/dL — ABNORMAL LOW (ref 70–99)

## 2022-03-15 LAB — PHOSPHORUS: Phosphorus: 3.1 mg/dL (ref 2.5–4.6)

## 2022-03-15 MED ORDER — CHLORHEXIDINE GLUCONATE CLOTH 2 % EX PADS
6.0000 | MEDICATED_PAD | Freq: Every day | CUTANEOUS | Status: DC
  Administered 2022-03-15: 6 via TOPICAL

## 2022-03-15 MED ORDER — OXYCODONE HCL 5 MG PO TABS: 5.0000 mg | ORAL_TABLET | ORAL | 0 refills | Status: AC | PRN

## 2022-03-15 NOTE — Discharge Summary (Addendum)
Physician Discharge Summary   [name] MRN: [id number] DOB: 11-05-1960  Admit date:     03/08/2022  Discharge date: 03/15/22  Discharge Physician: Oswald Hillock   PCP: Pcp, No   Recommendations at discharge:    As per ID recommendation; best option here is to observe him off of antibiotics and if he worsens he needs to be re-evaluated by Orthopedics with repeat aspiration OFF of antibiotics with cell count and diff, and culture.  Follow-up Dr. Lyla Glassing in 2 weeks  Discharge Diagnoses: Principal Problem:   Complicated UTI (urinary tract infection) Active Problems:   DM2 (diabetes mellitus, type 2) (HCC)   HLD (hyperlipidemia)   Pressure injury of skin   Chronic orthostatic hypotension   Physical deconditioning   Progressive neurologic decline   UTI due to extended-spectrum beta lactamase (ESBL) producing Escherichia coli   Pyelonephritis   Closed fracture of left femur (HCC)   Septic hip (HCC)  Resolved Problems:   * No resolved hospital problems. *  Hospital Course:  62 year old with history of progressive neurologic decline, DM 2, chronic urinary retention with indwelling Foley presented to the hospital with abdominal pain.  Patient was also admitted about 6 weeks ago for UTI from Providencia and Enterococcus, treated with ertapenem.  At first she was doing okay but now having symptoms again.  CT scan showed acute pyelonephritis with other multiple abnormal findings.  Due to right hip fracture and fluid collection, Ortho consulted and planned hip/joint aspiration to evaluate for any sort of infection.  Hip aspiration performed on 12/29 which does not show any obvious evidence of infection.    Assessment and Plan:  Complicated UTI, previous history of Providencia, ESBL and Enterococcus. Acute pyelonephritis, right-sided -Patient has chronic indwelling Foley catheter.  This was exchanged in the ER and relieved his urinary retention.   --Urine culture growing  ESBL again  -Patient was started on meropenem -ID was consulted, has stopped antibiotic therapy as he has completed sufficient therapy as per ID.      Right hip fracture -Appears that this was present on CT scan back in June 2023.  No follow-up or mention of that since.  -Orthopedic and bents been consulted as well due to presence of fluid collection, this post aspiration by IR, limited amount of fluid aspirated, sent for stain and culture, so far remains negative . -Eventually will need hip replacement, have discussed with orthopedic, sports Ortho, he will need total joint surgeon as an outpatient to discuss possible total hip replacement, wont happen during this hospitalization, especially with concern of fluid collection and infection at this point .Patient can be weight bearing  as tolerated and to mobilize with PT as discussed with orthopedic.   -Patient to follow-up Dr. Lyla Glassing as outpatient. -As per ID; best option here is to observe him off of antibiotics and if he worsens he needs to be re-evaluated by Orthopedics with repeat aspiration OFF of antibiotics with cell count and diff, and culture.    Progressive neurological decline Chronic urinary retention -Patient is a young person with severe neurologic progression that has been thoroughly investigated without apparent cause -He has had Foley in place since April 2023, changed during this admission -PT/OT eventually -Continue Marinol, tizanidine   DM 2 Recent A1c 7.7.  On long-acting and sliding scale   HLD -Continue rosuvastatin, Zetia   Chronic hypotension -Continue midodrine   Pressure ulcer, sacral stage II.  PTA -Supportive care   Abnormal CT scan finding - Chronic subcapital  right femoral fracture previously seen in June 2023.  Has increasing lucencies of that area concerning for osteonecrosis versus infection, management as mentioned above.  Continue supportive care - Has chronic anterior wedge compression fracture  of thoracic and lumbar vertebrae.  Pain control - Constipation.  Bowel regimen.  Will also give lactulose today - Mesenteric haziness/ascites.  Likely from malnutrition - Chronic calcified pancreatitis.  Supportive care   DNR -Dr EIgergawy  discussed code status with the patient and he would prefer to die a natural death should that situation arise.    Hypomagnesemia Hypophosphatemia -- repleted               Consultants: Infectious disease Procedures performed:  Disposition: Skilled nursing facility Diet recommendation:  Discharge Diet Orders (From admission, onward)     Start     Ordered   03/15/22 0000  Diet - low sodium heart healthy        03/15/22 1346           Regular diet DISCHARGE MEDICATION: Allergies as of 03/15/2022   No Known Allergies      Medication List     STOP taking these medications    traMADol 50 MG tablet Commonly known as: ULTRAM       TAKE these medications    aspirin EC 81 MG tablet Take 1 tablet (81 mg total) by mouth daily. Swallow whole.   blood glucose meter kit and supplies Dispense based on patient and insurance preference. Use up to four times daily as directed. (FOR ICD-10 E10.9, E11.9).   docusate sodium 100 MG capsule Commonly known as: COLACE Take 100 mg by mouth at bedtime.   dronabinol 5 MG capsule Commonly known as: MARINOL Take 5 mg by mouth 2 (two) times daily before a meal.   ezetimibe 10 MG tablet Commonly known as: ZETIA Take 1 tablet (10 mg total) by mouth daily.   Glucerna Liqd Take 237 mLs by mouth 3 (three) times daily with meals.   insulin glargine-yfgn 100 UNIT/ML injection Commonly known as: SEMGLEE Inject 0.18 mLs (18 Units total) into the skin daily.   insulin lispro 200 UNIT/ML KwikPen Commonly known as: HUMALOG Inject into the skin in the morning, at noon, in the evening, and at bedtime. Inject as per sliding scale: if 121-150 = 3 units; 151-200 = 4 units; 201-250 = 7 units;  251-300 = 11 units; 301-350 = 15 units; 351-400 = 20 units. If CBG>400 call MD/NP if < 70 notify MD/NP   loperamide 2 MG capsule Commonly known as: IMODIUM Take 1 capsule (2 mg total) by mouth as needed for diarrhea or loose stools.   magnesium oxide 400 (240 Mg) MG tablet Commonly known as: MAG-OX Take 400 mg by mouth daily.   methenamine 1 g tablet Commonly known as: HIPREX Take 1 g by mouth 2 (two) times daily.   midodrine 10 MG tablet Commonly known as: PROAMATINE Take 1 tablet (10 mg total) by mouth 3 (three) times daily with meals.   multivitamin with minerals Tabs tablet Take 1 tablet by mouth daily.   NON FORMULARY Take 118 mLs by mouth See admin instructions. Magic Cup desert cup- Eat 1 "cup" (118 ml's) by mouth 2 times a day- with lunch and supper   oxybutynin 5 MG tablet Commonly known as: DITROPAN Take 5 mg by mouth 2 (two) times daily.   oxyCODONE 5 MG immediate release tablet Commonly known as: Oxy IR/ROXICODONE Take 1 tablet (5 mg total) by mouth   every 4 (four) hours as needed for moderate pain.   pantoprazole 40 MG tablet Commonly known as: PROTONIX Take 1 tablet (40 mg total) by mouth daily. Any generic PPI is okay to dispense What changed:  when to take this additional instructions   polyethylene glycol 17 g packet Commonly known as: MIRALAX / GLYCOLAX Take 17 g by mouth daily.   rosuvastatin 20 MG tablet Commonly known as: CRESTOR Take 20 mg by mouth daily.   thiamine 100 MG tablet Commonly known as: VITAMIN B1 Take 1 tablet (100 mg total) by mouth daily.   tiZANidine 2 MG tablet Commonly known as: ZANAFLEX Take 2 mg by mouth every 8 (eight) hours as needed for muscle spasms.   zinc oxide 20 % ointment Apply 1 Application topically 2 (two) times daily. Apply to scrotum topically every shift for excoriation        Follow-up Information     Swinteck, Aaron Edelman, MD. Schedule an appointment as soon as possible for a visit in 2 week(s).    Specialty: Orthopedic Surgery Contact information: [address] [place] [address] [place] [address] [phone]                Discharge Exam: Danley Danker Weights   03/08/22 0247  Weight: 40.8 kg   Appears in no acute distress S1-S2, regular Clear to auscultation bilaterally  Condition at discharge: good  The results of significant diagnostics from this hospitalization (including imaging, microbiology, ancillary and laboratory) are listed below for reference.   Imaging Studies: MR Lumbar Spine W Wo Contrast  Result Date: 03/11/2022 CLINICAL DATA:  Low back pain, infection suspected EXAM: MRI LUMBAR SPINE WITHOUT AND WITH CONTRAST TECHNIQUE: Multiplanar and multiecho pulse sequences of the lumbar spine were obtained without and with intravenous contrast. CONTRAST:  76m GADAVIST GADOBUTROL 1 MMOL/ML IV SOLN COMPARISON:  Lumbar spine MRI 04/12/2021 CT abdomen pelvis 08/19/2021 FINDINGS: Segmentation:  Standard Alignment:  Normal Vertebrae: Superior endplate Schmorl's nodes at L3 and L5 present on CT abdomen pelvis 08/19/2021. No acute abnormality. No discitis-osteomyelitis. Conus medullaris and cauda equina: Conus extends to the L1 level. Conus and cauda equina appear normal. Paraspinal and other soft tissues: Large amount of stool in the colon. Disc levels: T12-L1: Normal. L1-L2: Normal disc space and facet joints. No spinal canal stenosis. No neural foraminal stenosis. L2-L3: Normal disc space and facet joints. No spinal canal stenosis. No neural foraminal stenosis. L3-L4: Normal disc space and facet joints. No spinal canal stenosis. No neural foraminal stenosis. L4-L5: Small disc bulge. Normal facets. No spinal canal stenosis. Mild left neural foraminal stenosis. L5-S1: Normal disc space and facet joints. No spinal canal stenosis. No neural foraminal stenosis. Visualized sacrum: Normal. IMPRESSION: 1. No discitis-osteomyelitis or epidural abscess. 2. Mild left L4-5 neural foraminal  stenosis. 3. Large amount of stool in the colon. Correlate for symptoms of constipation. Electronically Signed   By: KUlyses JarredM.D.   On: 03/11/2022 00:38   CT GUIDED NEEDLE PLACEMENT  Result Date: 03/10/2022 INDICATION: Right hip nonunion fracture with enlarging complex effusion EXAM: CT ASPIRATION RIGHT HIP JOINT MEDICATIONS: 1% lidocaine local ANESTHESIA/SEDATION: None. COMPLICATIONS: None immediate. PROCEDURE: Informed written consent was obtained from the patient after a thorough discussion of the procedural risks, benefits and alternatives. All questions were addressed. Maximal Sterile Barrier Technique was utilized including caps, mask, sterile gowns, sterile gloves, sterile drape, hand hygiene and skin antiseptic. A timeout was performed prior to the initiation of the procedure. Previous imaging reviewed. Patient positioned supine. Noncontrast localization  CT performed. The complex right hip joint effusion was localized and marked. This was correlated with the recent CT 2 days ago. Under sterile conditions and local anesthesia, an 18 gauge needle was advanced into the hip joint fluid along the nonunion fracture. Needle position confirmed with CT. Syringe aspiration and needle manipulation yielded 2 cc clear synovial fluid. Sample sent for laboratory analysis and culture as ordered. Needle removed. Patient tolerated procedure well. No immediate complication. IMPRESSION: Successful CT-guided right hip joint aspiration. Electronically Signed   By: M.  Shick M.D.   On: 03/10/2022 10:24   CT ABDOMEN PELVIS W CONTRAST  Result Date: 03/08/2022 CLINICAL DATA:  Abdominal pain. EXAM: CT ABDOMEN AND PELVIS WITH CONTRAST TECHNIQUE: Multidetector CT imaging of the abdomen and pelvis was performed using the standard protocol following bolus administration of intravenous contrast. RADIATION DOSE REDUCTION: This exam was performed according to the departmental dose-optimization program which includes automated  exposure control, adjustment of the mA and/or kV according to patient size and/or use of iterative reconstruction technique. CONTRAST:  65mL OMNIPAQUE IOHEXOL 350 MG/ML SOLN COMPARISON:  CT without contrast 08/19/2021, CT with IV contrast 07/03/2021 FINDINGS: Lower chest: The cardiac size is normal. There are three-vessel coronary artery calcifications. No pericardial effusion. Lung bases are clear. Hepatobiliary: No focal liver abnormality is seen. No calcified gallstones, gallbladder wall thickening, or biliary dilatation. Pancreas: There are calcifications in the pancreatic head consistent with chronic calcific pancreatitis. There is no evidence of acute inflammatory change, mass enhancement or ductal dilatation. Spleen: Normal. Adrenals/Urinary Tract: There is no adrenal mass. There is faint patchy hypoenhancement right kidney most likely due to pyelonephritis with asymmetric perinephric stranding. There is no urinary stone or hydroureteronephrosis. No renal mass enhancement. There is a 5 mm hypodensity in the inferior pole of the left kidney which is too small to characterize but most likely representing a cyst. No follow-up imaging is recommended. No findings of left-sided pyelonephritis. The bladder is catheterized but again appears diffusely thickened, which was seen previously. Stomach/Bowel: There is fold thickening of the proximal to mid stomach and in some of the left abdominal small bowel. There is no small bowel dilatation or inflammation. The appendix is normal. There is moderate diffuse retained stool. Scattered diverticula noted without evidence of acute diverticulitis. Vascular/Lymphatic: Aortic atherosclerosis. No enlarged abdominal or pelvic lymph nodes. Reproductive: Mild prostatomegaly. TURP defect appears new from 08/19/2021. Other: There is minimal ascites in the posterior pelvis. Diffuse mesenteric haziness. This could be congestive or due to malnutrition and/or hepatic dysfunction. There is  no free hemorrhage, free air or focal acute inflammatory change. Musculoskeletal: First seen on 08/19/2021, again noted is a subcapital right femoral fracture with the distal fragment increasingly moderately cephalad displaced, multiple heterotopic bone formations in the area, and moderate fluid in joint space with bony debris. There are increased lucencies in right femoral which could be due to evolution of osteonecrosis of the femoral head or could be on an infectious basis. There are small scattered erosions of right femoral head articular surface but not of the acetabulum. There are mild chronic upper plate anterior wedge compression fractures of the T12, L1 and L3 vertebral bodies, mild degenerative changes of the spine. There are bridging osteophytes of the SI joints. IMPRESSION: 1. Faint patchy hypoenhancement of the right kidney most likely due to pyelonephritis with asymmetric perinephric stranding. No hydronephrosis or urinary stone. 2. Cystitis versus bladder nondistention. 3. Constipation and diverticulosis. 4. Aortic and coronary artery atherosclerosis. 5. Diffuse mesenteric haziness which could   be congestive or due to malnutrition and/or hepatic dysfunction. Minimal ascites in the posterior pelvis. 6. Chronic calcific pancreatitis, only seen in the pancreatic head. No acute inflammatory change. 7. Subcapital right femoral fracture, first seen on CT abdomen and pelvis 08/19/2021, with increasingly moderately superiorly displaced distal fragment, multiple heterotopic bone formations in the area, and moderate fluid in the joint space with bony debris. 8. Increased lucencies in the right femoral head are noted today which could be due to evolution of osteonecrosis or could be on an infectious basis. 9. Mild prostatomegaly with new TURP defect. 10. Chronic mild upper plate anterior wedge compression fractures of the T12, L1 and L3 vertebral bodies. Aortic Atherosclerosis (ICD10-I70.0). Electronically Signed    By: Keith  Chesser M.D.   On: 03/08/2022 06:28    Microbiology: Results for orders placed or performed during the hospital encounter of 03/08/22  Urine Culture     Status: Abnormal   Collection Time: 03/08/22  2:56 AM   Specimen: Urine, Catheterized  Result Value Ref Range Status   Specimen Description URINE, CATHETERIZED  Final   Special Requests   Final    NONE Performed at [facility], [address] [place], [place] [address]    Culture (A)  Final    >=100,000 COLONIES/mL ESCHERICHIA COLI Confirmed Extended Spectrum Beta-Lactamase Producer (ESBL).  In bloodstream infections from ESBL organisms, carbapenems are preferred over piperacillin/tazobactam. They are shown to have a lower risk of mortality.    Report Status 03/10/2022 FINAL  Final   Organism ID, Bacteria ESCHERICHIA COLI (A)  Final      Susceptibility   Escherichia coli - MIC*    AMPICILLIN >=32 RESISTANT Resistant     CEFAZOLIN >=64 RESISTANT Resistant     CEFEPIME 16 RESISTANT Resistant     CEFTRIAXONE >=64 RESISTANT Resistant     CIPROFLOXACIN >=4 RESISTANT Resistant     GENTAMICIN <=1 SENSITIVE Sensitive     IMIPENEM <=0.25 SENSITIVE Sensitive     NITROFURANTOIN <=16 SENSITIVE Sensitive     TRIMETH/SULFA >=320 RESISTANT Resistant     AMPICILLIN/SULBACTAM >=32 RESISTANT Resistant     PIP/TAZO 64 INTERMEDIATE Intermediate     * >=100,000 COLONIES/mL ESCHERICHIA COLI  Culture, blood (routine x 2)     Status: None   Collection Time: 03/08/22  6:18 AM   Specimen: BLOOD LEFT ARM  Result Value Ref Range Status   Specimen Description BLOOD LEFT ARM  Final   Special Requests   Final    BOTTLES DRAWN AEROBIC AND ANAEROBIC Blood Culture results may not be optimal due to an inadequate volume of blood received in culture bottles   Culture   Final    NO GROWTH 5 DAYS Performed at [facility], [address] [place], [place] [address]    Report Status 03/13/2022 FINAL  Final  Culture, blood (routine x  2)     Status: None   Collection Time: 03/08/22  6:18 AM   Specimen: BLOOD RIGHT ARM  Result Value Ref Range Status   Specimen Description BLOOD RIGHT ARM  Final   Special Requests   Final    BOTTLES DRAWN AEROBIC AND ANAEROBIC Blood Culture results may not be optimal due to an inadequate volume of blood received in culture bottles   Culture   Final    NO GROWTH 5 DAYS Performed at  H[facility], [address] [place], [place] [address]    Report Status 03/13/2022 FINAL  Final  MRSA   Next Gen by PCR, Nasal     Status: Abnormal   Collection Time: 03/08/22  5:58 PM   Specimen: Nasal Mucosa; Nasal Swab  Result Value Ref Range Status   MRSA by PCR Next Gen DETECTED (A) NOT DETECTED Final    Comment: RESULT CALLED TO, READ BACK BY AND VERIFIED WITH: RN P. MARTIN [id number] @2106 FH (NOTE) The GeneXpert MRSA Assay (FDA approved for NASAL specimens only), is one component of a comprehensive MRSA colonization surveillance program. It is not intended to diagnose MRSA infection nor to guide or monitor treatment for MRSA infections. Test performance is not FDA approved in patients less than 2 years old. Performed at [facility], [address] [place], [place] [address]   Body fluid culture w Gram Stain     Status: None   Collection Time: 03/10/22  7:57 AM   Specimen: Other Source; Body Fluid  Result Value Ref Range Status   Specimen Description OTHER  Final   Special Requests  RIGHT HIP  Final   Gram Stain   Final    RARE WBC PRESENT, PREDOMINANTLY PMN NO ORGANISMS SEEN    Culture   Final    NO GROWTH 3 DAYS Performed at [facility], [address] [place], [place] [address]    Report Status 03/13/2022 FINAL  Final    Labs: CBC: Recent Labs  Lab 03/11/22 0401 03/12/22 0202 03/13/22 0213 03/14/22 0330 03/15/22 0238  WBC 12.3* 11.4* 12.1* 18.6* 22.6*  HGB 9.5* 9.7* 9.5* 10.4* 9.8*  HCT 30.0* 29.4* 28.8* 31.1* 30.4*  MCV 82.2 82.4 81.6 81.2 82.6  PLT  351 406* 400 396 425*   Basic Metabolic Panel: Recent Labs  Lab 03/11/22 0401 03/12/22 0202 03/13/22 0213 03/14/22 0330 03/15/22 0238  NA 134* 136 134* 135 132*  K 4.0 3.4* 4.0 3.7 3.6  CL 102 100 101 98 98  CO2 22 26 25 26 23  GLUCOSE 235* 262* 244* 121* 274*  BUN 8 9 5* 11 13  CREATININE 0.64 0.62 0.58* 0.61 0.65  CALCIUM 8.6* 8.5* 8.6* 9.0 8.4*  MG 2.0 1.9 1.9 2.0 1.9  PHOS  --  2.4*  --   --  3.1   Liver Function Tests: No results for input(s): "AST", "ALT", "ALKPHOS", "BILITOT", "PROT", "ALBUMIN" in the last 168 hours. CBG: Recent Labs  Lab 03/14/22 1206 03/14/22 1618 03/14/22 2306 03/15/22 0852 03/15/22 1131  GLUCAP 80 212* 158* 250* 181*    Discharge time spent: greater than 30 minutes.  Signed:  S , MD [facility] 03/15/2022 

## 2022-03-15 NOTE — Progress Notes (Signed)
Subjective: No new complaints   Antibiotics:  Anti-infectives (From admission, onward)    Start     Dose/Rate Route Frequency Ordered Stop   03/10/22 2000  meropenem (MERREM) 1 g in sodium chloride 0.9 % 100 mL IVPB        1 g 200 mL/hr over 30 Minutes Intravenous Every 8 hours 03/10/22 1424 03/15/22 0534   03/09/22 1000  fosfomycin (MONUROL) packet 3 g  Status:  Discontinued        3 g Oral  Once 03/09/22 0801 03/09/22 1159   03/08/22 1700  methenamine (HIPREX) tablet 1 g  Status:  Discontinued        1 g Oral 2 times daily with meals 03/08/22 1026 03/08/22 1325   03/08/22 1530  meropenem (MERREM) 1 g in sodium chloride 0.9 % 100 mL IVPB  Status:  Discontinued        1 g 200 mL/hr over 30 Minutes Intravenous Every 12 hours 03/08/22 1433 03/10/22 1424   03/08/22 0615  ceFEPIme (MAXIPIME) 1 g in sodium chloride 0.9 % 100 mL IVPB        1 g 200 mL/hr over 30 Minutes Intravenous  Once 03/08/22 0609 03/08/22 0730       Medications: Scheduled Meds:  aspirin EC  81 mg Oral Daily   Chlorhexidine Gluconate Cloth  6 each Topical Daily   docusate sodium  100 mg Oral BID   dronabinol  5 mg Oral BID AC   enoxaparin (LOVENOX) injection  20 mg Subcutaneous Q24H   ezetimibe  10 mg Oral Daily   feeding supplement (GLUCERNA SHAKE)  237 mL Oral TID WC   insulin aspart  0-9 Units Subcutaneous TID WC   insulin glargine-yfgn  22 Units Subcutaneous Daily   magnesium oxide  400 mg Oral Daily   midodrine  10 mg Oral TID WC   multivitamin with minerals  1 tablet Oral Daily   oxybutynin  5 mg Oral BID   pantoprazole  40 mg Oral QAC breakfast   phosphorus  250 mg Oral BID   rosuvastatin  20 mg Oral Daily   thiamine  100 mg Oral Daily   Continuous Infusions:   PRN Meds:.acetaminophen **OR** acetaminophen, bisacodyl, guaiFENesin, hydrALAZINE, ipratropium-albuterol, metoprolol tartrate, ondansetron **OR** ondansetron (ZOFRAN) IV, oxyCODONE, tiZANidine,  traZODone    Objective: Weight change:  No intake or output data in the 24 hours ending 03/15/22 1242  Blood pressure 104/68, pulse 88, temperature 97.8 F (36.6 C), temperature source Oral, resp. rate 18, height 5\' 6"  (1.676 m), weight 40.8 kg, SpO2 100 %. Temp:  [97.8 F (36.6 C)-98.8 F (37.1 C)] 97.8 F (36.6 C) (01/03 0854) Pulse Rate:  [78-99] 88 (01/03 0854) Resp:  [18-20] 18 (01/03 0854) BP: (104-123)/(68-75) 104/68 (01/03 0854) SpO2:  [100 %] 100 % (01/03 0854)  Physical Exam: Physical Exam Constitutional:      Appearance: He is well-developed.  HENT:     Head: Normocephalic and atraumatic.  Eyes:     Conjunctiva/sclera: Conjunctivae normal.  Cardiovascular:     Rate and Rhythm: Normal rate and regular rhythm.  Pulmonary:     Effort: Pulmonary effort is normal. No respiratory distress.     Breath sounds: No wheezing.  Abdominal:     General: There is no distension.     Palpations: Abdomen is soft.  Musculoskeletal:        General: Normal range of motion.     Cervical back: Normal  range of motion and neck supple.  Skin:    General: Skin is warm and dry.     Findings: No erythema or rash.  Neurological:     General: No focal deficit present.     Mental Status: He is alert and oriented to person, place, and time.  Psychiatric:        Mood and Affect: Mood normal.        Behavior: Behavior normal.        Thought Content: Thought content normal.        Judgment: Judgment normal.      CBC:    BMET Recent Labs    03/14/22 0330 03/15/22 0238  NA 135 132*  K 3.7 3.6  CL 98 98  CO2 26 23  GLUCOSE 121* 274*  BUN 11 13  CREATININE 0.61 0.65  CALCIUM 9.0 8.4*      Liver Panel  No results for input(s): "PROT", "ALBUMIN", "AST", "ALT", "ALKPHOS", "BILITOT", "BILIDIR", "IBILI" in the last 72 hours.     Sedimentation Rate No results for input(s): "ESRSEDRATE" in the last 72 hours. C-Reactive Protein No results for input(s): "CRP" in the last  72 hours.  Micro Results: Recent Results (from the past 720 hour(s))  Urine Culture     Status: Abnormal   Collection Time: 03/08/22  2:56 AM   Specimen: Urine, Catheterized  Result Value Ref Range Status   Specimen Description URINE, CATHETERIZED  Final   Special Requests   Final    NONE Performed at [facility], [address], [place], [place] [address]    Culture (A)  Final    >=100,000 COLONIES/mL ESCHERICHIA COLI Confirmed Extended Spectrum Beta-Lactamase Producer (ESBL).  In bloodstream infections from ESBL organisms, carbapenems are preferred over piperacillin/tazobactam. They are shown to have a lower risk of mortality.    Report Status 03/10/2022 FINAL  Final   Organism ID, Bacteria ESCHERICHIA COLI (A)  Final      Susceptibility   Escherichia coli - MIC*    AMPICILLIN >=32 RESISTANT Resistant     CEFAZOLIN >=64 RESISTANT Resistant     CEFEPIME 16 RESISTANT Resistant     CEFTRIAXONE >=64 RESISTANT Resistant     CIPROFLOXACIN >=4 RESISTANT Resistant     GENTAMICIN <=1 SENSITIVE Sensitive     IMIPENEM <=0.25 SENSITIVE Sensitive     NITROFURANTOIN <=16 SENSITIVE Sensitive     TRIMETH/SULFA >=320 RESISTANT Resistant     AMPICILLIN/SULBACTAM >=32 RESISTANT Resistant     PIP/TAZO 64 INTERMEDIATE Intermediate     * >=100,000 COLONIES/mL ESCHERICHIA COLI  Culture, blood (routine x 2)     Status: None   Collection Time: 03/08/22  6:18 AM   Specimen: BLOOD LEFT ARM  Result Value Ref Range Status   Specimen Description BLOOD LEFT ARM  Final   Special Requests   Final    BOTTLES DRAWN AEROBIC AND ANAEROBIC Blood Culture results may not be optimal due to an inadequate volume of blood received in culture bottles   Culture   Final    NO GROWTH 5 DAYS Performed at [facility], [address], [place],  [address]    Report Status 03/13/2022 FINAL  Final  Culture, blood (routine x 2)     Status: None   Collection Time: 03/08/22  6:18 AM   Specimen: BLOOD  RIGHT ARM  Result Value Ref Range Status   Specimen Description BLOOD RIGHT ARM  Final   Special Requests  Final    BOTTLES DRAWN AEROBIC AND ANAEROBIC Blood Culture results may not be optimal due to an inadequate volume of blood received in culture bottles   Culture   Final    NO GROWTH 5 DAYS Performed at [facility], [place] [address] [place], [place] [address]    Report Status 03/13/2022 FINAL  Final  MRSA Next Gen by PCR, Nasal     Status: Abnormal   Collection Time: 03/08/22  5:58 PM   Specimen: Nasal Mucosa; Nasal Swab  Result Value Ref Range Status   MRSA by PCR Next Gen DETECTED (A) NOT DETECTED Final    Comment: RESULT CALLED TO, READ BACK BY AND VERIFIED WITH: RN Vilma Prader [phone] @[phone]  FH (NOTE) The GeneXpert MRSA Assay (FDA approved for NASAL specimens only), is one component of a comprehensive MRSA colonization surveillance program. It is not intended to diagnose MRSA infection nor to guide or monitor treatment for MRSA infections. Test performance is not FDA approved in patients less than 68 years old. Performed at [facility], [place] [address] [place], [place] [address]   Body fluid culture w Gram Stain     Status: None   Collection Time: 03/10/22  7:57 AM   Specimen: Other Source; Body Fluid  Result Value Ref Range Status   Specimen Description OTHER  Final   Special Requests  RIGHT HIP  Final   Gram Stain   Final    RARE WBC PRESENT, PREDOMINANTLY PMN NO ORGANISMS SEEN    Culture   Final    NO GROWTH 3 DAYS Performed at [facility], [address] [place], [place] [address]    Report Status 03/13/2022 FINAL  Final    Studies/Results: No results found.    Assessment/Plan:  INTERVAL HISTORY: Hip aspirate no growth final   Principal Problem:   Complicated UTI (urinary tract infection) Active Problems:   DM2 (diabetes mellitus, type 2) (HCC)   HLD (hyperlipidemia)   Pressure injury of skin   Chronic orthostatic hypotension    Physical deconditioning   Progressive neurologic decline   UTI due to extended-spectrum beta lactamase (ESBL) producing Escherichia coli   Pyelonephritis   Closed fracture of left femur (HCC)   Septic hip ([place])    Charles [name] is a 62 y.o. male with history of [place] he had bacteremia, UTI admitted with obstructive Foley catheter.  He also sustained a fall with a left hip fracture and there was some concern for infection there.  The hip joint was aspirated but cell count not sent cultures were sent which have been unrevealing.  Urine yielded ESBL   #1 ESBL UTI:  Completed sufficient therapy  #2 ? Septic hip: cell would have been more helpful than the culture given he was already on antibiotics  Again the best option here is to observe him off of antibiotics and if he worsens he needs to be re-evaluated by Orthopedics with repeat aspiration OFF of antibiotics with cell count and diff, and culture  I think he can otherwise be safely DC to home  I spent 51 minutes with the patient including than 50% of the time in face to face counseling of the patient re his ESBL UTI, his ? Infected hip,  along with review of medical records in preparation for the visit and during the visit and in coordination of his care.   I will sign off for now.  Please call with further questions.   LOS: 6  days   Alcide Evener 03/15/2022, 12:42 PM

## 2022-03-15 NOTE — Progress Notes (Signed)
Patient can return to [facility]. Patient will be transported via [place] - RN to call transport and report when ready. The number to call for report is [phone].  Madilyn Fireman, MSW, LCSW Transitions of Care  Clinical Social Worker II [phone]

## 2022-03-17 ENCOUNTER — Encounter: Payer: Commercial Managed Care - HMO | Admitting: Physician Assistant

## 2022-03-17 ENCOUNTER — Other Ambulatory Visit: Payer: Commercial Managed Care - HMO

## 2022-03-20 ENCOUNTER — Telehealth: Payer: Self-pay | Admitting: Physician Assistant

## 2022-03-20 NOTE — Telephone Encounter (Signed)
Attempted to call patient, spoke with patient's sister. She provided the phone number for patient at facility. Called facility to notify of changed appointment but phone was not answered and there was not a voicemail. Mailing reminder to facility.

## 2022-03-27 ENCOUNTER — Telehealth: Payer: Self-pay

## 2022-03-27 NOTE — Telephone Encounter (Signed)
Received call from [facility] NP regarding cultures and sensitivity report faxed to office on 1/12. States that she started patient on Imipenem 500 mg every eight hours.  Would like to update provider and see if she agrees with plan. Will leave results in providers box to review.  If provider has questions can reach out to NP at [place], [place]

## 2022-03-31 ENCOUNTER — Encounter: Payer: Commercial Managed Care - HMO | Admitting: Physician Assistant

## 2022-03-31 ENCOUNTER — Other Ambulatory Visit: Payer: Commercial Managed Care - HMO

## 2022-04-04 ENCOUNTER — Telehealth: Payer: Self-pay | Admitting: Physician Assistant

## 2022-04-04 ENCOUNTER — Telehealth (HOSPITAL_COMMUNITY): Payer: Self-pay

## 2022-04-04 NOTE — Progress Notes (Signed)
[place] [phone]   [phone]  CONSULT NOTE  REFERRING PHYSICIAN: Dr. Candiss Norse  REASON FOR CONSULTATION:  Leukocytosis   HPI Charles [name] is a 62 y.o. male with multiple medical problems including chronic indwelling catheter, heart failure, dementia, diabetes, anemia of chronic disease, bacteremia, recurrent urinary tract infection platelets, hypertension, GI bleeding, diabetes, hip fractures, septic hip, pyelonephritis, and***is referred to the clinic for leukocytosis.  Numerous hospitalizations this past year. Several for catheter associated UTI   The patient is followed closely by infectious disease due to recurrent infections for bacteremia secondary to urinary tract infections.  He was admitted to [facility] from 11/16 to 11/20 for sepsis secondary to urinary tract infection.  The patient has a chronic indwelling Foley catheter secondary to history of recurrent UTI.  He follows with urology.  The patient had lab work performed on 02/20/2022 that showed white blood cell count elevated 18.5 predominantly neutrophils at 15.5.  The patient also has chronic anemia of chronic disease.  Due to the patient's elevated white blood cell count, and fatigue he was referred to the clinic to assess for underlying malignancy.  Shortly after being referred to the clinic, she was hospitalized from 03/08/2022 to 03/15/2022.  The patient is CT scan that showed acute pyelonephritis and multiple other abnormal findings including right hip fracture and fluid collection and increased lucencies in the right femoral region concerning for osteonecrosis versus infection is also several chronic anterior wedge compression fractures of thoracic and lumbar spine the patient also has chronic calcified pancreatitis.  The patient had repeat urine cultures that showed ESBL again.  The patient was started on meropenem.  The patient is expected to follow-up outpatient with  infectious disease on* *    There was also suspicion that the hip could be septic although this was sent for stain and culture and the aspirated material was limited.  Thus far this has remained negative.  He is going to follow-up outpatient with orthopedics about possible total hip replacement.  He follows with Dr. Lyla Glassing he sees them  ***  Of note, the patient has progressive neurological decline which reportedly has been thoroughly investigated without any apparent cause.  The patient was referred to the clinic for evaluation regarding her leukocytosis.   The oldest records available to me ar from 2020 and 2022 which are normal. He had some leukocytosis in Feburary 2023; however, he was hospitalized during this time for DKA and the leukocytosis improved the following month in March. He started developing anemia in March 2023. However, he was hospitalized for *** during this time. The lowest Hbg I see is 7.6 from 08/29/21 when the patient was hospitalized for GI bleeding and rectal bleeding 6/11 EGD normal. Colonoscopy showed hemorrhoid, polyp and solitary rectal ulcer. And diverticulosis.   The leukocytosis started in September 2023.   He had had some The patient has been having fluctuating leukocytosis more often than not since that time. The highest WBC is 28.1 when the patient was recently hospitalized in December 2023. Marland Kitchen    UTI sepsis in April 2023.   The patient denies any prednisone use.  The patient denies any fever, chills night sweats, or unexplained weight loss.  However, the patient has had malnutrition and failure to thrive. Besides ***, the patient denies frequent infections with sore throat, cough, diarrhea, skin infections, or dysuria.  Denies any lymphadenopathy.  Denies any nausea, vomiting, diarrhea, or constipation.  Denies any herbal supplement use.  Denies  any history of autoimmune disorders.  Denies any abnormal bleeding or bruising. Allergies.       HPI  Past  Medical History:  Diagnosis Date  . Arthritis   . Chronic diastolic heart failure ([place])   . Dementia ([place])   . Diabetes mellitus without complication ([place])   . Heart disease   . History of recurrent UTIs    (has chronic indwelling foley catheter)  . Hypertension     Past Surgical History:  Procedure Laterality Date  . COLONOSCOPY WITH PROPOFOL N/A 08/21/2021   Procedure: COLONOSCOPY WITH PROPOFOL;  Surgeon: Daryel November, MD;  Location: [place];  Service: Gastroenterology;  Laterality: N/A;  . CORONARY ARTERY BYPASS GRAFT    . ESOPHAGOGASTRODUODENOSCOPY (EGD) WITH PROPOFOL N/A 08/21/2021   Procedure: ESOPHAGOGASTRODUODENOSCOPY (EGD) WITH PROPOFOL;  Surgeon: Daryel November, MD;  Location: [place];  Service: Gastroenterology;  Laterality: N/A;  . POLYPECTOMY  08/21/2021   Procedure: POLYPECTOMY;  Surgeon: Daryel November, MD;  Location[facility] ENDOSCOPY;  Service: Gastroenterology;;    Family History  Problem Relation Age of Onset  . Diabetes Mother   . Hypertension Mother   . Diabetes Father   . Hypertension Father   . Heart attack Father   . Diabetes Sister   . Diabetes Sister     Social History Social History   Tobacco Use  . Smoking status: Never  . Smokeless tobacco: Never  Vaping Use  . Vaping Use: Never used  Substance Use Topics  . Alcohol use: Not Currently    Comment: none in 6 months may open a bottle of wine tonight 11/1112022  . Drug use: Never    No Known Allergies  Current Outpatient Medications  Medication Sig Dispense Refill  . aspirin EC 81 MG tablet Take 1 tablet (81 mg total) by mouth daily. Swallow whole. 30 tablet 11  . blood glucose meter kit and supplies Dispense based on patient and insurance preference. Use up to four times daily as directed. (FOR ICD-10 E10.9, E11.9). 1 each 11  . docusate sodium (COLACE) 100 MG capsule Take 100 mg by mouth at bedtime.    . dronabinol (MARINOL) 5 MG capsule Take 5 mg by mouth 2 (two)  times daily before a meal.    . ezetimibe (ZETIA) 10 MG tablet Take 1 tablet (10 mg total) by mouth daily.    . Glucerna (GLUCERNA) LIQD Take 237 mLs by mouth 3 (three) times daily with meals.    . insulin glargine-yfgn (SEMGLEE) 100 UNIT/ML injection Inject 0.18 mLs (18 Units total) into the skin daily. 10 mL 11  . insulin lispro (HUMALOG) 200 UNIT/ML KwikPen Inject into the skin in the morning, at noon, in the evening, and at bedtime. Inject as per sliding scale: if 121-150 = 3 units; 151-200 = 4 units; 201-250 = 7 units; 251-300 = 11 units; 301-350 = 15 units; 351-400 = 20 units. If CBG>400 call MD/NP if < 70 notify MD/NP    . loperamide (IMODIUM) 2 MG capsule Take 1 capsule (2 mg total) by mouth as needed for diarrhea or loose stools. 30 capsule 0  . magnesium oxide (MAG-OX) 400 (240 Mg) MG tablet Take 400 mg by mouth daily.    . methenamine (HIPREX) 1 g tablet Take 1 g by mouth 2 (two) times daily.    . midodrine (PROAMATINE) 10 MG tablet Take 1 tablet (10 mg total) by mouth 3 (three) times daily with meals.    . Multiple Vitamin (MULTIVITAMIN WITH  MINERALS) TABS tablet Take 1 tablet by mouth daily.    . NON FORMULARY Take 118 mLs by mouth See admin instructions. Magic Cup desert cup- Eat 1 "cup" (118 ml's) by mouth 2 times a day- with lunch and supper    . oxybutynin (DITROPAN) 5 MG tablet Take 5 mg by mouth 2 (two) times daily.    Marland Kitchen oxyCODONE (OXY IR/ROXICODONE) 5 MG immediate release tablet Take 1 tablet (5 mg total) by mouth every 4 (four) hours as needed for moderate pain. 5 tablet 0  . pantoprazole (PROTONIX) 40 MG tablet Take 1 tablet (40 mg total) by mouth daily. Any generic PPI is okay to dispense (Patient taking differently: Take 40 mg by mouth daily before breakfast.) 30 tablet 1  . polyethylene glycol (MIRALAX / GLYCOLAX) 17 g packet Take 17 g by mouth daily.    . rosuvastatin (CRESTOR) 20 MG tablet Take 20 mg by mouth daily.    Marland Kitchen thiamine 100 MG tablet Take 1 tablet (100 mg total)  by mouth daily.    Marland Kitchen tiZANidine (ZANAFLEX) 2 MG tablet Take 2 mg by mouth every 8 (eight) hours as needed for muscle spasms.    Marland Kitchen zinc oxide 20 % ointment Apply 1 Application topically 2 (two) times daily. Apply to scrotum topically every shift for excoriation     No current facility-administered medications for this visit.    REVIEW OF SYSTEMS:   Review of Systems  Constitutional: Negative for appetite change, chills, fatigue, fever and unexpected weight change.  HENT:   Negative for mouth sores, nosebleeds, sore throat and trouble swallowing.   Eyes: Negative for eye problems and icterus.  Respiratory: Negative for cough, hemoptysis, shortness of breath and wheezing.   Cardiovascular: Negative for chest pain and leg swelling.  Gastrointestinal: Negative for abdominal pain, constipation, diarrhea, nausea and vomiting.  Genitourinary: Negative for bladder incontinence, difficulty urinating, dysuria, frequency and hematuria.   Musculoskeletal: Negative for back pain, gait problem, neck pain and neck stiffness.  Skin: Negative for itching and rash.  Neurological: Negative for dizziness, extremity weakness, gait problem, headaches, light-headedness and seizures.  Hematological: Negative for adenopathy. Does not bruise/bleed easily.  Psychiatric/Behavioral: Negative for confusion, depression and sleep disturbance. The patient is not nervous/anxious.     PHYSICAL EXAMINATION:  There were no vitals taken for this visit.  ECOG PERFORMANCE STATUS: {CHL ONC ECOG [id number]  Physical Exam  Constitutional: Oriented to person, place, and time and well-developed, well-nourished, and in no distress. No distress.  HENT:  Head: Normocephalic and atraumatic.  Mouth/Throat: Oropharynx is clear and moist. No oropharyngeal exudate.  Eyes: Conjunctivae are normal. Right eye exhibits no discharge. Left eye exhibits no discharge. No scleral icterus.  Neck: Normal range of motion. Neck supple.   Cardiovascular: Normal rate, regular rhythm, normal heart sounds and intact distal pulses.   Pulmonary/Chest: Effort normal and breath sounds normal. No respiratory distress. No wheezes. No rales.  Abdominal: Soft. Bowel sounds are normal. Exhibits no distension and no mass. There is no tenderness.  Musculoskeletal: Normal range of motion. Exhibits no edema.  Lymphadenopathy:    No cervical adenopathy.  Neurological: Alert and oriented to person, place, and time. Exhibits normal muscle tone. Gait normal. Coordination normal.  Skin: Skin is warm and dry. No rash noted. Not diaphoretic. No erythema. No pallor.  Psychiatric: Mood, memory and judgment normal.  Vitals reviewed.  LABORATORY DATA: Lab Results  Component Value Date   WBC 22.6 (H) 03/15/2022   HGB 9.8 (L) 03/15/2022  HCT 30.4 (L) 03/15/2022   MCV 82.6 03/15/2022   PLT 425 (H) 03/15/2022      Chemistry      Component Value Date/Time   NA 132 (L) 03/15/2022 0238   NA 135 12/23/2020 1140   K 3.6 03/15/2022 0238   CL 98 03/15/2022 0238   CO2 23 03/15/2022 0238   BUN 13 03/15/2022 0238   BUN 14 12/23/2020 1140   CREATININE 0.65 03/15/2022 0238   CREATININE 0.66 (L) 02/20/2022 0928      Component Value Date/Time   CALCIUM 8.4 (L) 03/15/2022 0238   ALKPHOS 90 03/08/2022 0301   AST 24 03/08/2022 0301   ALT 17 03/08/2022 0301   BILITOT 0.5 03/08/2022 0301       RADIOGRAPHIC STUDIES: MR Lumbar Spine W Wo Contrast  Result Date: 03/11/2022 CLINICAL DATA:  Low back pain, infection suspected EXAM: MRI LUMBAR SPINE WITHOUT AND WITH CONTRAST TECHNIQUE: Multiplanar and multiecho pulse sequences of the lumbar spine were obtained without and with intravenous contrast. CONTRAST:  32mL GADAVIST GADOBUTROL 1 MMOL/ML IV SOLN COMPARISON:  Lumbar spine MRI 04/12/2021 CT abdomen pelvis 08/19/2021 FINDINGS: Segmentation:  Standard Alignment:  Normal Vertebrae: Superior endplate Schmorl's nodes at L3 and L5 present on CT abdomen pelvis  08/19/2021. No acute abnormality. No discitis-osteomyelitis. Conus medullaris and cauda equina: Conus extends to the L1 level. Conus and cauda equina appear normal. Paraspinal and other soft tissues: Large amount of stool in the colon. Disc levels: T12-L1: Normal. L1-L2: Normal disc space and facet joints. No spinal canal stenosis. No neural foraminal stenosis. L2-L3: Normal disc space and facet joints. No spinal canal stenosis. No neural foraminal stenosis. L3-L4: Normal disc space and facet joints. No spinal canal stenosis. No neural foraminal stenosis. L4-L5: Small disc bulge. Normal facets. No spinal canal stenosis. Mild left neural foraminal stenosis. L5-S1: Normal disc space and facet joints. No spinal canal stenosis. No neural foraminal stenosis. Visualized sacrum: Normal. IMPRESSION: 1. No discitis-osteomyelitis or epidural abscess. 2. Mild left L4-5 neural foraminal stenosis. 3. Large amount of stool in the colon. Correlate for symptoms of constipation. Electronically Signed   By: Deatra Robinson M.D.   On: 03/11/2022 00:38   CT GUIDED NEEDLE PLACEMENT  Result Date: 03/10/2022 INDICATION: Right hip nonunion fracture with enlarging complex effusion EXAM: CT ASPIRATION RIGHT HIP JOINT MEDICATIONS: 1% lidocaine local ANESTHESIA/SEDATION: None. COMPLICATIONS: None immediate. PROCEDURE: Informed written consent was obtained from the patient after a thorough discussion of the procedural risks, benefits and alternatives. All questions were addressed. Maximal Sterile Barrier Technique was utilized including caps, mask, sterile gowns, sterile gloves, sterile drape, hand hygiene and skin antiseptic. A timeout was performed prior to the initiation of the procedure. Previous imaging reviewed. Patient positioned supine. Noncontrast localization CT performed. The complex right hip joint effusion was localized and marked. This was correlated with the recent CT 2 days ago. Under sterile conditions and local anesthesia,  an 18 gauge needle was advanced into the hip joint fluid along the nonunion fracture. Needle position confirmed with CT. Syringe aspiration and needle manipulation yielded 2 cc clear synovial fluid. Sample sent for laboratory analysis and culture as ordered. Needle removed. Patient tolerated procedure well. No immediate complication. IMPRESSION: Successful CT-guided right hip joint aspiration. Electronically Signed   By: Judie Petit.  Shick M.D.   On: 03/10/2022 10:24   CT ABDOMEN PELVIS W CONTRAST  Result Date: 03/08/2022 CLINICAL DATA:  Abdominal pain. EXAM: CT ABDOMEN AND PELVIS WITH CONTRAST TECHNIQUE: Multidetector CT imaging of the abdomen  and pelvis was performed using the standard protocol following bolus administration of intravenous contrast. RADIATION DOSE REDUCTION: This exam was performed according to the departmental dose-optimization program which includes automated exposure control, adjustment of the mA and/or kV according to patient size and/or use of iterative reconstruction technique. CONTRAST:  65mL OMNIPAQUE IOHEXOL 350 MG/ML SOLN COMPARISON:  CT without contrast 08/19/2021, CT with IV contrast 07/03/2021 FINDINGS: Lower chest: The cardiac size is normal. There are three-vessel coronary artery calcifications. No pericardial effusion. Lung bases are clear. Hepatobiliary: No focal liver abnormality is seen. No calcified gallstones, gallbladder wall thickening, or biliary dilatation. Pancreas: There are calcifications in the pancreatic head consistent with chronic calcific pancreatitis. There is no evidence of acute inflammatory change, mass enhancement or ductal dilatation. Spleen: Normal. Adrenals/Urinary Tract: There is no adrenal mass. There is faint patchy hypoenhancement right kidney most likely due to pyelonephritis with asymmetric perinephric stranding. There is no urinary stone or hydroureteronephrosis. No renal mass enhancement. There is a 5 mm hypodensity in the inferior pole of the left  kidney which is too small to characterize but most likely representing a cyst. No follow-up imaging is recommended. No findings of left-sided pyelonephritis. The bladder is catheterized but again appears diffusely thickened, which was seen previously. Stomach/Bowel: There is fold thickening of the proximal to mid stomach and in some of the left abdominal small bowel. There is no small bowel dilatation or inflammation. The appendix is normal. There is moderate diffuse retained stool. Scattered diverticula noted without evidence of acute diverticulitis. Vascular/Lymphatic: Aortic atherosclerosis. No enlarged abdominal or pelvic lymph nodes. Reproductive: Mild prostatomegaly. TURP defect appears new from 08/19/2021. Other: There is minimal ascites in the posterior pelvis. Diffuse mesenteric haziness. This could be congestive or due to malnutrition and/or hepatic dysfunction. There is no free hemorrhage, free air or focal acute inflammatory change. Musculoskeletal: First seen on 08/19/2021, again noted is a subcapital right femoral fracture with the distal fragment increasingly moderately cephalad displaced, multiple heterotopic bone formations in the area, and moderate fluid in joint space with bony debris. There are increased lucencies in right femoral which could be due to evolution of osteonecrosis of the femoral head or could be on an infectious basis. There are small scattered erosions of right femoral head articular surface but not of the acetabulum. There are mild chronic upper plate anterior wedge compression fractures of the T12, L1 and L3 vertebral bodies, mild degenerative changes of the spine. There are bridging osteophytes of the SI joints. IMPRESSION: 1. Faint patchy hypoenhancement of the right kidney most likely due to pyelonephritis with asymmetric perinephric stranding. No hydronephrosis or urinary stone. 2. Cystitis versus bladder nondistention. 3. Constipation and diverticulosis. 4. Aortic and  coronary artery atherosclerosis. 5. Diffuse mesenteric haziness which could be congestive or due to malnutrition and/or hepatic dysfunction. Minimal ascites in the posterior pelvis. 6. Chronic calcific pancreatitis, only seen in the pancreatic head. No acute inflammatory change. 7. Subcapital right femoral fracture, first seen on CT abdomen and pelvis 08/19/2021, with increasingly moderately superiorly displaced distal fragment, multiple heterotopic bone formations in the area, and moderate fluid in the joint space with bony debris. 8. Increased lucencies in the right femoral head are noted today which could be due to evolution of osteonecrosis or could be on an infectious basis. 9. Mild prostatomegaly with new TURP defect. 10. Chronic mild upper plate anterior wedge compression fractures of the T12, L1 and L3 vertebral bodies. Aortic Atherosclerosis (ICD10-I70.0). Electronically Signed   By: Earlean ShawlKeith  Chesser M.D.  On: 03/08/2022 06:28    ASSESSMENT: This is a very pleasant 62 year old male referred to the clinic for leukocytosis.  The patient has a history of recurrent UTIs.  He is also been hospitalized several times (11/16-11/20) for sepsis and bacteremia secondary to urinary tract infection  The patient has several comorbidities and hospitalizations.  The patient was seen with Dr. Arbutus Ped today.  Dr. Arbutus Ped feels that it is unlikely that the patient's leukocytosis is secondary to myeloproliferative disorder and is likely reactive in the setting of recent bacteremia, recurrent infections, and***.  Will arrange for BCR/ABL testing to rule out myeloproliferative disorder.  Dr. Arbutus Ped does not recommend any follow-up appointments and recommended treatment of underlying inflammatory condition and infection and to continue to follow with his infectious disease specialist.  Of course would be happy to see the patient on an as-needed basis.  The patient voices understanding of current disease status and  treatment options and is in agreement with the current care plan.  All questions were answered. The patient knows to call the clinic with any problems, questions or concerns. We can certainly see the patient much sooner if necessary.  Thank you so much for allowing me to participate in the care of [facility]. I will continue to follow up the patient with you and assist in his care.  I spent {CHL ONC TIME VISIT - FYBOF:[id number]} counseling the patient face to face. The total time spent in the appointment was {CHL ONC TIME VISIT - POEUM:[id number]}.  Disclaimer: This note was dictated with voice recognition software. Similar sounding words can inadvertently be transcribed and may not be corrected upon review.   Luwanda Starr L Aunna Snooks April 04, 2022, 6:19 PM

## 2022-04-04 NOTE — Telephone Encounter (Signed)
Spoke with the rehab center about patient coming in earlier tomorrow for appointment. They told me they would call m back if it do not work.

## 2022-04-05 ENCOUNTER — Other Ambulatory Visit: Payer: Self-pay | Admitting: Physician Assistant

## 2022-04-05 ENCOUNTER — Inpatient Hospital Stay: Payer: Medicaid Other | Attending: Physician Assistant | Admitting: Physician Assistant

## 2022-04-05 ENCOUNTER — Encounter: Payer: Commercial Managed Care - HMO | Admitting: Physician Assistant

## 2022-04-05 ENCOUNTER — Other Ambulatory Visit: Payer: Commercial Managed Care - HMO

## 2022-04-05 ENCOUNTER — Inpatient Hospital Stay: Payer: Medicaid Other

## 2022-04-05 VITALS — BP 87/59 | HR 69 | Temp 97.8°F | Resp 15 | Ht 66.0 in

## 2022-04-05 DIAGNOSIS — D72819 Decreased white blood cell count, unspecified: Secondary | ICD-10-CM

## 2022-04-05 DIAGNOSIS — D72829 Elevated white blood cell count, unspecified: Secondary | ICD-10-CM

## 2022-04-05 DIAGNOSIS — Z87440 Personal history of urinary (tract) infections: Secondary | ICD-10-CM | POA: Insufficient documentation

## 2022-04-05 DIAGNOSIS — I959 Hypotension, unspecified: Secondary | ICD-10-CM | POA: Diagnosis not present

## 2022-04-05 DIAGNOSIS — E876 Hypokalemia: Secondary | ICD-10-CM | POA: Diagnosis not present

## 2022-04-05 DIAGNOSIS — D509 Iron deficiency anemia, unspecified: Secondary | ICD-10-CM | POA: Diagnosis not present

## 2022-04-05 DIAGNOSIS — D649 Anemia, unspecified: Secondary | ICD-10-CM

## 2022-04-05 LAB — CBC WITH DIFFERENTIAL (CANCER CENTER ONLY)
Abs Immature Granulocytes: 0.03 10*3/uL (ref 0.00–0.07)
Basophils Absolute: 0.1 10*3/uL (ref 0.0–0.1)
Basophils Relative: 0 %
Eosinophils Absolute: 0.3 10*3/uL (ref 0.0–0.5)
Eosinophils Relative: 3 %
HCT: 29.3 % — ABNORMAL LOW (ref 39.0–52.0)
Hemoglobin: 9.5 g/dL — ABNORMAL LOW (ref 13.0–17.0)
Immature Granulocytes: 0 %
Lymphocytes Relative: 16 %
Lymphs Abs: 1.7 10*3/uL (ref 0.7–4.0)
MCH: 26.5 pg (ref 26.0–34.0)
MCHC: 32.4 g/dL (ref 30.0–36.0)
MCV: 81.6 fL (ref 80.0–100.0)
Monocytes Absolute: 0.8 10*3/uL (ref 0.1–1.0)
Monocytes Relative: 7 %
Neutro Abs: 8.2 10*3/uL — ABNORMAL HIGH (ref 1.7–7.7)
Neutrophils Relative %: 74 %
Platelet Count: 292 10*3/uL (ref 150–400)
RBC: 3.59 MIL/uL — ABNORMAL LOW (ref 4.22–5.81)
RDW: 16.8 % — ABNORMAL HIGH (ref 11.5–15.5)
WBC Count: 11.1 10*3/uL — ABNORMAL HIGH (ref 4.0–10.5)
nRBC: 0 % (ref 0.0–0.2)

## 2022-04-05 LAB — IRON AND IRON BINDING CAPACITY (CC-WL,HP ONLY)
Iron: 38 ug/dL — ABNORMAL LOW (ref 45–182)
Saturation Ratios: 15 % — ABNORMAL LOW (ref 17.9–39.5)
TIBC: 246 ug/dL — ABNORMAL LOW (ref 250–450)
UIBC: 208 ug/dL

## 2022-04-05 LAB — CMP (CANCER CENTER ONLY)
ALT: 26 U/L (ref 0–44)
AST: 76 U/L — ABNORMAL HIGH (ref 15–41)
Albumin: 2.2 g/dL — ABNORMAL LOW (ref 3.5–5.0)
Alkaline Phosphatase: 95 U/L (ref 38–126)
Anion gap: 6 (ref 5–15)
BUN: 11 mg/dL (ref 6–20)
CO2: 28 mmol/L (ref 22–32)
Calcium: 8.2 mg/dL — ABNORMAL LOW (ref 8.9–10.3)
Chloride: 104 mmol/L (ref 98–111)
Creatinine: 0.57 mg/dL — ABNORMAL LOW (ref 0.61–1.24)
GFR, Estimated: >60 mL/min (ref >60)
Glucose, Bld: 136 mg/dL — ABNORMAL HIGH (ref 70–99)
Potassium: 3.1 mmol/L — ABNORMAL LOW (ref 3.5–5.1)
Sodium: 138 mmol/L (ref 135–145)
Total Bilirubin: 0.2 mg/dL — ABNORMAL LOW (ref 0.3–1.2)
Total Protein: 6.7 g/dL (ref 6.5–8.1)

## 2022-04-05 LAB — LACTATE DEHYDROGENASE: LDH: 115 U/L (ref 98–192)

## 2022-04-05 LAB — FOLATE: Folate: 8.9 ng/mL (ref >5.9)

## 2022-04-05 LAB — FERRITIN: Ferritin: 80 ng/mL (ref 24–336)

## 2022-04-05 LAB — VITAMIN B12: Vitamin B-12: 1199 pg/mL — ABNORMAL HIGH (ref 180–914)

## 2022-04-05 MED ORDER — INTEGRA PLUS PO CAPS: 1.0000 | ORAL_CAPSULE | Freq: Every morning | ORAL | 2 refills | Status: AC

## 2022-04-05 MED ORDER — POTASSIUM CHLORIDE CRYS ER 20 MEQ PO TBCR: 20.0000 meq | EXTENDED_RELEASE_TABLET | Freq: Two times a day (BID) | ORAL | 0 refills | Status: AC

## 2022-04-10 ENCOUNTER — Inpatient Hospital Stay (HOSPITAL_COMMUNITY)
Admission: EM | Admit: 2022-04-10 | Discharge: 2022-05-12 | DRG: 698 | Disposition: E | Payer: 59 | Source: Skilled Nursing Facility | Attending: Emergency Medicine | Admitting: Emergency Medicine

## 2022-04-10 ENCOUNTER — Emergency Department (HOSPITAL_COMMUNITY): Payer: 59

## 2022-04-10 ENCOUNTER — Encounter (HOSPITAL_COMMUNITY): Payer: Self-pay

## 2022-04-10 ENCOUNTER — Inpatient Hospital Stay (HOSPITAL_COMMUNITY): Payer: 59

## 2022-04-10 DIAGNOSIS — E119 Type 2 diabetes mellitus without complications: Secondary | ICD-10-CM

## 2022-04-10 DIAGNOSIS — Z1612 Extended spectrum beta lactamase (ESBL) resistance: Secondary | ICD-10-CM | POA: Diagnosis present

## 2022-04-10 DIAGNOSIS — J690 Pneumonitis due to inhalation of food and vomit: Secondary | ICD-10-CM | POA: Diagnosis present

## 2022-04-10 DIAGNOSIS — Z7982 Long term (current) use of aspirin: Secondary | ICD-10-CM

## 2022-04-10 DIAGNOSIS — D638 Anemia in other chronic diseases classified elsewhere: Secondary | ICD-10-CM | POA: Diagnosis present

## 2022-04-10 DIAGNOSIS — Z681 Body mass index (BMI) 19 or less, adult: Secondary | ICD-10-CM | POA: Diagnosis not present

## 2022-04-10 DIAGNOSIS — N401 Enlarged prostate with lower urinary tract symptoms: Secondary | ICD-10-CM | POA: Diagnosis present

## 2022-04-10 DIAGNOSIS — I5032 Chronic diastolic (congestive) heart failure: Secondary | ICD-10-CM | POA: Diagnosis present

## 2022-04-10 DIAGNOSIS — A419 Sepsis, unspecified organism: Secondary | ICD-10-CM | POA: Diagnosis not present

## 2022-04-10 DIAGNOSIS — Z87440 Personal history of urinary (tract) infections: Secondary | ICD-10-CM

## 2022-04-10 DIAGNOSIS — Z7189 Other specified counseling: Secondary | ICD-10-CM | POA: Diagnosis not present

## 2022-04-10 DIAGNOSIS — J9601 Acute respiratory failure with hypoxia: Secondary | ICD-10-CM | POA: Diagnosis present

## 2022-04-10 DIAGNOSIS — E871 Hypo-osmolality and hyponatremia: Secondary | ICD-10-CM | POA: Diagnosis not present

## 2022-04-10 DIAGNOSIS — Z1152 Encounter for screening for COVID-19: Secondary | ICD-10-CM

## 2022-04-10 DIAGNOSIS — R64 Cachexia: Secondary | ICD-10-CM | POA: Diagnosis present

## 2022-04-10 DIAGNOSIS — E8809 Other disorders of plasma-protein metabolism, not elsewhere classified: Secondary | ICD-10-CM | POA: Diagnosis not present

## 2022-04-10 DIAGNOSIS — I2489 Other forms of acute ischemic heart disease: Secondary | ICD-10-CM | POA: Diagnosis present

## 2022-04-10 DIAGNOSIS — I2510 Atherosclerotic heart disease of native coronary artery without angina pectoris: Secondary | ICD-10-CM | POA: Diagnosis present

## 2022-04-10 DIAGNOSIS — Z794 Long term (current) use of insulin: Secondary | ICD-10-CM

## 2022-04-10 DIAGNOSIS — Y846 Urinary catheterization as the cause of abnormal reaction of the patient, or of later complication, without mention of misadventure at the time of the procedure: Secondary | ICD-10-CM | POA: Diagnosis present

## 2022-04-10 DIAGNOSIS — T83511A Infection and inflammatory reaction due to indwelling urethral catheter, initial encounter: Secondary | ICD-10-CM | POA: Diagnosis present

## 2022-04-10 DIAGNOSIS — J189 Pneumonia, unspecified organism: Secondary | ICD-10-CM | POA: Diagnosis not present

## 2022-04-10 DIAGNOSIS — Z66 Do not resuscitate: Secondary | ICD-10-CM | POA: Diagnosis present

## 2022-04-10 DIAGNOSIS — G9341 Metabolic encephalopathy: Secondary | ICD-10-CM | POA: Diagnosis present

## 2022-04-10 DIAGNOSIS — A4151 Sepsis due to Escherichia coli [E. coli]: Secondary | ICD-10-CM | POA: Diagnosis present

## 2022-04-10 DIAGNOSIS — N179 Acute kidney failure, unspecified: Secondary | ICD-10-CM | POA: Diagnosis present

## 2022-04-10 DIAGNOSIS — R410 Disorientation, unspecified: Secondary | ICD-10-CM | POA: Diagnosis present

## 2022-04-10 DIAGNOSIS — L89152 Pressure ulcer of sacral region, stage 2: Secondary | ICD-10-CM | POA: Diagnosis present

## 2022-04-10 DIAGNOSIS — Z515 Encounter for palliative care: Secondary | ICD-10-CM

## 2022-04-10 DIAGNOSIS — I10 Essential (primary) hypertension: Secondary | ICD-10-CM | POA: Diagnosis present

## 2022-04-10 DIAGNOSIS — R6521 Severe sepsis with septic shock: Secondary | ICD-10-CM | POA: Diagnosis present

## 2022-04-10 DIAGNOSIS — R451 Restlessness and agitation: Secondary | ICD-10-CM | POA: Diagnosis not present

## 2022-04-10 DIAGNOSIS — E43 Unspecified severe protein-calorie malnutrition: Secondary | ICD-10-CM | POA: Diagnosis present

## 2022-04-10 DIAGNOSIS — R338 Other retention of urine: Secondary | ICD-10-CM | POA: Diagnosis present

## 2022-04-10 DIAGNOSIS — I110 Hypertensive heart disease with heart failure: Secondary | ICD-10-CM | POA: Diagnosis present

## 2022-04-10 DIAGNOSIS — Z833 Family history of diabetes mellitus: Secondary | ICD-10-CM

## 2022-04-10 DIAGNOSIS — E785 Hyperlipidemia, unspecified: Secondary | ICD-10-CM | POA: Diagnosis present

## 2022-04-10 DIAGNOSIS — N12 Tubulo-interstitial nephritis, not specified as acute or chronic: Secondary | ICD-10-CM | POA: Diagnosis present

## 2022-04-10 DIAGNOSIS — R0609 Other forms of dyspnea: Secondary | ICD-10-CM | POA: Diagnosis not present

## 2022-04-10 DIAGNOSIS — F0390 Unspecified dementia without behavioral disturbance: Secondary | ICD-10-CM | POA: Diagnosis present

## 2022-04-10 DIAGNOSIS — T68XXXA Hypothermia, initial encounter: Secondary | ICD-10-CM | POA: Diagnosis present

## 2022-04-10 DIAGNOSIS — Z951 Presence of aortocoronary bypass graft: Secondary | ICD-10-CM

## 2022-04-10 DIAGNOSIS — R627 Adult failure to thrive: Secondary | ICD-10-CM | POA: Diagnosis present

## 2022-04-10 DIAGNOSIS — Z8249 Family history of ischemic heart disease and other diseases of the circulatory system: Secondary | ICD-10-CM

## 2022-04-10 DIAGNOSIS — E1165 Type 2 diabetes mellitus with hyperglycemia: Secondary | ICD-10-CM | POA: Diagnosis not present

## 2022-04-10 DIAGNOSIS — E876 Hypokalemia: Secondary | ICD-10-CM | POA: Diagnosis present

## 2022-04-10 DIAGNOSIS — D6959 Other secondary thrombocytopenia: Secondary | ICD-10-CM | POA: Diagnosis present

## 2022-04-10 DIAGNOSIS — Z79899 Other long term (current) drug therapy: Secondary | ICD-10-CM

## 2022-04-10 DIAGNOSIS — M1990 Unspecified osteoarthritis, unspecified site: Secondary | ICD-10-CM | POA: Diagnosis present

## 2022-04-10 DIAGNOSIS — L8990 Pressure ulcer of unspecified site, unspecified stage: Secondary | ICD-10-CM | POA: Diagnosis present

## 2022-04-10 LAB — CBC
HCT: 28.2 % — ABNORMAL LOW (ref 39.0–52.0)
Hemoglobin: 8.9 g/dL — ABNORMAL LOW (ref 13.0–17.0)
MCH: 27.1 pg (ref 26.0–34.0)
MCHC: 31.6 g/dL (ref 30.0–36.0)
MCV: 85.7 fL (ref 80.0–100.0)
Platelets: 158 10*3/uL (ref 150–400)
RBC: 3.29 MIL/uL — ABNORMAL LOW (ref 4.22–5.81)
RDW: 18.2 % — ABNORMAL HIGH (ref 11.5–15.5)
WBC: 37 10*3/uL — ABNORMAL HIGH (ref 4.0–10.5)
nRBC: 0 % (ref 0.0–0.2)

## 2022-04-10 LAB — I-STAT ARTERIAL BLOOD GAS, ED
Acid-base deficit: 7 mmol/L — ABNORMAL HIGH (ref 0.0–2.0)
Bicarbonate: 17.1 mmol/L — ABNORMAL LOW (ref 20.0–28.0)
Calcium, Ion: 1.18 mmol/L (ref 1.15–1.40)
HCT: 22 % — ABNORMAL LOW (ref 39.0–52.0)
Hemoglobin: 7.5 g/dL — ABNORMAL LOW (ref 13.0–17.0)
O2 Saturation: 100 %
Patient temperature: 98.6
Potassium: 3.2 mmol/L — ABNORMAL LOW (ref 3.5–5.1)
Sodium: 136 mmol/L (ref 135–145)
TCO2: 18 mmol/L — ABNORMAL LOW (ref 22–32)
pCO2 arterial: 27.6 mmHg — ABNORMAL LOW (ref 32–48)
pH, Arterial: 7.399 (ref 7.35–7.45)
pO2, Arterial: 463 mmHg — ABNORMAL HIGH (ref 83–108)

## 2022-04-10 LAB — I-STAT CHEM 8, ED
BUN: 11 mg/dL (ref 6–20)
Calcium, Ion: 1.15 mmol/L (ref 1.15–1.40)
Chloride: 104 mmol/L (ref 98–111)
Creatinine, Ser: 0.7 mg/dL (ref 0.61–1.24)
Glucose, Bld: 86 mg/dL (ref 70–99)
HCT: 28 % — ABNORMAL LOW (ref 39.0–52.0)
Hemoglobin: 9.5 g/dL — ABNORMAL LOW (ref 13.0–17.0)
Potassium: 3.2 mmol/L — ABNORMAL LOW (ref 3.5–5.1)
Sodium: 139 mmol/L (ref 135–145)
TCO2: 17 mmol/L — ABNORMAL LOW (ref 22–32)

## 2022-04-10 LAB — CBG MONITORING, ED: Glucose-Capillary: 380 mg/dL — ABNORMAL HIGH (ref 70–99)

## 2022-04-10 LAB — COMPREHENSIVE METABOLIC PANEL
ALT: 13 U/L (ref 0–44)
AST: 41 U/L (ref 15–41)
Albumin: 1.7 g/dL — ABNORMAL LOW (ref 3.5–5.0)
Alkaline Phosphatase: 159 U/L — ABNORMAL HIGH (ref 38–126)
Anion gap: 17 — ABNORMAL HIGH (ref 5–15)
BUN: 12 mg/dL (ref 6–20)
CO2: 17 mmol/L — ABNORMAL LOW (ref 22–32)
Calcium: 8.1 mg/dL — ABNORMAL LOW (ref 8.9–10.3)
Chloride: 103 mmol/L (ref 98–111)
Creatinine, Ser: 0.99 mg/dL (ref 0.61–1.24)
GFR, Estimated: >60 mL/min (ref >60)
Glucose, Bld: 86 mg/dL (ref 70–99)
Potassium: 3.2 mmol/L — ABNORMAL LOW (ref 3.5–5.1)
Sodium: 137 mmol/L (ref 135–145)
Total Bilirubin: 0.6 mg/dL (ref 0.3–1.2)
Total Protein: 5.5 g/dL — ABNORMAL LOW (ref 6.5–8.1)

## 2022-04-10 LAB — CBC WITH DIFFERENTIAL/PLATELET
Abs Immature Granulocytes: 0.04 10*3/uL (ref 0.00–0.07)
Basophils Absolute: 0 10*3/uL (ref 0.0–0.1)
Basophils Relative: 0 %
Eosinophils Absolute: 0 10*3/uL (ref 0.0–0.5)
Eosinophils Relative: 0 %
HCT: 28.8 % — ABNORMAL LOW (ref 39.0–52.0)
Hemoglobin: 9 g/dL — ABNORMAL LOW (ref 13.0–17.0)
Immature Granulocytes: 1 %
Lymphocytes Relative: 4 %
Lymphs Abs: 0.3 10*3/uL — ABNORMAL LOW (ref 0.7–4.0)
MCH: 26.9 pg (ref 26.0–34.0)
MCHC: 31.3 g/dL (ref 30.0–36.0)
MCV: 86 fL (ref 80.0–100.0)
Monocytes Absolute: 0.1 10*3/uL (ref 0.1–1.0)
Monocytes Relative: 1 %
Neutro Abs: 6.2 10*3/uL (ref 1.7–7.7)
Neutrophils Relative %: 94 %
Platelets: 218 10*3/uL (ref 150–400)
RBC: 3.35 MIL/uL — ABNORMAL LOW (ref 4.22–5.81)
RDW: 18.3 % — ABNORMAL HIGH (ref 11.5–15.5)
WBC: 6.6 10*3/uL (ref 4.0–10.5)
nRBC: 0.3 % — ABNORMAL HIGH (ref 0.0–0.2)

## 2022-04-10 LAB — PROTIME-INR
INR: 1.2 (ref 0.8–1.2)
Prothrombin Time: 15.2 seconds (ref 11.4–15.2)

## 2022-04-10 LAB — I-STAT VENOUS BLOOD GAS, ED
Acid-base deficit: 8 mmol/L — ABNORMAL HIGH (ref 0.0–2.0)
Bicarbonate: 17.8 mmol/L — ABNORMAL LOW (ref 20.0–28.0)
Calcium, Ion: 1.15 mmol/L (ref 1.15–1.40)
HCT: 27 % — ABNORMAL LOW (ref 39.0–52.0)
Hemoglobin: 9.2 g/dL — ABNORMAL LOW (ref 13.0–17.0)
O2 Saturation: 92 %
Potassium: 3.2 mmol/L — ABNORMAL LOW (ref 3.5–5.1)
Sodium: 138 mmol/L (ref 135–145)
TCO2: 19 mmol/L — ABNORMAL LOW (ref 22–32)
pCO2, Ven: 35 mmHg — ABNORMAL LOW (ref 44–60)
pH, Ven: 7.315 (ref 7.25–7.43)
pO2, Ven: 69 mmHg — ABNORMAL HIGH (ref 32–45)

## 2022-04-10 LAB — URINALYSIS, ROUTINE W REFLEX MICROSCOPIC
Bilirubin Urine: NEGATIVE
Glucose, UA: 50 mg/dL — AB
Ketones, ur: NEGATIVE mg/dL
Nitrite: NEGATIVE
Protein, ur: 100 mg/dL — AB
RBC / HPF: >50 RBC/hpf (ref 0–5)
Specific Gravity, Urine: 1.011 (ref 1.005–1.030)
WBC, UA: >50 WBC/hpf (ref 0–5)
pH: 6 (ref 5.0–8.0)

## 2022-04-10 LAB — RESP PANEL BY RT-PCR (RSV, FLU A&B, COVID)  RVPGX2
Influenza A by PCR: NEGATIVE
Influenza B by PCR: NEGATIVE
Resp Syncytial Virus by PCR: NEGATIVE
SARS Coronavirus 2 by RT PCR: NEGATIVE

## 2022-04-10 LAB — CREATININE, SERUM
Creatinine, Ser: 0.99 mg/dL (ref 0.61–1.24)
GFR, Estimated: >60 mL/min (ref >60)

## 2022-04-10 LAB — MAGNESIUM: Magnesium: 1.7 mg/dL (ref 1.7–2.4)

## 2022-04-10 LAB — TROPONIN I (HIGH SENSITIVITY)
Troponin I (High Sensitivity): 35 ng/L — ABNORMAL HIGH (ref <18)
Troponin I (High Sensitivity): 154 ng/L (ref <18)
Troponin I (High Sensitivity): 314 ng/L (ref <18)

## 2022-04-10 LAB — GLUCOSE, CAPILLARY
Glucose-Capillary: 286 mg/dL — ABNORMAL HIGH (ref 70–99)
Glucose-Capillary: 223 mg/dL — ABNORMAL HIGH (ref 70–99)

## 2022-04-10 LAB — PROCALCITONIN: Procalcitonin: 6.17 ng/mL

## 2022-04-10 LAB — LACTIC ACID, PLASMA
Lactic Acid, Venous: 7.8 mmol/L (ref 0.5–1.9)
Lactic Acid, Venous: 5.7 mmol/L (ref 0.5–1.9)

## 2022-04-10 MED ORDER — VANCOMYCIN HCL IN DEXTROSE 1-5 GM/200ML-% IV SOLN
1000.0000 mg | Freq: Once | INTRAVENOUS | Status: AC
  Administered 2022-04-10: 1000 mg via INTRAVENOUS
  Filled 2022-04-10: qty 200

## 2022-04-10 MED ORDER — NOREPINEPHRINE 4 MG/250ML-% IV SOLN
INTRAVENOUS | Status: AC
  Administered 2022-04-10: 15 ug/min via INTRAVENOUS
  Filled 2022-04-10: qty 250

## 2022-04-10 MED ORDER — LACTATED RINGERS IV SOLN: INTRAVENOUS | Status: DC

## 2022-04-10 MED ORDER — VANCOMYCIN HCL 500 MG/100ML IV SOLN
500.0000 mg | Freq: Two times a day (BID) | INTRAVENOUS | Status: DC
  Administered 2022-04-10 – 2022-04-11 (×2): 500 mg via INTRAVENOUS
  Filled 2022-04-10 (×3): qty 100

## 2022-04-10 MED ORDER — SODIUM CHLORIDE 0.9 % IV BOLUS (SEPSIS)
250.0000 mL | Freq: Once | INTRAVENOUS | Status: AC
  Administered 2022-04-10: 250 mL via INTRAVENOUS

## 2022-04-10 MED ORDER — SODIUM CHLORIDE 0.9 % IV SOLN: 250.0000 mL | INTRAVENOUS | Status: DC

## 2022-04-10 MED ORDER — METRONIDAZOLE 500 MG/100ML IV SOLN
500.0000 mg | Freq: Once | INTRAVENOUS | Status: AC
  Administered 2022-04-10: 500 mg via INTRAVENOUS
  Filled 2022-04-10: qty 100

## 2022-04-10 MED ORDER — SODIUM CHLORIDE 0.9 % IV BOLUS (SEPSIS)
1000.0000 mL | Freq: Once | INTRAVENOUS | Status: AC
  Administered 2022-04-10: 1000 mL via INTRAVENOUS

## 2022-04-10 MED ORDER — ORAL CARE MOUTH RINSE: 15.0000 mL | OROMUCOSAL | Status: DC | PRN

## 2022-04-10 MED ORDER — MIDAZOLAM BOLUS VIA INFUSION: 0.0000 mg | INTRAVENOUS | Status: DC | PRN

## 2022-04-10 MED ORDER — ORAL CARE MOUTH RINSE
15.0000 mL | OROMUCOSAL | Status: DC
  Administered 2022-04-10 – 2022-04-17 (×77): 15 mL via OROMUCOSAL

## 2022-04-10 MED ORDER — FAMOTIDINE 20 MG PO TABS
20.0000 mg | ORAL_TABLET | Freq: Two times a day (BID) | ORAL | Status: DC
  Administered 2022-04-10 – 2022-04-17 (×14): 20 mg
  Filled 2022-04-10 (×14): qty 1

## 2022-04-10 MED ORDER — NOREPINEPHRINE 16 MG/250ML-% IV SOLN
0.0000 ug/min | INTRAVENOUS | Status: DC
  Administered 2022-04-10: 38 ug/min via INTRAVENOUS
  Administered 2022-04-10 – 2022-04-11 (×4): 40 ug/min via INTRAVENOUS
  Administered 2022-04-12: 26 ug/min via INTRAVENOUS
  Administered 2022-04-12 (×2): 40 ug/min via INTRAVENOUS
  Administered 2022-04-13: 21 ug/min via INTRAVENOUS
  Administered 2022-04-13: 30 ug/min via INTRAVENOUS
  Administered 2022-04-14: 14 ug/min via INTRAVENOUS
  Administered 2022-04-14: 20 ug/min via INTRAVENOUS
  Administered 2022-04-16: 5 ug/min via INTRAVENOUS
  Administered 2022-04-17: 13 ug/min via INTRAVENOUS
  Filled 2022-04-10 (×14): qty 250

## 2022-04-10 MED ORDER — DOCUSATE SODIUM 100 MG PO CAPS: 100.0000 mg | ORAL_CAPSULE | Freq: Two times a day (BID) | ORAL | Status: DC | PRN

## 2022-04-10 MED ORDER — SODIUM CHLORIDE 0.9 % IV SOLN: 2.0000 g | Freq: Three times a day (TID) | INTRAVENOUS | Status: DC

## 2022-04-10 MED ORDER — EPINEPHRINE HCL 5 MG/250ML IV SOLN IN NS
0.5000 ug/min | INTRAVENOUS | Status: DC
  Filled 2022-04-10: qty 250

## 2022-04-10 MED ORDER — NALOXONE HCL 2 MG/2ML IJ SOSY
PREFILLED_SYRINGE | INTRAMUSCULAR | Status: AC
  Administered 2022-04-10: 2 mg via INTRAVENOUS
  Filled 2022-04-10: qty 2

## 2022-04-10 MED ORDER — HEPARIN SODIUM (PORCINE) 5000 UNIT/ML IJ SOLN
5000.0000 [IU] | Freq: Three times a day (TID) | INTRAMUSCULAR | Status: DC
  Administered 2022-04-10 – 2022-04-11 (×2): 5000 [IU] via SUBCUTANEOUS
  Filled 2022-04-10 (×2): qty 1

## 2022-04-10 MED ORDER — LACTATED RINGERS IV SOLN: INTRAVENOUS | Status: AC

## 2022-04-10 MED ORDER — SODIUM CHLORIDE 0.9 % IV SOLN
2.0000 g | Freq: Once | INTRAVENOUS | Status: AC
  Administered 2022-04-10: 2 g via INTRAVENOUS
  Filled 2022-04-10: qty 12.5

## 2022-04-10 MED ORDER — VASOPRESSIN 20 UNITS/100 ML INFUSION FOR SHOCK
0.0000 [IU]/min | INTRAVENOUS | Status: DC
  Administered 2022-04-10: 0.03 [IU]/min via INTRAVENOUS
  Filled 2022-04-10: qty 100

## 2022-04-10 MED ORDER — SODIUM CHLORIDE 0.9 % IV BOLUS
1000.0000 mL | Freq: Once | INTRAVENOUS | Status: AC
  Administered 2022-04-10: 1000 mL via INTRAVENOUS

## 2022-04-10 MED ORDER — INSULIN ASPART 100 UNIT/ML IJ SOLN
0.0000 [IU] | INTRAMUSCULAR | Status: DC
  Administered 2022-04-10: 5 [IU] via SUBCUTANEOUS
  Administered 2022-04-10: 3 [IU] via SUBCUTANEOUS
  Administered 2022-04-10: 9 [IU] via SUBCUTANEOUS
  Administered 2022-04-11 – 2022-04-12 (×3): 1 [IU] via SUBCUTANEOUS
  Administered 2022-04-13: 2 [IU] via SUBCUTANEOUS
  Administered 2022-04-13: 1 [IU] via SUBCUTANEOUS
  Administered 2022-04-13: 2 [IU] via SUBCUTANEOUS
  Administered 2022-04-13: 1 [IU] via SUBCUTANEOUS
  Administered 2022-04-13: 2 [IU] via SUBCUTANEOUS
  Administered 2022-04-14: 3 [IU] via SUBCUTANEOUS
  Administered 2022-04-14: 5 [IU] via SUBCUTANEOUS

## 2022-04-10 MED ORDER — POTASSIUM CHLORIDE 20 MEQ PO PACK
40.0000 meq | PACK | Freq: Two times a day (BID) | ORAL | Status: DC
  Administered 2022-04-10 – 2022-04-11 (×2): 40 meq
  Filled 2022-04-10 (×2): qty 2

## 2022-04-10 MED ORDER — MIDAZOLAM-SODIUM CHLORIDE 100-0.9 MG/100ML-% IV SOLN
0.0000 mg/h | INTRAVENOUS | Status: DC
  Administered 2022-04-10 (×3): 2 mg/h via INTRAVENOUS
  Filled 2022-04-10: qty 100

## 2022-04-10 MED ORDER — SODIUM CHLORIDE 0.9 % IV SOLN
1.0000 g | Freq: Three times a day (TID) | INTRAVENOUS | Status: DC
  Administered 2022-04-10 – 2022-04-14 (×12): 1 g via INTRAVENOUS
  Filled 2022-04-10 (×14): qty 20

## 2022-04-10 MED ORDER — POTASSIUM CHLORIDE 10 MEQ/50ML IV SOLN
10.0000 meq | INTRAVENOUS | Status: AC
  Administered 2022-04-10 (×3): 10 meq via INTRAVENOUS
  Filled 2022-04-10 (×4): qty 50

## 2022-04-10 MED ORDER — NOREPINEPHRINE 4 MG/250ML-% IV SOLN: 2.0000 ug/min | INTRAVENOUS | Status: DC

## 2022-04-10 MED ORDER — POLYETHYLENE GLYCOL 3350 17 G PO PACK: 17.0000 g | PACK | Freq: Every day | ORAL | Status: DC | PRN

## 2022-04-10 MED ORDER — HYDROCORTISONE SOD SUC (PF) 100 MG IJ SOLR
100.0000 mg | Freq: Once | INTRAMUSCULAR | Status: AC
  Administered 2022-04-10: 100 mg via INTRAVENOUS
  Filled 2022-04-10: qty 2

## 2022-04-10 MED ORDER — NOREPINEPHRINE 4 MG/250ML-% IV SOLN
0.0000 ug/min | INTRAVENOUS | Status: DC
  Administered 2022-04-10: 20 ug/min via INTRAVENOUS
  Filled 2022-04-10 (×3): qty 250

## 2022-04-10 MED ORDER — NALOXONE HCL 2 MG/2ML IJ SOSY: 2.0000 mg | PREFILLED_SYRINGE | Freq: Once | INTRAMUSCULAR | Status: AC

## 2022-04-10 MED ORDER — VASOPRESSIN 20 UNITS/100 ML INFUSION FOR SHOCK
0.0000 [IU]/min | INTRAVENOUS | Status: DC
  Administered 2022-04-10 – 2022-04-16 (×11): 0.03 [IU]/min via INTRAVENOUS
  Filled 2022-04-10 (×11): qty 100

## 2022-04-10 NOTE — ED Notes (Signed)
Titrate vasopressors down per Dr. Doren Custard verbal request. Will continue to monitor.

## 2022-04-10 NOTE — Progress Notes (Addendum)
eLink Physician-Brief Progress Note Patient Name: Charles [name] DOB: [birth year] MRN: [id number]   Date of Service  04/05/2022  HPI/Events of Note  Septic Shock, resp failure, found to be hypothermic  Mild trop elevation  eICU Interventions  Apply [employer], Trend trop - repeat EKG in AM   1057: Abdominal XR ordered to verify enteral tube.  2324: Okay to use, sitting in pylorus/stomach vs early duodenum   Intervention Category Minor Interventions: Routine modifications to care plan (e.g. PRN medications for pain, fever)  Kelita Wallis 03/15/2022, 8:02 PM

## 2022-04-10 NOTE — H&P (Signed)
NAME:  Charles [name], MRN:  [id number], DOB:  02-Nov-1960, LOS: 0 ADMISSION DATE:  03/16/2022, CONSULTATION DATE:  03/22/2022 REFERRING MD:  Doren Custard EDP, CHIEF COMPLAINT:  Unresponsiveness    History of Present Illness:  Charles [name] 62 yo male with a PMH of dementia with progressive neurological decline, DM, chronic D-HF, CAD s/p CABG in 2004, chronic urinary retention w/ indwelling foley, with hx of recurrent UTIs, who presented to [facility] from SNF ([facility]) unresponsive. Per note, pt is normally alert, and SNF staff reports he was normal this morning eating breakfast and then was found unresponsive and hypotensive after breakfast. On arrival to ED patient SBP in the 30's, and thus was intubated on arrival. Further chart review shows he was recently hospitalized November 2023 with sepsis and bacterima d/t UTI as well as December 2023-January 2024 for pyelonephritis.   ED workup reveled K+ 3.2, Total Co2 17, Albumin 1.7, Anion gap 17, Trop 35, LA 7.8, RVP negative. UA with leukocytes, protein, and bacteria. Blood cultures pending. CXR right base atelectasis, CT Head with NAICA with an unchanged 4-27mm colloid cyst. CT Chest Abd Pelvis with Right lower lobe consolidation concerning for PNA vs Asp PNA.   Called sister Charles [name] Psychologist, educational) who lives in [place] to discuss patients status. Charles [name] is concerned and explains that she feel that this was a rather sudden change from when she last talk to him on Friday (04/08/2022). She said she is aware of the patients wishes for not wanting any advanced life sustaining measures, but given the acute change, she would like to continue treating him, however, does not want any CPR in the event that his heart would stop. She is aware of the severity of [name] current state, and would like to given him the best chance of recovery as possible. Per notes on 03/14/2022, goals outlined where patient would prefer to die a natural death. Pt has been by palliative care back in  08/2021 where his GOC are outlined with NO feeding Tube.   PCCM consulted for further management.   Pertinent  Medical History   Past Medical History:  Diagnosis Date   Arthritis    Chronic diastolic heart failure (HCC)    Dementia (HCC)    Diabetes mellitus without complication ([place])    Heart disease    History of recurrent UTIs    (has chronic indwelling foley catheter)   Hypertension     Significant Hospital Events: Including procedures, antibiotic start and stop dates in addition to other pertinent events   03/19/2022 - Arrived to [facility], unresponsive, hypotensive, intubated, CL placed, started on NE and Vaso. On Meropenem, Flagyl, Vancomycin  Interim History / Subjective:  Pt intubated and sedated on Mechanical Ventilation.   Objective   Blood pressure (!) 146/72, pulse 88, temperature (!) 96 F (35.6 C), temperature source Axillary, resp. rate 17, SpO2 100 %.    Vent Mode: PRVC FiO2 (%):  [40 %-100 %] 40 % Set Rate:  [16 bmp-18 bmp] 16 bmp Vt Set:  [530 mL] 530 mL PEEP:  [5 cmH20] 5 cmH20 Plateau Pressure:  [15 cmH20-17 cmH20] 15 cmH20   Intake/Output Summary (Last 24 hours) at 04/04/2022 1610 Last data filed at 04/11/2022 1526 Gross per 24 hour  Intake 1102.12 ml  Output --  Net 1102.12 ml   There were no vitals filed for this visit.  Physical Examination: General: Chronically ill-appearing elderly male in NAD. Intubated  HEENT: [place]/AT, anicteric sclera, PERRL, moist mucous membranes.  Neuro: Comatose. Does  not respond to verbal, tactile or noxious stimuli. Not following commands.  CV: RRR, no m/g/r. PULM: Breathing even and unlabored on PRVC 40% Fio2. Lung fields Coarse and Diminished . GI: Soft, nontender, nondistended. Normoactive bowel sounds. OG in place  Extremities: no LE edema noted. CL in Right Femoral, PICC line to Right Arm  Skin: Warm/dry, no rashes.  Resolved Hospital Problem list     Assessment & Plan:  Septic Shock  ?UTI > likely in the  setting of Asp PNA but UA results with with leukocytes, protein, and bacteria as a secondary source of infection  Hypotension > on midodrine  Plan  - On NE (60) and Vaso - Wean pressors to MAP goal >65 - Fluid resuscitation as tolerated - Trend WBC, fever curve - F/u Cx data - Cont Meropenum and Vancomycin  Acute Respiratory Failure  Asp PNA > likely in the setting of Asp PNA, intubated on 04/09/2022 Plan - LTVV with goal plateau <30 and driving pressure <22. - Minimize sedation  - goal RASS 0 to -1   - Daily SAT & SBT once stable - VAP prevention protocol - PAD protocol - F/u Cx data - Cont Meropenum and Vancomycin  Acute AGMA Lactic Acidosis Hypokalemia Plan - Trend LA - Trend BMP - Replete electrolytes as indicated - Monitor I&Os - Avoid nephrotoxic agents as able - Ensure adequate renal perfusion  Chronic Urinary Retention > Hx of ESBL > UA + with leukocytes, protein, and bacteria  Plan  - Orders to remove and replace foley - Monitor I/O  CAD s/p CABG Elevated Trops> likely demand ischemia  Plan  - Hold home zetia  - Hold home statin  DM Plan  - SSI - CBGs Q4H - Goal CBG 140-180  Best Practice (right click and "Reselect all SmartList Selections" daily)   Diet/type: NPO DVT prophylaxis: prophylactic heparin  GI prophylaxis: H2B Lines: Central line Foley:  Yes, and it is still needed Code Status:  DNR Last date of multidisciplinary goals of care discussion [03/25/2022 with Charles [name]]  Labs   CBC: Recent Labs  Lab 04/05/22 1356 04/05/2022 1116 04/06/2022 1125 03/31/2022 1233  WBC 11.1*  --  6.6  --   NEUTROABS 8.2*  --  6.2  --   HGB 9.5* 9.5*  9.2* 9.0* 7.5*  HCT 29.3* 28.0*  27.0* 28.8* 22.0*  MCV 81.6  --  86.0  --   PLT 292  --  218  --     Basic Metabolic Panel: Recent Labs  Lab 04/05/22 1356 04/04/2022 1116 04/04/2022 1125 04/05/2022 1233  NA 138 139  138 137 136  K 3.1* 3.2*  3.2* 3.2* 3.2*  CL 104 104 103  --   CO2 28  --  17*  --    GLUCOSE 136* 86 86  --   BUN 11 11 12   --   CREATININE 0.57* 0.70 0.99  --   CALCIUM 8.2*  --  8.1*  --   MG  --   --  1.7  --    GFR: CrCl cannot be calculated (Unknown ideal weight.). Recent Labs  Lab 04/05/22 1356 03/27/2022 1125 03/13/2022 1128 03/14/2022 1419  WBC 11.1* 6.6  --   --   LATICACIDVEN  --   --  7.8* 5.7*    Liver Function Tests: Recent Labs  Lab 04/05/22 1356 04/07/2022 1125  AST 76* 41  ALT 26 13  ALKPHOS 95 159*  BILITOT 0.2* 0.6  PROT 6.7 5.5*  ALBUMIN  2.2* 1.7*   No results for input(s): "LIPASE", "AMYLASE" in the last 168 hours. No results for input(s): "AMMONIA" in the last 168 hours.  ABG    Component Value Date/Time   PHART 7.399 04-28-22 1233   PCO2ART 27.6 (L) 04-28-22 1233   PO2ART 463 (H) 2022/04/28 1233   HCO3 17.1 (L) 28-Apr-2022 1233   TCO2 18 (L) April 28, 2022 1233   ACIDBASEDEF 7.0 (H) 04/28/2022 1233   O2SAT 100 Apr 28, 2022 1233     Coagulation Profile: Recent Labs  Lab 04-28-2022 1125  INR 1.2    Cardiac Enzymes: No results for input(s): "CKTOTAL", "CKMB", "CKMBINDEX", "TROPONINI" in the last 168 hours.  HbA1C: Hgb A1c MFr Bld  Date/Time Value Ref Range Status  01/27/2022 05:32 AM 7.7 (H) 4.8 - 5.6 % Final    Comment:    (NOTE) Pre diabetes:          5.7%-6.4%  Diabetes:              >6.4%  Glycemic control for   <7.0% adults with diabetes   07/03/2021 01:52 AM 10.4 (H) 4.8 - 5.6 % Final    Comment:    (NOTE) Pre diabetes:          5.7%-6.4%  Diabetes:              >6.4%  Glycemic control for   <7.0% adults with diabetes     CBG: No results for input(s): "GLUCAP" in the last 168 hours.  Review of Systems:   Review of Systems  Unable to perform ROS: Intubated   Past Medical History:  He,  has a past medical history of Arthritis, Chronic diastolic heart failure (HCC), Dementia (HCC), Diabetes mellitus without complication (HCC), Heart disease, History of recurrent UTIs, and Hypertension.   Surgical  History:   Past Surgical History:  Procedure Laterality Date   COLONOSCOPY WITH PROPOFOL N/A 08/21/2021   Procedure: COLONOSCOPY WITH PROPOFOL;  Surgeon: Jenel Lucks, MD;  Location: [facility] ENDOSCOPY;  Service: Gastroenterology;  Laterality: N/A;   CORONARY ARTERY BYPASS GRAFT     ESOPHAGOGASTRODUODENOSCOPY (EGD) WITH PROPOFOL N/A 08/21/2021   Procedure: ESOPHAGOGASTRODUODENOSCOPY (EGD) WITH PROPOFOL;  Surgeon: Jenel Lucks, MD;  Location: [facility] ENDOSCOPY;  Service: Gastroenterology;  Laterality: N/A;   POLYPECTOMY  08/21/2021   Procedure: POLYPECTOMY;  Surgeon: Jenel Lucks, MD;  Location: [facility] ENDOSCOPY;  Service: Gastroenterology;;     Social History:   reports that he has never smoked. He has never used smokeless tobacco. He reports that he does not currently use alcohol. He reports that he does not use drugs.   Family History:  His family history includes Diabetes in his father, mother, sister, and sister; Heart attack in his father; Hypertension in his father and mother.   Allergies No Known Allergies   Home Medications  Prior to Admission medications   Medication Sig Start Date End Date Taking? Authorizing Provider  aspirin EC 81 MG tablet Take 1 tablet (81 mg total) by mouth daily. Swallow whole. 09/14/21   Pokhrel, Rebekah Chesterfield, MD  blood glucose meter kit and supplies Dispense based on patient and insurance preference. Use up to four times daily as directed. (FOR ICD-10 E10.9, E11.9). 12/23/20   de [place], Buren Kos, MD  docusate sodium (COLACE) 100 MG capsule Take 100 mg by mouth at bedtime.    [provider]  dronabinol (MARINOL) 5 MG capsule Take 5 mg by mouth 2 (two) times daily before a meal.    [provider]  ezetimibe (ZETIA) 10 MG tablet Take 1 tablet (10 mg total) by mouth daily. 07/07/21   Thurnell Lose, MD  FeFum-FePoly-FA-B Cmp-C-Biot (INTEGRA PLUS) CAPS Take 1 capsule by mouth every morning. 04/05/22   Heilingoetter, Cassandra L, PA-C   Glucerna (GLUCERNA) LIQD Take 237 mLs by mouth 3 (three) times daily with meals.    [provider]  insulin glargine-yfgn (SEMGLEE) 100 UNIT/ML injection Inject 0.18 mLs (18 Units total) into the skin daily. 01/31/22   Shelly Coss, MD  insulin lispro (HUMALOG) 200 UNIT/ML KwikPen Inject into the skin in the morning, at noon, in the evening, and at bedtime. Inject as per sliding scale: if 121-150 = 3 units; 151-200 = 4 units; 201-250 = 7 units; 251-300 = 11 units; 301-350 = 15 units; 351-400 = 20 units. If CBG>400 call MD/NP if < 70 notify MD/NP    [provider]  loperamide (IMODIUM) 2 MG capsule Take 1 capsule (2 mg total) by mouth as needed for diarrhea or loose stools. 12/05/21   Danford, Suann Larry, MD  magnesium oxide (MAG-OX) 400 (240 Mg) MG tablet Take 400 mg by mouth daily.    [provider]  methenamine (HIPREX) 1 g tablet Take 1 g by mouth 2 (two) times daily.    [provider]  midodrine (PROAMATINE) 10 MG tablet Take 1 tablet (10 mg total) by mouth 3 (three) times daily with meals. 07/07/21   Thurnell Lose, MD  Multiple Vitamin (MULTIVITAMIN WITH MINERALS) TABS tablet Take 1 tablet by mouth daily. 09/01/21   Hosie Poisson, MD  NON FORMULARY Take 118 mLs by mouth See admin instructions. Magic Cup desert cup- Eat 1 "cup" (118 ml's) by mouth 2 times a day- with lunch and supper    [provider]  oxybutynin (DITROPAN) 5 MG tablet Take 5 mg by mouth 2 (two) times daily.    [provider]  oxyCODONE (OXY IR/ROXICODONE) 5 MG immediate release tablet Take 1 tablet (5 mg total) by mouth every 4 (four) hours as needed for moderate pain. 03/15/22   Oswald Hillock, MD  pantoprazole (PROTONIX) 40 MG tablet Take 1 tablet (40 mg total) by mouth daily. Any generic PPI is okay to dispense Patient not taking: Reported on 04/05/2022 04/25/21   Rai, Vernelle Emerald, MD  polyethylene glycol (MIRALAX / GLYCOLAX) 17 g packet Take 17 g by mouth daily.  09/11/21   Pokhrel, Corrie Mckusick, MD  potassium chloride SA (KLOR-CON M) 20 MEQ tablet Take 1 tablet (20 mEq total) by mouth 2 (two) times daily. 04/05/22   Heilingoetter, Cassandra L, PA-C  rosuvastatin (CRESTOR) 20 MG tablet Take 20 mg by mouth daily.    [provider]  thiamine (VITAMIN B-1) 100 MG tablet Take 100 mg by mouth daily.    [provider]  thiamine 100 MG tablet Take 1 tablet (100 mg total) by mouth daily. Patient not taking: Reported on 04/05/2022 09/01/21   Hosie Poisson, MD  tiZANidine (ZANAFLEX) 2 MG tablet Take 2 mg by mouth every 8 (eight) hours as needed for muscle spasms.    [provider]  zinc oxide 20 % ointment Apply 1 Application topically 2 (two) times daily. Apply to scrotum topically every shift for excoriation    [provider]     Critical care time:     Erma Heritage, NP-S Cct deferred to Attending

## 2022-04-10 NOTE — ED Triage Notes (Signed)
Pt brought in by EMS from [place]; pt has dementia but is normally alert; EMS was told by staff that he was good before breakfast but then was found after breakfast to be nonresponsive, hypotensive. EMS was called.

## 2022-04-10 NOTE — ED Notes (Addendum)
NS bolus @ 1100 Levo started at 1100 at 33mcg/min per MD Dixon Levo increased to 60mcg/min at 1104 per MD [facility]

## 2022-04-10 NOTE — Progress Notes (Signed)
Patient transported from ED Room 20 to 7H15 with no complications noted.

## 2022-04-10 NOTE — ED Notes (Signed)
OG tube advanced 4cm by this RN per radiologist.

## 2022-04-10 NOTE — Sepsis Progress Note (Signed)
Sepsis protocol monitored by eLink 

## 2022-04-10 NOTE — ED Notes (Signed)
6mg  etomidate IVP per verbal MD Dixon @1111  50mg  rocuronium IVP per verbal MD Dixon @1113 

## 2022-04-10 NOTE — ED Notes (Signed)
Date and time results received: April 11, 2022 1528  Test: Troponin Critical Value: 154  Name of Provider Notified: Godfrey Pick  Orders Received? Or Actions Taken?: No new orders received. Will continue to monitor.

## 2022-04-10 NOTE — ED Notes (Signed)
ED TO INPATIENT HANDOFF REPORT  ED Nurse Name and Phone #: Newman Pies [phone]  S Name/Age/Gender Charles [name] 62 y.o. male Room/Bed: 020C/020C  Code Status   Code Status: DNR  Home/SNF/Other Skilled nursing facility UTA orientation due to mental status on arrival Is this baseline? No   Triage Complete: Triage complete  Chief Complaint Septic shock ([place]) [A41.9, R65.21]  Triage Note Pt brought in by EMS from [place]; pt has dementia but is normally alert; EMS was told by staff that he was good before breakfast but then was found after breakfast to be nonresponsive, hypotensive. EMS was called.    Allergies No Known Allergies  Level of Care/Admitting Diagnosis ED Disposition     ED Disposition  Admit   Condition  --   Comment  Hospital Area: [facility] [[id number]]  Level of Care: ICU [6]  May admit patient to [facility] or [facility] if equivalent level of care is available:: Yes  Covid Evaluation: Confirmed COVID Negative  [facility]: [id number]  Admitting Physician: Kipp Brood [id number]  Attending Physician: Kipp Brood [id number]  Certification:: I certify this patient will need inpatient services for at least 2 midnights  Estimated Length of Stay: 3          B Medical/Surgery History Past Medical History:  Diagnosis Date   Arthritis    Chronic diastolic heart failure ([place])    Dementia ([place])    Diabetes mellitus without complication ([place])    Heart disease    History of recurrent UTIs    (has chronic indwelling foley catheter)   Hypertension    Past Surgical History:  Procedure Laterality Date   COLONOSCOPY WITH PROPOFOL N/A 08/21/2021   Procedure: COLONOSCOPY WITH PROPOFOL;  Surgeon: Daryel November, MD;  Location: [place];  Service: Gastroenterology;  Laterality: N/A;   CORONARY ARTERY BYPASS GRAFT     ESOPHAGOGASTRODUODENOSCOPY (EGD) WITH PROPOFOL N/A 08/21/2021   Procedure:  ESOPHAGOGASTRODUODENOSCOPY (EGD) WITH PROPOFOL;  Surgeon: Daryel November, MD;  Location: [place];  Service: Gastroenterology;  Laterality: N/A;   POLYPECTOMY  08/21/2021   Procedure: POLYPECTOMY;  Surgeon: Daryel November, MD;  Location: [facility] ENDOSCOPY;  Service: Gastroenterology;;     A IV Location/Drains/Wounds Patient Lines/Drains/Airways Status     Active Line/Drains/Airways     Name Placement date Placement time Site Days   Peripheral IV 03/26/2022 20 G Left Antecubital 03/22/2022  1111  Antecubital  less than 1   PICC Single Lumen Right Basilic --  --  Basilic  --   CVC Triple Lumen 04/07/2022 Right Femoral 03/20/2022  1210  -- less than 1   Urethral Catheter Arnett RN Straight-tip 16 Fr. 03/08/22  0428  Straight-tip  33   Airway 7.5 mm 03/26/2022  1113  -- less than 1   Pressure Injury 03/08/22 Sacrum Mid Stage 2 -  Partial thickness loss of dermis presenting as a shallow open injury with a red, pink wound bed without slough. Pink but not open 03/08/22  1115  -- 33            Intake/Output Last 24 hours  Intake/Output Summary (Last 24 hours) at 03/17/2022 1712 Last data filed at 04/08/2022 1626 Gross per 24 hour  Intake 1302.12 ml  Output --  Net 1302.12 ml    Labs/Imaging Results for orders placed or performed during the hospital encounter of 04/03/2022 (from the past 48 hour(s))  I-Stat venous blood gas, ED     Status: Abnormal  Collection Time: 03/21/2022 11:16 AM  Result Value Ref Range   pH, Ven 7.315 7.25 - 7.43   pCO2, Ven 35.0 (L) 44 - 60 mmHg   pO2, Ven 69 (H) 32 - 45 mmHg   Bicarbonate 17.8 (L) 20.0 - 28.0 mmol/L   TCO2 19 (L) 22 - 32 mmol/L   O2 Saturation 92 %   Acid-base deficit 8.0 (H) 0.0 - 2.0 mmol/L   Sodium 138 135 - 145 mmol/L   Potassium 3.2 (L) 3.5 - 5.1 mmol/L   Calcium, Ion 1.15 1.15 - 1.40 mmol/L   HCT 27.0 (L) 39.0 - 52.0 %   Hemoglobin 9.2 (L) 13.0 - 17.0 g/dL   Sample type VENOUS   I-stat chem 8, ed     Status: Abnormal   Collection  Time: 04/09/2022 11:16 AM  Result Value Ref Range   Sodium 139 135 - 145 mmol/L   Potassium 3.2 (L) 3.5 - 5.1 mmol/L   Chloride 104 98 - 111 mmol/L   BUN 11 6 - 20 mg/dL   Creatinine, Ser 0.70 0.61 - 1.24 mg/dL   Glucose, Bld 86 70 - 99 mg/dL    Comment: Glucose reference range applies only to samples taken after fasting for at least 8 hours.   Calcium, Ion 1.15 1.15 - 1.40 mmol/L   TCO2 17 (L) 22 - 32 mmol/L   Hemoglobin 9.5 (L) 13.0 - 17.0 g/dL   HCT 28.0 (L) 39.0 - 52.0 %  Comprehensive metabolic panel     Status: Abnormal   Collection Time: 04/02/2022 11:25 AM  Result Value Ref Range   Sodium 137 135 - 145 mmol/L   Potassium 3.2 (L) 3.5 - 5.1 mmol/L   Chloride 103 98 - 111 mmol/L   CO2 17 (L) 22 - 32 mmol/L   Glucose, Bld 86 70 - 99 mg/dL    Comment: Glucose reference range applies only to samples taken after fasting for at least 8 hours.   BUN 12 6 - 20 mg/dL   Creatinine, Ser 0.99 0.61 - 1.24 mg/dL   Calcium 8.1 (L) 8.9 - 10.3 mg/dL   Total Protein 5.5 (L) 6.5 - 8.1 g/dL   Albumin 1.7 (L) 3.5 - 5.0 g/dL   AST 41 15 - 41 U/L   ALT 13 0 - 44 U/L   Alkaline Phosphatase 159 (H) 38 - 126 U/L   Total Bilirubin 0.6 0.3 - 1.2 mg/dL   GFR, Estimated >60 >60 mL/min    Comment: (NOTE) Calculated using the CKD-EPI Creatinine Equation (2021)    Anion gap 17 (H) 5 - 15    Comment: Performed at [facility], [place] [address] [place], [place] [address]  CBC with Differential     Status: Abnormal   Collection Time: 04/01/2022 11:25 AM  Result Value Ref Range   WBC 6.6 4.0 - 10.5 K/uL   RBC 3.35 (L) 4.22 - 5.81 MIL/uL   Hemoglobin 9.0 (L) 13.0 - 17.0 g/dL   HCT 28.8 (L) 39.0 - 52.0 %   MCV 86.0 80.0 - 100.0 fL   MCH 26.9 26.0 - 34.0 pg   MCHC 31.3 30.0 - 36.0 g/dL   RDW 18.3 (H) 11.5 - 15.5 %   Platelets 218 150 - 400 K/uL   nRBC 0.3 (H) 0.0 - 0.2 %   Neutrophils Relative % 94 %   Neutro Abs 6.2 1.7 - 7.7 K/uL   Lymphocytes Relative 4 %   Lymphs Abs 0.3 (L) 0.7 - 4.0  K/uL    Monocytes Relative 1 %   Monocytes Absolute 0.1 0.1 - 1.0 K/uL   Eosinophils Relative 0 %   Eosinophils Absolute 0.0 0.0 - 0.5 K/uL   Basophils Relative 0 %   Basophils Absolute 0.0 0.0 - 0.1 K/uL   Immature Granulocytes 1 %   Abs Immature Granulocytes 0.04 0.00 - 0.07 K/uL    Comment: Performed at [facility], [address] [place], [place] [address]  Protime-INR     Status: None   Collection Time: 03/21/2022 11:25 AM  Result Value Ref Range   Prothrombin Time 15.2 11.4 - 15.2 seconds   INR 1.2 0.8 - 1.2    Comment: (NOTE) INR goal varies based on device and disease states. Performed at [facility], [address], [place], [place] [address]   Urinalysis, Routine w reflex microscopic -Urine, Catheterized; Indwelling urinary catheter     Status: Abnormal   Collection Time: 04/01/2022 11:25 AM  Result Value Ref Range   Color, Urine AMBER (A) YELLOW    Comment: BIOCHEMICALS MAY BE AFFECTED BY COLOR   APPearance TURBID (A) CLEAR   Specific Gravity, Urine 1.011 1.005 - 1.030   pH 6.0 5.0 - 8.0   Glucose, UA 50 (A) NEGATIVE mg/dL   Hgb urine dipstick LARGE (A) NEGATIVE   Bilirubin Urine NEGATIVE NEGATIVE   Ketones, ur NEGATIVE NEGATIVE mg/dL   Protein, ur 782 (A) NEGATIVE mg/dL   Nitrite NEGATIVE NEGATIVE   Leukocytes,Ua MODERATE (A) NEGATIVE   RBC / HPF >50 0 - 5 RBC/hpf   WBC, UA >50 0 - 5 WBC/hpf   Bacteria, UA MANY (A) NONE SEEN   Squamous Epithelial / HPF 0-5 0 - 5 /HPF   WBC Clumps PRESENT    Budding Yeast PRESENT     Comment: Performed at [facility], [address], [place], [place] [address]  Troponin I (High Sensitivity)     Status: Abnormal   Collection Time: 03/25/2022 11:25 AM  Result Value Ref Range   Troponin I (High Sensitivity) 35 (H) <18 ng/L    Comment: (NOTE) Elevated high sensitivity troponin I (hsTnI) values and significant  changes across serial measurements may suggest ACS but many other  chronic and acute conditions are known to  elevate hsTnI results.  Refer to the "Links" section for chest pain algorithms and additional  guidance. Performed at [facility], [address] [place], [place] [address]   Magnesium     Status: None   Collection Time: 04/11/2022 11:25 AM  Result Value Ref Range   Magnesium 1.7 1.7 - 2.4 mg/dL    Comment: Performed at [facility], [address], [place], [place] [address]  Lactic acid, plasma     Status: Abnormal   Collection Time: 03/29/2022 11:28 AM  Result Value Ref Range   Lactic Acid, Venous 7.8 (HH) 0.5 - 1.9 mmol/L    Comment: CRITICAL RESULT CALLED TO, READ BACK BY AND VERIFIED WITH YANG,C RN @ 1250 04/12/2022 LEONARD,A Performed at [facility], [address] [place], [place] [address]   Resp panel by RT-PCR (RSV, Flu A&B, Covid) Anterior Nasal Swab     Status: None   Collection Time: 03/17/2022 11:40 AM   Specimen: Anterior Nasal Swab  Result Value Ref Range   SARS Coronavirus 2 by RT PCR NEGATIVE NEGATIVE    Comment: (NOTE) SARS-CoV-2 target nucleic acids are NOT DETECTED.  The SARS-CoV-2 RNA is  generally detectable in upper respiratory specimens during the acute phase of infection. The lowest concentration of SARS-CoV-2 viral copies this assay can detect is 138 copies/mL. A negative result does not preclude SARS-Cov-2 infection and should not be used as the sole basis for treatment or other patient management decisions. A negative result may occur with  improper specimen collection/handling, submission of specimen other than nasopharyngeal swab, presence of viral mutation(s) within the areas targeted by this assay, and inadequate number of viral copies(<138 copies/mL). A negative result must be combined with clinical observations, patient history, and epidemiological information. The expected result is Negative.  Fact Sheet for Patients:  EntrepreneurPulse.com.au  Fact Sheet for Healthcare Providers:   IncredibleEmployment.be  This test is no t yet approved or cleared by the [place] FDA and  has been authorized for detection and/or diagnosis of SARS-CoV-2 by FDA under an Emergency Use Authorization (EUA). This EUA will remain  in effect (meaning this test can be used) for the duration of the COVID-19 declaration under Section 564(b)(1) of the Act, 21 U.S.C.section 360bbb-3(b)(1), unless the authorization is terminated  or revoked sooner.       Influenza A by PCR NEGATIVE NEGATIVE   Influenza B by PCR NEGATIVE NEGATIVE    Comment: (NOTE) The Xpert Xpress SARS-CoV-2/FLU/RSV plus assay is intended as an aid in the diagnosis of influenza from Nasopharyngeal swab specimens and should not be used as a sole basis for treatment. Nasal washings and aspirates are unacceptable for Xpert Xpress SARS-CoV-2/FLU/RSV testing.  Fact Sheet for Patients: EntrepreneurPulse.com.au  Fact Sheet for Healthcare Providers: IncredibleEmployment.be  This test is not yet approved or cleared by the [place] FDA and has been authorized for detection and/or diagnosis of SARS-CoV-2 by FDA under an Emergency Use Authorization (EUA). This EUA will remain in effect (meaning this test can be used) for the duration of the COVID-19 declaration under Section 564(b)(1) of the Act, 21 U.S.C. section 360bbb-3(b)(1), unless the authorization is terminated or revoked.     Resp Syncytial Virus by PCR NEGATIVE NEGATIVE    Comment: (NOTE) Fact Sheet for Patients: EntrepreneurPulse.com.au  Fact Sheet for Healthcare Providers: IncredibleEmployment.be  This test is not yet approved or cleared by the [place] FDA and has been authorized for detection and/or diagnosis of SARS-CoV-2 by FDA under an Emergency Use Authorization (EUA). This EUA will remain in effect (meaning this test can be used) for the duration of  the COVID-19 declaration under Section 564(b)(1) of the Act, 21 U.S.C. section 360bbb-3(b)(1), unless the authorization is terminated or revoked.  Performed at [facility], [place] [address], [place], [place] [address]   I-Stat arterial blood gas, ED     Status: Abnormal   Collection Time: 03/19/2022 12:33 PM  Result Value Ref Range   pH, Arterial 7.399 7.35 - 7.45   pCO2 arterial 27.6 (L) 32 - 48 mmHg   pO2, Arterial 463 (H) 83 - 108 mmHg   Bicarbonate 17.1 (L) 20.0 - 28.0 mmol/L   TCO2 18 (L) 22 - 32 mmol/L   O2 Saturation 100 %   Acid-base deficit 7.0 (H) 0.0 - 2.0 mmol/L   Sodium 136 135 - 145 mmol/L   Potassium 3.2 (L) 3.5 - 5.1 mmol/L   Calcium, Ion 1.18 1.15 - 1.40 mmol/L   HCT 22.0 (L) 39.0 - 52.0 %   Hemoglobin 7.5 (L) 13.0 - 17.0 g/dL   Patient temperature 98.6 F    Collection site RADIAL, ALLEN'S TEST ACCEPTABLE  Drawn by RT    Sample type ARTERIAL   Lactic acid, plasma     Status: Abnormal   Collection Time: 03/31/2022  2:19 PM  Result Value Ref Range   Lactic Acid, Venous 5.7 (HH) 0.5 - 1.9 mmol/L    Comment: CRITICAL VALUE NOTED. VALUE IS CONSISTENT WITH PREVIOUSLY REPORTED/CALLED VALUE Performed at [facility], [place] [address], [place], [place] [address]   Troponin I (High Sensitivity)     Status: Abnormal   Collection Time: 03/20/2022  2:19 PM  Result Value Ref Range   Troponin I (High Sensitivity) 154 (HH) <18 ng/L    Comment: CRITICAL RESULT CALLED TO, READ BACK BY AND VERIFIED WITH C.Heer Justiss RN 1528 03/21/2022 MCCORMICK K (NOTE) Elevated high sensitivity troponin I (hsTnI) values and significant  changes across serial measurements may suggest ACS but many other  chronic and acute conditions are known to elevate hsTnI results.  Refer to the "Links" section for chest pain algorithms and additional  guidance. Performed at [facility], [place] [address], [place], [place] [address]   CBG monitoring, ED     Status: Abnormal   Collection Time:  03/19/2022  4:36 PM  Result Value Ref Range   Glucose-Capillary 380 (H) 70 - 99 mg/dL    Comment: Glucose reference range applies only to samples taken after fasting for at least 8 hours.   CT CHEST ABDOMEN PELVIS WO CONTRAST  Result Date: 03/21/2022 CLINICAL DATA:  Mental status change EXAM: CT CHEST, ABDOMEN AND PELVIS WITHOUT CONTRAST TECHNIQUE: Multidetector CT imaging of the chest, abdomen and pelvis was performed following the standard protocol without IV contrast. RADIATION DOSE REDUCTION: This exam was performed according to the departmental dose-optimization program which includes automated exposure control, adjustment of the mA and/or kV according to patient size and/or use of iterative reconstruction technique. COMPARISON:  None Available. FINDINGS: CT CHEST FINDINGS Cardiovascular: Post CABG.  No acute cardiac findings Mediastinum/Nodes: Endotracheal tube in mid trachea. NG tube extends to the esophagus in the stomach. No mediastinal adenopathy. Lungs/Pleura: Band of consolidation in the RIGHT lower lobe. No pleural fluid. Upper lungs clear. Musculoskeletal: No aggressive osseous lesion. CT ABDOMEN AND PELVIS FINDINGS Hepatobiliary: No focal hepatic lesion. No biliary ductal dilatation. Gallbladder is normal. Common bile duct is normal. Pancreas: Pancreas is normal. No ductal dilatation. No pancreatic inflammation. Spleen: Normal spleen Adrenals/urinary tract: Adrenal glands normal. Mild perinephric stranding greater on the RIGHT. No RIGHT hydroureter. There several calculi within the lumen the bladder. Foley catheter in bladder. No ureterolithiasis identified. Stomach/Bowel: Stomach is markedly distended by gas. There is NG tube in stomach; however, the side port appears at the GE junction. Recommend advancing the NG tube several cm (4 cm) such that the side port is below the GE junction. No dilated loops of small bowel. Appendix not identified. Colon is relatively collapsed. Moderate volume stool  in the sigmoid colon rectum. Vascular/Lymphatic: Abdominal aorta is normal caliber with atherosclerotic calcification. There is no retroperitoneal or periportal lymphadenopathy. No pelvic lymphadenopathy. Tiny focus of gas in the true a tiny focus of gas in the inferior vena cava (image 54/3) Reproductive: Prostate unremarkable Other: No intraperitoneal free air. Musculoskeletal: Comminuted fracture of the RIGHT femoral neck extensive soft tissue thickening about the fracture suggests chronic fracture. IMPRESSION: CHEST IMPRESSION: 1. Band of consolidation in the RIGHT lower lobe is concerning for pneumonia or aspiration pneumonia. 2. Endotracheal tube and NG tube in place. PELVIS IMPRESSION: 1. No evidence of bowel obstruction. 2. Marked  gaseous distension of the stomach. NG tube in place however the side port is at the GE junction. RECOMMEND ADVANCING NG TUBE SEVERAL CM (4 CM) such that the side port is below the GE junction. 3. Perinephric stranding on the RIGHT may relate to acute or resolving pyelonephritis or potential cleared ureteral obstruction. 4. Bladder calculi. 5. Comminuted fracture of the RIGHT femoral neck with extensive soft tissue thickening about the fracture suggests chronic fracture. 6.  Aortic Atherosclerosis (ICD10-I70.0). These results will be called to the ordering clinician or representative by the Radiologist Assistant, and communication documented in the PACS or [employer]. Electronically Signed   By: Suzy Bouchard M.D.   On: 05-08-2022 14:28   CT Head Wo Contrast  Result Date: 2022-05-08 CLINICAL DATA:  Altered mental status EXAM: CT HEAD WITHOUT CONTRAST TECHNIQUE: Contiguous axial images were obtained from the base of the skull through the vertex without intravenous contrast. RADIATION DOSE REDUCTION: This exam was performed according to the departmental dose-optimization program which includes automated exposure control, adjustment of the mA and/or kV according to patient  size and/or use of iterative reconstruction technique. 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 The lateral and third ventricles are prominent, similar to multiple prior CTs and MRIs. There is no evidence of transependymal flow of CSF. 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 The pituitary and suprasellar region are normal. There is no solid mass lesion. There is no mass effect or midline shift. Vascular: There is calcification of the bilateral carotid siphons and vertebral arteries. Skull: Normal. Negative for fracture or focal lesion. Sinuses/Orbits: There is mild mucosal thickening in the right maxillary sinus. Bilateral lens implants are in place. The globes and orbits are otherwise unremarkable. Other: None. IMPRESSION: 1. No acute intracranial pathology. 2. 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 Electronically Signed   By: Valetta Mole M.D.   On: May 08, 2022 14:22   DG Chest Port 1 View  Result Date: May 08, 2022 CLINICAL DATA:  Sepsis.  Ventilator dependence. EXAM: PORTABLE CHEST 1 VIEW COMPARISON:  01/26/2022 FINDINGS: 1147 hours. Endotracheal tube tip is approximately 4.8 cm above the base of the carina. The NG tube passes into the stomach although the distal tip position is not included on the film. Right PICC line tip overlies the mid SVC level. The cardiopericardial silhouette is within normal limits for size. Minimal atelectasis noted right base but lungs are otherwise clear. Prominent gastric bubble evident despite the presence of an NG tube. Telemetry leads overlie the chest. IMPRESSION: 1. Minimal right base atelectasis. 2. Prominent gastric bubble despite the presence of an NG tube. Electronically Signed   By: Misty Stanley  M.D.   On: 05-08-22 11:54    Pending Labs Unresulted Labs (From admission, onward)     Start     Ordered   04/11/22 0500  Procalcitonin  Daily,   R      08-May-2022 1448   04/11/22 0500  CBC  Tomorrow morning,   R        05-08-2022 1606   04/11/22 1610  Basic metabolic panel  Tomorrow morning,   R        05-08-22 1606   04/11/22 0500  Phosphorus  Tomorrow morning,   R  03/13/2022 1606   04/11/22 0500  Magnesium  Tomorrow morning,   R        03/16/2022 1606   04/01/2022 1607  CBC  (heparin)  Once,   R       Comments: Baseline for heparin therapy IF NOT ALREADY DRAWN.  Notify MD if PLT < 100 K.    03/15/2022 1606   03/16/2022 1607  Creatinine, serum  (heparin)  Once,   R       Comments: Baseline for heparin therapy IF NOT ALREADY DRAWN.    03/14/2022 1606   03/19/2022 1607  Culture, Respiratory w Gram Stain (tracheal aspirate)  Once,   R        03/30/2022 1606   04/03/2022 1607  Strep pneumoniae urinary antigen (not at [facility])  Once,   R        03/22/2022 1606   04/09/2022 1607  Legionella Pneumophila Serogp 1 Ur Ag  Once,   R        03/31/2022 1606   04/12/2022 1449  Procalcitonin - Baseline  ONCE - URGENT,   URGENT        03/22/2022 1448   04/09/2022 1422  Culture, blood (Routine X 2) w Reflex to ID Panel  Once,   R        03/18/2022 1422   04/04/2022 1232  Blood gas, arterial  Once,   R       Comments: Post intubation ABG    04/11/2022 1231   04/09/2022 1125  Blood Culture (routine x 2)  (Septic presentation on arrival (screening labs, nursing and treatment orders for obvious sepsis))  BLOOD CULTURE X 2,   STAT      04/01/2022 1126   03/20/2022 1125  Remove and replace urinary cath (placed > 5 days) then obtain urine culture from new indwelling urinary catheter.  (Septic presentation on arrival (screening labs, nursing and treatment orders for obvious sepsis))  Once,   URGENT       Question:  Indication  Answer:  Sepsis   04/09/2022 1126            Vitals/Pain Today's Vitals   04/03/2022 1545 03/16/2022 1600  03/24/2022 1615 03/28/2022 1630  BP: (!) 146/72 (!) 141/71 (!) 63/47 (!) 59/47  Pulse: 88     Resp: 17 17 17  (!) 23  Temp:      TempSrc:      SpO2: 100%       Isolation Precautions No active isolations  Medications Medications  midazolam (VERSED) 100 mg/100 mL (1 mg/mL) premix infusion (2 mg/hr Intravenous New Bag/Given 03/16/2022 1129)  midazolam (VERSED) bolus via infusion 0-5 mg (has no administration in time range)  0.9 %  sodium chloride infusion (250 mLs Intravenous Not Given 03/13/2022 1525)  lactated ringers infusion ( Intravenous Not Given 03/31/2022 1626)  vancomycin (VANCOREADY) IVPB 500 mg/100 mL (has no administration in time range)  meropenem (MERREM) 1 g in sodium chloride 0.9 % 100 mL IVPB (0 g Intravenous Stopped 03/17/2022 1503)  norepinephrine (LEVOPHED) 16 mg in 230mL (0.064 mg/mL) premix infusion (40 mcg/min Intravenous Rate/Dose Change 03/29/2022 1631)  docusate sodium (COLACE) capsule 100 mg (has no administration in time range)  polyethylene glycol (MIRALAX / GLYCOLAX) packet 17 g (has no administration in time range)  heparin injection 5,000 Units (has no administration in time range)  famotidine (PEPCID) tablet 20 mg (has no administration in time range)  0.9 %  sodium chloride infusion (250 mLs Intravenous Not Given 04/06/2022  1626)  lactated ringers infusion ( Intravenous New Bag/Given 03/16/2022 1641)    Followed by  lactated ringers infusion (has no administration in time range)  insulin aspart (novoLOG) injection 0-9 Units (9 Units Subcutaneous Given 03/24/2022 1643)  potassium chloride 10 mEq in 50 mL *CENTRAL LINE* IVPB (10 mEq Intravenous New Bag/Given 04/04/2022 1643)  potassium chloride (KLOR-CON) packet 40 mEq (has no administration in time range)  vasopressin (PITRESSIN) 20 Units in sodium chloride 0.9 % 100 mL infusion-*FOR SHOCK* (0.03 Units/min Intravenous New Bag/Given 03/19/2022 1632)  sodium chloride 0.9 % bolus 1,000 mL (0 mLs Intravenous Stopped 03/24/2022 1145)    And   sodium chloride 0.9 % bolus 250 mL (0 mLs Intravenous Stopped 03/28/2022 1234)  ceFEPIme (MAXIPIME) 2 g in sodium chloride 0.9 % 100 mL IVPB (0 g Intravenous Stopped 03/30/2022 1334)  metroNIDAZOLE (FLAGYL) IVPB 500 mg (0 mg Intravenous Stopped 03/16/2022 1526)  vancomycin (VANCOCIN) IVPB 1000 mg/200 mL premix (0 mg Intravenous Stopped 03/31/2022 1626)  naloxone (NARCAN) injection 2 mg (2 mg Intravenous Given 04/01/2022 1108)  sodium chloride 0.9 % bolus 1,000 mL (0 mLs Intravenous Stopped 04/09/2022 1504)  hydrocortisone sodium succinate (SOLU-CORTEF) 100 MG injection 100 mg (100 mg Intravenous Given 04/05/2022 1439)    Mobility non-ambulatory     Focused Assessments Cardiac Assessment Handoff:    Lab Results  Component Value Date   CKTOTAL 250 04/13/2021   No results found for: "DDIMER" Does the Patient currently have chest pain? No    R Recommendations: See Admitting Provider Note  Report given to:   Additional Notes: On levo and vasopressin for BP management; sedation not required so far due to pt mental status. Central line in place

## 2022-04-10 NOTE — ED Provider Notes (Signed)
[name] EMERG[facility]Y DE[facility]MENT AT [facility] Provider Note   CSN: [id number] Arrival date & time: 04-25-2022  1047     History  Chief Complaint  Patient presents with   Altered Mental Status    Charles [name] is a 62 y.o. male.   Altered Mental Status Patient presents for altered mental status.  Medical history includes progressive neurologic decline, HTN, CAD, BPH, DM, HLD, urine retention, GI bleeding, anemia, CHF, arthritis.  He was seen in cancer center 5 days ago.  He has been undergoing workup for possible malignancy causing leukocytosis.  At the time of this office visit, he was able to answer questions appropriately.  He has been noted to have progressive neurologic decline and does reside in a nursing facility.  Today, staff and nursing facility relayed to EMS that he woke up in his normal mentation.  He had a progressive decline and ultimately became unresponsive.  He was unresponsive throughout transit with EMS.     Home Medications Prior to Admission medications   Medication Sig Start Date End Date Taking? Authorizing Provider  aspirin EC 81 MG tablet Take 1 tablet (81 mg total) by mouth daily. Swallow whole. 09/14/21  Yes Pokhrel, Laxman, MD  docusate sodium (COLACE) 100 MG capsule Take 100 mg by mouth at bedtime.   Yes [provider]  dronabinol (MARINOL) 5 MG capsule Take 5 mg by mouth 2 (two) times daily before a meal.   Yes [provider]  ezetimibe (ZETIA) 10 MG tablet Take 1 tablet (10 mg total) by mouth daily. 07/07/21  Yes Leroy Sea, MD  FeFum-FePoly-FA-B Cmp-C-Biot (INTEGRA PLUS) CAPS Take 1 capsule by mouth every morning. 04/05/22  Yes Heilingoetter, Cassandra L, PA-C  Glucerna (GLUCERNA) LIQD Take 237 mLs by mouth 3 (three) times daily with meals.   Yes [provider]  insulin glargine-yfgn (SEMGLEE) 100 UNIT/ML injection Inject 0.18 mLs (18 Units total) into the skin daily. 01/31/22  Yes Burnadette Pop, MD  insulin  lispro (HUMALOG) 200 UNIT/ML KwikPen Inject 3-20 Units into the skin in the morning, at noon, in the evening, and at bedtime. Inject as per sliding scale: if 121-150 = 3 units; 151-200 = 4 units; 201-250 = 7 units; 251-300 = 11 units; 301-350 = 15 units; 351-400 = 20 units. If CBG>400 call MD/NP if < 70 notify MD/NP   Yes [provider]  loperamide (IMODIUM) 2 MG capsule Take 1 capsule (2 mg total) by mouth as needed for diarrhea or loose stools. Patient taking differently: Take 2 mg by mouth daily as needed for diarrhea or loose stools. 12/05/21  Yes Danford, Earl Lites, MD  magnesium oxide (MAG-OX) 400 (240 Mg) MG tablet Take 400 mg by mouth daily.   Yes [provider]  methenamine (HIPREX) 1 g tablet Take 1 g by mouth 2 (two) times daily.   Yes [provider]  midodrine (PROAMATINE) 10 MG tablet Take 1 tablet (10 mg total) by mouth 3 (three) times daily with meals. 07/07/21  Yes Leroy Sea, MD  Multiple Vitamin (MULTIVITAMIN WITH MINERALS) TABS tablet Take 1 tablet by mouth daily. 09/01/21  Yes Kathlen Mody, MD  Nutritional Supplements (NUTRITIONAL SHAKE PO) Take 1 Container by mouth with breakfast, with lunch, and with evening meal.   Yes [provider]  Nutritional Supplements (NUTRITIONAL SUPPLEMENT PO) Take 1 Container by mouth 2 (two) times daily. Magic cup at lunch and at dinner   Yes [provider]  omeprazole (PRILOSEC) 40  MG capsule Take 40 mg by mouth daily.   Yes [provider]  oxybutynin (DITROPAN) 5 MG tablet Take 5 mg by mouth 2 (two) times daily.   Yes [provider]  oxyCODONE (OXY IR/ROXICODONE) 5 MG immediate release tablet Take 1 tablet (5 mg total) by mouth every 4 (four) hours as needed for moderate pain. 03/15/22  Yes Lama, Marge Duncans, MD  polyethylene glycol (MIRALAX / GLYCOLAX) 17 g packet Take 17 g by mouth daily. 09/11/21  Yes Pokhrel, Laxman, MD  potassium chloride SA (KLOR-CON M) 20 MEQ tablet Take 1  tablet (20 mEq total) by mouth 2 (two) times daily. 04/05/22  Yes Heilingoetter, Cassandra L, PA-C  rosuvastatin (CRESTOR) 20 MG tablet Take 20 mg by mouth daily.   Yes [provider]  thiamine 100 MG tablet Take 1 tablet (100 mg total) by mouth daily. 09/01/21  Yes Hosie Poisson, MD  tiZANidine (ZANAFLEX) 2 MG tablet Take 2 mg by mouth every 8 (eight) hours as needed for muscle spasms.   Yes [provider]  zinc oxide 20 % ointment Apply 1 Application topically 2 (two) times daily. Apply to scrotum topically every shift for excoriation   Yes [provider]  blood glucose meter kit and supplies Dispense based on patient and insurance preference. Use up to four times daily as directed. (FOR ICD-10 E10.9, E11.9). 12/23/20   de [place], Blondell Reveal, MD  pantoprazole (PROTONIX) 40 MG tablet Take 1 tablet (40 mg total) by mouth daily. Any generic PPI is okay to dispense Patient not taking: Reported on 04/05/2022 04/25/21   Mendel Corning, MD      Allergies    Patient has no known allergies.    Review of Systems   Review of Systems  Unable to perform ROS: Patient unresponsive    Physical Exam Updated Vital Signs BP (!) 63/47   Pulse 88   Temp (!) 96 F (35.6 C) (Axillary)   Resp 17   SpO2 100%  Physical Exam Constitutional:      Appearance: He is cachectic. He is ill-appearing.  HENT:     Head: Normocephalic and atraumatic.     Right Ear: External ear normal.     Left Ear: External ear normal.     Nose: Nose normal.     Mouth/Throat:     Mouth: Mucous membranes are dry.     Pharynx: Oropharynx is clear.  Eyes:     Comments: Pupils are slightly asymmetric and not reactive.  Cardiovascular:     Rate and Rhythm: Regular rhythm. Tachycardia present.  Pulmonary:     Comments: Slow intermittent agonal respirations. Abdominal:     General: There is no distension.     Tenderness: There is no abdominal tenderness.  Genitourinary:    Comments: Foley catheter in  place Musculoskeletal:        General: No deformity.     Cervical back: Neck supple.     Right lower leg: No edema.     Left lower leg: No edema.  Skin:    General: Skin is dry.  Neurological:     GCS: GCS eye subscore is 1. GCS verbal subscore is 1. GCS motor subscore is 1.     Comments: Eyes are involuntarily open.  Patient unresponsive to any stimuli     ED Results / Procedures / Treatments   Labs (all labs ordered are listed, but only abnormal results are displayed) Labs Reviewed  LACTIC ACID, PLASMA - Abnormal;  Notable for the following components:      Result Value   Lactic Acid, Venous 7.8 (*)    All other components within normal limits  LACTIC ACID, PLASMA - Abnormal; Notable for the following components:   Lactic Acid, Venous 5.7 (*)    All other components within normal limits  COMPREHENSIVE METABOLIC PANEL - Abnormal; Notable for the following components:   Potassium 3.2 (*)    CO2 17 (*)    Calcium 8.1 (*)    Total Protein 5.5 (*)    Albumin 1.7 (*)    Alkaline Phosphatase 159 (*)    Anion gap 17 (*)    All other components within normal limits  CBC WITH DIFFERENTIAL/PLATELET - Abnormal; Notable for the following components:   RBC 3.35 (*)    Hemoglobin 9.0 (*)    HCT 28.8 (*)    RDW 18.3 (*)    nRBC 0.3 (*)    Lymphs Abs 0.3 (*)    All other components within normal limits  URINALYSIS, ROUTINE W REFLEX MICROSCOPIC - Abnormal; Notable for the following components:   Color, Urine AMBER (*)    APPearance TURBID (*)    Glucose, UA 50 (*)    Hgb urine dipstick LARGE (*)    Protein, ur 100 (*)    Leukocytes,Ua MODERATE (*)    Bacteria, UA MANY (*)    All other components within normal limits  I-STAT VENOUS BLOOD GAS, ED - Abnormal; Notable for the following components:   pCO2, Ven 35.0 (*)    pO2, Ven 69 (*)    Bicarbonate 17.8 (*)    TCO2 19 (*)    Acid-base deficit 8.0 (*)    Potassium 3.2 (*)    HCT 27.0 (*)    Hemoglobin 9.2 (*)    All other  components within normal limits  I-STAT CHEM 8, ED - Abnormal; Notable for the following components:   Potassium 3.2 (*)    TCO2 17 (*)    Hemoglobin 9.5 (*)    HCT 28.0 (*)    All other components within normal limits  I-STAT ARTERIAL BLOOD GAS, ED - Abnormal; Notable for the following components:   pCO2 arterial 27.6 (*)    pO2, Arterial 463 (*)    Bicarbonate 17.1 (*)    TCO2 18 (*)    Acid-base deficit 7.0 (*)    Potassium 3.2 (*)    HCT 22.0 (*)    Hemoglobin 7.5 (*)    All other components within normal limits  TROPONIN I (HIGH SENSITIVITY) - Abnormal; Notable for the following components:   Troponin I (High Sensitivity) 35 (*)    All other components within normal limits  TROPONIN I (HIGH SENSITIVITY) - Abnormal; Notable for the following components:   Troponin I (High Sensitivity) 154 (*)    All other components within normal limits  RESP PANEL BY RT-PCR (RSV, FLU A&B, COVID)  RVPGX2  CULTURE, BLOOD (ROUTINE X 2)  URINE CULTURE  CULTURE, BLOOD (ROUTINE X 2) W REFLEX TO ID PANEL  CULTURE, RESPIRATORY W GRAM STAIN  PROTIME-INR  MAGNESIUM  BLOOD GAS, ARTERIAL  PROCALCITONIN  CBC  CREATININE, SERUM  STREP PNEUMONIAE URINARY ANTIGEN  LEGIONELLA PNEUMOPHILA SEROGP 1 UR AG    EKG None  Radiology CT CHEST ABDOMEN PELVIS WO CONTRAST  Result Date: 04/02/2022 CLINICAL DATA:  Mental status change EXAM: CT CHEST, ABDOMEN AND PELVIS WITHOUT CONTRAST TECHNIQUE: Multidetector CT imaging of the chest, abdomen and pelvis was performed following the  standard protocol without IV contrast. RADIATION DOSE REDUCTION: This exam was performed according to the departmental dose-optimization program which includes automated exposure control, adjustment of the mA and/or kV according to patient size and/or use of iterative reconstruction technique. COMPARISON:  None Available. FINDINGS: CT CHEST FINDINGS Cardiovascular: Post CABG.  No acute cardiac findings Mediastinum/Nodes: Endotracheal tube  in mid trachea. NG tube extends to the esophagus in the stomach. No mediastinal adenopathy. Lungs/Pleura: Band of consolidation in the RIGHT lower lobe. No pleural fluid. Upper lungs clear. Musculoskeletal: No aggressive osseous lesion. CT ABDOMEN AND PELVIS FINDINGS Hepatobiliary: No focal hepatic lesion. No biliary ductal dilatation. Gallbladder is normal. Common bile duct is normal. Pancreas: Pancreas is normal. No ductal dilatation. No pancreatic inflammation. Spleen: Normal spleen Adrenals/urinary tract: Adrenal glands normal. Mild perinephric stranding greater on the RIGHT. No RIGHT hydroureter. There several calculi within the lumen the bladder. Foley catheter in bladder. No ureterolithiasis identified. Stomach/Bowel: Stomach is markedly distended by gas. There is NG tube in stomach; however, the side port appears at the GE junction. Recommend advancing the NG tube several cm (4 cm) such that the side port is below the GE junction. No dilated loops of small bowel. Appendix not identified. Colon is relatively collapsed. Moderate volume stool in the sigmoid colon rectum. Vascular/Lymphatic: Abdominal aorta is normal caliber with atherosclerotic calcification. There is no retroperitoneal or periportal lymphadenopathy. No pelvic lymphadenopathy. Tiny focus of gas in the true a tiny focus of gas in the inferior vena cava (image 54/3) Reproductive: Prostate unremarkable Other: No intraperitoneal free air. Musculoskeletal: Comminuted fracture of the RIGHT femoral neck extensive soft tissue thickening about the fracture suggests chronic fracture. IMPRESSION: CHEST IMPRESSION: 1. Band of consolidation in the RIGHT lower lobe is concerning for pneumonia or aspiration pneumonia. 2. Endotracheal tube and NG tube in place. PELVIS IMPRESSION: 1. No evidence of bowel obstruction. 2. Marked gaseous distension of the stomach. NG tube in place however the side port is at the GE junction. RECOMMEND ADVANCING NG TUBE SEVERAL CM  (4 CM) such that the side port is below the GE junction. 3. Perinephric stranding on the RIGHT may relate to acute or resolving pyelonephritis or potential cleared ureteral obstruction. 4. Bladder calculi. 5. Comminuted fracture of the RIGHT femoral neck with extensive soft tissue thickening about the fracture suggests chronic fracture. 6.  Aortic Atherosclerosis (ICD10-I70.0). These results will be called to the ordering clinician or representative by the Radiologist Assistant, and communication documented in the PACS or [employer] Dashboard. Electronically Signed   By: Genevive BiStewart  Edmunds M.D.   On: 2023/03/08 14:28   CT Head Wo Contrast  Result Date: 2022-07-02 CLINICAL DATA:  Altered mental status EXAM: CT HEAD WITHOUT CONTRAST TECHNIQUE: Contiguous axial images were obtained from the base of the skull through the vertex without intravenous contrast. RADIATION DOSE REDUCTION: This exam was performed according to the departmental dose-optimization program which includes automated exposure control, adjustment of the mA and/or kV according to patient size and/or use of iterative reconstruction technique. 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 The lateral and third ventricles are prominent, similar to multiple prior CTs and MRIs. There is no evidence of transependymal flow of CSF. 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 The pituitary and suprasellar region are normal. There is no solid mass lesion. There is no mass  effect or midline shift. Vascular: There is calcification of the bilateral carotid siphons and vertebral arteries. Skull: Normal. Negative for fracture or focal lesion. Sinuses/Orbits: There is mild mucosal thickening in the right maxillary sinus. Bilateral lens implants are in place. The globes and orbits  are otherwise unremarkable. Other: None. IMPRESSION: 1. No acute intracranial pathology. 2. 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 Electronically Signed   By: Lesia Hausen M.D.   On: 03/30/2022 14:22   DG Chest Port 1 View  Result Date: 03/30/2022 CLINICAL DATA:  Sepsis.  Ventilator dependence. EXAM: PORTABLE CHEST 1 VIEW COMPARISON:  01/26/2022 FINDINGS: 1147 hours. Endotracheal tube tip is approximately 4.8 cm above the base of the carina. The NG tube passes into the stomach although the distal tip position is not included on the film. Right PICC line tip overlies the mid SVC level. The cardiopericardial silhouette is within normal limits for size. Minimal atelectasis noted right base but lungs are otherwise clear. Prominent gastric bubble evident despite the presence of an NG tube. Telemetry leads overlie the chest. IMPRESSION: 1. Minimal right base atelectasis. 2. Prominent gastric bubble despite the presence of an NG tube. Electronically Signed   By: Kennith Center M.D.   On: 04/11/2022 11:54    Procedures Procedure Name: Intubation Date/Time: 03/17/2022 11:23 AM  Performed by: Gloris Manchester, MDPre-anesthesia Checklist: Patient being monitored and Suction available Oxygen Delivery Method: Ambu bag Preoxygenation: Pre-oxygenation with 100% oxygen Induction Type: Rapid sequence Laryngoscope Size: Glidescope and 3 Grade View: Grade I Tube size: 7.5 mm Number of attempts: 1 Airway Equipment and Method: Rigid stylet and Video-laryngoscopy Secured at: 22 cm Tube secured with: ETT holder Dental Injury: Teeth and Oropharynx as per pre-operative assessment     .Central Line  Date/Time: 03/15/2022 12:12 PM  Performed by: Gloris Manchester, MD Authorized by: Gloris Manchester, MD   Consent:    Consent obtained:  Emergent situation Pre-procedure details:    Indication(s): central venous access     Hand hygiene: Hand hygiene  performed prior to insertion     Sterile barrier technique: All elements of maximal sterile technique followed     Skin preparation:  Chlorhexidine   Skin preparation agent: Skin preparation agent completely dried prior to procedure   Sedation:    Sedation type:  Moderate sedation Anesthesia:    Anesthesia method:  None Procedure details:    Location:  R femoral   Patient position:  Supine   Procedural supplies:  Triple lumen   Catheter size:  7 Fr   Landmarks identified: yes     Ultrasound guidance: yes     Ultrasound guidance timing: real time     Sterile ultrasound techniques: Sterile gel and sterile probe covers were used     Number of attempts:  1   Successful placement: yes   Post-procedure details:    Post-procedure:  Dressing applied and line sutured   Assessment:  Blood return through all ports   Procedure completion:  Tolerated     Medications Ordered in ED Medications  midazolam (VERSED) 100 mg/100 mL (1 mg/mL) premix infusion (2 mg/hr Intravenous New Bag/Given 03/21/2022 1129)  midazolam (VERSED) bolus via infusion 0-5 mg (has no administration in time range)  0.9 %  sodium chloride infusion (250 mLs Intravenous Not Given 03/16/2022 1525)  lactated ringers infusion ( Intravenous Not Given 04/04/2022 1626)  vancomycin (VANCOREADY) IVPB 500 mg/100 mL (has no administration in time  range)  meropenem (MERREM) 1 g in sodium chloride 0.9 % 100 mL IVPB (0 g Intravenous Stopped 04/06/2022 1503)  norepinephrine (LEVOPHED) 16 mg in (0.064 mg/mL) premix infusion (38 mcg/min Intravenous New Bag/Given 03/18/2022 1624)  docusate sodium (COLACE) capsule 100 mg (has no administration in time range)  polyethylene glycol (MIRALAX / GLYCOLAX) packet 17 g (has no administration in time range)  heparin injection 5,000 Units (has no administration in time range)  famotidine (PEPCID) tablet 20 mg (has no administration in time range)  0.9 %  sodium chloride infusion (250 mLs Intravenous Not Given  03/26/2022 1626)  lactated ringers infusion (has no administration in time range)    Followed by  lactated ringers infusion (has no administration in time range)  insulin aspart (novoLOG) injection 0-9 Units (has no administration in time range)  potassium chloride 10 mEq in 50 mL *CENTRAL LINE* IVPB (has no administration in time range)  potassium chloride (KLOR-CON) packet 40 mEq (has no administration in time range)  vasopressin (PITRESSIN) 20 Units in sodium chloride 0.9 % 100 mL infusion-*FOR SHOCK* (has no administration in time range)  sodium chloride 0.9 % bolus 1,000 mL (0 mLs Intravenous Stopped 03/20/2022 1145)    And  sodium chloride 0.9 % bolus 250 mL (0 mLs Intravenous Stopped 03/25/2022 1234)  ceFEPIme (MAXIPIME) 2 g in sodium chloride 0.9 % 100 mL IVPB (0 g Intravenous Stopped 03/24/2022 1334)  metroNIDAZOLE (FLAGYL) IVPB 500 mg (0 mg Intravenous Stopped 03/16/2022 1526)  vancomycin (VANCOCIN) IVPB 1000 mg/200 mL premix (0 mg Intravenous Stopped 03/26/2022 1626)  naloxone (NARCAN) injection 2 mg (2 mg Intravenous Given 03/23/2022 1108)  sodium chloride 0.9 % bolus 1,000 mL (0 mLs Intravenous Stopped 03/30/2022 1504)  hydrocortisone sodium succinate (SOLU-CORTEF) 100 MG injection 100 mg (100 mg Intravenous Given 03/17/2022 1439)    ED Course/ Medical Decision Making/ A&P                             Medical Decision Making Amount and/or Complexity of Data Reviewed Labs: ordered. Radiology: ordered. ECG/medicine tests: ordered.  Risk Prescription drug management. Decision regarding hospitalization.   This patient presents to the ED for concern of altered mental status, this involves an extensive number of treatment options, and is a complaint that carries with it a high risk of complications and morbidity.  The differential diagnosis includes CVA, seizure, sepsis, ICH, polypharmacy, medication withdrawal   Co morbidities that complicate the patient evaluation  progressive neurologic decline,  HTN, CAD, BPH, DM, HLD, urine retention, GI bleeding, anemia, CHF, arthritis   Additional history obtained:  Additional history obtained from EMS External records from outside source obtained and reviewed including EMR   Lab Tests:  I Ordered, and personally interpreted labs.  The pertinent results include: Baseline anemia, no leukocytosis, mild hypokalemia with otherwise normal electrolytes, anion gap metabolic acidosis with large elevation in lactate.  Urinalysis consistent with UTI.  Troponin initially mildly elevated and increased on delta.   Imaging Studies ordered:  I ordered imaging studies including chest x-ray, CT head, CT of chest, abdomen and pelvis I independently visualized and interpreted imaging which showed no acute findings on CT head.  CT of CAP shows right lower lobe consolidation consistent with pneumonia and/or aspiration, right perinephric stranding consistent with pyelonephritis.  Bladder calculi suggest possibly recently cleared ureteral stone. I agree with the radiologist interpretation   Cardiac Monitoring: / EKG:  The patient was maintained on a  cardiac monitor.  I personally viewed and interpreted the cardiac monitored which showed an underlying rhythm of: Sinus rhythm   Consultations Obtained:  I requested consultation with the intensivist,  and discussed lab and imaging findings as well as pertinent plan - they recommend: Admission to ICU   Problem List / ED Course / Critical interventions / Medication management  Patient presents for altered mental status.  He is reportedly conversant at baseline.  Nursing staff noted that he was in his normal mentation this morning and had a gradual decline.  He was unresponsive with EMS.  He is unresponsive on arrival in the ED.  His eyes are fixed open but he does not respond to any stimuli.  He has intermittent agonal respirations.  Breathing was assisted with BVM.  Patient was found to be profoundly hypotensive.   IV fluid bolus and Levophed were initiated.  He continued to receive BVM while blood pressures improved.  He was given a dose of Narcan and did seem to have increased rate of respirations and did begin blinking his eyes.  He remained unresponsive.  Blood pressures improved to 130s over 80s.  He was intubated.  Postintubation, he had a drop in his blood pressure again.  Levophed was increased.  Patient to undergo septic workup, treatment, and imaging studies.  Broad-spectrum antibiotics were ordered.  Patient received 2 L of IVF.  Patient required high doses of Levophed.  80 mcg/min, pressure remained low in the 80s over 30s.  Central line was placed.  Additional pressors initiated.  I spoke with his sister, Ms. Adonis HugueninMonika Kache over the telephone.  Patient's sister currently lives in [place].  She is the closest current relative to him.  She is his medical decision-maker in circumstances such as this.  She stated that he never specified DNI.  He did state that he would not want CPR if his heart were to stop.  Patient remained DNR and full medical management to be continued.  Ms. Lavetta NielsenKache was informed of his very critical condition.  She states that she will be available 24 hours a day for telephone updates and further discussions.  She also stated that she will try to arrange travel to come see him in the hospital.  On CT imaging, there is evidence of right lower lobe pneumonia and right-sided pyelonephritis.  No acute intracranial abnormalities were identified.  At around 1230, patient was given a stress dose steroid.  He subsequently had remarkable improvement in his blood pressures.  He was able to be weaned down on his Levophed.  Vasopressin was discontinued.  He will have intermittent episodes of hypotension when he has a bowel movement.  This resolves within minutes.  Nursing was advised to advance NG tube to help decompress his stomach.  Patient was admitted to ICU for further management. I ordered medication  including IV fluids, Levophed, vasopressin, Solu-Cortef for hypotension; broad-spectrum antibiotics for empiric treatment of sepsis; etomidate and rocuronium for RSI; Versed for sedation Reevaluation of the patient after these medicines showed that the patient improved I have reviewed the patients home medicines and have made adjustments as needed   Social Determinants of Health:  Resides in skilled nursing facility  CRITICAL CARE Performed by: Gloris Manchesteryan Jerolene Kupfer   Total critical care time: 45 minutes  Critical care time was exclusive of separately billable procedures and treating other patients.  Critical care was necessary to treat or prevent imminent or life-threatening deterioration.  Critical care was time spent personally by me on the  following activities: development of treatment plan with patient and/or surrogate as well as nursing, discussions with consultants, evaluation of patient's response to treatment, examination of patient, obtaining history from patient or surrogate, ordering and performing treatments and interventions, ordering and review of laboratory studies, ordering and review of radiographic studies, pulse oximetry and re-evaluation of patient's condition.          Final Clinical Impression(s) / ED Diagnoses Final diagnoses:  Septic shock (HCC)  Pneumonia of right lower lobe due to infectious organism  Pyelonephritis    Rx / DC Orders ED Discharge Orders     None         Gloris Manchesterixon, Lachandra Dettmann, MD 03/13/2022 1631

## 2022-04-10 NOTE — ED Notes (Signed)
2mg  narcan given IVP per verbal order MD Doren Custard

## 2022-04-10 NOTE — Progress Notes (Addendum)
Pharmacy Antibiotic Note  Charles [name] is a 62 y.o. male admitted on 02-May-2022 with sepsis.  Pharmacy has been consulted for Vancomycin and Meropenem dosing.  Calculated as 66 mL/min based on Scr 0.7; however, patient is frail and UOP not documented to get a better idea of renal function  History of ESBL E. Coli infections. Switching from cefepime to meropenem.  Plan: Vancomycin 1000mg  IV once then 500mg  Q12H (eAUC 429, Scr rounded up to 0.8, Vd 0.72) Meropenem 1g Q8H  Low threshold for dose adjustment given frailty - Scr may not be indicative of true renal function Monitor cultures for de-escalation.     No data recorded.  Recent Labs  Lab 04/05/22 1356 05/02/2022 1116  WBC 11.1*  --   CREATININE 0.57* 0.70    CrCl cannot be calculated (Unknown ideal weight.).    No Known Allergies   Thank you for allowing pharmacy to be a part of this patient's care.  Merrilee Jansky, PharmD Clinical Pharmacist 05-02-2022 11:30 AM

## 2022-04-11 ENCOUNTER — Inpatient Hospital Stay (HOSPITAL_COMMUNITY): Payer: 59

## 2022-04-11 DIAGNOSIS — R6521 Severe sepsis with septic shock: Secondary | ICD-10-CM

## 2022-04-11 DIAGNOSIS — J189 Pneumonia, unspecified organism: Secondary | ICD-10-CM | POA: Diagnosis present

## 2022-04-11 DIAGNOSIS — A419 Sepsis, unspecified organism: Secondary | ICD-10-CM

## 2022-04-11 DIAGNOSIS — N12 Tubulo-interstitial nephritis, not specified as acute or chronic: Secondary | ICD-10-CM

## 2022-04-11 LAB — POCT I-STAT 7, (LYTES, BLD GAS, ICA,H+H)
Acid-base deficit: 8 mmol/L — ABNORMAL HIGH (ref 0.0–2.0)
Acid-base deficit: 7 mmol/L — ABNORMAL HIGH (ref 0.0–2.0)
Bicarbonate: 14.3 mmol/L — ABNORMAL LOW (ref 20.0–28.0)
Bicarbonate: 16.1 mmol/L — ABNORMAL LOW (ref 20.0–28.0)
Calcium, Ion: 1.19 mmol/L (ref 1.15–1.40)
Calcium, Ion: 1.18 mmol/L (ref 1.15–1.40)
HCT: 26 % — ABNORMAL LOW (ref 39.0–52.0)
HCT: 24 % — ABNORMAL LOW (ref 39.0–52.0)
Hemoglobin: 8.8 g/dL — ABNORMAL LOW (ref 13.0–17.0)
Hemoglobin: 8.2 g/dL — ABNORMAL LOW (ref 13.0–17.0)
O2 Saturation: 100 %
O2 Saturation: 100 %
Patient temperature: 37.1
Patient temperature: 37.2
Potassium: 4.2 mmol/L (ref 3.5–5.1)
Potassium: 4.1 mmol/L (ref 3.5–5.1)
Sodium: 133 mmol/L — ABNORMAL LOW (ref 135–145)
Sodium: 133 mmol/L — ABNORMAL LOW (ref 135–145)
TCO2: 15 mmol/L — ABNORMAL LOW (ref 22–32)
TCO2: 17 mmol/L — ABNORMAL LOW (ref 22–32)
pCO2 arterial: 20.9 mmHg — ABNORMAL LOW (ref 32–48)
pCO2 arterial: 23.7 mmHg — ABNORMAL LOW (ref 32–48)
pH, Arterial: 7.443 (ref 7.35–7.45)
pH, Arterial: 7.442 (ref 7.35–7.45)
pO2, Arterial: 178 mmHg — ABNORMAL HIGH (ref 83–108)
pO2, Arterial: 175 mmHg — ABNORMAL HIGH (ref 83–108)

## 2022-04-11 LAB — BLOOD CULTURE ID PANEL (REFLEXED) - BCID2

## 2022-04-11 LAB — LACTIC ACID, PLASMA
Lactic Acid, Venous: 2.7 mmol/L (ref 0.5–1.9)
Lactic Acid, Venous: 2 mmol/L (ref 0.5–1.9)
Lactic Acid, Venous: 2.2 mmol/L (ref 0.5–1.9)
Lactic Acid, Venous: 2.2 mmol/L (ref 0.5–1.9)

## 2022-04-11 LAB — HEPARIN LEVEL (UNFRACTIONATED)
Heparin Unfractionated: 0.57 IU/mL (ref 0.30–0.70)
Heparin Unfractionated: 0.61 IU/mL (ref 0.30–0.70)

## 2022-04-11 LAB — BASIC METABOLIC PANEL
Anion gap: 10 (ref 5–15)
BUN: 14 mg/dL (ref 6–20)
CO2: 18 mmol/L — ABNORMAL LOW (ref 22–32)
Calcium: 7.4 mg/dL — ABNORMAL LOW (ref 8.9–10.3)
Chloride: 103 mmol/L (ref 98–111)
Creatinine, Ser: 0.81 mg/dL (ref 0.61–1.24)
GFR, Estimated: >60 mL/min (ref >60)
Glucose, Bld: 207 mg/dL — ABNORMAL HIGH (ref 70–99)
Potassium: 4 mmol/L (ref 3.5–5.1)
Sodium: 131 mmol/L — ABNORMAL LOW (ref 135–145)

## 2022-04-11 LAB — GLUCOSE, CAPILLARY
Glucose-Capillary: 138 mg/dL — ABNORMAL HIGH (ref 70–99)
Glucose-Capillary: 92 mg/dL (ref 70–99)
Glucose-Capillary: 61 mg/dL — ABNORMAL LOW (ref 70–99)
Glucose-Capillary: 96 mg/dL (ref 70–99)
Glucose-Capillary: 120 mg/dL — ABNORMAL HIGH (ref 70–99)
Glucose-Capillary: 75 mg/dL (ref 70–99)
Glucose-Capillary: 87 mg/dL (ref 70–99)

## 2022-04-11 LAB — PHOSPHORUS: Phosphorus: 2.6 mg/dL (ref 2.5–4.6)

## 2022-04-11 LAB — TROPONIN I (HIGH SENSITIVITY)
Troponin I (High Sensitivity): 592 ng/L (ref <18)
Troponin I (High Sensitivity): 752 ng/L (ref <18)
Troponin I (High Sensitivity): 928 ng/L (ref <18)
Troponin I (High Sensitivity): 945 ng/L (ref <18)

## 2022-04-11 LAB — CBC
HCT: 26.5 % — ABNORMAL LOW (ref 39.0–52.0)
Hemoglobin: 8.7 g/dL — ABNORMAL LOW (ref 13.0–17.0)
MCH: 26.9 pg (ref 26.0–34.0)
MCHC: 32.8 g/dL (ref 30.0–36.0)
MCV: 82 fL (ref 80.0–100.0)
Platelets: 154 10*3/uL (ref 150–400)
RBC: 3.23 MIL/uL — ABNORMAL LOW (ref 4.22–5.81)
RDW: 17.9 % — ABNORMAL HIGH (ref 11.5–15.5)
WBC: 36.3 10*3/uL — ABNORMAL HIGH (ref 4.0–10.5)
nRBC: 0 % (ref 0.0–0.2)

## 2022-04-11 LAB — STREP PNEUMONIAE URINARY ANTIGEN: Strep Pneumo Urinary Antigen: NEGATIVE

## 2022-04-11 LAB — PROCALCITONIN: Procalcitonin: 118.47 ng/mL

## 2022-04-11 LAB — MAGNESIUM: Magnesium: 1.8 mg/dL (ref 1.7–2.4)

## 2022-04-11 LAB — ALBUMIN: Albumin: <1.5 g/dL — ABNORMAL LOW (ref 3.5–5.0)

## 2022-04-11 MED ORDER — STERILE WATER FOR INJECTION IJ SOLN
INTRAMUSCULAR | Status: AC
  Administered 2022-04-11: 2 mL
  Filled 2022-04-11: qty 10

## 2022-04-11 MED ORDER — HYDROCORTISONE SOD SUC (PF) 100 MG IJ SOLR
100.0000 mg | Freq: Three times a day (TID) | INTRAMUSCULAR | Status: DC
  Administered 2022-04-11: 100 mg via INTRAVENOUS
  Filled 2022-04-11: qty 2

## 2022-04-11 MED ORDER — SODIUM CHLORIDE 0.9 % IV SOLN: INTRAVENOUS | Status: DC | PRN

## 2022-04-11 MED ORDER — HEPARIN (PORCINE) 25000 UT/250ML-% IV SOLN
500.0000 [IU]/h | INTRAVENOUS | Status: DC
  Administered 2022-04-11: 500 [IU]/h via INTRAVENOUS
  Filled 2022-04-11: qty 250

## 2022-04-11 MED ORDER — ADULT MULTIVITAMIN W/MINERALS CH
1.0000 | ORAL_TABLET | Freq: Every day | ORAL | Status: DC
  Administered 2022-04-11 – 2022-04-17 (×7): 1
  Filled 2022-04-11 (×7): qty 1

## 2022-04-11 MED ORDER — CALCIUM GLUCONATE-NACL 1-0.675 GM/50ML-% IV SOLN
1.0000 g | Freq: Once | INTRAVENOUS | Status: AC
  Administered 2022-04-11: 1000 mg via INTRAVENOUS
  Filled 2022-04-11: qty 50

## 2022-04-11 MED ORDER — MAGNESIUM SULFATE 2 GM/50ML IV SOLN
2.0000 g | Freq: Once | INTRAVENOUS | Status: AC
  Administered 2022-04-11: 2 g via INTRAVENOUS
  Filled 2022-04-11: qty 50

## 2022-04-11 MED ORDER — DEXTROSE 50 % IV SOLN
12.5000 g | INTRAVENOUS | Status: AC
  Administered 2022-04-11: 12.5 g via INTRAVENOUS

## 2022-04-11 MED ORDER — EPINEPHRINE 0.1 MG/10ML (10 MCG/ML) SYRINGE FOR IV PUSH (FOR BLOOD PRESSURE SUPPORT): 5.0000 ug | PREFILLED_SYRINGE | Freq: Once | INTRAVENOUS | Status: DC | PRN

## 2022-04-11 MED ORDER — LACTATED RINGERS IV BOLUS
1000.0000 mL | Freq: Once | INTRAVENOUS | Status: AC
  Administered 2022-04-11: 1000 mL via INTRAVENOUS

## 2022-04-11 MED ORDER — EPINEPHRINE HCL 5 MG/250ML IV SOLN IN NS
0.5000 ug/min | INTRAVENOUS | Status: DC
  Administered 2022-04-11: 0.5 ug/min via INTRAVENOUS
  Administered 2022-04-11: 13 ug/min via INTRAVENOUS
  Administered 2022-04-12: 3 ug/min via INTRAVENOUS
  Filled 2022-04-11 (×2): qty 250

## 2022-04-11 MED ORDER — INSULIN GLARGINE-YFGN 100 UNIT/ML ~~LOC~~ SOLN: 8.0000 [IU] | Freq: Every day | SUBCUTANEOUS | Status: DC

## 2022-04-11 MED ORDER — VANCOMYCIN HCL 750 MG/150ML IV SOLN: 750.0000 mg | INTRAVENOUS | Status: DC

## 2022-04-11 MED ORDER — CHLORHEXIDINE GLUCONATE CLOTH 2 % EX PADS
6.0000 | MEDICATED_PAD | Freq: Every day | CUTANEOUS | Status: DC
  Administered 2022-04-11 – 2022-04-18 (×8): 6 via TOPICAL

## 2022-04-11 MED ORDER — HEPARIN BOLUS VIA INFUSION
2500.0000 [IU] | Freq: Once | INTRAVENOUS | Status: AC
  Administered 2022-04-11: 2500 [IU] via INTRAVENOUS
  Filled 2022-04-11: qty 2500

## 2022-04-11 MED ORDER — THIAMINE MONONITRATE 100 MG PO TABS
100.0000 mg | ORAL_TABLET | Freq: Every day | ORAL | Status: AC
  Administered 2022-04-11 – 2022-04-17 (×7): 100 mg
  Filled 2022-04-11 (×7): qty 1

## 2022-04-11 MED ORDER — HYDROCORTISONE SOD SUC (PF) 100 MG IJ SOLR
100.0000 mg | Freq: Two times a day (BID) | INTRAMUSCULAR | Status: DC
  Administered 2022-04-11 – 2022-04-15 (×8): 100 mg via INTRAVENOUS
  Filled 2022-04-11 (×8): qty 2

## 2022-04-11 MED ORDER — DEXTROSE 50 % IV SOLN
INTRAVENOUS | Status: AC
  Filled 2022-04-11: qty 50

## 2022-04-11 NOTE — Progress Notes (Signed)
ANTICOAGULATION CONSULT NOTE   Pharmacy Consult for heparin Indication: chest pain/ACS  No Known Allergies  Patient Measurements: Height: 5\' 6"  (167.6 cm) Weight: 44.5 kg (98 lb 1.7 oz) IBW/kg (Calculated) : 63.8 Heparin Dosing Weight: 44.5 kg  Vital Signs: Temp: 98.8 F (37.1 C) (01/30 1800) Temp Source: Esophageal (01/30 1200) BP: 108/81 (01/30 1030) Pulse Rate: 93 (01/30 1715)  Labs: Recent Labs    05/08/2022 1125 05-08-22 1233 May 08, 2022 1732 2022/05/08 2045 04/11/22 0040 04/11/22 0151 04/11/22 0152 04/11/22 0815 04/11/22 1111 04/11/22 1138 04/11/22 1220 04/11/22 1729  HGB 9.0*   < > 8.9*  --  8.7*  --  8.8*  --   --  8.2*  --   --   HCT 28.8*   < > 28.2*  --  26.5*  --  26.0*  --   --  24.0*  --   --   PLT 218  --  158  --  154  --   --   --   --   --   --   --   LABPROT 15.2  --   --   --   --   --   --   --   --   --   --   --   INR 1.2  --   --   --   --   --   --   --   --   --   --   --   HEPARINUNFRC  --   --   --   --   --   --   --   --   --   --   --  0.57  CREATININE 0.99  --  0.99  --  0.81  --   --   --   --   --   --   --   TROPONINIHS 35*   < >  --    < >  --    < >  --  752* 928*  --  945*  --    < > = values in this interval not displayed.     Estimated Creatinine Clearance: 61.8 mL/min (by C-G formula based on SCr of 0.81 mg/dL).    Assessment: 62 YO male with concern for ACS--troponin 35>>752 since admission on 05/08/22. Patient has a history of CAD s/p CABG in 2004--on aspirin monotherapy at home. No anticoagulation PTA. Pharmacy consulted for heparin management.   Heparin level therapeutic (0.57) on infusion at 500 units/hr. No bleeding noted.  Goal of Therapy:  Heparin level 0.3-0.7 units/ml Monitor platelets by anticoagulation protocol: Yes   Plan:  Continue  heparin gtt at 500 units/hr  F/u confirmatory heparin level in 6 hours  Sherlon Handing, PharmD, BCPS Please see amion for complete clinical pharmacist phone list 1/30/20246:22  PM

## 2022-04-11 NOTE — Progress Notes (Signed)
[facility] ADULT ICU REPLACEMENT PROTOCOL   The patient does apply for the [facility] Adult ICU Electrolyte Replacment Protocol based on the criteria listed below:   1.Exclusion criteria: TCTS, ECMO, Dialysis, and Myasthenia Gravis patients 2. Is GFR >/= 30 ml/min? Yes.    Patient's GFR today is >60 3. Is SCr </= 2? Yes.   Patient's SCr is 0.81 mg/dL 4. Did SCr increase >/= 0.5 in 24 hours? No. 5.Pt's weight >40kg  Yes.   6. Abnormal electrolyte(s):   Mg 1.8  7. Electrolytes replaced per protocol 8.  Call MD STAT for K+ </= 2.5, Phos </= 1, or Mag </= 1 Physician:  A. Paliwala  Aracelli Woloszyn R Talene Glastetter 04/11/2022 1:28 AM

## 2022-04-11 NOTE — Progress Notes (Signed)
Initial Nutrition Assessment  DOCUMENTATION CODES:   Underweight, Severe malnutrition in context of chronic illness  INTERVENTION:  Once ok per CCM, initiate tube feeding via OGT: Osmolite 1.5 at 50 ml/h (1200 ml per day) Start at 8mL/h and advance by 27mL q6h to goal of 50 Prosource TF20 60 ml 1x/d Provides 1880 kcal, 95 gm protein, 914 ml free water daily Pt is severely malnourished and at risk for refeeding. Recommend 100mg  of thiamine daily and once feeds begin, monitor for electrolyte derangements (Mg, phosphorus, K) and replace as needed x 72 hours MVI with minerals daily  NUTRITION DIAGNOSIS:   Severe Malnutrition related to chronic illness as evidenced by severe fat depletion, severe muscle depletion.  GOAL:   Patient will meet greater than or equal to 90% of their needs  MONITOR:   Labs, Vent status, TF tolerance, I & O's  REASON FOR ASSESSMENT:   Ventilator    ASSESSMENT:   Pt with hx of dementia, DM type 2, HTN, and CHF presented to ED from SNF unresponsive. Pt found to be in septic shock and intubated.   Patient is currently intubated on ventilator support. No family at bedside to provide a hx. Pt extremely thin with loss of muscle and fat stores. Meets criteria for malnutrition.   OGT in place and per XR terminates in the gastric antrum. Discussed with provider, does not want to initiate feeds today, plans to re-evaluate tomorrow. Advised that TF recommendations would be available if needed. Pt had a hypoglycemic episode earlier today but was quite hyperglycemic on admission.   Pt currently on pressor support x 3, rates are stable/decreasing. Lactic acid also trending down. Pt would benefit from enteral nutrition in the next 24 hours as he is severely malnourished.  MV: 11.3 L/min Temp (24hrs), Avg:98.5 F (36.9 C), Min:93.6 F (34.2 C), Max:100.8 F (38.2 C)   Intake/Output Summary (Last 24 hours) at 04/11/2022 1514 Last data filed at 04/11/2022  1200 Gross per 24 hour  Intake 6469.96 ml  Output 720 ml  Net 5749.96 ml  Net IO Since Admission: 6,749.96 mL [04/11/22 1514]  Nutritionally Relevant Medications: Scheduled Meds:  famotidine  20 mg Per Tube BID   insulin aspart  0-9 Units Subcutaneous Q4H   Continuous Infusions:  epinephrine 6 mcg/min (04/11/22 1200)   lactated ringers 75 mL/hr at 04/11/22 1200   meropenem (MERREM) IV 1 g (04/11/22 1437)   norepinephrine (LEVOPHED) Adult infusion 40 mcg/min (04/11/22 1200)   vasopressin 0.03 Units/min (04/11/22 1200)   PRN Meds: docusate sodium, polyethylene glycol  Labs Reviewed: Na 133 CBG ranges from 61-380 mg/dL over the last 24 hours  NUTRITION - FOCUSED PHYSICAL EXAM:  Flowsheet Row Most Recent Value  Orbital Region Severe depletion  Upper Arm Region Severe depletion  Thoracic and Lumbar Region Severe depletion  Buccal Region Severe depletion  Temple Region Moderate depletion  Clavicle Bone Region Severe depletion  Clavicle and Acromion Bone Region Severe depletion  Scapular Bone Region Severe depletion  Dorsal Hand Mild depletion  Patellar Region Severe depletion  Anterior Thigh Region Severe depletion  Posterior Calf Region Severe depletion  Edema (RD Assessment) None  Hair Reviewed  Eyes Reviewed  Mouth Reviewed  Skin Reviewed  Nails Reviewed    Diet Order:   Diet Order             Diet NPO time specified  Diet effective now  EDUCATION NEEDS:   Not appropriate for education at this time  Skin:  Skin Assessment: Reviewed RN Assessment  Last BM:  unsure  Height:   Ht Readings from Last 1 Encounters:  04/11/22 5\' 6"  (1.676 m)    Weight:   Wt Readings from Last 1 Encounters:  04/11/22 44.5 kg    Ideal Body Weight:  64.5 kg  BMI:  Body mass index is 15.83 kg/m.  Estimated Nutritional Needs:   Kcal:  1600-1800 kcal/d  Protein:  80-100g/d  Fluid:  1.6-1.8L/d    Ranell Patrick, RD, LDN Clinical  Dietitian RD pager # available in [facility]  After hours/weekend pager # available in [facility]

## 2022-04-11 NOTE — Progress Notes (Signed)
NAME:  Charles [name], MRN:  [id number], DOB:  1960-11-30, LOS: 1 ADMISSION DATE:  04/20/22, CONSULTATION DATE:  20-Apr-2022 REFERRING MD:  Doren Custard EDP, CHIEF COMPLAINT:  Unresponsiveness    History of Present Illness:  Charles [name] 62 yo male with a PMH of dementia with progressive neurological decline, DM, chronic D-HF, CAD s/p CABG in 2004, chronic urinary retention w/ indwelling foley, with hx of recurrent UTIs, who presented to [facility] from SNF ([facility]) unresponsive. Per note, pt is normally alert, and SNF staff reports he was normal this morning eating breakfast and then was found unresponsive and hypotensive after breakfast. On arrival to ED patient SBP in the 30's, and thus was intubated on arrival. Further chart review shows he was recently hospitalized November 2023 with sepsis and bacterima d/t UTI as well as December 2023-January 2024 for pyelonephritis.   ED workup reveled K+ 3.2, Total Co2 17, Albumin 1.7, Anion gap 17, Trop 35, LA 7.8, RVP negative. UA with leukocytes, protein, and bacteria. Blood cultures pending. CXR right base atelectasis, CT Head with NAICA with an unchanged 4-75mm colloid cyst. CT Chest Abd Pelvis with Right lower lobe consolidation concerning for PNA vs Asp PNA.   Called sister [name] Psychologist, educational) who lives in [place] to discuss patients status. [name] is concerned and explains that she feel that this was a rather sudden change from when she last talk to him on Friday (04/08/2022). She said she is aware of the patients wishes for not wanting any advanced life sustaining measures, but given the acute change, she would like to continue treating him, however, does not want any CPR in the event that his heart would stop. She is aware of the severity of [name]'s current state, and would like to given him the best chance of recovery as possible. Per notes on 03/14/2022, goals outlined where patient would prefer to die a natural death. Pt has been by palliative care back in  08/2021 where his GOC are outlined with NO feeding Tube.   PCCM consulted for further management.   Pertinent  Medical History   Past Medical History:  Diagnosis Date   Arthritis    Chronic diastolic heart failure (HCC)    Dementia (HCC)    Diabetes mellitus without complication ([place])    Heart disease    History of recurrent UTIs    (has chronic indwelling foley catheter)   Hypertension     Significant Hospital Events: Including procedures, antibiotic start and stop dates in addition to other pertinent events   04/20/2022 - Arrived to [facility], unresponsive, hypotensive, intubated, CL placed, started on NE and Vaso. On Meropenem, Flagyl, Vancomycin 04/11/22 Added Epi gtt overnight  Interim History / Subjective:  Pt needing increased pressors. Added epi gtt. Pt still ventilated and resting in the bed.   Objective   Blood pressure (!) 83/66, pulse (!) 133, temperature (!) 100.4 F (38 C), resp. rate (!) 24, weight 44.5 kg, SpO2 100 %.    Vent Mode: PRVC FiO2 (%):  [40 %-100 %] 40 % Set Rate:  [16 bmp-18 bmp] 16 bmp Vt Set:  [530 mL] 530 mL PEEP:  [5 cmH20] 5 cmH20 Plateau Pressure:  [13 cmH20-20 cmH20] 20 cmH20   Intake/Output Summary (Last 24 hours) at 04/11/2022 0949 Last data filed at 04/11/2022 2025 Gross per 24 hour  Intake 5825.57 ml  Output 720 ml  Net 5105.57 ml   Filed Weights   04/11/22 0139  Weight: 44.5 kg   Physical Examination: General: Chronically  ill-appearing male in NAD. HEENT: [place]/AT, anicteric sclera, PERRL, moist mucous membranes. Neuro: Comatose. Responds to tactile stimuli. Not following commands.  CV: RRR, no m/g/r. PULM: Breathing even and unlabored on PRVC MV. Lung fields CTAB. GI: Soft, nontender, nondistended. Normoactive bowel sounds. Extremities: no LE edema noted.  CL in Right Femoral, PICC line to Right Arm  Skin: Warm/dry, no rashes.  Procal 118.47 WBC 36.6 Hgb/Hct 8.7/26.5  Na 131 Co2 18 Trop 592 > 752  ABG: 7.44/20.9/178/14.3 Spo2  100  Resolved Hospital Problem list   Hypokalemia  Assessment & Plan:  Septic Shock  ?UTI > likely in the setting of Asp PNA but UA results with with leukocytes, protein, and bacteria as a secondary source of infection  Hypotension > on midodrine  Plan  - On NE (40), Epi (14), Vaso  - Wean pressors to MAP goal >65 - Starting steroids  - Fluid resuscitation as tolerated - Trend WBC, fever curve - BC NGTD - Cont Meropenum and Vancomycin - D/C PICC today  - Will need Aline and obtain ABG  - F/u Urine Cx - Repeat fluid bolus   Acute Respiratory Failure  Asp PNA > likely in the setting of Asp PNA, intubated on 04/06/2022 Plan - LTVV with goal plateau <30 and driving pressure <40. - Minimize sedation  - goal RASS 0 to -1   - Daily SAT & SBT once stable - VAP prevention protocol - PAD protocol - Blood Cx NGTD - F/u Trach Cx - Cont Meropenum and Vancomycin  Acute AGMA Lactic Acidosis Plan - Trend LA - Trend BMP - Replete electrolytes as indicated - Monitor I&Os - Avoid nephrotoxic agents as able - Ensure adequate renal perfusion  Chronic Urinary Retention > Hx of ESBL > UA + with leukocytes, protein, and bacteria  Plan  - Cont foley - Monitor I/O - F/u Urine Cx  CAD s/p CABG Elevated Trops> likely demand ischemia  Plan  - Cont Trend Trop  - ECHO ordered - EKG ordered  - Heparin per pharm consult   - Hold home zetia  - Hold home statin  DM Plan  - SSI - CBGs Q4H - Goal CBG 140-180 - Holding Semglee for now  Hypoalbuminemia  Plan  - NPO for now - Anticipate RD consult in near future  Best Practice (right click and "Reselect all SmartList Selections" daily)   Diet/type: NPO DVT prophylaxis: prophylactic heparin  GI prophylaxis: H2B Lines: Central line Foley:  Yes, and it is still needed Code Status:  DNR Last date of multidisciplinary goals of care discussion [04/05/2022 with [name]]  Labs   CBC: Recent Labs  Lab 04/05/22 1356  04/01/2022 1116 03/13/2022 1125 04/09/2022 1233 03/14/2022 1732 04/11/22 0040 04/11/22 0152  WBC 11.1*  --  6.6  --  37.0* 36.3*  --   NEUTROABS 8.2*  --  6.2  --   --   --   --   HGB 9.5*   < > 9.0* 7.5* 8.9* 8.7* 8.8*  HCT 29.3*   < > 28.8* 22.0* 28.2* 26.5* 26.0*  MCV 81.6  --  86.0  --  85.7 82.0  --   PLT 292  --  218  --  158 154  --    < > = values in this interval not displayed.    Basic Metabolic Panel: Recent Labs  Lab 04/05/22 1356 03/17/2022 1116 03/24/2022 1125 04/02/2022 1233 04/02/2022 1732 04/11/22 0040 04/11/22 0152  NA 138 139  138 137 136  --  131* 133*  K 3.1* 3.2*  3.2* 3.2* 3.2*  --  4.0 4.2  CL 104 104 103  --   --  103  --   CO2 28  --  17*  --   --  18*  --   GLUCOSE 136* 86 86  --   --  207*  --   BUN 11 11 12   --   --  14  --   CREATININE 0.57* 0.70 0.99  --  0.99 0.81  --   CALCIUM 8.2*  --  8.1*  --   --  7.4*  --   MG  --   --  1.7  --   --  1.8  --   PHOS  --   --   --   --   --  2.6  --    GFR: Estimated Creatinine Clearance: 61.8 mL/min (by C-G formula based on SCr of 0.81 mg/dL). Recent Labs  Lab 04/05/22 1356 04/12/2022 1125 03/28/2022 1128 03/26/2022 1419 04/08/2022 1440 03/14/2022 1732 04/11/22 0040  PROCALCITON  --   --   --   --  6.17  --  118.47  WBC 11.1* 6.6  --   --   --  37.0* 36.3*  LATICACIDVEN  --   --  7.8* 5.7*  --   --   --     Liver Function Tests: Recent Labs  Lab 04/05/22 1356 04/07/2022 1125 04/11/22 0151  AST 76* 41  --   ALT 26 13  --   ALKPHOS 95 159*  --   BILITOT 0.2* 0.6  --   PROT 6.7 5.5*  --   ALBUMIN 2.2* 1.7* <1.5*   No results for input(s): "LIPASE", "AMYLASE" in the last 168 hours. No results for input(s): "AMMONIA" in the last 168 hours.  ABG    Component Value Date/Time   PHART 7.443 04/11/2022 0152   PCO2ART 20.9 (L) 04/11/2022 0152   PO2ART 178 (H) 04/11/2022 0152   HCO3 14.3 (L) 04/11/2022 0152   TCO2 15 (L) 04/11/2022 0152   ACIDBASEDEF 8.0 (H) 04/11/2022 0152   O2SAT 100 04/11/2022 0152      Coagulation Profile: Recent Labs  Lab 03/30/2022 1125  INR 1.2    Cardiac Enzymes: No results for input(s): "CKTOTAL", "CKMB", "CKMBINDEX", "TROPONINI" in the last 168 hours.  HbA1C: Hgb A1c MFr Bld  Date/Time Value Ref Range Status  01/27/2022 05:32 AM 7.7 (H) 4.8 - 5.6 % Final    Comment:    (NOTE) Pre diabetes:          5.7%-6.4%  Diabetes:              >6.4%  Glycemic control for   <7.0% adults with diabetes   07/03/2021 01:52 AM 10.4 (H) 4.8 - 5.6 % Final    Comment:    (NOTE) Pre diabetes:          5.7%-6.4%  Diabetes:              >6.4%  Glycemic control for   <7.0% adults with diabetes     CBG: Recent Labs  Lab 03/17/2022 1636 04/12/2022 2040 04/08/2022 2322 04/11/22 0314 04/11/22 0747  GLUCAP 380* 286* 223* 138* 92       Critical care time:     Erma Heritage, NP-S Cct deferred to Attending

## 2022-04-11 NOTE — Progress Notes (Signed)
[facility]) Hospital Liaison Note   Charles [name] is a pending [facility] hospice patient.  Visited patient at bedside. Report exchanged with RN.   Please call with any hospice questions or concerns. Thank you.   Zigmund Gottron, RN [facility] Liaison [phone]

## 2022-04-11 NOTE — Progress Notes (Signed)
PHARMACY - PHYSICIAN COMMUNICATION CRITICAL VALUE ALERT - BLOOD CULTURE IDENTIFICATION (BCID)  Charles [name] is an 62 y.o. male who presented to [facility] on 03/25/2022 with a chief complaint of UTI/asp pna  Assessment:  ESBL Ecoli bacteremia (urinary source likely)  Name of physician (or Provider) Contacted: Dr. Stoney Bang  Current antibiotics: Meropenem and Vancomycin  Changes to prescribed antibiotics recommended:  Discontinue the Vancomycin  Results for orders placed or performed during the hospital encounter of 04/11/2022  Blood Culture ID Panel (Reflexed) (Collected: 04/08/2022 11:28 AM)  Result Value Ref Range   Enterococcus faecalis NOT DETECTED NOT DETECTED   Enterococcus Faecium NOT DETECTED NOT DETECTED   Listeria monocytogenes NOT DETECTED NOT DETECTED   Staphylococcus species NOT DETECTED NOT DETECTED   Staphylococcus aureus (BCID) NOT DETECTED NOT DETECTED   Staphylococcus epidermidis NOT DETECTED NOT DETECTED   Staphylococcus lugdunensis NOT DETECTED NOT DETECTED   Streptococcus species NOT DETECTED NOT DETECTED   Streptococcus agalactiae NOT DETECTED NOT DETECTED   Streptococcus pneumoniae NOT DETECTED NOT DETECTED   Streptococcus pyogenes NOT DETECTED NOT DETECTED   A.calcoaceticus-baumannii NOT DETECTED NOT DETECTED   Bacteroides fragilis NOT DETECTED NOT DETECTED   Enterobacterales DETECTED (A) NOT DETECTED   Enterobacter cloacae complex NOT DETECTED NOT DETECTED   Escherichia coli DETECTED (A) NOT DETECTED   Klebsiella aerogenes NOT DETECTED NOT DETECTED   Klebsiella oxytoca NOT DETECTED NOT DETECTED   Klebsiella pneumoniae NOT DETECTED NOT DETECTED   Proteus species NOT DETECTED NOT DETECTED   Salmonella species NOT DETECTED NOT DETECTED   Serratia marcescens NOT DETECTED NOT DETECTED   Haemophilus influenzae NOT DETECTED NOT DETECTED   Neisseria meningitidis NOT DETECTED NOT DETECTED   Pseudomonas aeruginosa NOT DETECTED NOT DETECTED   Stenotrophomonas  maltophilia NOT DETECTED NOT DETECTED   Candida albicans NOT DETECTED NOT DETECTED   Candida auris NOT DETECTED NOT DETECTED   Candida glabrata NOT DETECTED NOT DETECTED   Candida krusei NOT DETECTED NOT DETECTED   Candida parapsilosis NOT DETECTED NOT DETECTED   Candida tropicalis NOT DETECTED NOT DETECTED   Cryptococcus neoformans/gattii NOT DETECTED NOT DETECTED   CTX-M ESBL DETECTED (A) NOT DETECTED   Carbapenem resistance IMP NOT DETECTED NOT DETECTED   Carbapenem resistance KPC NOT DETECTED NOT DETECTED   Carbapenem resistance NDM NOT DETECTED NOT DETECTED   Carbapenem resist OXA 48 LIKE NOT DETECTED NOT DETECTED   Carbapenem resistance VIM NOT DETECTED NOT DETECTED    Sherlon Handing, PharmD, BCPS Please see amion for complete clinical pharmacist phone list 04/11/2022  8:14 PM

## 2022-04-11 NOTE — Progress Notes (Addendum)
eLink Physician-Brief Progress Note Patient Name: Charles [name] DOB: [birth year] MRN: [id number]   Date of Service  04/11/2022  HPI/Events of Note  Ecoli ESBL bacteremia being treated with meropenem. No AKI but otherwise stable. Recommend DC of Vanc  eICU Interventions  Agree with DC vanc. No repeat cultures are indicated. Source organism being targeted with Mero.   0600: WBCs 60.1 up from 36.3 yesterday.  Pt is on a heparin gtt and platelets are down to 70 from 154. Told nurse to also contact pharmacy since they make the adjustments with the heparin infusions  --> Labs were reviewed, no acute intervention was indicated, continue to monitor, likely consumptive with sepsis  Intervention Category Intermediate Interventions: Medication change / dose adjustment  Charles [name] 04/11/2022, 8:09 PM

## 2022-04-11 NOTE — Procedures (Signed)
Arterial Catheter Insertion Procedure Note  Charles [name]  [id number]  1960-12-13  Date:04/11/22  Time:11:09 AM    Provider Performing: Erma Heritage, NP-S   Procedure: Insertion of Arterial Line [phone]) with US guidance ([id number])   Indication(s) Blood pressure monitoring and/or need for frequent ABGs  Consent Unable to obtain consent due to emergent nature of procedure.  Anesthesia None   Time Out Verified patient identification, verified procedure, site/side was marked, verified correct patient position, special equipment/implants available, medications/allergies/relevant history reviewed, required imaging and test results available.   Sterile Technique Maximal sterile technique including full sterile barrier drape, hand hygiene, sterile gown, sterile gloves, mask, hair covering, sterile ultrasound probe cover (if used).   Procedure Description Area of catheter insertion was cleaned with chlorhexidine and draped in sterile fashion. With real-time ultrasound guidance an arterial catheter was placed into the right Axillary artery.  Appropriate arterial tracings confirmed on monitor.     Complications/Tolerance None; patient tolerated the procedure well.   EBL Minimal   Specimen(s) None

## 2022-04-11 NOTE — Inpatient Diabetes Management (Signed)
Inpatient Diabetes Program Recommendations  AACE/ADA: New Consensus Statement on Inpatient Glycemic Control (2015)  Target Ranges:  Prepandial:   less than 140 mg/dL      Peak postprandial:   less than 180 mg/dL (1-2 hours)      Critically ill patients:  140 - 180 mg/dL   Lab Results  Component Value Date   GLUCAP 92 04/11/2022   HGBA1C 7.7 (H) 01/27/2022    Review of Glycemic Control  Latest Reference Range & Units 04/08/2022 20:40 03/25/2022 23:22 04/11/22 03:14 04/11/22 07:47  Glucose-Capillary 70 - 99 mg/dL 286 (H) 223 (H) 138 (H) 92  (H): Data is abnormally high Diabetes history: Type 2 DM Outpatient Diabetes medications: Semglee 18 units QD, Humalog 3-10 units TID Current orders for Inpatient glycemic control: Novolog 0-9 units Q4H Solucortef 100 mg Q8H  Inpatient Diabetes Program Recommendations:    Consider adding Semglee 8 units QD.   Thanks, Bronson Curb, MSN, RNC-OB Diabetes Coordinator [phone] (8a-5p)

## 2022-04-11 NOTE — Progress Notes (Signed)
eLink Physician-Brief Progress Note Patient Name: Charles [name] DOB: 10/18/1960 MRN: [id number]   Date of Service  04/11/2022  HPI/Events of Note  Refractory shock, Norepi+Vaso, improved hypothermia, satting well, synchronous with vent  eICU Interventions  Add EPI to maintain MAP > 60 Obtain A line Repeat ABG and pull morning labs early. Anticipate patient will need correction of acidosis with bicarb or RRT.     Intervention Category Major Interventions: Hypotension - evaluation and management  Ivi Griffith 04/11/2022, 12:42 AM

## 2022-04-11 NOTE — Progress Notes (Signed)
ANTICOAGULATION CONSULT NOTE - Initial Consult  Pharmacy Consult for heparin Indication: chest pain/ACS  No Known Allergies  Patient Measurements: Weight: 44.5 kg (98 lb 1.7 oz) Heparin Dosing Weight: 44.5 kg  Vital Signs: Temp: 100.4 F (38 C) (01/30 0800) Temp Source: Esophageal (01/30 0000) BP: 83/66 (01/30 0800) Pulse Rate: 133 (01/30 0800)  Labs: Recent Labs    04/09/2022 1125 04/04/2022 1233 04/05/2022 1732 04/06/2022 2045 04/11/22 0040 04/11/22 0151 04/11/22 0152 04/11/22 0815  HGB 9.0*   < > 8.9*  --  8.7*  --  8.8*  --   HCT 28.8*   < > 28.2*  --  26.5*  --  26.0*  --   PLT 218  --  158  --  154  --   --   --   LABPROT 15.2  --   --   --   --   --   --   --   INR 1.2  --   --   --   --   --   --   --   CREATININE 0.99  --  0.99  --  0.81  --   --   --   TROPONINIHS 35*   < >  --  314*  --  592*  --  752*   < > = values in this interval not displayed.    Estimated Creatinine Clearance: 61.8 mL/min (by C-G formula based on SCr of 0.81 mg/dL).   Medical History: Past Medical History:  Diagnosis Date   Arthritis    Chronic diastolic heart failure (HCC)    Dementia (HCC)    Diabetes mellitus without complication ([place])    Heart disease    History of recurrent UTIs    (has chronic indwelling foley catheter)   Hypertension     Medications:  Medications Prior to Admission  Medication Sig Dispense Refill Last Dose   aspirin EC 81 MG tablet Take 1 tablet (81 mg total) by mouth daily. Swallow whole. 30 tablet 11 04/05/2022   docusate sodium (COLACE) 100 MG capsule Take 100 mg by mouth at bedtime.   04/09/2022   dronabinol (MARINOL) 5 MG capsule Take 5 mg by mouth 2 (two) times daily before a meal.   03/24/2022   ezetimibe (ZETIA) 10 MG tablet Take 1 tablet (10 mg total) by mouth daily.   04/03/2022   FeFum-FePoly-FA-B Cmp-C-Biot (INTEGRA PLUS) CAPS Take 1 capsule by mouth every morning. 30 capsule 2 04/09/2022   Glucerna (GLUCERNA) LIQD Take 237 mLs by mouth 3 (three)  times daily with meals.   03/16/2022   insulin glargine-yfgn (SEMGLEE) 100 UNIT/ML injection Inject 0.18 mLs (18 Units total) into the skin daily. 10 mL 11 04/09/2022   insulin lispro (HUMALOG) 200 UNIT/ML KwikPen Inject 3-20 Units into the skin in the morning, at noon, in the evening, and at bedtime. Inject as per sliding scale: if 121-150 = 3 units; 151-200 = 4 units; 201-250 = 7 units; 251-300 = 11 units; 301-350 = 15 units; 351-400 = 20 units. If CBG>400 call MD/NP if < 70 notify MD/NP   03/22/2022   loperamide (IMODIUM) 2 MG capsule Take 1 capsule (2 mg total) by mouth as needed for diarrhea or loose stools. (Patient taking differently: Take 2 mg by mouth daily as needed for diarrhea or loose stools.) 30 capsule 0 unknown   magnesium oxide (MAG-OX) 400 (240 Mg) MG tablet Take 400 mg by mouth daily.   03/20/2022   methenamine (HIPREX)  1 g tablet Take 1 g by mouth 2 (two) times daily.   04/30/22   midodrine (PROAMATINE) 10 MG tablet Take 1 tablet (10 mg total) by mouth 3 (three) times daily with meals.   2022-04-30   Multiple Vitamin (MULTIVITAMIN WITH MINERALS) TABS tablet Take 1 tablet by mouth daily.   04/30/2022   Nutritional Supplements (NUTRITIONAL SHAKE PO) Take 1 Container by mouth with breakfast, with lunch, and with evening meal.   Apr 30, 2022   Nutritional Supplements (NUTRITIONAL SUPPLEMENT PO) Take 1 Container by mouth 2 (two) times daily. Magic cup at lunch and at dinner   04/09/2022   omeprazole (PRILOSEC) 40 MG capsule Take 40 mg by mouth daily.   2022/04/30   oxybutynin (DITROPAN) 5 MG tablet Take 5 mg by mouth 2 (two) times daily.   04-30-2022   oxyCODONE (OXY IR/ROXICODONE) 5 MG immediate release tablet Take 1 tablet (5 mg total) by mouth every 4 (four) hours as needed for moderate pain. 5 tablet 0 unknown   polyethylene glycol (MIRALAX / GLYCOLAX) 17 g packet Take 17 g by mouth daily.   04/30/22   potassium chloride SA (KLOR-CON M) 20 MEQ tablet Take 1 tablet (20 mEq total) by mouth 2  (two) times daily. 14 tablet 0 30-Apr-2022   rosuvastatin (CRESTOR) 20 MG tablet Take 20 mg by mouth daily.   30-Apr-2022   thiamine 100 MG tablet Take 1 tablet (100 mg total) by mouth daily.   04-30-2022   tiZANidine (ZANAFLEX) 2 MG tablet Take 2 mg by mouth every 8 (eight) hours as needed for muscle spasms.   unknown   zinc oxide 20 % ointment Apply 1 Application topically 2 (two) times daily. Apply to scrotum topically every shift for excoriation   04/30/2022   blood glucose meter kit and supplies Dispense based on patient and insurance preference. Use up to four times daily as directed. (FOR ICD-10 E10.9, E11.9). 1 each 11    pantoprazole (PROTONIX) 40 MG tablet Take 1 tablet (40 mg total) by mouth daily. Any generic PPI is okay to dispense (Patient not taking: Reported on 04/05/2022) 30 tablet 1 Not Taking   Scheduled:   Chlorhexidine Gluconate Cloth  6 each Topical Q0600   famotidine  20 mg Per Tube BID   heparin  5,000 Units Subcutaneous Q8H   hydrocortisone sod succinate (SOLU-CORTEF) inj  100 mg Intravenous BID   insulin aspart  0-9 Units Subcutaneous Q4H   mouth rinse  15 mL Mouth Rinse Q2H    Assessment: 62 YO male with concern for ACS--troponin 35>>752 since admission on 04/30/2022. Patient has a history of CAD s/p CABG in 2004--on aspirin monotherapy at home. No anticoagulation PTA. Pharmacy consulted for heparin management.   Hgb 8.7, plts 154--stable. No s/sx of bleeding reported. Will conservatively dose heparin given patient's low body weight and concern for kidney damage with current pressor requirement.   Goal of Therapy:  Heparin level 0.3-0.7 units/ml Monitor platelets by anticoagulation protocol: Yes   Plan:  Give heparin IV 2500mg  x1 Start heparin gtt @500  units/hr  Check heparin level in 6hrs Monitor heparin level, CBC, and s/sx of bleeding daily   Billey Gosling, PharmD PGY1 Pharmacy Resident 1/30/202411:47 AM

## 2022-04-12 ENCOUNTER — Inpatient Hospital Stay (HOSPITAL_COMMUNITY): Payer: 59

## 2022-04-12 DIAGNOSIS — R6521 Severe sepsis with septic shock: Secondary | ICD-10-CM | POA: Diagnosis not present

## 2022-04-12 DIAGNOSIS — R0609 Other forms of dyspnea: Secondary | ICD-10-CM

## 2022-04-12 DIAGNOSIS — J189 Pneumonia, unspecified organism: Secondary | ICD-10-CM | POA: Diagnosis not present

## 2022-04-12 DIAGNOSIS — N12 Tubulo-interstitial nephritis, not specified as acute or chronic: Secondary | ICD-10-CM | POA: Diagnosis not present

## 2022-04-12 DIAGNOSIS — A419 Sepsis, unspecified organism: Secondary | ICD-10-CM | POA: Diagnosis not present

## 2022-04-12 LAB — BASIC METABOLIC PANEL
Anion gap: 13 (ref 5–15)
Anion gap: 12 (ref 5–15)
BUN: 20 mg/dL (ref 6–20)
BUN: 20 mg/dL (ref 6–20)
CO2: 12 mmol/L — ABNORMAL LOW (ref 22–32)
CO2: 21 mmol/L — ABNORMAL LOW (ref 22–32)
Calcium: 7.1 mg/dL — ABNORMAL LOW (ref 8.9–10.3)
Calcium: 6.7 mg/dL — ABNORMAL LOW (ref 8.9–10.3)
Chloride: 107 mmol/L (ref 98–111)
Chloride: 104 mmol/L (ref 98–111)
Creatinine, Ser: 0.98 mg/dL (ref 0.61–1.24)
Creatinine, Ser: 0.95 mg/dL (ref 0.61–1.24)
GFR, Estimated: >60 mL/min (ref >60)
GFR, Estimated: >60 mL/min (ref >60)
Glucose, Bld: 148 mg/dL — ABNORMAL HIGH (ref 70–99)
Glucose, Bld: 145 mg/dL — ABNORMAL HIGH (ref 70–99)
Potassium: 4 mmol/L (ref 3.5–5.1)
Potassium: 3.8 mmol/L (ref 3.5–5.1)
Sodium: 132 mmol/L — ABNORMAL LOW (ref 135–145)
Sodium: 137 mmol/L (ref 135–145)

## 2022-04-12 LAB — POCT I-STAT 7, (LYTES, BLD GAS, ICA,H+H)
Acid-Base Excess: 14 mmol/L — ABNORMAL HIGH (ref 0.0–2.0)
Acid-base deficit: 6 mmol/L — ABNORMAL HIGH (ref 0.0–2.0)
Bicarbonate: 34.4 mmol/L — ABNORMAL HIGH (ref 20.0–28.0)
Bicarbonate: 17.5 mmol/L — ABNORMAL LOW (ref 20.0–28.0)
Calcium, Ion: 0.98 mmol/L — ABNORMAL LOW (ref 1.15–1.40)
Calcium, Ion: 1.1 mmol/L — ABNORMAL LOW (ref 1.15–1.40)
HCT: 19 % — ABNORMAL LOW (ref 39.0–52.0)
HCT: 21 % — ABNORMAL LOW (ref 39.0–52.0)
Hemoglobin: 6.5 g/dL — CL (ref 13.0–17.0)
Hemoglobin: 7.1 g/dL — ABNORMAL LOW (ref 13.0–17.0)
O2 Saturation: 100 %
O2 Saturation: 100 %
Patient temperature: 98.2
Patient temperature: 36.9
Potassium: 3.9 mmol/L (ref 3.5–5.1)
Potassium: 3.8 mmol/L (ref 3.5–5.1)
Sodium: 144 mmol/L (ref 135–145)
Sodium: 136 mmol/L (ref 135–145)
TCO2: 35 mmol/L — ABNORMAL HIGH (ref 22–32)
TCO2: 18 mmol/L — ABNORMAL LOW (ref 22–32)
pCO2 arterial: 25.2 mmHg — ABNORMAL LOW (ref 32–48)
pCO2 arterial: 24.8 mmHg — ABNORMAL LOW (ref 32–48)
pH, Arterial: 7.744 (ref 7.35–7.45)
pH, Arterial: 7.455 — ABNORMAL HIGH (ref 7.35–7.45)
pO2, Arterial: 192 mmHg — ABNORMAL HIGH (ref 83–108)
pO2, Arterial: 188 mmHg — ABNORMAL HIGH (ref 83–108)

## 2022-04-12 LAB — CBC
HCT: 22.1 % — ABNORMAL LOW (ref 39.0–52.0)
Hemoglobin: 7.7 g/dL — ABNORMAL LOW (ref 13.0–17.0)
MCH: 27.5 pg (ref 26.0–34.0)
MCHC: 34.8 g/dL (ref 30.0–36.0)
MCV: 78.9 fL — ABNORMAL LOW (ref 80.0–100.0)
Platelets: 70 10*3/uL — ABNORMAL LOW (ref 150–400)
RBC: 2.8 MIL/uL — ABNORMAL LOW (ref 4.22–5.81)
RDW: 18.1 % — ABNORMAL HIGH (ref 11.5–15.5)
WBC: 60.1 10*3/uL (ref 4.0–10.5)
nRBC: 0 % (ref 0.0–0.2)

## 2022-04-12 LAB — LEGIONELLA PNEUMOPHILA SEROGP 1 UR AG: L. pneumophila Serogp 1 Ur Ag: NEGATIVE

## 2022-04-12 LAB — GLUCOSE, CAPILLARY
Glucose-Capillary: 125 mg/dL — ABNORMAL HIGH (ref 70–99)
Glucose-Capillary: 109 mg/dL — ABNORMAL HIGH (ref 70–99)
Glucose-Capillary: 125 mg/dL — ABNORMAL HIGH (ref 70–99)
Glucose-Capillary: 103 mg/dL — ABNORMAL HIGH (ref 70–99)
Glucose-Capillary: 70 mg/dL (ref 70–99)
Glucose-Capillary: 93 mg/dL (ref 70–99)
Glucose-Capillary: 97 mg/dL (ref 70–99)

## 2022-04-12 LAB — HEPARIN LEVEL (UNFRACTIONATED): Heparin Unfractionated: 0.44 IU/mL (ref 0.30–0.70)

## 2022-04-12 LAB — ECHOCARDIOGRAM LIMITED
Area-P 1/2: 3.65 cm2
Height: 66 in
Weight: 1633.17 oz

## 2022-04-12 LAB — BCR ABL1 FISH (GENPATH)

## 2022-04-12 LAB — JAK2 (INCLUDING V617F AND EXON 12), MPL,& CALR W/RFL MPN PANEL (NGS)

## 2022-04-12 LAB — PROCALCITONIN: Procalcitonin: >150 ng/mL

## 2022-04-12 LAB — MAGNESIUM: Magnesium: 1.9 mg/dL (ref 1.7–2.4)

## 2022-04-12 LAB — PHOSPHORUS: Phosphorus: 3.9 mg/dL (ref 2.5–4.6)

## 2022-04-12 MED ORDER — DOCUSATE SODIUM 50 MG/5ML PO LIQD
100.0000 mg | Freq: Two times a day (BID) | ORAL | Status: DC
  Administered 2022-04-13 (×2): 100 mg
  Filled 2022-04-12 (×4): qty 10

## 2022-04-12 MED ORDER — AMIODARONE IV BOLUS ONLY 150 MG/100ML
INTRAVENOUS | Status: AC
  Filled 2022-04-12: qty 100

## 2022-04-12 MED ORDER — STERILE WATER FOR INJECTION IV SOLN
INTRAVENOUS | Status: DC
  Filled 2022-04-12: qty 1000

## 2022-04-12 MED ORDER — ENOXAPARIN SODIUM 30 MG/0.3ML IJ SOSY
30.0000 mg | PREFILLED_SYRINGE | INTRAMUSCULAR | Status: DC
  Administered 2022-04-12 – 2022-04-13 (×2): 30 mg via SUBCUTANEOUS
  Filled 2022-04-12 (×3): qty 0.3

## 2022-04-12 MED ORDER — LACTATED RINGERS IV BOLUS
1000.0000 mL | Freq: Once | INTRAVENOUS | Status: AC
  Administered 2022-04-12: 1000 mL via INTRAVENOUS

## 2022-04-12 MED ORDER — DEXMEDETOMIDINE HCL IN NACL 400 MCG/100ML IV SOLN
INTRAVENOUS | Status: AC
  Filled 2022-04-12: qty 100

## 2022-04-12 MED ORDER — MAGNESIUM SULFATE 2 GM/50ML IV SOLN
2.0000 g | Freq: Once | INTRAVENOUS | Status: AC
  Administered 2022-04-12: 2 g via INTRAVENOUS
  Filled 2022-04-12: qty 50

## 2022-04-12 MED ORDER — DEXMEDETOMIDINE HCL IN NACL 400 MCG/100ML IV SOLN
0.0000 ug/kg/h | INTRAVENOUS | Status: DC
  Administered 2022-04-12: 0.4 ug/kg/h via INTRAVENOUS
  Administered 2022-04-12: 1.2 ug/kg/h via INTRAVENOUS
  Administered 2022-04-12: 0.8 ug/kg/h via INTRAVENOUS
  Administered 2022-04-14: 0.2 ug/kg/h via INTRAVENOUS
  Filled 2022-04-12 (×3): qty 100

## 2022-04-12 MED ORDER — FENTANYL CITRATE PF 50 MCG/ML IJ SOSY
50.0000 ug | PREFILLED_SYRINGE | INTRAMUSCULAR | Status: DC | PRN
  Administered 2022-04-16: 50 ug via INTRAVENOUS
  Filled 2022-04-12 (×2): qty 1

## 2022-04-12 MED ORDER — SODIUM BICARBONATE 8.4 % IV SOLN
100.0000 meq | Freq: Once | INTRAVENOUS | Status: AC
  Administered 2022-04-12: 100 meq via INTRAVENOUS

## 2022-04-12 MED ORDER — SODIUM BICARBONATE 8.4 % IV SOLN
INTRAVENOUS | Status: AC
  Filled 2022-04-12: qty 100

## 2022-04-12 MED ORDER — STERILE WATER FOR INJECTION IJ SOLN
INTRAMUSCULAR | Status: AC
  Administered 2022-04-12: 2 mL
  Filled 2022-04-12: qty 10

## 2022-04-12 MED ORDER — AMIODARONE IV BOLUS ONLY 150 MG/100ML
150.0000 mg | Freq: Once | INTRAVENOUS | Status: AC
  Administered 2022-04-12: 150 mg via INTRAVENOUS

## 2022-04-12 MED ORDER — FENTANYL CITRATE PF 50 MCG/ML IJ SOSY
50.0000 ug | PREFILLED_SYRINGE | INTRAMUSCULAR | Status: DC | PRN
  Administered 2022-04-12 – 2022-04-13 (×2): 50 ug via INTRAVENOUS
  Administered 2022-04-15 – 2022-04-17 (×6): 100 ug via INTRAVENOUS
  Filled 2022-04-12 (×2): qty 2
  Filled 2022-04-12: qty 1
  Filled 2022-04-12 (×5): qty 2

## 2022-04-12 MED ORDER — POLYETHYLENE GLYCOL 3350 17 G PO PACK
17.0000 g | PACK | Freq: Every day | ORAL | Status: DC
  Filled 2022-04-12: qty 1

## 2022-04-12 NOTE — Progress Notes (Signed)
[facility] ADULT ICU REPLACEMENT PROTOCOL   The patient does apply for the [facility] Adult ICU Electrolyte Replacment Protocol based on the criteria listed below:   1.Exclusion criteria: TCTS, ECMO, Dialysis, and Myasthenia Gravis patients 2. Is GFR >/= 30 ml/min? Yes.    Patient's GFR today is >60 3. Is SCr </= 2? Yes.   Patient's SCr is 0.98 mg/dL 4. Did SCr increase >/= 0.5 in 24 hours? No. 5.Pt's weight >40kg  Yes.   6. Abnormal electrolyte(s): mag 1.9  7. Electrolytes replaced per protocol 8.  Call MD STAT for K+ </= 2.5, Phos </= 1, or Mag </= 1 Physician:  n/a  Charles [name] 04/12/2022 5:31 AM

## 2022-04-12 NOTE — Procedures (Signed)
Central Venous Catheter Insertion Procedure Note  Charles [name]  [id number]  1960-03-20  Date:04/12/22  Time:3:41 PM   Provider Performing:Netra Postlethwait   Procedure: Insertion of Non-tunneled Central Venous [phone]) with US guidance ([id number])   Indication(s) Medication administration  Consent Risks of the procedure as well as the alternatives and risks of each were explained to the patient and/or caregiver.  Consent for the procedure was obtained and is signed in the bedside chart  Anesthesia Topical only with 1% lidocaine   Timeout Verified patient identification, verified procedure, site/side was marked, verified correct patient position, special equipment/implants available, medications/allergies/relevant history reviewed, required imaging and test results available.  Sterile Technique Maximal sterile technique including full sterile barrier drape, hand hygiene, sterile gown, sterile gloves, mask, hair covering, sterile ultrasound probe cover (if used).  Procedure Description Area of catheter insertion was cleaned with chlorhexidine and draped in sterile fashion.  With real-time ultrasound guidance a central venous catheter was placed into the left internal jugular vein. Nonpulsatile blood flow and easy flushing noted in all ports.  The catheter was sutured in place and sterile dressing applied.  Complications/Tolerance None; patient tolerated the procedure well. Chest X-ray is ordered to verify placement for internal jugular or subclavian cannulation.   Chest x-ray is not ordered for femoral cannulation.  EBL Minimal  Specimen(s) None

## 2022-04-12 NOTE — Progress Notes (Signed)
ANTICOAGULATION CONSULT NOTE   Pharmacy Consult for heparin Indication: chest pain/ACS  No Known Allergies  Patient Measurements: Height: 5\' 6"  (167.6 cm) Weight: 44.5 kg (98 lb 1.7 oz) IBW/kg (Calculated) : 63.8 Heparin Dosing Weight: 44.5 kg  Vital Signs: Temp: 98.2 F (36.8 C) (01/30 2316) Temp Source: Esophageal (01/30 2316) Pulse Rate: 104 (01/30 2130)  Labs: Recent Labs    04/05/2022 1125 04/09/2022 1233 03/31/2022 1732 03/29/2022 2045 04/11/22 0040 04/11/22 0151 04/11/22 0152 04/11/22 0815 04/11/22 1111 04/11/22 1138 04/11/22 1220 04/11/22 1729 04/11/22 2334  HGB 9.0*   < > 8.9*  --  8.7*  --  8.8*  --   --  8.2*  --   --   --   HCT 28.8*   < > 28.2*  --  26.5*  --  26.0*  --   --  24.0*  --   --   --   PLT 218  --  158  --  154  --   --   --   --   --   --   --   --   LABPROT 15.2  --   --   --   --   --   --   --   --   --   --   --   --   INR 1.2  --   --   --   --   --   --   --   --   --   --   --   --   HEPARINUNFRC  --   --   --   --   --   --   --   --   --   --   --  0.57 0.61  CREATININE 0.99  --  0.99  --  0.81  --   --   --   --   --   --   --   --   TROPONINIHS 35*   < >  --    < >  --    < >  --  752* 928*  --  945*  --   --    < > = values in this interval not displayed.     Estimated Creatinine Clearance: 61.8 mL/min (by C-G formula based on SCr of 0.81 mg/dL).    Assessment: 62 YO male with concern for ACS--troponin 35>>752 since admission on 1/29. Patient has a history of CAD s/p CABG in 2004--on aspirin monotherapy at home. No anticoagulation PTA. Pharmacy consulted for heparin management.   Heparin level therapeutic (0.57) on infusion at 500 units/hr. No bleeding noted.  1/31 AM update:  Heparin level therapeutic x 2  Goal of Therapy:  Heparin level 0.3-0.7 units/ml Monitor platelets by anticoagulation protocol: Yes   Plan:  Cont heparin 500 units/hr Daily CBC and heparin level Monitor for bleeding  Narda Bonds, PharmD,  BCPS Clinical Pharmacist Phone: [phone]

## 2022-04-12 NOTE — Progress Notes (Addendum)
eLink Physician-Brief Progress Note Patient Name: Charles [name] DOB: Feb 15, 1961 MRN: [id number]   Date of Service  04/12/2022  HPI/Events of Note  Family requesting an update at bedside  eICU Interventions  Spoke to bedside team and left, will return tomorrow, no intervention indicated    2132 - requesting flexiseal, pt having loose stools; placed order 0456 - calcium 6.8, corrected wnl, no intervention indicated  Intervention Category Minor Interventions: Communication with other healthcare providers and/or family  Levie Heritage 04/12/2022, 8:34 PM

## 2022-04-12 NOTE — Progress Notes (Signed)
Echocardiogram 2D Echocardiogram has been performed.  Ronny Flurry 04/12/2022, 10:32 AM

## 2022-04-12 NOTE — Progress Notes (Addendum)
NAME:  Charles [name], MRN:  [id number], DOB:  February 25, 1961, LOS: 2 ADMISSION DATE:  2022/04/27, CONSULTATION DATE:  04/27/22 REFERRING MD:  Charles [name] EDP, CHIEF COMPLAINT:  Unresponsiveness    History of Present Illness:  Charles [name] 62 yo male with a PMH of dementia with progressive neurological decline, DM, chronic D-HF, CAD s/p CABG in 2004, chronic urinary retention w/ indwelling foley, with hx of recurrent UTIs, who presented to [facility] from SNF ([facility]) unresponsive. Per note, pt is normally alert, and SNF staff reports he was normal this morning eating breakfast and then was found unresponsive and hypotensive after breakfast. On arrival to ED patient SBP in the 30's, and thus was intubated on arrival. Further chart review shows he was recently hospitalized November 2023 with sepsis and bacterima d/t UTI as well as December 2023-January 2024 for pyelonephritis.   ED workup reveled K+ 3.2, Total Co2 17, Albumin 1.7, Anion gap 17, Trop 35, LA 7.8, RVP negative. UA with leukocytes, protein, and bacteria. Blood cultures pending. CXR right base atelectasis, CT Head with NAICA with an unchanged 4-54mm colloid cyst. CT Chest Abd Pelvis with Right lower lobe consolidation concerning for PNA vs Asp PNA.   Called sister Charles [name] Psychologist, educational) who lives in [place] to discuss patients status. Charles [name] is concerned and explains that she feel that this was a rather sudden change from when she last talk to him on Friday (04/08/2022). She said she is aware of the patients wishes for not wanting any advanced life sustaining measures, but given the acute change, she would like to continue treating him, however, does not want any CPR in the event that his heart would stop. She is aware of the severity of Charles [name]'s current state, and would like to given him the best chance of recovery as possible. Per notes on 03/14/2022, goals outlined where patient would prefer to die a natural death. Pt has been by palliative care back in  08/2021 where his GOC are outlined with NO feeding Tube.   PCCM consulted for further management.   Pertinent  Medical History   Past Medical History:  Diagnosis Date   Arthritis    Chronic diastolic heart failure (HCC)    Dementia (HCC)    Diabetes mellitus without complication ([place])    Heart disease    History of recurrent UTIs    (has chronic indwelling foley catheter)   Hypertension     Significant Hospital Events: Including procedures, antibiotic start and stop dates in addition to other pertinent events   2022-04-27 - Arrived to [facility], unresponsive, hypotensive, intubated, CL placed, started on NE and Vaso. On Meropenem, Flagyl, Vancomycin 04/11/22 Added Epi gtt overnight  Interim History / Subjective:  Patient blood culture is growing ESBL E. coli Still remain on high-dose vasopressor support including epinephrine, norepinephrine and vasopressin Remains severely acidotic with serum bicarbonate of 12  Objective   Blood pressure 108/81, pulse (!) 112, temperature 98.2 F (36.8 C), resp. rate (!) 33, height 5\' 6"  (1.676 m), weight 46.3 kg, SpO2 100 %. CVP:  [8 mmHg] 8 mmHg  Vent Mode: PSV;CPAP FiO2 (%):  [40 %] 40 % Set Rate:  [16 bmp] 16 bmp Vt Set:  [530 mL] 530 mL PEEP:  [5 cmH20] 5 cmH20 Pressure Support:  [5 cmH20] 5 cmH20 Plateau Pressure:  [14 cmH20-21 cmH20] 14 cmH20   Intake/Output Summary (Last 24 hours) at 04/12/2022 0816 Last data filed at 04/12/2022 0700 Gross per 24 hour  Intake 4737.29 ml  Output 1445  ml  Net 3292.29 ml   Filed Weights   04/11/22 0139 04/11/22 1100 04/12/22 0324  Weight: 44.5 kg 44.5 kg 46.3 kg   Physical Examination:   Physical exam: General: Crtitically ill-appearing male, orally intubated HEENT: [place]/AT, eyes anicteric.  ETT and OGT in place Neuro: Eyes open, not following commands, moving all 4 extremities Chest: Tachypneic coarse breath sounds, no wheezes or rhonchi Heart: Tachycardic regular rhythm, no murmurs or  gallops Abdomen: Soft, nondistended, bowel sounds present Skin: No rash  .  Procal >150 WBC 60.1 Hgb/Hct 7.7/22.1 Platelet 70 Na 132 Co2 12 Trop 945 BUN/creatinine 20/0.9  Resolved Hospital Problem list   Hypokalemia  Assessment & Plan:  Septic shock due to ESBL E. coli bacteremia Recurrent urinary tract infection, now with ESBL E. Coli Leukemoid reaction Patient continued to require multiple vasopressor support Blood culture grew ESBL E. coli Antibiotics were switched to meropenem Continue titrate vasopressor support with map goal 65 Trend lactate Follow-up sensitivity results Continue stress dose steroids with hydrocortisone White count went up to 60 K Repeat in the afternoon  Acute hypoxic respiratory Failure  Asp PNA Continue lung protective ventilation VAP prevention bundle in place Patient is on pressure support trial but he is tachypneic He is severely acidotic and on multiple vasopressor support, I do not think patient is ready to come off of mechanical ventilator Continue antibiotics Follow-up respiratory culture Continue PAD protocol with Precedex and as needed fentanyl with RASS goal -1> likely in the   High anion gap metabolic acidosis Even though lactate is trending down, serum bicarbonate is 12 Started on bicarbonate infusion Monitor chemistry  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 Continue Foley catheter  CAD s/p CABG Demand cardiac ischemia IV heparin infusion Hold Zetia and statin for now  DM type II Blood sugar well-controlled Continue insulin with CBG goal 140-180  Hyponatremia Closely monitor serum sodium  Anemia of chronic disease Thrombocytopenia of critical illness Closely monitor H&H and transfuse if less than 7 Platelet counts are dropping  Severe malnutrition Dietitian is following Holding tube feeding for now considering still requiring high-dose vasopressor support  Best Practice (right click and  "Reselect all SmartList Selections" daily)   Diet/type: NPO DVT prophylaxis: Subcu Lovenox GI prophylaxis: H2B Lines: Central line Foley:  Yes, and it is still needed Code Status:  DNR Last date of multidisciplinary goals of care discussion [04/11/2022 with [name]]  Labs   CBC: Recent Labs  Lab 04/05/22 1356 03/23/2022 1116 03/24/2022 1125 03/26/2022 1233 03/28/2022 1732 04/11/22 0040 04/11/22 0152 04/11/22 1138 04/12/22 0449  WBC 11.1*  --  6.6  --  37.0* 36.3*  --   --  60.1*  NEUTROABS 8.2*  --  6.2  --   --   --   --   --   --   HGB 9.5*   < > 9.0*   < > 8.9* 8.7* 8.8* 8.2* 7.7*  HCT 29.3*   < > 28.8*   < > 28.2* 26.5* 26.0* 24.0* 22.1*  MCV 81.6  --  86.0  --  85.7 82.0  --   --  78.9*  PLT 292  --  218  --  158 154  --   --  70*   < > = values in this interval not displayed.    Basic Metabolic Panel: Recent Labs  Lab 04/05/22 1356 03/27/2022 1116 03/13/2022 1125 03/22/2022 1233 04/03/2022 1732 04/11/22 0040 04/11/22 0152 04/11/22 1138 04/12/22 0449  NA 138 139  138  137 136  --  131* 133* 133* 132*  K 3.1* 3.2*  3.2* 3.2* 3.2*  --  4.0 4.2 4.1 4.0  CL 104 104 103  --   --  103  --   --  107  CO2 28  --  17*  --   --  18*  --   --  12*  GLUCOSE 136* 86 86  --   --  207*  --   --  148*  BUN 11 11 12   --   --  14  --   --  20  CREATININE 0.57* 0.70 0.99  --  0.99 0.81  --   --  0.98  CALCIUM 8.2*  --  8.1*  --   --  7.4*  --   --  7.1*  MG  --   --  1.7  --   --  1.8  --   --  1.9  PHOS  --   --   --   --   --  2.6  --   --  3.9   GFR: Estimated Creatinine Clearance: 53.2 mL/min (by C-G formula based on SCr of 0.98 mg/dL). Recent Labs  Lab 03/27/2022 1125 04/05/2022 1128 04/08/2022 1440 03/29/2022 1732 04/11/22 0040 04/11/22 1031 04/11/22 1220 04/11/22 1439 04/11/22 1729 04/12/22 0449  PROCALCITON  --   --  6.17  --  118.47  --   --   --   --  >150.00  WBC 6.6  --   --  37.0* 36.3*  --   --   --   --  60.1*  LATICACIDVEN  --    < >  --   --   --  2.7* 2.0* 2.2* 2.2*   --    < > = values in this interval not displayed.    Liver Function Tests: Recent Labs  Lab 04/05/22 1356 03/28/2022 1125 04/11/22 0151  AST 76* 41  --   ALT 26 13  --   ALKPHOS 95 159*  --   BILITOT 0.2* 0.6  --   PROT 6.7 5.5*  --   ALBUMIN 2.2* 1.7* <1.5*   No results for input(s): "LIPASE", "AMYLASE" in the last 168 hours. No results for input(s): "AMMONIA" in the last 168 hours.  ABG    Component Value Date/Time   PHART 7.442 04/11/2022 1138   PCO2ART 23.7 (L) 04/11/2022 1138   PO2ART 175 (H) 04/11/2022 1138   HCO3 16.1 (L) 04/11/2022 1138   TCO2 17 (L) 04/11/2022 1138   ACIDBASEDEF 7.0 (H) 04/11/2022 1138   O2SAT 100 04/11/2022 1138     Coagulation Profile: Recent Labs  Lab 03/31/2022 1125  INR 1.2    Cardiac Enzymes: No results for input(s): "CKTOTAL", "CKMB", "CKMBINDEX", "TROPONINI" in the last 168 hours.  HbA1C: Hgb A1c MFr Bld  Date/Time Value Ref Range Status  01/27/2022 05:32 AM 7.7 (H) 4.8 - 5.6 % Final    Comment:    (NOTE) Pre diabetes:          5.7%-6.4%  Diabetes:              >6.4%  Glycemic control for   <7.0% adults with diabetes   07/03/2021 01:52 AM 10.4 (H) 4.8 - 5.6 % Final    Comment:    (NOTE) Pre diabetes:          5.7%-6.4%  Diabetes:              >6.4%  Glycemic control for   <7.0% adults with diabetes     CBG: Recent Labs  Lab 04/11/22 1512 04/11/22 1925 04/11/22 2318 04/12/22 0319 04/12/22 0737  GLUCAP 120* 75 87 125* 109*   This patient is critically ill with multiple organ system failure which requires frequent high complexity decision making, assessment, support, evaluation, and titration of therapies. This was completed through the application of advanced monitoring technologies and extensive interpretation of multiple databases.  During this encounter critical care time was devoted to patient care services described in this note for 44 minutes.    Jacky Kindle, MD  Pulmonary Critical Care See  Amion for pager If no response to pager, please call [phone] until 7pm After 7pm, Please call E-link [phone]

## 2022-04-12 NOTE — Progress Notes (Signed)
Manufacturing engineer [facility]) Hospital Liaison Note  This is a pending [facility] hospital referral. We will follow discharge disposition. Please call with any questions or concerns. Thank you  Roselee Nova, [place] [facility] Liaison [phone]

## 2022-04-13 DIAGNOSIS — N12 Tubulo-interstitial nephritis, not specified as acute or chronic: Secondary | ICD-10-CM | POA: Diagnosis not present

## 2022-04-13 DIAGNOSIS — J189 Pneumonia, unspecified organism: Secondary | ICD-10-CM | POA: Diagnosis not present

## 2022-04-13 DIAGNOSIS — A419 Sepsis, unspecified organism: Secondary | ICD-10-CM | POA: Diagnosis not present

## 2022-04-13 DIAGNOSIS — R6521 Severe sepsis with septic shock: Secondary | ICD-10-CM | POA: Diagnosis not present

## 2022-04-13 LAB — GLUCOSE, CAPILLARY
Glucose-Capillary: 157 mg/dL — ABNORMAL HIGH (ref 70–99)
Glucose-Capillary: 50 mg/dL — ABNORMAL LOW (ref 70–99)
Glucose-Capillary: 77 mg/dL (ref 70–99)
Glucose-Capillary: 137 mg/dL — ABNORMAL HIGH (ref 70–99)
Glucose-Capillary: 117 mg/dL — ABNORMAL HIGH (ref 70–99)
Glucose-Capillary: 147 mg/dL — ABNORMAL HIGH (ref 70–99)
Glucose-Capillary: 148 mg/dL — ABNORMAL HIGH (ref 70–99)
Glucose-Capillary: 97 mg/dL (ref 70–99)
Glucose-Capillary: 163 mg/dL — ABNORMAL HIGH (ref 70–99)

## 2022-04-13 LAB — CBC
HCT: 23.5 % — ABNORMAL LOW (ref 39.0–52.0)
Hemoglobin: 7.8 g/dL — ABNORMAL LOW (ref 13.0–17.0)
MCH: 26.5 pg (ref 26.0–34.0)
MCHC: 33.2 g/dL (ref 30.0–36.0)
MCV: 79.9 fL — ABNORMAL LOW (ref 80.0–100.0)
Platelets: 38 10*3/uL — ABNORMAL LOW (ref 150–400)
RBC: 2.94 MIL/uL — ABNORMAL LOW (ref 4.22–5.81)
RDW: 18.3 % — ABNORMAL HIGH (ref 11.5–15.5)
WBC: 48.6 10*3/uL — ABNORMAL HIGH (ref 4.0–10.5)
nRBC: 0 % (ref 0.0–0.2)

## 2022-04-13 LAB — BASIC METABOLIC PANEL
Anion gap: 10 (ref 5–15)
BUN: 24 mg/dL — ABNORMAL HIGH (ref 6–20)
CO2: 16 mmol/L — ABNORMAL LOW (ref 22–32)
Calcium: 6.8 mg/dL — ABNORMAL LOW (ref 8.9–10.3)
Chloride: 106 mmol/L (ref 98–111)
Creatinine, Ser: 0.83 mg/dL (ref 0.61–1.24)
GFR, Estimated: >60 mL/min (ref >60)
Glucose, Bld: 163 mg/dL — ABNORMAL HIGH (ref 70–99)
Potassium: 3.8 mmol/L (ref 3.5–5.1)
Sodium: 132 mmol/L — ABNORMAL LOW (ref 135–145)

## 2022-04-13 LAB — POCT I-STAT 7, (LYTES, BLD GAS, ICA,H+H)
Acid-Base Excess: 1 mmol/L (ref 0.0–2.0)
Bicarbonate: 22.2 mmol/L (ref 20.0–28.0)
Calcium, Ion: 1.13 mmol/L — ABNORMAL LOW (ref 1.15–1.40)
HCT: 26 % — ABNORMAL LOW (ref 39.0–52.0)
Hemoglobin: 8.8 g/dL — ABNORMAL LOW (ref 13.0–17.0)
O2 Saturation: 100 %
Patient temperature: 97.5
Potassium: 3.8 mmol/L (ref 3.5–5.1)
Sodium: 139 mmol/L (ref 135–145)
TCO2: 23 mmol/L (ref 22–32)
pCO2 arterial: 22.7 mmHg — ABNORMAL LOW (ref 32–48)
pH, Arterial: 7.596 — ABNORMAL HIGH (ref 7.35–7.45)
pO2, Arterial: 178 mmHg — ABNORMAL HIGH (ref 83–108)

## 2022-04-13 LAB — HEPARIN LEVEL (UNFRACTIONATED): Heparin Unfractionated: <0.1 IU/mL — ABNORMAL LOW (ref 0.30–0.70)

## 2022-04-13 LAB — MAGNESIUM
Magnesium: 2.2 mg/dL (ref 1.7–2.4)
Magnesium: 2.1 mg/dL (ref 1.7–2.4)

## 2022-04-13 LAB — PHOSPHORUS
Phosphorus: 3.2 mg/dL (ref 2.5–4.6)
Phosphorus: 2.7 mg/dL (ref 2.5–4.6)

## 2022-04-13 MED ORDER — LACTATED RINGERS IV BOLUS
2000.0000 mL | Freq: Once | INTRAVENOUS | Status: AC
  Administered 2022-04-13: 2000 mL via INTRAVENOUS

## 2022-04-13 MED ORDER — ATROPINE SULFATE 1 MG/10ML IJ SOSY
1.0000 mg | PREFILLED_SYRINGE | Freq: Once | INTRAMUSCULAR | Status: AC
  Administered 2022-04-13: 1 mg via INTRAVENOUS
  Filled 2022-04-13: qty 10

## 2022-04-13 MED ORDER — CALCIUM GLUCONATE 10 % IV SOLN
2.0000 g | Freq: Once | INTRAVENOUS | Status: DC
  Filled 2022-04-13 (×2): qty 20

## 2022-04-13 MED ORDER — CALCIUM GLUCONATE-NACL 2-0.675 GM/100ML-% IV SOLN
2.0000 g | Freq: Once | INTRAVENOUS | Status: AC
  Administered 2022-04-13: 2000 mg via INTRAVENOUS
  Filled 2022-04-13: qty 100

## 2022-04-13 MED ORDER — SODIUM BICARBONATE 8.4 % IV SOLN
INTRAVENOUS | Status: AC
  Administered 2022-04-13: 100 meq
  Filled 2022-04-13: qty 100

## 2022-04-13 MED ORDER — SODIUM BICARBONATE 8.4 % IV SOLN
INTRAVENOUS | Status: AC
  Filled 2022-04-13: qty 100

## 2022-04-13 MED ORDER — AMIODARONE IV BOLUS ONLY 150 MG/100ML
INTRAVENOUS | Status: AC
  Filled 2022-04-13: qty 100

## 2022-04-13 MED ORDER — ALBUMIN HUMAN 25 % IV SOLN
INTRAVENOUS | Status: AC
  Filled 2022-04-13: qty 50

## 2022-04-13 MED ORDER — ALBUMIN HUMAN 25 % IV SOLN
50.0000 g | INTRAVENOUS | Status: AC
  Administered 2022-04-13: 50 g via INTRAVENOUS

## 2022-04-13 MED ORDER — PROSOURCE TF20 ENFIT COMPATIBL EN LIQD
60.0000 mL | Freq: Every day | ENTERAL | Status: DC
  Administered 2022-04-13 – 2022-04-17 (×5): 60 mL
  Filled 2022-04-13 (×5): qty 60

## 2022-04-13 MED ORDER — OSMOLITE 1.5 CAL PO LIQD
1000.0000 mL | ORAL | Status: DC
  Administered 2022-04-13 – 2022-04-16 (×4): 1000 mL
  Filled 2022-04-13 (×2): qty 1000

## 2022-04-13 MED ORDER — LACTATED RINGERS IV BOLUS
2000.0000 mL | Freq: Once | INTRAVENOUS | Status: AC
  Administered 2022-04-13: 1000 mL via INTRAVENOUS

## 2022-04-13 MED ORDER — ALBUMIN HUMAN 25 % IV SOLN
INTRAVENOUS | Status: AC
  Filled 2022-04-13: qty 150

## 2022-04-13 MED ORDER — SODIUM BICARBONATE 8.4 % IV SOLN: 100.0000 meq | Freq: Once | INTRAVENOUS | Status: AC

## 2022-04-13 MED ORDER — SODIUM BICARBONATE 8.4 % IV SOLN
100.0000 meq | Freq: Once | INTRAVENOUS | Status: AC
  Administered 2022-04-13: 100 meq via INTRAVENOUS
  Filled 2022-04-13: qty 50

## 2022-04-13 MED ORDER — ARTIFICIAL TEARS OPHTHALMIC OINT
TOPICAL_OINTMENT | Freq: Two times a day (BID) | OPHTHALMIC | Status: DC
  Administered 2022-04-13 – 2022-04-16 (×3): 1 via OPHTHALMIC
  Filled 2022-04-13: qty 3.5

## 2022-04-13 MED ORDER — SODIUM BICARBONATE 8.4 % IV SOLN
100.0000 meq | Freq: Once | INTRAVENOUS | Status: AC
  Administered 2022-04-13: 100 meq via INTRAVENOUS

## 2022-04-13 MED ORDER — LACTATED RINGERS IV BOLUS: 1000.0000 mL | Freq: Once | INTRAVENOUS | Status: DC

## 2022-04-13 NOTE — Progress Notes (Signed)
[place] [facility]) Hospital Liaison Note   Mr Lafon is a pending referral with [facility] hospice. Visited patient at bedside. Report exchanged with RN.    Please call with any hospice questions or concerns. Thank you.   Zigmund Gottron, RN [facility] Liaison [phone]

## 2022-04-13 NOTE — Progress Notes (Signed)
NAME:  Charles [name], MRN:  [id number], DOB:  1960/12/27, LOS: 3 ADMISSION DATE:  04/19/2022, CONSULTATION DATE:  04-19-2022 REFERRING MD:  Doren Custard EDP, CHIEF COMPLAINT:  Unresponsiveness    History of Present Illness:  Charles [name] 62 yo male with a PMH of dementia with progressive neurological decline, DM, chronic D-HF, CAD s/p CABG in 2004, chronic urinary retention w/ indwelling foley, with hx of recurrent UTIs, who presented to [facility] from SNF ([facility]) unresponsive. Per note, pt is normally alert, and SNF staff reports he was normal this morning eating breakfast and then was found unresponsive and hypotensive after breakfast. On arrival to ED patient SBP in the 30's, and thus was intubated on arrival. Further chart review shows he was recently hospitalized November 2023 with sepsis and bacterima d/t UTI as well as December 2023-January 2024 for pyelonephritis.   ED workup reveled K+ 3.2, Total Co2 17, Albumin 1.7, Anion gap 17, Trop 35, LA 7.8, RVP negative. UA with leukocytes, protein, and bacteria. Blood cultures pending. CXR right base atelectasis, CT Head with NAICA with an unchanged 4-66mm colloid cyst. CT Chest Abd Pelvis with Right lower lobe consolidation concerning for PNA vs Asp PNA.   Called sister Charles [name] Psychologist, educational) who lives in [place] to discuss patients status. Charles [name] is concerned and explains that she feel that this was a rather sudden change from when she last talk to him on Friday (04/08/2022). She said she is aware of the patients wishes for not wanting any advanced life sustaining measures, but given the acute change, she would like to continue treating him, however, does not want any CPR in the event that his heart would stop. She is aware of the severity of Charles [name]'s current state, and would like to given him the best chance of recovery as possible. Per notes on 03/14/2022, goals outlined where patient would prefer to die a natural death. Pt has been by palliative care back in  08/2021 where his GOC are outlined with NO feeding Tube.   PCCM consulted for further management.   Pertinent  Medical History   Past Medical History:  Diagnosis Date   Arthritis    Chronic diastolic heart failure (HCC)    Dementia (HCC)    Diabetes mellitus without complication ([place])    Heart disease    History of recurrent UTIs    (has chronic indwelling foley catheter)   Hypertension     Significant Hospital Events: Including procedures, antibiotic start and stop dates in addition to other pertinent events   April 19, 2022 - Arrived to [facility], unresponsive, hypotensive, intubated, CL placed, started on NE and Vaso. On Meropenem, Flagyl, Vancomycin 04/11/22 Added Epi gtt overnight  Interim History / Subjective:  Patient remains on vasopressin and Levophed Failed supportive breathing trial due to tachypnea this morning Remained afebrile  Objective   Blood pressure 108/81, pulse (!) 52, temperature (!) 95.2 F (35.1 C), resp. rate 16, height 5\' 6"  (1.676 m), weight 50.5 kg, SpO2 100 %.    Vent Mode: PRVC FiO2 (%):  [40 %] 40 % Set Rate:  [16 bmp] 16 bmp Vt Set:  [530 mL] 530 mL PEEP:  [5 cmH20] 5 cmH20 Plateau Pressure:  [15 cmH20-18 cmH20] 15 cmH20   Intake/Output Summary (Last 24 hours) at 04/13/2022 1152 Last data filed at 04/13/2022 1100 Gross per 24 hour  Intake 4381.62 ml  Output 975 ml  Net 3406.62 ml   Filed Weights   04/11/22 1100 04/12/22 0324 04/13/22 0500  Weight: 44.5 kg  46.3 kg 50.5 kg   Physical Examination:   Physical exam: General: Crtitically ill-appearing male, orally intubated HEENT: [place]/AT, eyes anicteric.  ETT and OGT in place Neuro: Sedated, not following commands.  Eyes are closed.  Pupils 3 mm bilateral reactive to light Chest: Coarse breath sounds, no wheezes or rhonchi Heart: Regular rate and rhythm, no murmurs or gallops Abdomen: Soft, nondistended, bowel sounds present Skin: No rash.  Procal >150 WBC 48.6 Hgb/Hct 7.8/23.5 Platelet 38 Na  132 Co2 16 Trop 945 BUN/creatinine 20/0.8  Resolved Hospital Problem list   Hypokalemia  Assessment & Plan:  Septic shock due to acute UTI, related to indwelling Foley catheter  ESBL E. coli bacteremia Leukemoid reaction Patient continued to require multiple vasopressor support Though requirement of vasopressors is slowly coming down Currently on Levophed and vasopressin Epinephrine was titrated off Blood culture grew ESBL E. coli, sensitive results are pending Continue IV meropenem Continue titrate vasopressor support with map goal 65 Continue stress dose steroids with hydrocortisone White count trended down to 48 K   Acute hypoxic respiratory Failure  Asp PNA Continue lung protective ventilation VAP prevention bundle in place Patient failed spontaneous breathing trial due to tachypnea Will try again later Continue antibiotics Follow-up respiratory culture Continue PAD protocol with fentanyl with RASS goal -1  High anion gap metabolic acidosis Even though lactate is trending down, serum bicarbonate is 16 Will give 2 and a bicarbonate Monitor chemistry  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 Continue Foley catheter  CAD s/p CABG Demand cardiac ischemia IV heparin infusion was stopped Hold Zetia and statin for now  DM type II Blood sugar well-controlled Continue insulin with CBG goal 140-180  Hyponatremia Closely monitor serum sodium Serum sodium remained at 132  Anemia of chronic disease Thrombocytopenia of critical illness Closely monitor H&H and transfuse if less than 7 Platelet counts are dropping, currently at 38  Severe malnutrition Dietitian is following Starting trickle tube feeds today, advance as tolerated  Best Practice (right click and "Reselect all SmartList Selections" daily)   Diet/type: NPO tube needs DVT prophylaxis: Subcu Lovenox GI prophylaxis: H2B Lines: Central line Foley:  Yes, and it is still needed Code  Status:  DNR Last date of multidisciplinary goals of care discussion [04/13/2022 with [name]]  Labs   CBC: Recent Labs  Lab 03/29/2022 1125 04/09/2022 1233 04/04/2022 1732 04/11/22 0040 04/11/22 0152 04/11/22 1138 04/12/22 0449 04/12/22 0827 04/12/22 1134 04/13/22 0154  WBC 6.6  --  37.0* 36.3*  --   --  60.1*  --   --  48.6*  NEUTROABS 6.2  --   --   --   --   --   --   --   --   --   HGB 9.0*   < > 8.9* 8.7*   < > 8.2* 7.7* 6.5* 7.1* 7.8*  HCT 28.8*   < > 28.2* 26.5*   < > 24.0* 22.1* 19.0* 21.0* 23.5*  MCV 86.0  --  85.7 82.0  --   --  78.9*  --   --  79.9*  PLT 218  --  158 154  --   --  70*  --   --  38*   < > = values in this interval not displayed.    Basic Metabolic Panel: Recent Labs  Lab 03/30/2022 1125 03/29/2022 1233 03/13/2022 1732 04/11/22 0040 04/11/22 0152 04/12/22 0449 04/12/22 0827 04/12/22 0835 04/12/22 1134 04/13/22 0154  NA 137   < >  --  131*   < > 132* 144 137 136 132*  K 3.2*   < >  --  4.0   < > 4.0 3.9 3.8 3.8 3.8  CL 103  --   --  103  --  107  --  104  --  106  CO2 17*  --   --  18*  --  12*  --  21*  --  16*  GLUCOSE 86  --   --  207*  --  148*  --  145*  --  163*  BUN 12  --   --  14  --  20  --  20  --  24*  CREATININE 0.99  --  0.99 0.81  --  0.98  --  0.95  --  0.83  CALCIUM 8.1*  --   --  7.4*  --  7.1*  --  6.7*  --  6.8*  MG 1.7  --   --  1.8  --  1.9  --   --   --  2.2  PHOS  --   --   --  2.6  --  3.9  --   --   --  3.2   < > = values in this interval not displayed.   GFR: Estimated Creatinine Clearance: 68.4 mL/min (by C-G formula based on SCr of 0.83 mg/dL). Recent Labs  Lab 04/07/2022 1440 03/24/2022 1732 04/11/22 0040 04/11/22 1031 04/11/22 1220 04/11/22 1439 04/11/22 1729 04/12/22 0449 04/13/22 0154  PROCALCITON 6.17  --  118.47  --   --   --   --  >150.00  --   WBC  --  37.0* 36.3*  --   --   --   --  60.1* 48.6*  LATICACIDVEN  --   --   --  2.7* 2.0* 2.2* 2.2*  --   --     Liver Function Tests: Recent Labs  Lab  03/18/2022 1125 04/11/22 0151  AST 41  --   ALT 13  --   ALKPHOS 159*  --   BILITOT 0.6  --   PROT 5.5*  --   ALBUMIN 1.7* <1.5*   No results for input(s): "LIPASE", "AMYLASE" in the last 168 hours. No results for input(s): "AMMONIA" in the last 168 hours.  ABG    Component Value Date/Time   PHART 7.455 (H) 04/12/2022 1134   PCO2ART 24.8 (L) 04/12/2022 1134   PO2ART 188 (H) 04/12/2022 1134   HCO3 17.5 (L) 04/12/2022 1134   TCO2 18 (L) 04/12/2022 1134   ACIDBASEDEF 6.0 (H) 04/12/2022 1134   O2SAT 100 04/12/2022 1134     Coagulation Profile: Recent Labs  Lab 03/31/2022 1125  INR 1.2    Cardiac Enzymes: No results for input(s): "CKTOTAL", "CKMB", "CKMBINDEX", "TROPONINI" in the last 168 hours.  HbA1C: Hgb A1c MFr Bld  Date/Time Value Ref Range Status  01/27/2022 05:32 AM 7.7 (H) 4.8 - 5.6 % Final    Comment:    (NOTE) Pre diabetes:          5.7%-6.4%  Diabetes:              >6.4%  Glycemic control for   <7.0% adults with diabetes   07/03/2021 01:52 AM 10.4 (H) 4.8 - 5.6 % Final    Comment:    (NOTE) Pre diabetes:          5.7%-6.4%  Diabetes:              >  6.4%  Glycemic control for   <7.0% adults with diabetes     CBG: Recent Labs  Lab 04/13/22 0317 04/13/22 0324 04/13/22 0723 04/13/22 0800 04/13/22 1128  GLUCAP 50* 157* 77 137* 117*   This patient is critically ill with multiple organ system failure which requires frequent high complexity decision making, assessment, support, evaluation, and titration of therapies. This was completed through the application of advanced monitoring technologies and extensive interpretation of multiple databases.  During this encounter critical care time was devoted to patient care services described in this note for 39 minutes.    Jacky Kindle, MD [facility] See Amion for pager If no response to pager, please call [phone] until 7pm After 7pm, Please call E-link [phone]

## 2022-04-13 NOTE — Progress Notes (Signed)
Nutrition Follow-up  DOCUMENTATION CODES:   Underweight, Severe malnutrition in context of chronic illness  INTERVENTION:  Initiate tube feeds via OG tube: - Start Osmolite 1.5 @ 20 ml/hr and advance by 10 ml q 8 hours to goal rate of 50 ml/hr (1200 ml/day) - PROSource TF20 60 ml daily  Tube feeding regimen at goal rate provides 1880 kcal, 95 grams of protein, and 914 ml of H2O.   Monitor magnesium, potassium, and phosphorus q 12 hours for at least 6 occurrences days, MD to replete as needed, as pt is at risk for refeeding syndrome given severe malnutrition.  - Continue thiamine 100 mg daily per tube x 7 days due to refeeding risk  - Continue MVI with minerals daily per tube  NUTRITION DIAGNOSIS:   Severe Malnutrition related to chronic illness as evidenced by severe fat depletion, severe muscle depletion.  Ongoing, being addressed via initiation of TF  GOAL:   Patient will meet greater than or equal to 90% of their needs  Met via TF at goal  MONITOR:   Vent status, Labs, Weight trends, TF tolerance, Skin, I & O's  REASON FOR ASSESSMENT:   Consult Enteral/tube feeding initiation and management  ASSESSMENT:   Pt with hx of dementia, DM type 2, HTN, and CHF presented to ED from SNF unresponsive. Pt found to be in septic shock and intubated.  Discussed pt with RN and during ICU rounds. Pt remains in septic shock due to ESBL E. coli bacteremia. Pt remains on pressors but rates decreased. Consult received for enteral nutrition initiation and management. Spoke with CCM MD. Plan to start tube feeds at trickle rate and slowly advance to goal. OG tube in stomach per x-ray.  Weight up 6 kg compared to admission weight. Pt with mild pitting edema to LUE and RLE.  Admit weight: 44.5 kg Current weight: 50.5 kg  Patient remains intubated on ventilator support MV: 11.2 L/min Temp (24hrs), Avg:97.5 F (36.4 C), Min:95.2 F (35.1 C), Max:99 F (37.2 C) BP (a-line):  113/65 MAP (a-line): 84  Drips: Precedex Levophed: 17 mcg/min Vasopressin: 0.03 units/min  Medications reviewed and include: colace, pepcid, IV solu-cortef, SSI q 4 hours, MVI with minerals daily, thiamine 100 mg daily, IV calcium gluconate 2 grams x 1, IV abx  Labs reviewed: sodium 132, BUN 24, WBC 48.6, hemoglobin 7.8, platelets 38 CBG's: 50-157 x 24 hours  UOP: 675 ml x 24 hours OGT: 300 ml x 24 hours I/O's: +11.3 L since admit  Diet Order:   Diet Order             Diet NPO time specified  Diet effective now                   EDUCATION NEEDS:   Not appropriate for education at this time  Skin:  Skin Assessment: Skin Integrity Issues: Stage II: sacrum  Last BM:  04/12/22 rectal tube  Height:   Ht Readings from Last 1 Encounters:  04/11/22 5\' 6"  (1.610 m)    Weight:   Wt Readings from Last 1 Encounters:  04/13/22 50.5 kg    Ideal Body Weight:  64.5 kg  BMI:  Body mass index is 17.97 kg/m.  Estimated Nutritional Needs:   Kcal:  1600-1800 kcal/d  Protein:  80-100g/d  Fluid:  1.6-1.8L/d    Gustavus Bryant, MS, RD, LDN Inpatient Clinical Dietitian Please see AMiON for contact information.

## 2022-04-13 DEATH — deceased

## 2022-04-14 DIAGNOSIS — A419 Sepsis, unspecified organism: Secondary | ICD-10-CM | POA: Diagnosis not present

## 2022-04-14 DIAGNOSIS — R6521 Severe sepsis with septic shock: Secondary | ICD-10-CM | POA: Diagnosis not present

## 2022-04-14 LAB — CBC WITH DIFFERENTIAL/PLATELET
Abs Immature Granulocytes: 1.53 10*3/uL — ABNORMAL HIGH (ref 0.00–0.07)
Basophils Absolute: 0.1 10*3/uL (ref 0.0–0.1)
Basophils Relative: 0 %
Eosinophils Absolute: 0.1 10*3/uL (ref 0.0–0.5)
Eosinophils Relative: 0 %
HCT: 22.3 % — ABNORMAL LOW (ref 39.0–52.0)
Hemoglobin: 7.5 g/dL — ABNORMAL LOW (ref 13.0–17.0)
Immature Granulocytes: 6 %
Lymphocytes Relative: 2 %
Lymphs Abs: 0.5 10*3/uL — ABNORMAL LOW (ref 0.7–4.0)
MCH: 26.4 pg (ref 26.0–34.0)
MCHC: 33.6 g/dL (ref 30.0–36.0)
MCV: 78.5 fL — ABNORMAL LOW (ref 80.0–100.0)
Monocytes Absolute: 0.7 10*3/uL (ref 0.1–1.0)
Monocytes Relative: 3 %
Neutro Abs: 24.7 10*3/uL — ABNORMAL HIGH (ref 1.7–7.7)
Neutrophils Relative %: 89 %
Platelets: 23 10*3/uL — CL (ref 150–400)
RBC: 2.84 MIL/uL — ABNORMAL LOW (ref 4.22–5.81)
RDW: 18 % — ABNORMAL HIGH (ref 11.5–15.5)
Smear Review: DECREASED
WBC: 27.5 10*3/uL — ABNORMAL HIGH (ref 4.0–10.5)
nRBC: 0.1 % (ref 0.0–0.2)

## 2022-04-14 LAB — HEPATIC FUNCTION PANEL
ALT: 44 U/L (ref 0–44)
AST: 157 U/L — ABNORMAL HIGH (ref 15–41)
Albumin: 1.7 g/dL — ABNORMAL LOW (ref 3.5–5.0)
Alkaline Phosphatase: 246 U/L — ABNORMAL HIGH (ref 38–126)
Bilirubin, Direct: 0.6 mg/dL — ABNORMAL HIGH (ref 0.0–0.2)
Indirect Bilirubin: 0.5 mg/dL (ref 0.3–0.9)
Total Bilirubin: 1.1 mg/dL (ref 0.3–1.2)
Total Protein: 4 g/dL — ABNORMAL LOW (ref 6.5–8.1)

## 2022-04-14 LAB — APTT: aPTT: 41 seconds — ABNORMAL HIGH (ref 24–36)

## 2022-04-14 LAB — CBC
HCT: 21.4 % — ABNORMAL LOW (ref 39.0–52.0)
Hemoglobin: 7 g/dL — ABNORMAL LOW (ref 13.0–17.0)
MCH: 25.9 pg — ABNORMAL LOW (ref 26.0–34.0)
MCHC: 32.7 g/dL (ref 30.0–36.0)
MCV: 79.3 fL — ABNORMAL LOW (ref 80.0–100.0)
Platelets: 23 10*3/uL — CL (ref 150–400)
RBC: 2.7 MIL/uL — ABNORMAL LOW (ref 4.22–5.81)
RDW: 17.7 % — ABNORMAL HIGH (ref 11.5–15.5)
WBC: 28 10*3/uL — ABNORMAL HIGH (ref 4.0–10.5)
nRBC: 0.1 % (ref 0.0–0.2)

## 2022-04-14 LAB — BASIC METABOLIC PANEL
Anion gap: 11 (ref 5–15)
BUN: 29 mg/dL — ABNORMAL HIGH (ref 6–20)
CO2: 25 mmol/L (ref 22–32)
Calcium: 7.8 mg/dL — ABNORMAL LOW (ref 8.9–10.3)
Chloride: 104 mmol/L (ref 98–111)
Creatinine, Ser: 0.72 mg/dL (ref 0.61–1.24)
GFR, Estimated: >60 mL/min (ref >60)
Glucose, Bld: 212 mg/dL — ABNORMAL HIGH (ref 70–99)
Potassium: 2.8 mmol/L — ABNORMAL LOW (ref 3.5–5.1)
Sodium: 140 mmol/L (ref 135–145)

## 2022-04-14 LAB — GLUCOSE, CAPILLARY
Glucose-Capillary: 215 mg/dL — ABNORMAL HIGH (ref 70–99)
Glucose-Capillary: 268 mg/dL — ABNORMAL HIGH (ref 70–99)
Glucose-Capillary: 355 mg/dL — ABNORMAL HIGH (ref 70–99)
Glucose-Capillary: 171 mg/dL — ABNORMAL HIGH (ref 70–99)
Glucose-Capillary: 117 mg/dL — ABNORMAL HIGH (ref 70–99)
Glucose-Capillary: 134 mg/dL — ABNORMAL HIGH (ref 70–99)

## 2022-04-14 LAB — PROTIME-INR
INR: 1 (ref 0.8–1.2)
Prothrombin Time: 13.1 seconds (ref 11.4–15.2)

## 2022-04-14 LAB — PREPARE RBC (CROSSMATCH)

## 2022-04-14 LAB — HEMOGLOBIN AND HEMATOCRIT, BLOOD
HCT: 28.9 % — ABNORMAL LOW (ref 39.0–52.0)
Hemoglobin: 10 g/dL — ABNORMAL LOW (ref 13.0–17.0)

## 2022-04-14 LAB — TECHNOLOGIST SMEAR REVIEW: Plt Morphology: DECREASED

## 2022-04-14 LAB — PHOSPHORUS
Phosphorus: 1.9 mg/dL — ABNORMAL LOW (ref 2.5–4.6)
Phosphorus: 2.7 mg/dL (ref 2.5–4.6)

## 2022-04-14 LAB — MAGNESIUM
Magnesium: 2 mg/dL (ref 1.7–2.4)
Magnesium: 1.9 mg/dL (ref 1.7–2.4)

## 2022-04-14 MED ORDER — DOCUSATE SODIUM 50 MG/5ML PO LIQD: 100.0000 mg | Freq: Two times a day (BID) | ORAL | Status: DC | PRN

## 2022-04-14 MED ORDER — INSULIN ASPART 100 UNIT/ML IJ SOLN
0.0000 [IU] | INTRAMUSCULAR | Status: DC
  Administered 2022-04-14: 4 [IU] via SUBCUTANEOUS
  Administered 2022-04-14: 3 [IU] via SUBCUTANEOUS
  Administered 2022-04-14: 20 [IU] via SUBCUTANEOUS
  Administered 2022-04-15 (×3): 4 [IU] via SUBCUTANEOUS
  Administered 2022-04-15: 7 [IU] via SUBCUTANEOUS
  Administered 2022-04-15 (×2): 4 [IU] via SUBCUTANEOUS
  Administered 2022-04-16 (×3): 3 [IU] via SUBCUTANEOUS
  Administered 2022-04-16: 4 [IU] via SUBCUTANEOUS
  Administered 2022-04-16: 7 [IU] via SUBCUTANEOUS
  Administered 2022-04-16: 11 [IU] via SUBCUTANEOUS
  Administered 2022-04-17: 4 [IU] via SUBCUTANEOUS

## 2022-04-14 MED ORDER — MIDODRINE HCL 5 MG PO TABS
10.0000 mg | ORAL_TABLET | Freq: Three times a day (TID) | ORAL | Status: DC
  Administered 2022-04-14 – 2022-04-17 (×9): 10 mg
  Filled 2022-04-14 (×9): qty 2

## 2022-04-14 MED ORDER — MIDAZOLAM HCL 2 MG/2ML IJ SOLN
1.0000 mg | INTRAMUSCULAR | Status: DC | PRN
  Administered 2022-04-14 (×2): 1 mg via INTRAVENOUS
  Administered 2022-04-15 – 2022-04-17 (×2): 2 mg via INTRAVENOUS
  Filled 2022-04-14 (×4): qty 2

## 2022-04-14 MED ORDER — POTASSIUM CHLORIDE 20 MEQ PO PACK
40.0000 meq | PACK | Freq: Once | ORAL | Status: AC
  Administered 2022-04-14: 40 meq
  Filled 2022-04-14: qty 2

## 2022-04-14 MED ORDER — POTASSIUM PHOSPHATES 15 MMOLE/5ML IV SOLN
30.0000 mmol | Freq: Once | INTRAVENOUS | Status: AC
  Administered 2022-04-14: 30 mmol via INTRAVENOUS
  Filled 2022-04-14: qty 10

## 2022-04-14 MED ORDER — SODIUM CHLORIDE 0.9 % IV SOLN
2.0000 g | Freq: Three times a day (TID) | INTRAVENOUS | Status: DC
  Administered 2022-04-14 – 2022-04-17 (×9): 2 g via INTRAVENOUS
  Filled 2022-04-14 (×12): qty 40

## 2022-04-14 MED ORDER — SODIUM CHLORIDE 0.9% IV SOLUTION: Freq: Once | INTRAVENOUS | Status: AC

## 2022-04-14 MED ORDER — POLYETHYLENE GLYCOL 3350 17 G PO PACK: 17.0000 g | PACK | Freq: Every day | ORAL | Status: DC | PRN

## 2022-04-14 MED ORDER — INSULIN GLARGINE-YFGN 100 UNIT/ML ~~LOC~~ SOLN
20.0000 [IU] | Freq: Every day | SUBCUTANEOUS | Status: DC
  Administered 2022-04-14 – 2022-04-15 (×2): 20 [IU] via SUBCUTANEOUS
  Filled 2022-04-14 (×3): qty 0.2

## 2022-04-14 MED ORDER — INSULIN ASPART 100 UNIT/ML IJ SOLN
3.0000 [IU] | INTRAMUSCULAR | Status: DC
  Administered 2022-04-14 – 2022-04-17 (×18): 3 [IU] via SUBCUTANEOUS

## 2022-04-14 MED ORDER — INSULIN GLARGINE-YFGN 100 UNIT/ML ~~LOC~~ SOLN
20.0000 [IU] | Freq: Every day | SUBCUTANEOUS | Status: DC
  Filled 2022-04-14: qty 0.2

## 2022-04-14 NOTE — Progress Notes (Signed)
Manufacturing engineer [facility])  Plan remains for [facility] to continue the follow peripherally through hospitalization as goal is for patient to be admitted to hospice services at DC.    Patient is not an active patient with [place] services. [facility] will continue to follow for any discharge planning needs.    Please call with any questions/concerns.    Thank you for the opportunity to participate in this patient's care.   Phillis Haggis, MSW [facility] Liaison  [phone]

## 2022-04-14 NOTE — Inpatient Diabetes Management (Signed)
Inpatient Diabetes Program Recommendations  AACE/ADA: New Consensus Statement on Inpatient Glycemic Control (2015)  Target Ranges:  Prepandial:   less than 140 mg/dL      Peak postprandial:   less than 180 mg/dL (1-2 hours)      Critically ill patients:  140 - 180 mg/dL   Lab Results  Component Value Date   GLUCAP 268 (H) 04/14/2022   HGBA1C 7.7 (H) 01/27/2022    Latest Reference Range & Units 04/13/22 19:45 04/13/22 23:20 04/14/22 03:37 04/14/22 07:59  Glucose-Capillary 70 - 99 mg/dL 148 (H) 163 (H) 215 (H) 268 (H)  (H): Data is abnormally high  Inpatient Diabetes Program Recommendations:   Please consider -Add Novolog 2-3 units q 4 hrs. Tube feed coverage and adjust as needed (hold if tube feed held or stopped for any reason)  Thank you, Bethena Roys E. Shunna Mikaelian, RN, MSN, CDE  Diabetes Coordinator Inpatient Glycemic Control Team Team Pager [phone] (8am-5pm) 04/14/2022 11:50 AM

## 2022-04-14 NOTE — Progress Notes (Signed)
[facility] ADULT ICU REPLACEMENT PROTOCOL   The patient does apply for the [facility] Adult ICU Electrolyte Replacment Protocol based on the criteria listed below:   1.Exclusion criteria: TCTS, ECMO, Dialysis, and Myasthenia Gravis patients 2. Is GFR >/= 30 ml/min? Yes.    Patient's GFR today is >60 3. Is SCr </= 2? Yes.   Patient's SCr is 0.72 mg/dL 4. Did SCr increase >/= 0.5 in 24 hours? No. 5.Pt's weight >40kg  Yes.   6. Abnormal electrolyte(s): potassium 2.8, phos 1.9  7. Electrolytes replaced per protocol 8.  Call MD STAT for K+ </= 2.5, Phos </= 1, or Mag </= 1 Physician:  n/a  Darlys Gales 04/14/2022 5:01 AM

## 2022-04-14 NOTE — Progress Notes (Signed)
NAME:  Charles [name], MRN:  [id number], DOB:  26-Jun-1960, LOS: 4 ADMISSION DATE:  04/12/2022, CONSULTATION DATE:  03/27/2022 REFERRING MD:  Durwin Nora EDP, CHIEF COMPLAINT:  Unresponsiveness    History of Present Illness:  Charles [name] 62 yo male with a PMH of dementia with progressive neurological decline, DM, chronic D-HF, CAD s/p CABG in 2004, chronic urinary retention w/ indwelling foley, with hx of recurrent UTIs, who presented to [facility] from SNF ([facility]) unresponsive. Per note, pt is normally alert, and SNF staff reports he was normal this morning eating breakfast and then was found unresponsive and hypotensive after breakfast. On arrival to ED patient SBP in the 30's, and thus was intubated on arrival. Further chart review shows he was recently hospitalized November 2023 with sepsis and bacterima d/t UTI as well as December 2023-January 2024 for pyelonephritis.   ED workup reveled K+ 3.2, Total Co2 17, Albumin 1.7, Anion gap 17, Trop 35, LA 7.8, RVP negative. UA with leukocytes, protein, and bacteria. Blood cultures pending. CXR right base atelectasis, CT Head with NAICA with an unchanged 4-69mm colloid cyst. CT Chest Abd Pelvis with Right lower lobe consolidation concerning for PNA vs Asp PNA.   Called sister Maxine Glenn Engineer, technical sales) who lives in [place] to discuss patients status. Maxine Glenn is concerned and explains that she feel that this was a rather sudden change from when she last talk to him on Friday (04/08/2022). She said she is aware of the patients wishes for not wanting any advanced life sustaining measures, but given the acute change, she would like to continue treating him, however, does not want any CPR in the event that his heart would stop. She is aware of the severity of Charles [name]'s current state, and would like to given him the best chance of recovery as possible. Per notes on 03/14/2022, goals outlined where patient would prefer to die a natural death. Pt has been by palliative care back in  08/2021 where his GOC are outlined with NO feeding Tube.   PCCM consulted for further management.   Pertinent  Medical History   Past Medical History:  Diagnosis Date   Arthritis    Chronic diastolic heart failure (HCC)    Dementia (HCC)    Diabetes mellitus without complication (HCC)    Heart disease    History of recurrent UTIs    (has chronic indwelling foley catheter)   Hypertension     Significant Hospital Events: Including procedures, antibiotic start and stop dates in addition to other pertinent events   04/09/2022 - Arrived to [facility], unresponsive, hypotensive, intubated, CL placed, started on NE and Vaso. On Meropenem, Flagyl, Vancomycin 04/11/22 Added Epi gtt overnight  Interim History / Subjective:  Patient is on weaning mode this morning.  He is alert and awake with verbal stimulation.  He denies any discomfort. Objective   Blood pressure 108/81, pulse 71, temperature (!) 97.1 F (36.2 C), temperature source Axillary, resp. rate (!) 28, height 5\' 6"  (1.676 m), weight 53.9 kg, SpO2 100 %.    Vent Mode: PSV;CPAP FiO2 (%):  [40 %] 40 % Set Rate:  [16 bmp] 16 bmp Vt Set:  [530 mL] 530 mL PEEP:  [5 cmH20] 5 cmH20 Pressure Support:  [8 cmH20-10 cmH20] 8 cmH20 Plateau Pressure:  [22 cmH20-27 cmH20] 23 cmH20   Intake/Output Summary (Last 24 hours) at 04/14/2022 1247 Last data filed at 04/14/2022 1211 Gross per 24 hour  Intake 2658.11 ml  Output 302 ml  Net 2356.11 ml  Filed Weights   04/12/22 0324 04/13/22 0500 04/14/22 0410  Weight: 46.3 kg 50.5 kg 53.9 kg   Physical Examination:   Physical exam: General: Crtitically ill-appearing male, orally intubated HEENT: [place]/AT, eyes anicteric.  ETT and OGT in place Neuro: Awake and alert to verbal stimulation.  Pupils equal reactive to light Chest: no wheezes or rhonchi Heart: Regular rate and rhythm, no murmurs or gallops Abdomen: Soft, nondistended, bowel sounds present Skin: No rash.  Resolved Hospital Problem list     Assessment & Plan:  Septic shock due ESBL E. coli bacteremia, likely from UTI in setting of chronic indwelling catheter  Leukemoid reaction His blood pressure has been labile and his vasopressor support has varied.  Overall the requirement of pressor is slowly coming down. Currently on Levophed and vasopressin.  WBC is trending down as well. -Blood culture grew ESBL E. coli, sensitive results are pending. Continue IV meropenem -Continue titrate vasopressor support with map goal 65 -Resume home midodrine 10 mg TID -Continue stress dose steroids with hydrocortisone -Continue Bair hugger for hypothermia  Acute hypoxic respiratory Failure  Asp PNA Patient was able to tolerate CPAP mode for few hours this morning. -Weaning trial every day -Continue lung protective ventilation -VAP prevention bundle in place -Continue antibiotics -Follow-up respiratory culture -Continue PAD protocol with Precedex with RASS goal -1  Anemia of chronic disease Thrombocytopenia of critical illness -Platelets dropped to 23 this morning.  No signs of bleeding so hold off on platelet transfusion.  Check CBC with differential, blood smear, coag.  His timeline of thrombocytopenia is not consistent with HIT.  4T score is 3 => low probability. -Holding Lovenox -Hemoglobin trending down to 7.  Baseline Hgb ~ 7-8.  Will transfuse 1 units.  Verbal consent obtained from sister  CAD s/p CABG Demand cardiac ischemia EF 65-70%, RV normal on echo 1/31 IV heparin infusion was stopped -Continue holding ASA, Zetia and statin for now as patient is critically ill.    High anion gap metabolic acidosis - resolved -Bmp in AM  Hypokalemia Hypophosphatemia -Repleted  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 New Foley exchanged on admission Continue Foley catheter  DM type II Semglee to 20 units NovoLog 3 units every 4 hours with sliding scale  Hyponatremia - resolved  Severe malnutrition Dietitian  is following Tubefeed  I updated sisters at bedside.  Overall patient is some signs of improvement but remains critically ill.  Will continue monitoring for the next few days.  Sister confirms that the patient is DNR.  Best Practice (right click and "Reselect all SmartList Selections" daily)   Diet/type: NPO tube needs DVT prophylaxis: NA GI prophylaxis: H2B Lines: Central line Foley:  Yes, and it is still needed Code Status:  DNR Last date of multidisciplinary goals of care discussion [04/13/2022 with [name]]  Labs   CBC: Recent Labs  Lab 03/18/2022 1125 03/30/2022 1233 04/08/2022 1732 04/11/22 0040 04/11/22 0152 04/12/22 0449 04/12/22 0827 04/12/22 1134 04/13/22 0154 04/13/22 1819 04/14/22 0352  WBC 6.6  --  37.0* 36.3*  --  60.1*  --   --  48.6*  --  28.0*  NEUTROABS 6.2  --   --   --   --   --   --   --   --   --   --   HGB 9.0*   < > 8.9* 8.7*   < > 7.7* 6.5* 7.1* 7.8* 8.8* 7.0*  HCT 28.8*   < > 28.2* 26.5*   < >  22.1* 19.0* 21.0* 23.5* 26.0* 21.4*  MCV 86.0  --  85.7 82.0  --  78.9*  --   --  79.9*  --  79.3*  PLT 218  --  158 154  --  70*  --   --  38*  --  23*   < > = values in this interval not displayed.     Basic Metabolic Panel: Recent Labs  Lab 04/11/22 0040 04/11/22 0152 04/12/22 0449 04/12/22 0827 04/12/22 0835 04/12/22 1134 04/13/22 0154 04/13/22 1621 04/13/22 1819 04/14/22 0352  NA 131*   < > 132*   < > 137 136 132*  --  139 140  K 4.0   < > 4.0   < > 3.8 3.8 3.8  --  3.8 2.8*  CL 103  --  107  --  104  --  106  --   --  104  CO2 18*  --  12*  --  21*  --  16*  --   --  25  GLUCOSE 207*  --  148*  --  145*  --  163*  --   --  212*  BUN 14  --  20  --  20  --  24*  --   --  29*  CREATININE 0.81  --  0.98  --  0.95  --  0.83  --   --  0.72  CALCIUM 7.4*  --  7.1*  --  6.7*  --  6.8*  --   --  7.8*  MG 1.8  --  1.9  --   --   --  2.2 2.1  --  2.0  PHOS 2.6  --  3.9  --   --   --  3.2 2.7  --  1.9*   < > = values in this interval not displayed.     GFR: Estimated Creatinine Clearance: 75.8 mL/min (by C-G formula based on SCr of 0.72 mg/dL). Recent Labs  Lab Apr 28, 2022 1440 28-Apr-2022 1732 04/11/22 0040 04/11/22 1031 04/11/22 1220 04/11/22 1439 04/11/22 1729 04/12/22 0449 04/13/22 0154 04/14/22 0352  PROCALCITON 6.17  --  118.47  --   --   --   --  >150.00  --   --   WBC  --    < > 36.3*  --   --   --   --  60.1* 48.6* 28.0*  LATICACIDVEN  --   --   --  2.7* 2.0* 2.2* 2.2*  --   --   --    < > = values in this interval not displayed.     Liver Function Tests: Recent Labs  Lab Apr 28, 2022 1125 04/11/22 0151  AST 41  --   ALT 13  --   ALKPHOS 159*  --   BILITOT 0.6  --   PROT 5.5*  --   ALBUMIN 1.7* <1.5*    No results for input(s): "LIPASE", "AMYLASE" in the last 168 hours. No results for input(s): "AMMONIA" in the last 168 hours.  ABG    Component Value Date/Time   PHART 7.596 (H) 04/13/2022 1819   PCO2ART 22.7 (L) 04/13/2022 1819   PO2ART 178 (H) 04/13/2022 1819   HCO3 22.2 04/13/2022 1819   TCO2 23 04/13/2022 1819   ACIDBASEDEF 6.0 (H) 04/12/2022 1134   O2SAT 100 04/13/2022 1819     Coagulation Profile: Recent Labs  Lab 2022/04/28 1125  INR 1.2     Cardiac Enzymes: No results  for input(s): "CKTOTAL", "CKMB", "CKMBINDEX", "TROPONINI" in the last 168 hours.  HbA1C: Hgb A1c MFr Bld  Date/Time Value Ref Range Status  01/27/2022 05:32 AM 7.7 (H) 4.8 - 5.6 % Final    Comment:    (NOTE) Pre diabetes:          5.7%-6.4%  Diabetes:              >6.4%  Glycemic control for   <7.0% adults with diabetes   07/03/2021 01:52 AM 10.4 (H) 4.8 - 5.6 % Final    Comment:    (NOTE) Pre diabetes:          5.7%-6.4%  Diabetes:              >6.4%  Glycemic control for   <7.0% adults with diabetes     CBG: Recent Labs  Lab 04/13/22 1933 04/13/22 1945 04/13/22 2320 04/14/22 0337 04/14/22 0759  GLUCAP 97 Weatherby, DO Internal Medicine Residency My pager: [phone]

## 2022-04-14 NOTE — Progress Notes (Signed)
Pharmacy Antibiotic Note  Charles [name] is a 62 y.o. male admitted on 03/17/2022 with sepsis and ESBL Ecoli bacteremia (susceptibilities pending).  Pharmacy has been consulted for Meropenem dosing. SCr stable 0.72 (at baseline; noted low muscle mass).  Plan: Increase meropenem to 2g IV Q8H per discussion with CCM with critical illness Monitor clinical progress, c/s, renal function, LOT   Height: 5\' 6"  (167.6 cm) Weight: 53.9 kg (118 lb 13.3 oz) IBW/kg (Calculated) : 63.8  Temp (24hrs), Avg:97.6 F (36.4 C), Min:97.1 F (36.2 C), Max:97.8 F (36.6 C)  Recent Labs  Lab 03/17/2022 1419 03/15/2022 1732 04/11/22 0040 04/11/22 1031 04/11/22 1220 04/11/22 1439 04/11/22 1729 04/12/22 0449 04/12/22 0835 04/13/22 0154 04/14/22 0352  WBC  --  37.0* 36.3*  --   --   --   --  60.1*  --  48.6* 28.0*  CREATININE  --  0.99 0.81  --   --   --   --  0.98 0.95 0.83 0.72  LATICACIDVEN 5.7*  --   --  2.7* 2.0* 2.2* 2.2*  --   --   --   --      Estimated Creatinine Clearance: 75.8 mL/min (by C-G formula based on SCr of 0.72 mg/dL).    No Known Allergies   Arturo [facility], PharmD, BCPS Please check AMION for all [facility] contact numbers Clinical Pharmacist 04/14/2022 12:49 PM

## 2022-04-15 DIAGNOSIS — R6521 Severe sepsis with septic shock: Secondary | ICD-10-CM | POA: Diagnosis not present

## 2022-04-15 DIAGNOSIS — Z515 Encounter for palliative care: Secondary | ICD-10-CM

## 2022-04-15 DIAGNOSIS — Z7189 Other specified counseling: Secondary | ICD-10-CM | POA: Diagnosis not present

## 2022-04-15 DIAGNOSIS — A419 Sepsis, unspecified organism: Secondary | ICD-10-CM | POA: Diagnosis not present

## 2022-04-15 LAB — CBC
HCT: 29.8 % — ABNORMAL LOW (ref 39.0–52.0)
Hemoglobin: 10.1 g/dL — ABNORMAL LOW (ref 13.0–17.0)
MCH: 27.5 pg (ref 26.0–34.0)
MCHC: 33.9 g/dL (ref 30.0–36.0)
MCV: 81.2 fL (ref 80.0–100.0)
Platelets: 19 10*3/uL — CL (ref 150–400)
RBC: 3.67 MIL/uL — ABNORMAL LOW (ref 4.22–5.81)
RDW: 16.8 % — ABNORMAL HIGH (ref 11.5–15.5)
WBC: 21.8 10*3/uL — ABNORMAL HIGH (ref 4.0–10.5)
nRBC: 0.1 % (ref 0.0–0.2)

## 2022-04-15 LAB — CULTURE, BLOOD (ROUTINE X 2): Special Requests: ADEQUATE

## 2022-04-15 LAB — TYPE AND SCREEN
ABO/RH(D): A POS
Antibody Screen: NEGATIVE
Unit division: 0

## 2022-04-15 LAB — BASIC METABOLIC PANEL
Anion gap: 6 (ref 5–15)
Anion gap: 6 (ref 5–15)
Anion gap: 8 (ref 5–15)
BUN: 36 mg/dL — ABNORMAL HIGH (ref 6–20)
BUN: 35 mg/dL — ABNORMAL HIGH (ref 6–20)
BUN: 35 mg/dL — ABNORMAL HIGH (ref 6–20)
CO2: 24 mmol/L (ref 22–32)
CO2: 25 mmol/L (ref 22–32)
CO2: 27 mmol/L (ref 22–32)
Calcium: 7.3 mg/dL — ABNORMAL LOW (ref 8.9–10.3)
Calcium: 7.3 mg/dL — ABNORMAL LOW (ref 8.9–10.3)
Calcium: 7.6 mg/dL — ABNORMAL LOW (ref 8.9–10.3)
Chloride: 112 mmol/L — ABNORMAL HIGH (ref 98–111)
Chloride: 109 mmol/L (ref 98–111)
Chloride: 106 mmol/L (ref 98–111)
Creatinine, Ser: 0.73 mg/dL (ref 0.61–1.24)
Creatinine, Ser: 0.7 mg/dL (ref 0.61–1.24)
Creatinine, Ser: 0.66 mg/dL (ref 0.61–1.24)
GFR, Estimated: >60 mL/min (ref >60)
GFR, Estimated: >60 mL/min (ref >60)
GFR, Estimated: >60 mL/min (ref >60)
Glucose, Bld: 198 mg/dL — ABNORMAL HIGH (ref 70–99)
Glucose, Bld: 188 mg/dL — ABNORMAL HIGH (ref 70–99)
Glucose, Bld: 202 mg/dL — ABNORMAL HIGH (ref 70–99)
Potassium: 3.7 mmol/L (ref 3.5–5.1)
Potassium: 3.3 mmol/L — ABNORMAL LOW (ref 3.5–5.1)
Potassium: 3.3 mmol/L — ABNORMAL LOW (ref 3.5–5.1)
Sodium: 142 mmol/L (ref 135–145)
Sodium: 140 mmol/L (ref 135–145)
Sodium: 141 mmol/L (ref 135–145)

## 2022-04-15 LAB — PHOSPHORUS
Phosphorus: 2.3 mg/dL — ABNORMAL LOW (ref 2.5–4.6)
Phosphorus: 2.3 mg/dL — ABNORMAL LOW (ref 2.5–4.6)

## 2022-04-15 LAB — MAGNESIUM
Magnesium: 2 mg/dL (ref 1.7–2.4)
Magnesium: 2.3 mg/dL (ref 1.7–2.4)

## 2022-04-15 LAB — GLUCOSE, CAPILLARY
Glucose-Capillary: 188 mg/dL — ABNORMAL HIGH (ref 70–99)
Glucose-Capillary: 197 mg/dL — ABNORMAL HIGH (ref 70–99)
Glucose-Capillary: 170 mg/dL — ABNORMAL HIGH (ref 70–99)
Glucose-Capillary: 198 mg/dL — ABNORMAL HIGH (ref 70–99)
Glucose-Capillary: 179 mg/dL — ABNORMAL HIGH (ref 70–99)
Glucose-Capillary: 212 mg/dL — ABNORMAL HIGH (ref 70–99)

## 2022-04-15 LAB — BPAM RBC
Blood Product Expiration Date: 202402212359
ISSUE DATE / TIME: 202402021655
Unit Type and Rh: 6200

## 2022-04-15 MED ORDER — HYDROCORTISONE SOD SUC (PF) 100 MG IJ SOLR
50.0000 mg | Freq: Two times a day (BID) | INTRAMUSCULAR | Status: AC
  Administered 2022-04-15 – 2022-04-17 (×4): 50 mg via INTRAVENOUS
  Filled 2022-04-15 (×4): qty 2

## 2022-04-15 MED ORDER — POTASSIUM CHLORIDE 10 MEQ/100ML IV SOLN
10.0000 meq | INTRAVENOUS | Status: AC
  Administered 2022-04-15 – 2022-04-16 (×3): 10 meq via INTRAVENOUS
  Filled 2022-04-15 (×3): qty 100

## 2022-04-15 MED ORDER — MAGNESIUM SULFATE 2 GM/50ML IV SOLN
2.0000 g | Freq: Once | INTRAVENOUS | Status: AC
  Administered 2022-04-15: 2 g via INTRAVENOUS
  Filled 2022-04-15: qty 50

## 2022-04-15 MED ORDER — POTASSIUM PHOSPHATES 15 MMOLE/5ML IV SOLN
15.0000 mmol | Freq: Once | INTRAVENOUS | Status: AC
  Administered 2022-04-15: 15 mmol via INTRAVENOUS
  Filled 2022-04-15: qty 5

## 2022-04-15 MED ORDER — METOLAZONE 5 MG PO TABS
5.0000 mg | ORAL_TABLET | Freq: Once | ORAL | Status: AC
  Administered 2022-04-15: 5 mg
  Filled 2022-04-15: qty 1

## 2022-04-15 MED ORDER — ALBUMIN HUMAN 25 % IV SOLN
25.0000 g | Freq: Four times a day (QID) | INTRAVENOUS | Status: AC
  Administered 2022-04-15 (×3): 25 g via INTRAVENOUS
  Filled 2022-04-15 (×3): qty 100

## 2022-04-15 MED ORDER — HYDROCORTISONE SOD SUC (PF) 100 MG IJ SOLR: 25.0000 mg | Freq: Every day | INTRAMUSCULAR | Status: DC

## 2022-04-15 MED ORDER — FUROSEMIDE 10 MG/ML IJ SOLN
40.0000 mg | Freq: Four times a day (QID) | INTRAMUSCULAR | Status: AC
  Administered 2022-04-15 (×3): 40 mg via INTRAVENOUS
  Filled 2022-04-15 (×3): qty 4

## 2022-04-15 MED ORDER — GERHARDT'S BUTT CREAM
TOPICAL_CREAM | Freq: Two times a day (BID) | CUTANEOUS | Status: DC
  Administered 2022-04-15 – 2022-04-16 (×2): 1 via TOPICAL
  Filled 2022-04-15: qty 1

## 2022-04-15 MED ORDER — POTASSIUM CHLORIDE 20 MEQ PO PACK
40.0000 meq | PACK | Freq: Two times a day (BID) | ORAL | Status: DC
  Administered 2022-04-15: 40 meq
  Filled 2022-04-15: qty 2

## 2022-04-15 NOTE — Progress Notes (Signed)
[facility] Progress Note Patient Name: Charles [name] DOB: 07-Feb-1961 MRN: [id number]   Date of Service  04/15/2022  HPI/Events of Note  K+ at 11 AM, was at  3.3, received 15 mmo KPhos and Lasix 40. Repeat K+ @ 550p still 3.3.  eICU Interventions  Cr normal  Replacement protocol ordered     Intervention Category Intermediate Interventions: Electrolyte abnormality - evaluation and management  Elmer Sow 04/15/2022, 8:20 PM

## 2022-04-15 NOTE — Progress Notes (Signed)
Pharmacy Electrolyte Replacement  Recent Labs:  Recent Labs    04/15/22 0327  K 3.7  MG 2.0  PHOS 2.3*  CREATININE 0.73    Low Critical Values (K </= 2.5, Phos </= 1, Mg </= 1) Present: None  Plan: Give Kphos 67mmol x 1. Recheck in AM.   Esmeralda Arthur, PharmD, BCCCP

## 2022-04-15 NOTE — Progress Notes (Signed)
NAME:  Charles [name], MRN:  [id number], DOB:  Nov 06, 1960, LOS: 5 ADMISSION DATE:  07-May-2022, CONSULTATION DATE:  2022-05-07 REFERRING MD:  Doren Custard EDP, CHIEF COMPLAINT:  Unresponsiveness    History of Present Illness:  Charles [name] 62 yo male with a PMH of dementia with progressive neurological decline, DM, chronic D-HF, CAD s/p CABG in 2004, chronic urinary retention w/ indwelling foley, with hx of recurrent UTIs, who presented to [facility] from SNF ([facility]) unresponsive. Per note, pt is normally alert, and SNF staff reports he was normal this morning eating breakfast and then was found unresponsive and hypotensive after breakfast. On arrival to ED patient SBP in the 30's, and thus was intubated on arrival. Further chart review shows he was recently hospitalized November 2023 with sepsis and bacterima d/t UTI as well as December 2023-January 2024 for pyelonephritis.   ED workup reveled K+ 3.2, Total Co2 17, Albumin 1.7, Anion gap 17, Trop 35, LA 7.8, RVP negative. UA with leukocytes, protein, and bacteria. Blood cultures pending. CXR right base atelectasis, CT Head with NAICA with an unchanged 4-5mm colloid cyst. CT Chest Abd Pelvis with Right lower lobe consolidation concerning for PNA vs Asp PNA.   Called sister [name] Psychologist, educational) who lives in [place] to discuss patients status. [name] is concerned and explains that she feel that this was a rather sudden change from when she last talk to him on Friday (04/08/2022). She said she is aware of the patients wishes for not wanting any advanced life sustaining measures, but given the acute change, she would like to continue treating him, however, does not want any CPR in the event that his heart would stop. She is aware of the severity of Dashel's current state, and would like to given him the best chance of recovery as possible. Per notes on 03/14/2022, goals outlined where patient would prefer to die a natural death. Pt has been by palliative care back in  08/2021 where his GOC are outlined with NO feeding Tube.   PCCM consulted for further management.   Pertinent  Medical History   Past Medical History:  Diagnosis Date   Arthritis    Chronic diastolic heart failure (HCC)    Dementia (HCC)    Diabetes mellitus without complication ([place])    Heart disease    History of recurrent UTIs    (has chronic indwelling foley catheter)   Hypertension     Significant Hospital Events: Including procedures, antibiotic start and stop dates in addition to other pertinent events   2022/05/07 - Arrived to [facility], unresponsive, hypotensive, intubated, CL placed, started on NE and Vaso. On Meropenem, Flagyl, Vancomycin 04/11/22 Added Epi gtt overnight 2/3 No acute issues overnight, remains on vent with pressor support   Interim History / Subjective:  Seen lying in bed on vent, able to intermittently follow commands   Objective   Blood pressure 109/65, pulse 67, temperature 97.8 F (36.6 C), temperature source Oral, resp. rate 17, height 5\' 6"  (1.676 m), weight 53.8 kg, SpO2 100 %.    Vent Mode: PRVC FiO2 (%):  [40 %] 40 % Set Rate:  [16 bmp] 16 bmp Vt Set:  [530 mL] 530 mL PEEP:  [5 cmH20] 5 cmH20 Pressure Support:  [8 cmH20] 8 cmH20 Plateau Pressure:  [23 cmH20-24 cmH20] 24 cmH20   Intake/Output Summary (Last 24 hours) at 04/15/2022 0736 Last data filed at 04/15/2022 0600 Gross per 24 hour  Intake 2944.02 ml  Output 434 ml  Net 2510.02 ml  Filed Weights   04/13/22 0500 04/14/22 0410 04/15/22 0541  Weight: 50.5 kg 53.9 kg 53.8 kg   Physical Examination: General: Acute on chronically ill appearing thin deconditioned middle aged male lying in bed on mechanical ventilation, in NAD HEENT: [place]/AT, MM pink/moist, PERRL,  Neuro: Able to intermittently follow commands on vent  CV: s1s2 regular rate and rhythm, no murmur, rubs, or gallops,  PULM:  Clear to auscultation, no increased work of breathing, no added breath, tolerating vent  GI: soft,  bowel sounds hypoactive in all 4 quadrants, non-tender, distended, tolerating TF Extremities: warm/dry, no edema  Skin: no rashes or lesions  Resolved Hospital Problem list   High anion gap metabolic acidosis Hyponatremia   Assessment & Plan:  Septic shock due ESBL E. coli bacteremia,  -Likely from UTI in setting of chronic indwelling catheter. Blood culture grew ESBL E. coli, sensitive results are pending -Remains on pressor support but requirement is downtrending   P: Remains critically ill in the ICU Vent support as below  Continue Meropenem Continue pressors for MAP< 65 Monitor urine output Remains on stress dose steroids  Continue home Midodrine   Acute hypoxic respiratory Failure  Suspicion for aspiration PNA P: Continue ventilator support with lung protective strategies  Wean PEEP and FiO2 for sats greater than 90%. Head of bed elevated 30 degrees. Plateau pressures less than 30 cm H20.  Follow intermittent chest x-ray and ABG.   SAT/SBT as tolerated, mentation preclude extubation  Ensure adequate pulmonary hygiene  Follow cultures  VAP bundle in place  PAD protocol, Continue Precedex drip  Antibiotics as above   Anemia of chronic disease Thrombocytopenia of critical illness -His timeline of thrombocytopenia is not consistent with HIT.  4T score is 3 => low probability. Likely related to septic shock  -S/P 1 unit of PRBC during admission thus far  P: SCD for VTE prophylaxis  Monitor for sings of bleeding  Transfuse for Plt less than 10  CAD s/p CABG Demand cardiac ischemia -EF 65-70%, RV normal on echo 1/31. IV heparin infusion was stopped in the setting of thrombocytopenia  P: Continuous telemetry  Strict intake and output  Daily weight to assess volume status Daily assessment for need to diurese  Closely monitor renal function and electrolytes  Ensure hemodynamic control ASA and statin remains on hold  GDMT as able    Hypokalemia Hypophosphatemia P: Trend Bmet  Supplement as needed   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 -New Foley exchanged on admission P: Continue foley  Frequent peri care   DM type II P: Continue SSI, TF coverage, and long acting insulin   Severe malnutrition P: Continue Tube feeds  Supplement protein as able   I updated sisters at bedside.  Overall patient is some signs of improvement but remains critically ill.  Will continue monitoring for the next few days.  Sister confirms that the patient is DNR.  Best Practice (right click and "Reselect all SmartList Selections" daily)   Diet/type: NPO tube needs DVT prophylaxis: NA GI prophylaxis: H2B Lines: Central line Foley:  Yes, and it is still needed Code Status:  DNR Last date of multidisciplinary goals of care discussion: Continue to update patient and family daily   Critical care:  CRITICAL CARE Performed by: Imani Sherrin D. Harris   Total critical care time: 38 minutes  Critical care time was exclusive of separately billable procedures and treating other patients.  Critical care was necessary to treat or prevent imminent or life-threatening deterioration.  Critical care was time spent personally by me on the following activities: development of treatment plan with patient and/or surrogate as well as nursing, discussions with consultants, evaluation of patient's response to treatment, examination of patient, obtaining history from patient or surrogate, ordering and performing treatments and interventions, ordering and review of laboratory studies, ordering and review of radiographic studies, pulse oximetry and re-evaluation of patient's condition.  Shalaine Payson D. Harris, NP-C [facility] Personal contact information can be found on Amion  If no contact or response made please call [phone] 04/15/2022, 7:39 AM

## 2022-04-15 NOTE — Consult Note (Signed)
Palliative Medicine Inpatient Consult Note  Consulting Provider: DR. Sherrie George  Reason for consult:   [facility] Consult  Reason for Consult? critically ill   04/15/2022  HPI:  Per intake H&P --> This is a 62 year old gentleman past medical history of dementia, progressive neurologic decline, diabetes, chronic heart failure, coronary artery disease status post CABG in 2004. He presents to the hospital with septic shock and ESBL E. coli bacteremia from a urinary tract infection with known chronic indwelling Foley catheter.    Clinical Assessment/Goals of Care:  *Please note that this is a verbal dictation therefore any spelling or grammatical errors are due to the "[place] One" system interpretation.  I have reviewed medical records including EPIC notes, labs and imaging, received report from bedside RN, assessed the patient who is lying in bed acutely ill in appearance.    I met with patient's sisters to further discuss diagnosis prognosis, GOC, EOL wishes, disposition and options.   I introduced Palliative Medicine as specialized medical care for people living with serious illness. It focuses on providing relief from the symptoms and stress of a serious illness. The goal is to improve quality of life for both the patient and the family.  Medical History Review and Understanding:  Reviewed  Pierson's past medical history inclusive of his recurrent rehospitalization's, his severe malnutrition, his type 2 diabetes, coronary artery disease, cognitive impairment/dementia, GI bleed, recurrent urinary tract infections d/t indwelling foley.   Social History:  Jayel is originally [place]. He had been living in [place] for many years though in early 2022 he got the offer to have a promotion of his job which brought him to [facility]. He was working as a Cabin crew. Unfortunately due to memory  impairment and inability to recall details Kizer was let go of that job shortly thereafter. 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 He has two sisters with whom he is close. He is a Engineer, manufacturing.   Functional and Nutritional State:  Has been living at [facility] and is reliant on staff for all bADLs with the exception of being able to self feed.   Advance Directives:  A detailed discussion was had today regarding advanced directives.    Code Status:  Concepts specific to code status, artifical feeding and hydration, continued IV antibiotics and rehospitalization was had.  The difference between a aggressive medical intervention path  and a palliative comfort care path for this patient at this time was had.   Patients sisters in in agreement with DNAR.   Discussion:  Reviewed with patient's sisters that Arihant has been declining over the past 8 months or so.  They share that for period of time there was a lot of hope that his situation could turn around and improve though it is apparent at this point in time that this is not the case.  They acknowledge how sick he is and that he would not want a life consistent with prolonged artificial measures of support.  Patient sisters had spoken with Dr. Valeta Harms this afternoon who was honest in the setting of patient's disease processes.  And strongly recommended an emphasis of comfort care.  Patient's sister share that they have 2 family members 1 is coming from [place] and 1 from [place] tomorrow to see [place].   On Monday patient's sisters plan to proceed with the plan for comfort care.  I was able to describe comfort care in  greater detail for clarity:  We talked about transition to comfort measures in house and what that would entail inclusive of medications to control pain, dyspnea, agitation, nausea, itching, and hiccups.   We discussed stopping all uneccessary measures such as cardiac monitoring, blood draws, needle sticks, and frequent vital  signs. Utilized reflective listening throughout our time together.  Patient's sisters were concerned that we will be accelerating the death process through initiation of medications.  I shared that the point of medication management is to alleviate any undue suffering and enable a peaceful passing from this earth.  Patient's sisters were understanding of that and felt more comfortable after descriptions were provided.  Discussed the importance of continued conversation with family and their  medical providers regarding overall plan of care and treatment options, ensuring decisions are within the context of the patients values and GOCs.  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, manufacturing --> Prayer is appreciated  Ongoing palliative care support  Code Status/Advance Care Planning: DNAR  Palliative Prophylaxis:  Aspiration, Bowel Regimen, Delirium Protocol, Frequent Pain Assessment, Oral Care, Palliative Wound Care, and Turn Reposition  Additional Recommendations (Limitations, Scope, Preferences): Continue current care  Psycho-social/Spiritual:  Desire for further Chaplaincy support: Yes Additional Recommendations: Education on what end-of-life care   Prognosis: Limited hours to days wants artificial support is withdrawn  Discharge Planning: Discharge will likely be celestial  Vitals:   04/15/22 1011 04/15/22 1117  BP: 136/73   Pulse:  75   Resp: 17   Temp:  97.6 F (36.4 C)  SpO2: 100%     Intake/Output Summary (Last 24 hours) at 04/15/2022 1146 Last data filed at 04/15/2022 0950 Gross per 24 hour  Intake 2605.67 ml  Output 380 ml  Net 2225.67 ml   Last Weight  Most recent update: 04/15/2022  5:41 AM    Weight  53.8 kg (118 lb 9.7 oz)            Gen: 59 [place] male acutely ill in appearance HEENT: ETT, OGT, dry mucous membranes CV: Regular rate and rhythm PULM: On mechanical ventilator ABD: Distended and taught EXT: Generalized edema Neuro: Somnolent  PPS: 10%   This conversation/these recommendations were discussed with patient primary care team, Dr. Valeta Harms  Billing based on MDM: High  Problems Addressed: One acute or chronic illness or injury that poses a threat to life or bodily function  Amount and/or Complexity of Data: Category 3:Discussion of management or test interpretation with external physician/other qualified health care professional/appropriate source (not separately reported)  Risks: Decision regarding hospitalization or escalation of hospital care and Decision not to resuscitate or to de-escalate care because of poor prognosis ______________________________________________________ [place] Team Team Cell Phone: [phone] Please utilize secure chat with additional questions, if there is no response within 30 minutes please call the above phone number  Palliative Medicine Team providers are available by phone from 7am to 7pm daily and can  be reached through the team cell phone.  Should this patient require assistance outside of these hours, please call the patient's attending physician.

## 2022-04-15 NOTE — Plan of Care (Signed)
  Interdisciplinary Goals of Care Family Meeting   Date carried out: 04/15/2022  Location of the meeting: Bedside  Member's involved: Physician and Bedside Registered Nurse  Durable Power of Attorney or acting medical decision maker: Sisters present at bedside  Discussion: We discussed goals of care for [facility] .  We discussed his current medical comorbidities.  He has baseline functional status prior to coming to the hospital.  They are very well aware that he would not want prolonged mechanical life support.  We talked about transition to comfort care and what that would look like.  They wanted to give him some time to see if he was improving at all.  We talked about his current medical condition and how it has changed over the past 24 to 48 hours.  They are going to talk amongst themselves and decide whether or not they would consider transition to comfort care.  They are thinking about this at the moment. At the time we will continue current medical management.  Code status:   Code Status: DNR   Disposition: Continue current acute care  Time spent for the meeting: 18 mins     Garner Nash, DO  04/15/2022, 10:41 AM

## 2022-04-15 NOTE — Progress Notes (Signed)
Manufacturing [facility]) Hospice hospital liaison note  Hospital liaison continues to follow peripherally for patient who is referred to hospice services upon discharge from the hospital.   Please don't hesitate to reach out for any hospice related questions or concerns.  Jhonnie Garner, Therapist, sports, BSN Hospice hospital liaison [phone]

## 2022-04-15 NOTE — Progress Notes (Signed)
   04/15/22 1340  Spiritual Encounters  Type of Visit Initial  Care provided to: Family  Conversation partners present during encounter Nurse  Referral source Family  Reason for visit End-of-life  OnCall Visit Yes  Spiritual Framework  Presenting Themes Rituals and practive  Family Stress Factors Health changes  Interventions  Spiritual Care Interventions Made Prayer;Reflective listening   Chaplain responded to a consult - patient's family requesting prayer as patient likely transitions to comfort care. Family is Hindu, patient is [place]. Chaplain offered prayer and support.Chaplain introduced spiritual care services. Spiritual care services available as needed.   Jeri Lager, Chaplain 04/15/22

## 2022-04-16 DIAGNOSIS — Z515 Encounter for palliative care: Secondary | ICD-10-CM | POA: Diagnosis not present

## 2022-04-16 DIAGNOSIS — R6521 Severe sepsis with septic shock: Secondary | ICD-10-CM | POA: Diagnosis not present

## 2022-04-16 DIAGNOSIS — A419 Sepsis, unspecified organism: Secondary | ICD-10-CM | POA: Diagnosis not present

## 2022-04-16 LAB — BASIC METABOLIC PANEL
Anion gap: 9 (ref 5–15)
Anion gap: 7 (ref 5–15)
BUN: 32 mg/dL — ABNORMAL HIGH (ref 6–20)
BUN: 35 mg/dL — ABNORMAL HIGH (ref 6–20)
CO2: 30 mmol/L (ref 22–32)
CO2: 30 mmol/L (ref 22–32)
Calcium: 8.1 mg/dL — ABNORMAL LOW (ref 8.9–10.3)
Calcium: 7.9 mg/dL — ABNORMAL LOW (ref 8.9–10.3)
Chloride: 101 mmol/L (ref 98–111)
Chloride: 102 mmol/L (ref 98–111)
Creatinine, Ser: 0.73 mg/dL (ref 0.61–1.24)
Creatinine, Ser: 0.81 mg/dL (ref 0.61–1.24)
GFR, Estimated: >60 mL/min (ref >60)
GFR, Estimated: >60 mL/min (ref >60)
Glucose, Bld: 235 mg/dL — ABNORMAL HIGH (ref 70–99)
Glucose, Bld: 219 mg/dL — ABNORMAL HIGH (ref 70–99)
Potassium: 3.5 mmol/L (ref 3.5–5.1)
Potassium: 3.6 mmol/L (ref 3.5–5.1)
Sodium: 140 mmol/L (ref 135–145)
Sodium: 139 mmol/L (ref 135–145)

## 2022-04-16 LAB — GLUCOSE, CAPILLARY
Glucose-Capillary: 223 mg/dL — ABNORMAL HIGH (ref 70–99)
Glucose-Capillary: 123 mg/dL — ABNORMAL HIGH (ref 70–99)
Glucose-Capillary: 146 mg/dL — ABNORMAL HIGH (ref 70–99)
Glucose-Capillary: 253 mg/dL — ABNORMAL HIGH (ref 70–99)
Glucose-Capillary: 162 mg/dL — ABNORMAL HIGH (ref 70–99)
Glucose-Capillary: 146 mg/dL — ABNORMAL HIGH (ref 70–99)
Glucose-Capillary: 123 mg/dL — ABNORMAL HIGH (ref 70–99)

## 2022-04-16 LAB — CBC
HCT: 22 % — ABNORMAL LOW (ref 39.0–52.0)
Hemoglobin: 7.7 g/dL — ABNORMAL LOW (ref 13.0–17.0)
MCH: 28 pg (ref 26.0–34.0)
MCHC: 35 g/dL (ref 30.0–36.0)
MCV: 80 fL (ref 80.0–100.0)
Platelets: 24 10*3/uL — CL (ref 150–400)
RBC: 2.75 MIL/uL — ABNORMAL LOW (ref 4.22–5.81)
RDW: 17.3 % — ABNORMAL HIGH (ref 11.5–15.5)
WBC: 24 10*3/uL — ABNORMAL HIGH (ref 4.0–10.5)
nRBC: 0.3 % — ABNORMAL HIGH (ref 0.0–0.2)

## 2022-04-16 LAB — PHOSPHORUS: Phosphorus: 2.7 mg/dL (ref 2.5–4.6)

## 2022-04-16 LAB — MAGNESIUM: Magnesium: 1.9 mg/dL (ref 1.7–2.4)

## 2022-04-16 MED ORDER — FUROSEMIDE 10 MG/ML IJ SOLN
40.0000 mg | Freq: Once | INTRAMUSCULAR | Status: AC
  Administered 2022-04-16: 40 mg via INTRAVENOUS
  Filled 2022-04-16: qty 4

## 2022-04-16 MED ORDER — MAGNESIUM SULFATE 2 GM/50ML IV SOLN
2.0000 g | Freq: Once | INTRAVENOUS | Status: AC
  Administered 2022-04-16: 2 g via INTRAVENOUS
  Filled 2022-04-16: qty 50

## 2022-04-16 MED ORDER — POTASSIUM CHLORIDE 20 MEQ PO PACK
40.0000 meq | PACK | Freq: Once | ORAL | Status: AC
  Administered 2022-04-16: 40 meq
  Filled 2022-04-16: qty 2

## 2022-04-16 MED ORDER — INSULIN GLARGINE-YFGN 100 UNIT/ML ~~LOC~~ SOLN
25.0000 [IU] | Freq: Every day | SUBCUTANEOUS | Status: DC
  Administered 2022-04-16 – 2022-04-17 (×2): 25 [IU] via SUBCUTANEOUS
  Filled 2022-04-16 (×2): qty 0.25

## 2022-04-16 NOTE — Progress Notes (Signed)
[place] 3M08 [facility]) Hospice hospital liaison note  [facility] liaison continues to follow for disposition planning, noted in PMT note from today that plan is for comfort care once family visits.   Please don't hesitate to reach out for any hospice related questions or concerns.    Jhonnie Garner, Therapist, sports, BSN Hospice hospital liaison note [phone]

## 2022-04-16 NOTE — Progress Notes (Signed)
   Palliative Medicine Inpatient Follow Up Note   HPI: This is a 62 year old gentleman past medical history of dementia, progressive neurologic decline, diabetes, chronic heart failure, coronary artery disease status post CABG in 2004. He presents to the hospital with septic shock and ESBL E. coli bacteremia from a urinary tract infection with known chronic indwelling Foley catheter.    Palliative care involved to further support goals of care conversations.   Today's Discussion 04/16/2022  *Please note that this is a verbal dictation therefore any spelling or grammatical errors are due to the "[place] One" system interpretation.  Chart reviewed inclusive of vital signs, progress notes, laboratory results, and diagnostic images.   I met with patients sisters this morning at bedside. They share that they have no additional questions from our conversation yesterday. They confirm the plan for additional family to fly in and visit today.   Created space and opportunity for patients sisters to explore thoughts feelings and fears regarding current medical situation. They share that they know he is in poor health and that he would not want to suffer or live on artificial measures indefinitely.  Plan for family to speak amongst themselves and visit with [facility] today. Transition to comfort care tomorrow.  Summarized and informed patients sisters of what transition to comfort care looks like. Provided information on crematoriums.   Questions and concerns addressed/Palliative Support Provided.   Objective Assessment: Vital Signs Vitals:   04/16/22 1210 04/16/22 1213  BP:    Pulse: (!) 144   Resp: (!) 38   Temp:    SpO2: 100% 100%    Intake/Output Summary (Last 24 hours) at 04/16/2022 1234 Last data filed at 04/16/2022 1227 Gross per 24 hour  Intake 2824.91 ml  Output 7000 ml  Net -4175.09 ml   Last Weight  Most recent update: 04/16/2022  6:38 AM    Weight  50.4 kg (111 lb 1.8 oz)             Gen: 46 [place] male acutely ill in appearance HEENT: ETT, OGT, dry mucous membranes CV: Regular rate and rhythm PULM: On mechanical ventilator ABD: Distended and taught EXT: Generalized edema Neuro: Somnolent but arouses  SUMMARY OF RECOMMENDATIONS   DNAR   A discussion was held with CCM which --> Patient's two sisters who are looking towards conversion to comfort care on Monday   Plan for two additional family members to fly in tomorrow for the opportunity to say their goodbyes today   A formal description of what comfort care looks like was held --> reviewed that comfort oriented care does not hasten life but rather keeps patient's comfortable as they transition from this world to the next   [place]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  Should this patient require assistance outside of these hours, please call the patient's attending physician.

## 2022-04-16 NOTE — Progress Notes (Addendum)
NAME:  Charles [name], MRN:  [id number], DOB:  [birth year], LOS: 6 ADMISSION DATE:  2022/04/17, CONSULTATION DATE:  2022/04/17 REFERRING MD:  Doren Custard EDP, CHIEF COMPLAINT:  Unresponsiveness    History of Present Illness:  Charles [name] 62 yo male with a PMH of dementia with progressive neurological decline, DM, chronic D-HF, CAD s/p CABG in 2004, chronic urinary retention w/ indwelling foley, with hx of recurrent UTIs, who presented to [facility] from SNF ([facility]) unresponsive. Per note, pt is normally alert, and SNF staff reports he was normal this morning eating breakfast and then was found unresponsive and hypotensive after breakfast. On arrival to ED patient SBP in the 30's, and thus was intubated on arrival. Further chart review shows he was recently hospitalized November 2023 with sepsis and bacterima d/t UTI as well as December 2023-January 2024 for pyelonephritis.   ED workup reveled K+ 3.2, Total Co2 17, Albumin 1.7, Anion gap 17, Trop 35, LA 7.8, RVP negative. UA with leukocytes, protein, and bacteria. Blood cultures pending. CXR right base atelectasis, CT Head with NAICA with an unchanged 4-51mm colloid cyst. CT Chest Abd Pelvis with Right lower lobe consolidation concerning for PNA vs Asp PNA.   Called sister [name] Psychologist, educational) who lives in [place] to discuss patients status. [name] is concerned and explains that she feel that this was a rather sudden change from when she last talk to him on Friday (04/08/2022). She said she is aware of the patients wishes for not wanting any advanced life sustaining measures, but given the acute change, she would like to continue treating him, however, does not want any CPR in the event that his heart would stop. She is aware of the severity of Tashan's current state, and would like to given him the best chance of recovery as possible. Per notes on 03/14/2022, goals outlined where patient would prefer to die a natural death. Pt has been by palliative care back in  08/2021 where his GOC are outlined with NO feeding Tube.   PCCM consulted for further management.   Pertinent  Medical History   Past Medical History:  Diagnosis Date   Arthritis    Chronic diastolic heart failure (HCC)    Dementia (HCC)    Diabetes mellitus without complication ([place])    Heart disease    History of recurrent UTIs    (has chronic indwelling foley catheter)   Hypertension     Significant Hospital Events: Including procedures, antibiotic start and stop dates in addition to other pertinent events   04-17-2022 - Arrived to [facility], unresponsive, hypotensive, intubated, CL placed, started on NE and Vaso. On Meropenem, Flagyl, Vancomycin 04/11/22 Added Epi gtt overnight 2/3 No acute issues overnight, remains on vent with pressor support   Interim History / Subjective:  No events overnight. This AM on 4 mcg Levophed and 0.03 vasopressin   Objective   Blood pressure (!) 110/57, pulse 79, temperature 98.2 F (36.8 C), temperature source Axillary, resp. rate (!) 26, height 5\' 6"  (1.676 m), weight 50.4 kg, SpO2 100 %.    Vent Mode: PRVC FiO2 (%):  [30 %-40 %] 40 % Set Rate:  [16 bmp] 16 bmp Vt Set:  [530 mL] 530 mL PEEP:  [5 cmH20] 5 cmH20 Pressure Support:  [8 cmH20] 8 cmH20 Plateau Pressure:  [16 cmH20-19 cmH20] 16 cmH20   Intake/Output Summary (Last 24 hours) at 04/16/2022 0842 Last data filed at 04/16/2022 0700 Gross per 24 hour  Intake 2925.74 ml  Output 6851 ml  Net -3925.26 ml   Filed Weights   04/14/22 0410 04/15/22 0541 04/16/22 0500  Weight: 53.9 kg 53.8 kg 50.4 kg   Physical Examination: General: Chronically ill appearing thin deconditioned middle aged male lying in bed on mechanical ventilation, in NAD HEENT: [place]/AT, MM pink/moist, PERRL,  Neuro: Alert, Able to intermittently follow commands on vent  CV: RRR, HR 102, no mRG PULM:  Clear to auscultation, vent assisted breaths  GI: soft, bowel sounds hypoactive in all 4 quadrants, non-tender, distended,  tolerating TF Extremities: warm/dry, no edema  Skin: no rashes or lesions  Resolved Hospital Problem list   High anion gap metabolic acidosis Hyponatremia   Assessment & Plan:   Septic shock due ESBL E. coli bacteremia,  -Likely from UTI in setting of chronic indwelling catheter. Blood culture grew ESBL E. coli, sensitive results are pending -Remains on pressor support but requirement is downtrending   P: Remains critically ill in the ICU Vent support as below  Continue Meropenem Repeat Cultures pending  Continue pressors for Systolic Goal >95 (currently on 4 mcg levophed and 0.03 vasopressin). D/C Vasopressin  Monitor urine output Remains on stress dose steroids >> on Taper  Continue home Midodrine   Acute hypoxic respiratory Failure  Suspicion for aspiration PNA P: Continue ventilator support with lung protective strategies  Wean PEEP and FiO2 for sats greater than 90%. SAT/SBT as tolerated, mentation preclude extubation >> Improved. Will talk to family today regarding plans.  Ensure adequate pulmonary hygiene  Follow cultures  VAP bundle in place  PAD protocol, Continue Precedex drip  Antibiotics as above   Anemia of chronic disease Thrombocytopenia of critical illness > Improving  -His timeline of thrombocytopenia is not consistent with HIT.  4T score is 3 => low probability. Likely related to septic shock  -S/P 1 unit of PRBC during admission thus far  P: SCD for VTE prophylaxis  Monitor for sings of bleeding  Transfuse for Plt less than 10   CAD s/p CABG Demand cardiac ischemia -EF 65-70%, RV normal on echo 1/31. 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 -New Foley exchanged on admission P: Continue foley  Frequent peri care   DM type II P: Continue SSI, TF coverage, and long acting insulin >> increase long acting to 25 units daily   Severe malnutrition P: Continue Tube feeds  Supplement protein as able   Best Practice (right click and "Reselect all SmartList Selections" daily)   Diet/type: NPO tube needs DVT prophylaxis: NA GI prophylaxis: H2B Lines: Central line Foley:  Yes, and it is still needed Code Status:  DNR Last date of multidisciplinary goals of care discussion: Continue to update patient and family daily  GOC: Palliative Care Following. Possible comfort care transition 2/5. >> will reach out to family to clarify   Critical care:  CRITICAL CARE Performed by: Omar Person   Total critical care time: 32 minutes  Critical care time was exclusive of separately billable procedures and treating other patients.  Critical care was necessary to treat or prevent imminent or life-threatening deterioration.  Critical care was time spent personally by  me on the following activities: development of treatment plan with patient and/or surrogate as well as nursing, discussions with consultants, evaluation of patient's response to treatment, examination of patient, obtaining history from patient or surrogate, ordering and performing treatments and interventions, ordering and review of laboratory studies, ordering and review of radiographic studies, pulse oximetry and re-evaluation of patient's condition.  Hayden Pedro, AGACNP-BC [facility]  PCCM Pgr: [phone]

## 2022-04-17 DIAGNOSIS — R6521 Severe sepsis with septic shock: Secondary | ICD-10-CM | POA: Diagnosis not present

## 2022-04-17 DIAGNOSIS — A419 Sepsis, unspecified organism: Secondary | ICD-10-CM | POA: Diagnosis not present

## 2022-04-17 LAB — BLOOD CULTURE ID PANEL (REFLEXED) - BCID2

## 2022-04-17 LAB — MAGNESIUM: Magnesium: 2.3 mg/dL (ref 1.7–2.4)

## 2022-04-17 LAB — CBC
HCT: 29.3 % — ABNORMAL LOW (ref 39.0–52.0)
Hemoglobin: 9.9 g/dL — ABNORMAL LOW (ref 13.0–17.0)
MCH: 27.7 pg (ref 26.0–34.0)
MCHC: 33.8 g/dL (ref 30.0–36.0)
MCV: 82.1 fL (ref 80.0–100.0)
Platelets: 57 10*3/uL — ABNORMAL LOW (ref 150–400)
RBC: 3.57 MIL/uL — ABNORMAL LOW (ref 4.22–5.81)
RDW: 18.1 % — ABNORMAL HIGH (ref 11.5–15.5)
WBC: 31.7 10*3/uL — ABNORMAL HIGH (ref 4.0–10.5)
nRBC: 0.4 % — ABNORMAL HIGH (ref 0.0–0.2)

## 2022-04-17 LAB — GLUCOSE, CAPILLARY
Glucose-Capillary: 120 mg/dL — ABNORMAL HIGH (ref 70–99)
Glucose-Capillary: 166 mg/dL — ABNORMAL HIGH (ref 70–99)

## 2022-04-17 LAB — PHOSPHORUS: Phosphorus: 1.9 mg/dL — ABNORMAL LOW (ref 2.5–4.6)

## 2022-04-17 LAB — POTASSIUM: Potassium: 3.7 mmol/L (ref 3.5–5.1)

## 2022-04-17 MED ORDER — SODIUM CHLORIDE 0.9 % IV SOLN
100.0000 mg | INTRAVENOUS | Status: DC
  Administered 2022-04-17: 100 mg via INTRAVENOUS
  Filled 2022-04-17: qty 5

## 2022-04-17 MED ORDER — GLYCOPYRROLATE 0.2 MG/ML IJ SOLN: 0.2000 mg | INTRAMUSCULAR | Status: DC | PRN

## 2022-04-17 MED ORDER — MORPHINE 100MG IN NS 100ML (1MG/ML) PREMIX INFUSION
0.0000 mg/h | INTRAVENOUS | Status: DC
  Administered 2022-04-17: 5 mg/h via INTRAVENOUS
  Administered 2022-04-18: 10 mg/h via INTRAVENOUS
  Administered 2022-04-18: 5 mg/h via INTRAVENOUS
  Filled 2022-04-17 (×3): qty 100

## 2022-04-17 MED ORDER — SODIUM CHLORIDE 0.9 % IV SOLN: INTRAVENOUS | Status: DC

## 2022-04-17 MED ORDER — GLYCOPYRROLATE 0.2 MG/ML IJ SOLN
0.2000 mg | INTRAMUSCULAR | Status: DC | PRN
  Administered 2022-04-17: 0.2 mg via INTRAVENOUS
  Filled 2022-04-17: qty 1

## 2022-04-17 MED ORDER — NAPHAZOLINE-PHENIRAMINE 0.025-0.3 % OP SOLN
2.0000 [drp] | Freq: Four times a day (QID) | OPHTHALMIC | Status: DC | PRN
  Administered 2022-04-17 (×2): 2 [drp] via OPHTHALMIC
  Filled 2022-04-17: qty 15

## 2022-04-17 MED ORDER — ACETAMINOPHEN 650 MG RE SUPP: 650.0000 mg | Freq: Four times a day (QID) | RECTAL | Status: DC | PRN

## 2022-04-17 MED ORDER — GLYCOPYRROLATE 0.2 MG/ML IJ SOLN
0.4000 mg | INTRAMUSCULAR | Status: DC
  Administered 2022-04-17 – 2022-04-18 (×4): 0.4 mg via INTRAVENOUS
  Filled 2022-04-17 (×5): qty 2

## 2022-04-17 MED ORDER — ACETAMINOPHEN 325 MG PO TABS: 650.0000 mg | ORAL_TABLET | Freq: Four times a day (QID) | ORAL | Status: DC | PRN

## 2022-04-17 MED ORDER — GLYCOPYRROLATE 1 MG PO TABS: 1.0000 mg | ORAL_TABLET | ORAL | Status: DC | PRN

## 2022-04-17 MED ORDER — POLYVINYL ALCOHOL 1.4 % OP SOLN: 1.0000 [drp] | Freq: Four times a day (QID) | OPHTHALMIC | Status: DC | PRN

## 2022-04-17 MED ORDER — MORPHINE BOLUS VIA INFUSION
5.0000 mg | INTRAVENOUS | Status: DC | PRN
  Administered 2022-04-18 (×2): 5 mg via INTRAVENOUS

## 2022-04-17 NOTE — Progress Notes (Signed)
[facility])  Plan was for patient to be admitted at [place] with [facility] hospice services. However, patient was transferred to [facility] prior to admission. [facility] has continued to follow peripherally through admission. Per NP/Mary Davis Gourd, patient is an anticipated hospital death and advised that [facility] can sign-off.   [facility] to sign-off but are available if assistance is needed.      Please call with any questions/concerns.    Thank you for the opportunity to participate in this patient's care.   Phillis Haggis, MSW  [facility] Liaison  [phone]

## 2022-04-17 NOTE — Progress Notes (Signed)
Patient ID: Charles [name], male   DOB: [birth year], 62 y.o.   MRN: [id number]    Progress Note from the Palliative Medicine Team at [facility]   Patient Name: Charles [name]        Date: 04/17/2022 DOB: May 19, 1960  Age: 62 y.o. MRN#: [id number] Attending Physician: Garner Nash, DO Primary Care Physician: Tyrone Schimke., MD Admit Date: 03/14/2022   Medical records reviewed   62 year old gentleman past medical history of dementia, progressive neurologic decline, diabetes, chronic heart failure, coronary artery disease status post CABG in 2004. He presents to the hospital with septic shock and ESBL E. coli bacteremia from a urinary tract infection with known chronic indwelling Foley catheter.     Family has made decsin to shift to comfort care, compassionate extubation today.   I spoke to treatment team, extubation orders in place and family fully supported.  Spiritual care involved.  Expect hospital death  No charge   Wadie Lessen NP  Palliative Medicine Team Team Phone # [phone] Pager [phone]

## 2022-04-17 NOTE — Progress Notes (Signed)
   04/17/22 1445  Spiritual Encounters  Type of Visit Follow up  Care provided to: Pt and family  Conversation partners present during encounter Nurse  Referral source Family  Reason for visit End-of-life  OnCall Visit No  Interventions  Spiritual Care Interventions Made Compassionate presence;Normalization of emotions;Bereavement/grief support;Prayer  Intervention Outcomes  Outcomes Patient family open to resources   Responded to page that family requested prayer for man being extubated soon.  Checking chart notes I see that family is Hindu and patient is [place].  I offered Darrick Meigs prayer and was able to help the family share some memories and favorite things about the pt.  Remained with them while pt was extubated.  Chp left the family to say their goodbyes in private.  Remain available should they need further support.

## 2022-04-17 NOTE — Progress Notes (Addendum)
NAME:  Charles [name], MRN:  [id number], DOB:  October 20, 1960, LOS: 7 ADMISSION DATE:  17-Apr-2022, CONSULTATION DATE:  04/17/2022 REFERRING MD:  Doren Custard EDP, CHIEF COMPLAINT:  Unresponsiveness    History of Present Illness:  Charles [name] 62 yo male with a PMH of dementia with progressive neurological decline, DM, chronic D-HF, CAD s/p CABG in 2004, chronic urinary retention w/ indwelling foley, with hx of recurrent UTIs, who presented to [facility] from SNF ([facility]) unresponsive. Per note, pt is normally alert, and SNF staff reports he was normal this morning eating breakfast and then was found unresponsive and hypotensive after breakfast. On arrival to ED patient SBP in the 30's, and thus was intubated on arrival. Further chart review shows he was recently hospitalized November 2023 with sepsis and bacterima d/t UTI as well as December 2023-January 2024 for pyelonephritis.   ED workup reveled K+ 3.2, Total Co2 17, Albumin 1.7, Anion gap 17, Trop 35, LA 7.8, RVP negative. UA with leukocytes, protein, and bacteria. Blood cultures pending. CXR right base atelectasis, CT Head with NAICA with an unchanged 4-63mm colloid cyst. CT Chest Abd Pelvis with Right lower lobe consolidation concerning for PNA vs Asp PNA.   Called sister Charles [name] Psychologist, educational) who lives in [place] to discuss patients status. Charles [name] is concerned and explains that she feel that this was a rather sudden change from when she last talk to him on Friday (04/08/2022). She said she is aware of the patients wishes for not wanting any advanced life sustaining measures, but given the acute change, she would like to continue treating him, however, does not want any CPR in the event that his heart would stop. She is aware of the severity of Charles [name]'s current state, and would like to given him the best chance of recovery as possible. Per notes on 03/14/2022, goals outlined where patient would prefer to die a natural death. Pt has been by palliative care back in  08/2021 where his GOC are outlined with NO feeding Tube.   PCCM consulted for further management.   Pertinent  Medical History   Past Medical History:  Diagnosis Date   Arthritis    Chronic diastolic heart failure (HCC)    Dementia (HCC)    Diabetes mellitus without complication ([place])    Heart disease    History of recurrent UTIs    (has chronic indwelling foley catheter)   Hypertension     Significant Hospital Events: Including procedures, antibiotic start and stop dates in addition to other pertinent events   04-17-2022 - Arrived to [facility], unresponsive, hypotensive, intubated, CL placed, started on NE and Vaso. On Meropenem, Flagyl, Vancomycin 04/11/22 Added Epi gtt overnight 2/3 No acute issues overnight, remains on vent with pressor support  2/5 On full support this am, tachy, plan for one way extubation 2/5 at 1:30 pm 2/5 Candida per [facility] 1/29>> will give one time dose to treat  Interim History / Subjective:  Overnight increased Levo needs . Increased tachycardia. Now  on 9 mcg/min of Levophed, alert, not following commands  Plan for one way extubation today at 1:30 pm. Plan is to extubate on Levophed, then if she does not pass, the family will discontinue BP support also. Candida per [facility] Apr 17, 2022  Objective   Blood pressure 93/61, pulse (!) 133, temperature 98.9 F (37.2 C), temperature source Axillary, resp. rate 20, height 5\' 6"  (1.676 m), weight 51.3 kg, SpO2 100 %.    Vent Mode: PRVC FiO2 (%):  [30 %-40 %] 30 %  Set Rate:  [18 bmp] 18 bmp Vt Set:  [510 mL] 510 mL PEEP:  [5 cmH20] 5 cmH20 Pressure Support:  [8 cmH20] 8 cmH20 Plateau Pressure:  [17 cmH20-20 cmH20] 20 cmH20   Intake/Output Summary (Last 24 hours) at 04/17/2022 1610 Last data filed at 04/17/2022 0400 Gross per 24 hour  Intake 1557.12 ml  Output 1515 ml  Net 42.12 ml   Filed Weights   04/15/22 0541 04/16/22 0500 04/17/22 0500  Weight: 53.8 kg 50.4 kg 51.3 kg   Physical Examination: General: Chronically ill  appearing thin deconditioned middle aged male lying in bed on mechanical ventilation, in NAD, tachycardia HEENT: [place]/AT, MM pink/moist, PERRL,  Neuro: Alert, currently not following commands , MAE x 4 CV: RRR, HR 145, no mRG PULM:  Bilateral Chest excursion, Clear throughout, diminished per bases, minimal secretions  GI: soft, bowel sounds hypoactive in all 4 quadrants, non-tender, distended, tolerating TF Extremities: thin, muscle wasting, warm/dry, no edema , pressure dressings to pressure points Skin: no rashes or lesions, no lesions   Labs 2/5: WBC 31.7 HGB 9.9 HCT 29.3 Platelets 57 Mag 2.3, Phos 1.9 Net + 13 L per I&O Afebrile, T Max 98.7  Resolved Hospital Problem list   High anion gap metabolic acidosis Hyponatremia   Assessment & Plan:   Septic shock due ESBL E. coli bacteremia,  -Likely from UTI in setting of chronic indwelling catheter. Blood culture grew ESBL E. coli, sensitive results are pending -Remains on pressor support , increased need overnight  - Candida per [facility] 1/29 P: Remains critically ill in the ICU Vent support as below  Continue Meropenem Will plan one time dose of micafungin to treat + candida glabrata Repeat Cultures from 2/4  pending  Continue pressors for Systolic Goal >96 (currently on 9 mcg levophed and  vasopressin is off). D/C Monitor urine output Remains on stress dose steroids >> on Taper  Continue home Midodrine   Acute hypoxic respiratory Failure  Suspicion for aspiration PNA P: Continue ventilator support with lung protective strategies  Wean PEEP and FiO2 for sats greater than 90%. SAT/SBT as tolerated, mentation improving >> Plan for 1 way extubation 2/5 at 1:30 pm  Ensure adequate pulmonary hygiene  Follow pending cultures  VAP bundle in place  PAD protocol Antibiotics as above  Prn Morphine for comfort  Anemia of chronic disease Thrombocytopenia of critical illness > Improving  -His timeline of thrombocytopenia is not  consistent with HIT.  4T score is 3 => low probability. Likely related to septic shock >> platelets 57,000 today -S/P 1 unit of PRBC during admission thus far  P: SCD for VTE prophylaxis  Monitor for sings of bleeding  Transfuse for Plt less than 10   CAD s/p CABG Demand cardiac ischemia Tachycardia 2/5 -EF 65-70%, RV normal on echo 1/31. IV heparin infusion was stopped in the setting of thrombocytopenia  P: Continuous telemetry  Strict intake and output  Daily assessment for need to diurese  Trend BMET>> Maintain Mag > 2, and K > 4 Ensure hemodynamic control, MAP goal of 65 ASA and statin remains on hold due to thrombocytopenia  GDMT as able   Hypokalemia Hypophosphatemia P: Trend Bmet  Supplement as needed   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 -New Foley exchanged on admission P: Continue foley  Frequent peri care   DM type II P: Continue SSI, TF coverage, and long acting insulin >> increase long acting to 25 units daily   Severe malnutrition P: Continue  Tube feeds  Supplement protein as able   Best Practice (right click and "Reselect all SmartList Selections" daily)   Diet/type: NPO tube needs DVT prophylaxis: NA GI prophylaxis: H2B Lines: Central line Foley:  Yes, and it is still needed Code Status:  DNR Last date of multidisciplinary goals of care discussion: Continue to update patient and family daily  GOC: Palliative Care Following. Plan for comfort care transition 2/5. >> 1:30 pm  today  Critical care:  CRITICAL CARE Performed by: Magdalen Spatz   Total critical care time: 35 minutes  Critical care time was exclusive of separately billable procedures and treating other patients.  Critical care was necessary to treat or prevent imminent or life-threatening deterioration.  Critical care was time spent personally by me on the following activities: development of treatment plan with patient and/or surrogate as well as nursing,  discussions with consultants, evaluation of patient's response to treatment, examination of patient, obtaining history from patient or surrogate, ordering and performing treatments and interventions, ordering and review of laboratory studies, ordering and review of radiographic studies, pulse oximetry and re-evaluation of patient's condition.  Magdalen Spatz, MSN, AGACNP-BC [place] for personal pager [place] Pgr: [phone] 04/17/2022 7:54 AM

## 2022-04-17 NOTE — Procedures (Signed)
Extubation Procedure Note  Patient Details:   Name: Charles [name] DOB: 1960/09/26 MRN: [id number]   Airway Documentation:    Vent end date: 04/17/22 Vent end time: 1440   Evaluation  O2 sats: currently acceptable Complications: No apparent complications Patient did tolerate procedure well. Bilateral Breath Sounds: Diminished   Patient extubated to 2L [place] per order.   Catha Brow 04/17/2022, 2:49 PM

## 2022-04-17 NOTE — Progress Notes (Signed)
PHARMACY - PHYSICIAN COMMUNICATION CRITICAL VALUE ALERT - BLOOD CULTURE IDENTIFICATION (BCID)  Charles [name] is an 62 y.o. male who presented to [facility] on 04/11/2022 with a chief complaint of unresponsive  Assessment:  Micro called to report of positive for candida glabrata in blood cultures. Plan for comfort care tomorrow.   Name of physician (or Provider) Contacted: Dr. Burnard Leigh  Current antibiotics: Merrem  Changes to prescribed antibiotics recommended:  No need to escalate care due to plan comfort care  Results for orders placed or performed during the hospital encounter of 03/21/2022  Blood Culture ID Panel (Reflexed) (Collected: 03/20/2022  7:04 PM)  Result Value Ref Range   Enterococcus faecalis NOT DETECTED NOT DETECTED   Enterococcus Faecium NOT DETECTED NOT DETECTED   Listeria monocytogenes NOT DETECTED NOT DETECTED   Staphylococcus species NOT DETECTED NOT DETECTED   Staphylococcus aureus (BCID) NOT DETECTED NOT DETECTED   Staphylococcus epidermidis NOT DETECTED NOT DETECTED   Staphylococcus lugdunensis NOT DETECTED NOT DETECTED   Streptococcus species NOT DETECTED NOT DETECTED   Streptococcus agalactiae NOT DETECTED NOT DETECTED   Streptococcus pneumoniae NOT DETECTED NOT DETECTED   Streptococcus pyogenes NOT DETECTED NOT DETECTED   A.calcoaceticus-baumannii NOT DETECTED NOT DETECTED   Bacteroides fragilis NOT DETECTED NOT DETECTED   Enterobacterales NOT DETECTED NOT DETECTED   Enterobacter cloacae complex NOT DETECTED NOT DETECTED   Escherichia coli NOT DETECTED NOT DETECTED   Klebsiella aerogenes NOT DETECTED NOT DETECTED   Klebsiella oxytoca NOT DETECTED NOT DETECTED   Klebsiella pneumoniae NOT DETECTED NOT DETECTED   Proteus species NOT DETECTED NOT DETECTED   Salmonella species NOT DETECTED NOT DETECTED   Serratia marcescens NOT DETECTED NOT DETECTED   Haemophilus influenzae NOT DETECTED NOT DETECTED   Neisseria meningitidis NOT DETECTED NOT DETECTED    Pseudomonas aeruginosa NOT DETECTED NOT DETECTED   Stenotrophomonas maltophilia NOT DETECTED NOT DETECTED   Candida albicans NOT DETECTED NOT DETECTED   Candida auris NOT DETECTED NOT DETECTED   Candida glabrata DETECTED (A) NOT DETECTED   Candida krusei NOT DETECTED NOT DETECTED   Candida parapsilosis NOT DETECTED NOT DETECTED   Candida tropicalis NOT DETECTED NOT DETECTED   Cryptococcus neoformans/gattii NOT DETECTED NOT DETECTED    [name], PharmD, BCIDP, AAHIVP, CPP Infectious Disease Pharmacist 04/17/2022 2:15 AM

## 2022-04-18 DIAGNOSIS — R451 Restlessness and agitation: Secondary | ICD-10-CM

## 2022-04-18 DIAGNOSIS — Z515 Encounter for palliative care: Secondary | ICD-10-CM | POA: Diagnosis not present

## 2022-04-18 DIAGNOSIS — R0609 Other forms of dyspnea: Secondary | ICD-10-CM | POA: Diagnosis not present

## 2022-04-18 DIAGNOSIS — R6521 Severe sepsis with septic shock: Secondary | ICD-10-CM | POA: Diagnosis not present

## 2022-04-18 DIAGNOSIS — A419 Sepsis, unspecified organism: Secondary | ICD-10-CM | POA: Diagnosis not present

## 2022-04-18 MED ORDER — LORAZEPAM 2 MG/ML IJ SOLN: 1.0000 mg | INTRAMUSCULAR | Status: DC | PRN

## 2022-04-21 LAB — CULTURE, BLOOD (ROUTINE X 2)
Culture: NO GROWTH
Culture: NO GROWTH
Special Requests: ADEQUATE
Special Requests: ADEQUATE

## 2022-04-22 LAB — CULTURE, BLOOD (ROUTINE X 2)

## 2022-04-22 LAB — ANTIFUNGAL AST 9 DRUG PANEL
Amphotericin B MIC: 1
Fluconazole Islt MIC: 16
Flucytosine MIC: 0.12
Itraconazole MIC: 1
Posaconazole MIC: 1
Voriconazole MIC: 0.5

## 2022-05-12 NOTE — Progress Notes (Signed)
Pt arrived to [place] room 29. Family at bedside. Morphine drip running at 10. Foley in place. Will continue to monitor pt.

## 2022-05-12 NOTE — Progress Notes (Signed)
Nutrition Brief Note  Chart reviewed. Pt has transitioned to comfort care.  No further nutrition interventions planned at this time.  Please re-consult as needed.    [name] Jurnee Nakayama, MS, RD, LDN Inpatient Clinical Dietitian Please see AMiON for contact information.  

## 2022-05-12 NOTE — Progress Notes (Signed)
Comfort tray ordered for family °

## 2022-05-12 NOTE — Progress Notes (Signed)
Patient ID: [name], male   DOB: 01/15/1961, 62 y.o.   MRN: [id number]    Progress Note from the Palliative Medicine Team at [facility]   Patient Name: Charles [name]        Date: 05/06/22 DOB: 24-Feb-1961  Age: 62 y.o. MRN#: [id number] Attending Physician: Collene Gobble, MD Primary Care Physician: Tyrone Schimke., MD Admit Date: 03/25/2022   Medical records reviewed   62 year old gentleman past medical history of dementia, progressive neurologic decline, diabetes, chronic heart failure, coronary artery disease status post CABG in 2004. He presents to the hospital with septic shock and ESBL E. coli bacteremia from a urinary tract infection with known chronic indwelling Foley catheter.     Family has made decsin to shift to comfort care, compassionate extubation yesterday.   This nurse practitioner assessed patient at bedside, and spoke with family.  Patient appears comfortable on continuous morphine drip and intermittent boluses.  Education offered on the natural trajectory and expectations at end-of-life.  We explored human mortality and family's ultimate hope is for comfort for their brother  Questions and concerns addressed.   Plan of care -DNR/DNI -Focus of care is comfort quality and dignity--allowing for natural death -Symptom management      -Continuous morphine drip with titration for comfort      -Ativan for agitation -Prognosis is likely hours to days  Discussed with Dr. Lamonte Sakai  Total time spent with the family was 60 minutes-greater than 50% of the time was spent in counseling coordination of care.  Wadie Lessen NP  Palliative Medicine Team Team Phone # [phone] Pager [phone]

## 2022-05-12 NOTE — Progress Notes (Signed)
NAME:  Charles [name], MRN:  [id number], DOB:  1961/01/15, LOS: 4 ADMISSION DATE:  2022/04/26, CONSULTATION DATE:  2022/04/26 REFERRING MD:  Doren Custard EDP, CHIEF COMPLAINT:  Unresponsiveness    History of Present Illness:  Charles [name] 62 yo male with a PMH of dementia with progressive neurological decline, DM, chronic D-HF, CAD s/p CABG in 2004, chronic urinary retention w/ indwelling foley, with hx of recurrent UTIs, who presented to [facility] from SNF ([facility]) unresponsive. Per note, pt is normally alert, and SNF staff reports he was normal this morning eating breakfast and then was found unresponsive and hypotensive after breakfast. On arrival to ED patient SBP in the 30's, and thus was intubated on arrival. Further chart review shows he was recently hospitalized November 2023 with sepsis and bacterima d/t UTI as well as December 2023-January 2024 for pyelonephritis.   ED workup reveled K+ 3.2, Total Co2 17, Albumin 1.7, Anion gap 17, Trop 35, LA 7.8, RVP negative. UA with leukocytes, protein, and bacteria. Blood cultures pending. CXR right base atelectasis, CT Head with NAICA with an unchanged 4-30mm colloid cyst. CT Chest Abd Pelvis with Right lower lobe consolidation concerning for PNA vs Asp PNA.   Called sister Charles [name] Psychologist, educational) who lives in [place] to discuss patients status. Charles [name] is concerned and explains that she feel that this was a rather sudden change from when she last talk to him on Friday (04/08/2022). She said she is aware of the patients wishes for not wanting any advanced life sustaining measures, but given the acute change, she would like to continue treating him, however, does not want any CPR in the event that his heart would stop. She is aware of the severity of [name]'s current state, and would like to given him the best chance of recovery as possible. Per notes on 03/14/2022, goals outlined where patient would prefer to die a natural death. Pt has been by palliative care back in  08/2021 where his GOC are outlined with NO feeding Tube.   PCCM consulted for further management.   Pertinent  Medical History   Past Medical History:  Diagnosis Date   Arthritis    Chronic diastolic heart failure (HCC)    Dementia (HCC)    Diabetes mellitus without complication ([place])    Heart disease    History of recurrent UTIs    (has chronic indwelling foley catheter)   Hypertension     Significant Hospital Events: Including procedures, antibiotic start and stop dates in addition to other pertinent events   04-26-22 - Arrived to [facility], unresponsive, hypotensive, intubated, CL placed, started on NE and Vaso. On Meropenem, Flagyl, Vancomycin 04/11/22 Added Epi gtt overnight 2/3 No acute issues overnight, remains on vent with pressor support  2/5 On full support this am, tachy, plan for one way extubation 2/5 at 1:30 pm 2/5 Candida per [facility] 1/29>> will give one time dose to treat  Interim History / Subjective:   Transition to comfort care 2/5, extubated and comfortable  Objective   Blood pressure 102/70, pulse (!) 133, temperature 98.9 F (37.2 C), temperature source Axillary, resp. rate 13, height 5\' 6"  (1.676 m), weight 51.3 kg, SpO2 100 %.    Vent Mode: PRVC FiO2 (%):  [30 %] 30 % Set Rate:  [18 bmp] 18 bmp Vt Set:  [510 mL] 510 mL PEEP:  [5 cmH20] 5 cmH20 Plateau Pressure:  [15 cmH20-17 cmH20] 15 cmH20   Intake/Output Summary (Last 24 hours) at 05/04/2022 2993 Last data filed at  05/09/2022 0300 Gross per 24 hour  Intake 1121.32 ml  Output 815 ml  Net 306.32 ml   Filed Weights   04/15/22 0541 04/16/22 0500 04/17/22 0500  Weight: 53.8 kg 50.4 kg 51.3 kg   Physical Examination: General: Chronically ill-appearing thin man.  No distress HEENT: No secretions Neuro: Unconscious, does not wake to voice CV: Tachycardic, distant, regular PULM: Coarse bilaterally GI: Nondistended Extremities: Thin, some muscle wasting Skin: No rash   Resolved Hospital Problem list    High anion gap metabolic acidosis Hyponatremia   Assessment & Plan:   Septic shock due ESBL E. coli bacteremia, likely from UTI, chronic indwelling Foley.  Also candidal fungemia.  Question possible aspiration pneumonia Acute hypoxic respiratory Failure  Anemia of chronic disease Thrombocytopenia of critical illness CAD s/p CABG Demand cardiac ischemia Hypokalemia Hypophosphatemia DM type II Severe malnutrition  -Continue comfort measures -Morphine for comfort -Robinul if needed -Will transition out of the ICU into a private room if he shows continued stability, adequate comfort    Best Practice (right click and "Reselect all SmartList Selections" daily)   Diet/type: NPO tube needs DVT prophylaxis: NA GI prophylaxis: N/A Lines: Central line Foley:  Yes, and it is still needed Code Status:  DNR  Critical care: NA    Baltazar Apo, MD, PhD 04/30/2022, 8:09 AM [place] [facility] [phone] or if no answer before 7:00PM call [phone] For any issues after 7:00PM please call eLink [phone]

## 2022-05-12 NOTE — Death Summary Note (Signed)
DEATH SUMMARY   Patient Details  Name: Charles [name] MRN: [id number] DOB: 1960-11-20  Admission/Discharge Information   Admit Date:  04/29/22  Date of Death: Date of Death: 05/07/2022  Time of Death: Time of Death: 07/03/01  Length of Stay: 8  Referring Physician: Tyrone Schimke., MD   Reason(s) for Hospitalization  Acute encephalopathy due to septic shock  Diagnoses  Preliminary cause of death:  Septic shock due to pneumonia and candidal fungemia  Secondary Diagnoses (including complications and co-morbidities):  Principal Problem:   Septic shock ([place]) Active Problems:   Hypertension   CAD S/p remote CABG   DM2 (diabetes mellitus, type 2) ([place])   HLD (hyperlipidemia)   Acute metabolic encephalopathy   Hypothermia   Pressure injury of skin   Protein-calorie malnutrition, severe (H[facility])   Delirium   Failure to thrive in adult   Dementia ([place])   Pressure injury of sacral region, stage 2 ([place])   Pneumonia of right lower lobe due to infectious organism Chronic diastolic CHF (HFpEF) 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 year old male with history of dementia and progressive neurological decline, chronic Foley catheter, chronic thrombocytopenia and anemia, diabetes mellitus, hypertension with chronic diastolic CHF, prior CAD/CABG.  He was found unresponsive and in shock at his skilled nursing facility admitted with shock, lactic acidosis, acute renal failure and evolving respiratory failure.  His imaging revealed right lower lobe  consolidation consistent with pneumonia.  He was treated with pressors, broad-spectrum antibiotics.  He required intubation and mechanical ventilation.  Blood cultures revealed Candida glabrata and antifungals were added.  Based on his overall debilitated state and poor prognosis for meaningful recovery decision was made by family to transition to comfort care.  This was done on 2022/05/07.  He expired with family at bedside later the same day.    Pertinent Labs and Studies  Significant Diagnostic Studies DG CHEST PORT 1 VIEW  Result Date: 04/12/2022 CLINICAL DATA:  PICC placement. EXAM: PORTABLE CHEST 1 VIEW COMPARISON:  04/11/2022 and CT chest 2022/04/29. FINDINGS: Endotracheal tube terminates approximately 3.2 cm above the carina. Left IJ central line tip is in the right atrium. Left PICC tip terminates below the mid left clavicle, in the region of the left subclavian vein. Nasogastric tube is followed into the stomach. Heart size normal.  Lungs are clear.  No pleural fluid. IMPRESSION: 1. Left PICC tip terminates in the region of the left subclavian vein. 2. No acute findings. Electronically Signed   By: Lorin Picket M.D.   On: 04/12/2022 16:04   ECHOCARDIOGRAM LIMITED  Result Date: 04/12/2022    ECHOCARDIOGRAM LIMITED REPORT   Patient Name:   Charles [name] Date of Exam: 04/12/2022 Medical Rec #:  [id number]     Height:       66.0 in Accession #:    [id number]    Weight:       102.1 lb Date of Birth:  February 17, 1961    BSA:          1.503 m Patient Age:    27 years      BP:           120/57 mmHg Patient  Gender: M             HR:           88 bpm. Exam Location:  Inpatient Procedure: Limited Echo and Limited Color Doppler Indications:    Dyspnea R06.00  History:        Patient has prior history of Echocardiogram examinations, most                 recent 03/24/2021. Prior CABG, Signs/Symptoms:Hypotension; Risk                 Factors:Hypertension, Diabetes and Dyslipidemia.  Sonographer:    Ronny Flurry  Referring Phys: Od[place]  1. Left ventricular ejection fraction, by estimation, is 65 to 70%. The left ventricle has normal function. The left ventricle has no regional wall motion abnormalities. Left ventricular diastolic parameters are consistent with Grade I diastolic dysfunction (impaired relaxation).  2. Right ventricular systolic function is normal. The right ventricular size is normal.  3. The mitral valve is normal in structure. No evidence of mitral valve regurgitation. No evidence of mitral stenosis.  4. The aortic valve is normal in structure. Aortic valve regurgitation is not visualized. No aortic stenosis is present.  5. The inferior vena cava is normal in size with <50% respiratory variability, suggesting right atrial pressure of 8 mmHg. FINDINGS  Left Ventricle: Left ventricular ejection fraction, by estimation, is 65 to 70%. The left ventricle has normal function. The left ventricle has no regional wall motion abnormalities. The left ventricular internal cavity size was normal in size. There is  no left ventricular hypertrophy. Left ventricular diastolic parameters are consistent with Grade I diastolic dysfunction (impaired relaxation). Indeterminate filling pressures. Right Ventricle: The right ventricular size is normal. No increase in right ventricular wall thickness. Right ventricular systolic function is normal. Mitral Valve: The mitral valve is normal in structure. No evidence of mitral valve stenosis. Tricuspid Valve: The tricuspid valve is not assessed. Aortic Valve: The aortic valve is normal in structure. Aortic valve regurgitation is not visualized. No aortic stenosis is present. Pulmonic Valve: The pulmonic valve was not assessed. Venous: The inferior vena cava is normal in size with less than 50% respiratory variability, suggesting right atrial pressure of 8 mmHg.  Diastology LV e' medial:    5.44 cm/s LV E/e' medial:  10.9 LV e' lateral:   6.37 cm/s LV E/e'  lateral: 9.3  RIGHT VENTRICLE RV S prime:     9.36 cm/s MITRAL VALVE MV Area (PHT): 3.65 cm MV Decel Time: 208 msec MV E velocity: 59.10 cm/s MV A velocity: 67.30 cm/s MV E/A ratio:  0.88 Skeet Latch MD Electronically signed by Skeet Latch MD Signature Date/Time: 04/12/2022/12:00:32 PM    Final    DG Chest Port 1 View  Result Date: 04/11/2022 CLINICAL DATA:  Encounter for acute respiratory failure EXAM: PORTABLE CHEST - 1 VIEW COMPARISON:  the previous day's study FINDINGS: Endotracheal tube and gastric tube and right arm PICC are stable in position. Coarse interstitial markings in the lung apices and left infrahilar region. No confluent airspace disease or overt edema. Heart size and mediastinal contours are within normal limits. CABG markers. No effusion. Sternotomy wires. IMPRESSION: 1. No acute cardiopulmonary findings. 2. Stable support hardware. Electronically Signed   By: Lucrezia Europe M.D.   On: 04/11/2022 08:04   DG Abd 1 View  Result Date: 03/17/2022 CLINICAL DATA:  Enteral nutrition EXAM: ABDOMEN - 1 VIEW COMPARISON:  CT abdomen/pelvis dated  03/31/2022 FINDINGS: Enteric tube terminates in the gastric antrum. Nonobstructive bowel gas pattern. Right groin catheter. Visualized osseous structures are within normal limits. IMPRESSION: Enteric tube terminates in the gastric antrum. Electronically Signed   By: Julian Hy M.D.   On: 04/12/2022 23:24   CT CHEST ABDOMEN PELVIS WO CONTRAST  Result Date: 04/04/2022 CLINICAL DATA:  Mental status change EXAM: CT CHEST, ABDOMEN AND PELVIS WITHOUT CONTRAST TECHNIQUE: Multidetector CT imaging of the chest, abdomen and pelvis was performed following the standard protocol without IV contrast. RADIATION DOSE REDUCTION: This exam was performed according to the departmental dose-optimization program which includes automated exposure control, adjustment of the mA and/or kV according to patient size and/or use of iterative reconstruction technique.  COMPARISON:  None Available. FINDINGS: CT CHEST FINDINGS Cardiovascular: Post CABG.  No acute cardiac findings Mediastinum/Nodes: Endotracheal tube in mid trachea. NG tube extends to the esophagus in the stomach. No mediastinal adenopathy. Lungs/Pleura: Band of consolidation in the RIGHT lower lobe. No pleural fluid. Upper lungs clear. Musculoskeletal: No aggressive osseous lesion. CT ABDOMEN AND PELVIS FINDINGS Hepatobiliary: No focal hepatic lesion. No biliary ductal dilatation. Gallbladder is normal. Common bile duct is normal. Pancreas: Pancreas is normal. No ductal dilatation. No pancreatic inflammation. Spleen: Normal spleen Adrenals/urinary tract: Adrenal glands normal. Mild perinephric stranding greater on the RIGHT. No RIGHT hydroureter. There several calculi within the lumen the bladder. Foley catheter in bladder. No ureterolithiasis identified. Stomach/Bowel: Stomach is markedly distended by gas. There is NG tube in stomach; however, the side port appears at the GE junction. Recommend advancing the NG tube several cm (4 cm) such that the side port is below the GE junction. No dilated loops of small bowel. Appendix not identified. Colon is relatively collapsed. Moderate volume stool in the sigmoid colon rectum. Vascular/Lymphatic: Abdominal aorta is normal caliber with atherosclerotic calcification. There is no retroperitoneal or periportal lymphadenopathy. No pelvic lymphadenopathy. Tiny focus of gas in the true a tiny focus of gas in the inferior vena cava (image 54/3) Reproductive: Prostate unremarkable Other: No intraperitoneal free air. Musculoskeletal: Comminuted fracture of the RIGHT femoral neck extensive soft tissue thickening about the fracture suggests chronic fracture. IMPRESSION: CHEST IMPRESSION: 1. Band of consolidation in the RIGHT lower lobe is concerning for pneumonia or aspiration pneumonia. 2. Endotracheal tube and NG tube in place. PELVIS IMPRESSION: 1. No evidence of bowel  obstruction. 2. Marked gaseous distension of the stomach. NG tube in place however the side port is at the GE junction. RECOMMEND ADVANCING NG TUBE SEVERAL CM (4 CM) such that the side port is below the GE junction. 3. Perinephric stranding on the RIGHT may relate to acute or resolving pyelonephritis or potential cleared ureteral obstruction. 4. Bladder calculi. 5. Comminuted fracture of the RIGHT femoral neck with extensive soft tissue thickening about the fracture suggests chronic fracture. 6.  Aortic Atherosclerosis (ICD10-I70.0). These results will be called to the ordering clinician or representative by the Radiologist Assistant, and communication documented in the PACS or [employer]. Electronically Signed   By: Suzy Bouchard M.D.   On: 04/07/2022 14:28   CT Head Wo Contrast  Result Date: 03/23/2022 CLINICAL DATA:  Altered mental status EXAM: CT HEAD WITHOUT CONTRAST TECHNIQUE: Contiguous axial images were obtained from the base of the skull through the vertex without intravenous contrast. RADIATION DOSE REDUCTION: This exam was performed according to the departmental dose-optimization program which includes automated exposure control, adjustment of the mA and/or kV according to patient size and/or use of iterative reconstruction technique.  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 The lateral and third ventricles are prominent, similar to multiple prior CTs and MRIs. There is no evidence of transependymal flow of CSF. 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 The pituitary and suprasellar region are normal. There is no solid mass lesion. There is no mass effect or midline shift. Vascular: There is calcification of the bilateral carotid siphons and vertebral arteries. Skull: Normal. Negative for  fracture or focal lesion. Sinuses/Orbits: There is mild mucosal thickening in the right maxillary sinus. Bilateral lens implants are in place. The globes and orbits are otherwise unremarkable. Other: None. IMPRESSION: 1. No acute intracranial pathology. 2. 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 Electronically Signed   By: Valetta Mole M.D.   On: 03/24/2022 14:22   DG Chest Port 1 View  Result Date: 04/06/2022 CLINICAL DATA:  Sepsis.  Ventilator dependence. EXAM: PORTABLE CHEST 1 VIEW COMPARISON:  01/26/2022 FINDINGS: 1147 hours. Endotracheal tube tip is approximately 4.8 cm above the base of the carina. The NG tube passes into the stomach although the distal tip position is not included on the film. Right PICC line tip overlies the mid SVC level. The cardiopericardial silhouette is within normal limits for size. Minimal atelectasis noted right base but lungs are otherwise clear. Prominent gastric bubble evident despite the presence of an NG tube. Telemetry leads overlie the chest. IMPRESSION: 1. Minimal right base atelectasis. 2. Prominent gastric bubble despite the presence of an NG tube. Electronically Signed   By: Misty Stanley M.D.   On: 04/08/2022 11:54    Microbiology Recent Results (from the past 240 hour(s))  Blood Culture (routine x 2)     Status: Abnormal   Collection Time: 04/09/2022 11:28 AM   Specimen: BLOOD  Result Value Ref Range Status   Specimen Description BLOOD RIGHT ANTECUBITAL  Final   Special Requests   Final    BOTTLES DRAWN AEROBIC AND ANAEROBIC Blood Culture adequate volume   Culture  Setup Time   Final    GRAM NEGATIVE RODS IN BOTH AEROBIC AND ANAEROBIC BOTTLES CRITICAL RESULT CALLED TO, READ BACK BY AND VERIFIED WITH: PHARMD C. AMEND [id number] @1936  FH Performed at [facility], [place] [address], [place], [place] [address]    Culture (A)  Final    ESCHERICHIA COLI Confirmed Extended Spectrum  Beta-Lactamase Producer (ESBL).  In bloodstream infections from ESBL organisms, carbapenems are preferred over piperacillin/tazobactam. They are shown to have a lower risk of mortality.    Report Status 04/15/2022 FINAL  Final   Organism ID, Bacteria ESCHERICHIA COLI  Final      Susceptibility   Escherichia coli - MIC*    AMPICILLIN >=32 RESISTANT Resistant     CEFAZOLIN >=64 RESISTANT Resistant     CEFEPIME 16 RESISTANT Resistant     CEFTAZIDIME RESISTANT Resistant     CEFTRIAXONE >=64 RESISTANT Resistant     CIPROFLOXACIN >=4 RESISTANT Resistant     GENTAMICIN <=1 SENSITIVE Sensitive     IMIPENEM <=0.25 SENSITIVE Sensitive     TRIMETH/SULFA >=320 RESISTANT Resistant     AMPICILLIN/SULBACTAM >=32 RESISTANT Resistant     PIP/TAZO 64 INTERMEDIATE Intermediate     * ESCHERICHIA COLI  Blood Culture ID Panel (Reflexed)  Status: Abnormal   Collection Time: 03/31/2022 11:28 AM  Result Value Ref Range Status   Enterococcus faecalis NOT DETECTED NOT DETECTED Final   Enterococcus Faecium NOT DETECTED NOT DETECTED Final   Listeria monocytogenes NOT DETECTED NOT DETECTED Final   Staphylococcus species NOT DETECTED NOT DETECTED Final   Staphylococcus aureus (BCID) NOT DETECTED NOT DETECTED Final   Staphylococcus epidermidis NOT DETECTED NOT DETECTED Final   Staphylococcus lugdunensis NOT DETECTED NOT DETECTED Final   Streptococcus species NOT DETECTED NOT DETECTED Final   Streptococcus agalactiae NOT DETECTED NOT DETECTED Final   Streptococcus pneumoniae NOT DETECTED NOT DETECTED Final   Streptococcus pyogenes NOT DETECTED NOT DETECTED Final   A.calcoaceticus-baumannii NOT DETECTED NOT DETECTED Final   Bacteroides fragilis NOT DETECTED NOT DETECTED Final   Enterobacterales DETECTED (A) NOT DETECTED Final    Comment: Enterobacterales represent a large order of gram negative bacteria, not a single organism. CRITICAL RESULT CALLED TO, READ BACK BY AND VERIFIED WITH: PHARMD C. AMEND [id number]  @1936  FH    Enterobacter cloacae complex NOT DETECTED NOT DETECTED Final   Escherichia coli DETECTED (A) NOT DETECTED Final    Comment: CRITICAL RESULT CALLED TO, READ BACK BY AND VERIFIED WITH: PHARMD C. AMEND [phone] @1936  FH    Klebsiella aerogenes NOT DETECTED NOT DETECTED Final   Klebsiella oxytoca NOT DETECTED NOT DETECTED Final   Klebsiella pneumoniae NOT DETECTED NOT DETECTED Final   Proteus species NOT DETECTED NOT DETECTED Final   Salmonella species NOT DETECTED NOT DETECTED Final   Serratia marcescens NOT DETECTED NOT DETECTED Final   Haemophilus influenzae NOT DETECTED NOT DETECTED Final   Neisseria meningitidis NOT DETECTED NOT DETECTED Final   Pseudomonas aeruginosa NOT DETECTED NOT DETECTED Final   Stenotrophomonas maltophilia NOT DETECTED NOT DETECTED Final   Candida albicans NOT DETECTED NOT DETECTED Final   Candida auris NOT DETECTED NOT DETECTED Final   Candida glabrata NOT DETECTED NOT DETECTED Final   Candida krusei NOT DETECTED NOT DETECTED Final   Candida parapsilosis NOT DETECTED NOT DETECTED Final   Candida tropicalis NOT DETECTED NOT DETECTED Final   Cryptococcus neoformans/gattii NOT DETECTED NOT DETECTED Final   CTX-M ESBL DETECTED (A) NOT DETECTED Final    Comment: CRITICAL RESULT CALLED TO, READ BACK BY AND VERIFIED WITH: PHARMD C. AMEND [id number] @1936  FH (NOTE) Extended spectrum beta-lactamase detected. Recommend a carbapenem as initial therapy.      Carbapenem resistance IMP NOT DETECTED NOT DETECTED Final   Carbapenem resistance KPC NOT DETECTED NOT DETECTED Final   Carbapenem resistance NDM NOT DETECTED NOT DETECTED Final   Carbapenem resist OXA 48 LIKE NOT DETECTED NOT DETECTED Final   Carbapenem resistance VIM NOT DETECTED NOT DETECTED Final    Comment: Performed at [facility], [place] [address], [place], [place] [address]  Resp panel by RT-PCR (RSV, Flu A&B, Covid) Anterior Nasal Swab     Status: None   Collection Time: 03/15/2022 11:40 AM    Specimen: Anterior Nasal Swab  Result Value Ref Range Status   SARS Coronavirus 2 by RT PCR NEGATIVE NEGATIVE Final    Comment: (NOTE) SARS-CoV-2 target nucleic acids are NOT DETECTED.  The SARS-CoV-2 RNA is generally detectable in upper respiratory specimens during the acute phase of infection. The lowest concentration of SARS-CoV-2 viral copies this assay can detect is 138 copies/mL. A negative result does not preclude SARS-Cov-2 infection and should not be used as the sole basis for treatment or other patient management decisions. A negative result may  occur with  improper specimen collection/handling, submission of specimen other than nasopharyngeal swab, presence of viral mutation(s) within the areas targeted by this assay, and inadequate number of viral copies(<138 copies/mL). A negative result must be combined with clinical observations, patient history, and epidemiological information. The expected result is Negative.  Fact Sheet for Patients:  EntrepreneurPulse.com.au  Fact Sheet for Healthcare Providers:  IncredibleEmployment.be  This test is no t yet approved or cleared by the [place] FDA and  has been authorized for detection and/or diagnosis of SARS-CoV-2 by FDA under an Emergency Use Authorization (EUA). This EUA will remain  in effect (meaning this test can be used) for the duration of the COVID-19 declaration under Section 564(b)(1) of the Act, 21 U.S.C.section 360bbb-3(b)(1), unless the authorization is terminated  or revoked sooner.       Influenza A by PCR NEGATIVE NEGATIVE Final   Influenza B by PCR NEGATIVE NEGATIVE Final    Comment: (NOTE) The Xpert Xpress SARS-CoV-2/FLU/RSV plus assay is intended as an aid in the diagnosis of influenza from Nasopharyngeal swab specimens and should not be used as a sole basis for treatment. Nasal washings and aspirates are unacceptable for Xpert Xpress  SARS-CoV-2/FLU/RSV testing.  Fact Sheet for Patients: EntrepreneurPulse.com.au  Fact Sheet for Healthcare Providers: IncredibleEmployment.be  This test is not yet approved or cleared by the [place] FDA and has been authorized for detection and/or diagnosis of SARS-CoV-2 by FDA under an Emergency Use Authorization (EUA). This EUA will remain in effect (meaning this test can be used) for the duration of the COVID-19 declaration under Section 564(b)(1) of the Act, 21 U.S.C. section 360bbb-3(b)(1), unless the authorization is terminated or revoked.     Resp Syncytial Virus by PCR NEGATIVE NEGATIVE Final    Comment: (NOTE) Fact Sheet for Patients: EntrepreneurPulse.com.au  Fact Sheet for Healthcare Providers: IncredibleEmployment.be  This test is not yet approved or cleared by the [place] FDA and has been authorized for detection and/or diagnosis of SARS-CoV-2 by FDA under an Emergency Use Authorization (EUA). This EUA will remain in effect (meaning this test can be used) for the duration of the COVID-19 declaration under Section 564(b)(1) of the Act, 21 U.S.C. section 360bbb-3(b)(1), unless the authorization is terminated or revoked.  Performed at [facility], [place] [address], [place], [place] [address]   Culture, blood (Routine X 2) w Reflex to ID Panel     Status: Abnormal (Preliminary result)   Collection Time: 03/29/2022  7:04 PM   Specimen: BLOOD RIGHT HAND  Result Value Ref Range Status   Specimen Description BLOOD RIGHT HAND  Final   Special Requests   Final    BOTTLES DRAWN AEROBIC ONLY Blood Culture results may not be optimal due to an inadequate volume of blood received in culture bottles   Culture  Setup Time   Final    YEAST AEROBIC BOTTLE ONLY CRITICAL RESULT CALLED TO, READ BACK BY AND VERIFIED WITH: M PHAM,[id number]  04/17/22 [place]    Culture (A)  Final    CANDIDA  GLABRATA Sent to [place] for further susceptibility testing. Performed at [facility], [place] [address] [place], [place] [address]    Report Status PENDING  Incomplete  Blood Culture ID Panel (Reflexed)     Status: Abnormal   Collection Time: 03/24/2022  7:04 PM  Result Value Ref Range Status   Enterococcus faecalis NOT DETECTED NOT DETECTED Final   Enterococcus Faecium NOT DETECTED NOT DETECTED Final   Listeria monocytogenes NOT  DETECTED NOT DETECTED Final   Staphylococcus species NOT DETECTED NOT DETECTED Final   Staphylococcus aureus (BCID) NOT DETECTED NOT DETECTED Final   Staphylococcus epidermidis NOT DETECTED NOT DETECTED Final   Staphylococcus lugdunensis NOT DETECTED NOT DETECTED Final   Streptococcus species NOT DETECTED NOT DETECTED Final   Streptococcus agalactiae NOT DETECTED NOT DETECTED Final   Streptococcus pneumoniae NOT DETECTED NOT DETECTED Final   Streptococcus pyogenes NOT DETECTED NOT DETECTED Final   A.calcoaceticus-baumannii NOT DETECTED NOT DETECTED Final   Bacteroides fragilis NOT DETECTED NOT DETECTED Final   Enterobacterales NOT DETECTED NOT DETECTED Final   Enterobacter cloacae complex NOT DETECTED NOT DETECTED Final   Escherichia coli NOT DETECTED NOT DETECTED Final   Klebsiella aerogenes NOT DETECTED NOT DETECTED Final   Klebsiella oxytoca NOT DETECTED NOT DETECTED Final   Klebsiella pneumoniae NOT DETECTED NOT DETECTED Final   Proteus species NOT DETECTED NOT DETECTED Final   Salmonella species NOT DETECTED NOT DETECTED Final   Serratia marcescens NOT DETECTED NOT DETECTED Final   Haemophilus influenzae NOT DETECTED NOT DETECTED Final   Neisseria meningitidis NOT DETECTED NOT DETECTED Final   Pseudomonas aeruginosa NOT DETECTED NOT DETECTED Final   Stenotrophomonas maltophilia NOT DETECTED NOT DETECTED Final   Candida albicans NOT DETECTED NOT DETECTED Final   Candida auris NOT DETECTED NOT DETECTED Final   Candida glabrata DETECTED (A) NOT  DETECTED Final    Comment: CRITICAL RESULT CALLED TO, READ BACK BY AND VERIFIED WITH: M PHAM,[email or web address]@[id number]  04/17/22 [place]    Candida krusei NOT DETECTED NOT DETECTED Final   Candida parapsilosis NOT DETECTED NOT DETECTED Final   Candida tropicalis NOT DETECTED NOT DETECTED Final   Cryptococcus neoformans/gattii NOT DETECTED NOT DETECTED Final    Comment: Performed at [facility], [address], [place], [place] [address]  Culture, blood (Routine X 2) w Reflex to ID Panel     Status: None (Preliminary result)   Collection Time: 04/16/22 10:29 AM   Specimen: BLOOD LEFT HAND  Result Value Ref Range Status   Specimen Description BLOOD LEFT HAND  Final   Special Requests   Final    BOTTLES DRAWN AEROBIC AND ANAEROBIC Blood Culture adequate volume   Culture   Final    NO GROWTH 3 DAYS Performed at [facility], [place] [address] [place], [place] [address]    Report Status PENDING  Incomplete  Culture, blood (Routine X 2) w Reflex to ID Panel     Status: None (Preliminary result)   Collection Time: 04/16/22 10:33 AM   Specimen: BLOOD  Result Value Ref Range Status   Specimen Description BLOOD RIGHT ANTECUBITAL  Final   Special Requests   Final    BOTTLES DRAWN AEROBIC AND ANAEROBIC Blood Culture adequate volume   Culture   Final    NO GROWTH 3 DAYS Performed at [facility], [place] [address], [place], [place] [address]    Report Status PENDING  Incomplete    Lab Basic Metabolic Panel: Recent Labs  Lab 04/14/22 1635 04/15/22 0327 04/15/22 1140 04/15/22 1748 04/16/22 0320 04/16/22 1640 04/17/22 0338  NA  --  142 140 141 140 139  --   K  --  3.7 3.3* 3.3* 3.5 3.6 3.7  CL  --  112* 109 106 101 102  --   CO2  --  24 25 27 30 30   --   GLUCOSE  --  198* 188* 202* 235* 219*  --   BUN  --  36* 35* 35* 32* 35*  --   CREATININE  --  0.73 0.70 0.66 0.73 0.81  --   CALCIUM  --  7.3* 7.3* 7.6* 8.1* 7.9*  --   MG 1.9 2.0  --  2.3 1.9  --  2.3  PHOS 2.7 2.3*  --  2.3* 2.7   --  1.9*   Liver Function Tests: Recent Labs  Lab 04/14/22 1313  AST 157*  ALT 44  ALKPHOS 246*  BILITOT 1.1  PROT 4.0*  ALBUMIN 1.7*   No results for input(s): "LIPASE", "AMYLASE" in the last 168 hours. No results for input(s): "AMMONIA" in the last 168 hours. CBC: Recent Labs  Lab 04/14/22 0352 04/14/22 1313 04/14/22 2035 04/15/22 0327 04/16/22 0320 04/17/22 0338  WBC 28.0* 27.5*  --  21.8* 24.0* 31.7*  NEUTROABS  --  24.7*  --   --   --   --   HGB 7.0* 7.5* 10.0* 10.1* 7.7* 9.9*  HCT 21.4* 22.3* 28.9* 29.8* 22.0* 29.3*  MCV 79.3* 78.5*  --  81.2 80.0 82.1  PLT 23* 23*  --  19* 24* 57*   Cardiac Enzymes: No results for input(s): "CKTOTAL", "CKMB", "CKMBINDEX", "TROPONINI" in the last 168 hours. Sepsis Labs: Recent Labs  Lab 04/14/22 1313 04/15/22 0327 04/16/22 0320 04/17/22 0338  WBC 27.5* 21.8* 24.0* 31.7*      Zvi Duplantis S [place] Noyce 04/19/2022, 3:18 PM

## 2022-05-12 DEATH — deceased
